# Patient Record
Sex: Male | Born: 1940
Health system: Southern US, Community
[De-identification: ages and names within clinical notes are randomized; demographics above are authoritative.]

## PROBLEM LIST (undated history)

## (undated) DIAGNOSIS — G43909 Migraine, unspecified, not intractable, without status migrainosus: Secondary | ICD-10-CM

## (undated) DIAGNOSIS — E119 Type 2 diabetes mellitus without complications: Secondary | ICD-10-CM

## (undated) DIAGNOSIS — E559 Vitamin D deficiency, unspecified: Secondary | ICD-10-CM

## (undated) DIAGNOSIS — M1990 Unspecified osteoarthritis, unspecified site: Secondary | ICD-10-CM

## (undated) DIAGNOSIS — G4730 Sleep apnea, unspecified: Secondary | ICD-10-CM

## (undated) DIAGNOSIS — I509 Heart failure, unspecified: Secondary | ICD-10-CM

## (undated) DIAGNOSIS — I071 Rheumatic tricuspid insufficiency: Secondary | ICD-10-CM

## (undated) DIAGNOSIS — E7800 Pure hypercholesterolemia, unspecified: Secondary | ICD-10-CM

## (undated) DIAGNOSIS — I2510 Atherosclerotic heart disease of native coronary artery without angina pectoris: Secondary | ICD-10-CM

## (undated) DIAGNOSIS — Z9989 Dependence on other enabling machines and devices: Secondary | ICD-10-CM

## (undated) DIAGNOSIS — K635 Polyp of colon: Secondary | ICD-10-CM

## (undated) DIAGNOSIS — K760 Fatty (change of) liver, not elsewhere classified: Secondary | ICD-10-CM

## (undated) DIAGNOSIS — N400 Enlarged prostate without lower urinary tract symptoms: Secondary | ICD-10-CM

## (undated) DIAGNOSIS — D509 Iron deficiency anemia, unspecified: Secondary | ICD-10-CM

## (undated) DIAGNOSIS — I4891 Unspecified atrial fibrillation: Secondary | ICD-10-CM

## (undated) DIAGNOSIS — I700 Atherosclerosis of aorta: Secondary | ICD-10-CM

## (undated) DIAGNOSIS — I10 Essential (primary) hypertension: Secondary | ICD-10-CM

## (undated) DIAGNOSIS — I714 Abdominal aortic aneurysm, without rupture, unspecified: Secondary | ICD-10-CM

## (undated) DIAGNOSIS — Z923 Personal history of irradiation: Secondary | ICD-10-CM

## (undated) DIAGNOSIS — I739 Peripheral vascular disease, unspecified: Secondary | ICD-10-CM

## (undated) DIAGNOSIS — M109 Gout, unspecified: Secondary | ICD-10-CM

## (undated) DIAGNOSIS — K219 Gastro-esophageal reflux disease without esophagitis: Secondary | ICD-10-CM

## (undated) DIAGNOSIS — I499 Cardiac arrhythmia, unspecified: Secondary | ICD-10-CM

## (undated) DIAGNOSIS — R011 Cardiac murmur, unspecified: Secondary | ICD-10-CM

## (undated) DIAGNOSIS — N200 Calculus of kidney: Secondary | ICD-10-CM

## (undated) DIAGNOSIS — M069 Rheumatoid arthritis, unspecified: Secondary | ICD-10-CM

## (undated) DIAGNOSIS — Z7901 Long term (current) use of anticoagulants: Secondary | ICD-10-CM

## (undated) DIAGNOSIS — Z950 Presence of cardiac pacemaker: Secondary | ICD-10-CM

## (undated) DIAGNOSIS — F419 Anxiety disorder, unspecified: Secondary | ICD-10-CM

## (undated) DIAGNOSIS — K5790 Diverticulosis of intestine, part unspecified, without perforation or abscess without bleeding: Secondary | ICD-10-CM

## (undated) DIAGNOSIS — N189 Chronic kidney disease, unspecified: Secondary | ICD-10-CM

## (undated) DIAGNOSIS — N281 Cyst of kidney, acquired: Secondary | ICD-10-CM

## (undated) DIAGNOSIS — N183 Chronic kidney disease, stage 3 unspecified: Secondary | ICD-10-CM

## (undated) DIAGNOSIS — N529 Male erectile dysfunction, unspecified: Secondary | ICD-10-CM

## (undated) DIAGNOSIS — C801 Malignant (primary) neoplasm, unspecified: Secondary | ICD-10-CM

## (undated) DIAGNOSIS — C9000 Multiple myeloma not having achieved remission: Secondary | ICD-10-CM

## (undated) DIAGNOSIS — I5189 Other ill-defined heart diseases: Secondary | ICD-10-CM

## (undated) DIAGNOSIS — G4733 Obstructive sleep apnea (adult) (pediatric): Secondary | ICD-10-CM

## (undated) DIAGNOSIS — J449 Chronic obstructive pulmonary disease, unspecified: Secondary | ICD-10-CM

## (undated) DIAGNOSIS — I495 Sick sinus syndrome: Secondary | ICD-10-CM

## (undated) DIAGNOSIS — K759 Inflammatory liver disease, unspecified: Secondary | ICD-10-CM

## (undated) DIAGNOSIS — C61 Malignant neoplasm of prostate: Secondary | ICD-10-CM

## (undated) DIAGNOSIS — K7689 Other specified diseases of liver: Secondary | ICD-10-CM

## (undated) HISTORY — PX: OTHER SURGICAL HISTORY: SHX169

## (undated) HISTORY — PX: TONSILLECTOMY: SUR1361

## (undated) HISTORY — PX: NASAL SINUS SURGERY: SHX719

## (undated) HISTORY — DX: Malignant (primary) neoplasm, unspecified: C80.1

## (undated) HISTORY — DX: Malignant neoplasm of prostate: C61

## (undated) HISTORY — PX: PROSTATE BIOPSY: SHX241

## (undated) HISTORY — DX: Polyp of colon: K63.5

## (undated) SURGERY — EGD (ESOPHAGOGASTRODUODENOSCOPY)
Anesthesia: General

---

## 2009-06-30 ENCOUNTER — Emergency Department: Payer: Self-pay | Admitting: Emergency Medicine

## 2011-02-11 DIAGNOSIS — M5030 Other cervical disc degeneration, unspecified cervical region: Secondary | ICD-10-CM | POA: Insufficient documentation

## 2011-02-11 DIAGNOSIS — M130 Polyarthritis, unspecified: Secondary | ICD-10-CM | POA: Insufficient documentation

## 2013-10-03 ENCOUNTER — Ambulatory Visit: Payer: Self-pay | Admitting: Vascular Surgery

## 2013-10-31 ENCOUNTER — Ambulatory Visit: Payer: Self-pay | Admitting: Vascular Surgery

## 2013-10-31 DIAGNOSIS — I10 Essential (primary) hypertension: Secondary | ICD-10-CM

## 2013-10-31 DIAGNOSIS — I2510 Atherosclerotic heart disease of native coronary artery without angina pectoris: Secondary | ICD-10-CM

## 2013-10-31 LAB — URINALYSIS, COMPLETE
BILIRUBIN, UR: NEGATIVE
Bacteria: NONE SEEN
Blood: NEGATIVE
Glucose,UR: NEGATIVE mg/dL (ref 0–75)
Ketone: NEGATIVE
NITRITE: NEGATIVE
Ph: 5 (ref 4.5–8.0)
Protein: NEGATIVE
RBC,UR: 1 /HPF (ref 0–5)
SQUAMOUS EPITHELIAL: NONE SEEN
Specific Gravity: 1.015 (ref 1.003–1.030)
WBC UR: 9 /HPF (ref 0–5)

## 2013-10-31 LAB — CBC
HCT: 51 % (ref 40.0–52.0)
HGB: 16.9 g/dL (ref 13.0–18.0)
MCH: 32.8 pg (ref 26.0–34.0)
MCHC: 33.1 g/dL (ref 32.0–36.0)
MCV: 99 fL (ref 80–100)
Platelet: 193 10*3/uL (ref 150–440)
RBC: 5.15 10*6/uL (ref 4.40–5.90)
RDW: 15.6 % — ABNORMAL HIGH (ref 11.5–14.5)
WBC: 8.8 10*3/uL (ref 3.8–10.6)

## 2013-10-31 LAB — BASIC METABOLIC PANEL
Anion Gap: 3 — ABNORMAL LOW (ref 7–16)
BUN: 19 mg/dL — ABNORMAL HIGH (ref 7–18)
CALCIUM: 8.7 mg/dL (ref 8.5–10.1)
Chloride: 109 mmol/L — ABNORMAL HIGH (ref 98–107)
Co2: 27 mmol/L (ref 21–32)
Creatinine: 1.37 mg/dL — ABNORMAL HIGH (ref 0.60–1.30)
GFR CALC AF AMER: 60 — AB
GFR CALC NON AF AMER: 52 — AB
Glucose: 82 mg/dL (ref 65–99)
Osmolality: 279 (ref 275–301)
POTASSIUM: 4.1 mmol/L (ref 3.5–5.1)
Sodium: 139 mmol/L (ref 136–145)

## 2013-11-05 HISTORY — PX: ABDOMINAL AORTIC ANEURYSM REPAIR: SUR1152

## 2013-11-07 ENCOUNTER — Inpatient Hospital Stay: Payer: Self-pay | Admitting: Vascular Surgery

## 2013-11-08 LAB — CBC WITH DIFFERENTIAL/PLATELET
Basophil #: 0 10*3/uL (ref 0.0–0.1)
Basophil %: 0.2 %
Eosinophil #: 0.2 10*3/uL (ref 0.0–0.7)
Eosinophil %: 1.7 %
HCT: 45 % (ref 40.0–52.0)
HGB: 15 g/dL (ref 13.0–18.0)
LYMPHS ABS: 1.2 10*3/uL (ref 1.0–3.6)
Lymphocyte %: 10.6 %
MCH: 33.2 pg (ref 26.0–34.0)
MCHC: 33.2 g/dL (ref 32.0–36.0)
MCV: 100 fL (ref 80–100)
MONOS PCT: 9.1 %
Monocyte #: 1 x10 3/mm (ref 0.2–1.0)
NEUTROS ABS: 8.8 10*3/uL — AB (ref 1.4–6.5)
NEUTROS PCT: 78.4 %
Platelet: 132 10*3/uL — ABNORMAL LOW (ref 150–440)
RBC: 4.51 10*6/uL (ref 4.40–5.90)
RDW: 14.8 % — ABNORMAL HIGH (ref 11.5–14.5)
WBC: 11.2 10*3/uL — ABNORMAL HIGH (ref 3.8–10.6)

## 2013-11-08 LAB — COMPREHENSIVE METABOLIC PANEL
ALK PHOS: 71 U/L
ALT: 27 U/L (ref 12–78)
AST: 22 U/L (ref 15–37)
Albumin: 3 g/dL — ABNORMAL LOW (ref 3.4–5.0)
Anion Gap: 7 (ref 7–16)
BUN: 13 mg/dL (ref 7–18)
Bilirubin,Total: 0.5 mg/dL (ref 0.2–1.0)
CHLORIDE: 110 mmol/L — AB (ref 98–107)
Calcium, Total: 8.1 mg/dL — ABNORMAL LOW (ref 8.5–10.1)
Co2: 24 mmol/L (ref 21–32)
Creatinine: 1.02 mg/dL (ref 0.60–1.30)
Glucose: 110 mg/dL — ABNORMAL HIGH (ref 65–99)
OSMOLALITY: 282 (ref 275–301)
Potassium: 4 mmol/L (ref 3.5–5.1)
SODIUM: 141 mmol/L (ref 136–145)
Total Protein: 6.4 g/dL (ref 6.4–8.2)

## 2013-11-08 LAB — PHOSPHORUS: Phosphorus: 3.4 mg/dL (ref 2.5–4.9)

## 2013-11-08 LAB — PROTIME-INR
INR: 1.1
PROTHROMBIN TIME: 13.7 s (ref 11.5–14.7)

## 2013-11-08 LAB — APTT: ACTIVATED PTT: 32.4 s (ref 23.6–35.9)

## 2013-11-08 LAB — MAGNESIUM: MAGNESIUM: 1.4 mg/dL — AB

## 2014-01-15 DIAGNOSIS — M069 Rheumatoid arthritis, unspecified: Secondary | ICD-10-CM | POA: Insufficient documentation

## 2014-01-15 DIAGNOSIS — Z8739 Personal history of other diseases of the musculoskeletal system and connective tissue: Secondary | ICD-10-CM | POA: Insufficient documentation

## 2014-01-28 ENCOUNTER — Ambulatory Visit: Payer: Self-pay | Admitting: Rheumatology

## 2014-02-18 ENCOUNTER — Encounter: Payer: Self-pay | Admitting: *Deleted

## 2014-02-19 ENCOUNTER — Encounter: Payer: Self-pay | Admitting: Radiation Oncology

## 2014-02-19 NOTE — Progress Notes (Signed)
GU Location of Tumor / Histology: prostate adenocarcinoma  If Prostate Cancer, Gleason Score is (3 + 3) and PSA is (7.59 on 01/30/14) 09/17/13 PSA 7.65 05/25/13 PSA 8.76  Omar [name] presented  12 months ago with signs/symptoms of: elevated PSA of 7.3  Biopsies of prostate revealed:  02/01/14 volume 41.42 cc    01/01/13  Volume 46.6 cc   Past/Anticipated interventions by urology, if any: surveillance, biopsy x 2  Past/Anticipated interventions by medical oncology, if any: no  Weight changes, if any: no  Bowel/Bladder complaints, if any:  IPSS 10, incomplete;ete emptying, frequency, weak stream, nocturia x 1  Nausea/Vomiting, if any: no  Pain issues, if any:  no  SAFETY ISSUES:  Prior radiation? no  Pacemaker/ICD? no  Possible current pregnancy? na  Is the patient on methotrexate? 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

## 2014-02-20 ENCOUNTER — Encounter: Payer: Self-pay | Admitting: Radiation Oncology

## 2014-02-20 ENCOUNTER — Ambulatory Visit
Admission: RE | Admit: 2014-02-20 | Discharge: 2014-02-20 | Disposition: A | Discharge status: Home / Self Care | Payer: Medicare Other | Source: Ambulatory Visit | Attending: Radiation Oncology | Admitting: Radiation Oncology

## 2014-02-20 VITALS — BP 119/78 | HR 79 | Temp 98.4°F | Resp 20 | Ht 71.0 in | Wt 234.6 lb

## 2014-02-20 DIAGNOSIS — R3911 Hesitancy of micturition: Secondary | ICD-10-CM | POA: Insufficient documentation

## 2014-02-20 DIAGNOSIS — Z79899 Other long term (current) drug therapy: Secondary | ICD-10-CM | POA: Diagnosis not present

## 2014-02-20 DIAGNOSIS — I4891 Unspecified atrial fibrillation: Secondary | ICD-10-CM | POA: Insufficient documentation

## 2014-02-20 DIAGNOSIS — Z791 Long term (current) use of non-steroidal anti-inflammatories (NSAID): Secondary | ICD-10-CM | POA: Insufficient documentation

## 2014-02-20 DIAGNOSIS — R350 Frequency of micturition: Secondary | ICD-10-CM | POA: Insufficient documentation

## 2014-02-20 DIAGNOSIS — Z7982 Long term (current) use of aspirin: Secondary | ICD-10-CM | POA: Diagnosis not present

## 2014-02-20 DIAGNOSIS — C61 Malignant neoplasm of prostate: Secondary | ICD-10-CM | POA: Insufficient documentation

## 2014-02-20 DIAGNOSIS — N529 Male erectile dysfunction, unspecified: Secondary | ICD-10-CM | POA: Insufficient documentation

## 2014-02-20 DIAGNOSIS — I10 Essential (primary) hypertension: Secondary | ICD-10-CM | POA: Insufficient documentation

## 2014-02-20 DIAGNOSIS — M069 Rheumatoid arthritis, unspecified: Secondary | ICD-10-CM | POA: Insufficient documentation

## 2014-02-20 DIAGNOSIS — E7800 Pure hypercholesterolemia, unspecified: Secondary | ICD-10-CM | POA: Insufficient documentation

## 2014-02-20 DIAGNOSIS — F17200 Nicotine dependence, unspecified, uncomplicated: Secondary | ICD-10-CM | POA: Insufficient documentation

## 2014-02-20 DIAGNOSIS — K219 Gastro-esophageal reflux disease without esophagitis: Secondary | ICD-10-CM | POA: Insufficient documentation

## 2014-02-20 DIAGNOSIS — Z510 Encounter for antineoplastic radiation therapy: Secondary | ICD-10-CM | POA: Diagnosis not present

## 2014-02-20 DIAGNOSIS — M47817 Spondylosis without myelopathy or radiculopathy, lumbosacral region: Secondary | ICD-10-CM | POA: Diagnosis not present

## 2014-02-20 DIAGNOSIS — F411 Generalized anxiety disorder: Secondary | ICD-10-CM | POA: Diagnosis not present

## 2014-02-20 HISTORY — DX: Anxiety disorder, unspecified: F41.9

## 2014-02-20 HISTORY — DX: Unspecified atrial fibrillation: I48.91

## 2014-02-20 HISTORY — DX: Sleep apnea, unspecified: G47.30

## 2014-02-20 HISTORY — DX: Essential (primary) hypertension: I10

## 2014-02-20 HISTORY — DX: Unspecified osteoarthritis, unspecified site: M19.90

## 2014-02-20 HISTORY — DX: Cardiac murmur, unspecified: R01.1

## 2014-02-20 HISTORY — DX: Rheumatoid arthritis, unspecified: M06.9

## 2014-02-20 HISTORY — DX: Pure hypercholesterolemia, unspecified: E78.00

## 2014-02-20 HISTORY — DX: Gastro-esophageal reflux disease without esophagitis: K21.9

## 2014-02-20 NOTE — Progress Notes (Signed)
Please see the Nurse Progress Note in the MD Initial Consult Encounter for this patient. 

## 2014-02-20 NOTE — Progress Notes (Signed)
[facility] NEW PATIENT EVALUATION  Name: Omar [name] MRN: [id number]  Date:   02/20/2014           DOB: 10-30-1940  Status: outpatient   CC: No primary provider on file.  Dahlstedt, Lillette Boxer, MD    REFERRING PHYSICIAN: Dahlstedt, Lillette Boxer, MD   DIAGNOSIS: Stage TI C. intermediate risk adenocarcinoma prostate   HISTORY OF PRESENT ILLNESS:  Omar [name] is a 73 y.o. male who is seen today through the courtesy of Dr. Diona Fanti for consideration of radiation therapy in the management of his stage TI C. intermediate risk adenocarcinoma prostate. He presented with a PSA of 7.3 and underwent prostate biopsies on 01/01/2013. His prostate volume was 46.6 cc. Biopsies came back positive for adenocarcinoma, all Gleason 6 (3+3). He had 5% involvement of one core from left lateral base, 50% involvement of one core from left lateral mid gland and 5% of one core from the right lateral apex. He elected for observation. A followup PSA was 7.6 on 01/30/2014.  Repeat biopsies on 01/30/2014 revealed Gleason 7 (3+4) involving 5% of one core from the left base, 90% of one core from the left lateral mid gland and 30% of one core from the left lateral apex. He also had Gleason 6 (3+3) involving 10% of one core from left lateral base. His gland volume is 41.4 cc. He is now interested in potential curative therapy. He is doing recently well from a GU and GI standpoint. His I PSS score today is 10. He does report urinary hesitancy and frequency. He does have erectile dysfunction which in the past did not respond well to PDE 5 inhibitors. He seen today with his daughter. Of note is that he did have a recent MRI scan of his lumbar spine for back pain on 01/28/2014. He was found to have moderately severe central canal stenosis with spondylosis at the L4-5 level. No evidence for metastatic disease.   PREVIOUS RADIATION THERAPY: No   PAST MEDICAL HISTORY:  has a past medical history of  Anxiety; GERD (gastroesophageal reflux disease); Hypertension; Heart murmur; Hypercholesterolemia; Sleep apnea; Prostate cancer (01/01/13, 01/30/14); Arthritis; Atrial fibrillation; and Rheumatoid arthritis.     PAST SURGICAL HISTORY:  Past Surgical History  Procedure Laterality Date  . Tonsillectomy    . Prostate biopsy  01/01/13, 01/30/14    Gleason 3+3=6, vol 46.6 cc  . Abdominal aortic aneurysm repair  11/2013     FAMILY HISTORY: family history includes Cancer in his brother; Cirrhosis in his father; Heart attack in his mother. His mother died of heart attack in her 29s. His father died from alcoholic cirrhosis and his 72s. No family history of prostate cancer.   SOCIAL HISTORY:  reports that he has been smoking.  He does not have any smokeless tobacco history on file. He reports that he drinks alcohol. He reports that he does not use illicit drugs. Divorced, 5 children. 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   ALLERGIES: Review of patient's allergies indicates no known allergies.   MEDICATIONS:  Current Outpatient Prescriptions  Medication Sig Dispense Refill  . ALPRAZolam (XANAX) 0.25 MG tablet Take 0.25 mg by mouth at bedtime as needed for anxiety.      Marland Kitchen amLODipine-benazepril (LOTREL) 5-20 MG per capsule Take 1 capsule by mouth daily.      Marland Kitchen aspirin 81 MG tablet Take 81 mg by mouth daily.      Marland Kitchen atorvastatin (LIPITOR) 80 MG tablet Take  80 mg by mouth daily.      . busPIRone (BUSPAR) 5 MG tablet Take 5 mg by mouth 3 (three) times daily.      . Butalbital-Acetaminophen (BUPAP) 50-300 MG TABS Take by mouth.      . celecoxib (CELEBREX) 200 MG capsule Take 200 mg by mouth 2 (two) times daily.      . cyclobenzaprine (FLEXERIL) 5 MG tablet Take 5 mg by mouth 3 (three) times daily as needed for muscle spasms.      . fluticasone (FLONASE) 50 MCG/ACT nasal spray Place into both nostrils daily.      . Fluticasone Furoate-Vilanterol (BREO ELLIPTA) 100-25 MCG/INH AEPB Inhale into the lungs.       . folic acid (FOLVITE) 1 MG tablet Take 1 mg by mouth daily.      . isosorbide mononitrate (IMDUR) 30 MG 24 hr tablet Take 30 mg by mouth daily.      . methotrexate (RHEUMATREX) 2.5 MG tablet Take by mouth.      . metoprolol succinate (TOPROL-XL) 25 MG 24 hr tablet Take 25 mg by mouth daily.      . pantoprazole (PROTONIX) 20 MG tablet Take 20 mg by mouth daily.       No current facility-administered medications for this encounter.     REVIEW OF SYSTEMS:  Pertinent items are noted in HPI.    PHYSICAL EXAM:  height is 5\' 11"  (1.803 m) and weight is 234 lb 9.6 oz (106.414 kg). His oral temperature is 98.4 F (36.9 C). His blood pressure is 119/78 and his pulse is 79. His respiration is 20.   Alert and oriented 73 year old African American male appearing his stated age. Head and neck examination: Grossly unremarkable. Nodes: Without palpable cervical or supraclavicular lymphadenopathy. Chest: Lungs clear. Back: Without spinal or CVA tenderness. Abdomen: Without hepatomegaly. Genitalia: Unremarkable to inspection. Rectal: The prostate gland is normal in size and is without focal induration or nodularity. Extremities: Without edema. Skin: He appears to have vitiligo.   LABORATORY DATA:  No results found for this basename: WBC, HGB, HCT, MCV, PLT   No results found for this basename: NA, K, CL, CO2   No results found for this basename: ALT, AST, GGT, ALKPHOS, BILITOT   PSA 7.59 from 01/30/2014   IMPRESSION: Stage TI C. intermediate risk adenocarcinoma prostate. I explained to the patient and his daughter that his prognosis is related to his stage, PSA level, and Gleason score. His stage and PSA level are favorable while his Gleason score of 7 is of intermediate favorability. We discussed surgery versus continued active surveillance versus radiation therapy. Radiation therapy options include seed implantation with or without 5 weeks of external beam or 8 weeks of external beam/IMRT. With 3  biopsy sites for Gleason 7, I do not feel that seed implantation alone would be ideal. He may be better suited for 8 weeks of external beam/IMRT considering his age and pathologic findings. We discussed the potential acute and late toxicities of radiation therapy. After lengthy discussion he is most interested in 8 weeks of external beam/IMRT compared to 5 weeks of external beam followed by a seed implant boost. We talked about treatment with a comfortably full bladder to reduce urinary toxicity. Consent is signed today. We need to have Dr. Diona Fanti place 3 gold seed markers for image guidance, and then we can have him return for CT simulation.   PLAN: As discussed above  I spent 60 minutes face to face with the patient  and more than 50% of that time was spent in counseling and/or coordination of care.

## 2014-02-21 ENCOUNTER — Telehealth: Payer: Self-pay | Admitting: *Deleted

## 2014-02-21 NOTE — Telephone Encounter (Signed)
CALLED PATIENT TO INFORM OF GOLD SEED PLACEMENT DATE OF 04-17-14 - ARRIVAL TIME 2:45 PM @ DR. DAHLSTEDT'S OFFICE, I HAVE LVM A MESSAAGE FOR DR. DAHLSTEDT'S NURSE TO GIVE ME A CALL TO SEE IF I CAN GET AN EARLIER DATE, LVM FOR A RETURN CALL

## 2014-02-25 ENCOUNTER — Telehealth: Payer: Self-pay | Admitting: *Deleted

## 2014-02-25 NOTE — Telephone Encounter (Signed)
Called patient to inform of gold seed placement on 03-21-14 - arrival time - 9:15 am @ Dr. Alan Ripper Office and his sim on 03-25-14 @ 9 am @ Dr. Charlton Amor Office, lvm for a return call

## 2014-03-22 ENCOUNTER — Telehealth: Payer: Self-pay | Admitting: *Deleted

## 2014-03-22 NOTE — Telephone Encounter (Signed)
CALLED PATIENT TO REMIND OF SIM APPT. FOR 03-25-14 @ 9 AM, LVM FOR A RETURN CALL

## 2014-03-25 ENCOUNTER — Ambulatory Visit
Admission: RE | Admit: 2014-03-25 | Discharge: 2014-03-25 | Disposition: A | Discharge status: Home / Self Care | Payer: Medicare Other | Source: Ambulatory Visit | Attending: Radiation Oncology | Admitting: Radiation Oncology

## 2014-03-25 DIAGNOSIS — Z510 Encounter for antineoplastic radiation therapy: Secondary | ICD-10-CM | POA: Insufficient documentation

## 2014-03-25 DIAGNOSIS — C61 Malignant neoplasm of prostate: Secondary | ICD-10-CM

## 2014-03-25 NOTE — Progress Notes (Signed)
Complex simulation/treatment planning note: The patient was taken to the CT simulator. He was placed supine. A VAC LOC immobilization device was constructed. A red rubber catheter was placed within the rectal vault. He was then catheterized and contrast instilled into the bladder/urethra. He was then scanned. I chose an isocenter in the center of the prostate. The CT data set was sent to the MIM planning system I contoured his prostate (GTV), seminal vesicles, bladder, rectum, and distal rectosigmoid colon. I am prescribing 7800 cGy to his prostate PTV which represents the prostate was 0.8 cm except for 0.5 cm along the rectum. I prescribing 5600 cGy in 40 sessions to his seminal vesicle PTV which  represents his seminal vesicles plus 0.5 cm. He is now ready for IMRT simulation/treatment planning.

## 2014-03-28 DIAGNOSIS — Z510 Encounter for antineoplastic radiation therapy: Secondary | ICD-10-CM | POA: Diagnosis not present

## 2014-04-02 ENCOUNTER — Encounter: Payer: Self-pay | Admitting: Radiation Oncology

## 2014-04-02 DIAGNOSIS — Z510 Encounter for antineoplastic radiation therapy: Secondary | ICD-10-CM | POA: Diagnosis not present

## 2014-04-02 NOTE — Progress Notes (Signed)
IMRT simulation/treatment planning note: The patient completed his IMRT treatment planning today and the management of his carcinoma of the prostate. IMRT was chosen to decrease the risk for both acute and late bladder and rectal toxicity compared to conventional or 3-D conformal radiation therapy. Dose volume histograms were obtained for the target structures including the prostate PTV and seminal vesicle PTV in addition to avoidance structures including the bladder, rectum, and femoral heads. We met our departmental guidelines. I'm prescribing 7800 cGy in 40 sessions to his prostate PTV and 5600 cGy in 40 sessions to his seminal vesical PTV. He is being treated with dual arc VMAT IMRT.

## 2014-04-03 ENCOUNTER — Ambulatory Visit
Admission: RE | Admit: 2014-04-03 | Discharge: 2014-04-03 | Disposition: A | Discharge status: Home / Self Care | Payer: Medicare Other | Source: Ambulatory Visit | Attending: Radiation Oncology | Admitting: Radiation Oncology

## 2014-04-03 DIAGNOSIS — C61 Malignant neoplasm of prostate: Secondary | ICD-10-CM

## 2014-04-03 DIAGNOSIS — Z510 Encounter for antineoplastic radiation therapy: Secondary | ICD-10-CM | POA: Diagnosis not present

## 2014-04-03 NOTE — Progress Notes (Signed)
Patient education completed with patient , wife and daughter. Gave him "Radiation and You" booklet with all pertinent information marked and discussed, re: rectal irritation/care, fatigue, urinary/bladder irritation/management, nutrition, pain. All questions answered; pt and family verbalized understanding.

## 2014-04-04 ENCOUNTER — Ambulatory Visit
Admission: RE | Admit: 2014-04-04 | Discharge: 2014-04-04 | Disposition: A | Discharge status: Home / Self Care | Payer: Medicare Other | Source: Ambulatory Visit | Attending: Radiation Oncology | Admitting: Radiation Oncology

## 2014-04-04 DIAGNOSIS — Z510 Encounter for antineoplastic radiation therapy: Secondary | ICD-10-CM | POA: Diagnosis not present

## 2014-04-05 ENCOUNTER — Ambulatory Visit
Admission: RE | Admit: 2014-04-05 | Discharge: 2014-04-05 | Disposition: A | Discharge status: Home / Self Care | Payer: Medicare Other | Source: Ambulatory Visit | Attending: Radiation Oncology | Admitting: Radiation Oncology

## 2014-04-05 DIAGNOSIS — Z510 Encounter for antineoplastic radiation therapy: Secondary | ICD-10-CM | POA: Diagnosis not present

## 2014-04-08 ENCOUNTER — Inpatient Hospital Stay
Admission: RE | Admit: 2014-04-08 | Discharge: 2014-04-08 | Disposition: A | Discharge status: Home / Self Care | Payer: Self-pay | Source: Ambulatory Visit | Attending: Radiation Oncology | Admitting: Radiation Oncology

## 2014-04-08 ENCOUNTER — Ambulatory Visit
Admission: RE | Admit: 2014-04-08 | Discharge: 2014-04-08 | Disposition: A | Discharge status: Home / Self Care | Payer: Medicare Other | Source: Ambulatory Visit | Attending: Radiation Oncology | Admitting: Radiation Oncology

## 2014-04-08 ENCOUNTER — Encounter: Payer: Self-pay | Admitting: Radiation Oncology

## 2014-04-08 VITALS — BP 125/68 | HR 49 | Resp 20 | Wt 239.0 lb

## 2014-04-08 DIAGNOSIS — C61 Malignant neoplasm of prostate: Secondary | ICD-10-CM

## 2014-04-08 DIAGNOSIS — Z510 Encounter for antineoplastic radiation therapy: Secondary | ICD-10-CM | POA: Diagnosis not present

## 2014-04-08 NOTE — Progress Notes (Signed)
Weekly Management Note:  Site:prostate Current Dose:  780  cGy Projected Dose: 7800  cGy  Narrative: The patient is seen today for routine under treatment assessment. CBCT/MVCT images/port films were reviewed. The chart was reviewed.   Bladder filling is satisfactory. No new GU or GI difficulties.  Physical Examination:  Filed Vitals:   04/08/14 0930  BP: 125/68  Pulse: 49  Resp: 20  .  Weight: 239 lb (108.41 kg). No change.  Impression: Tolerating radiation therapy well.  Plan: Continue radiation therapy as planned.

## 2014-04-08 NOTE — Progress Notes (Signed)
Patient denies pain, fatigue, loss of appetite, bowel issues. He states he "may have some increased urinary frequency". Pt states he is still smoking but wants to quit. Gave him [facility] support team Nov 2015 calendar with smoking cessation info marked. Also gave him 1-800-quit now as an alternative for assistance.

## 2014-04-09 ENCOUNTER — Ambulatory Visit: Admission: RE | Admit: 2014-04-09 | Payer: Medicare Other | Source: Ambulatory Visit

## 2014-04-10 ENCOUNTER — Ambulatory Visit
Admission: RE | Admit: 2014-04-10 | Discharge: 2014-04-10 | Disposition: A | Discharge status: Home / Self Care | Payer: Medicare Other | Source: Ambulatory Visit | Attending: Radiation Oncology | Admitting: Radiation Oncology

## 2014-04-10 DIAGNOSIS — Z510 Encounter for antineoplastic radiation therapy: Secondary | ICD-10-CM | POA: Diagnosis not present

## 2014-04-11 ENCOUNTER — Ambulatory Visit
Admission: RE | Admit: 2014-04-11 | Discharge: 2014-04-11 | Disposition: A | Discharge status: Home / Self Care | Payer: Medicare Other | Source: Ambulatory Visit | Attending: Radiation Oncology | Admitting: Radiation Oncology

## 2014-04-11 DIAGNOSIS — Z510 Encounter for antineoplastic radiation therapy: Secondary | ICD-10-CM | POA: Diagnosis not present

## 2014-04-12 ENCOUNTER — Ambulatory Visit
Admission: RE | Admit: 2014-04-12 | Discharge: 2014-04-12 | Disposition: A | Discharge status: Home / Self Care | Payer: Medicare Other | Source: Ambulatory Visit | Attending: Radiation Oncology | Admitting: Radiation Oncology

## 2014-04-12 DIAGNOSIS — Z510 Encounter for antineoplastic radiation therapy: Secondary | ICD-10-CM | POA: Diagnosis not present

## 2014-04-15 ENCOUNTER — Encounter: Payer: Self-pay | Admitting: Radiation Oncology

## 2014-04-15 ENCOUNTER — Ambulatory Visit
Admission: RE | Admit: 2014-04-15 | Discharge: 2014-04-15 | Disposition: A | Discharge status: Home / Self Care | Payer: Medicare Other | Source: Ambulatory Visit | Attending: Radiation Oncology | Admitting: Radiation Oncology

## 2014-04-15 VITALS — BP 126/66 | HR 49 | Temp 98.2°F | Resp 16 | Ht 71.0 in | Wt 236.1 lb

## 2014-04-15 DIAGNOSIS — Z510 Encounter for antineoplastic radiation therapy: Secondary | ICD-10-CM | POA: Diagnosis not present

## 2014-04-15 DIAGNOSIS — C61 Malignant neoplasm of prostate: Secondary | ICD-10-CM

## 2014-04-15 NOTE — Progress Notes (Signed)
Omar [name] has completed 8 fractions to his prostate.  He denies pain.  He reports a slight increase in urinary frequency.  He reports getting up 1 time per night to urinate.   He reports difficulty starting his urinary stream first thing in the morning.  This started 1-2 weeks ago.  He denies dysuria, hematuria, diarrhea, skin irritation and fatigue.  His HR today was 49.  He is taking metoprolol and denies any dizziness.

## 2014-04-15 NOTE — Progress Notes (Signed)
   Weekly Management Note:  outpatient    ICD-9-CM ICD-10-CM   1. Malignant neoplasm of prostate 185 C61     Current Dose:  15.6 Gy  Projected Dose: 78 Gy   Narrative:  The patient presents for routine under treatment assessment.  CBCT/MVCT images/Port film x-rays were reviewed.  The chart was checked. No new complaints  Physical Findings:  height is 5\' 11"  (1.803 m) and weight is 236 lb 1.6 oz (107.094 kg). His oral temperature is 98.2 F (36.8 C). His blood pressure is 126/66 and his pulse is 49. His respiration is 16.  NAD  Impression:  The patient is tolerating radiotherapy.  Plan:  Continue radiotherapy as planned.  ________________________________   Eppie Gibson, M.D.

## 2014-04-16 ENCOUNTER — Ambulatory Visit
Admission: RE | Admit: 2014-04-16 | Discharge: 2014-04-16 | Disposition: A | Discharge status: Home / Self Care | Payer: Medicare Other | Source: Ambulatory Visit | Attending: Radiation Oncology | Admitting: Radiation Oncology

## 2014-04-16 DIAGNOSIS — Z510 Encounter for antineoplastic radiation therapy: Secondary | ICD-10-CM | POA: Diagnosis not present

## 2014-04-17 ENCOUNTER — Ambulatory Visit
Admission: RE | Admit: 2014-04-17 | Discharge: 2014-04-17 | Disposition: A | Discharge status: Home / Self Care | Payer: Medicare Other | Source: Ambulatory Visit | Attending: Radiation Oncology | Admitting: Radiation Oncology

## 2014-04-17 DIAGNOSIS — Z510 Encounter for antineoplastic radiation therapy: Secondary | ICD-10-CM | POA: Diagnosis not present

## 2014-04-18 ENCOUNTER — Ambulatory Visit
Admission: RE | Admit: 2014-04-18 | Discharge: 2014-04-18 | Disposition: A | Discharge status: Home / Self Care | Payer: Medicare Other | Source: Ambulatory Visit | Attending: Radiation Oncology | Admitting: Radiation Oncology

## 2014-04-18 DIAGNOSIS — Z510 Encounter for antineoplastic radiation therapy: Secondary | ICD-10-CM | POA: Diagnosis not present

## 2014-04-19 ENCOUNTER — Ambulatory Visit
Admission: RE | Admit: 2014-04-19 | Discharge: 2014-04-19 | Disposition: A | Discharge status: Home / Self Care | Payer: Medicare Other | Source: Ambulatory Visit | Attending: Radiation Oncology | Admitting: Radiation Oncology

## 2014-04-19 DIAGNOSIS — Z510 Encounter for antineoplastic radiation therapy: Secondary | ICD-10-CM | POA: Diagnosis not present

## 2014-04-22 ENCOUNTER — Ambulatory Visit
Admission: RE | Admit: 2014-04-22 | Discharge: 2014-04-22 | Disposition: A | Discharge status: Home / Self Care | Payer: Medicare Other | Source: Ambulatory Visit | Attending: Radiation Oncology | Admitting: Radiation Oncology

## 2014-04-22 ENCOUNTER — Encounter: Payer: Self-pay | Admitting: Radiation Oncology

## 2014-04-22 VITALS — BP 139/71 | HR 62 | Temp 97.5°F | Resp 20 | Wt 236.8 lb

## 2014-04-22 DIAGNOSIS — C61 Malignant neoplasm of prostate: Secondary | ICD-10-CM

## 2014-04-22 DIAGNOSIS — Z510 Encounter for antineoplastic radiation therapy: Secondary | ICD-10-CM | POA: Diagnosis not present

## 2014-04-22 NOTE — Progress Notes (Signed)
Weekly Management Note:  Site:prostate Current Dose:  3835  cGy Projected Dose: 7800  cGy  Narrative: The patient is seen today for routine under treatment assessment. CBCT/MVCT images/port films were reviewed. The chart was reviewed.   Prostate filling is satisfactory. He does report some slowing of his urinary stream and difficulty emptying his bladder. He is not on an alpha blocker.  Physical Examination:  Filed Vitals:   04/22/14 1052  BP: 139/71  Pulse: 62  Temp: 97.5 F (36.4 C)  Resp: 20  .  Weight: 236 lb 12.8 oz (107.412 kg). No change.  Impression: Tolerating radiation therapy well, except for some slowing of his urinary stream. We will hold off on starting an alpha blocker this week.  Plan: Continue radiation therapy as planned.

## 2014-04-22 NOTE — Progress Notes (Signed)
Patient denies pain, fatigue, loss of appetite, bowel issues. He states he has noticed that in mornings he has difficulty initiating his stream. He denies dysuria, straining, weak stream , incomplete emptying.

## 2014-04-23 ENCOUNTER — Ambulatory Visit
Admission: RE | Admit: 2014-04-23 | Discharge: 2014-04-23 | Disposition: A | Discharge status: Home / Self Care | Payer: Medicare Other | Source: Ambulatory Visit | Attending: Radiation Oncology | Admitting: Radiation Oncology

## 2014-04-23 DIAGNOSIS — Z510 Encounter for antineoplastic radiation therapy: Secondary | ICD-10-CM | POA: Diagnosis not present

## 2014-04-24 ENCOUNTER — Ambulatory Visit
Admission: RE | Admit: 2014-04-24 | Discharge: 2014-04-24 | Disposition: A | Discharge status: Home / Self Care | Payer: Medicare Other | Source: Ambulatory Visit | Attending: Radiation Oncology | Admitting: Radiation Oncology

## 2014-04-24 DIAGNOSIS — Z510 Encounter for antineoplastic radiation therapy: Secondary | ICD-10-CM | POA: Diagnosis not present

## 2014-04-25 ENCOUNTER — Ambulatory Visit
Admission: RE | Admit: 2014-04-25 | Discharge: 2014-04-25 | Disposition: A | Discharge status: Home / Self Care | Payer: Medicare Other | Source: Ambulatory Visit | Attending: Radiation Oncology | Admitting: Radiation Oncology

## 2014-04-25 DIAGNOSIS — Z510 Encounter for antineoplastic radiation therapy: Secondary | ICD-10-CM | POA: Diagnosis not present

## 2014-04-26 ENCOUNTER — Ambulatory Visit
Admission: RE | Admit: 2014-04-26 | Discharge: 2014-04-26 | Disposition: A | Discharge status: Home / Self Care | Payer: Medicare Other | Source: Ambulatory Visit | Attending: Radiation Oncology | Admitting: Radiation Oncology

## 2014-04-26 DIAGNOSIS — Z510 Encounter for antineoplastic radiation therapy: Secondary | ICD-10-CM | POA: Diagnosis not present

## 2014-04-28 ENCOUNTER — Ambulatory Visit
Admission: RE | Admit: 2014-04-28 | Discharge: 2014-04-28 | Disposition: A | Discharge status: Home / Self Care | Payer: Medicare Other | Source: Ambulatory Visit | Attending: Radiation Oncology | Admitting: Radiation Oncology

## 2014-04-28 DIAGNOSIS — Z510 Encounter for antineoplastic radiation therapy: Secondary | ICD-10-CM | POA: Diagnosis not present

## 2014-04-29 ENCOUNTER — Ambulatory Visit
Admission: RE | Admit: 2014-04-29 | Discharge: 2014-04-29 | Disposition: A | Discharge status: Home / Self Care | Payer: Medicare Other | Source: Ambulatory Visit | Attending: Radiation Oncology | Admitting: Radiation Oncology

## 2014-04-29 VITALS — BP 141/72 | HR 62 | Temp 97.4°F | Wt 236.3 lb

## 2014-04-29 DIAGNOSIS — Z510 Encounter for antineoplastic radiation therapy: Secondary | ICD-10-CM | POA: Diagnosis not present

## 2014-04-29 DIAGNOSIS — C61 Malignant neoplasm of prostate: Secondary | ICD-10-CM

## 2014-04-29 NOTE — Progress Notes (Signed)
Weekly Management Note:  Site: Prostate Current Dose:  3705  cGy Projected Dose: 7800  cGy  Narrative: The patient is seen today for routine under treatment assessment. CBCT/MVCT images/port films were reviewed. The chart was reviewed.   Bladder filling satisfactory.  No significant GU or GI difficulty.  Physical Examination:  Filed Vitals:   04/29/14 1044  BP: 141/72  Pulse: 62  Temp: 97.4 F (36.3 C)  .  Weight: 236 lb 4.8 oz (107.185 kg).  No change.  Impression: Tolerating radiation therapy well.  Plan: Continue radiation therapy as planned.

## 2014-04-29 NOTE — Progress Notes (Signed)
Patient for weekly assessment of radiation to prostate.denies pain. Completed 19 of 40 treatments.Frequency and urgency of urination.No bowel changes.Mild fatigue.

## 2014-04-29 NOTE — Addendum Note (Signed)
Encounter addended by: Andria Rhein, RN on: 04/29/2014 11:23 AM<BR>     Documentation filed: Demographics Visit

## 2014-04-30 ENCOUNTER — Ambulatory Visit
Admission: RE | Admit: 2014-04-30 | Discharge: 2014-04-30 | Disposition: A | Discharge status: Home / Self Care | Payer: Medicare Other | Source: Ambulatory Visit | Attending: Radiation Oncology | Admitting: Radiation Oncology

## 2014-04-30 DIAGNOSIS — Z510 Encounter for antineoplastic radiation therapy: Secondary | ICD-10-CM | POA: Diagnosis not present

## 2014-05-01 ENCOUNTER — Ambulatory Visit
Admission: RE | Admit: 2014-05-01 | Discharge: 2014-05-01 | Disposition: A | Discharge status: Home / Self Care | Payer: Medicare Other | Source: Ambulatory Visit | Attending: Radiation Oncology | Admitting: Radiation Oncology

## 2014-05-01 DIAGNOSIS — Z510 Encounter for antineoplastic radiation therapy: Secondary | ICD-10-CM | POA: Diagnosis not present

## 2014-05-03 ENCOUNTER — Ambulatory Visit: Payer: Medicare Other

## 2014-05-06 ENCOUNTER — Ambulatory Visit
Admission: RE | Admit: 2014-05-06 | Discharge: 2014-05-06 | Disposition: A | Discharge status: Home / Self Care | Payer: Medicare Other | Source: Ambulatory Visit | Attending: Radiation Oncology | Admitting: Radiation Oncology

## 2014-05-06 ENCOUNTER — Encounter: Payer: Self-pay | Admitting: Radiation Oncology

## 2014-05-06 VITALS — BP 134/71 | HR 61 | Resp 22 | Wt 234.0 lb

## 2014-05-06 DIAGNOSIS — Z510 Encounter for antineoplastic radiation therapy: Secondary | ICD-10-CM | POA: Diagnosis not present

## 2014-05-06 DIAGNOSIS — C61 Malignant neoplasm of prostate: Secondary | ICD-10-CM

## 2014-05-06 NOTE — Progress Notes (Signed)
Weekly Management Note:  Site: Prostate  Current Dose:  4290  cGy Projected Dose: 7800  cGy  Narrative: The patient is seen today for routine under treatment assessment. CBCT/MVCT images/port films were reviewed. The chart was reviewed.   Bladder filling is satisfactory.  No new GU or GI difficulties.  Physical Examination:  Filed Vitals:   05/06/14 1029  BP: 134/71  Pulse: 61  Resp: 22  .  Weight: 234 lb (106.142 kg).  No change.  Impression: Tolerating radiation therapy well.  Plan: Continue radiation therapy as planned.

## 2014-05-06 NOTE — Progress Notes (Signed)
Patient denies pain, urinary/bowel issues, loss of appetite. He states he was fatigued last week.

## 2014-05-07 ENCOUNTER — Ambulatory Visit
Admission: RE | Admit: 2014-05-07 | Discharge: 2014-05-07 | Disposition: A | Discharge status: Home / Self Care | Payer: Medicare Other | Source: Ambulatory Visit | Attending: Radiation Oncology | Admitting: Radiation Oncology

## 2014-05-07 DIAGNOSIS — Z510 Encounter for antineoplastic radiation therapy: Secondary | ICD-10-CM | POA: Diagnosis not present

## 2014-05-08 ENCOUNTER — Ambulatory Visit
Admission: RE | Admit: 2014-05-08 | Discharge: 2014-05-08 | Disposition: A | Discharge status: Home / Self Care | Payer: Medicare Other | Source: Ambulatory Visit | Attending: Radiation Oncology | Admitting: Radiation Oncology

## 2014-05-08 DIAGNOSIS — Z510 Encounter for antineoplastic radiation therapy: Secondary | ICD-10-CM | POA: Diagnosis not present

## 2014-05-09 ENCOUNTER — Ambulatory Visit
Admission: RE | Admit: 2014-05-09 | Discharge: 2014-05-09 | Disposition: A | Discharge status: Home / Self Care | Payer: Medicare Other | Source: Ambulatory Visit | Attending: Radiation Oncology | Admitting: Radiation Oncology

## 2014-05-09 DIAGNOSIS — Z510 Encounter for antineoplastic radiation therapy: Secondary | ICD-10-CM | POA: Diagnosis not present

## 2014-05-10 ENCOUNTER — Ambulatory Visit: Payer: Medicare Other

## 2014-05-13 ENCOUNTER — Ambulatory Visit
Admission: RE | Admit: 2014-05-13 | Discharge: 2014-05-13 | Disposition: A | Discharge status: Home / Self Care | Payer: Medicare Other | Source: Ambulatory Visit | Attending: Radiation Oncology | Admitting: Radiation Oncology

## 2014-05-13 ENCOUNTER — Encounter: Payer: Self-pay | Admitting: Radiation Oncology

## 2014-05-13 VITALS — BP 138/67 | HR 58 | Temp 97.5°F | Resp 20 | Wt 235.6 lb

## 2014-05-13 DIAGNOSIS — C61 Malignant neoplasm of prostate: Secondary | ICD-10-CM

## 2014-05-13 DIAGNOSIS — Z510 Encounter for antineoplastic radiation therapy: Secondary | ICD-10-CM | POA: Diagnosis not present

## 2014-05-13 NOTE — Progress Notes (Signed)
Patient denies pain, fatigue, urinary/bowel issues, loss of appetite.

## 2014-05-13 NOTE — Progress Notes (Signed)
Weekly Management Note:  Site: Prostate Current Dose:  5070  cGy Projected Dose: 7800  cGy  Narrative: The patient is seen today for routine under treatment assessment. CBCT/MVCT images/port films were reviewed. The chart was reviewed.   Bladder filling is satisfactory.  No significant GU or GI difficulties.  Physical Examination:  Filed Vitals:   05/13/14 1039  BP: 138/67  Pulse: 58  Temp: 97.5 F (36.4 C)  Resp: 20  .  Weight: 235 lb 9.6 oz (106.867 kg).  No change.  Impression: Tolerating radiation therapy well.  Plan: Continue radiation therapy as planned.

## 2014-05-14 ENCOUNTER — Ambulatory Visit
Admission: RE | Admit: 2014-05-14 | Discharge: 2014-05-14 | Disposition: A | Discharge status: Home / Self Care | Payer: Medicare Other | Source: Ambulatory Visit | Attending: Radiation Oncology | Admitting: Radiation Oncology

## 2014-05-14 DIAGNOSIS — Z510 Encounter for antineoplastic radiation therapy: Secondary | ICD-10-CM | POA: Diagnosis not present

## 2014-05-15 ENCOUNTER — Ambulatory Visit
Admission: RE | Admit: 2014-05-15 | Discharge: 2014-05-15 | Disposition: A | Discharge status: Home / Self Care | Payer: Medicare Other | Source: Ambulatory Visit | Attending: Radiation Oncology | Admitting: Radiation Oncology

## 2014-05-15 DIAGNOSIS — Z510 Encounter for antineoplastic radiation therapy: Secondary | ICD-10-CM | POA: Diagnosis not present

## 2014-05-16 ENCOUNTER — Ambulatory Visit
Admission: RE | Admit: 2014-05-16 | Discharge: 2014-05-16 | Disposition: A | Discharge status: Home / Self Care | Payer: Medicare Other | Source: Ambulatory Visit | Attending: Radiation Oncology | Admitting: Radiation Oncology

## 2014-05-16 DIAGNOSIS — Z510 Encounter for antineoplastic radiation therapy: Secondary | ICD-10-CM | POA: Diagnosis not present

## 2014-05-17 ENCOUNTER — Ambulatory Visit
Admission: RE | Admit: 2014-05-17 | Discharge: 2014-05-17 | Disposition: A | Discharge status: Home / Self Care | Payer: Medicare Other | Source: Ambulatory Visit | Attending: Radiation Oncology | Admitting: Radiation Oncology

## 2014-05-17 DIAGNOSIS — Z510 Encounter for antineoplastic radiation therapy: Secondary | ICD-10-CM | POA: Diagnosis not present

## 2014-05-20 ENCOUNTER — Ambulatory Visit
Admission: RE | Admit: 2014-05-20 | Discharge: 2014-05-20 | Disposition: A | Discharge status: Home / Self Care | Payer: Medicare Other | Source: Ambulatory Visit | Attending: Radiation Oncology | Admitting: Radiation Oncology

## 2014-05-20 VITALS — BP 135/65 | HR 44 | Temp 97.6°F | Resp 18 | Wt 235.1 lb

## 2014-05-20 DIAGNOSIS — Z510 Encounter for antineoplastic radiation therapy: Secondary | ICD-10-CM | POA: Diagnosis not present

## 2014-05-20 DIAGNOSIS — C61 Malignant neoplasm of prostate: Secondary | ICD-10-CM

## 2014-05-20 NOTE — Progress Notes (Signed)
Patient denies pain, loss of appetite, urinary/bowel issues. He states he has slight fatigue.

## 2014-05-20 NOTE — Progress Notes (Signed)
Weekly Management Note:  Site: Prostate Current Dose:  6045  cGy Projected Dose: 7800  cGy  Narrative: The patient is seen today for routine under treatment assessment. CBCT/MVCT images/port films were reviewed. The chart was reviewed.   Bladder filling is satisfactory.  No new GU or GI difficulties.  Physical Examination:  Filed Vitals:   05/20/14 1053  BP: 135/65  Pulse: 44  Temp: 97.6 F (36.4 C)  Resp: 18  .  Weight: 235 lb 1.6 oz (106.641 kg).  No change.  Impression: Tolerating radiation therapy well.  Plan: Continue radiation therapy as planned.

## 2014-05-21 ENCOUNTER — Ambulatory Visit
Admission: RE | Admit: 2014-05-21 | Discharge: 2014-05-21 | Disposition: A | Discharge status: Home / Self Care | Payer: Medicare Other | Source: Ambulatory Visit | Attending: Radiation Oncology | Admitting: Radiation Oncology

## 2014-05-21 DIAGNOSIS — Z510 Encounter for antineoplastic radiation therapy: Secondary | ICD-10-CM | POA: Diagnosis not present

## 2014-05-22 ENCOUNTER — Ambulatory Visit
Admission: RE | Admit: 2014-05-22 | Discharge: 2014-05-22 | Disposition: A | Discharge status: Home / Self Care | Payer: Medicare Other | Source: Ambulatory Visit | Attending: Radiation Oncology | Admitting: Radiation Oncology

## 2014-05-22 DIAGNOSIS — Z510 Encounter for antineoplastic radiation therapy: Secondary | ICD-10-CM | POA: Diagnosis not present

## 2014-05-23 ENCOUNTER — Ambulatory Visit
Admission: RE | Admit: 2014-05-23 | Discharge: 2014-05-23 | Disposition: A | Discharge status: Home / Self Care | Payer: Medicare Other | Source: Ambulatory Visit | Attending: Radiation Oncology | Admitting: Radiation Oncology

## 2014-05-23 DIAGNOSIS — Z510 Encounter for antineoplastic radiation therapy: Secondary | ICD-10-CM | POA: Diagnosis not present

## 2014-05-24 ENCOUNTER — Ambulatory Visit
Admission: RE | Admit: 2014-05-24 | Discharge: 2014-05-24 | Disposition: A | Discharge status: Home / Self Care | Payer: Medicare Other | Source: Ambulatory Visit | Attending: Radiation Oncology | Admitting: Radiation Oncology

## 2014-05-24 DIAGNOSIS — Z510 Encounter for antineoplastic radiation therapy: Secondary | ICD-10-CM | POA: Diagnosis not present

## 2014-05-27 ENCOUNTER — Ambulatory Visit
Admission: RE | Admit: 2014-05-27 | Discharge: 2014-05-27 | Disposition: A | Discharge status: Home / Self Care | Payer: Medicare Other | Source: Ambulatory Visit | Attending: Radiation Oncology | Admitting: Radiation Oncology

## 2014-05-27 ENCOUNTER — Encounter: Payer: Self-pay | Admitting: Radiation Oncology

## 2014-05-27 VITALS — BP 148/81 | HR 55 | Temp 97.5°F | Resp 20 | Wt 237.4 lb

## 2014-05-27 DIAGNOSIS — C61 Malignant neoplasm of prostate: Secondary | ICD-10-CM

## 2014-05-27 DIAGNOSIS — Z510 Encounter for antineoplastic radiation therapy: Secondary | ICD-10-CM | POA: Diagnosis not present

## 2014-05-27 NOTE — Progress Notes (Signed)
Patient denies pain, fatigue, loss of appetite, urinary or bowel issues.

## 2014-05-27 NOTE — Progress Notes (Signed)
Weekly Management Note:  Site: Prostate Current Dose:  7000 cGy Projected Dose: 7800  cGy  Narrative: The patient is seen today for routine under treatment assessment. CBCT/MVCT images/port films were reviewed. The chart was reviewed.    No new complaints  Physical Examination:  Filed Vitals:   05/27/14 1003  BP: 148/81  Pulse: 55  Temp: 97.5 F (36.4 C)  Resp: 20  .  Weight: 237 lb 6.4 oz (107.684 kg).  NAD  Impression: Tolerating radiation therapy well.  Plan: Continue radiation therapy as planned. -----------------------------------  Eppie Gibson, MD

## 2014-05-28 ENCOUNTER — Ambulatory Visit
Admission: RE | Admit: 2014-05-28 | Discharge: 2014-05-28 | Disposition: A | Discharge status: Home / Self Care | Payer: Medicare Other | Source: Ambulatory Visit | Attending: Radiation Oncology | Admitting: Radiation Oncology

## 2014-05-28 DIAGNOSIS — Z510 Encounter for antineoplastic radiation therapy: Secondary | ICD-10-CM | POA: Diagnosis not present

## 2014-05-29 ENCOUNTER — Ambulatory Visit
Admission: RE | Admit: 2014-05-29 | Discharge: 2014-05-29 | Disposition: A | Discharge status: Home / Self Care | Payer: Medicare Other | Source: Ambulatory Visit | Attending: Radiation Oncology | Admitting: Radiation Oncology

## 2014-05-29 DIAGNOSIS — Z510 Encounter for antineoplastic radiation therapy: Secondary | ICD-10-CM | POA: Diagnosis not present

## 2014-05-30 ENCOUNTER — Ambulatory Visit: Payer: Medicare Other

## 2014-05-31 ENCOUNTER — Ambulatory Visit: Payer: Medicare Other

## 2014-06-03 ENCOUNTER — Ambulatory Visit: Payer: Medicare Other

## 2014-06-03 ENCOUNTER — Encounter: Payer: Self-pay | Admitting: Radiation Oncology

## 2014-06-03 ENCOUNTER — Ambulatory Visit
Admission: RE | Admit: 2014-06-03 | Discharge: 2014-06-03 | Disposition: A | Discharge status: Home / Self Care | Payer: Medicare Other | Source: Ambulatory Visit | Attending: Radiation Oncology | Admitting: Radiation Oncology

## 2014-06-03 VITALS — BP 118/81 | HR 56 | Temp 98.1°F | Ht 71.0 in | Wt 233.9 lb

## 2014-06-03 DIAGNOSIS — C61 Malignant neoplasm of prostate: Secondary | ICD-10-CM

## 2014-06-03 DIAGNOSIS — Z510 Encounter for antineoplastic radiation therapy: Secondary | ICD-10-CM | POA: Diagnosis not present

## 2014-06-03 MED ORDER — TAMSULOSIN HCL 0.4 MG PO CAPS: 0.4000 mg | ORAL_CAPSULE | Freq: Every day | ORAL | Status: DC

## 2014-06-03 NOTE — Progress Notes (Signed)
Weekly Management Note:  Site: Prostate Current Dose:  7605  cGy Projected Dose: 7800  cGy  Narrative: The patient is seen today for routine under treatment assessment. CBCT/MVCT images/port films were reviewed. The chart was reviewed.   Bladder filling is satisfactory.  He is having more difficulty with urinary hesitancy and intermittency stopping and starting of his stream.  He does not feel that he empties his bladder.  No GI difficulties.  Physical Examination:  Filed Vitals:   06/03/14 1055  BP: 118/81  Pulse: 56  Temp: 98.1 F (36.7 C)  .  Weight: 233 lb 14.4 oz (106.096 kg).  No change.  Impression: Tolerating radiation therapy well.  He will finish his radiation therapy tomorrow.  I will start him on tamsulosin.  Plan: Continue radiation therapy as planned.  One-month follow-up visit after completion of radiation therapy.

## 2014-06-03 NOTE — Progress Notes (Signed)
[name] has received 39 fractions to his prostate and completes tomorrow.  He states some intermittent stopping and starting of stream, nocturia x 2.  Denies any dysuria nor rectal irritation.  No diarrhea nor loose stools.

## 2014-06-04 ENCOUNTER — Ambulatory Visit: Payer: Medicare Other

## 2014-06-04 ENCOUNTER — Ambulatory Visit
Admission: RE | Admit: 2014-06-04 | Discharge: 2014-06-04 | Disposition: A | Discharge status: Home / Self Care | Payer: Medicare Other | Source: Ambulatory Visit | Attending: Radiation Oncology | Admitting: Radiation Oncology

## 2014-06-04 DIAGNOSIS — Z510 Encounter for antineoplastic radiation therapy: Secondary | ICD-10-CM | POA: Diagnosis not present

## 2014-06-05 ENCOUNTER — Encounter: Payer: Self-pay | Admitting: Radiation Oncology

## 2014-06-05 ENCOUNTER — Ambulatory Visit: Payer: Medicare Other

## 2014-06-05 NOTE — Progress Notes (Signed)
[facility] End of Treatment Note  Name:Omar [name]  Date: 06/05/2014 PFY:[id number] DOB:[birth year]   Status:outpatient    CC: Volanda Napoleon, MD  Dr. Franchot Gallo  REFERRING PHYSICIAN:  Dr. Franchot Gallo    DIAGNOSIS: Stage TIc intermediate risk adenocarcinoma prostate   INDICATION FOR TREATMENT: Curative   TREATMENT DATES: 04/03/2014 through 06/04/2014                          SITE/DOSE: Prostate 7800 cGy in 40 sessions                           BEAMS/ENERGY:   6 MV photons, dual ARC VMAT IMRT                NARRATIVE:  The patient tolerated his treatment well although he did have some slowing of his urinary stream during his last week of therapy for which I started tamsulosin.                          PLAN: Routine followup in one month. Patient instructed to call if questions or worsening complaints in interim.

## 2014-06-05 NOTE — Progress Notes (Signed)
Chart Note: On 04-03-14 Mr. Guile began his dual arc VMAT IMRT with 2 sets of dynamic MLCs corresponding to one set of IMRT treatment devices [phone]).

## 2014-07-01 ENCOUNTER — Encounter: Payer: Self-pay | Admitting: Radiation Oncology

## 2014-07-02 ENCOUNTER — Ambulatory Visit
Admission: RE | Admit: 2014-07-02 | Discharge: 2014-07-02 | Disposition: A | Discharge status: Home / Self Care | Payer: Medicare Other | Source: Ambulatory Visit | Attending: Radiation Oncology | Admitting: Radiation Oncology

## 2014-07-02 ENCOUNTER — Encounter: Payer: Self-pay | Admitting: Radiation Oncology

## 2014-07-02 VITALS — BP 143/79 | HR 90 | Temp 98.4°F | Resp 20 | Wt 236.0 lb

## 2014-07-02 DIAGNOSIS — C61 Malignant neoplasm of prostate: Secondary | ICD-10-CM

## 2014-07-02 HISTORY — DX: Personal history of irradiation: Z92.3

## 2014-07-02 NOTE — Progress Notes (Signed)
CC: Omar [name]  Follow-up note:  [name] returns today approximately 1 month following completion of external beam/IMRT in the management of his stage TIc intermediate risk adenocarcinoma prostate.  He tells me that he is almost back to his baseline urinary habits.  His urinary stream is improved.  He saw Dr. Diona Fanti on January 18, and he will see him back for a follow-up visit in 3 months and a PSA determination in 6 months.  He was told that he could taper his Flomax.  His examination: Alert and oriented. Filed Vitals:   07/02/14 1030  BP: 143/79  Pulse: 90  Temp: 98.4 F (36.9 C)  Resp: 20   Rectal examination not performed today.  Impression: Satisfactory progress.  Improving urinary status almost back to his baseline habits.  Plan: Follow-up visit with Dr. Diona Fanti as mentioned above.  I've not scheduled the patient for a formal follow-up visit and I ask that Dr. Diona Fanti keep me posted on his progress.

## 2014-07-02 NOTE — Progress Notes (Signed)
Pt was seen at PCP office yesterday for dry cough. He is on Prednisone, Z pack, Allegra and Tussinex. He denies fever, headache, sore throat, rhinorrhea. Patient denies pain, fatigue, loss of appetite, bowel issues. He states his slow urinary stream is improving, no urinary frequency, nocturia x 1. He saw Dr Diona Fanti on 06/24/14 , and he states Dr Diona Fanti instructed him to taper off Flomax. Pt states he will begin to taper this week.

## 2014-09-28 NOTE — Op Note (Signed)
PATIENT NAME:  Omar [name], Omar [name] MR#:  [id number] DATE OF BIRTH:  1940-08-14  DATE OF PROCEDURE:  11/07/2013  PREOPERATIVE DIAGNOSES: 1. Abdominal aortic aneurysm.  2. Cardiac arrhythmias.  3. Hypertension.   POSTOPERATIVE DIAGNOSES: 1. Abdominal aortic aneurysm.  2. Cardiac arrhythmias.  3. Hypertension.   PROCEDURE: These are co-surgeons for Dr. Lucky Cowboy and Dr. Delana Meyer. 1. Ultrasound guidance for vascular access to bilateral femoral arteries, right by Dr. Lucky Cowboy, left by Dr. Delana Meyer.  2. Catheter placement into left femoral artery from right femoral approach by Dr. Lucky Cowboy.  3. Catheter placement into aorta from left femoral approach by Dr. Delana Meyer.  4. Placement of Endologix Powerlink unibody prosthesis using a 25  mm diameter proximal 20 mm diameter limb, co- surgeons.  5. Placement of aortic extension cuff, 28 mm diameter 95 mm length aortic extension cuffs, co- surgeons.  6. ProGlide closure device, right femoral artery by Dr. Lucky Cowboy.  7. StarClose rose left femoral artery by Dr. Delana Meyer.  ANESTHESIA: General.   ESTIMATED BLOOD LOSS: Approximately 25 mL.   FLUOROSCOPY TIME: Approximately 11 minutes and 55 mL of contrast were used.   INDICATION FOR PROCEDURE: This is a 74 year old gentleman with abdominal aortic aneurysm of approximately 5 cm in maximal diameter. He has low back pain, which is chronic. It was felt this was likely not secondary to the aneurysm but that could not be said definitively given the reasonably large size of the aneurysm a repair is planned. He also has significant infrainguinal disease and claudication symptoms and so a Endologix unibody endoprosthesis is planned to facilitate later up and over treatment for his infrainguinal disease if necessary. Risks and benefits were discussed. Informed consent was obtained.   DESCRIPTION OF PROCEDURE: The patient is brought to the vascular suite with the help of our anesthesia colleagues providing a general anesthetic. His groin and  abdomen were sterilely prepped and draped and a sterile surgical field was created. After appropriate surgical timeout and intravenous antibiotics, ultrasound was used to access bilateral femoral arteries. I accessed the right femoral and Dr. Delana Meyer accessed the left femoral artery. Permanent images were recorded for access. The right femoral artery, I placed two ProGlide devices in a Perclose fashion. I then placed the pigtail catheter up for initial aortogram. This gave [place] length and measurements. A Lunderquist wire was placed up the right and the 17 French deployment sheath was placed to the right femoral access.  Dr. Delana Meyer placed an 8 French sheath on the left, and a snare was placed through the 8 French sheath into the aorta. He captured our contralateral wire and catheter and with his traction and my advancing this was fed out through the left femoral artery out the left femoral sheath. We then parked the main body on the aortic bifurcation and deployed the main body, which was a 25 mm diameter proximal 20 mm diameter 30 mm length limbs.  Where Dr. Delana Meyer fed the 0.014 wire up through the contralateral limb catheter, deployed  the left limb, and I completed the deployment of the right limb. We then placed a pigtail catheter up from over the 0.014 wire and performed a magnified aortogram to show the renal arteries. There were two renal arteries on the left with a small polar left renal artery. Given his long infrarenal neck, I elected to save this, and we deployed an aortic extension cuffs, just at the base of this lowest accessory left renal artery.  A 28 mm diameter by 95 mm length aortic extension cuffs,  with suprarenal fixators was placed. I then ballooned junction points and seal zones with the compliant balloon. Dr. Delana Meyer used a 12 mm balloon for the left iliac limb to treat at the bifurcation. At this point, the pigtail catheter was replaced, and completion angiogram was performed which showed  excellent flow through the endoprosthesis with no type I or 3 endoleaks. There was a possible faint type 2 endoleak, although this was an indeterminate. The renal arteries and hypogastric arteries are patent bilaterally. At this point, we elected to terminate the procedure. Dr. Ronalee Belts  performed a StarClose closure device on the left femoral artery with excellent hemostatic result. I secured down the 2 ProGlide in the right femoral artery to close the artery there with an excellent hemostatic result. Both skin incisions were closed with 4-0 Monocryl and Dermabond and pressure dressing was placed. The patient tolerated the procedure well and was taken to the recovery room in stable condition.    ____________________________ Algernon Huxley, MD jsd:sg D: 11/07/2013 09:38:58 ET T: 11/07/2013 10:03:32 ET JOB#: [id number]  cc: Algernon Huxley, MD, <Dictator> Venetia Maxon. Elijio Miles, MD Dionisio David, MD  Algernon Huxley MD ELECTRONICALLY SIGNED 11/16/2013 12:33

## 2014-09-28 NOTE — Op Note (Signed)
PATIENT NAME:  TYRIEK, HOFMAN MR#:  [id number] DATE OF BIRTH:  12-29-1940  DATE OF PROCEDURE:  11/07/2013  PREOPERATIVE DIAGNOSES:  1. Abdominal aortic aneurysm.  2. Hypertension.  3. Atherosclerotic occlusive disease, bilateral lower extremities, with lifestyle limiting claudication.  POSTOPERATIVE DIAGNOSES:  1. Abdominal aortic aneurysm.  2. Hypertension.  3. Atherosclerotic occlusive disease, bilateral lower extremities, with lifestyle limiting claudication.  PROCEDURES PERFORMED:  1. Endovascular repair of abdominal aortic aneurysm using an Endologix endoprosthesis.  2. Introduction catheter into aorta percutaneously, right femoral approach.  3. Introduction catheter into aorta percutaneously, left femoral approach.  4. Closure of right arterial puncture with Perclose in a pre-close fashion.  5. Closure of left femoral puncture with a StarClose device.   PROCEDURE PERFORMED BY: Algernon Huxley, MD, and Katha Cabal, MD, co-surgeons.   ANESTHESIA: General by endotracheal intubation.   FLUIDS: Per anesthesia record.   ESTIMATED BLOOD LOSS: Minimal.   SPECIMEN: None.   FLUOROSCOPY TIME: Approximately 5 minutes.   CONTRAST USED: 55 mL.   INDICATIONS: Mr. Stamour is a 74 year old gentleman who was found to have an abdominal aortic aneurysm greater than 5 cm. It is acceptable for a stent graft repair. Risks, benefits as well as alternatives have been reviewed. All questions have been answered, and the patient has agreed to proceed.   DESCRIPTION OF PROCEDURE: The patient is taken to special procedures and placed in the supine position. After adequate general anesthesia is induced and appropriate invasive monitors are placed, he is positioned supine. He is then prepped and draped in sterile fashion. Appropriate timeout is called.   Ultrasound is placed in a sterile sleeve. With Dr. Lucky Cowboy working on the right side and myself working on the left, right common femoral is  accessed. Ultrasound is utilized. The femoral artery is pulsatile and echolucent, indicating patency. Image is recorded for the permanent record, and a Seldinger needle is inserted. J-wire is advanced, followed by a 6 [place] sheath. Perclose devices are then utilized in a pre-close fashion, passing the first one over the wire and turning it to the 11 o'clock position, and then a second Perclose and turning that to the 1 o'clock. The knots are then tagged with a curved and straight hemostat, respectively, and an 8 [place] sheath is inserted. A J-wire is then advanced into the descending thoracic aorta. Working simultaneously, I accessed the left common femoral artery with a micropuncture needle, microwire, micro sheath, subsequently a J-wire and a 6 [place] sheath. J-wire was then advanced, and an 8 [place] sheath was inserted. Heparin 5000 units was given.   The Amplatz Super Stiff wire was then deployed after angiography was obtained through the pigtail on the right, and a 24 French sheath was then advanced under fluoroscopic guidance from the right. Snare was then advanced from the left. Based on the length measurements from the angiography, the main body 90 x 30 x 20 was then selected. This was a 25 mm diameter main portion, and this was advanced into position in the sheath. The contralateral limb wire was then advanced under fluoroscopy and captured with the snare. Contralateral limb wire was then pulled out the right side. The entire main body was then advanced slowly, keeping tension on the contralateral limb wire until, under fluoroscopy, the entire system was unfurled, and it was verified that there was no wire wrap. The main body was then seated on the bifurcation. Deployment of the main body was then performed by pulling of the control cord. The contralateral  wire was then removed, and a 0.014 wire advanced through the hypotube. The hypotube was then removed, unsheathing the contralateral limb. The  ipsilateral limb was then unsheathed by pulling the inner core back into the sheath.   The nosecone was then docked, and the entire system was advanced through the main body. A pigtail catheter was then advanced up the left side, and magnified imaging of the renals was performed. A 28 diameter by 95 extender aortic extension cuff was then opened onto the field and advanced through the 17 [place] sheath. It was deployed just below the accessory renal on the left without difficulty. Pigtail catheter was then pulled distally until it was within the main body, and a J-wire and pigtail catheter were then advanced up into the aorta.   The Coda balloon was advanced up the right side and a 12 x 4 balloon up the left. Coda balloon was used to seal the proximal and the main body, then working in a kissing fashion, Coda balloon on the right and the 12 balloon on the left, the bifurcation was sealed.   Pigtail catheter was then advanced up the left, and a final image was obtained. There is a faint indeterminate type II endoleak possibly, but there are certainly no type I or type III endoleaks. The graft is in excellent position with rapid flow of contrast, and both hypogastrics have been spared.   Perclose was then used to seal the right, StarClose on the left. There were no complications. Skin was closed with 4-0 Monocryl, and the patient tolerated the procedure well, and there were no immediate complications. He was taken to the recovery room in excellent condition.   ____________________________ Katha Cabal, MD ggs:lb D: 11/07/2013 09:55:44 ET T: 11/07/2013 10:32:46 ET JOB#: [id number]  cc: Katha Cabal, MD, <Dictator> Katha Cabal MD ELECTRONICALLY SIGNED 11/20/2013 9:18

## 2014-10-28 ENCOUNTER — Encounter: Payer: Medicare Other | Attending: Internal Medicine | Admitting: *Deleted

## 2014-10-28 ENCOUNTER — Encounter: Payer: Self-pay | Admitting: *Deleted

## 2014-10-28 VITALS — BP 120/80 | Ht 71.0 in | Wt 237.0 lb

## 2014-10-28 DIAGNOSIS — E119 Type 2 diabetes mellitus without complications: Secondary | ICD-10-CM | POA: Diagnosis present

## 2014-10-28 NOTE — Progress Notes (Signed)
Diabetes Self-Management Education  Visit Type: First/Initial  Appt. Start Time: 1015 Appt. End Time: 1140  10/28/2014  [name], identified by name and date of birth, is a 74 y.o. male with a diagnosis of Diabetes: Type 2.    ASSESSMENT  Blood pressure 120/80, height 5\' 11"  (1.803 m), weight 237 lb (107.502 kg). Body mass index is 33.07 kg/(m^2).  Initial Visit Information:  Are you currently following a meal plan?: No   Are you taking your medications as prescribed?: Yes Are you checking your feet?: Yes How many days per week are you checking your feet?: 7 How often do you need to have someone help you when you read instructions, pamphlets, or other written materials from your doctor or pharmacy?: 1 - Never What is the last grade level you completed in school?: 12  Psychosocial:  Patient Belief/Attitude about Diabetes: Other (comment) (Not ready to accept ) Self-care barriers: None Self-management support: Doctor's office, Internet communities Patient Concerns: Weight Control, Healthy Lifestyle Special Needs: None Preferred Learning Style: Hands on Learning Readiness: Not Ready  Complications:   Last HgB A1C per patient/outside source: 6.5 mg/dL (pt reports) How often do you check your blood sugar?: 1-2 times/day Fasting Blood glucose range (mg/dL): 70-129, 130-179 (He reports FBG's and pp's 120-130's mg/dL) Postprandial Blood glucose range (mg/dL): 70-129, 130-179 (BG today in office per pt's request was 110 mg/dL at 11:35 am - 3 hrs pp) Have you had a dilated eye exam in the past 12 months?: No Have you had a dental exam in the past 12 months?: No  Diet Intake:  Breakfast: usually skips Snack (evening): pt reports snacking all day Beverage(s): drinks 2 cups coffee with 2 tsp sugar  Exercise:  Exercise: ADL's  Individualized Plan for Diabetes Self-Management Training:   Learning Objective:  Patient will have a greater understanding of diabetes  self-management.  Patient education plan per assessed needs and concerns is to attend individual sessions for     Education Topics Reviewed with Patient Today:  Factors that contribute to the development of diabetes Role of diet in the treatment of diabetes and the relationship between the three main macronutrients and blood glucose level Role of exercise on diabetes management, blood pressure control and cardiac health. Reviewed patients medication for diabetes, action, purpose, timing of dose and side effects. Purpose and frequency of SMBG., Identified appropriate SMBG and/or A1C goals., Yearly dilated eye exam Relationship between chronic complications and blood glucose control Review risk of smoking and offered smoking cessation  PATIENTS GOALS/Plan (Developed by the patient): Lose weight Become more fit   Plan:   Patient Instructions  Check blood sugars 2 x day before breakfast and 2 hrs after supper 3-4 x week  Exercise:  walking for    15  minutes     3  days a week  Avoid sugar sweetened drinks (coffee, tea)  Eat 3 meals day,   2  snacks a day Space meals 4-6 hours apart Don't skip meals  Quit smoking  Follow up with MD office   Expected Outcomes:   Pt reports he has received 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  Air traffic controller provided: General Meal Planning Guidelines  If problems or questions, patient to contact team via:   Omar Drilling, RN, [place], CDE [phone]  Future DSME appointment:  None at this time. Referred him to MD office.

## 2014-10-28 NOTE — Patient Instructions (Addendum)
Check blood sugars 2 x day before breakfast and 2 hrs after supper 3-4 x week  Exercise:  walking for    15  minutes     3  days a week  Avoid sugar sweetened drinks (coffee, tea)  Eat 3 meals day,   2  snacks a day Space meals 4-6 hours apart Don't skip meals  Quit smoking  Bring blood sugar records to the next appointment/class  Follow up with MD office

## 2014-12-05 ENCOUNTER — Encounter: Payer: Self-pay | Admitting: Podiatry

## 2014-12-05 ENCOUNTER — Ambulatory Visit (INDEPENDENT_AMBULATORY_CARE_PROVIDER_SITE_OTHER): Payer: Medicare Other | Admitting: Podiatry

## 2014-12-05 VITALS — BP 122/70 | HR 53 | Resp 16 | Ht 71.0 in | Wt 235.0 lb

## 2014-12-05 DIAGNOSIS — B351 Tinea unguium: Secondary | ICD-10-CM | POA: Diagnosis not present

## 2014-12-05 DIAGNOSIS — M79676 Pain in unspecified toe(s): Secondary | ICD-10-CM

## 2014-12-05 DIAGNOSIS — I739 Peripheral vascular disease, unspecified: Secondary | ICD-10-CM

## 2014-12-05 DIAGNOSIS — M2040 Other hammer toe(s) (acquired), unspecified foot: Secondary | ICD-10-CM | POA: Diagnosis not present

## 2014-12-05 NOTE — Patient Instructions (Signed)
Diabetes and Foot Care Diabetes may cause you to have problems because of poor blood supply (circulation) to your feet and legs. This may cause the skin on your feet to become thinner, break easier, and heal more slowly. Your skin may become dry, and the skin may peel and crack. You may also have nerve damage in your legs and feet causing decreased feeling in them. You may not notice minor injuries to your feet that could lead to infections or more serious problems. Taking care of your feet is one of the most important things you can do for yourself.  HOME CARE INSTRUCTIONS  Wear shoes at all times, even in the house. Do not go barefoot. Bare feet are easily injured.  Check your feet daily for blisters, cuts, and redness. If you cannot see the bottom of your feet, use a mirror or ask someone for help.  Wash your feet with warm water (do not use hot water) and mild soap. Then pat your feet and the areas between your toes until they are completely dry. Do not soak your feet as this can dry your skin.  Apply a moisturizing lotion or petroleum jelly (that does not contain alcohol and is unscented) to the skin on your feet and to dry, brittle toenails. Do not apply lotion between your toes.  Trim your toenails straight across. Do not dig under them or around the cuticle. File the edges of your nails with an emery board or nail file.  Do not cut corns or calluses or try to remove them with medicine.  Wear clean socks or stockings every day. Make sure they are not too tight. Do not wear knee-high stockings since they may decrease blood flow to your legs.  Wear shoes that fit properly and have enough cushioning. To break in new shoes, wear them for just a few hours a day. This prevents you from injuring your feet. Always look in your shoes before you put them on to be sure there are no objects inside.  Do not cross your legs. This may decrease the blood flow to your feet.  If you find a minor scrape,  cut, or break in the skin on your feet, keep it and the skin around it clean and dry. These areas may be cleansed with mild soap and water. Do not cleanse the area with peroxide, alcohol, or iodine.  When you remove an adhesive bandage, be sure not to damage the skin around it.  If you have a wound, look at it several times a day to make sure it is healing.  Do not use heating pads or hot water bottles. They may burn your skin. If you have lost feeling in your feet or legs, you may not know it is happening until it is too late.  Make sure your health care provider performs a complete foot exam at least annually or more often if you have foot problems. Report any cuts, sores, or bruises to your health care provider immediately. SEEK MEDICAL CARE IF:   You have an injury that is not healing.  You have cuts or breaks in the skin.  You have an ingrown nail.  You notice redness on your legs or feet.  You feel burning or tingling in your legs or feet.  You have pain or cramps in your legs and feet.  Your legs or feet are numb.  Your feet always feel cold. SEEK IMMEDIATE MEDICAL CARE IF:   There is increasing redness,   swelling, or pain in or around a wound.  There is a red line that goes up your leg.  Pus is coming from a wound.  You develop a fever or as directed by your health care provider.  You notice a bad smell coming from an ulcer or wound. Document Released: 05/21/2000 Document Revised: 01/24/2013 Document Reviewed: 10/31/2012 Exit[employer] Patient Information 2015 [employer]. This information is not intended to replace advice given to you by your health care provider. Make sure you discuss any questions you have with your health care provider.  

## 2014-12-05 NOTE — Progress Notes (Signed)
   Subjective:    Patient ID: Omar [name], male    DOB: September 13, 1940, 74 y.o.   MRN: [id number]  HPI 74 year old male presents the office they with his wife for diabetic risk assessment. The patient's wife is concerned that the patient trims his own toenails and he will frankly caught himself made area bleed. He currently denies any pain to his feet. He does currently see vascular surgery for which she underwent a abdominal aneurysm repair. He doesn't that he has some blockages to his leg as well for which he follows up with vascular surgery for. He denies any claudication symptoms at this time. He does occasionally get some tingling and numbness to his feet however has not changed recently and it is not causing any pain. No other complaints at this time.    Review of Systems  Constitutional:       Weight changes   HENT:       Sinus problems  Hearing problems  Sneezing   Gastrointestinal: Positive for abdominal pain.       Bloating   Endocrine:       Excessive thirst  Increase urination   Genitourinary:       Blood in urine prostate problems   Musculoskeletal:       Joint pain  Rash  Back pain  Difficulty walking Muscle pain   Skin:       Change in nails   Hematological: Bruises/bleeds easily.  Psychiatric/Behavioral:       Nervous   All other systems reviewed and are negative.      Objective:   Physical Exam AAO 3, NAD DP/PT pulses palpable 1/4 bilaterally, CRT less than 3 seconds Protective sensation intact with Simms Weinstein monofilament, vibratory sensation intact, Achilles tendon reflex intact. Nails are hypertrophic, dystrophic, discolored, brittle, and slightly elongated. There is no swelling erythema or drainage from the nail sites. There is slight tenderness palpation overlying the nails. Hyperkeratotic lesion the distal aspect of the right second toe. Upon debridement no underlying ulceration, drainage or other clinical signs of infection. No open lesions or  pre-ulcerative lesions identified bilaterally elsewhere. There is hammertoe contractures identified bilaterally particularly the right second digit. No other areas of tenderness to bilateral lower extremities. There is no amount edema, erythema, increase in warmth. No pain with calf compression, swelling, warmth, erythema.     Assessment & Plan:  74 year old male presents for diabetic risk assessment, hammertoe deformity -Treatment options discussed including all alternatives, risks, and complications -Nail sharply debrided 10 without complication/bleeding. -Hyperkeratotic lesion sharply debrided 1 without, location/bleeding. -Discussed possible surgical invention of the right second toe hammertoe. I discussed with him that if he wishes to proceed with this he wouldn't have clearance by vascular surgery. He states he'll likely: Off on surgery. Continue offloading pads for now. -Discussed shoe gear modifications. I do believe that he would be a candidate for diabetic shoes. Paperwork was completed for this for precertification. -Continue to monitor the tingling/numbness. Discussed likely early neuropathy and potential treatments.  -Discussed the importance of daily foot inspection. Call the office with any changes. -Follow-up 3 months or sooner if any problems arise. In the meantime, encouraged to call the office with any questions, concerns, change in symptoms. Follow-up with PCP for other issues mentioned in the ROS.   Celesta Gentile, DPM

## 2015-03-11 ENCOUNTER — Ambulatory Visit (INDEPENDENT_AMBULATORY_CARE_PROVIDER_SITE_OTHER): Payer: Medicare Other | Admitting: Sports Medicine

## 2015-03-11 ENCOUNTER — Ambulatory Visit: Payer: Medicare Other

## 2015-03-11 ENCOUNTER — Encounter: Payer: Self-pay | Admitting: Sports Medicine

## 2015-03-11 DIAGNOSIS — B351 Tinea unguium: Secondary | ICD-10-CM | POA: Diagnosis not present

## 2015-03-11 DIAGNOSIS — M2040 Other hammer toe(s) (acquired), unspecified foot: Secondary | ICD-10-CM

## 2015-03-11 DIAGNOSIS — M79673 Pain in unspecified foot: Secondary | ICD-10-CM | POA: Diagnosis not present

## 2015-03-11 DIAGNOSIS — I739 Peripheral vascular disease, unspecified: Secondary | ICD-10-CM

## 2015-03-11 DIAGNOSIS — E119 Type 2 diabetes mellitus without complications: Secondary | ICD-10-CM

## 2015-03-11 NOTE — Progress Notes (Addendum)
Patient ID: Omar [name], male   DOB: 1940/10/10, 74 y.o.   MRN: [id number] Subjective: Omar [name] is a 74 y.o. male patient with history of type 2 diabetes who presents to office today complaining of long, painful nails  while ambulating in shoes. Patient states that the glucose reading this morning was not recorded. Patient states that he attempted to trim his nails himself at got some bleeding at right 2nd toe. Patient denies constitutional symptoms or signs of infection to site. Patient denies any new changes in medication or new problems. Patient denies any new cramping, numbness, burning or tingling in the legs. No other pedal concerns noted.   Past Medical History:  Patient Active Problem List   Diagnosis Date Noted  . Malignant neoplasm of prostate ([place]) 02/20/2014    Medications:  Current Outpatient Prescriptions on File Prior to Visit  Medication Sig Dispense Refill  . ALPRAZolam (XANAX) 0.25 MG tablet Take 0.25 mg by mouth daily.     Marland Kitchen amLODipine-benazepril (LOTREL) 5-20 MG per capsule Take 1 capsule by mouth daily.    Marland Kitchen aspirin 81 MG tablet Take 81 mg by mouth daily.    Marland Kitchen atorvastatin (LIPITOR) 80 MG tablet Take 80 mg by mouth daily.    . busPIRone (BUSPAR) 5 MG tablet Take 5 mg by mouth 2 (two) times daily.     . Butalbital-Acetaminophen (BUPAP) 50-300 MG TABS Take 1 capsule by mouth 4 (four) times daily as needed.     . celecoxib (CELEBREX) 200 MG capsule Take 200 mg by mouth daily.     . cyclobenzaprine (FLEXERIL) 5 MG tablet Take 5 mg by mouth 3 (three) times daily as needed for muscle spasms.    . fexofenadine (ALLEGRA) 180 MG tablet Take 180 mg by mouth daily.    . fluticasone (FLONASE) 50 MCG/ACT nasal spray Place 2 sprays into both nostrils daily.     . Fluticasone Furoate-Vilanterol (BREO ELLIPTA) 100-25 MCG/INH AEPB Inhale 1 puff into the lungs daily.     . folic acid (FOLVITE) 1 MG tablet Take 1 mg by mouth daily.    . isosorbide mononitrate (IMDUR) 30 MG 24 hr  tablet Take 30 mg by mouth daily.    . metFORMIN (GLUCOPHAGE) 500 MG tablet Take 500 mg by mouth 2 (two) times daily with a meal.    . methotrexate (RHEUMATREX) 2.5 MG tablet TAKE FIVE TABLETS BY MOUTH EVERY SEVEN DAYS *CALL OFFICE TO SCHEDULE LAB WORK*    . metoprolol succinate (TOPROL-XL) 25 MG 24 hr tablet Take 25 mg by mouth daily.    . pantoprazole (PROTONIX) 20 MG tablet Take 40 mg by mouth daily.     . tamsulosin (FLOMAX) 0.4 MG CAPS capsule Take 1 capsule (0.4 mg total) by mouth daily. 30 capsule 3   No current facility-administered medications on file prior to visit.    Allergies: No Known Allergies  Labs: HEMOGLOBIN A1C- No recent lab on file  Objective: General: Patient is awake, alert, and oriented x 3 and in no acute distress.  Integument: Skin is warm, dry and supple bilateral. Nails are long, thickened and  dystrophic with subungual debris, consistent with onychomycosis, 1-5 bilateral. No open lesions or preulcerative lesions or signs of infection present bilateral. Remaining integument unremarkable.  Vasculature:  Dorsalis Pedis pulse 1/4 bilateral. Posterior Tibial pulse  1/4 bilateral.  Capillary fill time <3 sec 1-5 bilateral. No hair growth to the level of the digits. Temperature gradient within normal limits. Mild varicosities present bilateral. No  edema present bilateral. No tenderness to calves.   Neurology: The patient has intact sensation measured with a 5.07/10g Semmes Weinstein Monofilament at all pedal sites bilateral . Vibratory sensation diminished bilateral with tuning fork. No Babinski sign present bilateral.   Musculoskeletal: No gross pedal deformities noted bilateral. Muscular strength 5/5 in all lower extremity muscular groups bilateral.  Assessment and Plan: Problem List Items Addressed This Visit    None    Visit Diagnoses    Dermatophytosis of nail    -  Primary    Hammertoe, unspecified laterality        Foot pain, unspecified laterality         Diabetes mellitus without complication (HCC)        PVD (peripheral vascular disease) ([place])           -Examined patient. -Discussed and educated patient on diabetic foot care, especially with  regards to the vascular, neurological and muschloskeletal systems.  -Stressed the importance of good glycemic control and the detriment of not  controlling glucose levels in relation to the foot. -Mechanically debrided all nails 1-5 bilateral using sterile nail nipper without incident  -Patient to continue with hammer toe padding -Answered all patient questions -Patient awaiting diabetic shoes; will have office staff to follow up on this; pre-certificaiton was done at last visit -Patient to return as needed or in 3 months for at risk foot care -Patient advised to call the office if any problems or questions arise in the  Meantime.  Landis Martins, DPM

## 2015-03-31 DIAGNOSIS — I2510 Atherosclerotic heart disease of native coronary artery without angina pectoris: Secondary | ICD-10-CM | POA: Insufficient documentation

## 2015-05-28 ENCOUNTER — Emergency Department: Payer: Medicare Other

## 2015-05-28 ENCOUNTER — Emergency Department
Admission: EM | Admit: 2015-05-28 | Discharge: 2015-05-28 | Disposition: A | Discharge status: Home / Self Care | Payer: Medicare Other | Attending: Emergency Medicine | Admitting: Emergency Medicine

## 2015-05-28 ENCOUNTER — Encounter: Payer: Self-pay | Admitting: *Deleted

## 2015-05-28 DIAGNOSIS — K625 Hemorrhage of anus and rectum: Secondary | ICD-10-CM | POA: Diagnosis present

## 2015-05-28 DIAGNOSIS — Z7982 Long term (current) use of aspirin: Secondary | ICD-10-CM | POA: Diagnosis not present

## 2015-05-28 DIAGNOSIS — F17210 Nicotine dependence, cigarettes, uncomplicated: Secondary | ICD-10-CM | POA: Insufficient documentation

## 2015-05-28 DIAGNOSIS — Z7984 Long term (current) use of oral hypoglycemic drugs: Secondary | ICD-10-CM | POA: Diagnosis not present

## 2015-05-28 DIAGNOSIS — K2971 Gastritis, unspecified, with bleeding: Secondary | ICD-10-CM | POA: Insufficient documentation

## 2015-05-28 DIAGNOSIS — Z7951 Long term (current) use of inhaled steroids: Secondary | ICD-10-CM | POA: Diagnosis not present

## 2015-05-28 DIAGNOSIS — Z79899 Other long term (current) drug therapy: Secondary | ICD-10-CM | POA: Insufficient documentation

## 2015-05-28 DIAGNOSIS — K922 Gastrointestinal hemorrhage, unspecified: Secondary | ICD-10-CM

## 2015-05-28 DIAGNOSIS — I10 Essential (primary) hypertension: Secondary | ICD-10-CM | POA: Diagnosis not present

## 2015-05-28 HISTORY — DX: Type 2 diabetes mellitus without complications: E11.9

## 2015-05-28 LAB — CBC
HEMATOCRIT: 45.8 % (ref 40.0–52.0)
HEMOGLOBIN: 15 g/dL (ref 13.0–18.0)
MCH: 31.1 pg (ref 26.0–34.0)
MCHC: 32.9 g/dL (ref 32.0–36.0)
MCV: 94.7 fL (ref 80.0–100.0)
Platelets: 213 10*3/uL (ref 150–440)
RBC: 4.83 MIL/uL (ref 4.40–5.90)
RDW: 16.9 % — ABNORMAL HIGH (ref 11.5–14.5)
WBC: 8.8 10*3/uL (ref 3.8–10.6)

## 2015-05-28 LAB — COMPREHENSIVE METABOLIC PANEL
ALBUMIN: 4.3 g/dL (ref 3.5–5.0)
ALT: 26 U/L (ref 17–63)
ANION GAP: 6 (ref 5–15)
AST: 21 U/L (ref 15–41)
Alkaline Phosphatase: 73 U/L (ref 38–126)
BILIRUBIN TOTAL: 0.7 mg/dL (ref 0.3–1.2)
BUN: 19 mg/dL (ref 6–20)
CO2: 24 mmol/L (ref 22–32)
Calcium: 9.1 mg/dL (ref 8.9–10.3)
Chloride: 110 mmol/L (ref 101–111)
Creatinine, Ser: 1.31 mg/dL — ABNORMAL HIGH (ref 0.61–1.24)
GFR calc Af Amer: >60 mL/min (ref >60)
GFR calc non Af Amer: 52 mL/min — ABNORMAL LOW (ref >60)
GLUCOSE: 107 mg/dL — AB (ref 65–99)
Potassium: 4.1 mmol/L (ref 3.5–5.1)
SODIUM: 140 mmol/L (ref 135–145)
TOTAL PROTEIN: 8.1 g/dL (ref 6.5–8.1)

## 2015-05-28 LAB — TYPE AND SCREEN
ABO/RH(D): O POS
ANTIBODY SCREEN: NEGATIVE

## 2015-05-28 MED ORDER — IOHEXOL 240 MG/ML SOLN
25.0000 mL | Freq: Once | INTRAMUSCULAR | Status: AC | PRN
  Administered 2015-05-28: 25 mL via ORAL

## 2015-05-28 MED ORDER — CEFTRIAXONE SODIUM 1 G IJ SOLR
INTRAMUSCULAR | Status: AC
  Filled 2015-05-28: qty 10

## 2015-05-28 MED ORDER — IOHEXOL 300 MG/ML  SOLN
100.0000 mL | Freq: Once | INTRAMUSCULAR | Status: AC | PRN
  Administered 2015-05-28: 100 mL via INTRAVENOUS

## 2015-05-28 NOTE — Discharge Instructions (Signed)
Please seek medical attention for any high fevers, chest pain, shortness of breath, change in behavior, persistent vomiting, bloody stool or any other new or concerning symptoms. ° ° °Gastrointestinal Bleeding °Gastrointestinal (GI) bleeding means there is bleeding somewhere along the digestive tract, between the mouth and anus. °CAUSES  °There are many different problems that can cause GI bleeding. Possible causes include: °· Esophagitis. This is inflammation, irritation, or swelling of the esophagus. °· Hemorrhoids. These are veins that are full of blood (engorged) in the rectum. They cause pain, inflammation, and may bleed. °· Anal fissures. These are areas of painful tearing which may bleed. They are often caused by passing hard stool. °· Diverticulosis. These are pouches that form on the colon over time, with age, and may bleed significantly. °· Diverticulitis. This is inflammation in areas with diverticulosis. It can cause pain, fever, and bloody stools, although bleeding is rare. °· Polyps and cancer. Colon cancer often starts out as precancerous polyps. °· Gastritis and ulcers. Bleeding from the upper gastrointestinal tract (near the stomach) may travel through the intestines and produce black, sometimes tarry, often bad smelling stools. In certain cases, if the bleeding is fast enough, the stools may not be black, but red. This condition may be life-threatening. °SYMPTOMS  °· Vomiting bright red blood or material that looks like coffee grounds. °· Bloody, black, or tarry stools. °DIAGNOSIS  °Your caregiver may diagnose your condition by taking your history and performing a physical exam. More tests may be needed, including: °· X-rays and other imaging tests. °· Esophagogastroduodenoscopy (EGD). This test uses a flexible, lighted tube to look at your esophagus, stomach, and small intestine. °· Colonoscopy. This test uses a flexible, lighted tube to look at your colon. °TREATMENT  °Treatment depends on the  cause of your bleeding.  °· For bleeding from the esophagus, stomach, small intestine, or colon, the caregiver doing your EGD or colonoscopy may be able to stop the bleeding as part of the procedure. °· Inflammation or infection of the colon can be treated with medicines. °· Many rectal problems can be treated with creams, suppositories, or warm baths. °· Surgery is sometimes needed. °· Blood transfusions are sometimes needed if you have lost a lot of blood. °If bleeding is slow, you may be allowed to go home. If there is a lot of bleeding, you will need to stay in the hospital for observation. °HOME CARE INSTRUCTIONS  °· Take any medicines exactly as prescribed. °· Keep your stools soft by eating foods that are high in fiber. These foods include whole grains, legumes, fruits, and vegetables. Prunes (1 to 3 a day) work well for many people. °· Drink enough fluids to keep your urine clear or pale yellow. °SEEK IMMEDIATE MEDICAL CARE IF:  °· Your bleeding increases. °· You feel lightheaded, weak, or you faint. °· You have severe cramps in your back or abdomen. °· You pass large blood clots in your stool. °· Your problems are getting worse. °MAKE SURE YOU:  °· Understand these instructions. °· Will watch your condition. °· Will get help right away if you are not doing well or get worse. °  °This information is not intended to replace advice given to you by your health care provider. Make sure you discuss any questions you have with your health care provider. °  °Document Released: 05/21/2000 Document Revised: 05/10/2012 Document Reviewed: 11/11/2014 °Elsevier Interactive Patient Education ©2016 [employer] ° °

## 2015-05-28 NOTE — ED Notes (Signed)
Pt reports rectal bleeding starting yesterday with one large amount of blood soaking with clothes and bed linens, pt denies any other symptoms

## 2015-05-28 NOTE — ED Provider Notes (Signed)
[facility] Emergency Department Provider Note    ____________________________________________  Time seen: 1600  I have reviewed the triage vital signs and the nursing notes.   HISTORY  Chief Complaint Rectal Bleeding   History limited by: Not Limited   HPI Omar [name] is a 74 y.o. male who presents to the emergency department today because of concerns for rectal bleeding. The patient states that yesterday he had an episode of rectal bleeding. He states he was at a gas station when he felt wetness in his underwear. When he got home he did see some blood there. He states he thinks he might of had some bleeding for the past couple of days although he is not sure. He had a bowel movement today and did not notice any blood. He denies any abdominal rectal pain associated with this. He states he is on Plavix. He denies any history of colonoscopy. Does have a history of a triple a repair. Patient denies any chest pain shortness breath or fevers.  Past Medical History  Diagnosis Date  . Anxiety   . GERD (gastroesophageal reflux disease)   . Hypertension   . Heart murmur   . Hypercholesterolemia   . Sleep apnea   . Prostate cancer ([place]) 01/01/13, 01/30/14    Gleason 3+4=7, volume 46.6 cc  . Arthritis     rheumatoid  . Atrial fibrillation (HCC)     hx of  . Rheumatoid arthritis ([place])   . S/P radiation therapy  04/03/2014 through 06/04/2014                                                       Prostate 7800 cGy in 40 sessions                            Patient Active Problem List   Diagnosis Date Noted  . Malignant neoplasm of prostate ([place]) 02/20/2014    Past Surgical History  Procedure Laterality Date  . Tonsillectomy    . Prostate biopsy  01/01/13, 01/30/14    Gleason 3+3=6, vol 46.6 cc  . Abdominal aortic aneurysm repair  11/2013    Current Outpatient Rx  Name  Route  Sig  Dispense  Refill  . ALPRAZolam (XANAX) 0.25 MG tablet   Oral   Take 0.25  mg by mouth daily.          Marland Kitchen amLODipine-benazepril (LOTREL) 5-20 MG per capsule   Oral   Take 1 capsule by mouth daily.         Marland Kitchen aspirin 81 MG tablet   Oral   Take 81 mg by mouth daily.         Marland Kitchen atorvastatin (LIPITOR) 80 MG tablet   Oral   Take 80 mg by mouth daily.         . busPIRone (BUSPAR) 5 MG tablet   Oral   Take 5 mg by mouth 2 (two) times daily.          . Butalbital-Acetaminophen (BUPAP) 50-300 MG TABS   Oral   Take 1 capsule by mouth 4 (four) times daily as needed.          . celecoxib (CELEBREX) 200 MG capsule   Oral   Take 200 mg by mouth daily.          Marland Kitchen  cyclobenzaprine (FLEXERIL) 5 MG tablet   Oral   Take 5 mg by mouth 3 (three) times daily as needed for muscle spasms.         . fexofenadine (ALLEGRA) 180 MG tablet   Oral   Take 180 mg by mouth daily.         . fluticasone (FLONASE) 50 MCG/ACT nasal spray   Each Nare   Place 2 sprays into both nostrils daily.          . Fluticasone Furoate-Vilanterol (BREO ELLIPTA) 100-25 MCG/INH AEPB   Inhalation   Inhale 1 puff into the lungs daily.          . folic acid (FOLVITE) 1 MG tablet   Oral   Take 1 mg by mouth daily.         . isosorbide mononitrate (IMDUR) 30 MG 24 hr tablet   Oral   Take 30 mg by mouth daily.         . metFORMIN (GLUCOPHAGE) 500 MG tablet   Oral   Take 500 mg by mouth 2 (two) times daily with a meal.         . methotrexate (RHEUMATREX) 2.5 MG tablet      TAKE FIVE TABLETS BY MOUTH EVERY SEVEN DAYS *CALL OFFICE TO SCHEDULE LAB WORK*         . metoprolol succinate (TOPROL-XL) 25 MG 24 hr tablet   Oral   Take 25 mg by mouth daily.         . pantoprazole (PROTONIX) 20 MG tablet   Oral   Take 40 mg by mouth daily.          . tamsulosin (FLOMAX) 0.4 MG CAPS capsule   Oral   Take 1 capsule (0.4 mg total) by mouth daily.   30 capsule   3     Allergies Review of patient's allergies indicates no known allergies.  Family History   Problem Relation Age of Onset  . Heart attack Mother   . Cirrhosis Father   . Cancer Brother     pancreatic  . Diabetes Brother   . Diabetes Daughter     medication induced for cancer treatments  . Cancer Daughter     breast, brain    Social History Social History  Substance Use Topics  . Smoking status: Current Every Day Smoker -- 2.00 packs/day for 50 years    Types: Cigarettes  . Smokeless tobacco: Never Used  . Alcohol Use: 0.6 oz/week    1 Cans of beer per week     Comment: occasionally    Review of Systems Constitutional: Negative for fever. Cardiovascular: Negative for chest pain. Respiratory: Negative for shortness of breath. Gastrointestinal: Negative for abdominal pain, vomiting and diarrhea. Neurological: Negative for headaches, focal weakness or numbness.  10-point ROS otherwise negative.  ____________________________________________   PHYSICAL EXAM:  VITAL SIGNS: ED Triage Vitals  Enc Vitals Group     BP 05/28/15 1438 131/63 mmHg     Pulse Rate 05/28/15 1438 65     Resp 05/28/15 1438 16     Temp 05/28/15 1438 98 F (36.7 C)     Temp Source 05/28/15 1438 Oral     SpO2 05/28/15 1438 95 %     Weight 05/28/15 1438 227 lb (102.967 kg)     Height 05/28/15 1438 5\' 11"  (1.803 m)   Constitutional: Alert and oriented. Well appearing and in no distress. Eyes: Conjunctivae are normal. PERRL. Normal extraocular movements. ENT  Head: Normocephalic and atraumatic.   Nose: No congestion/rhinnorhea.   Mouth/Throat: Mucous membranes are moist.   Neck: No stridor. Hematological/Lymphatic/Immunilogical: No cervical lymphadenopathy. Cardiovascular: Normal rate, regular rhythm.  No murmurs, rubs, or gallops. Respiratory: Normal respiratory effort without tachypnea nor retractions. Breath sounds are clear and equal bilaterally. No wheezes/rales/rhonchi. Gastrointestinal: Soft and nontender. No distention. There is no CVA tenderness. Rectal: No red  blood on glove. Brown stool. GUIAC positive.  Musculoskeletal: Normal range of motion in all extremities. No joint effusions.  No lower extremity tenderness nor edema. Neurologic:  Normal speech and language. No gross focal neurologic deficits are appreciated.  Skin:  Skin is warm, dry and intact. No rash noted. Psychiatric: Mood and affect are normal. Speech and behavior are normal. Patient exhibits appropriate insight and judgment.  ____________________________________________    LABS (pertinent positives/negatives)  Labs Reviewed  COMPREHENSIVE METABOLIC PANEL - Abnormal; Notable for the following:    Glucose, Bld 107 (*)    Creatinine, Ser 1.31 (*)    GFR calc non Af Amer 52 (*)    All other components within normal limits  CBC - Abnormal; Notable for the following:    RDW 16.9 (*)    All other components within normal limits  TYPE AND SCREEN  ABO/RH     ____________________________________________   EKG  None  ____________________________________________    RADIOLOGY  CT abd/pel IMPRESSION: Fatty liver.  Hepatic and bilateral renal cysts.  Left colonic diverticulosis. No active diverticulitis.  Bilateral inguinal hernias. Right inguinal hernia containing small bowel loops without obstruction.  ____________________________________________   PROCEDURES  Procedure(s) performed: None  Critical Care performed: No  ____________________________________________   INITIAL IMPRESSION / ASSESSMENT AND PLAN / ED COURSE  Pertinent labs & imaging results that were available during my care of the patient were reviewed by me and considered in my medical decision making (see chart for details).  Patient presented to the emergency department today because of concerns for GI bleed. Patient had a bloody bowel movement yesterday. He states today he has had further bowel movements although has not noticed any blood. On exam he did not have any red blood on the glove  however was guaiac positive. Hemoglobin here within normal limits. Vital signs were stable. I did obtain a CT abdomen and pelvis she did not show any obvious signs of bleeding. At this point I doubt aortoenteric fistula. Discussed return precautions with the patient. Will have patient follow-up with GI.  ____________________________________________   FINAL CLINICAL IMPRESSION(S) / ED DIAGNOSES  Final diagnoses:  Gastrointestinal hemorrhage, unspecified gastritis, unspecified gastrointestinal hemorrhage type     Nance Pear, MD 05/28/15 2045

## 2015-05-28 NOTE — ED Notes (Signed)
Pt discharged to home.  Discharge instructions reviewed.  No questions or concerns at this time.  Teach back verified.  No items left in ED.  Pt in NAD.

## 2015-05-29 LAB — ABO/RH: ABO/RH(D): O POS

## 2015-06-17 ENCOUNTER — Encounter: Payer: Self-pay | Admitting: Sports Medicine

## 2015-06-17 ENCOUNTER — Ambulatory Visit: Payer: Medicare Other | Admitting: Sports Medicine

## 2015-06-17 ENCOUNTER — Ambulatory Visit (INDEPENDENT_AMBULATORY_CARE_PROVIDER_SITE_OTHER): Payer: Medicare Other | Admitting: Sports Medicine

## 2015-06-17 DIAGNOSIS — B351 Tinea unguium: Secondary | ICD-10-CM | POA: Diagnosis not present

## 2015-06-17 DIAGNOSIS — M2040 Other hammer toe(s) (acquired), unspecified foot: Secondary | ICD-10-CM

## 2015-06-17 DIAGNOSIS — M79673 Pain in unspecified foot: Secondary | ICD-10-CM

## 2015-06-17 DIAGNOSIS — E119 Type 2 diabetes mellitus without complications: Secondary | ICD-10-CM

## 2015-06-17 DIAGNOSIS — I739 Peripheral vascular disease, unspecified: Secondary | ICD-10-CM

## 2015-06-17 NOTE — Progress Notes (Signed)
Patient ID: Omar [name], male   DOB: [birth year], 75 y.o.   MRN: [id number]  Subjective: Omar [name] is a 75 y.o. male patient with history of type 2 diabetes who presents to office today complaining of long, painful nails  while ambulating in shoes. Patient states that the glucose reading this morning was 118mg /dl. Patient denies any new changes in medication or new problems. Patient denies any new cramping, numbness, burning or tingling in the legs. No other pedal concerns noted.   Past Medical History:  Patient Active Problem List   Diagnosis Date Noted  . Malignant neoplasm of prostate ([place]) 02/20/2014    Medications:  Current Outpatient Prescriptions on File Prior to Visit  Medication Sig Dispense Refill  . amLODipine-benazepril (LOTREL) 5-20 MG per capsule Take 1 capsule by mouth daily.    Marland Kitchen aspirin 81 MG tablet Take 81 mg by mouth daily.    Marland Kitchen atorvastatin (LIPITOR) 80 MG tablet Take 80 mg by mouth daily.    Marland Kitchen azithromycin (ZITHROMAX) 250 MG tablet Take 250 mg by mouth daily.     . busPIRone (BUSPAR) 5 MG tablet Take 5 mg by mouth 2 (two) times daily.     . Butalbital-APAP-Caffeine 50-300-40 MG CAPS Take 1 capsule by mouth as needed.     . celecoxib (CELEBREX) 200 MG capsule Take 200 mg by mouth daily.     . clopidogrel (PLAVIX) 75 MG tablet Take 75 mg by mouth daily.    . cyclobenzaprine (FLEXERIL) 5 MG tablet Take 5 mg by mouth 3 (three) times daily as needed for muscle spasms.    . fexofenadine (ALLEGRA) 180 MG tablet Take 180 mg by mouth daily.    . fluticasone (FLONASE) 50 MCG/ACT nasal spray Place 2 sprays into both nostrils daily.     . Fluticasone Furoate-Vilanterol (BREO ELLIPTA) 100-25 MCG/INH AEPB Inhale 1 puff into the lungs daily.     . folic acid (FOLVITE) 1 MG tablet Take 1 mg by mouth daily.    Marland Kitchen guaiFENesin-codeine 100-10 MG/5ML syrup Take 5 mLs by mouth every 6 (six) hours as needed.     . isosorbide mononitrate (IMDUR) 30 MG 24 hr tablet Take 30 mg by mouth  daily.    . metFORMIN (GLUCOPHAGE) 500 MG tablet Take 500 mg by mouth 2 (two) times daily with a meal.    . methotrexate (RHEUMATREX) 2.5 MG tablet TAKE SIX TABLETS BY MOUTH EVERY SEVEN DAYS *CALL OFFICE TO SCHEDULE LAB WORK*    . pantoprazole (PROTONIX) 20 MG tablet Take 40 mg by mouth daily.     . tamsulosin (FLOMAX) 0.4 MG CAPS capsule Take 1 capsule (0.4 mg total) by mouth daily. 30 capsule 3   No current facility-administered medications on file prior to visit.    Allergies: No Known Allergies  Labs: HEMOGLOBIN A1C- No recent lab on file  Objective: General: Patient is awake, alert, and oriented x 3 and in no acute distress.  Integument: Skin is warm, dry and supple bilateral. Nails are long, thickened and  dystrophic with subungual debris, consistent with onychomycosis, 1-5 bilateral. No open lesions or preulcerative lesions or signs of infection present bilateral. Remaining integument unremarkable.  Vasculature:  Dorsalis Pedis pulse 1/4 bilateral. Posterior Tibial pulse  1/4 bilateral.  Capillary fill time <3 sec 1-5 bilateral. No hair growth to the level of the digits. Temperature gradient within normal limits. Mild varicosities present bilateral. No edema present bilateral. No tenderness to calves.   Neurology: The patient has intact sensation measured  with a 5.07/10g Semmes Weinstein Monofilament at all pedal sites bilateral . Vibratory sensation diminished bilateral with tuning fork. No Babinski sign present bilateral.   Musculoskeletal: No gross pedal deformities noted bilateral. Muscular strength 5/5 in all lower extremity muscular groups bilateral.  Assessment and Plan: Problem List Items Addressed This Visit    None    Visit Diagnoses    Dermatophytosis of nail    -  Primary    Hammertoe, unspecified laterality        Foot pain, unspecified laterality        PVD (peripheral vascular disease) (HCC)        Diabetes mellitus without complication ([place])           -Examined patient. -Discussed and educated patient on diabetic foot care, especially with  regards to the vascular, neurological and muschloskeletal systems.  -Stressed the importance of good glycemic control and the detriment of not  controlling glucose levels in relation to the foot. -Mechanically debrided all nails 1-5 bilateral using sterile nail nipper without incident  -Patient to continue with hammer toe padding -Answered all patient questions -Patient awaiting diabetic shoes; pre-cert done -Patient to return as needed or in 3 months for at risk foot care -Patient advised to call the office if any problems or questions arise in the  Meantime.  Landis Martins, DPM

## 2015-07-04 ENCOUNTER — Encounter: Payer: Self-pay | Admitting: *Deleted

## 2015-07-07 ENCOUNTER — Encounter: Payer: Self-pay | Admitting: *Deleted

## 2015-07-07 ENCOUNTER — Ambulatory Visit
Admission: RE | Admit: 2015-07-07 | Discharge: 2015-07-07 | Disposition: A | Discharge status: Home / Self Care | Payer: Medicare Other | Source: Ambulatory Visit | Attending: Gastroenterology | Admitting: Gastroenterology

## 2015-07-07 ENCOUNTER — Encounter
Admission: RE | Disposition: A | Discharge status: Home / Self Care | Payer: Self-pay | Source: Ambulatory Visit | Attending: Gastroenterology

## 2015-07-07 ENCOUNTER — Ambulatory Visit: Payer: Medicare Other | Admitting: Anesthesiology

## 2015-07-07 DIAGNOSIS — D125 Benign neoplasm of sigmoid colon: Secondary | ICD-10-CM | POA: Insufficient documentation

## 2015-07-07 DIAGNOSIS — E119 Type 2 diabetes mellitus without complications: Secondary | ICD-10-CM | POA: Diagnosis not present

## 2015-07-07 DIAGNOSIS — F17210 Nicotine dependence, cigarettes, uncomplicated: Secondary | ICD-10-CM | POA: Diagnosis not present

## 2015-07-07 DIAGNOSIS — K621 Rectal polyp: Secondary | ICD-10-CM | POA: Insufficient documentation

## 2015-07-07 DIAGNOSIS — K5730 Diverticulosis of large intestine without perforation or abscess without bleeding: Secondary | ICD-10-CM | POA: Insufficient documentation

## 2015-07-07 DIAGNOSIS — E7800 Pure hypercholesterolemia, unspecified: Secondary | ICD-10-CM | POA: Diagnosis not present

## 2015-07-07 DIAGNOSIS — D124 Benign neoplasm of descending colon: Secondary | ICD-10-CM | POA: Diagnosis not present

## 2015-07-07 DIAGNOSIS — I10 Essential (primary) hypertension: Secondary | ICD-10-CM | POA: Insufficient documentation

## 2015-07-07 DIAGNOSIS — G4730 Sleep apnea, unspecified: Secondary | ICD-10-CM | POA: Diagnosis not present

## 2015-07-07 DIAGNOSIS — Z7982 Long term (current) use of aspirin: Secondary | ICD-10-CM | POA: Insufficient documentation

## 2015-07-07 DIAGNOSIS — D122 Benign neoplasm of ascending colon: Secondary | ICD-10-CM | POA: Diagnosis not present

## 2015-07-07 DIAGNOSIS — Z7984 Long term (current) use of oral hypoglycemic drugs: Secondary | ICD-10-CM | POA: Diagnosis not present

## 2015-07-07 DIAGNOSIS — K627 Radiation proctitis: Secondary | ICD-10-CM | POA: Diagnosis not present

## 2015-07-07 DIAGNOSIS — Z7902 Long term (current) use of antithrombotics/antiplatelets: Secondary | ICD-10-CM | POA: Diagnosis not present

## 2015-07-07 DIAGNOSIS — I4891 Unspecified atrial fibrillation: Secondary | ICD-10-CM | POA: Insufficient documentation

## 2015-07-07 DIAGNOSIS — F419 Anxiety disorder, unspecified: Secondary | ICD-10-CM | POA: Insufficient documentation

## 2015-07-07 DIAGNOSIS — K625 Hemorrhage of anus and rectum: Secondary | ICD-10-CM | POA: Diagnosis present

## 2015-07-07 DIAGNOSIS — Z79891 Long term (current) use of opiate analgesic: Secondary | ICD-10-CM | POA: Diagnosis not present

## 2015-07-07 DIAGNOSIS — Z7951 Long term (current) use of inhaled steroids: Secondary | ICD-10-CM | POA: Insufficient documentation

## 2015-07-07 DIAGNOSIS — K635 Polyp of colon: Secondary | ICD-10-CM

## 2015-07-07 DIAGNOSIS — K219 Gastro-esophageal reflux disease without esophagitis: Secondary | ICD-10-CM | POA: Diagnosis not present

## 2015-07-07 DIAGNOSIS — K921 Melena: Secondary | ICD-10-CM | POA: Diagnosis not present

## 2015-07-07 DIAGNOSIS — Z8546 Personal history of malignant neoplasm of prostate: Secondary | ICD-10-CM | POA: Diagnosis not present

## 2015-07-07 DIAGNOSIS — M069 Rheumatoid arthritis, unspecified: Secondary | ICD-10-CM | POA: Diagnosis not present

## 2015-07-07 DIAGNOSIS — K5520 Angiodysplasia of colon without hemorrhage: Secondary | ICD-10-CM | POA: Diagnosis not present

## 2015-07-07 DIAGNOSIS — Z79899 Other long term (current) drug therapy: Secondary | ICD-10-CM | POA: Insufficient documentation

## 2015-07-07 HISTORY — PX: COLONOSCOPY WITH PROPOFOL: SHX5780

## 2015-07-07 HISTORY — DX: Polyp of colon: K63.5

## 2015-07-07 LAB — GLUCOSE, CAPILLARY: Glucose-Capillary: 83 mg/dL (ref 65–99)

## 2015-07-07 SURGERY — COLONOSCOPY WITH PROPOFOL
Anesthesia: General

## 2015-07-07 MED ORDER — FENTANYL CITRATE (PF) 100 MCG/2ML IJ SOLN
INTRAMUSCULAR | Status: DC | PRN
  Administered 2015-07-07: 50 ug via INTRAVENOUS

## 2015-07-07 MED ORDER — EPHEDRINE SULFATE 50 MG/ML IJ SOLN
INTRAMUSCULAR | Status: DC | PRN
  Administered 2015-07-07 (×4): 5 mg via INTRAVENOUS

## 2015-07-07 MED ORDER — PROPOFOL 500 MG/50ML IV EMUL
INTRAVENOUS | Status: DC | PRN
  Administered 2015-07-07: 125 ug/kg/min via INTRAVENOUS

## 2015-07-07 MED ORDER — SODIUM CHLORIDE 0.9 % IV SOLN
INTRAVENOUS | Status: DC
  Administered 2015-07-07: 10:00:00 via INTRAVENOUS

## 2015-07-07 MED ORDER — PROPOFOL 10 MG/ML IV BOLUS
INTRAVENOUS | Status: DC | PRN
  Administered 2015-07-07: 40 mg via INTRAVENOUS

## 2015-07-07 MED ORDER — PHENYLEPHRINE HCL 10 MG/ML IJ SOLN
INTRAMUSCULAR | Status: DC | PRN
  Administered 2015-07-07: 100 ug via INTRAVENOUS

## 2015-07-07 MED ORDER — LIDOCAINE HCL (CARDIAC) 20 MG/ML IV SOLN
INTRAVENOUS | Status: DC | PRN
  Administered 2015-07-07: 30 mg via INTRAVENOUS

## 2015-07-07 NOTE — H&P (Signed)
Primary Care Physician:  Volanda Napoleon, MD  Pre-Procedure History & Physical: HPI:  Omar [name] is a 75 y.o. male is here for an colonoscopy.   Past Medical History  Diagnosis Date  . Anxiety   . GERD (gastroesophageal reflux disease)   . Hypertension   . Heart murmur   . Hypercholesterolemia   . Sleep apnea   . Prostate cancer ([place]) 01/01/13, 01/30/14    Gleason 3+4=7, volume 46.6 cc  . Arthritis     rheumatoid  . Atrial fibrillation (H[facility])     hx of  . Rheumatoid arthritis ([place])   . S/P radiation therapy  04/03/2014 through 06/04/2014                                                       Prostate 7800 cGy in 40 sessions                          . Diabetes mellitus without complication [facility])     Past Surgical History  Procedure Laterality Date  . Tonsillectomy    . Prostate biopsy  01/01/13, 01/30/14    Gleason 3+3=6, vol 46.6 cc  . Abdominal aortic aneurysm repair  11/2013  . Nasal sinus surgery    . Uvula surgery      for sleep apnea    Prior to Admission medications   Medication Sig Start Date End Date Taking? Authorizing Provider  amLODipine-benazepril (LOTREL) 5-20 MG per capsule Take 1 capsule by mouth daily.   Yes Historical Provider, MD  aspirin 81 MG tablet Take 81 mg by mouth daily.   Yes Historical Provider, MD  celecoxib (CELEBREX) 200 MG capsule Take 200 mg by mouth daily.    Yes Historical Provider, MD  clopidogrel (PLAVIX) 75 MG tablet Take 75 mg by mouth daily.   Yes Historical Provider, MD  folic acid (FOLVITE) 1 MG tablet Take 1 mg by mouth daily.   Yes Historical Provider, MD  isosorbide mononitrate (IMDUR) 30 MG 24 hr tablet Take 30 mg by mouth daily.   Yes Historical Provider, MD  methotrexate (RHEUMATREX) 2.5 MG tablet TAKE SIX TABLETS BY MOUTH EVERY SEVEN DAYS *CALL OFFICE TO SCHEDULE LAB WORK* 10/07/14  Yes Historical Provider, MD  pantoprazole (PROTONIX) 20 MG tablet Take 40 mg by mouth daily.    Yes Historical Provider, MD  polyethylene  glycol-electrolytes (NULYTELY/GOLYTELY) 420 g solution Take 4,000 mLs by mouth once.   Yes Historical Provider, MD  atorvastatin (LIPITOR) 80 MG tablet Take 80 mg by mouth daily.    Historical Provider, MD  azithromycin (ZITHROMAX) 250 MG tablet Take 250 mg by mouth daily.  05/26/15   Historical Provider, MD  busPIRone (BUSPAR) 5 MG tablet Take 5 mg by mouth 2 (two) times daily.     Historical Provider, MD  Butalbital-APAP-Caffeine 50-300-40 MG CAPS Take 1 capsule by mouth as needed.  05/13/15   Historical Provider, MD  cyclobenzaprine (FLEXERIL) 5 MG tablet Take 5 mg by mouth 3 (three) times daily as needed for muscle spasms.    Historical Provider, MD  fexofenadine (ALLEGRA) 180 MG tablet Take 180 mg by mouth daily.    Historical Provider, MD  fluticasone (FLONASE) 50 MCG/ACT nasal spray Place 2 sprays into both nostrils daily.     Historical Provider,  MD  Fluticasone Furoate-Vilanterol (BREO ELLIPTA) 100-25 MCG/INH AEPB Inhale 1 puff into the lungs daily.     Historical Provider, MD  guaiFENesin-codeine 100-10 MG/5ML syrup Take 5 mLs by mouth every 6 (six) hours as needed.  05/26/15   Historical Provider, MD  metFORMIN (GLUCOPHAGE) 500 MG tablet Take 500 mg by mouth 2 (two) times daily with a meal.    Historical Provider, MD  tamsulosin (FLOMAX) 0.4 MG CAPS capsule Take 1 capsule (0.4 mg total) by mouth daily. Patient not taking: Reported on 07/07/2015 06/03/14   Arloa Koh, MD    Allergies as of 06/18/2015  . (No Known Allergies)    Family History  Problem Relation Age of Onset  . Heart attack Mother   . Cirrhosis Father   . Cancer Brother     pancreatic  . Diabetes Brother   . Diabetes Daughter     medication induced for cancer treatments  . Cancer Daughter     breast, brain    Social History   Social History  . Marital Status: Divorced    Spouse Name: N/A  . Number of Children: N/A  . Years of Education: N/A   Occupational History  . Not on file.   Social History  Main Topics  . Smoking status: Current Every Day Smoker -- 2.00 packs/day for 50 years    Types: Cigarettes  . Smokeless tobacco: Never Used  . Alcohol Use: 0.6 oz/week    1 Cans of beer per week     Comment: occasionally  . Drug Use: No  . Sexual Activity: Not on file   Other Topics Concern  . Not on file   Social History Narrative     Physical Exam: BP 134/63 mmHg  Pulse 57  Temp(Src) 95.5 F (35.3 C) (Tympanic)  Resp 18  Ht 5\' 11"  (1.803 m)  Wt 102.967 kg (227 lb)  BMI 31.67 kg/m2  SpO2 100% General:   Alert,  pleasant and cooperative in NAD Head:  Normocephalic and atraumatic. Neck:  Supple; no masses or thyromegaly. Lungs:  Clear throughout to auscultation.    Heart:  Regular rate and rhythm. Abdomen:  Soft, nontender and nondistended. Normal bowel sounds, without guarding, and without rebound.   Neurologic:  Alert and  oriented x4;  grossly normal neurologically.  Impression/Plan: Omar [name] is here for an colonoscopy to be performed for rectal bleeding  Risks, benefits, limitations, and alternatives regarding  colonoscopy have been reviewed with the patient.  Questions have been answered.  All parties agreeable.   Josefine Class, MD  07/07/2015, 10:27 AM

## 2015-07-07 NOTE — Anesthesia Postprocedure Evaluation (Signed)
Anesthesia Post Note  Patient: Omar [name]  Procedure(s) Performed: Procedure(s) (LRB): COLONOSCOPY WITH PROPOFOL (N/A)  Patient location during evaluation: Endoscopy Anesthesia Type: General Level of consciousness: awake Pain management: pain level controlled Vital Signs Assessment: post-procedure vital signs reviewed and stable Respiratory status: spontaneous breathing Cardiovascular status: blood pressure returned to baseline Anesthetic complications: no    Last Vitals:  Filed Vitals:   07/07/15 1130 07/07/15 1140  BP: 107/64 122/65  Pulse: 37 37  Temp:    Resp: 20 15    Last Pain: There were no vitals filed for this visit.               Mohammad Granade S

## 2015-07-07 NOTE — Anesthesia Preprocedure Evaluation (Addendum)
Anesthesia Evaluation  Patient identified by MRN, date of birth, ID band Patient awake    Reviewed: Allergy & Precautions, NPO status , Patient's Chart, lab work & pertinent test results, reviewed documented beta blocker date and time   Airway Mallampati: II  TM Distance: >3 FB     Dental  (+) Chipped   Pulmonary sleep apnea , Current Smoker,           Cardiovascular hypertension, Pt. on medications + Valvular Problems/Murmurs      Neuro/Psych Anxiety    GI/Hepatic GERD  ,  Endo/Other  diabetes, Type 2  Renal/GU      Musculoskeletal  (+) Arthritis ,   Abdominal   Peds  Hematology   Anesthesia Other Findings Uses CPAP only occasionally. I told him he must use it tonite. Smokes. A Fib. EKG from 62015 checked.  Reproductive/Obstetrics                            Anesthesia Physical Anesthesia Plan  ASA: III  Anesthesia Plan: General   Post-op Pain Management:    Induction: Intravenous  Airway Management Planned: Nasal Cannula  Additional Equipment:   Intra-op Plan:   Post-operative Plan:   Informed Consent: I have reviewed the patients History and Physical, chart, labs and discussed the procedure including the risks, benefits and alternatives for the proposed anesthesia with the patient or authorized representative who has indicated his/her understanding and acceptance.     Plan Discussed with: CRNA  Anesthesia Plan Comments:         Anesthesia Quick Evaluation

## 2015-07-07 NOTE — Op Note (Signed)
[facility]astroenterology Patient Name: Omar [name] Procedure Date: 07/07/2015 10:28 AM MRN: [id number] Account #: [id number] Date of Birth: [birth year] Admit Type: Outpatient Age: 75 Room: [facility] ENDO ROOM 2 Gender: Male Note Status: Finalized Procedure:         Colonoscopy Indications:       This is the patient's first colonoscopy, Hematochezia Patient Profile:   This is a 75 year old male. Providers:         Gerrit Heck. Rayann Heman, MD Referring MD:      [facility] Medicines:         Propofol per Anesthesia Complications:     No immediate complications. Procedure:         Pre-Anesthesia Assessment:                    - Prior to the procedure, a History and Physical was                     performed, and patient medications, allergies and                     sensitivities were reviewed. The patient's tolerance of                     previous anesthesia was reviewed.                    After obtaining informed consent, the colonoscope was                     passed under direct vision. Throughout the procedure, the                     patient's blood pressure, pulse, and oxygen saturations                     were monitored continuously. The Colonoscope was                     introduced through the anus and advanced to the the cecum,                     identified by appendiceal orifice and ileocecal valve. The                     colonoscopy was performed without difficulty. The patient                     tolerated the procedure well. The quality of the bowel                     preparation was good. Findings:      A few small and large-mouthed diverticula were found in the sigmoid       colon.      The perianal and digital rectal examinations were normal.      A sessile non-obstructing mass was found in the proximal ascending colon       just behind IC valve. The mass was non-circumferential. In addition, its       diameter measured twenty mm. No bleeding  was present. Biopsies were       taken with a cold forceps for histology.      A 15 mm polyp was found in the distal ascending colon about 5 folds  behind IC valve. The polyp was sessile. Biopsies were taken with a cold       forceps for histology. Area was tattooed with an injection of 5 mL of       Spot (carbon black).      A 3 mm polyp was found in the mid ascending colon. The polyp was       sessile. The polyp was removed with a jumbo cold forceps. Resection and       retrieval were complete.      A 3 mm polyp was found in the sigmoid colon. The polyp was sessile. The       polyp was removed with a jumbo cold forceps. Resection and retrieval       were complete.      - Multiple hyperplastic appearing polyps in rectum, not removed. To be       addressed at next colonoscopy.      Multiple localized angioectasias without bleeding were found in the       distal rectum. Fulguration to ablate the lesion by argon plasma at [facility] rectum setting was successful.      The exam was otherwise without abnormality on direct and retroflexion       views. Impression:        - Diverticulosis in the sigmoid colon.                    - Rule out malignancy, tumor in the proximal ascending                     colon. NOT REMOVED. Biopsied.                    - One 15 mm polyp in the distal ascending colon. Biopsied.                     NOT REMOVED. Tattooed just distal to this polyp.                    - One 3 mm polyp in the mid ascending colon. Resected and                     retrieved.                    - One 3 mm polyp in the sigmoid colon. Resected and                     retrieved.                    - Multiple non-bleeding colonic angioectasias. Treated                     with argon plasma coagulation (APC).                    - The examination was otherwise normal on direct and                     retroflexion views. Recommendation:    - Observe patient in GI recovery unit.                     - Resume regular diet.                    -  Continue present medications.                    - Await pathology results.                    - Repeat colonoscopy in 2 years for surveillance.                    - Refer to a surgeon at appointment to be scheduled for                     right hemicolectomy for removal of prox asc colon mass and                     distal asc colon polyp.                    - Return to referring physician.                    - The findings and recommendations were discussed with the                     patient.                    - The findings and recommendations were discussed with the                     patient's family. Procedure Code(s): --- Professional ---                    [phone], Colonoscopy, flexible; with ablation of tumor(s),                     polyp(s), or other lesion(s) (includes pre- and                     post-dilation and guide wire passage, when performed)                    45380, 49, Colonoscopy, flexible; with biopsy, single or                     multiple                    45381, Colonoscopy, flexible; with directed submucosal                     injection(s), any substance Diagnosis Code(s): --- Professional ---                    D49.0, Neoplasm of unspecified behavior of digestive system                    D12.2, Benign neoplasm of ascending colon                    D12.5, Benign neoplasm of sigmoid colon                    K55.20, Angiodysplasia of colon without hemorrhage                    K92.1, [place] CPT copyright 2014 American Medical Association. All rights reserved. The codes documented in this report are preliminary and upon coder review may  be revised to meet current compliance requirements. Rodman Key  Aura Dials, MD 07/07/2015 11:17:15 AM This report has been signed electronically. Number of Addenda: 0 Note Initiated On: 07/07/2015 10:28 AM Scope Withdrawal Time: 0 hours 21 minutes 4 seconds  Total  Procedure Duration: 0 hours 28 minutes 7 seconds       [facility]

## 2015-07-07 NOTE — Transfer of Care (Signed)
Immediate Anesthesia Transfer of Care Note  Patient: Omar [name]  Procedure(s) Performed: Procedure(s): COLONOSCOPY WITH PROPOFOL (N/A)  Patient Location: PACU and Endoscopy Unit  Anesthesia Type:General  Level of Consciousness: awake  Airway & Oxygen Therapy: Patient Spontanous Breathing  Post-op Assessment: Report given to RN  Post vital signs: stable  Last Vitals:  Filed Vitals:   07/07/15 0944  BP: 134/63  Pulse: 57  Temp: 35.3 C  Resp: 18    Complications: No apparent anesthesia complications

## 2015-07-08 ENCOUNTER — Encounter: Payer: Self-pay | Admitting: Gastroenterology

## 2015-07-09 LAB — SURGICAL PATHOLOGY

## 2015-07-10 ENCOUNTER — Ambulatory Visit (INDEPENDENT_AMBULATORY_CARE_PROVIDER_SITE_OTHER): Payer: Medicare Other | Admitting: General Surgery

## 2015-07-10 ENCOUNTER — Encounter: Payer: Self-pay | Admitting: General Surgery

## 2015-07-10 VITALS — BP 130/80 | HR 68 | Resp 14 | Ht 71.0 in | Wt 230.0 lb

## 2015-07-10 DIAGNOSIS — K6389 Other specified diseases of intestine: Secondary | ICD-10-CM | POA: Diagnosis not present

## 2015-07-10 DIAGNOSIS — R1011 Right upper quadrant pain: Secondary | ICD-10-CM

## 2015-07-10 MED ORDER — POLYETHYLENE GLYCOL 3350 17 GM/SCOOP PO POWD: ORAL | Status: DC

## 2015-07-10 MED ORDER — NEOMYCIN SULFATE 500 MG PO TABS: ORAL_TABLET | ORAL | Status: DC

## 2015-07-10 MED ORDER — METRONIDAZOLE 500 MG PO TABS: ORAL_TABLET | ORAL | Status: AC

## 2015-07-10 NOTE — Progress Notes (Addendum)
Patient ID: Omar [name], male   DOB: [birth year], 75 y.o.   MRN: [id number]  Chief Complaint  Patient presents with  . Other    mass    HPI Omar [name] is a 75 y.o. male here today for a evaluation of a colon mass. Patient had an colonoscopy performed on 07/07/15. No bleeding or pain at this time. Daily bowl movements. Patient states in mid December he had a couple episodes of rectal bleeding. He was evaluated to the emergency room, referred to the GI department and then on colonoscopy.    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  Since his retirement 2 years ago he has been fairly sedentary.  The patient reports that he will have occasional right upper quadrant pain after eating greasy foods. This is not limited his diet.   The patient is accompanied by a good friend, Omar [name].  I personally reviewed the patient's history. HPI  Past Medical History  Diagnosis Date  . Anxiety   . GERD (gastroesophageal reflux disease)   . Hypertension   . Heart murmur   . Hypercholesterolemia   . Sleep apnea   . Prostate cancer ([place]) 01/01/13, 01/30/14    Gleason 3+4=7, volume 46.6 cc  . Arthritis     rheumatoid  . Atrial fibrillation (HCC)     hx of  . Rheumatoid arthritis ([place])   . S/P radiation therapy  04/03/2014 through 06/04/2014                                                       Prostate 7800 cGy in 40 sessions                          . Diabetes mellitus without complication [facility])     Past Surgical History  Procedure Laterality Date  . Tonsillectomy    . Prostate biopsy  01/01/13, 01/30/14    Gleason 3+3=6, vol 46.6 cc  . Abdominal aortic aneurysm repair  11/2013  . Nasal sinus surgery    . Uvula surgery      for sleep apnea  . Colonoscopy with propofol N/A 07/07/2015    Procedure: COLONOSCOPY WITH PROPOFOL;  Surgeon: Josefine Class, MD;  Location: [facility] ENDOSCOPY;  Service: Endoscopy;  Laterality: N/A;     Family History  Problem Relation Age of Onset  . Heart attack Mother   . Cirrhosis Father   . Cancer Brother     pancreatic  . Diabetes Brother   . Diabetes Daughter     medication induced for cancer treatments  . Cancer Daughter     breast, brain    Social History Social History  Substance Use Topics  . Smoking status: Current Every Day Smoker -- 2.00 packs/day for 50 years    Types: Cigarettes  . Smokeless tobacco: Never Used  . Alcohol Use: 0.6 oz/week    1 Cans of beer per week     Comment: occasionally    No Known Allergies  Current Outpatient Prescriptions  Medication Sig Dispense Refill  . amLODipine-benazepril (LOTREL) 5-20 MG per capsule Take 1 capsule by mouth daily.    Marland Kitchen aspirin 81 MG tablet Take 81 mg by mouth daily.    Marland Kitchen  atorvastatin (LIPITOR) 80 MG tablet Take 80 mg by mouth daily.    Marland Kitchen azithromycin (ZITHROMAX) 250 MG tablet Take 250 mg by mouth daily.     . busPIRone (BUSPAR) 5 MG tablet Take 5 mg by mouth 2 (two) times daily.     . Butalbital-APAP-Caffeine 50-300-40 MG CAPS Take 1 capsule by mouth as needed.     . celecoxib (CELEBREX) 200 MG capsule Take 200 mg by mouth daily.     . clopidogrel (PLAVIX) 75 MG tablet Take 75 mg by mouth daily.    . cyclobenzaprine (FLEXERIL) 5 MG tablet Take 5 mg by mouth 3 (three) times daily as needed for muscle spasms.    . fexofenadine (ALLEGRA) 180 MG tablet Take 180 mg by mouth daily.    . fluticasone (FLONASE) 50 MCG/ACT nasal spray Place 2 sprays into both nostrils daily.     . Fluticasone Furoate-Vilanterol (BREO ELLIPTA) 100-25 MCG/INH AEPB Inhale 1 puff into the lungs daily.     . folic acid (FOLVITE) 1 MG tablet Take 1 mg by mouth daily.    Marland Kitchen guaiFENesin-codeine 100-10 MG/5ML syrup Take 5 mLs by mouth every 6 (six) hours as needed.     . isosorbide mononitrate (IMDUR) 30 MG 24 hr tablet Take 30 mg by mouth daily.    . metFORMIN (GLUCOPHAGE) 500 MG tablet Take 500 mg by mouth 2 (two) times daily with a meal.     . methotrexate (RHEUMATREX) 2.5 MG tablet TAKE SIX TABLETS BY MOUTH EVERY SEVEN DAYS *CALL OFFICE TO SCHEDULE LAB WORK*    . pantoprazole (PROTONIX) 20 MG tablet Take 40 mg by mouth daily.     . polyethylene glycol-electrolytes (NULYTELY/GOLYTELY) 420 g solution Take 4,000 mLs by mouth once.    . tamsulosin (FLOMAX) 0.4 MG CAPS capsule Take 1 capsule (0.4 mg total) by mouth daily. 30 capsule 3  . [START ON 08/07/2015] metroNIDAZOLE (FLAGYL) 500 MG tablet Take one (1) tablet at 6 PM and one (1) tablet at 11 PM the evening prior to surgery. 2 tablet 0  . [START ON 08/07/2015] neomycin (MYCIFRADIN) 500 MG tablet Take two (2) tablets at 6 pm and two (2) tablets at 11 pm the evening prior to surgery. 4 tablet 0  . polyethylene glycol powder (GLYCOLAX/MIRALAX) powder 255 grams one bottle for colonoscopy prep 255 g 0   No current facility-administered medications for this visit.    Review of Systems Review of Systems  Constitutional: Negative.  Negative for unexpected weight change.  HENT: Negative.   Eyes: Negative.   Respiratory: Negative.   Cardiovascular: Negative.   Gastrointestinal: Negative.   Endocrine: Negative.   Genitourinary: Negative.   Musculoskeletal: Negative.   Skin: Negative.   Allergic/Immunologic: Negative.   Neurological: Negative.   Hematological: Negative.   Psychiatric/Behavioral: Negative.     Blood pressure 130/80, pulse 68, resp. rate 14, height 5\' 11"  (1.803 m), weight 230 lb (104.327 kg).  Physical Exam Physical Exam  Constitutional: He is oriented to person, place, and time. He appears well-developed and well-nourished.  Cardiovascular: Normal rate, regular rhythm and normal heart sounds.   Pulmonary/Chest: Effort normal and breath sounds normal.  Neurological: He is alert and oriented to person, place, and time.  Skin: Skin is warm and dry.    Data Reviewed Colonoscopy competed on July 07, 2015 identified a sessile, nonobstructing mass in the  proximal ascending colon behind the ileocecal valve. Caps he showed evidence of high-grade dysplasia but no frank evidence of malignancy. A  second polyp was noted in the distal ascending colon. Angioid ectasia in the distal rectum were identified and fulgurated with argon beam. These changes were thought secondary to his previous prostate or radiation. Likely source of the rectal bleeding. Images independently reviewed.   Surgical Pathology  CASE: [id number]  PATIENT: Omar [name]  Surgical Pathology Report      SPECIMEN SUBMITTED:  A. Colon, ascending, mass, cbx  B. Colon polyp, ascending, cbx  C. Colon polyp, descending, cbx   CLINICAL HISTORY:  None provided   PRE-OPERATIVE DIAGNOSIS:  Rectal bleed   POST-OPERATIVE DIAGNOSIS:  Radiation proctitis, ascending Mas, ascending, colon polyps (marked),  diverticulosis      DIAGNOSIS:  A. COLON MASS, ASCENDING; COLD BIOPSY:  - SUPERFICIAL FRAGMENTS OF TUBULAR ADENOMA WITH AT LEAST HIGH-GRADE  DYSPLASIA.  - THIS BIOPSY MAY NOT BE REPRESENTATIVE IF IT WAS OBTAINED FROM A LARGER  LESION.  - DEEPER SECTIONS WERE EXAMINED.   B. COLON POLYP, ASCENDING; COLD BIOPSY:  - TUBULAR ADENOMA.  - NEGATIVE FOR HIGH-GRADE DYSPLASIA AND MALIGNANCY.   C. COLON POLYP, DESCENDING; COLD BIOPSY:  - COLONIC MUCOSA WITH PROMINENT LYMPHOID AGGREGATE.  - NEGATIVE FOR DYSPLASIA AND MALIGNANCY.             CT scan of the abdomen and pelvis obtained through the emergency room at the time of his 05/28/2015 assessment showed decreasing size of the aneurysm sac status post stent placement consistent with intact graft. Hepatic and renal cysts. Bilateral inguinal hernias with small bowel involving the right side. The right renal cyst is 8 cm in diameter on the superior anterior pole of the kidney. This pushes the duodenum as well as the hepatic flexure anteriorly.  The right groin hernia defect appears to be direct in nature.  Assessment     Right colon mass, high-grade dysplasia versus early malignancy. Candidate for right hemicolectomy.  Bilateral inguinal hernias, clinically not apparent and asymptomatic.  History use of methotrexate for rheumatoid arthritis. Discontinuation 3 weeks prior to this procedure will be requested.  Possible need for alternative relief of arthritic pain during the time off methotrexate may be needed.  Long smoking history, increased risk for acute cardiopulmonary compromise post procedure.  Episodic right upper quadrant pain suggestive of biliary colic.    Plan    Appointment for cardiac clearance has been scheduled with Dr. Neoma Laming for Tuesday, 07-15-15 at 2:30 pm.  Patient has also been scheduled for a gallbladder ultrasound on Wednesday, 07-16-15 at [facility] (arrive at 9:15 am). Prep: nothing to eat/drink for 6 hours prior.   This patient's surgery has been scheduled for 08-08-15 at [facility]. Patient has been asked to discontinue Plavix 7 days prior to your surgery date. It is okay to continue an 81 mg aspirin once daily. Patient to complete a bowel prep with antibiotics day prior to surgery.   The patient will be requested to discontinue any future doses of methotrexate until after surgery.  Mesh repair would be an appropriate time of colon resection. We'll assess the hernias during laparoscopic portion of the colon resection, but it is unlikely surgical repair will be undertaken at that setting.  The patient should plan on bringing his CPAP unit to the hospital for use after surgery.    PCP:  Tejan-Sie This information has been scribed by Omar [name] CMA.    Omar [name] 07/11/2015, 6:00 AM

## 2015-07-10 NOTE — Patient Instructions (Signed)
Appointment for cardiac clearance has been scheduled with Dr. Neoma Laming for Tuesday, 07-15-15 at 2:30 pm.  You have been scheduled for a gallbladder ultrasound on 07-16-15 at [facility] (arrive at 9:15 am). Prep: nothing to eat/drink for 6 hours prior.   Please discontinue Plavix 7 days prior to your surgery date. It is okay to continue an 81 mg aspirin once daily.

## 2015-07-11 ENCOUNTER — Telehealth: Payer: Self-pay

## 2015-07-11 DIAGNOSIS — K6389 Other specified diseases of intestine: Secondary | ICD-10-CM | POA: Insufficient documentation

## 2015-07-11 DIAGNOSIS — R1011 Right upper quadrant pain: Secondary | ICD-10-CM | POA: Insufficient documentation

## 2015-07-11 NOTE — Telephone Encounter (Signed)
Patient notified of Methotrexate instructions and that he may contact Dr Jefm Bryant with any concerns about arthritic pain control. He is scheduled to follow up here for a pre op visit in office on 07/30/15 at 9:15 am. Patient is aware of date, time, and instructions.

## 2015-07-11 NOTE — Telephone Encounter (Signed)
-----   Message from Robert Bellow, MD sent at 07/11/2015  6:13 AM EST ----- Please contact the patient and instructed him that he should not take any methotrexate after 07/17/2015 in preparation for his surgery in August 08, 2015. Dr. Jefm Bryant has been advised of his upcoming surgery, and can be contacted if the patient needs to make use of other medications to control his arthritic pain. Please arrange a office visit greater than 2 days prior to his 08/08/2015 surgery to review the upcoming procedure.

## 2015-07-16 ENCOUNTER — Ambulatory Visit
Admission: RE | Admit: 2015-07-16 | Discharge: 2015-07-16 | Disposition: A | Discharge status: Home / Self Care | Payer: Medicare Other | Source: Ambulatory Visit | Attending: General Surgery | Admitting: General Surgery

## 2015-07-16 DIAGNOSIS — K7689 Other specified diseases of liver: Secondary | ICD-10-CM | POA: Insufficient documentation

## 2015-07-16 DIAGNOSIS — N281 Cyst of kidney, acquired: Secondary | ICD-10-CM | POA: Diagnosis not present

## 2015-07-16 DIAGNOSIS — K6389 Other specified diseases of intestine: Secondary | ICD-10-CM

## 2015-07-16 DIAGNOSIS — R1011 Right upper quadrant pain: Secondary | ICD-10-CM | POA: Diagnosis present

## 2015-07-17 ENCOUNTER — Telehealth: Payer: Self-pay | Admitting: *Deleted

## 2015-07-17 NOTE — Telephone Encounter (Signed)
-----   Message from Robert Bellow, MD sent at 07/16/2015  5:32 PM EST ----- Please notify the patient at the ultrasound did not show any new pathology. No gallstones.  ----- Message -----    From: Rad Results In Interface    Sent: 07/16/2015  10:13 AM      To: Robert Bellow, MD

## 2015-07-17 NOTE — Telephone Encounter (Signed)
Notified patient as instructed, patient pleased. Discussed follow-up appointments, patient agrees  

## 2015-07-23 ENCOUNTER — Encounter: Payer: Self-pay | Admitting: *Deleted

## 2015-07-23 NOTE — Progress Notes (Signed)
Patient ID: Omar [name], male   DOB: 1940/07/25, 75 y.o.   MRN: [id number]  Cardiac clearance was received from Dr. April Manson office. This was faxed to the Pre-admission Department today and received per Santiago Glad.  We will proceed with surgery plans as scheduled.

## 2015-07-28 ENCOUNTER — Other Ambulatory Visit: Payer: Self-pay | Admitting: General Surgery

## 2015-07-28 ENCOUNTER — Telehealth: Payer: Self-pay | Admitting: *Deleted

## 2015-07-28 DIAGNOSIS — K635 Polyp of colon: Secondary | ICD-10-CM

## 2015-07-28 NOTE — Telephone Encounter (Signed)
Patient was contacted and notified as instructed. He verbalizes understanding.   This patient was instructed to follow up in the office with Dr. Bary Castilla as scheduled for 07-30-15 at 9:15 am.

## 2015-07-28 NOTE — Telephone Encounter (Signed)
-----   Message from Robert Bellow, MD sent at 07/28/2015  9:09 AM EST ----- Please notify the patient he needs to be off Plavix one week prior to surgery.  Notify the patient that we will be looking at his inguinal hernias at the time of surgery, but it is unlikely they will be repaired.

## 2015-07-28 NOTE — H&P (Signed)
HPI Omar [name] is a 75 y.o. male here today for a evaluation of a colon mass. Patient had an colonoscopy performed on 07/07/15. No bleeding or pain at this time. Daily bowl movements. Patient states in mid December he had a couple episodes of rectal bleeding. He was evaluated to the emergency room, referred to the GI department and then on colonoscopy.  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  Since his retirement 2 years ago he has been fairly sedentary.  The patient reports that he will have occasional right upper quadrant pain after eating greasy foods. This is not limited his diet.   The patient is accompanied by a good friend, Omar [name].  I personally reviewed the patient's history. HPI  Past Medical History  Diagnosis Date  . Anxiety   . GERD (gastroesophageal reflux disease)   . Hypertension   . Heart murmur   . Hypercholesterolemia   . Sleep apnea   . Prostate cancer ([place]) 01/01/13, 01/30/14    Gleason 3+4=7, volume 46.6 cc  . Arthritis     rheumatoid  . Atrial fibrillation (HCC)     hx of  . Rheumatoid arthritis ([place])   . S/P radiation therapy 04/03/2014 through 06/04/2014      Prostate 7800 cGy in 40 sessions   . Diabetes mellitus without complication [facility])     Past Surgical History  Procedure Laterality Date  . Tonsillectomy    . Prostate biopsy  01/01/13, 01/30/14    Gleason 3+3=6, vol 46.6 cc  . Abdominal aortic aneurysm repair  11/2013  . Nasal sinus surgery    . Uvula surgery      for sleep apnea  . Colonoscopy with propofol N/A 07/07/2015    Procedure: COLONOSCOPY WITH PROPOFOL; Surgeon: Omar Class, MD; Location: [facility] ENDOSCOPY; Service: Endoscopy; Laterality: N/A;    Family History  Problem Relation Age of Onset  .  Heart attack Mother   . Cirrhosis Father   . Cancer Brother     pancreatic  . Diabetes Brother   . Diabetes Daughter     medication induced for cancer treatments  . Cancer Daughter     breast, brain    Social History Social History  Substance Use Topics  . Smoking status: Current Every Day Smoker -- 2.00 packs/day for 50 years    Types: Cigarettes  . Smokeless tobacco: Never Used  . Alcohol Use: 0.6 oz/week    1 Cans of beer per week     Comment: occasionally    No Known Allergies  Current Outpatient Prescriptions  Medication Sig Dispense Refill  . amLODipine-benazepril (LOTREL) 5-20 MG per capsule Take 1 capsule by mouth daily.    Marland Kitchen aspirin 81 MG tablet Take 81 mg by mouth daily.    Marland Kitchen atorvastatin (LIPITOR) 80 MG tablet Take 80 mg by mouth daily.    Marland Kitchen azithromycin (ZITHROMAX) 250 MG tablet Take 250 mg by mouth daily.     . busPIRone (BUSPAR) 5 MG tablet Take 5 mg by mouth 2 (two) times daily.     . Butalbital-APAP-Caffeine 50-300-40 MG CAPS Take 1 capsule by mouth as needed.     . celecoxib (CELEBREX) 200 MG capsule Take 200 mg by mouth daily.     . clopidogrel (PLAVIX) 75 MG tablet Take 75 mg by mouth daily.    . cyclobenzaprine (FLEXERIL) 5 MG tablet Take 5 mg by mouth 3 (three) times daily as  needed for muscle spasms.    . fexofenadine (ALLEGRA) 180 MG tablet Take 180 mg by mouth daily.    . fluticasone (FLONASE) 50 MCG/ACT nasal spray Place 2 sprays into both nostrils daily.     . Fluticasone Furoate-Vilanterol (BREO ELLIPTA) 100-25 MCG/INH AEPB Inhale 1 puff into the lungs daily.     . folic acid (FOLVITE) 1 MG tablet Take 1 mg by mouth daily.    Marland Kitchen guaiFENesin-codeine 100-10 MG/5ML syrup Take 5 mLs by mouth every 6 (six) hours as needed.     . isosorbide mononitrate (IMDUR) 30 MG 24 hr tablet Take 30 mg by mouth daily.    . metFORMIN  (GLUCOPHAGE) 500 MG tablet Take 500 mg by mouth 2 (two) times daily with a meal.    . methotrexate (RHEUMATREX) 2.5 MG tablet TAKE SIX TABLETS BY MOUTH EVERY SEVEN DAYS *CALL OFFICE TO SCHEDULE LAB WORK*    . pantoprazole (PROTONIX) 20 MG tablet Take 40 mg by mouth daily.     . polyethylene glycol-electrolytes (NULYTELY/GOLYTELY) 420 g solution Take 4,000 mLs by mouth once.    . tamsulosin (FLOMAX) 0.4 MG CAPS capsule Take 1 capsule (0.4 mg total) by mouth daily. 30 capsule 3  . [START ON 08/07/2015] metroNIDAZOLE (FLAGYL) 500 MG tablet Take one (1) tablet at 6 PM and one (1) tablet at 11 PM the evening prior to surgery. 2 tablet 0  . [START ON 08/07/2015] neomycin (MYCIFRADIN) 500 MG tablet Take two (2) tablets at 6 pm and two (2) tablets at 11 pm the evening prior to surgery. 4 tablet 0  . polyethylene glycol powder (GLYCOLAX/MIRALAX) powder 255 grams one bottle for colonoscopy prep 255 g 0   No current facility-administered medications for this visit.    Review of Systems Review of Systems  Constitutional: Negative. Negative for unexpected weight change.  HENT: Negative.  Eyes: Negative.  Respiratory: Negative.  Cardiovascular: Negative.  Gastrointestinal: Negative.  Endocrine: Negative.  Genitourinary: Negative.  Musculoskeletal: Negative.  Skin: Negative.  Allergic/Immunologic: Negative.  Neurological: Negative.  Hematological: Negative.  Psychiatric/Behavioral: Negative.    Blood pressure 130/80, pulse 68, resp. rate 14, height 5\' 11"  (1.803 m), weight 230 lb (104.327 kg).  Physical Exam Physical Exam  Constitutional: He is oriented to person, place, and time. He appears well-developed and well-nourished.  Cardiovascular: Normal rate, regular rhythm and normal heart sounds.  Pulmonary/Chest: Effort normal and breath sounds normal.  Neurological: He is alert and oriented to person, place, and time.  Skin: Skin is warm and  dry.    Data Reviewed Colonoscopy competed on July 07, 2015 identified a sessile, nonobstructing mass in the proximal ascending colon behind the ileocecal valve. Caps he showed evidence of high-grade dysplasia but no frank evidence of malignancy. A second polyp was noted in the distal ascending colon. Angioid ectasia in the distal rectum were identified and fulgurated with argon beam. These changes were thought secondary to his previous prostate or radiation. Likely source of the rectal bleeding. Images independently reviewed.   Surgical Pathology  CASE: [id number]  PATIENT: Kristin Bruins  Surgical Pathology Report      SPECIMEN SUBMITTED:  A. Colon, ascending, mass, cbx  B. Colon polyp, ascending, cbx  C. Colon polyp, descending, cbx   CLINICAL HISTORY:  None provided   PRE-OPERATIVE DIAGNOSIS:  Rectal bleed   POST-OPERATIVE DIAGNOSIS:  Radiation proctitis, ascending Mas, ascending, colon polyps (marked),  diverticulosis      DIAGNOSIS:  A. COLON MASS, ASCENDING; COLD BIOPSY:  - SUPERFICIAL  FRAGMENTS OF TUBULAR ADENOMA WITH AT LEAST HIGH-GRADE  DYSPLASIA.  - THIS BIOPSY MAY NOT BE REPRESENTATIVE IF IT WAS OBTAINED FROM A LARGER  LESION.  - DEEPER SECTIONS WERE EXAMINED.   B. COLON POLYP, ASCENDING; COLD BIOPSY:  - TUBULAR ADENOMA.  - NEGATIVE FOR HIGH-GRADE DYSPLASIA AND MALIGNANCY.   C. COLON POLYP, DESCENDING; COLD BIOPSY:  - COLONIC MUCOSA WITH PROMINENT LYMPHOID AGGREGATE.  - NEGATIVE FOR DYSPLASIA AND MALIGNANCY.             CT scan of the abdomen and pelvis obtained through the emergency room at the time of his 05/28/2015 assessment showed decreasing size of the aneurysm sac status post stent placement consistent with intact graft. Hepatic and renal cysts. Bilateral inguinal hernias with small bowel involving the right side. The right renal cyst is 8 cm in diameter on the superior anterior pole of the kidney. This pushes the duodenum as well  as the hepatic flexure anteriorly.  The right groin hernia defect appears to be direct in nature.  Assessment    Right colon mass, high-grade dysplasia versus early malignancy. Candidate for right hemicolectomy.  Bilateral inguinal hernias, clinically not apparent and asymptomatic.  History use of methotrexate for rheumatoid arthritis. Discontinuation 3 weeks prior to this procedure will be requested.  Possible need for alternative relief of arthritic pain during the time off methotrexate may be needed.  Long smoking history, increased risk for acute cardiopulmonary compromise post procedure.  Episodic right upper quadrant pain suggestive of biliary colic.    Plan    Appointment for cardiac clearance has been scheduled with Dr. Neoma Laming for Tuesday, 07-15-15 at 2:30 pm.  Patient has also been scheduled for a gallbladder ultrasound on Wednesday, 07-16-15 at [facility] (arrive at 9:15 am). Prep: nothing to eat/drink for 6 hours prior.   This patient's surgery has been scheduled for 08-08-15 at [facility] Patient has been asked to discontinue Plavix 7 days prior to your surgery date. It is okay to continue an 81 mg aspirin once daily. Patient to complete a bowel prep with antibiotics day prior to surgery.   The patient will be requested to discontinue any future doses of methotrexate until after surgery.  Mesh repair would be an appropriate time of colon resection. We'll assess the hernias during laparoscopic portion of the colon resection, but it is unlikely surgical repair will be undertaken at that setting.  The patient should plan on bringing his CPAP unit to the hospital for use after surgery.    PCP: Tejan-Sie This information has been scribed by Gaspar Cola CMA.    Robert Bellow 07/11/2015, 6:00 AM

## 2015-07-30 ENCOUNTER — Encounter: Payer: Self-pay | Admitting: General Surgery

## 2015-07-30 ENCOUNTER — Ambulatory Visit (INDEPENDENT_AMBULATORY_CARE_PROVIDER_SITE_OTHER): Payer: Medicare Other | Admitting: General Surgery

## 2015-07-30 VITALS — BP 132/70 | HR 48 | Resp 18 | Ht 71.0 in | Wt 225.0 lb

## 2015-07-30 DIAGNOSIS — K6389 Other specified diseases of intestine: Secondary | ICD-10-CM

## 2015-07-30 DIAGNOSIS — K4020 Bilateral inguinal hernia, without obstruction or gangrene, not specified as recurrent: Secondary | ICD-10-CM | POA: Diagnosis not present

## 2015-07-30 NOTE — Progress Notes (Signed)
Patient ID: Omar [name], male   DOB: November 26, 1940, 75 y.o.   MRN: [id number]  Chief Complaint  Patient presents with  . Pre-op Exam    HPI Omar [name] is a 75 y.o. male.  Here today for preop visit, right colon resection for colon mass on 08-08-15.  Abdominal ultrasound was completed on 07-16-15 cousin of his complaints of right upper quadrant pain . This study was normal. He was asked to return to discuss the identification of his hernias on CT imaging.  The patient is accompanied by a good friend, [name].   HPI  Past Medical History  Diagnosis Date  . Anxiety   . GERD (gastroesophageal reflux disease)   . Hypertension   . Heart murmur   . Hypercholesterolemia   . Sleep apnea   . Prostate cancer ([place]) 01/01/13, 01/30/14    Gleason 3+4=7, volume 46.6 cc  . Arthritis     rheumatoid  . Atrial fibrillation (HCC)     hx of  . Rheumatoid arthritis ([place])   . S/P radiation therapy  04/03/2014 through 06/04/2014                                                       Prostate 7800 cGy in 40 sessions                          . Diabetes mellitus without complication ([place])   . Colon polyp 07-07-15    TUBULAR ADENOMA WITH AT LEAST HIGH-GRADE / Dr Rayann Heman    Past Surgical History  Procedure Laterality Date  . Tonsillectomy    . Prostate biopsy  01/01/13, 01/30/14    Gleason 3+3=6, vol 46.6 cc  . Abdominal aortic aneurysm repair  11/2013  . Nasal sinus surgery    . Uvula surgery      for sleep apnea  . Colonoscopy with propofol N/A 07/07/2015    Procedure: COLONOSCOPY WITH PROPOFOL;  Surgeon: Josefine Class, MD;  Location: [facility] ENDOSCOPY;  Service: Endoscopy;  Laterality: N/A;    Family History  Problem Relation Age of Onset  . Heart attack Mother   . Cirrhosis Father   . Cancer Brother     pancreatic  . Diabetes Brother   . Diabetes Daughter     medication induced for cancer treatments  . Cancer Daughter     breast, brain    Social History Social History   Substance Use Topics  . Smoking status: Current Every Day Smoker -- 2.00 packs/day for 50 years    Types: Cigarettes  . Smokeless tobacco: Never Used  . Alcohol Use: 0.6 oz/week    1 Cans of beer per week     Comment: occasionally    No Known Allergies  Current Outpatient Prescriptions  Medication Sig Dispense Refill  . amLODipine-benazepril (LOTREL) 5-20 MG per capsule Take 1 capsule by mouth daily.    Marland Kitchen aspirin 81 MG tablet Take 81 mg by mouth daily.    Marland Kitchen atorvastatin (LIPITOR) 80 MG tablet Take 80 mg by mouth daily.    . busPIRone (BUSPAR) 5 MG tablet Take 5 mg by mouth 2 (two) times daily.     . Butalbital-APAP-Caffeine 50-300-40 MG CAPS Take 1 capsule by mouth as needed.     . celecoxib (CELEBREX)  200 MG capsule Take 200 mg by mouth daily.     . clopidogrel (PLAVIX) 75 MG tablet Take 75 mg by mouth daily.    . cyclobenzaprine (FLEXERIL) 5 MG tablet Take 5 mg by mouth 3 (three) times daily as needed for muscle spasms.    . fexofenadine (ALLEGRA) 180 MG tablet Take 180 mg by mouth daily.    . fluticasone (FLONASE) 50 MCG/ACT nasal spray Place 2 sprays into both nostrils daily.     . Fluticasone Furoate-Vilanterol (BREO ELLIPTA) 100-25 MCG/INH AEPB Inhale 1 puff into the lungs daily.     . folic acid (FOLVITE) 1 MG tablet Take 1 mg by mouth daily.    . isosorbide mononitrate (IMDUR) 30 MG 24 hr tablet Take 30 mg by mouth daily.    . metFORMIN (GLUCOPHAGE) 500 MG tablet Take 500 mg by mouth 2 (two) times daily with a meal.    . [START ON 08/07/2015] metroNIDAZOLE (FLAGYL) 500 MG tablet Take one (1) tablet at 6 PM and one (1) tablet at 11 PM the evening prior to surgery. 2 tablet 0  . [START ON 08/07/2015] neomycin (MYCIFRADIN) 500 MG tablet Take two (2) tablets at 6 pm and two (2) tablets at 11 pm the evening prior to surgery. 4 tablet 0  . pantoprazole (PROTONIX) 20 MG tablet Take 40 mg by mouth daily.     . polyethylene glycol powder (GLYCOLAX/MIRALAX) powder 255 grams one bottle for  colonoscopy prep 255 g 0  . polyethylene glycol-electrolytes (NULYTELY/GOLYTELY) 420 g solution Take 4,000 mLs by mouth once.    . tamsulosin (FLOMAX) 0.4 MG CAPS capsule Take 1 capsule (0.4 mg total) by mouth daily. (Patient not taking: Reported on 07/31/2015) 30 capsule 3   No current facility-administered medications for this visit.    Review of Systems Review of Systems  Constitutional: Negative.   Respiratory: Negative.   Cardiovascular: Negative.     Blood pressure 132/70, pulse 48, resp. rate 18, height 5\' 11"  (1.803 m), weight 225 lb (102.059 kg).  Physical Exam Physical Exam  Constitutional: He is oriented to person, place, and time. He appears well-developed and well-nourished.  HENT:  Mouth/Throat: Oropharynx is clear and moist.  Eyes: Conjunctivae are normal. No scleral icterus.  Neck: Neck supple.  Cardiovascular: Normal rate and normal heart sounds.  An irregular rhythm present.  Pulmonary/Chest: Effort normal and breath sounds normal.  Abdominal: Soft. Normal appearance and bowel sounds are normal. There is no tenderness. A hernia is present. Hernia confirmed positive in the right inguinal area and confirmed positive in the left inguinal area.  Lymphadenopathy:    He has no cervical adenopathy.  Neurological: He is alert and oriented to person, place, and time.  Skin: Skin is warm and dry.  Psychiatric: His behavior is normal.    Data Reviewed Abdominal ultrasound: No evidence cholelithiasis.  CT reports bilateral inguinal hernias with small intestine on the right side. (Clinical exam showed a reducible hernias bilaterally) sees.  Assessment    Right colon polyp with at least high-grade dysplasia.  Previously unknown and asymptomatic bilateral inguinal hernias.    Plan    Plans are to proceed with lap assisted right hemicolectomy. The hernias will be visualized at that time. As prosthetic mesh would normally be utilized, and while an absorbable mesh might  be appropriate, we'll assess them to determine risk for incarceration before elective repair at another date.     Stop plavix on 08-01-15-one week prior to surgery.  He has  already stopped his methotrexate.  PCP:  Dorisann Frames This information has been scribed by Karie Fetch RNBC.   Robert Bellow 07/31/2015, 5:31 PM

## 2015-07-30 NOTE — Patient Instructions (Addendum)
The patient is aware to call back for any questions or concerns. Stop plavix on 08-01-15- one week before surgery

## 2015-07-31 ENCOUNTER — Encounter
Admission: RE | Admit: 2015-07-31 | Discharge: 2015-07-31 | Disposition: A | Discharge status: Home / Self Care | Payer: Medicare Other | Source: Ambulatory Visit | Attending: General Surgery | Admitting: General Surgery

## 2015-07-31 DIAGNOSIS — Z8546 Personal history of malignant neoplasm of prostate: Secondary | ICD-10-CM | POA: Insufficient documentation

## 2015-07-31 DIAGNOSIS — Z01812 Encounter for preprocedural laboratory examination: Secondary | ICD-10-CM | POA: Insufficient documentation

## 2015-07-31 DIAGNOSIS — I10 Essential (primary) hypertension: Secondary | ICD-10-CM | POA: Diagnosis not present

## 2015-07-31 DIAGNOSIS — K6389 Other specified diseases of intestine: Secondary | ICD-10-CM | POA: Diagnosis not present

## 2015-07-31 DIAGNOSIS — F419 Anxiety disorder, unspecified: Secondary | ICD-10-CM | POA: Diagnosis not present

## 2015-07-31 DIAGNOSIS — R011 Cardiac murmur, unspecified: Secondary | ICD-10-CM | POA: Insufficient documentation

## 2015-07-31 DIAGNOSIS — K219 Gastro-esophageal reflux disease without esophagitis: Secondary | ICD-10-CM | POA: Diagnosis not present

## 2015-07-31 DIAGNOSIS — E7800 Pure hypercholesterolemia, unspecified: Secondary | ICD-10-CM | POA: Insufficient documentation

## 2015-07-31 DIAGNOSIS — K469 Unspecified abdominal hernia without obstruction or gangrene: Secondary | ICD-10-CM | POA: Insufficient documentation

## 2015-07-31 LAB — CBC WITH DIFFERENTIAL/PLATELET
BASOS PCT: 0 %
Basophils Absolute: 0 10*3/uL (ref 0–0.1)
EOS ABS: 0.2 10*3/uL (ref 0–0.7)
Eosinophils Relative: 2 %
HEMATOCRIT: 37.8 % — AB (ref 40.0–52.0)
Hemoglobin: 12.4 g/dL — ABNORMAL LOW (ref 13.0–18.0)
LYMPHS ABS: 0.9 10*3/uL — AB (ref 1.0–3.6)
LYMPHS PCT: 10 %
MCH: 29 pg (ref 26.0–34.0)
MCHC: 32.9 g/dL (ref 32.0–36.0)
MCV: 88.1 fL (ref 80.0–100.0)
MONO ABS: 0.9 10*3/uL (ref 0.2–1.0)
MONOS PCT: 9 %
NEUTROS ABS: 7.3 10*3/uL — AB (ref 1.4–6.5)
NEUTROS PCT: 79 %
Platelets: 257 10*3/uL (ref 150–440)
RBC: 4.29 MIL/uL — ABNORMAL LOW (ref 4.40–5.90)
RDW: 18.8 % — AB (ref 11.5–14.5)
WBC: 9.3 10*3/uL (ref 3.8–10.6)

## 2015-07-31 LAB — BASIC METABOLIC PANEL
ANION GAP: 9 (ref 5–15)
BUN: 19 mg/dL (ref 6–20)
CALCIUM: 9 mg/dL (ref 8.9–10.3)
CHLORIDE: 110 mmol/L (ref 101–111)
CO2: 22 mmol/L (ref 22–32)
Creatinine, Ser: 1.26 mg/dL — ABNORMAL HIGH (ref 0.61–1.24)
GFR calc Af Amer: >60 mL/min (ref >60)
GFR calc non Af Amer: 55 mL/min — ABNORMAL LOW (ref >60)
GLUCOSE: 90 mg/dL (ref 65–99)
Potassium: 4 mmol/L (ref 3.5–5.1)
Sodium: 141 mmol/L (ref 135–145)

## 2015-07-31 LAB — SURGICAL PCR SCREEN
MRSA, PCR: NEGATIVE
STAPHYLOCOCCUS AUREUS: NEGATIVE

## 2015-07-31 NOTE — Patient Instructions (Signed)
  Your procedure is scheduled on: Friday 08/08/15 Report to Day Surgery. 2ND FLOOR MEDICAL MALL ENTRANCE To find out your arrival time please call [phone] between 1PM - 3PM on Thursday 08/07/15.  Remember: Instructions that are not followed completely may result in serious medical risk, up to and including death, or upon the discretion of your surgeon and anesthesiologist your surgery may need to be rescheduled.    __X__ 1. Do not eat food or drink liquids after midnight. No gum chewing or hard candies.     __X__ 2. No Alcohol for 24 hours before or after surgery.   ____ 3. Bring all medications with you on the day of surgery if instructed.    __X__ 4. Notify your doctor if there is any change in your medical condition     (cold, fever, infections).     Do not wear jewelry, make-up, hairpins, clips or nail polish.  Do not wear lotions, powders, or perfumes. .  Do not shave 48 hours prior to surgery. Men may shave face and neck.  Do not bring valuables to the hospital.    [facility] is not responsible for any belongings or valuables.               Contacts, dentures or bridgework may not be worn into surgery.  Leave your suitcase in the car. After surgery it may be brought to your room.  For patients admitted to the hospital, discharge time is determined by your                treatment team.   Patients discharged the day of surgery will not be allowed to drive home.   Please read over the following fact sheets that you were given:   MRSA Information and Surgical Site Infection Prevention   __X__ Take these medicines the morning of surgery with A SIP OF WATER:    1. AMLODIPINE  2. ATORVASTATIN  3. BUSPIRONE  4. FEXOFENADINE  5. ISOSORBIDE MONONITRATE  6. PANTOPRAZOLE  ____ Fleet Enema (as directed)   __X__ Use CHG Soap as directed  __X__ Use inhalers on the day of surgery  __X__ Stop metformin 2 days prior to surgery    ____ Take 1/2 of usual insulin dose the night  before surgery and none on the morning of surgery.   __X__ Stop Coumadin/PLAVIX/aspirin on AS DIRECTED BY DR BYRNETT  ____ Stop Anti-inflammatories on    ____ Stop supplements until after surgery.    __X__ Bring C-Pap to the hospital.

## 2015-08-01 LAB — CEA: CEA: 3.5 ng/mL (ref 0.0–4.7)

## 2015-08-06 HISTORY — PX: COLON SURGERY: SHX602

## 2015-08-08 ENCOUNTER — Encounter: Payer: Self-pay | Admitting: *Deleted

## 2015-08-08 ENCOUNTER — Inpatient Hospital Stay: Payer: Medicare Other | Admitting: Anesthesiology

## 2015-08-08 ENCOUNTER — Inpatient Hospital Stay
Admission: RE | Admit: 2015-08-08 | Discharge: 2015-08-12 | DRG: 330 | Disposition: A | Discharge status: Home / Self Care | Payer: Medicare Other | Source: Ambulatory Visit | Attending: General Surgery | Admitting: General Surgery

## 2015-08-08 ENCOUNTER — Encounter
Admission: RE | Disposition: A | Discharge status: Home / Self Care | Payer: Self-pay | Source: Ambulatory Visit | Attending: General Surgery

## 2015-08-08 DIAGNOSIS — Z7982 Long term (current) use of aspirin: Secondary | ICD-10-CM | POA: Diagnosis not present

## 2015-08-08 DIAGNOSIS — Z7984 Long term (current) use of oral hypoglycemic drugs: Secondary | ICD-10-CM

## 2015-08-08 DIAGNOSIS — E7800 Pure hypercholesterolemia, unspecified: Secondary | ICD-10-CM | POA: Diagnosis present

## 2015-08-08 DIAGNOSIS — I4891 Unspecified atrial fibrillation: Secondary | ICD-10-CM | POA: Diagnosis present

## 2015-08-08 DIAGNOSIS — E119 Type 2 diabetes mellitus without complications: Secondary | ICD-10-CM | POA: Diagnosis present

## 2015-08-08 DIAGNOSIS — Z7902 Long term (current) use of antithrombotics/antiplatelets: Secondary | ICD-10-CM

## 2015-08-08 DIAGNOSIS — F419 Anxiety disorder, unspecified: Secondary | ICD-10-CM | POA: Diagnosis present

## 2015-08-08 DIAGNOSIS — I4892 Unspecified atrial flutter: Secondary | ICD-10-CM | POA: Diagnosis present

## 2015-08-08 DIAGNOSIS — R001 Bradycardia, unspecified: Secondary | ICD-10-CM | POA: Diagnosis not present

## 2015-08-08 DIAGNOSIS — Z8546 Personal history of malignant neoplasm of prostate: Secondary | ICD-10-CM

## 2015-08-08 DIAGNOSIS — K219 Gastro-esophageal reflux disease without esophagitis: Secondary | ICD-10-CM | POA: Diagnosis present

## 2015-08-08 DIAGNOSIS — D122 Benign neoplasm of ascending colon: Principal | ICD-10-CM | POA: Diagnosis present

## 2015-08-08 DIAGNOSIS — N281 Cyst of kidney, acquired: Secondary | ICD-10-CM | POA: Diagnosis present

## 2015-08-08 DIAGNOSIS — I959 Hypotension, unspecified: Secondary | ICD-10-CM | POA: Diagnosis not present

## 2015-08-08 DIAGNOSIS — I10 Essential (primary) hypertension: Secondary | ICD-10-CM | POA: Diagnosis present

## 2015-08-08 DIAGNOSIS — F17210 Nicotine dependence, cigarettes, uncomplicated: Secondary | ICD-10-CM | POA: Diagnosis present

## 2015-08-08 DIAGNOSIS — M069 Rheumatoid arthritis, unspecified: Secondary | ICD-10-CM | POA: Diagnosis present

## 2015-08-08 DIAGNOSIS — G4730 Sleep apnea, unspecified: Secondary | ICD-10-CM | POA: Diagnosis present

## 2015-08-08 DIAGNOSIS — I441 Atrioventricular block, second degree: Secondary | ICD-10-CM | POA: Diagnosis not present

## 2015-08-08 DIAGNOSIS — K635 Polyp of colon: Secondary | ICD-10-CM | POA: Diagnosis present

## 2015-08-08 DIAGNOSIS — D374 Neoplasm of uncertain behavior of colon: Secondary | ICD-10-CM

## 2015-08-08 HISTORY — PX: LAPAROSCOPIC RIGHT COLECTOMY: SHX5925

## 2015-08-08 LAB — GLUCOSE, CAPILLARY
Glucose-Capillary: 125 mg/dL — ABNORMAL HIGH (ref 65–99)
Glucose-Capillary: 161 mg/dL — ABNORMAL HIGH (ref 65–99)

## 2015-08-08 LAB — BASIC METABOLIC PANEL
Anion gap: 8 (ref 5–15)
BUN: 13 mg/dL (ref 6–20)
CHLORIDE: 115 mmol/L — AB (ref 101–111)
CO2: 18 mmol/L — AB (ref 22–32)
CREATININE: 1.59 mg/dL — AB (ref 0.61–1.24)
Calcium: 7.9 mg/dL — ABNORMAL LOW (ref 8.9–10.3)
GFR calc Af Amer: 48 mL/min — ABNORMAL LOW (ref >60)
GFR calc non Af Amer: 41 mL/min — ABNORMAL LOW (ref >60)
GLUCOSE: 141 mg/dL — AB (ref 65–99)
Potassium: 4.4 mmol/L (ref 3.5–5.1)
SODIUM: 141 mmol/L (ref 135–145)

## 2015-08-08 LAB — CBC WITH DIFFERENTIAL/PLATELET
Basophils Absolute: 0 10*3/uL (ref 0–0.1)
Basophils Relative: 0 %
EOS ABS: 0 10*3/uL (ref 0–0.7)
Eosinophils Relative: 0 %
HCT: 35.4 % — ABNORMAL LOW (ref 40.0–52.0)
HEMOGLOBIN: 11.5 g/dL — AB (ref 13.0–18.0)
LYMPHS ABS: 0.4 10*3/uL — AB (ref 1.0–3.6)
Lymphocytes Relative: 3 %
MCH: 27.9 pg (ref 26.0–34.0)
MCHC: 32.4 g/dL (ref 32.0–36.0)
MCV: 86.2 fL (ref 80.0–100.0)
MONOS PCT: 4 %
Monocytes Absolute: 0.6 10*3/uL (ref 0.2–1.0)
NEUTROS PCT: 93 %
Neutro Abs: 15.6 10*3/uL — ABNORMAL HIGH (ref 1.4–6.5)
Platelets: 218 10*3/uL (ref 150–440)
RBC: 4.11 MIL/uL — ABNORMAL LOW (ref 4.40–5.90)
RDW: 19.5 % — ABNORMAL HIGH (ref 11.5–14.5)
WBC: 16.7 10*3/uL — ABNORMAL HIGH (ref 3.8–10.6)

## 2015-08-08 SURGERY — COLECTOMY, RIGHT, LAPAROSCOPIC
Anesthesia: General | Laterality: Right | Wound class: Clean Contaminated

## 2015-08-08 MED ORDER — NEOSTIGMINE METHYLSULFATE 10 MG/10ML IV SOLN
INTRAVENOUS | Status: DC | PRN
  Administered 2015-08-08: 4 mg via INTRAVENOUS

## 2015-08-08 MED ORDER — LACTATED RINGERS IV BOLUS (SEPSIS)
500.0000 mL | Freq: Once | INTRAVENOUS | Status: AC
  Administered 2015-08-08: 500 mL via INTRAVENOUS

## 2015-08-08 MED ORDER — ENOXAPARIN SODIUM 40 MG/0.4ML ~~LOC~~ SOLN
40.0000 mg | SUBCUTANEOUS | Status: DC
  Administered 2015-08-09 – 2015-08-12 (×4): 40 mg via SUBCUTANEOUS
  Filled 2015-08-08 (×4): qty 0.4

## 2015-08-08 MED ORDER — EPHEDRINE SULFATE 50 MG/ML IJ SOLN
5.0000 mg | Freq: Once | INTRAMUSCULAR | Status: AC
  Administered 2015-08-08: 5 mg via INTRAVENOUS

## 2015-08-08 MED ORDER — ACETAMINOPHEN 10 MG/ML IV SOLN
INTRAVENOUS | Status: DC | PRN
  Administered 2015-08-08: 1000 mg via INTRAVENOUS

## 2015-08-08 MED ORDER — FLUTICASONE PROPIONATE 50 MCG/ACT NA SUSP
2.0000 | Freq: Every day | NASAL | Status: DC
  Administered 2015-08-09 – 2015-08-12 (×4): 2 via NASAL
  Filled 2015-08-08: qty 16

## 2015-08-08 MED ORDER — BUSPIRONE HCL 5 MG PO TABS
5.0000 mg | ORAL_TABLET | Freq: Two times a day (BID) | ORAL | Status: DC
  Administered 2015-08-08 – 2015-08-12 (×8): 5 mg via ORAL
  Filled 2015-08-08 (×10): qty 1

## 2015-08-08 MED ORDER — PROPOFOL 10 MG/ML IV BOLUS
INTRAVENOUS | Status: DC | PRN
  Administered 2015-08-08: 200 mg via INTRAVENOUS

## 2015-08-08 MED ORDER — ASPIRIN EC 81 MG PO TBEC
81.0000 mg | DELAYED_RELEASE_TABLET | Freq: Every day | ORAL | Status: DC
  Administered 2015-08-09 – 2015-08-12 (×4): 81 mg via ORAL
  Filled 2015-08-08 (×4): qty 1

## 2015-08-08 MED ORDER — SODIUM CHLORIDE 0.9 % IJ SOLN
INTRAMUSCULAR | Status: AC
Start: 2015-08-08 — End: 2015-08-08
  Filled 2015-08-08: qty 10

## 2015-08-08 MED ORDER — GLYCOPYRROLATE 0.2 MG/ML IJ SOLN
INTRAMUSCULAR | Status: DC | PRN
  Administered 2015-08-08: 0.6 mg via INTRAVENOUS

## 2015-08-08 MED ORDER — EPHEDRINE SULFATE 50 MG/ML IJ SOLN
10.0000 mg | Freq: Once | INTRAMUSCULAR | Status: AC
  Administered 2015-08-08: 10 mg via INTRAVENOUS

## 2015-08-08 MED ORDER — ONDANSETRON HCL 4 MG/2ML IJ SOLN: 4.0000 mg | Freq: Four times a day (QID) | INTRAMUSCULAR | Status: DC | PRN

## 2015-08-08 MED ORDER — NALOXONE HCL 0.4 MG/ML IJ SOLN: 0.4000 mg | INTRAMUSCULAR | Status: DC | PRN

## 2015-08-08 MED ORDER — SODIUM CHLORIDE 0.9 % IV SOLN
INTRAVENOUS | Status: DC
  Administered 2015-08-08 (×3): via INTRAVENOUS

## 2015-08-08 MED ORDER — MORPHINE SULFATE 2 MG/ML IV SOLN: INTRAVENOUS | Status: DC

## 2015-08-08 MED ORDER — ACETAMINOPHEN 10 MG/ML IV SOLN
1000.0000 mg | Freq: Four times a day (QID) | INTRAVENOUS | Status: AC
  Administered 2015-08-08 – 2015-08-09 (×4): 1000 mg via INTRAVENOUS
  Filled 2015-08-08 (×4): qty 100

## 2015-08-08 MED ORDER — PANTOPRAZOLE SODIUM 40 MG PO TBEC
40.0000 mg | DELAYED_RELEASE_TABLET | Freq: Every day | ORAL | Status: DC
  Administered 2015-08-09 – 2015-08-12 (×4): 40 mg via ORAL
  Filled 2015-08-08 (×4): qty 1

## 2015-08-08 MED ORDER — HYDROMORPHONE HCL 1 MG/ML IJ SOLN
0.2500 mg | INTRAMUSCULAR | Status: DC | PRN
  Administered 2015-08-08 (×2): 0.5 mg via INTRAVENOUS

## 2015-08-08 MED ORDER — KETOROLAC TROMETHAMINE 15 MG/ML IJ SOLN
INTRAMUSCULAR | Status: DC | PRN
  Administered 2015-08-08: 15 mg via INTRAVENOUS

## 2015-08-08 MED ORDER — SODIUM CHLORIDE 0.9 % IV BOLUS (SEPSIS): 500.0000 mL | Freq: Once | INTRAVENOUS | Status: DC

## 2015-08-08 MED ORDER — TAMSULOSIN HCL 0.4 MG PO CAPS
0.4000 mg | ORAL_CAPSULE | Freq: Every day | ORAL | Status: DC
  Administered 2015-08-09 – 2015-08-12 (×4): 0.4 mg via ORAL
  Filled 2015-08-08 (×4): qty 1

## 2015-08-08 MED ORDER — DIPHENHYDRAMINE HCL 12.5 MG/5ML PO ELIX
12.5000 mg | ORAL_SOLUTION | Freq: Four times a day (QID) | ORAL | Status: DC | PRN
  Filled 2015-08-08: qty 5

## 2015-08-08 MED ORDER — SUCCINYLCHOLINE CHLORIDE 20 MG/ML IJ SOLN
INTRAMUSCULAR | Status: DC | PRN
  Administered 2015-08-08: 100 mg via INTRAVENOUS

## 2015-08-08 MED ORDER — SODIUM CHLORIDE 0.9 % IV SOLN
1.0000 g | INTRAVENOUS | Status: AC
  Administered 2015-08-08: 1 g via INTRAVENOUS
  Filled 2015-08-08: qty 1

## 2015-08-08 MED ORDER — MORPHINE SULFATE (PF) 2 MG/ML IV SOLN
2.0000 mg | INTRAVENOUS | Status: DC | PRN
  Administered 2015-08-08 – 2015-08-10 (×7): 2 mg via INTRAVENOUS
  Filled 2015-08-08 (×7): qty 1

## 2015-08-08 MED ORDER — EPHEDRINE SULFATE 50 MG/ML IJ SOLN
INTRAMUSCULAR | Status: AC
  Administered 2015-08-08: 5 mg via INTRAVENOUS
  Filled 2015-08-08: qty 1

## 2015-08-08 MED ORDER — ACETAMINOPHEN 325 MG PO TABS: 650.0000 mg | ORAL_TABLET | ORAL | Status: DC

## 2015-08-08 MED ORDER — DEXAMETHASONE SODIUM PHOSPHATE 10 MG/ML IJ SOLN
INTRAMUSCULAR | Status: DC | PRN
  Administered 2015-08-08: 5 mg via INTRAVENOUS

## 2015-08-08 MED ORDER — ISOSORBIDE MONONITRATE ER 30 MG PO TB24: 30.0000 mg | ORAL_TABLET | Freq: Every day | ORAL | Status: DC

## 2015-08-08 MED ORDER — ONDANSETRON HCL 4 MG/2ML IJ SOLN: 4.0000 mg | Freq: Once | INTRAMUSCULAR | Status: DC | PRN

## 2015-08-08 MED ORDER — SODIUM CHLORIDE 0.9 % IV BOLUS (SEPSIS)
500.0000 mL | Freq: Once | INTRAVENOUS | Status: AC
  Administered 2015-08-08: 500 mL via INTRAVENOUS

## 2015-08-08 MED ORDER — FENTANYL CITRATE (PF) 250 MCG/5ML IJ SOLN
INTRAMUSCULAR | Status: DC | PRN
  Administered 2015-08-08: 50 ug via INTRAVENOUS

## 2015-08-08 MED ORDER — ALVIMOPAN 12 MG PO CAPS
ORAL_CAPSULE | ORAL | Status: AC
  Administered 2015-08-08: 12 mg via ORAL
  Filled 2015-08-08: qty 1

## 2015-08-08 MED ORDER — SODIUM CHLORIDE 0.9% FLUSH: 9.0000 mL | INTRAVENOUS | Status: DC | PRN

## 2015-08-08 MED ORDER — HYDROMORPHONE HCL 1 MG/ML IJ SOLN
INTRAMUSCULAR | Status: AC
  Administered 2015-08-08: 0.5 mg via INTRAVENOUS
  Filled 2015-08-08: qty 1

## 2015-08-08 MED ORDER — KETOROLAC TROMETHAMINE 15 MG/ML IJ SOLN
15.0000 mg | Freq: Three times a day (TID) | INTRAMUSCULAR | Status: DC
  Administered 2015-08-08 – 2015-08-11 (×9): 15 mg via INTRAVENOUS
  Filled 2015-08-08 (×9): qty 1

## 2015-08-08 MED ORDER — ACETAMINOPHEN 10 MG/ML IV SOLN
INTRAVENOUS | Status: AC
Start: 2015-08-08 — End: 2015-08-08
  Filled 2015-08-08: qty 100

## 2015-08-08 MED ORDER — ONDANSETRON HCL 4 MG/2ML IJ SOLN
INTRAMUSCULAR | Status: DC | PRN
  Administered 2015-08-08: 4 mg via INTRAVENOUS

## 2015-08-08 MED ORDER — LIDOCAINE HCL (CARDIAC) 20 MG/ML IV SOLN
INTRAVENOUS | Status: DC | PRN
  Administered 2015-08-08: 80 mg via INTRAVENOUS

## 2015-08-08 MED ORDER — ALVIMOPAN 12 MG PO CAPS
12.0000 mg | ORAL_CAPSULE | Freq: Two times a day (BID) | ORAL | Status: DC
  Administered 2015-08-09: 12 mg via ORAL
  Filled 2015-08-08 (×5): qty 1

## 2015-08-08 MED ORDER — ROCURONIUM BROMIDE 100 MG/10ML IV SOLN
INTRAVENOUS | Status: DC | PRN
  Administered 2015-08-08: 20 mg via INTRAVENOUS
  Administered 2015-08-08: 10 mg via INTRAVENOUS
  Administered 2015-08-08 (×2): 20 mg via INTRAVENOUS
  Administered 2015-08-08: 10 mg via INTRAVENOUS

## 2015-08-08 MED ORDER — ONDANSETRON 4 MG PO TBDP
4.0000 mg | ORAL_TABLET | Freq: Four times a day (QID) | ORAL | Status: DC | PRN
  Filled 2015-08-08: qty 1

## 2015-08-08 MED ORDER — ALVIMOPAN 12 MG PO CAPS
12.0000 mg | ORAL_CAPSULE | Freq: Once | ORAL | Status: AC
  Administered 2015-08-08: 12 mg via ORAL

## 2015-08-08 MED ORDER — MIDAZOLAM HCL 5 MG/5ML IJ SOLN
INTRAMUSCULAR | Status: DC | PRN
  Administered 2015-08-08: 2 mg via INTRAVENOUS

## 2015-08-08 MED ORDER — EPHEDRINE SULFATE 50 MG/ML IJ SOLN
INTRAMUSCULAR | Status: DC | PRN
  Administered 2015-08-08: 10 mg via INTRAVENOUS

## 2015-08-08 MED ORDER — DIPHENHYDRAMINE HCL 50 MG/ML IJ SOLN: 12.5000 mg | Freq: Four times a day (QID) | INTRAMUSCULAR | Status: DC | PRN

## 2015-08-08 MED ORDER — LACTATED RINGERS IV SOLN
INTRAVENOUS | Status: DC
  Administered 2015-08-08 – 2015-08-09 (×2): via INTRAVENOUS

## 2015-08-08 MED ORDER — ACETAMINOPHEN 650 MG RE SUPP: 650.0000 mg | RECTAL | Status: DC

## 2015-08-08 MED ORDER — CYCLOBENZAPRINE HCL 10 MG PO TABS
5.0000 mg | ORAL_TABLET | Freq: Three times a day (TID) | ORAL | Status: DC | PRN
  Administered 2015-08-11: 5 mg via ORAL
  Filled 2015-08-08: qty 1

## 2015-08-08 MED ORDER — FLUTICASONE FUROATE-VILANTEROL 100-25 MCG/INH IN AEPB
1.0000 | INHALATION_SPRAY | Freq: Every day | RESPIRATORY_TRACT | Status: DC
Start: 2015-08-08 — End: 2015-08-12
  Administered 2015-08-09 – 2015-08-12 (×4): 1 via RESPIRATORY_TRACT
  Filled 2015-08-08: qty 28

## 2015-08-08 SURGICAL SUPPLY — 72 items
APPLIER CLIP ROT 10 11.4 M/L (STAPLE)
BLADE SURG 10 STRL SS SAFETY (BLADE) ×2 IMPLANT
BLADE SURG 11 STRL SS SAFETY (MISCELLANEOUS) ×2 IMPLANT
CANISTER SUCT 1200ML W/VALVE (MISCELLANEOUS) ×2 IMPLANT
CANNULA DILATOR 10 W/SLV (CANNULA) ×2 IMPLANT
CATH TRAY 16F METER LATEX (MISCELLANEOUS) ×2 IMPLANT
CHLORAPREP W/TINT 26ML (MISCELLANEOUS) ×2 IMPLANT
CLIP APPLIE ROT 10 11.4 M/L (STAPLE) IMPLANT
COVER CLAMP SIL LG PBX B (MISCELLANEOUS) IMPLANT
DEVICE HAND ACCESS DEXTUS (MISCELLANEOUS) IMPLANT
DRAPE LAP W/FLUID (DRAPES) IMPLANT
DRAPE UNDER BUTTOCK W/FLU (DRAPES) ×2 IMPLANT
DRSG OPSITE POSTOP 4X10 (GAUZE/BANDAGES/DRESSINGS) IMPLANT
DRSG OPSITE POSTOP 4X8 (GAUZE/BANDAGES/DRESSINGS) ×2 IMPLANT
DRSG TEGADERM 2-3/8X2-3/4 SM (GAUZE/BANDAGES/DRESSINGS) ×4 IMPLANT
DRSG TEGADERM 4X4.75 (GAUZE/BANDAGES/DRESSINGS) IMPLANT
DRSG TELFA 3X8 NADH (GAUZE/BANDAGES/DRESSINGS) ×2 IMPLANT
ELECT BLADE 6.5 EXT (BLADE) ×2 IMPLANT
ELECT REM PT RETURN 9FT ADLT (ELECTROSURGICAL) ×2
ELECTRODE REM PT RTRN 9FT ADLT (ELECTROSURGICAL) ×1 IMPLANT
FILTER LAP SMOKE EVAC STRL (MISCELLANEOUS) ×2 IMPLANT
GLOVE BIO SURGEON STRL SZ7 (GLOVE) ×6 IMPLANT
GLOVE BIO SURGEON STRL SZ7.5 (GLOVE) ×6 IMPLANT
GLOVE INDICATOR 8.0 STRL GRN (GLOVE) ×4 IMPLANT
GOWN STRL REUS W/ TWL LRG LVL3 (GOWN DISPOSABLE) ×6 IMPLANT
GOWN STRL REUS W/TWL LRG LVL3 (GOWN DISPOSABLE) ×6
HANDLE YANKAUER SUCT BULB TIP (MISCELLANEOUS) ×2 IMPLANT
IRRIGATION STRYKERFLOW (MISCELLANEOUS) ×1 IMPLANT
IRRIGATOR STRYKERFLOW (MISCELLANEOUS) ×2
IV LACTATED RINGERS 1000ML (IV SOLUTION) ×2 IMPLANT
KIT RM TURNOVER STRD PROC AR (KITS) ×2 IMPLANT
LABEL OR SOLS (LABEL) ×2 IMPLANT
NDL INSUFF ACCESS 14 VERSASTEP (NEEDLE) ×2 IMPLANT
NS IRRIG 500ML POUR BTL (IV SOLUTION) ×2 IMPLANT
PACK COLON CLEAN CLOSURE (MISCELLANEOUS) ×2 IMPLANT
PACK LAP CHOLECYSTECTOMY (MISCELLANEOUS) ×2 IMPLANT
PAD PREP 24X41 OB/GYN DISP (PERSONAL CARE ITEMS) ×2 IMPLANT
PENCIL ELECTRO HAND CTR (MISCELLANEOUS) ×2 IMPLANT
PROT DEXTUS HAND ACCESS (MISCELLANEOUS)
RELOAD PROXIMATE 75MM BLUE (ENDOMECHANICALS) ×2 IMPLANT
RETAINER VISCERA MED (MISCELLANEOUS) IMPLANT
RETRACTOR FIXED LENGTH SML (MISCELLANEOUS) IMPLANT
RETRACTOR WND ALEXIS-O 25 LRG (MISCELLANEOUS) IMPLANT
RETRACTOR WOUND ALXS 18CM MED (MISCELLANEOUS) ×1 IMPLANT
RTRCTR WOUND ALEXIS O 18CM MED (MISCELLANEOUS) ×2
RTRCTR WOUND ALEXIS O 25CM LRG (MISCELLANEOUS)
SCISSORS METZENBAUM CVD 33 (INSTRUMENTS) IMPLANT
SEAL FOR SCOPE WARMER C3101 (MISCELLANEOUS) IMPLANT
SET YANKAUER POOLE SUCT (MISCELLANEOUS) IMPLANT
SHEARS HARMONIC ACE PLUS 36CM (ENDOMECHANICALS) ×2 IMPLANT
SPONGE LAP 18X18 5 PK (GAUZE/BANDAGES/DRESSINGS) ×2 IMPLANT
SPONGE XRAY 4X4 16PLY STRL (MISCELLANEOUS) ×2 IMPLANT
STAPLER PROXIMATE 75MM BLUE (STAPLE) ×2 IMPLANT
STRIP CLOSURE SKIN 1/2X4 (GAUZE/BANDAGES/DRESSINGS) ×2 IMPLANT
SUT PROLENE 0 CT 1 30 (SUTURE) ×10 IMPLANT
SUT SILK 2 0 (SUTURE)
SUT SILK 2-0 30XBRD TIE 12 (SUTURE) IMPLANT
SUT SILK 3-0 (SUTURE) ×4 IMPLANT
SUT VIC AB 2-0 BRD 54 (SUTURE) ×4 IMPLANT
SUT VIC AB 2-0 CT1 27 (SUTURE) ×2
SUT VIC AB 2-0 CT1 TAPERPNT 27 (SUTURE) ×2 IMPLANT
SUT VIC AB 3-0 54X BRD REEL (SUTURE) ×2 IMPLANT
SUT VIC AB 3-0 BRD 54 (SUTURE) ×2
SUT VIC AB 3-0 SH 27 (SUTURE) ×2
SUT VIC AB 3-0 SH 27X BRD (SUTURE) ×2 IMPLANT
SUT VIC AB 4-0 FS2 27 (SUTURE) ×8 IMPLANT
TOWEL OR 17X26 4PK STRL BLUE (TOWEL DISPOSABLE) ×2 IMPLANT
TROCAR XCEL NON-BLD 11X100MML (ENDOMECHANICALS) ×2 IMPLANT
TROCAR XCEL NON-BLD 5MMX100MML (ENDOMECHANICALS) ×2 IMPLANT
TROCAR XCEL UNIV SLVE 11M 100M (ENDOMECHANICALS) ×2 IMPLANT
TUBING INSUFFLATOR HEATED (MISCELLANEOUS) ×2 IMPLANT
WATER STERILE IRR 1000ML POUR (IV SOLUTION) ×2 IMPLANT

## 2015-08-08 NOTE — Consult Note (Signed)
[facility] [place] NAME: Omar [name]    MR#:  [id number]  DATE OF BIRTH:  [birth year]  DATE OF ADMISSION:  08/08/2015  PRIMARY CARE PHYSICIAN: Volanda Napoleon, MD   CONSULT REQUESTING/REFERRING PHYSICIAN: Dr. Bary Castilla  REASON FOR CONSULT: Hypertension, atrial flutter  CHIEF COMPLAINT:  No chief complaint on file.  Hyportension  HISTORY OF PRESENT ILLNESS:  Omar [name]  is a 75 y.o. male with a known history of atrial flutter, CAD, hypertension being admitted to the hospital after having right hemicolectomy. Patient initially had abdominal pain for which he had a CT scan of the abdomen and ultrasound of the abdomen done. Followed by a colonoscopy which showed 3 polyps. Pathology from his polyp in the ascending colon showed tubular adenoma with high-grade dysplasia. Today patient presented to the hospital for right hemicolectomy regarding this. In the recovery area patient is found to be hypotensive into systolic of [id number]. Heart rate has fluctuated between sinus bradycardia to atrial flutter. He has also been noted to have second degree type I block. Other than some mild abdominal pain patient feels well. No recent change in medications.  PAST MEDICAL HISTORY:   Past Medical History  Diagnosis Date  . Anxiety   . GERD (gastroesophageal reflux disease)   . Hypertension   . Heart murmur   . Hypercholesterolemia   . Sleep apnea   . Prostate cancer ([place]) 01/01/13, 01/30/14    Gleason 3+4=7, volume 46.6 cc  . Arthritis     rheumatoid  . Atrial fibrillation ([facility])     hx of  . Rheumatoid arthritis ([place])   . S/P radiation therapy  04/03/2014 through 06/04/2014                                                       Prostate 7800 cGy in 40 sessions                          . Diabetes mellitus without complication ([place])   . Colon polyp 07-07-15    TUBULAR ADENOMA WITH AT LEAST HIGH-GRADE / Dr Rayann Heman    PAST SURGICAL HISTOIRY:    Past Surgical History  Procedure Laterality Date  . Tonsillectomy    . Prostate biopsy  01/01/13, 01/30/14    Gleason 3+3=6, vol 46.6 cc  . Abdominal aortic aneurysm repair  11/2013  . Nasal sinus surgery    . Uvula surgery      for sleep apnea  . Colonoscopy with propofol N/A 07/07/2015    Procedure: COLONOSCOPY WITH PROPOFOL;  Surgeon: Josefine Class, MD;  Location: [facility] ENDOSCOPY;  Service: Endoscopy;  Laterality: N/A;    SOCIAL HISTORY:   Social History  Substance Use Topics  . Smoking status: Current Every Day Smoker -- 2.00 packs/day for 50 years    Types: Cigarettes  . Smokeless tobacco: Never Used  . Alcohol Use: 0.6 oz/week    1 Cans of beer per week     Comment: occasionally    FAMILY HISTORY:   Family History  Problem Relation Age of Onset  . Heart attack Mother   . Cirrhosis Father   . Cancer Brother     pancreatic  . Diabetes Brother   . Diabetes Daughter  medication induced for cancer treatments  . Cancer Daughter     breast, brain    DRUG ALLERGIES:  No Known Allergies  REVIEW OF SYSTEMS:   ROS  CONSTITUTIONAL: No fever, fatigue or weakness.  EYES: No blurred or double vision.  EARS, NOSE, AND THROAT: No tinnitus or ear pain.  RESPIRATORY: No cough, shortness of breath, wheezing or hemoptysis.  CARDIOVASCULAR: No chest pain, orthopnea, edema.  GASTROINTESTINAL: No nausea, vomiting, diarrhea . Positive for abdominal pain GENITOURINARY: No dysuria, hematuria.  ENDOCRINE: No polyuria, nocturia,  HEMATOLOGY: No anemia, easy bruising or bleeding SKIN: No rash or lesion. MUSCULOSKELETAL: No joint pain or arthritis.   NEUROLOGIC: No tingling, numbness, weakness.  PSYCHIATRY: No anxiety or depression.   MEDICATIONS AT HOME:   Prior to Admission medications   Medication Sig Start Date End Date Taking? Authorizing Provider  amLODipine-benazepril (LOTREL) 5-20 MG per capsule Take 1 capsule by mouth daily.   Yes Historical Provider, MD   aspirin 81 MG tablet Take 81 mg by mouth daily.   Yes Historical Provider, MD  atorvastatin (LIPITOR) 80 MG tablet Take 80 mg by mouth daily.   Yes Historical Provider, MD  busPIRone (BUSPAR) 5 MG tablet Take 5 mg by mouth 2 (two) times daily.    Yes Historical Provider, MD  Butalbital-APAP-Caffeine 50-300-40 MG CAPS Take 1 capsule by mouth as needed.  05/13/15  Yes Historical Provider, MD  celecoxib (CELEBREX) 200 MG capsule Take 200 mg by mouth daily.    Yes Historical Provider, MD  cyclobenzaprine (FLEXERIL) 5 MG tablet Take 5 mg by mouth 3 (three) times daily as needed for muscle spasms.   Yes Historical Provider, MD  fexofenadine (ALLEGRA) 180 MG tablet Take 180 mg by mouth daily.   Yes Historical Provider, MD  folic acid (FOLVITE) 1 MG tablet Take 1 mg by mouth daily.   Yes Historical Provider, MD  isosorbide mononitrate (IMDUR) 30 MG 24 hr tablet Take 30 mg by mouth daily.   Yes Historical Provider, MD  neomycin (MYCIFRADIN) 500 MG tablet Take two (2) tablets at 6 pm and two (2) tablets at 11 pm the evening prior to surgery. 08/07/15  Yes Robert Bellow, MD  pantoprazole (PROTONIX) 20 MG tablet Take 40 mg by mouth daily.    Yes Historical Provider, MD  polyethylene glycol powder (GLYCOLAX/MIRALAX) powder 255 grams one bottle for colonoscopy prep 07/10/15  Yes Robert Bellow, MD  polyethylene glycol-electrolytes (NULYTELY/GOLYTELY) 420 g solution Take 4,000 mLs by mouth once.   Yes Historical Provider, MD  clopidogrel (PLAVIX) 75 MG tablet Take 75 mg by mouth daily.    Historical Provider, MD  fluticasone (FLONASE) 50 MCG/ACT nasal spray Place 2 sprays into both nostrils daily.     Historical Provider, MD  Fluticasone Furoate-Vilanterol (BREO ELLIPTA) 100-25 MCG/INH AEPB Inhale 1 puff into the lungs daily.     Historical Provider, MD  metFORMIN (GLUCOPHAGE) 500 MG tablet Take 500 mg by mouth 2 (two) times daily with a meal.    Historical Provider, MD  tamsulosin (FLOMAX) 0.4 MG CAPS capsule  Take 1 capsule (0.4 mg total) by mouth daily. Patient not taking: Reported on 07/31/2015 06/03/14   Arloa Koh, MD      VITAL SIGNS:  Blood pressure 93/49, pulse 132, temperature 99.4 F (37.4 C), temperature source Oral, resp. rate 15, height 5\' 11"  (1.803 m), weight 102.059 kg (225 lb), SpO2 98 %.  PHYSICAL EXAMINATION:  GENERAL:  75 y.o.-year-old patient lying in the bed with  no acute distress.  EYES: Pupils equal, round, reactive to light and accommodation. No scleral icterus. Extraocular muscles intact.  HEENT: Head atraumatic, normocephalic. Oropharynx and nasopharynx clear.  NECK:  Supple, no jugular venous distention. No thyroid enlargement, no tenderness.  LUNGS: Normal breath sounds bilaterally, no wheezing, rales,rhonchi or crepitation. No use of accessory muscles of respiration.  CARDIOVASCULAR: S1, S2 normal. No murmurs, rubs, or gallops.  ABDOMEN: Soft. Dressing over surgical scar. Bowel sounds absent. EXTREMITIES: No pedal edema, cyanosis, or clubbing.  NEUROLOGIC: Cranial nerves II through XII are intact. Muscle strength 5/5 in all extremities. Sensation intact. Gait not checked.  PSYCHIATRIC: The patient is alert and oriented x 3.  SKIN: No obvious rash, lesion, or ulcer.   LABORATORY PANEL:   CBC No results for input(s): WBC, HGB, HCT, PLT in the last 168 hours. ------------------------------------------------------------------------------------------------------------------  Chemistries  No results for input(s): NA, K, CL, CO2, GLUCOSE, BUN, CREATININE, CALCIUM, MG, AST, ALT, ALKPHOS, BILITOT in the last 168 hours.  Invalid input(s): GFRCGP ------------------------------------------------------------------------------------------------------------------  Cardiac Enzymes No results for input(s): TROPONINI in the last 168 hours. ------------------------------------------------------------------------------------------------------------------  RADIOLOGY:  No  results found.  EKG:   Orders placed or performed in visit on 10/31/13  . EKG 12-Lead    IMPRESSION AND PLAN:   * Right hemicolectomy for tubular adenoma with high-grade dysplasia. Patient has been hypotensive in the recovery area. Received ephedrine and 500 mL fluid bolus. Blood pressure improving. Discussed with the anesthesiologist Dr. Benjamine Mola and Dr. Bary Castilla. Patient will be transferred to medical floor with telemetry monitoring once stabilized.  * Atrial flutter He is not on any rate control medications at home. Presently heart rate is in 50s to 60s. Cardiac monitoring ordered. On aspirin  * Hypertension Hold blood pressure medications  * Diabetes mellitus type 2, well controlled Hold metformin. Add sliding scale insulin.  * Sleep apnea Continue CPAP machine at night.  * DVT prophylaxis. SCDs   All the records are reviewed and case discussed with Consulting provider. Management plans discussed with the patient, family and they are in agreement.  TOTAL TIME TAKING CARE OF THIS PATIENT: 40 minutes.    Hillary Bow R M.D on 08/08/2015 at 11:15 AM  Between 7am to 6pm - Pager - [phone]  After 6pm go to [email or web address] - password [email or web address] [facility]  Office  [phone]  CC: Primary care Physician: Volanda Napoleon, MD  Note: This dictation was prepared with Dragon dictation along with smaller phrase technology. Any transcriptional errors that result from this process are unintentional.

## 2015-08-08 NOTE — Transfer of Care (Signed)
Immediate Anesthesia Transfer of Care Note  Patient: Omar [name]  Procedure(s) Performed: Procedure(s): LAPAROSCOPIC RIGHT COLECTOMY (Right)  Patient Location: PACU  Anesthesia Type:General  Level of Consciousness: awake, alert  and oriented  Airway & Oxygen Therapy: Patient Spontanous Breathing and Patient connected to face mask oxygen  Post-op Assessment: Report given to RN and Post -op Vital signs reviewed and stable  Post vital signs: Reviewed and stable  Last Vitals: 0955 - 100% sat 55 hr 98/55 bp 20 resp 99.6 temp 161 bs Filed Vitals:   08/08/15 0630  BP: 116/65  Pulse: 34  Temp: 36.8 C  Resp: 16    Complications: No apparent anesthesia complications

## 2015-08-08 NOTE — Progress Notes (Signed)
Patient able to perform IS maneuver without difficulty.  BBS essentially clear. Nonproductive cough noted.

## 2015-08-08 NOTE — Progress Notes (Signed)
Bolus done and ephedrine total 25mg  given   Blood pressure 96/41  Heart rate in 40's at times  Dr rice aware

## 2015-08-08 NOTE — Progress Notes (Signed)
Blood pressure low upper 80's  Heart pattern shows second degree type 1 block   Dr rice in to see pt    See orders for ephedrine and bolus

## 2015-08-08 NOTE — Progress Notes (Signed)
Dr sudini to see pt   Dr rice also in to see pt

## 2015-08-08 NOTE — Progress Notes (Signed)
BP and pulse similar to in PACU. Patient awake and alert. Minimal pain. Will change Tylenol form po to IV for 24 hours as there may be a concern w/ narcotics dropping his blood pressure.

## 2015-08-08 NOTE — Progress Notes (Signed)
Received patient from PACU, and patient BP on admit 96/37 heart rate 51, 96% on 2 liters. Dr. Darvin Neighbours called and notified of patient status and 500 ml bolus of NS given as ordered. Rapid response called 12 lead EKG done. Dr. Darvin Neighbours notified of the EKG and received order to transfer patient to Step Down unit. Patient daughter at the bedside and aware that patient will be tranfered to the unit.

## 2015-08-08 NOTE — Anesthesia Procedure Notes (Signed)
Procedure Name: Intubation Date/Time: 08/08/2015 7:33 AM Performed by: Delaney Meigs Pre-anesthesia Checklist: Patient identified, Emergency Drugs available, Suction available, Patient being monitored and Timeout performed Patient Re-evaluated:Patient Re-evaluated prior to inductionOxygen Delivery Method: Circle system utilized Preoxygenation: Pre-oxygenation with 100% oxygen Intubation Type: IV induction Ventilation: Mask ventilation without difficulty Laryngoscope Size: Mac and 3 Grade View: Grade I Tube type: Oral Tube size: 7.5 mm Number of attempts: 1 Airway Equipment and Method: Stylet Placement Confirmation: ETT inserted through vocal cords under direct vision,  positive ETCO2 and breath sounds checked- equal and bilateral Secured at: 22 cm Tube secured with: Tape Dental Injury: Teeth and Oropharynx as per pre-operative assessment

## 2015-08-08 NOTE — Progress Notes (Signed)
Pressure remains in the 90's, pulse in the 50's. Minimal pain. Tolerating liquids. No SOB/ CP. Looks good. Will d/c PCA and use PRN MS IV for breakthrough pain.

## 2015-08-08 NOTE — Progress Notes (Signed)
Throughout pacu stay pt in atrial fib  Second degree type 1    flutter

## 2015-08-08 NOTE — Progress Notes (Signed)
Called out front  To update family

## 2015-08-08 NOTE — Progress Notes (Signed)
Called dr rice  Blood pressure 93/53  Dr rice good with this to send pt to floor

## 2015-08-08 NOTE — Progress Notes (Signed)
Pt arrived Unannounced from PACU.  Appears lethargic, Post Op.  Pts BP soft, bedside report received.

## 2015-08-08 NOTE — H&P (Signed)
Tolerated prep well. No new complaints. Lungs: Clear. Cardio: Slow, RR. ABD: Soft. Discussed plans for lap colon, possible temporary closure of hernia defects with delayed repair in future.

## 2015-08-08 NOTE — Anesthesia Preprocedure Evaluation (Signed)
Anesthesia Evaluation  Patient identified by MRN, date of birth, ID band Patient awake    Reviewed: Allergy & Precautions, NPO status , Patient's Chart, lab work & pertinent test results  Airway Mallampati: III  TM Distance: >3 FB Neck ROM: Limited    Dental  (+) Partial Upper   Pulmonary sleep apnea and Continuous Positive Airway Pressure Ventilation , Current Smoker,    Pulmonary exam normal        Cardiovascular Exercise Tolerance: Poor hypertension, Pt. on medications  Rhythm:Irregular Rate:Bradycardia     Neuro/Psych Anxiety    GI/Hepatic GERD  Medicated and Controlled,  Endo/Other  diabetes, Type 2BG 125.  Renal/GU      Musculoskeletal  (+) Arthritis , Osteoarthritis,    Abdominal (+) + obese,  Abdomen: soft.    Peds  Hematology   Anesthesia Other Findings   Reproductive/Obstetrics                             Anesthesia Physical Anesthesia Plan  ASA: III  Anesthesia Plan: General   Post-op Pain Management:    Induction: Intravenous  Airway Management Planned: Oral ETT  Additional Equipment:   Intra-op Plan:   Post-operative Plan: Extubation in OR  Informed Consent: I have reviewed the patients History and Physical, chart, labs and discussed the procedure including the risks, benefits and alternatives for the proposed anesthesia with the patient or authorized representative who has indicated his/her understanding and acceptance.     Plan Discussed with: CRNA and Surgeon  Anesthesia Plan Comments:         Anesthesia Quick Evaluation

## 2015-08-08 NOTE — Op Note (Signed)
Preoperative diagnosis: Right colon polyp  Postoperative diagnosis: Same.  Operative procedure: Laparoscopically assisted right hemicolectomy.  Operating surgeon: Hervey Ard, M.D.  Assistant surgeon: Ellwood Sayers, M.D.  Anesthesia: Gen. endotracheal.  Estimated blood loss: Less than 50 mL.  Clinical note: This 75 year old male underwent a colonoscopy and was found to have a 3 cm polyp in the ascending colon opposite the ileocecal valve. Biopsy showed severe dysplasia. He was felt to be a candidate for right colectomy.  The patient underwent formal bowel prep including oral antibiotics prior to procedure. He received Invanz prior to the induction of anesthesia.  Operative note: After induction of general endotracheal anesthesia with a warming blanket and place a Foley catheter was placed by the nurse. The abdomen was prepped with ChloraPrep and draped. In Trendelenburg position a varies needle was placed through a trans-umbilical incision. After assuring intra-abdominal location with the hanging drop test the abdomen was insufflated with CO2 at 10 mmHg pressure. A 10 mm Step port was expanded and inspection showed no evidence of injury from initial port placement. A 0 scope was used to explore the abdomen and no evidence of metastatic disease or other pathologic process was noted. A 30 scope was placed and an 11 mm XL port placed in the left upper quadrant and a similar port placed in the hypogastrium. The right colon was mobilized by dividing the white line of Toldt with the Harmonic scalpel. A long appendix was mobilized with the cecum. The small bowel and right colon were rolled medially. The hepatic flexure was exposed and the adhesions between the flexure and the liver taken down with the Harmonic scalpel. The omentum was moderately thickened and this was separated from the colon with the Harmonic scalpel. The duodenum was identified and protected. A large renal cyst pushing the duodenum  forward was noted as expected based on preoperative CT. This was not dilated. After was possible to bring the entire right colon to the midline the abdomen was desufflated and ports were removed.  A 7 cm incision was made straddling the umbilicus and a wound protector placed. The right colon was brought into the wound. The proximal line of resection was approximately 4 inches from the ileocecal valve. Mesentery was divided with the Harmonic scalpel except for the right colic vessels which were controlled with 2-0 silk ties. The transverse mesentery was treated in similar fashion. The transverse colon and the small bowel were anchored together side to side making use of 3-0 silk sutures. A side-to-side functional end-to-end anastomosis was completed with 2 firings of the GIA-75 known meter stapler. The anastomosis palpated to 3 fingerbreadths and good hemostasis was noted. The ends of the anastomosis were inverted with 3-0 silk sutures and the midportion of the anastomosis reinforced in a similar fashion. The mesentery was then closed with a running 3-0 Vicryl suture. The abdomen was irrigated and good hemostasis noted.  Gowns gloves and instruments were changed per protocol. The peritoneum was closed with a running 0 Vicryl suture. The fascial was closed with interrupted 0 Prolene figure-of-eight sutures. The adipose layer was closed with a running 3-0 Vicryl suture and the skin closed with a running 4-0 Vicryl subcuticular suture. The 3 other port sites were closed with 4-0 Vicryl subcutaneous sutures. A honeycomb dressing was placed over the colon extraction site and Telfa and Tegaderm placed over the remaining port sites.  The patient tolerated the procedure well and was brought to the recovery room in stable condition.

## 2015-08-08 NOTE — Anesthesia Postprocedure Evaluation (Signed)
Anesthesia Post Note  Patient: Omar [name]  Procedure(s) Performed: Procedure(s) (LRB): LAPAROSCOPIC RIGHT COLECTOMY (Right)  Patient location during evaluation: PACU Anesthesia Type: General Level of consciousness: awake and awake and alert Pain management: pain level controlled Vital Signs Assessment: post-procedure vital signs reviewed and stable Respiratory status: spontaneous breathing Cardiovascular status: blood pressure returned to baseline Postop Assessment: no headache Anesthetic complications: no Comments: Initially low SBP once pain controlled--88-96/50. He has been seen by the hospitalist and will go to a telemetry bed to monitor his afib and heart block. He will have the dose of his anti-hypertensives reduced. He received a 1liter bolus of fluid and ephedrine while in PACU.Marland Kitchen He was alert and conversant throughout.    Last Vitals:  Filed Vitals:   08/08/15 1123 08/08/15 1152  BP: 93/53 96/37  Pulse: 65 29  Temp: 36.4 C 36.7 C  Resp: 22 20    Last Pain:  Filed Vitals:   08/08/15 1154  PainSc: 2                  Avelardo Reesman M

## 2015-08-09 LAB — BASIC METABOLIC PANEL
Anion gap: 8 (ref 5–15)
BUN: 17 mg/dL (ref 6–20)
CALCIUM: 7.8 mg/dL — AB (ref 8.9–10.3)
CHLORIDE: 110 mmol/L (ref 101–111)
CO2: 17 mmol/L — ABNORMAL LOW (ref 22–32)
CREATININE: 1.46 mg/dL — AB (ref 0.61–1.24)
GFR calc non Af Amer: 46 mL/min — ABNORMAL LOW (ref >60)
GFR, EST AFRICAN AMERICAN: 53 mL/min — AB (ref >60)
Glucose, Bld: 107 mg/dL — ABNORMAL HIGH (ref 65–99)
Potassium: 4.1 mmol/L (ref 3.5–5.1)
SODIUM: 135 mmol/L (ref 135–145)

## 2015-08-09 LAB — CBC WITH DIFFERENTIAL/PLATELET
BASOS PCT: 0 %
Basophils Absolute: 0.1 10*3/uL (ref 0–0.1)
EOS ABS: 0 10*3/uL (ref 0–0.7)
EOS PCT: 0 %
HCT: 35.3 % — ABNORMAL LOW (ref 40.0–52.0)
HEMOGLOBIN: 11.3 g/dL — AB (ref 13.0–18.0)
Lymphocytes Relative: 6 %
Lymphs Abs: 0.7 10*3/uL — ABNORMAL LOW (ref 1.0–3.6)
MCH: 27.6 pg (ref 26.0–34.0)
MCHC: 32.1 g/dL (ref 32.0–36.0)
MCV: 86 fL (ref 80.0–100.0)
MONO ABS: 1 10*3/uL (ref 0.2–1.0)
MONOS PCT: 8 %
Neutro Abs: 10.7 10*3/uL — ABNORMAL HIGH (ref 1.4–6.5)
Neutrophils Relative %: 86 %
Platelets: 188 10*3/uL (ref 150–440)
RBC: 4.11 MIL/uL — ABNORMAL LOW (ref 4.40–5.90)
RDW: 19.3 % — AB (ref 11.5–14.5)
WBC: 12.5 10*3/uL — ABNORMAL HIGH (ref 3.8–10.6)

## 2015-08-09 NOTE — Progress Notes (Signed)
Afebrile. BP trending up. Still bradycardic, usually in the 50-60 range. AV block with occasional escape beats. Patient w/o symptoms. Lungs: Clear. Inspirex at 3000. ABD: Minimal distension, few BS. No flatus yet. Dressings: Dry. Extrem: Soft. U/O: 550 cc since OR. (Moderately concentrated). Hungry. Plan: Advance diet, ambulate in room, recheck labs.

## 2015-08-09 NOTE — Progress Notes (Signed)
[facility] [place] NAME: Omar [name]    MR#:  [id number]  DATE OF BIRTH:  [birth year]  SUBJECTIVE:  CHIEF COMPLAINT:  No chief complaint on file.  Mild abd pain No sob/cp/dizziness.  Has been bradycardic  REVIEW OF SYSTEMS:    Review of Systems  Constitutional: Negative for fever and chills.  HENT: Negative for sore throat.   Eyes: Negative for blurred vision, double vision and pain.  Respiratory: Negative for cough, hemoptysis, shortness of breath and wheezing.   Cardiovascular: Negative for chest pain, palpitations, orthopnea and leg swelling.  Gastrointestinal: Positive for abdominal pain. Negative for heartburn, nausea, vomiting, diarrhea and constipation.  Genitourinary: Negative for dysuria and hematuria.  Musculoskeletal: Negative for back pain and joint pain.  Skin: Negative for rash.  Neurological: Negative for sensory change, speech change, focal weakness and headaches.  Endo/Heme/Allergies: Does not bruise/bleed easily.  Psychiatric/Behavioral: Negative for depression. The patient is not nervous/anxious.     DRUG ALLERGIES:  No Known Allergies  VITALS:  Blood pressure 115/61, pulse 44, temperature 99.1 F (37.3 C), temperature source Oral, resp. rate 22, height 5\' 11"  (1.803 m), weight 102.059 kg (225 lb), SpO2 94 %.  PHYSICAL EXAMINATION:   Physical Exam  GENERAL:  75 y.o.-year-old patient lying in the bed with no acute distress.  EYES: Pupils equal, round, reactive to light and accommodation. No scleral icterus. Extraocular muscles intact.  HEENT: Head atraumatic, normocephalic. Oropharynx and nasopharynx clear.  NECK:  Supple, no jugular venous distention. No thyroid enlargement, no tenderness.  LUNGS: Normal breath sounds bilaterally, no wheezing, rales, rhonchi. No use of accessory muscles of respiration.  CARDIOVASCULAR: S1, S2 . Bradycardia ABDOMEN: Soft, nondistended. Bowel sounds present. No  organomegaly or mass. Dressing over surgical wound. Tender. EXTREMITIES: No cyanosis, clubbing or edema b/l.    NEUROLOGIC: Cranial nerves II through XII are intact. No focal Motor or sensory deficits b/l.   PSYCHIATRIC: The patient is alert and oriented x 3.  SKIN: No obvious rash, lesion, or ulcer.   LABORATORY PANEL:   CBC  Recent Labs Lab 08/09/15 0821  WBC 12.5*  HGB 11.3*  HCT 35.3*  PLT 188   ------------------------------------------------------------------------------------------------------------------ Chemistries   Recent Labs Lab 08/09/15 0821  NA 135  K 4.1  CL 110  CO2 17*  GLUCOSE 107*  BUN 17  CREATININE 1.46*  CALCIUM 7.8*   ------------------------------------------------------------------------------------------------------------------  Cardiac Enzymes No results for input(s): TROPONINI in the last 168 hours. ------------------------------------------------------------------------------------------------------------------  RADIOLOGY:  No results found.   ASSESSMENT AND PLAN:   * Right hemicolectomy for tubular adenoma with high-grade dysplasia. Recovering well.  * Atrial flutter He is not on any rate control medications at home. Presently heart rate is in 50s On Tele monitoring. Appreciate Dr. Laurelyn Sickle input On aspirin  * Hypertension Hold blood pressure medications  * Diabetes mellitus type 2, well controlled Hold metformin. Add sliding scale insulin.  * Sleep apnea Continue CPAP machine at night.  * DVT prophylaxis. SCDs  All the records are reviewed and case discussed with Care Management/Social Workerr. Management plans discussed with the patient, family and they are in agreement.  CODE STATUS: FULL  TOTAL TIME TAKING CARE OF THIS PATIENT: 30 minutes.   POSSIBLE D/C IN 2-3 DAYS, DEPENDING ON CLINICAL CONDITION.  Hillary Bow R M.D on 08/09/2015 at 10:08 AM  Between 7am to 6pm - Pager - [phone]  After 6pm go to  [email or web address] - password [email or web address] [facility]  [facility]  Office  [phone]  CC: Primary care physician; Volanda Napoleon, MD  Note: This dictation was prepared with Dragon dictation along with smaller phrase technology. Any transcriptional errors that result from this process are unintentional.

## 2015-08-09 NOTE — Progress Notes (Signed)
Omar [name] is a 75 y.o. male  [id number]  Primary Cardiologist: Neoma Laming Reason for Consultation: Bradycardia and hypotension  HPI: 75 year old white male with a past medical history of bradycardia but has been asymptomatic presented for right hemicolectomy. Surgery went well and he denies any chest pain shortness of breath palpitation or dizziness. I was asked to evaluate the patient because he was bradycardic with heart rate 50 and the blood pressure was on the low side.   Review of Systems: No chest pain orthopnea PND or leg swelling   Past Medical History  Diagnosis Date  . Anxiety   . GERD (gastroesophageal reflux disease)   . Hypertension   . Heart murmur   . Hypercholesterolemia   . Sleep apnea   . Prostate cancer ([place]) 01/01/13, 01/30/14    Gleason 3+4=7, volume 46.6 cc  . Arthritis     rheumatoid  . Atrial fibrillation ([facility])     hx of  . Rheumatoid arthritis ([place])   . S/P radiation therapy  04/03/2014 through 06/04/2014                                                       Prostate 7800 cGy in 40 sessions                          . Diabetes mellitus without complication ([place])   . Colon polyp 07-07-15    TUBULAR ADENOMA WITH AT LEAST HIGH-GRADE / Dr Rayann Heman    Medications Prior to Admission  Medication Sig Dispense Refill  . amLODipine-benazepril (LOTREL) 5-20 MG per capsule Take 1 capsule by mouth daily.    Marland Kitchen aspirin EC 81 MG tablet Take 81 mg by mouth daily.    Marland Kitchen atorvastatin (LIPITOR) 80 MG tablet Take 80 mg by mouth at bedtime.     . busPIRone (BUSPAR) 5 MG tablet Take 5 mg by mouth 2 (two) times daily.     . Butalbital-APAP-Caffeine 50-300-40 MG CAPS Take 1 capsule by mouth every 6 (six) hours as needed (for migraines).     . celecoxib (CELEBREX) 200 MG capsule Take 200 mg by mouth daily.     . clopidogrel (PLAVIX) 75 MG tablet Take 75 mg by mouth daily.    . cyclobenzaprine (FLEXERIL) 5 MG tablet Take 5 mg by mouth 3 (three) times daily as needed for  muscle spasms.    . fexofenadine (ALLEGRA) 180 MG tablet Take 180 mg by mouth daily.    . fluticasone (FLONASE) 50 MCG/ACT nasal spray Place 2 sprays into both nostrils daily as needed for rhinitis.     . Fluticasone Furoate-Vilanterol (BREO ELLIPTA) 100-25 MCG/INH AEPB Inhale 1 puff into the lungs daily.     . folic acid (FOLVITE) 1 MG tablet Take 1 mg by mouth daily.    . isosorbide mononitrate (IMDUR) 30 MG 24 hr tablet Take 30 mg by mouth daily.    . metFORMIN (GLUCOPHAGE) 500 MG tablet Take 500 mg by mouth 2 (two) times daily with a meal.    . pantoprazole (PROTONIX) 40 MG tablet Take 40 mg by mouth daily.    . tamsulosin (FLOMAX) 0.4 MG CAPS capsule Take 1 capsule (0.4 mg total) by mouth daily. 30 capsule 3  . triamcinolone cream (KENALOG) 0.1 % Apply  1 application topically 2 (two) times daily.       Marland Kitchen acetaminophen  1,000 mg Intravenous 4 times per day  . alvimopan  12 mg Oral BID  . aspirin EC  81 mg Oral Daily  . busPIRone  5 mg Oral BID  . enoxaparin (LOVENOX) injection  40 mg Subcutaneous Q24H  . fluticasone  2 spray Each Nare Daily  . fluticasone furoate-vilanterol  1 puff Inhalation Daily  . ketorolac  15 mg Intravenous 3 times per day  . pantoprazole  40 mg Oral Daily  . sodium chloride  500 mL Intravenous Once  . tamsulosin  0.4 mg Oral Daily    Infusions: . lactated ringers 75 mL/hr at 08/08/15 1937    No Known Allergies  Social History   Social History  . Marital Status: Divorced    Spouse Name: N/A  . Number of Children: N/A  . Years of Education: N/A   Occupational History  . Not on file.   Social History Main Topics  . Smoking status: Current Every Day Smoker -- 2.00 packs/day for 50 years    Types: Cigarettes  . Smokeless tobacco: Never Used  . Alcohol Use: 0.6 oz/week    1 Cans of beer per week     Comment: occasionally  . Drug Use: No  . Sexual Activity: Not on file   Other Topics Concern  . Not on file   Social History Narrative     Family History  Problem Relation Age of Onset  . Heart attack Mother   . Cirrhosis Father   . Cancer Brother     pancreatic  . Diabetes Brother   . Diabetes Daughter     medication induced for cancer treatments  . Cancer Daughter     breast, brain    PHYSICAL EXAM: Filed Vitals:   08/08/15 1800 08/08/15 1945  BP: 97/47   Pulse: 41   Temp:  99.1 F (37.3 C)  Resp: 17      Intake/Output Summary (Last 24 hours) at 08/09/15 0853 Last data filed at 08/09/15 0700  Gross per 24 hour  Intake   1900 ml  Output    675 ml  Net   1225 ml    General:  Well appearing. No respiratory difficulty HEENT: normal Neck: supple. no JVD. Carotids 2+ bilat; no bruits. No lymphadenopathy or thryomegaly appreciated. Cor: PMI nondisplaced. Regular rate & rhythm. No rubs, gallops or murmurs. Lungs: clear Abdomen: soft, nontender, nondistended. No hepatosplenomegaly. No bruits or masses. Good bowel sounds. Extremities: no cyanosis, clubbing, rash, edema Neuro: alert & oriented x 3, cranial nerves grossly intact. moves all 4 extremities w/o difficulty. Affect pleasant.  ECG: EKG in the chart shows sinus rhythm with heart rate 50 from office no new EKG  Results for orders placed or performed during the hospital encounter of 08/08/15 (from the past 24 hour(s))  Glucose, capillary     Status: Abnormal   Collection Time: 08/08/15  9:53 AM  Result Value Ref Range   Glucose-Capillary 161 (H) 65 - 99 mg/dL  CBC with Differential/Platelet     Status: Abnormal   Collection Time: 08/08/15 12:05 PM  Result Value Ref Range   WBC 16.7 (H) 3.8 - 10.6 K/uL   RBC 4.11 (L) 4.40 - 5.90 MIL/uL   Hemoglobin 11.5 (L) 13.0 - 18.0 g/dL   HCT 35.4 (L) 40.0 - 52.0 %   MCV 86.2 80.0 - 100.0 fL   MCH 27.9 26.0 - 34.0 pg  MCHC 32.4 32.0 - 36.0 g/dL   RDW 19.5 (H) 11.5 - 14.5 %   Platelets 218 150 - 440 K/uL   Neutrophils Relative % 93 %   Neutro Abs 15.6 (H) 1.4 - 6.5 K/uL   Lymphocytes Relative 3 %    Lymphs Abs 0.4 (L) 1.0 - 3.6 K/uL   Monocytes Relative 4 %   Monocytes Absolute 0.6 0.2 - 1.0 K/uL   Eosinophils Relative 0 %   Eosinophils Absolute 0.0 0 - 0.7 K/uL   Basophils Relative 0 %   Basophils Absolute 0.0 0 - 0.1 K/uL  Basic metabolic panel     Status: Abnormal   Collection Time: 08/08/15 12:05 PM  Result Value Ref Range   Sodium 141 135 - 145 mmol/L   Potassium 4.4 3.5 - 5.1 mmol/L   Chloride 115 (H) 101 - 111 mmol/L   CO2 18 (L) 22 - 32 mmol/L   Glucose, Bld 141 (H) 65 - 99 mg/dL   BUN 13 6 - 20 mg/dL   Creatinine, Ser 1.59 (H) 0.61 - 1.24 mg/dL   Calcium 7.9 (L) 8.9 - 10.3 mg/dL   GFR calc non Af Amer 41 (L) >60 mL/min   GFR calc Af Amer 48 (L) >60 mL/min   Anion gap 8 5 - 15  CBC with Differential/Platelet     Status: Abnormal   Collection Time: 08/09/15  8:21 AM  Result Value Ref Range   WBC 12.5 (H) 3.8 - 10.6 K/uL   RBC 4.11 (L) 4.40 - 5.90 MIL/uL   Hemoglobin 11.3 (L) 13.0 - 18.0 g/dL   HCT 35.3 (L) 40.0 - 52.0 %   MCV 86.0 80.0 - 100.0 fL   MCH 27.6 26.0 - 34.0 pg   MCHC 32.1 32.0 - 36.0 g/dL   RDW 19.3 (H) 11.5 - 14.5 %   Platelets 188 150 - 440 K/uL   Neutrophils Relative % 86 %   Neutro Abs 10.7 (H) 1.4 - 6.5 K/uL   Lymphocytes Relative 6 %   Lymphs Abs 0.7 (L) 1.0 - 3.6 K/uL   Monocytes Relative 8 %   Monocytes Absolute 1.0 0.2 - 1.0 K/uL   Eosinophils Relative 0 %   Eosinophils Absolute 0.0 0 - 0.7 K/uL   Basophils Relative 0 %   Basophils Absolute 0.1 0 - 0.1 K/uL   No results found.   ASSESSMENT AND PLAN: Sinus bradycardia with occasional PVCs on monitor with blood pressure systolic between 90 and [id number]. Surgery went well and patient is asymptomatic. Advise giving IV fluid as has normal left reticular systolic function to get the blood pressure to normal. Patient appears to have prerenal azotemia thus IV fluids should be increased to 125 250 cc per hour.  Jarren Para A

## 2015-08-10 MED ORDER — ISOSORBIDE MONONITRATE ER 30 MG PO TB24
30.0000 mg | ORAL_TABLET | Freq: Every day | ORAL | Status: DC
  Administered 2015-08-11 – 2015-08-12 (×2): 30 mg via ORAL
  Filled 2015-08-10 (×2): qty 1

## 2015-08-10 NOTE — Progress Notes (Signed)
SUBJECTIVE: No chest pain or shortness of breath   Filed Vitals:   08/10/15 0605 08/10/15 0700 08/10/15 0800 08/10/15 0900  BP: 119/56 136/65  128/58  Pulse: 46 40  146  Temp:   97.8 F (36.6 C)   TempSrc:   Oral   Resp: 15 22  20   Height:      Weight:      SpO2: 94% 92%  90%    Intake/Output Summary (Last 24 hours) at 08/10/15 1101 Last data filed at 08/10/15 0600  Gross per 24 hour  Intake    885 ml  Output    925 ml  Net    -40 ml    LABS: Basic Metabolic Panel:  Recent Labs  08/08/15 1205 08/09/15 0821  NA 141 135  K 4.4 4.1  CL 115* 110  CO2 18* 17*  GLUCOSE 141* 107*  BUN 13 17  CREATININE 1.59* 1.46*  CALCIUM 7.9* 7.8*   Liver Function Tests: No results for input(s): AST, ALT, ALKPHOS, BILITOT, PROT, ALBUMIN in the last 72 hours. No results for input(s): LIPASE, AMYLASE in the last 72 hours. CBC:  Recent Labs  08/08/15 1205 08/09/15 0821  WBC 16.7* 12.5*  NEUTROABS 15.6* 10.7*  HGB 11.5* 11.3*  HCT 35.4* 35.3*  MCV 86.2 86.0  PLT 218 188   Cardiac Enzymes: No results for input(s): CKTOTAL, CKMB, CKMBINDEX, TROPONINI in the last 72 hours. BNP: Invalid input(s): POCBNP D-Dimer: No results for input(s): DDIMER in the last 72 hours. Hemoglobin A1C: No results for input(s): HGBA1C in the last 72 hours. Fasting Lipid Panel: No results for input(s): CHOL, HDL, LDLCALC, TRIG, CHOLHDL, LDLDIRECT in the last 72 hours. Thyroid Function Tests: No results for input(s): TSH, T4TOTAL, T3FREE, THYROIDAB in the last 72 hours.  Invalid input(s): FREET3 Anemia Panel: No results for input(s): VITAMINB12, FOLATE, FERRITIN, TIBC, IRON, RETICCTPCT in the last 72 hours.   PHYSICAL EXAM General: Well developed, well nourished, in no acute distress HEENT:  Normocephalic and atramatic Neck:  No JVD.  Lungs: Clear bilaterally to auscultation and percussion. Heart: HRRR . Normal S1 and S2 without gallops or murmurs.  Abdomen: Bowel sounds are positive,  abdomen soft and non-tender  Msk:  Back normal, normal gait. Normal strength and tone for age. Extremities: No clubbing, cyanosis or edema.   Neuro: Alert and oriented X 3. Psych:  Good affect, responds appropriately  TELEMETRY: Sinus bradycardia with type I second-degree AV block. Patient is asymptomatic and may go to telemetry. No indication for pacemaker at this time.  ASSESSMENT AND PLAN: Asymptomatic second-degree AV block with sinus bradycardia. No indication for pacemaker at this time with blood pressure improving and have gone through surgery without any problem.  Active Problems:   Colon polyp    Dionisio David, MD, [facility] 08/10/2015 11:01 AM

## 2015-08-10 NOTE — Progress Notes (Signed)
Patient ID: Omar [name], male   DOB: 1941/05/03, 75 y.o.   MRN: [id number] AVSS. Still bradycardic. No complaints. Had 2 bms this am. Tolerating po well. Abdomen is soft, good bowel sounds Lungs clear Has not walked much yet. Did void a little after foley removed this am. Doing very well. Cardiology to decide about transfer to floor care

## 2015-08-10 NOTE — Progress Notes (Signed)
Report to [employer] on 2A.  Denies questions.  Pt awaiting transport to 2A

## 2015-08-10 NOTE — Progress Notes (Signed)
[place] at [place] NAME: Omar [name]    MR#:  [id number]  DATE OF BIRTH:  1941/04/09  SUBJECTIVE:  CHIEF COMPLAINT:  No chief complaint on file.  Mild abd pain. No sob/cp/dizziness. Sitting up in a chair  Has been bradycardic. Which is baseline  REVIEW OF SYSTEMS:    Review of Systems  Constitutional: Negative for fever and chills.  HENT: Negative for sore throat.   Eyes: Negative for blurred vision, double vision and pain.  Respiratory: Negative for cough, hemoptysis, shortness of breath and wheezing.   Cardiovascular: Negative for chest pain, palpitations, orthopnea and leg swelling.  Gastrointestinal: Positive for abdominal pain. Negative for heartburn, nausea, vomiting, diarrhea and constipation.  Genitourinary: Negative for dysuria and hematuria.  Musculoskeletal: Negative for back pain and joint pain.  Skin: Negative for rash.  Neurological: Negative for sensory change, speech change, focal weakness and headaches.  Endo/Heme/Allergies: Does not bruise/bleed easily.  Psychiatric/Behavioral: Negative for depression. The patient is not nervous/anxious.     DRUG ALLERGIES:  No Known Allergies  VITALS:  Blood pressure 136/65, pulse 40, temperature 97.8 F (36.6 C), temperature source Oral, resp. rate 22, height 5\' 11"  (1.803 m), weight 102.059 kg (225 lb), SpO2 92 %.  PHYSICAL EXAMINATION:   Physical Exam  GENERAL:  75 y.o.-year-old patient lying in the bed with no acute distress.  EYES: Pupils equal, round, reactive to light and accommodation. No scleral icterus. Extraocular muscles intact.  HEENT: Head atraumatic, normocephalic. Oropharynx and nasopharynx clear.  NECK:  Supple, no jugular venous distention. No thyroid enlargement, no tenderness.  LUNGS: Normal breath sounds bilaterally, no wheezing, rales, rhonchi. No use of accessory muscles of respiration.  CARDIOVASCULAR: S1, S2 . Bradycardia ABDOMEN: Soft,  nondistended. Bowel sounds present. No organomegaly or mass. Dressing over surgical wound. Tender. EXTREMITIES: No cyanosis, clubbing or edema b/l.    NEUROLOGIC: Cranial nerves II through XII are intact. No focal Motor or sensory deficits b/l.   PSYCHIATRIC: The patient is alert and oriented x 3.  SKIN: No obvious rash, lesion, or ulcer.   LABORATORY PANEL:   CBC  Recent Labs Lab 08/09/15 0821  WBC 12.5*  HGB 11.3*  HCT 35.3*  PLT 188   ------------------------------------------------------------------------------------------------------------------ Chemistries   Recent Labs Lab 08/09/15 0821  NA 135  K 4.1  CL 110  CO2 17*  GLUCOSE 107*  BUN 17  CREATININE 1.46*  CALCIUM 7.8*   ------------------------------------------------------------------------------------------------------------------  Cardiac Enzymes No results for input(s): TROPONINI in the last 168 hours. ------------------------------------------------------------------------------------------------------------------  RADIOLOGY:  No results found.   ASSESSMENT AND PLAN:   * Right hemicolectomy for tubular adenoma with high-grade dysplasia. Recovering well. Tolerating diet  * Atrial flutter He is not on any rate control medications at home. Presently heart rate is in 50s On Tele monitoring. Appreciate Dr. Laurelyn Sickle input On aspirin  * Hypertension Hold blood pressure medications Will resume his imdur today  * Diabetes mellitus type 2, well controlled Held metformin. Added sliding scale insulin.  * Sleep apnea Continue CPAP at night.  * DVT prophylaxis. SCDs  All the records are reviewed and case discussed with Care Management/Social Workerr. Management plans discussed with the patient, family and they are in agreement.  CODE STATUS: FULL  TOTAL TIME TAKING CARE OF THIS PATIENT: 30 minutes.   POSSIBLE D/C IN 2-3 DAYS, DEPENDING ON CLINICAL CONDITION.  Hillary Bow R M.D on 08/10/2015  at 10:10 AM  Between 7am to 6pm - Pager -  [phone]  After 6pm go to [email or web address] - password [email or web address] [facility]  Office  [phone]  CC: Primary care physician; Volanda Napoleon, MD  Note: This dictation was prepared with Dragon dictation along with smaller phrase technology. Any transcriptional errors that result from this process are unintentional.

## 2015-08-11 LAB — SURGICAL PATHOLOGY

## 2015-08-11 MED ORDER — ACETAMINOPHEN 325 MG PO TABS: 650.0000 mg | ORAL_TABLET | Freq: Four times a day (QID) | ORAL | Status: DC | PRN

## 2015-08-11 MED ORDER — HYDROCODONE-ACETAMINOPHEN 5-325 MG PO TABS
1.0000 | ORAL_TABLET | ORAL | Status: DC | PRN
  Administered 2015-08-11: 1 via ORAL
  Filled 2015-08-11: qty 1

## 2015-08-11 NOTE — Progress Notes (Signed)
SUBJECTIVE: Patient is feeling much better denies any chest pain or shortness of breath or dizziness   Filed Vitals:   08/10/15 0900 08/10/15 1121 08/10/15 1927 08/11/15 0454  BP: 128/58 127/65 144/62 128/64  Pulse: 146 41 50 79  Temp:  97.6 F (36.4 C) 98.3 F (36.8 C) 98.2 F (36.8 C)  TempSrc:  Oral Oral Oral  Resp: 20 21 18 18   Height:      Weight:      SpO2: 90% 97% 93% 93%    Intake/Output Summary (Last 24 hours) at 08/11/15 0859 Last data filed at 08/11/15 0455  Gross per 24 hour  Intake 1046.25 ml  Output    250 ml  Net 796.25 ml    LABS: Basic Metabolic Panel:  Recent Labs  08/08/15 1205 08/09/15 0821  NA 141 135  K 4.4 4.1  CL 115* 110  CO2 18* 17*  GLUCOSE 141* 107*  BUN 13 17  CREATININE 1.59* 1.46*  CALCIUM 7.9* 7.8*   Liver Function Tests: No results for input(s): AST, ALT, ALKPHOS, BILITOT, PROT, ALBUMIN in the last 72 hours. No results for input(s): LIPASE, AMYLASE in the last 72 hours. CBC:  Recent Labs  08/08/15 1205 08/09/15 0821  WBC 16.7* 12.5*  NEUTROABS 15.6* 10.7*  HGB 11.5* 11.3*  HCT 35.4* 35.3*  MCV 86.2 86.0  PLT 218 188   Cardiac Enzymes: No results for input(s): CKTOTAL, CKMB, CKMBINDEX, TROPONINI in the last 72 hours. BNP: Invalid input(s): POCBNP D-Dimer: No results for input(s): DDIMER in the last 72 hours. Hemoglobin A1C: No results for input(s): HGBA1C in the last 72 hours. Fasting Lipid Panel: No results for input(s): CHOL, HDL, LDLCALC, TRIG, CHOLHDL, LDLDIRECT in the last 72 hours. Thyroid Function Tests: No results for input(s): TSH, T4TOTAL, T3FREE, THYROIDAB in the last 72 hours.  Invalid input(s): FREET3 Anemia Panel: No results for input(s): VITAMINB12, FOLATE, FERRITIN, TIBC, IRON, RETICCTPCT in the last 72 hours.   PHYSICAL EXAM General: Well developed, well nourished, in no acute distress HEENT:  Normocephalic and atramatic Neck:  No JVD.  Lungs: Clear bilaterally to auscultation and  percussion. Heart: HRRR . Normal S1 and S2 without gallops or murmurs.  Abdomen: Bowel sounds are positive, abdomen soft and non-tender  Msk:  Back normal, normal gait. Normal strength and tone for age. Extremities: No clubbing, cyanosis or edema.   Neuro: Alert and oriented X 3. Psych:  Good affect, responds appropriately  TELEMETRY: Sinus rhythm with sinus bradycardia The top ASSESSMENT AND PLAN: Sinus bradycardia in the range of low 50s but is asymptomatic and can be discharged with follow-up in the office next week.  Active Problems:   Colon polyp    Dionisio David, MD, [facility] 08/11/2015 8:59 AM

## 2015-08-11 NOTE — Progress Notes (Signed)
Afebrile, stable vital signs. Patient reports frequent voiding good volumes. He has not been saving his urine. Minimal ambulation. No nausea or vomiting. Minimal incisional pain.  Lungs: Clear.  Cardiac: Slow rhythm.  Abdomen: Soft, nontender, mild distention.  Wounds: Dressings removed. No erythema or induration.  Extremities: No swelling.  Spoke with Dr. Brigitte Pulse from cardiology. Bradycardia 45-55 patient's pain is line. Never symptomatic in the past. (Not symptomatic now) sees.  We will work on his ambulation today, discontinue IV fluids and plan on discharge in the morning.

## 2015-08-11 NOTE — Progress Notes (Signed)
Dr. Bary Castilla gave verbal orders this morning to stop patient's IVF and ambulate. IVF stopped, will ambulate patient. VSS. Patient tolerated all medications well this morning. No complaints of pain at this time. Also spoke with Dr. Bary Castilla about no code status on file, stated he would put order in at this time. Omar [name]

## 2015-08-11 NOTE — Progress Notes (Addendum)
[place] at [place] NAME: Omar [name]    MR#:  [id number]  DATE OF BIRTH:  1940-07-24  SUBJECTIVE:  CHIEF COMPLAINT:  No chief complaint on file.  No complaint. Bradycardia at 50's.  REVIEW OF SYSTEMS:    Review of Systems  Constitutional: Negative for fever and chills.  HENT: Negative for sore throat.   Eyes: Negative for blurred vision, double vision and pain.  Respiratory: Negative for cough, hemoptysis, shortness of breath and wheezing.   Cardiovascular: Negative for chest pain, palpitations, orthopnea and leg swelling.  Gastrointestinal: no abdominal pain. Negative for heartburn, nausea, vomiting, diarrhea and constipation.  Genitourinary: Negative for dysuria and hematuria.  Musculoskeletal: Negative for back pain and joint pain.  Skin: Negative for rash.  Neurological: Negative for sensory change, speech change, focal weakness and headaches.  Endo/Heme/Allergies: Does not bruise/bleed easily.  Psychiatric/Behavioral: Negative for depression. The patient is not nervous/anxious.     DRUG ALLERGIES:  No Known Allergies  VITALS:  Blood pressure 118/52, pulse 50, temperature 97.6 F (36.4 C), temperature source Oral, resp. rate 18, height 5\' 11"  (1.803 m), weight 102.059 kg (225 lb), SpO2 92 %.  PHYSICAL EXAMINATION:   Physical Exam  GENERAL:  75 y.o.-year-old patient lying in the bed with no acute distress.  EYES: Pupils equal, round, reactive to light and accommodation. No scleral icterus. Extraocular muscles intact.  HEENT: Head atraumatic, normocephalic. Oropharynx and nasopharynx clear.  NECK:  Supple, no jugular venous distention. No thyroid enlargement, no tenderness.  LUNGS: Normal breath sounds bilaterally, no wheezing, rales, rhonchi. No use of accessory muscles of respiration.  CARDIOVASCULAR: S1, S2 . Bradycardia ABDOMEN: Soft, nondistended. Bowel sounds present. No organomegaly or mass. Dressing over  surgical wound. Tenderness in surgical wound. EXTREMITIES: No cyanosis, clubbing or edema b/l.    NEUROLOGIC: Cranial nerves II through XII are intact. No focal Motor or sensory deficits b/l.   PSYCHIATRIC: The patient is alert and oriented x 3.  SKIN: No obvious rash, lesion, or ulcer.   LABORATORY PANEL:   CBC  Recent Labs Lab 08/09/15 0821  WBC 12.5*  HGB 11.3*  HCT 35.3*  PLT 188   ------------------------------------------------------------------------------------------------------------------ Chemistries   Recent Labs Lab 08/09/15 0821  NA 135  K 4.1  CL 110  CO2 17*  GLUCOSE 107*  BUN 17  CREATININE 1.46*  CALCIUM 7.8*   ------------------------------------------------------------------------------------------------------------------  Cardiac Enzymes No results for input(s): TROPONINI in the last 168 hours. ------------------------------------------------------------------------------------------------------------------  RADIOLOGY:  No results found.   ASSESSMENT AND PLAN:   * Right hemicolectomy for tubular adenoma with high-grade dysplasia. Recovering well. Tolerating diet  * Atrial flutter He is not on any rate control medications at home. Heart rate is in 50s, but is asymptomatic and can be discharged with follow-up in the office next week per Dr. Humphrey Rolls. On aspirin  * Hypertension Resumed his imdur.   * Diabetes mellitus type 2, well controlled Held metformin. continue sliding scale insulin.  * Sleep apnea Continue CPAP at night.  * DVT prophylaxis. SCDs  Medically stable, discharge per surgeon. Sign off.  All the records are reviewed and case discussed with Care Management/Social Workerr. Management plans discussed with the patient, family and they are in agreement. Greater than 50% time was spent on coordination of care and face-to-face counseling. CODE STATUS: FULL  TOTAL TIME TAKING CARE OF THIS PATIENT: 28 minutes.   POSSIBLE  D/C IN 1-2 DAYS, DEPENDING ON CLINICAL CONDITION.  Demetrios Loll M.D  on 08/11/2015 at 1:24 PM  Between 7am to 6pm - Pager - [phone]  After 6pm go to [email or web address] - password [email or web address] [place] [facility]  Office  [phone]  CC: Primary care physician; Volanda Napoleon, MD  Note: This dictation was prepared with Dragon dictation along with smaller phrase technology. Any transcriptional errors that result from this process are unintentional.

## 2015-08-11 NOTE — Care Management Important Message (Signed)
Important Message  Patient Details  Name: Omar [name] MRN: [id number] Date of Birth: [birth year]   Medicare Important Message Given:  Yes    Juliann Pulse A Cassidie Veiga 08/11/2015, 10:26 AM

## 2015-08-12 LAB — CREATININE, SERUM
Creatinine, Ser: 1.29 mg/dL — ABNORMAL HIGH (ref 0.61–1.24)
GFR calc Af Amer: >60 mL/min (ref >60)
GFR calc non Af Amer: 53 mL/min — ABNORMAL LOW (ref >60)

## 2015-08-12 MED ORDER — HYDROCODONE-ACETAMINOPHEN 5-325 MG PO TABS: 1.0000 | ORAL_TABLET | ORAL | Status: DC | PRN

## 2015-08-12 NOTE — Final Progress Note (Signed)
AVSS. Good pain relief with oral Norco. Did not eat dinner last night because he found it terrible. Ambulating well. No SOB/ CP. Tolerating liquids well. + BM. Lungs: Clear. Cardio: Slow, irreg rate. (Cardiology aware of reported 3 degree heart block last PM by RN report). ABD: Less distended, still tympanitic. Minimal tenderness.  Wounds: Clean.  Extrem: Soft. Path: TVA with high grade dysplasia. No malignancy, 19 nodes negative. Plan: D/C home.

## 2015-08-12 NOTE — Progress Notes (Addendum)
Patient has had several episodes during admission of complete heart block. Dr. Humphrey Rolls on floor and notified. No new orders, patient is asymptotic. Per Dr Humphrey Rolls patient had prior history of this when seen in office before surgery, wants follow up appointment made on discharge.  Wilnette Kales   Dr. Bary Castilla also aware of third degree and cardiology decision.

## 2015-08-12 NOTE — Progress Notes (Signed)
SUBJECTIVE: Patient feeling much better   Filed Vitals:   08/11/15 1231 08/11/15 2005 08/12/15 0410 08/12/15 0909  BP: 118/52 152/69 130/55 142/74  Pulse: 50 43 64 51  Temp: 97.6 F (36.4 C) 98.2 F (36.8 C) 98.4 F (36.9 C)   TempSrc:  Oral Oral   Resp:  22 21   Height:      Weight:      SpO2: 92% 100% 94%     Intake/Output Summary (Last 24 hours) at 08/12/15 0936 Last data filed at 08/12/15 0704  Gross per 24 hour  Intake    360 ml  Output    925 ml  Net   -565 ml    LABS: Basic Metabolic Panel:  Recent Labs  08/12/15 0401  CREATININE 1.29*   Liver Function Tests: No results for input(s): AST, ALT, ALKPHOS, BILITOT, PROT, ALBUMIN in the last 72 hours. No results for input(s): LIPASE, AMYLASE in the last 72 hours. CBC: No results for input(s): WBC, NEUTROABS, HGB, HCT, MCV, PLT in the last 72 hours. Cardiac Enzymes: No results for input(s): CKTOTAL, CKMB, CKMBINDEX, TROPONINI in the last 72 hours. BNP: Invalid input(s): POCBNP D-Dimer: No results for input(s): DDIMER in the last 72 hours. Hemoglobin A1C: No results for input(s): HGBA1C in the last 72 hours. Fasting Lipid Panel: No results for input(s): CHOL, HDL, LDLCALC, TRIG, CHOLHDL, LDLDIRECT in the last 72 hours. Thyroid Function Tests: No results for input(s): TSH, T4TOTAL, T3FREE, THYROIDAB in the last 72 hours.  Invalid input(s): FREET3 to the top Anemia Panel: No results for input(s): VITAMINB12, FOLATE, FERRITIN, TIBC, IRON, RETICCTPCT in the last 72 hours.   PHYSICAL EXAM General: Well developed, well nourished, in no acute distress HEENT:  Normocephalic and atramatic Neck:  No JVD.  Lungs: Clear bilaterally to auscultation and percussion. Heart: HRRR . Normal S1 and S2 without gallops or murmurs.  Abdomen: Bowel sounds are positive, abdomen soft and non-tender  Msk:  Back normal, normal gait. Normal strength and tone for age. Extremities: No clubbing, cyanosis or edema.   Neuro: Alert and  oriented X 3. Psych:  Good affect, responds appropriately  TELEMETRY: Monitor shows intermittent A-V dissociation with bradycardia heart rate 50  ASSESSMENT AND PLAN: Reviewed strips with the Dr. Para Skeans for evaluation of permanent pacemaker. Patient is asymptomatic and it was decided that patient will be watched for symptoms and consider pacemaker if becomes symptomatic. She can go home with follow-up in the office and appointment been given to the patient next week.  Active Problems:   Colon polyp    Dionisio David, MD, [facility] 08/12/2015 9:36 AM

## 2015-08-12 NOTE — Progress Notes (Signed)
Patient d/c'd home. Education provided, no questions at this time. Patient picked up by family. Telemetry removed. Jodee Wagenaar R [place]   

## 2015-08-12 NOTE — Clinical Documentation Improvement (Signed)
  Cardiology  Would you please further clarify the medical condition related to the clinical findings?  Acute Renal Failure/Acute Kidney Injury  Acute Tubular Necrosis  Acute Renal Cortical Necrosis  Acute Renal Medullary Necrosis  Other  Clinically Undetermined  Document any associated diagnoses/conditions.   Supporting Information: Creatinine on admission was 1.59, then 1.46, then 1.29.  In progress note of 3/4, it is stated that pt appears prerenal and thus fluids should be increased.  Pt remained hypotensive and with bradycardia.  Please exercise your independent, professional judgment when responding. A specific answer is not anticipated or expected.   Thank You,  Clark [phone]

## 2015-08-13 LAB — GLUCOSE, CAPILLARY: Glucose-Capillary: 130 mg/dL — ABNORMAL HIGH (ref 65–99)

## 2015-08-14 ENCOUNTER — Encounter: Payer: Self-pay | Admitting: General Surgery

## 2015-08-19 ENCOUNTER — Encounter: Payer: Self-pay | Admitting: General Surgery

## 2015-08-19 ENCOUNTER — Ambulatory Visit: Payer: Medicare Other | Admitting: General Surgery

## 2015-08-19 ENCOUNTER — Ambulatory Visit (INDEPENDENT_AMBULATORY_CARE_PROVIDER_SITE_OTHER): Payer: Medicare Other | Admitting: General Surgery

## 2015-08-19 VITALS — BP 144/78 | HR 52 | Resp 14 | Ht 71.0 in | Wt 224.0 lb

## 2015-08-19 DIAGNOSIS — D126 Benign neoplasm of colon, unspecified: Secondary | ICD-10-CM | POA: Insufficient documentation

## 2015-08-19 DIAGNOSIS — K6389 Other specified diseases of intestine: Secondary | ICD-10-CM

## 2015-08-19 DIAGNOSIS — R0602 Shortness of breath: Secondary | ICD-10-CM | POA: Insufficient documentation

## 2015-08-19 DIAGNOSIS — K635 Polyp of colon: Secondary | ICD-10-CM

## 2015-08-19 NOTE — Progress Notes (Signed)
Patient ID: Omar [name], male   DOB: [birth year], 75 y.o.   MRN: [id number]  Chief Complaint  Patient presents with  . Routine Post Op    HPI Omar [name] is a 75 y.o. male.  Here today for postoperative visit, right colon resection for colon mass on 08-08-15. He states he is doing well. No pain. Bowels area regular and daily, no bleeding. Denies any nausea or vomiting, appetite is good. He has not restarted his Celebrex, methotrexate or metformin. His blood sugars have been below 140.  HPI  Past Medical History  Diagnosis Date  . Anxiety   . GERD (gastroesophageal reflux disease)   . Hypertension   . Heart murmur   . Hypercholesterolemia   . Sleep apnea   . Prostate cancer ([place]) 01/01/13, 01/30/14    Gleason 3+4=7, volume 46.6 cc  . Arthritis     rheumatoid  . Atrial fibrillation (HCC)     hx of  . Rheumatoid arthritis ([place])   . S/P radiation therapy  04/03/2014 through 06/04/2014                                                       Prostate 7800 cGy in 40 sessions                          . Diabetes mellitus without complication ([place])   . Colon polyp 07-07-15    TUBULAR ADENOMA WITH AT LEAST HIGH-GRADE / Dr Rayann Heman    Past Surgical History  Procedure Laterality Date  . Tonsillectomy    . Prostate biopsy  01/01/13, 01/30/14    Gleason 3+3=6, vol 46.6 cc  . Abdominal aortic aneurysm repair  11/2013  . Nasal sinus surgery    . Uvula surgery      for sleep apnea  . Colonoscopy with propofol N/A 07/07/2015    Procedure: COLONOSCOPY WITH PROPOFOL;  Surgeon: Josefine Class, MD;  Location: [facility] ENDOSCOPY;  Service: Endoscopy;  Laterality: N/A;  . Laparoscopic right colectomy Right 08/08/2015    Procedure: LAPAROSCOPIC RIGHT COLECTOMY;  Surgeon: Robert Bellow, MD;  Location: [facility] ORS;  Service: General;  Laterality: Right;    Family History  Problem Relation Age of Onset  . Heart attack Mother   . Cirrhosis Father   . Cancer Brother     pancreatic  . Diabetes  Brother   . Diabetes Daughter     medication induced for cancer treatments  . Cancer Daughter     breast, brain    Social History Social History  Substance Use Topics  . Smoking status: Current Every Day Smoker -- 2.00 packs/day for 50 years    Types: Cigarettes  . Smokeless tobacco: Never Used  . Alcohol Use: 0.6 oz/week    1 Cans of beer per week     Comment: occasionally    No Known Allergies  Current Outpatient Prescriptions  Medication Sig Dispense Refill  . amLODipine-benazepril (LOTREL) 5-20 MG per capsule Take 1 capsule by mouth daily.    Marland Kitchen aspirin EC 81 MG tablet Take 81 mg by mouth daily.    Marland Kitchen atorvastatin (LIPITOR) 80 MG tablet Take 80 mg by mouth at bedtime.     . busPIRone (BUSPAR) 5 MG tablet Take 5 mg by mouth 2 (two)  times daily.     . Butalbital-APAP-Caffeine 50-300-40 MG CAPS Take 1 capsule by mouth every 6 (six) hours as needed (for migraines).     . celecoxib (CELEBREX) 200 MG capsule Take 200 mg by mouth daily.     . cyclobenzaprine (FLEXERIL) 5 MG tablet Take 5 mg by mouth 3 (three) times daily as needed for muscle spasms.    . fexofenadine (ALLEGRA) 180 MG tablet Take 180 mg by mouth daily.    . fluticasone (FLONASE) 50 MCG/ACT nasal spray Place 2 sprays into both nostrils daily as needed for rhinitis.     . Fluticasone Furoate-Vilanterol (BREO ELLIPTA) 100-25 MCG/INH AEPB Inhale 1 puff into the lungs daily.     . folic acid (FOLVITE) 1 MG tablet Take 1 mg by mouth daily.    . isosorbide mononitrate (IMDUR) 30 MG 24 hr tablet Take 30 mg by mouth daily.    . pantoprazole (PROTONIX) 40 MG tablet Take 40 mg by mouth daily.    . tamsulosin (FLOMAX) 0.4 MG CAPS capsule Take 1 capsule (0.4 mg total) by mouth daily. 30 capsule 3  . triamcinolone cream (KENALOG) 0.1 % Apply 1 application topically 2 (two) times daily.     No current facility-administered medications for this visit.    Review of Systems Review of Systems  Constitutional: Negative.    Respiratory: Negative.   Cardiovascular: Negative.   Gastrointestinal: Negative for nausea, vomiting, diarrhea, constipation and blood in stool.    Blood pressure 144/78, pulse 52, resp. rate 14, height 5\' 11"  (1.803 m), weight 224 lb (101.606 kg).  Physical Exam Physical Exam  Constitutional: He is oriented to person, place, and time. He appears well-developed and well-nourished.  Cardiovascular: Normal rate, regular rhythm and normal heart sounds.   Pulmonary/Chest: Effort normal and breath sounds normal.  Abdominal: Soft. Normal appearance. There is no tenderness. A hernia is present. Hernia confirmed positive in the right inguinal area and confirmed positive in the left inguinal area.    Abdominal incision well healed.  Neurological: He is alert and oriented to person, place, and time.  Skin: Skin is warm and dry.  Psychiatric: His behavior is normal.    Data Reviewed  Surgical Pathology  CASE: [id number]  PATIENT: Omar [name]  Surgical Pathology Report      SPECIMEN SUBMITTED:  A. Colon, right   CLINICAL HISTORY:  None provided   PRE-OPERATIVE DIAGNOSIS:  Right colon polyp   POST-OPERATIVE DIAGNOSIS:  Same as pre-op      DIAGNOSIS:  A. RIGHT COLON; RIGHT COLECTOMY:  - TUBULOVILLOUS ADENOMA WITH FOCAL HIGH-GRADE DYSPLASIA (2.7 CM).  - TUBULAR ADENOMA (1.4 CM).  - LIPOMATOUS ILEOCECAL VALVE.  - UNREMARKABLE APPENDIX.  - THE MARGINS OF RESECTION ARE NEGATIVE.  - NO TUMOR SEEN IN NINETEEN LYMPH NODES (0/19).               Assessment    Doing well s/p right colectomy. Hernias remain asymptomatic.      Plan         Follow up 1 month. Repeat colonoscopy needed in future, likely 1-2 years with high grade dysplasia noted in polyp.  . Patient will resume Celebrex, followed by methotrexate for control of arthritis symptoms (which are now in remission).  Follow up with Dr Elijio Miles regarding metformin  ( Had been instructed on hospital  discharge to not resume metformin until AM BS was > 140.  Has not recorded a value > 130 since getting home.    Resume  activities as tolerated, limit weight to 20  Pounds for the present. . Proper lifting techniques reviewed.    PCP:  Tejan-Sie This information has been scribed by Karie Fetch RNBC.    Robert Bellow 08/19/2015, 9:48 PM

## 2015-08-19 NOTE — Patient Instructions (Addendum)
Follow up in 1 month  Resume activities as tolerated. Proper lifting techniques reviewed. May resume methotrexate first if needed, and resume Celebrex as needed Follow up with Dr Elijio Miles regarding metformin

## 2015-08-25 NOTE — Discharge Summary (Signed)
Physician Discharge Summary  Patient ID: Omar [name] MRN: [id number] DOB/AGE: 1940-08-14 75 y.o.  Admit date: 08/08/2015 Discharge date: 08/25/2015  Admission Diagnoses: Right colon polyp  Discharge Diagnoses:  Tubulovillous polyp of the descending colon  Discharged Condition: good  Hospital Course: Patient underwent elective right hemicolectomy for tubulovillous adenoma of the right colon. Unremarkable postoperative course.  Consults: None  Significant Diagnostic Studies: labs: Pathology showed a tubulovillous adenoma with high-grade dysplasia. No evidence of malignancy.   Discharge Exam: Blood pressure 142/74, pulse 51, temperature 98.4 F (36.9 C), temperature source Oral, resp. rate 21, height 5\' 11"  (1.803 m), weight 225 lb (102.059 kg), SpO2 94 %. Cardiopulmonary exam was unremarkable.  Abdomen soft and nontender.  Incision healing well.   Disposition: 01-Home or Self Care  Discharge Instructions    Diet Carb Modified    Complete by:  As directed      Discharge instructions    Complete by:  As directed   Check blood sugars daily. When morning value is consistently over 140, resume your Metformin, 500 mg tablet twice a day. Resume amLODipine-benazepril (Lotrel) on Wednesday, March 8th.  Tylenol: If needed for soreness. Norco (hydrocodone): If needed for pain. This medication may constipate. Use milk of magnesia ( 1 tablespoon, 15 cc) if needed for constipation. No lifting over 10 pounds.  No driving until pain free. OK to shower. Use your incentive spirometer frequently at home. Walk....walk....walk..     Increase activity slowly    Complete by:  As directed             Medication List    STOP taking these medications        clopidogrel 75 MG tablet  Commonly known as:  PLAVIX     metFORMIN 500 MG tablet  Commonly known as:  GLUCOPHAGE      TAKE these medications        amLODipine-benazepril 5-20 MG capsule  Commonly known as:  LOTREL  Take 1  capsule by mouth daily.     aspirin EC 81 MG tablet  Take 81 mg by mouth daily.     atorvastatin 80 MG tablet  Commonly known as:  LIPITOR  Take 80 mg by mouth at bedtime.     BREO ELLIPTA 100-25 MCG/INH Aepb  Generic drug:  fluticasone furoate-vilanterol  Inhale 1 puff into the lungs daily.     busPIRone 5 MG tablet  Commonly known as:  BUSPAR  Take 5 mg by mouth 2 (two) times daily.     Butalbital-APAP-Caffeine 50-300-40 MG Caps  Take 1 capsule by mouth every 6 (six) hours as needed (for migraines).     celecoxib 200 MG capsule  Commonly known as:  CELEBREX  Take 200 mg by mouth daily.     cyclobenzaprine 5 MG tablet  Commonly known as:  FLEXERIL  Take 5 mg by mouth 3 (three) times daily as needed for muscle spasms.     fexofenadine 180 MG tablet  Commonly known as:  ALLEGRA  Take 180 mg by mouth daily.     fluticasone 50 MCG/ACT nasal spray  Commonly known as:  FLONASE  Place 2 sprays into both nostrils daily as needed for rhinitis.     folic acid 1 MG tablet  Commonly known as:  FOLVITE  Take 1 mg by mouth daily.     isosorbide mononitrate 30 MG 24 hr tablet  Commonly known as:  IMDUR  Take 30 mg by mouth daily.  pantoprazole 40 MG tablet  Commonly known as:  PROTONIX  Take 40 mg by mouth daily.     tamsulosin 0.4 MG Caps capsule  Commonly known as:  FLOMAX  Take 1 capsule (0.4 mg total) by mouth daily.     triamcinolone cream 0.1 %  Commonly known as:  KENALOG  Apply 1 application topically 2 (two) times daily.           Follow-up Information    Follow up with Bary Castilla Forest Gleason, MD In 1 week.   Specialties:  General Surgery, Radiology   Why:  Tuesday, March 14th at 1 PM.    Contact information:   [address] [place] [address] [phone]       Follow up with Dionisio David, MD On 08/20/2015.   Specialty:  Cardiology   Why:  10:15am   Contact information:   [place] [address] [phone]        Signed: Robert Bellow 08/25/2015, 9:12 PM

## 2015-09-16 ENCOUNTER — Ambulatory Visit (INDEPENDENT_AMBULATORY_CARE_PROVIDER_SITE_OTHER): Payer: Medicare Other | Admitting: Sports Medicine

## 2015-09-16 ENCOUNTER — Encounter: Payer: Self-pay | Admitting: Sports Medicine

## 2015-09-16 DIAGNOSIS — M79673 Pain in unspecified foot: Secondary | ICD-10-CM | POA: Diagnosis not present

## 2015-09-16 DIAGNOSIS — M2040 Other hammer toe(s) (acquired), unspecified foot: Secondary | ICD-10-CM

## 2015-09-16 DIAGNOSIS — B351 Tinea unguium: Secondary | ICD-10-CM

## 2015-09-16 DIAGNOSIS — E119 Type 2 diabetes mellitus without complications: Secondary | ICD-10-CM | POA: Diagnosis not present

## 2015-09-16 DIAGNOSIS — I739 Peripheral vascular disease, unspecified: Secondary | ICD-10-CM | POA: Diagnosis not present

## 2015-09-16 DIAGNOSIS — E1142 Type 2 diabetes mellitus with diabetic polyneuropathy: Secondary | ICD-10-CM

## 2015-09-16 NOTE — Progress Notes (Signed)
Patient ID: Omar [name], male   DOB: 03-22-1941, 75 y.o.   MRN: [id number]  Subjective: Omar [name] is a 75 y.o. male patient with history of type 2 diabetes who presents to office today complaining of long, painful nails  while ambulating in shoes. Patient states that the glucose reading this morning was 109mg /dl. Patient denies any new changes in medication or new problems. Patient denies any new cramping, numbness, burning or tingling in the legs. No other pedal concerns noted.   Past Medical History:  Patient Active Problem List   Diagnosis Date Noted  . Tubulovillous adenoma polyp of colon 08/19/2015  . Hernia of abdominal cavity 07/31/2015  . Abdominal pain, right upper quadrant 07/11/2015  . Malignant neoplasm of prostate ([place]) 02/20/2014    Medications:  Current Outpatient Prescriptions on File Prior to Visit  Medication Sig Dispense Refill  . amLODipine-benazepril (LOTREL) 5-20 MG per capsule Take 1 capsule by mouth daily.    Marland Kitchen aspirin EC 81 MG tablet Take 81 mg by mouth daily.    Marland Kitchen atorvastatin (LIPITOR) 80 MG tablet Take 80 mg by mouth at bedtime.     . busPIRone (BUSPAR) 5 MG tablet Take 5 mg by mouth 2 (two) times daily.     . Butalbital-APAP-Caffeine 50-300-40 MG CAPS Take 1 capsule by mouth every 6 (six) hours as needed (for migraines).     . celecoxib (CELEBREX) 200 MG capsule Take 200 mg by mouth daily.     . cyclobenzaprine (FLEXERIL) 5 MG tablet Take 5 mg by mouth 3 (three) times daily as needed for muscle spasms.    . fexofenadine (ALLEGRA) 180 MG tablet Take 180 mg by mouth daily.    . fluticasone (FLONASE) 50 MCG/ACT nasal spray Place 2 sprays into both nostrils daily as needed for rhinitis.     . Fluticasone Furoate-Vilanterol (BREO ELLIPTA) 100-25 MCG/INH AEPB Inhale 1 puff into the lungs daily.     . folic acid (FOLVITE) 1 MG tablet Take 1 mg by mouth daily.    . isosorbide mononitrate (IMDUR) 30 MG 24 hr tablet Take 30 mg by mouth daily.    .  pantoprazole (PROTONIX) 40 MG tablet Take 40 mg by mouth daily.    . tamsulosin (FLOMAX) 0.4 MG CAPS capsule Take 1 capsule (0.4 mg total) by mouth daily. 30 capsule 3  . triamcinolone cream (KENALOG) 0.1 % Apply 1 application topically 2 (two) times daily.     No current facility-administered medications on file prior to visit.    Allergies: No Known Allergies  Labs: HEMOGLOBIN A1C- No recent lab on file  Objective: General: Patient is awake, alert, and oriented x 3 and in no acute distress.  Integument: Skin is warm, dry and supple bilateral. Nails are long, thickened and  dystrophic with subungual debris, consistent with onychomycosis, 1-5 bilateral. To right 2nd hammertoe there is reactive keratosis at distal tuft with no signs of infection. Remaining integument unremarkable.  Vasculature:  Dorsalis Pedis pulse 1/4 bilateral. Posterior Tibial pulse  1/4 bilateral.  Capillary fill time <3 sec 1-5 bilateral. No hair growth to the level of the digits. Temperature gradient within normal limits. Mild varicosities present bilateral. No edema present bilateral. No tenderness to calves.   Neurology: The patient has intact sensation measured with a 5.07/10g Semmes Weinstein Monofilament at all pedal sites bilateral . Vibratory sensation diminished bilateral with tuning fork. No Babinski sign present bilateral.   Musculoskeletal: Hammertoe deformities noted bilateral with right 2nd most contracted with  minimal reactive keratosis. Muscular strength 5/5 in all lower extremity muscular groups bilateral.  Assessment and Plan: Problem List Items Addressed This Visit    None    Visit Diagnoses    Dermatophytosis of nail    -  Primary    Hammertoe, unspecified laterality        callus at right 2nd toe    Foot pain, unspecified laterality        PVD (peripheral vascular disease) ([place])        Diabetic polyneuropathy associated with type 2 diabetes mellitus ([place])        early      -Examined  patient. -Discussed and educated patient on diabetic foot care, especially with  regards to the vascular, neurological and muschloskeletal systems.  -Stressed the importance of good glycemic control and the detriment of not  controlling glucose levels in relation to the foot. -Mechanically debrided all nails 1-5 bilateral using sterile nail nipper without incident  -Gave crest pad to right 2nd toe and instructed on use -Answered all patient questions [employer] diabetic shoe order form was completed again for patient; office to contact primary care for approval / certification;  Office to arrange shoe fitting and dispensing. -Patient to return in 3 months for at risk foot care -Patient advised to call the office if any problems or questions arise in the meantime.  Landis Martins, DPM

## 2015-09-18 ENCOUNTER — Ambulatory Visit (INDEPENDENT_AMBULATORY_CARE_PROVIDER_SITE_OTHER): Payer: Medicare Other | Admitting: General Surgery

## 2015-09-18 ENCOUNTER — Encounter: Payer: Self-pay | Admitting: General Surgery

## 2015-09-18 VITALS — BP 122/68 | HR 74 | Resp 14 | Ht 71.0 in | Wt 220.0 lb

## 2015-09-18 DIAGNOSIS — K635 Polyp of colon: Secondary | ICD-10-CM

## 2015-09-18 DIAGNOSIS — D126 Benign neoplasm of colon, unspecified: Secondary | ICD-10-CM

## 2015-09-18 NOTE — Progress Notes (Signed)
Patient ID: Omar [name], male   DOB: August 13, 1940, 75 y.o.   MRN: [id number]  Chief Complaint  Patient presents with  . Follow-up    colectomy     HPI Omar [name] is a 75 y.o. male postoperative visit, right colon resection for colon mass on 08-08-15. He states he is doing well. No pain, no bleeding.  He reports intermittently hearing his bowels grumbling, but no pain. No association between these auditory episodes and loose stools. No clear-cut dietary pattern to when he has a loose bowel movement has yet to be noted.  HPI  Past Medical History  Diagnosis Date  . Anxiety   . GERD (gastroesophageal reflux disease)   . Hypertension   . Heart murmur   . Hypercholesterolemia   . Sleep apnea   . Prostate cancer ([place]) 01/01/13, 01/30/14    Gleason 3+4=7, volume 46.6 cc  . Arthritis     rheumatoid  . Atrial fibrillation (HCC)     hx of  . Rheumatoid arthritis ([place])   . S/P radiation therapy  04/03/2014 through 06/04/2014                                                       Prostate 7800 cGy in 40 sessions                          . Diabetes mellitus without complication ([place])   . Colon polyp 07-07-15    TUBULAR ADENOMA WITH AT LEAST HIGH-GRADE / Dr Rayann Heman    Past Surgical History  Procedure Laterality Date  . Tonsillectomy    . Prostate biopsy  01/01/13, 01/30/14    Gleason 3+3=6, vol 46.6 cc  . Abdominal aortic aneurysm repair  11/2013  . Nasal sinus surgery    . Uvula surgery      for sleep apnea  . Colonoscopy with propofol N/A 07/07/2015    Procedure: COLONOSCOPY WITH PROPOFOL;  Surgeon: Josefine Class, MD;  Location: [facility] ENDOSCOPY;  Service: Endoscopy;  Laterality: N/A;  . Laparoscopic right colectomy Right 08/08/2015    Procedure: LAPAROSCOPIC RIGHT COLECTOMY;  Surgeon: Robert Bellow, MD;  Location: [facility] ORS;  Service: General;  Laterality: Right;    Family History  Problem Relation Age of Onset  . Heart attack Mother   . Cirrhosis Father   . Cancer Brother      pancreatic  . Diabetes Brother   . Diabetes Daughter     medication induced for cancer treatments  . Cancer Daughter     breast, brain    Social History Social History  Substance Use Topics  . Smoking status: Current Every Day Smoker -- 2.00 packs/day for 50 years    Types: Cigarettes  . Smokeless tobacco: Never Used  . Alcohol Use: 0.6 oz/week    1 Cans of beer per week     Comment: occasionally    No Known Allergies  Current Outpatient Prescriptions  Medication Sig Dispense Refill  . amLODipine-benazepril (LOTREL) 5-20 MG per capsule Take 1 capsule by mouth daily.    Marland Kitchen aspirin EC 81 MG tablet Take 81 mg by mouth daily.    Marland Kitchen atorvastatin (LIPITOR) 80 MG tablet Take 80 mg by mouth at bedtime.     . busPIRone (BUSPAR) 5 MG tablet  Take 5 mg by mouth 2 (two) times daily.     . Butalbital-APAP-Caffeine 50-300-40 MG CAPS Take 1 capsule by mouth every 6 (six) hours as needed (for migraines).     . celecoxib (CELEBREX) 200 MG capsule Take 200 mg by mouth daily.     . cyclobenzaprine (FLEXERIL) 5 MG tablet Take 5 mg by mouth 3 (three) times daily as needed for muscle spasms.    . fexofenadine (ALLEGRA) 180 MG tablet Take 180 mg by mouth daily.    . fluticasone (FLONASE) 50 MCG/ACT nasal spray Place 2 sprays into both nostrils daily as needed for rhinitis.     . Fluticasone Furoate-Vilanterol (BREO ELLIPTA) 100-25 MCG/INH AEPB Inhale 1 puff into the lungs daily.     . folic acid (FOLVITE) 1 MG tablet Take 1 mg by mouth daily.    . isosorbide mononitrate (IMDUR) 30 MG 24 hr tablet Take 30 mg by mouth daily.    . pantoprazole (PROTONIX) 40 MG tablet Take 40 mg by mouth daily.    . tamsulosin (FLOMAX) 0.4 MG CAPS capsule Take 1 capsule (0.4 mg total) by mouth daily. 30 capsule 3  . triamcinolone cream (KENALOG) 0.1 % Apply 1 application topically 2 (two) times daily.     No current facility-administered medications for this visit.    Review of Systems Review of Systems   Constitutional: Negative.   Respiratory: Negative.   Cardiovascular: Negative.   Gastrointestinal: Positive for diarrhea.    Blood pressure 122/68, pulse 74, resp. rate 14, height 5\' 11"  (1.803 m), weight 220 lb (99.791 kg).  Physical Exam Physical Exam  Constitutional: He is oriented to person, place, and time. He appears well-developed and well-nourished.  Cardiovascular: Normal rate, regular rhythm and normal heart sounds.   Pulmonary/Chest: Effort normal and breath sounds normal.  Abdominal: Soft. Normal appearance and bowel sounds are normal. There is no tenderness.    Incision is clean and well healed.   Neurological: He is alert and oriented to person, place, and time.  Skin: Skin is warm and dry.       Assessment    Good recovery status post laparoscopically assisted right hemicolectomy for tubulovillous adenoma with high-grade dysplasia.    Plan    The patient will begin to increase his activity as tolerated. Proper lifting technique reviewed. His hernias remain asymptomatic and at this time will postpone any consideration for surgical intervention.  He is having intermittent loose stools, possibly related bile acid malabsorption. We will hold on initiation of Questran therapy as his stools may continue to improve without medical intervention.  He is aware that Arther Dames, M.D. is moving to [facility] later this year. He can have a follow-up colonoscopy in 2018 there or have it completed locally.   Patient to return in two months and colonoscopy in one year. PCP:  Tejan-Sie This information has been scribed by Gaspar Cola CMA.    Robert Bellow 09/18/2015, 9:59 AM

## 2015-09-18 NOTE — Patient Instructions (Signed)
Patient to return in two months.  

## 2015-11-12 ENCOUNTER — Encounter: Payer: Self-pay | Admitting: *Deleted

## 2015-11-19 ENCOUNTER — Ambulatory Visit (INDEPENDENT_AMBULATORY_CARE_PROVIDER_SITE_OTHER): Payer: Medicare Other | Admitting: General Surgery

## 2015-11-19 ENCOUNTER — Encounter: Payer: Self-pay | Admitting: General Surgery

## 2015-11-19 VITALS — BP 114/62 | Ht 71.0 in | Wt 223.0 lb

## 2015-11-19 DIAGNOSIS — D126 Benign neoplasm of colon, unspecified: Secondary | ICD-10-CM

## 2015-11-19 DIAGNOSIS — K635 Polyp of colon: Secondary | ICD-10-CM

## 2015-11-19 NOTE — Progress Notes (Signed)
Patient ID: Omar [name], male   DOB: [birth year], 75 y.o.   MRN: [id number]  Chief Complaint  Patient presents with  . Routine Post Op    HPI Omar [name] is a 75 y.o. male Here today for his follow up ,right colon resection for colon mass on 08-08-15. He states he is doing well. Bowels moving daily. He states he is still on cough medication but has finished antibiotics for an upper respiratory infection.  I personally reviewed the patient's history.  HPI  Past Medical History  Diagnosis Date  . Anxiety   . GERD (gastroesophageal reflux disease)   . Hypertension   . Heart murmur   . Hypercholesterolemia   . Sleep apnea   . Prostate cancer ([place]) 01/01/13, 01/30/14    Gleason 3+4=7, volume 46.6 cc  . Arthritis     rheumatoid  . Atrial fibrillation (HCC)     hx of  . Rheumatoid arthritis ([place])   . S/P radiation therapy  04/03/2014 through 06/04/2014                                                       Prostate 7800 cGy in 40 sessions                          . Diabetes mellitus without complication ([place])   . Colon polyp 07-07-15    TUBULAR ADENOMA WITH AT LEAST HIGH-GRADE / Dr Rayann Heman    Past Surgical History  Procedure Laterality Date  . Tonsillectomy    . Prostate biopsy  01/01/13, 01/30/14    Gleason 3+3=6, vol 46.6 cc  . Abdominal aortic aneurysm repair  11/2013  . Nasal sinus surgery    . Uvula surgery      for sleep apnea  . Colonoscopy with propofol N/A 07/07/2015    Procedure: COLONOSCOPY WITH PROPOFOL;  Surgeon: Josefine Class, MD;  Location: [facility] ENDOSCOPY;  Service: Endoscopy;  Laterality: N/A;  . Laparoscopic right colectomy Right 08/08/2015    Procedure: LAPAROSCOPIC RIGHT COLECTOMY;  Surgeon: Robert Bellow, MD;  Location: [facility] ORS;  Service: General;  Laterality: Right;  . Colon surgery  March 2017    Right hemicolectomy for tubulovillous adenoma with high-grade dysplasia.    Family History  Problem Relation Age of Onset  . Heart attack Mother   .  Cirrhosis Father   . Cancer Brother     pancreatic  . Diabetes Brother   . Diabetes Daughter     medication induced for cancer treatments  . Cancer Daughter     breast, brain    Social History Social History  Substance Use Topics  . Smoking status: Current Every Day Smoker -- 2.00 packs/day for 50 years    Types: Cigarettes  . Smokeless tobacco: Never Used  . Alcohol Use: 0.6 oz/week    1 Cans of beer per week     Comment: occasionally    No Known Allergies  Current Outpatient Prescriptions  Medication Sig Dispense Refill  . amLODipine-benazepril (LOTREL) 5-20 MG per capsule Take 1 capsule by mouth daily.    Marland Kitchen aspirin EC 81 MG tablet Take 81 mg by mouth daily.    Marland Kitchen atorvastatin (LIPITOR) 80 MG tablet Take 80 mg by mouth at bedtime.     Marland Kitchen  busPIRone (BUSPAR) 5 MG tablet Take 5 mg by mouth 2 (two) times daily.     . Butalbital-APAP-Caffeine 50-300-40 MG CAPS Take 1 capsule by mouth every 6 (six) hours as needed (for migraines).     . celecoxib (CELEBREX) 200 MG capsule Take 200 mg by mouth daily.     Marland Kitchen CHERATUSSIN AC 100-10 MG/5ML syrup 4 (four) times daily as needed.     . cyclobenzaprine (FLEXERIL) 5 MG tablet Take 5 mg by mouth 3 (three) times daily as needed for muscle spasms.    . fexofenadine (ALLEGRA) 180 MG tablet Take 180 mg by mouth daily.    . fluticasone (FLONASE) 50 MCG/ACT nasal spray Place 2 sprays into both nostrils daily as needed for rhinitis.     . Fluticasone Furoate-Vilanterol (BREO ELLIPTA) 100-25 MCG/INH AEPB Inhale 1 puff into the lungs daily.     . folic acid (FOLVITE) 1 MG tablet Take 1 mg by mouth daily.    . isosorbide mononitrate (IMDUR) 30 MG 24 hr tablet Take 30 mg by mouth daily.    . pantoprazole (PROTONIX) 40 MG tablet Take 40 mg by mouth daily.    . tamsulosin (FLOMAX) 0.4 MG CAPS capsule Take 1 capsule (0.4 mg total) by mouth daily. 30 capsule 3  . triamcinolone cream (KENALOG) 0.1 % Apply 1 application topically 2 (two) times daily.     No  current facility-administered medications for this visit.    Review of Systems Review of Systems  Constitutional: Negative.   Respiratory: Positive for cough.   Cardiovascular: Negative.     Blood pressure 114/62, height '5\' 11"'  (1.803 m), weight 223 lb (101.152 kg).  Physical Exam Physical Exam  Constitutional: He is oriented to person, place, and time. He appears well-developed and well-nourished.  HENT:  Mouth/Throat: Oropharynx is clear and moist.  Eyes: Conjunctivae are normal. No scleral icterus.  Cardiovascular: Normal rate, regular rhythm and normal heart sounds.   Pulmonary/Chest: Effort normal and breath sounds normal.  Abdominal: Soft. Normal appearance and bowel sounds are normal. There is no tenderness.    Neurological: He is alert and oriented to person, place, and time.  Skin: Skin is warm and dry.  Psychiatric: His behavior is normal.    Data Reviewed March 2017 right colon polyp showed a tubulovillous adenoma with high-grade dysplasia.  Assessment    Doing well status post right hemicolectomy.  Clear cardiopulmonary exam.    Plan    The patient had his colonoscopy with Arther Dames,, M.D. The patient did not want to go to [facility] to have a follow-up study completed. I offered to have him meet one of the remaining partners in Dr.Rein's group at the [facility].  He declined, reporting that he has met enough new doctors.   We'll plan to have him return to this office in spring 201 and complete a colonoscopy at that time.8      Follow up in 9 months with a colonoscopy.  PCP:  Tejan-Sie This information has been scribed by Karie Fetch RN, BSN,BC.   Robert Bellow 11/19/2015, 9:31 AM

## 2015-11-19 NOTE — Patient Instructions (Signed)
The patient is aware to call back for any questions or concerns.  

## 2015-12-10 ENCOUNTER — Ambulatory Visit (INDEPENDENT_AMBULATORY_CARE_PROVIDER_SITE_OTHER): Payer: Medicare Other | Admitting: *Deleted

## 2015-12-10 DIAGNOSIS — I739 Peripheral vascular disease, unspecified: Secondary | ICD-10-CM

## 2015-12-10 DIAGNOSIS — E1142 Type 2 diabetes mellitus with diabetic polyneuropathy: Secondary | ICD-10-CM

## 2015-12-10 DIAGNOSIS — M2040 Other hammer toe(s) (acquired), unspecified foot: Secondary | ICD-10-CM

## 2015-12-16 ENCOUNTER — Encounter: Payer: Self-pay | Admitting: Sports Medicine

## 2015-12-16 ENCOUNTER — Ambulatory Visit (INDEPENDENT_AMBULATORY_CARE_PROVIDER_SITE_OTHER): Payer: Medicare Other | Admitting: Sports Medicine

## 2015-12-16 DIAGNOSIS — E119 Type 2 diabetes mellitus without complications: Secondary | ICD-10-CM | POA: Insufficient documentation

## 2015-12-16 DIAGNOSIS — M79673 Pain in unspecified foot: Secondary | ICD-10-CM

## 2015-12-16 DIAGNOSIS — E1142 Type 2 diabetes mellitus with diabetic polyneuropathy: Secondary | ICD-10-CM

## 2015-12-16 DIAGNOSIS — B351 Tinea unguium: Secondary | ICD-10-CM | POA: Diagnosis not present

## 2015-12-16 DIAGNOSIS — I739 Peripheral vascular disease, unspecified: Secondary | ICD-10-CM | POA: Diagnosis not present

## 2015-12-16 DIAGNOSIS — M2040 Other hammer toe(s) (acquired), unspecified foot: Secondary | ICD-10-CM

## 2015-12-16 DIAGNOSIS — K7689 Other specified diseases of liver: Secondary | ICD-10-CM | POA: Insufficient documentation

## 2015-12-16 DIAGNOSIS — K760 Fatty (change of) liver, not elsewhere classified: Secondary | ICD-10-CM | POA: Insufficient documentation

## 2015-12-16 DIAGNOSIS — G4730 Sleep apnea, unspecified: Secondary | ICD-10-CM | POA: Insufficient documentation

## 2015-12-16 DIAGNOSIS — I10 Essential (primary) hypertension: Secondary | ICD-10-CM | POA: Insufficient documentation

## 2015-12-16 DIAGNOSIS — E785 Hyperlipidemia, unspecified: Secondary | ICD-10-CM | POA: Insufficient documentation

## 2015-12-16 DIAGNOSIS — I4891 Unspecified atrial fibrillation: Secondary | ICD-10-CM | POA: Insufficient documentation

## 2015-12-16 DIAGNOSIS — F419 Anxiety disorder, unspecified: Secondary | ICD-10-CM | POA: Insufficient documentation

## 2015-12-16 DIAGNOSIS — T7840XA Allergy, unspecified, initial encounter: Secondary | ICD-10-CM | POA: Insufficient documentation

## 2015-12-16 NOTE — Progress Notes (Signed)
Patient ID: Omar [name], male   DOB: May 25, 1941, 75 y.o.   MRN: [id number]  Subjective: Omar [name] is a 75 y.o. male patient with history of type 2 diabetes who presents to office today complaining of long, painful nails  while ambulating in shoes. Patient states that the glucose reading last week was 110 mg/dl. Patient denies any new changes in medication or new problems. Patient denies any new cramping, numbness, burning or tingling in the legs. No other pedal concerns noted.   Past Medical History:  Patient Active Problem List   Diagnosis Date Noted  . Allergic state 12/16/2015  . Anxiety 12/16/2015  . Atrial fibrillation ([place]) 12/16/2015  . Diabetes mellitus ([place]) 12/16/2015  . HLD (hyperlipidemia) 12/16/2015  . BP (high blood pressure) 12/16/2015  . Fatty liver disease, nonalcoholic [id number]  . Apnea, sleep 12/16/2015  . Tubulovillous adenoma polyp of colon 08/19/2015  . Hernia of abdominal cavity 07/31/2015  . Malignant neoplasm of prostate ([place]) 02/20/2014  . Rheumatoid arthritis ([place]) 01/15/2014    Medications:  Current Outpatient Prescriptions on File Prior to Visit  Medication Sig Dispense Refill  . amLODipine-benazepril (LOTREL) 5-20 MG per capsule Take 1 capsule by mouth daily.    Marland Kitchen aspirin EC 81 MG tablet Take 81 mg by mouth daily.    Marland Kitchen atorvastatin (LIPITOR) 80 MG tablet Take 80 mg by mouth at bedtime.     . busPIRone (BUSPAR) 5 MG tablet Take 5 mg by mouth 2 (two) times daily.     . Butalbital-APAP-Caffeine 50-300-40 MG CAPS Take 1 capsule by mouth every 6 (six) hours as needed (for migraines).     . celecoxib (CELEBREX) 200 MG capsule Take 200 mg by mouth daily.     Marland Kitchen CHERATUSSIN AC 100-10 MG/5ML syrup 4 (four) times daily as needed.     . cyclobenzaprine (FLEXERIL) 5 MG tablet Take 5 mg by mouth 3 (three) times daily as needed for muscle spasms.    . fexofenadine (ALLEGRA) 180 MG tablet Take 180 mg by mouth daily.    . fluticasone (FLONASE) 50 MCG/ACT  nasal spray Place 2 sprays into both nostrils daily as needed for rhinitis.     . Fluticasone Furoate-Vilanterol (BREO ELLIPTA) 100-25 MCG/INH AEPB Inhale 1 puff into the lungs daily.     . folic acid (FOLVITE) 1 MG tablet Take 1 mg by mouth daily.    . isosorbide mononitrate (IMDUR) 30 MG 24 hr tablet Take 30 mg by mouth daily.    . pantoprazole (PROTONIX) 40 MG tablet Take 40 mg by mouth daily.    . tamsulosin (FLOMAX) 0.4 MG CAPS capsule Take 1 capsule (0.4 mg total) by mouth daily. 30 capsule 3  . triamcinolone cream (KENALOG) 0.1 % Apply 1 application topically 2 (two) times daily.     No current facility-administered medications on file prior to visit.    Allergies: No Known Allergies  Labs: HEMOGLOBIN A1C- No recent lab on file  Objective: General: Patient is awake, alert, and oriented x 3 and in no acute distress.  Integument: Skin is warm, dry and supple bilateral. Nails are long, thickened and  dystrophic with subungual debris, consistent with onychomycosis, 1-5 bilateral. To right 2nd hammertoe there is reactive keratosis at distal tuft with no signs of infection. Remaining integument unremarkable.  Vasculature:  Dorsalis Pedis pulse 1/4 bilateral. Posterior Tibial pulse  1/4 bilateral.  Capillary fill time <3 sec 1-5 bilateral. No hair growth to the level of the digits. Temperature gradient within  normal limits. Mild varicosities present bilateral. No edema present bilateral. No tenderness to calves.   Neurology: The patient has intact sensation measured with a 5.07/10g Semmes Weinstein Monofilament at all pedal sites bilateral . Vibratory sensation diminished bilateral with tuning fork. No Babinski sign present bilateral.   Musculoskeletal: Hammertoe deformities noted bilateral with right 2nd most contracted with minimal reactive keratosis. Muscular strength 5/5 in all lower extremity muscular groups bilateral.  Assessment and Plan: Problem List Items Addressed This Visit     None    Visit Diagnoses    Dermatophytosis of nail    -  Primary    Hammertoe, unspecified laterality        Foot pain, unspecified laterality        PVD (peripheral vascular disease) ([place])        Diabetic polyneuropathy associated with type 2 diabetes mellitus ([place])          -Examined patient. -Discussed and educated patient on diabetic foot care, especially with  regards to the vascular, neurological and muschloskeletal systems.  -Stressed the importance of good glycemic control and the detriment of not  controlling glucose levels in relation to the foot. -Mechanically debrided all nails 1-5 bilateral using sterile nail nipper without incident  -Continue with crest pad to right 2nd toe and instructed on use -Answered all patient questions -Awaiting Diabetic shoes -Patient to return in 3 months for at risk foot care -Patient advised to call the office if any problems or questions arise in the meantime.  Landis Martins, DPM

## 2015-12-26 NOTE — Progress Notes (Signed)
Measured for diabetic shoes and insoles. 

## 2016-01-16 ENCOUNTER — Encounter: Payer: Self-pay | Admitting: Sports Medicine

## 2016-01-16 ENCOUNTER — Ambulatory Visit (INDEPENDENT_AMBULATORY_CARE_PROVIDER_SITE_OTHER): Payer: Medicare Other | Admitting: Sports Medicine

## 2016-01-16 DIAGNOSIS — E1142 Type 2 diabetes mellitus with diabetic polyneuropathy: Secondary | ICD-10-CM | POA: Diagnosis not present

## 2016-01-16 DIAGNOSIS — I739 Peripheral vascular disease, unspecified: Secondary | ICD-10-CM

## 2016-01-16 DIAGNOSIS — M2041 Other hammer toe(s) (acquired), right foot: Secondary | ICD-10-CM | POA: Diagnosis not present

## 2016-01-16 DIAGNOSIS — M2042 Other hammer toe(s) (acquired), left foot: Secondary | ICD-10-CM

## 2016-01-16 DIAGNOSIS — M2040 Other hammer toe(s) (acquired), unspecified foot: Secondary | ICD-10-CM

## 2016-01-16 DIAGNOSIS — M79673 Pain in unspecified foot: Secondary | ICD-10-CM

## 2016-01-16 NOTE — Patient Instructions (Signed)

## 2016-01-16 NOTE — Progress Notes (Signed)
Patient discussed with medical assistant. Diabetic shoes and inserts dispensed. Patient to follow up as scheduled for continued care or sooner if problems or issues arise. -Dr. Cannon Kettle

## 2016-02-06 DIAGNOSIS — I498 Other specified cardiac arrhythmias: Secondary | ICD-10-CM | POA: Insufficient documentation

## 2016-03-19 ENCOUNTER — Ambulatory Visit: Payer: Medicare Other | Admitting: Podiatry

## 2016-04-08 DIAGNOSIS — I214 Non-ST elevation (NSTEMI) myocardial infarction: Secondary | ICD-10-CM | POA: Insufficient documentation

## 2016-06-03 ENCOUNTER — Ambulatory Visit (INDEPENDENT_AMBULATORY_CARE_PROVIDER_SITE_OTHER): Payer: Medicare Other | Admitting: Podiatry

## 2016-06-03 ENCOUNTER — Encounter: Payer: Self-pay | Admitting: Podiatry

## 2016-06-03 DIAGNOSIS — Q828 Other specified congenital malformations of skin: Secondary | ICD-10-CM

## 2016-06-03 DIAGNOSIS — L851 Acquired keratosis [keratoderma] palmaris et plantaris: Secondary | ICD-10-CM

## 2016-06-03 DIAGNOSIS — E0843 Diabetes mellitus due to underlying condition with diabetic autonomic (poly)neuropathy: Secondary | ICD-10-CM | POA: Diagnosis not present

## 2016-06-03 DIAGNOSIS — B351 Tinea unguium: Secondary | ICD-10-CM

## 2016-06-03 DIAGNOSIS — M79609 Pain in unspecified limb: Secondary | ICD-10-CM

## 2016-06-03 DIAGNOSIS — M79672 Pain in left foot: Secondary | ICD-10-CM

## 2016-06-03 DIAGNOSIS — M79671 Pain in right foot: Secondary | ICD-10-CM

## 2016-06-03 DIAGNOSIS — L84 Corns and callosities: Secondary | ICD-10-CM

## 2016-06-03 DIAGNOSIS — L608 Other nail disorders: Secondary | ICD-10-CM

## 2016-06-03 DIAGNOSIS — L603 Nail dystrophy: Secondary | ICD-10-CM

## 2016-06-06 NOTE — Progress Notes (Signed)
SUBJECTIVE Patient with a history of diabetes mellitus presents to office today complaining of elongated, thickened nails. Pain while ambulating in shoes. Patient is unable to trim their own nails.  Patient also complains of painful callus lesion that is affecting her ability to walk without pain.  No Known Allergies  OBJECTIVE General Patient is awake, alert, and oriented x 3 and in no acute distress. Derm painful hyperkeratotic callus lesion was also noted to the lower extremity. Skin is dry and supple bilateral. Negative open lesions or macerations. Remaining integument unremarkable. Nails are tender, long, thickened and dystrophic with subungual debris, consistent with onychomycosis, 1-5 bilateral. No signs of infection noted. Vasc  DP and PT pedal pulses palpable bilaterally. Temperature gradient within normal limits.  Neuro Epicritic and protective threshold sensation diminished bilaterally.  Musculoskeletal Exam No symptomatic pedal deformities noted bilateral. Muscular strength within normal limits.  ASSESSMENT 1. Diabetes Mellitus w/ peripheral neuropathy 2. Onychomycosis of nail due to dermatophyte bilateral 3. Pain in foot bilateral 4. Porokeratosis left foot  PLAN OF CARE 1. Patient evaluated today. 2. Instructed to maintain good pedal hygiene and foot care. Stressed importance of controlling blood sugar.  3. Mechanical debridement of nails 1-5 bilaterally performed using a nail nipper. Filed with dremel without incident.  4. Excisional debridement of painful callus lesion was noted using a chisel blade without incident or bleeding. 5. Return to clinic in 3 mos.     Edrick Kins, DPM [facility]  Dr. Edrick Kins, [address]                                        [place], [place] [address]                Office [phone]  Fax [phone]

## 2016-07-22 ENCOUNTER — Other Ambulatory Visit: Payer: Self-pay | Admitting: Nephrology

## 2016-07-22 DIAGNOSIS — Q613 Polycystic kidney, unspecified: Secondary | ICD-10-CM

## 2016-07-27 ENCOUNTER — Ambulatory Visit
Admission: RE | Admit: 2016-07-27 | Discharge: 2016-07-27 | Disposition: A | Discharge status: Home / Self Care | Payer: Medicare Other | Source: Ambulatory Visit | Attending: Nephrology | Admitting: Nephrology

## 2016-07-27 DIAGNOSIS — N281 Cyst of kidney, acquired: Secondary | ICD-10-CM | POA: Insufficient documentation

## 2016-07-27 DIAGNOSIS — Q613 Polycystic kidney, unspecified: Secondary | ICD-10-CM | POA: Insufficient documentation

## 2016-07-27 DIAGNOSIS — N200 Calculus of kidney: Secondary | ICD-10-CM | POA: Insufficient documentation

## 2016-07-27 DIAGNOSIS — K4090 Unilateral inguinal hernia, without obstruction or gangrene, not specified as recurrent: Secondary | ICD-10-CM | POA: Insufficient documentation

## 2016-07-27 DIAGNOSIS — Z95828 Presence of other vascular implants and grafts: Secondary | ICD-10-CM | POA: Insufficient documentation

## 2016-07-27 DIAGNOSIS — I714 Abdominal aortic aneurysm, without rupture: Secondary | ICD-10-CM | POA: Diagnosis not present

## 2016-07-27 DIAGNOSIS — Z9049 Acquired absence of other specified parts of digestive tract: Secondary | ICD-10-CM | POA: Diagnosis not present

## 2016-07-27 DIAGNOSIS — Z9889 Other specified postprocedural states: Secondary | ICD-10-CM | POA: Insufficient documentation

## 2016-08-12 ENCOUNTER — Encounter: Payer: Self-pay | Admitting: *Deleted

## 2016-08-18 ENCOUNTER — Ambulatory Visit (INDEPENDENT_AMBULATORY_CARE_PROVIDER_SITE_OTHER): Payer: Medicare Other | Admitting: General Surgery

## 2016-08-18 ENCOUNTER — Encounter: Payer: Self-pay | Admitting: General Surgery

## 2016-08-18 VITALS — BP 130/72 | HR 50 | Resp 18 | Ht 71.0 in | Wt 234.0 lb

## 2016-08-18 DIAGNOSIS — D126 Benign neoplasm of colon, unspecified: Secondary | ICD-10-CM | POA: Diagnosis not present

## 2016-08-18 NOTE — Progress Notes (Signed)
Patient ID: Omar [name], male   DOB: 12-May-1941, 76 y.o.   MRN: [id number]  Chief Complaint  Patient presents with  . Colon Polyps    HPI Omar [name] is a 76 y.o. male here today to discuss colonoscopy. Patient last colonoscopy  was done on 07/07/2015. He does admit to diarrhea about once a week lasting about 1-2 days and he thinks it is associated with milk. He thought he saw some blood in stool last week but wasn't sure. He does admit to occasional rectal itching. He did notice some shortness of breath while cleaning the bathroom last week. He is followed by Dr Neoma Laming from cardiology.  HPI  Past Medical History:  Diagnosis Date  . Anxiety   . Arthritis    rheumatoid  . Atrial fibrillation (HCC)    hx of  . Colon polyp 07-07-15   TUBULAR ADENOMA WITH AT LEAST HIGH-GRADE / Dr Rayann Heman  . Diabetes mellitus without complication ([place])   . GERD (gastroesophageal reflux disease)   . Heart murmur   . Hypercholesterolemia   . Hypertension   . Prostate cancer ([place]) 01/01/13, 01/30/14   Gleason 3+4=7, volume 46.6 cc  . Rheumatoid arthritis ([place])   . S/P radiation therapy  04/03/2014 through 06/04/2014                                                      Prostate 7800 cGy in 40 sessions                          . Sleep apnea     Past Surgical History:  Procedure Laterality Date  . ABDOMINAL AORTIC ANEURYSM REPAIR  11/2013  . COLON SURGERY  March 2017   Right hemicolectomy for tubulovillous adenoma with high-grade dysplasia.  . COLONOSCOPY WITH PROPOFOL N/A 07/07/2015   Procedure: COLONOSCOPY WITH PROPOFOL;  Surgeon: Josefine Class, MD;  Location[facility] ENDOSCOPY;  Service: Endoscopy;  Laterality: N/A;  . LAPAROSCOPIC RIGHT COLECTOMY Right 08/08/2015   Procedure: LAPAROSCOPIC RIGHT COLECTOMY;  Surgeon: Robert Bellow, MD;  Location: [facility] ORS;  Service: General;  Laterality: Right;  . NASAL SINUS SURGERY    . PROSTATE BIOPSY  01/01/13, 01/30/14   Gleason 3+3=6, vol 46.6 cc   . TONSILLECTOMY    . uvula surgery     for sleep apnea    Family History  Problem Relation Age of Onset  . Heart attack Mother   . Cirrhosis Father   . Cancer Brother     pancreatic  . Diabetes Brother   . Diabetes Daughter     medication induced for cancer treatments  . Cancer Daughter     breast, brain    Social History Social History  Substance Use Topics  . Smoking status: Current Every Day Smoker    Packs/day: 2.00    Years: 50.00    Types: Cigarettes  . Smokeless tobacco: Never Used  . Alcohol use 0.6 oz/week    1 Cans of beer per week     Comment: occasionally    No Known Allergies  Current Outpatient Prescriptions  Medication Sig Dispense Refill  . amLODipine-benazepril (LOTREL) 5-20 MG per capsule Take 1 capsule by mouth daily.    Marland Kitchen aspirin EC 81 MG tablet Take 81 mg by mouth  daily.    . atorvastatin (LIPITOR) 80 MG tablet Take 80 mg by mouth at bedtime.     . busPIRone (BUSPAR) 5 MG tablet Take 5 mg by mouth 2 (two) times daily.     . Butalbital-APAP-Caffeine 50-300-40 MG CAPS Take 1 capsule by mouth every 6 (six) hours as needed (for migraines).     . celecoxib (CELEBREX) 200 MG capsule Take 200 mg by mouth daily.     Marland Kitchen CHERATUSSIN AC 100-10 MG/5ML syrup 4 (four) times daily as needed.     . cyclobenzaprine (FLEXERIL) 5 MG tablet Take 5 mg by mouth 3 (three) times daily as needed for muscle spasms.    . fexofenadine (ALLEGRA) 180 MG tablet Take 180 mg by mouth daily.    . fluticasone (FLONASE) 50 MCG/ACT nasal spray Place 2 sprays into both nostrils daily as needed for rhinitis.     . Fluticasone Furoate-Vilanterol (BREO ELLIPTA) 100-25 MCG/INH AEPB Inhale 1 puff into the lungs daily.     . folic acid (FOLVITE) 1 MG tablet Take 1 mg by mouth daily.    . isosorbide mononitrate (IMDUR) 30 MG 24 hr tablet Take 30 mg by mouth daily.    . pantoprazole (PROTONIX) 40 MG tablet Take 40 mg by mouth daily.    . tamsulosin (FLOMAX) 0.4 MG CAPS capsule Take 1 capsule  (0.4 mg total) by mouth daily. 30 capsule 3  . triamcinolone cream (KENALOG) 0.1 % Apply 1 application topically 2 (two) times daily.     No current facility-administered medications for this visit.     Review of Systems Review of Systems  Constitutional: Negative.   Respiratory: Positive for shortness of breath.   Cardiovascular: Negative.   Gastrointestinal: Positive for diarrhea. Negative for abdominal pain.    Blood pressure 130/72, pulse (!) 50, resp. rate 18, height 5\' 11"  (1.803 m), weight 234 lb (106.1 kg), SpO2 96 %.  Physical Exam Physical Exam  Constitutional: He is oriented to person, place, and time. He appears well-developed and well-nourished.  HENT:  Mouth/Throat: Oropharynx is clear and moist.  Eyes: Conjunctivae are normal. No scleral icterus.  Neck: Neck supple.  Cardiovascular: Normal rate, regular rhythm and normal heart sounds.   Pulmonary/Chest: Effort normal and breath sounds normal.  Abdominal: Soft. There is no tenderness.    Lymphadenopathy:    He has no cervical adenopathy.  Neurological: He is alert and oriented to person, place, and time.  Skin: Skin is warm and dry.    Data Reviewed March 2017: DIAGNOSIS:  A. RIGHT COLON; RIGHT COLECTOMY:  - TUBULOVILLOUS ADENOMA WITH FOCAL HIGH-GRADE DYSPLASIA (2.7 CM).  - TUBULAR ADENOMA (1.4 CM).  - LIPOMATOUS ILEOCECAL VALVE.  - UNREMARKABLE APPENDIX.  - THE MARGINS OF RESECTION ARE NEGATIVE.  - NO TUMOR SEEN IN NINETEEN LYMPH NODES (0/19).   Assessment    Candidate for follow-up colonoscopy based on high-grade dysplasia.    Plan    Increased shortness of breath with exertion of late. Patient to follow-up tomorrow with cardiology.    Recommend using wet wipes after BM to help with itching.  Cardiac clearance by Dr. Neoma Laming needed. Patient was not scheduled for a routine follow up until May 2018. Due to his increased shortness of breath, our office made arrangements for the patient to be  seen tomorrow, 08-19-16 at 2:30 pm.  Colonoscopy with possible biopsy/polypectomy prn: Information regarding the procedure, including its potential risks and complications (including but not limited to perforation of the bowel, which  may require emergency surgery to repair, and bleeding) was verbally given to the patient. Educational information regarding lower intestinal endoscopy was given to the patient. Written instructions for how to complete the bowel prep using Miralax were provided. The importance of drinking ample fluids to avoid dehydration as a result of the prep emphasized.  Patient wishes to call the office back to arrange a date for colonoscopy. This will need to be scheduled a few weeks out to make sure we will have cardiac clearance. Colonoscopy instructions were provided to the patient today. Miralax prescription will be sent in once date is arranged. Patient also reports he is taking Metformin. This is not listed in his medication history. When patient calls back to arrange colonoscopy, we need to clarify dosage and add to medication list. Patient has been asked to hold metformin day of colonoscopy prep and procedure.    This information has been scribed by Karie Fetch RN, BSN,BC.    Robert Bellow 08/18/2016, 8:52 PM

## 2016-08-18 NOTE — Patient Instructions (Addendum)
The patient is aware to call back for any questions or concerns. Cardiac clearance by Dr Humphrey Rolls needed.  Recommend using wet wipes after BM to help with itching.   Colonoscopy, Adult A colonoscopy is an exam to look at the entire large intestine. During the exam, a lubricated, bendable tube is inserted into the anus and then passed into the rectum, colon, and other parts of the large intestine. A colonoscopy is often done as a part of normal colorectal screening or in response to certain symptoms, such as anemia, persistent diarrhea, abdominal pain, and blood in the stool. The exam can help screen for and diagnose medical problems, including:  Tumors.  Polyps.  Inflammation.  Areas of bleeding. Tell a health care provider about:  Any allergies you have.  All medicines you are taking, including vitamins, herbs, eye drops, creams, and over-the-counter medicines.  Any problems you or family members have had with anesthetic medicines.  Any blood disorders you have.  Any surgeries you have had.  Any medical conditions you have.  Any problems you have had passing stool. What are the risks? Generally, this is a safe procedure. However, problems may occur, including:  Bleeding.  A tear in the intestine.  A reaction to medicines given during the exam.  Infection (rare). What happens before the procedure? Eating and drinking restrictions  Follow instructions from your health care provider about eating and drinking, which may include:  A few days before the procedure - follow a low-fiber diet. Avoid nuts, seeds, dried fruit, raw fruits, and vegetables.  1-3 days before the procedure - follow a clear liquid diet. Drink only clear liquids, such as clear broth or bouillon, black coffee or tea, clear juice, clear soft drinks or sports drinks, gelatin dessert, and popsicles. Avoid any liquids that contain red or purple dye.  On the day of the procedure - do not eat or drink anything  during the 2 hours before the procedure, or within the time period that your health care provider recommends. Bowel prep  If you were prescribed an oral bowel prep to clean out your colon:  Take it as told by your health care provider. Starting the day before your procedure, you will need to drink a large amount of medicated liquid. The liquid will cause you to have multiple loose stools until your stool is almost clear or light green.  If your skin or anus gets irritated from diarrhea, you may use these to relieve the irritation:  Medicated wipes, such as adult wet wipes with aloe and vitamin E.  A skin soothing-product like petroleum jelly.  If you vomit while drinking the bowel prep, take a break for up to 60 minutes and then begin the bowel prep again. If vomiting continues and you cannot take the bowel prep without vomiting, call your health care provider. General instructions   Ask your health care provider about changing or stopping your regular medicines. This is especially important if you are taking diabetes medicines or blood thinners.  Plan to have someone take you home from the hospital or clinic. What happens during the procedure?  An IV tube may be inserted into one of your veins.  You will be given medicine to help you relax (sedative).  To reduce your risk of infection:  Your health care team will wash or sanitize their hands.  Your anal area will be washed with soap.  You will be asked to lie on your side with your knees bent.  Your  health care provider will lubricate a long, thin, flexible tube. The tube will have a camera and a light on the end.  The tube will be inserted into your anus.  The tube will be gently eased through your rectum and colon.  Air will be delivered into your colon to keep it open. You may feel some pressure or cramping.  The camera will be used to take images during the procedure.  A small tissue sample may be removed from your body  to be examined under a microscope (biopsy). If any potential problems are found, the tissue will be sent to a lab for testing.  If small polyps are found, your health care provider may remove them and have them checked for cancer cells.  The tube that was inserted into your anus will be slowly removed. The procedure may vary among health care providers and hospitals. What happens after the procedure?  Your blood pressure, heart rate, breathing rate, and blood oxygen level will be monitored until the medicines you were given have worn off.  Do not drive for 24 hours after the exam.  You may have a small amount of blood in your stool.  You may pass gas and have mild abdominal cramping or bloating due to the air that was used to inflate your colon during the exam.  It is up to you to get the results of your procedure. Ask your health care provider, or the department performing the procedure, when your results will be ready. This information is not intended to replace advice given to you by your health care provider. Make sure you discuss any questions you have with your health care provider. Document Released: 05/21/2000 Document Revised: 03/24/2016 Document Reviewed: 08/05/2015 Elsevier Interactive Patient Education  2017 [employer].

## 2016-08-21 ENCOUNTER — Inpatient Hospital Stay
Admission: EM | Admit: 2016-08-21 | Discharge: 2016-08-27 | DRG: 242 | Disposition: A | Discharge status: Home Health | Payer: Medicare Other | Attending: Internal Medicine | Admitting: Internal Medicine

## 2016-08-21 ENCOUNTER — Emergency Department: Payer: Medicare Other

## 2016-08-21 DIAGNOSIS — K219 Gastro-esophageal reflux disease without esophagitis: Secondary | ICD-10-CM | POA: Diagnosis present

## 2016-08-21 DIAGNOSIS — I130 Hypertensive heart and chronic kidney disease with heart failure and stage 1 through stage 4 chronic kidney disease, or unspecified chronic kidney disease: Principal | ICD-10-CM | POA: Diagnosis present

## 2016-08-21 DIAGNOSIS — R0602 Shortness of breath: Secondary | ICD-10-CM | POA: Diagnosis present

## 2016-08-21 DIAGNOSIS — F17210 Nicotine dependence, cigarettes, uncomplicated: Secondary | ICD-10-CM | POA: Diagnosis present

## 2016-08-21 DIAGNOSIS — Z8679 Personal history of other diseases of the circulatory system: Secondary | ICD-10-CM

## 2016-08-21 DIAGNOSIS — K627 Radiation proctitis: Secondary | ICD-10-CM | POA: Diagnosis present

## 2016-08-21 DIAGNOSIS — Z950 Presence of cardiac pacemaker: Secondary | ICD-10-CM

## 2016-08-21 DIAGNOSIS — Z79899 Other long term (current) drug therapy: Secondary | ICD-10-CM

## 2016-08-21 DIAGNOSIS — Z7982 Long term (current) use of aspirin: Secondary | ICD-10-CM

## 2016-08-21 DIAGNOSIS — Z923 Personal history of irradiation: Secondary | ICD-10-CM

## 2016-08-21 DIAGNOSIS — Z833 Family history of diabetes mellitus: Secondary | ICD-10-CM | POA: Diagnosis not present

## 2016-08-21 DIAGNOSIS — I4891 Unspecified atrial fibrillation: Secondary | ICD-10-CM | POA: Diagnosis present

## 2016-08-21 DIAGNOSIS — M2550 Pain in unspecified joint: Secondary | ICD-10-CM

## 2016-08-21 DIAGNOSIS — J9601 Acute respiratory failure with hypoxia: Secondary | ICD-10-CM

## 2016-08-21 DIAGNOSIS — E7800 Pure hypercholesterolemia, unspecified: Secondary | ICD-10-CM | POA: Diagnosis present

## 2016-08-21 DIAGNOSIS — Z95828 Presence of other vascular implants and grafts: Secondary | ICD-10-CM | POA: Diagnosis not present

## 2016-08-21 DIAGNOSIS — E1122 Type 2 diabetes mellitus with diabetic chronic kidney disease: Secondary | ICD-10-CM | POA: Diagnosis present

## 2016-08-21 DIAGNOSIS — J449 Chronic obstructive pulmonary disease, unspecified: Secondary | ICD-10-CM | POA: Diagnosis present

## 2016-08-21 DIAGNOSIS — D72829 Elevated white blood cell count, unspecified: Secondary | ICD-10-CM | POA: Diagnosis present

## 2016-08-21 DIAGNOSIS — I071 Rheumatic tricuspid insufficiency: Secondary | ICD-10-CM | POA: Diagnosis present

## 2016-08-21 DIAGNOSIS — E785 Hyperlipidemia, unspecified: Secondary | ICD-10-CM | POA: Diagnosis present

## 2016-08-21 DIAGNOSIS — Z9111 Patient's noncompliance with dietary regimen: Secondary | ICD-10-CM

## 2016-08-21 DIAGNOSIS — C61 Malignant neoplasm of prostate: Secondary | ICD-10-CM | POA: Diagnosis present

## 2016-08-21 DIAGNOSIS — I442 Atrioventricular block, complete: Secondary | ICD-10-CM | POA: Diagnosis present

## 2016-08-21 DIAGNOSIS — G4733 Obstructive sleep apnea (adult) (pediatric): Secondary | ICD-10-CM | POA: Diagnosis present

## 2016-08-21 DIAGNOSIS — K922 Gastrointestinal hemorrhage, unspecified: Secondary | ICD-10-CM | POA: Diagnosis present

## 2016-08-21 DIAGNOSIS — I5033 Acute on chronic diastolic (congestive) heart failure: Secondary | ICD-10-CM | POA: Diagnosis present

## 2016-08-21 DIAGNOSIS — R109 Unspecified abdominal pain: Secondary | ICD-10-CM

## 2016-08-21 DIAGNOSIS — Z7902 Long term (current) use of antithrombotics/antiplatelets: Secondary | ICD-10-CM

## 2016-08-21 DIAGNOSIS — Z8249 Family history of ischemic heart disease and other diseases of the circulatory system: Secondary | ICD-10-CM

## 2016-08-21 DIAGNOSIS — K921 Melena: Secondary | ICD-10-CM | POA: Diagnosis present

## 2016-08-21 DIAGNOSIS — J810 Acute pulmonary edema: Secondary | ICD-10-CM

## 2016-08-21 DIAGNOSIS — I4430 Unspecified atrioventricular block: Secondary | ICD-10-CM

## 2016-08-21 DIAGNOSIS — D649 Anemia, unspecified: Secondary | ICD-10-CM

## 2016-08-21 DIAGNOSIS — Z7984 Long term (current) use of oral hypoglycemic drugs: Secondary | ICD-10-CM

## 2016-08-21 DIAGNOSIS — M109 Gout, unspecified: Secondary | ICD-10-CM | POA: Diagnosis present

## 2016-08-21 DIAGNOSIS — N289 Disorder of kidney and ureter, unspecified: Secondary | ICD-10-CM

## 2016-08-21 DIAGNOSIS — N189 Chronic kidney disease, unspecified: Secondary | ICD-10-CM

## 2016-08-21 DIAGNOSIS — D62 Acute posthemorrhagic anemia: Secondary | ICD-10-CM | POA: Diagnosis present

## 2016-08-21 DIAGNOSIS — N183 Chronic kidney disease, stage 3 (moderate): Secondary | ICD-10-CM | POA: Diagnosis present

## 2016-08-21 DIAGNOSIS — R0600 Dyspnea, unspecified: Secondary | ICD-10-CM

## 2016-08-21 DIAGNOSIS — R195 Other fecal abnormalities: Secondary | ICD-10-CM

## 2016-08-21 DIAGNOSIS — M069 Rheumatoid arthritis, unspecified: Secondary | ICD-10-CM | POA: Diagnosis present

## 2016-08-21 DIAGNOSIS — R0902 Hypoxemia: Secondary | ICD-10-CM

## 2016-08-21 DIAGNOSIS — I5031 Acute diastolic (congestive) heart failure: Secondary | ICD-10-CM

## 2016-08-21 DIAGNOSIS — D500 Iron deficiency anemia secondary to blood loss (chronic): Secondary | ICD-10-CM

## 2016-08-21 HISTORY — DX: Cardiac arrhythmia, unspecified: I49.9

## 2016-08-21 LAB — BASIC METABOLIC PANEL
ANION GAP: 6 (ref 5–15)
BUN: 16 mg/dL (ref 6–20)
CALCIUM: 8.5 mg/dL — AB (ref 8.9–10.3)
CHLORIDE: 115 mmol/L — AB (ref 101–111)
CO2: 20 mmol/L — AB (ref 22–32)
Creatinine, Ser: 1.21 mg/dL (ref 0.61–1.24)
GFR, EST NON AFRICAN AMERICAN: 57 mL/min — AB (ref >60)
Glucose, Bld: 129 mg/dL — ABNORMAL HIGH (ref 65–99)
Potassium: 3.9 mmol/L (ref 3.5–5.1)
Sodium: 141 mmol/L (ref 135–145)

## 2016-08-21 LAB — CBC WITH DIFFERENTIAL/PLATELET
BASOS ABS: 0.1 10*3/uL (ref 0–0.1)
BASOS PCT: 1 %
Eosinophils Absolute: 0.4 10*3/uL (ref 0–0.7)
Eosinophils Relative: 4 %
HEMATOCRIT: 29.1 % — AB (ref 40.0–52.0)
Hemoglobin: 9 g/dL — ABNORMAL LOW (ref 13.0–18.0)
Lymphocytes Relative: 9 %
Lymphs Abs: 0.9 10*3/uL — ABNORMAL LOW (ref 1.0–3.6)
MCH: 24 pg — ABNORMAL LOW (ref 26.0–34.0)
MCHC: 31.1 g/dL — ABNORMAL LOW (ref 32.0–36.0)
MCV: 77.1 fL — ABNORMAL LOW (ref 80.0–100.0)
Monocytes Absolute: 0.8 10*3/uL (ref 0.2–1.0)
Monocytes Relative: 8 %
NEUTROS ABS: 8.2 10*3/uL — AB (ref 1.4–6.5)
NEUTROS PCT: 78 %
Platelets: 221 10*3/uL (ref 150–440)
RBC: 3.77 MIL/uL — ABNORMAL LOW (ref 4.40–5.90)
RDW: 24.6 % — ABNORMAL HIGH (ref 11.5–14.5)
WBC: 10.3 10*3/uL (ref 3.8–10.6)

## 2016-08-21 LAB — GLUCOSE, CAPILLARY: Glucose-Capillary: 115 mg/dL — ABNORMAL HIGH (ref 65–99)

## 2016-08-21 LAB — BRAIN NATRIURETIC PEPTIDE: B NATRIURETIC PEPTIDE 5: 483 pg/mL — AB (ref 0.0–100.0)

## 2016-08-21 LAB — PREPARE RBC (CROSSMATCH)

## 2016-08-21 LAB — TROPONIN I: Troponin I: <0.03 ng/mL (ref <0.03)

## 2016-08-21 LAB — FERRITIN: Ferritin: 7 ng/mL — ABNORMAL LOW (ref 24–336)

## 2016-08-21 MED ORDER — SODIUM CHLORIDE 0.9 % IV SOLN
80.0000 mg | Freq: Once | INTRAVENOUS | Status: AC
  Administered 2016-08-22: 80 mg via INTRAVENOUS
  Filled 2016-08-21: qty 80

## 2016-08-21 MED ORDER — ATORVASTATIN CALCIUM 20 MG PO TABS
80.0000 mg | ORAL_TABLET | Freq: Every day | ORAL | Status: DC
  Administered 2016-08-22 – 2016-08-26 (×6): 80 mg via ORAL
  Filled 2016-08-21 (×6): qty 4

## 2016-08-21 MED ORDER — SODIUM CHLORIDE 0.9% FLUSH
3.0000 mL | INTRAVENOUS | Status: DC | PRN
  Administered 2016-08-24 (×3): 3 mL via INTRAVENOUS
  Filled 2016-08-21 (×3): qty 3

## 2016-08-21 MED ORDER — ISOSORBIDE MONONITRATE ER 30 MG PO TB24
30.0000 mg | ORAL_TABLET | Freq: Every day | ORAL | Status: DC
  Administered 2016-08-22 – 2016-08-27 (×5): 30 mg via ORAL
  Filled 2016-08-21 (×5): qty 1

## 2016-08-21 MED ORDER — FLUTICASONE FUROATE-VILANTEROL 100-25 MCG/INH IN AEPB
1.0000 | INHALATION_SPRAY | Freq: Every day | RESPIRATORY_TRACT | Status: DC
  Administered 2016-08-23 – 2016-08-27 (×4): 1 via RESPIRATORY_TRACT
  Filled 2016-08-21: qty 28

## 2016-08-21 MED ORDER — BUSPIRONE HCL 5 MG PO TABS
5.0000 mg | ORAL_TABLET | Freq: Two times a day (BID) | ORAL | Status: DC
  Administered 2016-08-22 – 2016-08-27 (×11): 5 mg via ORAL
  Filled 2016-08-21 (×11): qty 1

## 2016-08-21 MED ORDER — FUROSEMIDE 40 MG PO TABS
20.0000 mg | ORAL_TABLET | Freq: Once | ORAL | Status: AC
  Administered 2016-08-21: 20 mg via ORAL
  Filled 2016-08-21: qty 1

## 2016-08-21 MED ORDER — IPRATROPIUM BROMIDE 0.02 % IN SOLN: 0.5000 mg | Freq: Four times a day (QID) | RESPIRATORY_TRACT | Status: DC | PRN

## 2016-08-21 MED ORDER — ONDANSETRON HCL 4 MG PO TABS: 4.0000 mg | ORAL_TABLET | Freq: Four times a day (QID) | ORAL | Status: DC | PRN

## 2016-08-21 MED ORDER — FOLIC ACID 1 MG PO TABS
1.0000 mg | ORAL_TABLET | Freq: Every day | ORAL | Status: DC
  Administered 2016-08-22 – 2016-08-27 (×5): 1 mg via ORAL
  Filled 2016-08-21 (×5): qty 1

## 2016-08-21 MED ORDER — ALBUTEROL SULFATE (2.5 MG/3ML) 0.083% IN NEBU
2.5000 mg | INHALATION_SOLUTION | Freq: Four times a day (QID) | RESPIRATORY_TRACT | Status: DC | PRN
  Administered 2016-08-22 – 2016-08-26 (×3): 2.5 mg via RESPIRATORY_TRACT
  Filled 2016-08-21 (×3): qty 3

## 2016-08-21 MED ORDER — INSULIN ASPART 100 UNIT/ML ~~LOC~~ SOLN
0.0000 [IU] | SUBCUTANEOUS | Status: DC
  Administered 2016-08-25 – 2016-08-27 (×2): 3 [IU] via SUBCUTANEOUS
  Filled 2016-08-21 (×3): qty 3

## 2016-08-21 MED ORDER — ACETAMINOPHEN 325 MG PO TABS: 650.0000 mg | ORAL_TABLET | Freq: Four times a day (QID) | ORAL | Status: DC | PRN

## 2016-08-21 MED ORDER — SODIUM CHLORIDE 0.9% FLUSH: 3.0000 mL | Freq: Two times a day (BID) | INTRAVENOUS | Status: DC

## 2016-08-21 MED ORDER — OXYCODONE HCL 5 MG PO TABS
5.0000 mg | ORAL_TABLET | ORAL | Status: DC | PRN
  Administered 2016-08-22: 5 mg via ORAL
  Filled 2016-08-21: qty 1

## 2016-08-21 MED ORDER — AMLODIPINE BESYLATE 5 MG PO TABS: 5.0000 mg | ORAL_TABLET | Freq: Every day | ORAL | Status: DC

## 2016-08-21 MED ORDER — BENAZEPRIL HCL 20 MG PO TABS
20.0000 mg | ORAL_TABLET | Freq: Every day | ORAL | Status: DC
  Administered 2016-08-22 – 2016-08-24 (×3): 20 mg via ORAL
  Filled 2016-08-21 (×3): qty 1

## 2016-08-21 MED ORDER — SODIUM CHLORIDE 0.9 % IV SOLN: 10.0000 mL/h | Freq: Once | INTRAVENOUS | Status: DC

## 2016-08-21 MED ORDER — AMLODIPINE BESY-BENAZEPRIL HCL 5-20 MG PO CAPS: 1.0000 | ORAL_CAPSULE | Freq: Every day | ORAL | Status: DC

## 2016-08-21 MED ORDER — GABAPENTIN 100 MG PO CAPS
100.0000 mg | ORAL_CAPSULE | Freq: Every day | ORAL | Status: DC
  Administered 2016-08-22 – 2016-08-26 (×6): 100 mg via ORAL
  Filled 2016-08-21 (×7): qty 1

## 2016-08-21 MED ORDER — ONDANSETRON HCL 4 MG/2ML IJ SOLN: 4.0000 mg | Freq: Four times a day (QID) | INTRAMUSCULAR | Status: DC | PRN

## 2016-08-21 MED ORDER — ALPRAZOLAM 0.25 MG PO TABS
0.2500 mg | ORAL_TABLET | Freq: Two times a day (BID) | ORAL | Status: DC
  Administered 2016-08-22 – 2016-08-27 (×11): 0.25 mg via ORAL
  Filled 2016-08-21 (×12): qty 1

## 2016-08-21 MED ORDER — LORATADINE 10 MG PO TABS
10.0000 mg | ORAL_TABLET | Freq: Every day | ORAL | Status: DC
  Administered 2016-08-22 – 2016-08-27 (×5): 10 mg via ORAL
  Filled 2016-08-21 (×5): qty 1

## 2016-08-21 MED ORDER — SODIUM CHLORIDE 0.9% FLUSH
3.0000 mL | Freq: Two times a day (BID) | INTRAVENOUS | Status: DC
  Administered 2016-08-22 – 2016-08-27 (×8): 3 mL via INTRAVENOUS

## 2016-08-21 MED ORDER — PANTOPRAZOLE SODIUM 40 MG IV SOLR: 40.0000 mg | Freq: Two times a day (BID) | INTRAVENOUS | Status: DC

## 2016-08-21 MED ORDER — FLUTICASONE PROPIONATE 50 MCG/ACT NA SUSP
2.0000 | Freq: Every day | NASAL | Status: DC | PRN
  Administered 2016-08-24: 2 via NASAL
  Filled 2016-08-21: qty 16

## 2016-08-21 MED ORDER — ACETAMINOPHEN 650 MG RE SUPP: 650.0000 mg | Freq: Four times a day (QID) | RECTAL | Status: DC | PRN

## 2016-08-21 MED ORDER — SODIUM CHLORIDE 0.9 % IV SOLN: 250.0000 mL | INTRAVENOUS | Status: DC | PRN

## 2016-08-21 NOTE — ED Notes (Signed)
Report from angela, rn.  

## 2016-08-21 NOTE — H&P (Signed)
History and Physical   [facility] [place] @ [facility] [address] Admission History and Physical [employer], D.O.    Patient Name: Omar [name] MR#: [id number] Date of Birth: Dec 06, 1940 Date of Admission: 08/21/2016  Referring MD/NP/PA: Dr. Jimmye Norman Primary Care Physician: Volanda Napoleon, MD Patient coming from: Home Outpatient Specialists: Podiatry, Rheum, Surgery   Chief Complaint:  Chief Complaint  Patient presents with  . Shortness of Breath    HPI: Omar [name] is a 76 y.o. male with a known history of anxiety, rheumatoid arthritis, atrial fibrillation, diabetes, GERD, hyperlipidemia, hypertension, prostate cancer status post radiation, obstructive sleep apnea presents to the emergency department for evaluation of SOB.  Patient was in a usual state of health until yesterday when he developed sudden worsening of his chronic mild shortness of breath.  Notes black stools and bright red blood recently, but cannot remember when it started. Also complains of lower extremity edema starting today.    Of note patient January of 2017 patient had bright red blood, had colonoscopy and was found to have adenomas. Now follows with general surgery for tubulovillous adenomas.  Otherwise there has been no change in status. Patient has been taking medication as prescribed and there has been no recent change in medication or diet.  No recent antibiotics.  There has been no recent illness, hospitalizations, travel or sick contacts.    Patient denies fevers/chills, weakness, dizziness, chest pain, N/V/C/D, abdominal pain, dysuria/frequency, changes in mental status.   ED Course: Patient received Lasix 20mg  IV once.  Review of Systems:  CONSTITUTIONAL: No fever/chills, fatigue, weakness, weight gain/loss, headache. EYES: No blurry or double vision. ENT: No tinnitus, postnasal drip, redness or soreness of the oropharynx. RESPIRATORY: No cough, wheeze.  No hemoptysis. Positive  dyspnea CARDIOVASCULAR: No chest pain, palpitations, syncope, orthopnea. Positive lower extremity edema.  GASTROINTESTINAL: No nausea, vomiting, abdominal pain, diarrhea, constipation.  No hematemesis, Positive melena and hematochezia. GENITOURINARY: No dysuria, frequency, hematuria. ENDOCRINE: No polyuria or nocturia. No heat or cold intolerance. HEMATOLOGY: No anemia, bruising, bleeding. INTEGUMENTARY: No rashes, ulcers, lesions. MUSCULOSKELETAL: No arthritis, gout, dyspnea. NEUROLOGIC: No numbness, tingling, ataxia, seizure-type activity, weakness. PSYCHIATRIC: No anxiety, depression, insomnia.   Past Medical History:  Diagnosis Date  . Anxiety   . Arthritis    rheumatoid  . Atrial fibrillation (HCC)    hx of  . Colon polyp 07-07-15   TUBULAR ADENOMA WITH AT LEAST HIGH-GRADE / Dr Rayann Heman  . Diabetes mellitus without complication ([place])   . GERD (gastroesophageal reflux disease)   . Heart murmur   . Hypercholesterolemia   . Hypertension   . Prostate cancer ([place]) 01/01/13, 01/30/14   Gleason 3+4=7, volume 46.6 cc  . Rheumatoid arthritis ([place])   . S/P radiation therapy  04/03/2014 through 06/04/2014                                                      Prostate 7800 cGy in 40 sessions                          . Sleep apnea     Past Surgical History:  Procedure Laterality Date  . ABDOMINAL AORTIC ANEURYSM REPAIR  11/2013  . COLON SURGERY  March 2017   Right hemicolectomy for tubulovillous adenoma with high-grade dysplasia.  . COLONOSCOPY  WITH PROPOFOL N/A 07/07/2015   Procedure: COLONOSCOPY WITH PROPOFOL;  Surgeon: Josefine Class, MD;  Location[facility] ENDOSCOPY;  Service: Endoscopy;  Laterality: N/A;  . LAPAROSCOPIC RIGHT COLECTOMY Right 08/08/2015   Procedure: LAPAROSCOPIC RIGHT COLECTOMY;  Surgeon: Robert Bellow, MD;  Location: [facility] ORS;  Service: General;  Laterality: Right;  . NASAL SINUS SURGERY    . PROSTATE BIOPSY  01/01/13, 01/30/14   Gleason 3+3=6, vol 46.6 cc  .  TONSILLECTOMY    . uvula surgery     for sleep apnea     reports that he has been smoking Cigarettes.  He has a 100.00 pack-year smoking history. He has never used smokeless tobacco. He reports that he drinks about 0.6 oz of alcohol per week . He reports that he does not use drugs.  No Known Allergies  Family History  Problem Relation Age of Onset  . Heart attack Mother   . Cirrhosis Father   . Cancer Brother     pancreatic  . Diabetes Brother   . Diabetes Daughter     medication induced for cancer treatments  . Cancer Daughter     breast, brain   Family history has been reviewed and confirmed with patient.   Prior to Admission medications   Medication Sig Start Date End Date Taking? Authorizing Provider  ALPRAZolam (XANAX) 0.25 MG tablet Take 0.25 mg by mouth 2 (two) times daily.   Yes Historical Provider, MD  amLODipine-benazepril (LOTREL) 5-20 MG per capsule Take 1 capsule by mouth daily.   Yes Historical Provider, MD  aspirin EC 81 MG tablet Take 81 mg by mouth daily.   Yes Historical Provider, MD  atorvastatin (LIPITOR) 80 MG tablet Take 80 mg by mouth at bedtime.    Yes Historical Provider, MD  busPIRone (BUSPAR) 5 MG tablet Take 5 mg by mouth 2 (two) times daily.    Yes Historical Provider, MD  Butalbital-APAP-Caffeine 50-300-40 MG CAPS Take 1 capsule by mouth every 6 (six) hours as needed (for migraines).    Yes Historical Provider, MD  celecoxib (CELEBREX) 200 MG capsule Take 200 mg by mouth daily.    Yes Historical Provider, MD  cetirizine (ZYRTEC) 10 MG tablet Take 10 mg by mouth daily.   Yes Historical Provider, MD  clopidogrel (PLAVIX) 75 MG tablet Take 75 mg by mouth daily.   Yes Historical Provider, MD  diphenoxylate-atropine (LOMOTIL) 2.5-0.025 MG tablet Take 1 tablet by mouth 4 (four) times daily as needed for diarrhea or loose stools.   Yes Historical Provider, MD  fluticasone (FLONASE) 50 MCG/ACT nasal spray Place 2 sprays into both nostrils daily as needed for  rhinitis.    Yes Historical Provider, MD  Fluticasone Furoate-Vilanterol (BREO ELLIPTA) 100-25 MCG/INH AEPB Inhale 1 puff into the lungs daily.    Yes Historical Provider, MD  folic acid (FOLVITE) 1 MG tablet Take 1 mg by mouth daily.   Yes Historical Provider, MD  gabapentin (NEURONTIN) 100 MG capsule Take 100 mg by mouth at bedtime.   Yes Historical Provider, MD  isosorbide mononitrate (IMDUR) 30 MG 24 hr tablet Take 30 mg by mouth daily.   Yes Historical Provider, MD  metFORMIN (GLUCOPHAGE) 500 MG tablet Take 500 mg by mouth 2 (two) times daily with a meal.   Yes Historical Provider, MD  pantoprazole (PROTONIX) 40 MG tablet Take 40 mg by mouth every other day.    Yes Historical Provider, MD  triamcinolone cream (KENALOG) 0.1 % Apply 1 application topically 2 (two)  times daily.   Yes Historical Provider, MD  CHERATUSSIN AC 100-10 MG/5ML syrup 4 (four) times daily as needed.  11/11/15   Historical Provider, MD  cyclobenzaprine (FLEXERIL) 5 MG tablet Take 5 mg by mouth 3 (three) times daily as needed for muscle spasms.    Historical Provider, MD  fexofenadine (ALLEGRA) 180 MG tablet Take 180 mg by mouth daily.    Historical Provider, MD  tamsulosin (FLOMAX) 0.4 MG CAPS capsule Take 1 capsule (0.4 mg total) by mouth daily. Patient not taking: Reported on 08/21/2016 06/03/14   Arloa Koh, MD    Physical Exam: Vitals:   08/21/16 1912 08/21/16 1915 08/21/16 1930 08/21/16 1945  BP:   (!) 147/65 136/67  Pulse: (!) 58 60 (!) 59 60  Resp:      Temp:      TempSrc:      SpO2: 94% (!) 88% 98% 98%  Weight:      Height:        GENERAL: 76 y.o.-year-old male patient, well-developed, well-nourished lying in the bed in no acute distress.  Pleasant and cooperative.   HEENT: Head atraumatic, normocephalic. Pupils equal, round, reactive to light and accommodation. No scleral icterus. Extraocular muscles intact. Nares are patent. Oropharynx is clear. Mucus membranes moist. NECK: Supple, full range of  motion. No JVD, no bruit heard. No thyroid enlargement, no tenderness, no cervical lymphadenopathy. CHEST: Bibasilar crackles.  Short of breath with speaking full sentences. No use of accessory muscles of respiration.  No reproducible chest wall tenderness.  CARDIOVASCULAR: S1, S2 normal. No murmurs, rubs, or gallops. Cap refill <2 seconds. Pulses intact distally.  ABDOMEN: Soft, nondistended, nontender. No rebound, guarding, rigidity. Normoactive bowel sounds present in all four quadrants. No organomegaly or mass.  EXTREMITIES: Pitting edema bilateral lower extremities to mid-calf. . No calf tenderness or Homan's sign.  NEUROLOGIC: The patient is alert and oriented x 3. Cranial nerves II through XII are grossly intact with no focal sensorimotor deficit. Muscle strength 5/5 in all extremities. Sensation intact. Gait not checked. PSYCHIATRIC:  Normal affect, mood, thought content. SKIN: Warm, dry, and intact without obvious rash, lesion, or ulcer.    Labs on Admission:  CBC:  Recent Labs Lab 08/21/16 1912  WBC 10.3  NEUTROABS 8.2*  HGB 9.0*  HCT 29.1*  MCV 77.1*  PLT 706   Basic Metabolic Panel:  Recent Labs Lab 08/21/16 1912  NA 141  K 3.9  CL 115*  CO2 20*  GLUCOSE 129*  BUN 16  CREATININE 1.21  CALCIUM 8.5*   GFR: Estimated Creatinine Clearance: 66.4 mL/min (by C-G formula based on SCr of 1.21 mg/dL). Liver Function Tests: No results for input(s): AST, ALT, ALKPHOS, BILITOT, PROT, ALBUMIN in the last 168 hours. No results for input(s): LIPASE, AMYLASE in the last 168 hours. No results for input(s): AMMONIA in the last 168 hours. Coagulation Profile: No results for input(s): INR, PROTIME in the last 168 hours. Cardiac Enzymes:  Recent Labs Lab 08/21/16 1912  TROPONINI <0.03   BNP (last 3 results) No results for input(s): PROBNP in the last 8760 hours. HbA1C: No results for input(s): HGBA1C in the last 72 hours. CBG: No results for input(s): GLUCAP in the  last 168 hours. Lipid Profile: No results for input(s): CHOL, HDL, LDLCALC, TRIG, CHOLHDL, LDLDIRECT in the last 72 hours. Thyroid Function Tests: No results for input(s): TSH, T4TOTAL, FREET4, T3FREE, THYROIDAB in the last 72 hours. Anemia Panel: No results for input(s): VITAMINB12, FOLATE, FERRITIN, TIBC, IRON, RETICCTPCT  in the last 72 hours. Urine analysis:    Component Value Date/Time   COLORURINE Yellow 10/31/2013 1620   APPEARANCEUR Clear 10/31/2013 1620   LABSPEC 1.015 10/31/2013 1620   PHURINE 5.0 10/31/2013 1620   GLUCOSEU Negative 10/31/2013 1620   HGBUR Negative 10/31/2013 1620   BILIRUBINUR Negative 10/31/2013 1620   KETONESUR Negative 10/31/2013 1620   PROTEINUR Negative 10/31/2013 1620   NITRITE Negative 10/31/2013 1620   LEUKOCYTESUR Trace 10/31/2013 1620   Sepsis Labs: @LABRCNTIP (procalcitonin:4,lacticidven:4) )No results found for this or any previous visit (from the past 240 hour(s)).   Radiological Exams on Admission: Dg Chest 2 View  Result Date: 08/21/2016 CLINICAL DATA:  Shortness of breath, worsened today. Cough for 3 weeks. EXAM: CHEST  2 VIEW COMPARISON:  May 27 1,015 FINDINGS: Increased interstitial markings in the lungs with small bilateral pleural effusions. No pneumothorax. Cardiomegaly. The hila and mediastinum are unchanged. No other acute interval changes. IMPRESSION: The findings are most consistent with mild edema and small effusions. Atypical infection could have a similar appearance in the appropriate clinical setting. Electronically Signed   By: Dorise Bullion III M.D   On: 08/21/2016 17:38    EKG: Sinus rhythm at 57 bpm with normal axis, 2nd degree AV block and nonspecific ST-T wave changes.   Assessment/Plan  This is a 76 y.o. male with a history of anxiety, rheumatoid arthritis, atrial fibrillation, diabetes, GERD, hyperlipidemia, hypertension, prostate cancer status post radiation, obstructive sleep apnea, NAFLD now being admitted  with:  #. Symptomatic anemia secondary to GI bleed. History of tubulovillous adenomas -IV Protonix 80mg  bolus followed by 8mg /hr -Transfuse 1 unit per ED order and recheck CBC post-transfusion -Nothing by mouth -Hold anticoagulants  - aspirin, Plavix  -GI consultation has been requested  #. Mild pulmonary edema and pleural effusions, possibly secondary to #1.  - Telemetry monitoring and continuous pulse ox - ACE, diuretic, nitrate.  - Intake/output, daily weight. - Trend troponins, check lipids and TSH. - Check echo - Cardiology consultation requested.  #. Known second degree heart block - Monitor on tele - Check echo - Cardio consult - patient has been in discussions with cardio about having PPM placed.    #. History of COPD/allergies - Continue Zyrtec, Flonase, Breo, Allegra  #. H/o Diabetes - Accuchecks q4h with RISS coverage - Hold metformin  #. History of GERD - Continue Protonix  #. History of HTN - Continue Lotrel, Imdur  #. History of HLD - Continue Lipitor  Admission status: Inpatient IV Fluids: HL Diet/Nutrition: HH, CC Consults called: GI, Cardio  DVT Px:  SCDs and early ambulation.  Chemoprophylaxis contraindicated at this time secondary to active bleed.  Code Status: Full Code  Disposition Plan: To home in 2-3 days  All the records are reviewed and case discussed with ED provider. Management plans discussed with the patient and/or family who express understanding and agree with plan of care.  Robina Hamor D.O. on 08/21/2016 at 9:25 PM Between 7am to 6pm - Pager - [phone] After 6pm go to [email or web address] - [employer] [facility] Office [phone] CC: Primary care physician; Pernell Dupre AHMED, MD   08/21/2016, 9:25 PM

## 2016-08-21 NOTE — ED Provider Notes (Addendum)
[facility] Emergency Department Provider Note        Time seen: ----------------------------------------- 6:40 PM on 08/21/2016 -----------------------------------------    I have reviewed the triage vital signs and the nursing notes.   HISTORY  Chief Complaint Shortness of Breath   HPI Omar [name] is a 76 y.o. male who presents ER for worsening shortness of breath. Patient states over the last 24 hours it has significantly worsened. He is to go to walk 3 blocks without shortness of breath but now he can't walk 10 feet without being short of breath he states. Patient says he has had a productive cough for 3 weeks and shortness of breath with exertion. He denies fevers, chills or other complaints.   Past Medical History:  Diagnosis Date  . Anxiety   . Arthritis    rheumatoid  . Atrial fibrillation (HCC)    hx of  . Colon polyp 07-07-15   TUBULAR ADENOMA WITH AT LEAST HIGH-GRADE / Dr Rayann Heman  . Diabetes mellitus without complication ([place])   . GERD (gastroesophageal reflux disease)   . Heart murmur   . Hypercholesterolemia   . Hypertension   . Prostate cancer ([place]) 01/01/13, 01/30/14   Gleason 3+4=7, volume 46.6 cc  . Rheumatoid arthritis ([place])   . S/P radiation therapy  04/03/2014 through 06/04/2014                                                      Prostate 7800 cGy in 40 sessions                          . Sleep apnea     Patient Active Problem List   Diagnosis Date Noted  . Allergic state 12/16/2015  . Anxiety 12/16/2015  . Atrial fibrillation ([place]) 12/16/2015  . Diabetes mellitus ([place]) 12/16/2015  . HLD (hyperlipidemia) 12/16/2015  . BP (high blood pressure) 12/16/2015  . Fatty liver disease, nonalcoholic 66/44/0347  . Apnea, sleep 12/16/2015  . Tubulovillous adenoma polyp of colon 08/19/2015  . Hernia of abdominal cavity 07/31/2015  . Malignant neoplasm of prostate ([place]) 02/20/2014  . Rheumatoid arthritis ([place]) 01/15/2014     Past Surgical History:  Procedure Laterality Date  . ABDOMINAL AORTIC ANEURYSM REPAIR  11/2013  . COLON SURGERY  March 2017   Right hemicolectomy for tubulovillous adenoma with high-grade dysplasia.  . COLONOSCOPY WITH PROPOFOL N/A 07/07/2015   Procedure: COLONOSCOPY WITH PROPOFOL;  Surgeon: Josefine Class, MD;  Location: [facility] ENDOSCOPY;  Service: Endoscopy;  Laterality: N/A;  . LAPAROSCOPIC RIGHT COLECTOMY Right 08/08/2015   Procedure: LAPAROSCOPIC RIGHT COLECTOMY;  Surgeon: Robert Bellow, MD;  Location: [facility] ORS;  Service: General;  Laterality: Right;  . NASAL SINUS SURGERY    . PROSTATE BIOPSY  01/01/13, 01/30/14   Gleason 3+3=6, vol 46.6 cc  . TONSILLECTOMY    . uvula surgery     for sleep apnea    Allergies Patient has no known allergies.  Social History Social History  Substance Use Topics  . Smoking status: Current Every Day Smoker    Packs/day: 2.00    Years: 50.00    Types: Cigarettes  . Smokeless tobacco: Never Used  . Alcohol use 0.6 oz/week    1 Cans of beer per week     Comment:  occasionally    Review of Systems Constitutional: Negative for fever. Cardiovascular: Negative for chest pain. Respiratory: Positive shortness of breath Gastrointestinal: Negative for abdominal pain, vomiting and diarrhea. Genitourinary: Negative for dysuria. Musculoskeletal: Negative for back pain. Positive for edema Skin: Negative for rash. Neurological: Negative for headaches, focal weakness or numbness.  10-point ROS otherwise negative.  ____________________________________________   PHYSICAL EXAM:  VITAL SIGNS: ED Triage Vitals [08/21/16 1702]  Enc Vitals Group     BP (!) 146/66     Pulse Rate (!) 57     Resp 20     Temp 98 F (36.7 C)     Temp Source Oral     SpO2 97 %     Weight 234 lb (106.1 kg)     Height 5\' 11"  (1.803 m)     Head Circumference      Peak Flow      Pain Score 0     Pain Loc      Pain Edu?      Excl. in [place]?     Constitutional:  Alert and oriented. Mild distress Eyes: Conjunctivae are normal. PERRL. Normal extraocular movements. ENT   Head: Normocephalic and atraumatic.   Nose: No congestion/rhinnorhea.   Mouth/Throat: Mucous membranes are moist.   Neck: No stridor. Cardiovascular: Normal rate, regular rhythm. No murmurs, rubs, or gallops. Respiratory: Tachypnea with basilar rales Gastrointestinal: Soft and nontender. Normal bowel sounds Rectal: Nontender, heme positive, no gross blood present Musculoskeletal: Nontender with normal range of motion in all extremities. Mild pitting edema is noted Neurologic:  Normal speech and language. No gross focal neurologic deficits are appreciated.  Skin:  Skin is warm, dry and intact. No rash noted. Psychiatric: Mood and affect are normal. Speech and behavior are normal.  ____________________________________________  EKG: Interpreted by me. Sinus rhythm with second-degree AV block rate is 57 bpm, normal axis, normal QRS size, normal QT.  ____________________________________________  ED COURSE:  Pertinent labs & imaging results that were available during my care of the patient were reviewed by me and considered in my medical decision making (see chart for details). Patient presents to the ER with dyspnea. He has a known heart block and has recently been seen by cardiology. Patient describes progressive dyspnea which appears to be CHF related. We will assess with labs and imaging.   Procedures ____________________________________________   LABS (pertinent positives/negatives)  Labs Reviewed  CBC WITH DIFFERENTIAL/PLATELET - Abnormal; Notable for the following:       Result Value   RBC 3.77 (*)    Hemoglobin 9.0 (*)    HCT 29.1 (*)    MCV 77.1 (*)    MCH 24.0 (*)    MCHC 31.1 (*)    RDW 24.6 (*)    Neutro Abs 8.2 (*)    Lymphs Abs 0.9 (*)    All other components within normal limits  BASIC METABOLIC PANEL - Abnormal; Notable for the following:     Chloride 115 (*)    CO2 20 (*)    Glucose, Bld 129 (*)    Calcium 8.5 (*)    GFR calc non Af Amer 57 (*)    All other components within normal limits  BRAIN NATRIURETIC PEPTIDE - Abnormal; Notable for the following:    B Natriuretic Peptide 483.0 (*)    All other components within normal limits  TROPONIN I    RADIOLOGY Images were viewed by me  Chest x-ray IMPRESSION: The findings are most consistent with mild edema  and small effusions. Atypical infection could have a similar appearance in the appropriate clinical setting. ____________________________________________  FINAL ASSESSMENT AND PLAN  Dyspnea, anemia, occult GI bleed, possible congestive heart failure  Plan: Patient with labs and imaging as dictated above. Patient presented to the ER with worsening shortness of breath. He does appear very dyspneic. Labs reveal anemia, he was found to be heme positive. He is taking protonix but has noted some black stools. I ordered a unit of blood for him, he will need echocardiogram and cardiology consultation. He has a known second degree heart block and will possibly need a pacemaker at some point. Anemia may have also led to some heart failure.   Earleen Newport, MD   Note: This note was generated in part or whole with voice recognition software. Voice recognition is usually quite accurate but there are transcription errors that can and very often do occur. I apologize for any typographical errors that were not detected and corrected.     Earleen Newport, MD 08/21/16 2006    Earleen Newport, MD 08/21/16 2007

## 2016-08-21 NOTE — ED Notes (Signed)
Attempt to call report, placed on hold for 12 minutes, calling back.

## 2016-08-21 NOTE — ED Notes (Signed)
Pt placed on oxygen at 1lpm via [place] for pox of 94% on ra. Pt with rebound pox of 98% after oxygen application.

## 2016-08-21 NOTE — ED Notes (Signed)
Attempt iv initiation x1 without success. Matt, rn in to attempt iv start. Pt placed on cont pox. Pt states he has been shob with exertion and at rest. Pt with history of copd. Pt is able to speak in full sentences, but does appear to have dyspnea with exertion.

## 2016-08-21 NOTE — ED Notes (Signed)
Pt updated on wait for bed assignment. Spoke with dr. Ara Kussmaul, hospitalist who is seeing pt for admission regarding ready blood for transfusion in lab. Per md it is ok to wait for blood transfusion until pt gets to floor.

## 2016-08-21 NOTE — ED Notes (Signed)
Pt transport to 206 

## 2016-08-21 NOTE — ED Notes (Signed)
Pt provided with meal tray. Dr. Jimmye Norman stated at 2020 that is it ok to wait until pt goes to admission room for type specific blood transfusion.

## 2016-08-21 NOTE — ED Triage Notes (Signed)
Pt reports to ED w/ c/o SOB that has gotten worse today. Pt sts SOB a "long time', but worse today. Pt also c/o non productive cough x 3 weeks. Pt alert, SOB w/ exertion, able to ambulate w/o assistance.

## 2016-08-22 ENCOUNTER — Inpatient Hospital Stay
Admit: 2016-08-22 | Discharge: 2016-08-22 | Disposition: A | Discharge status: Home / Self Care | Payer: Medicare Other | Attending: Family Medicine | Admitting: Family Medicine

## 2016-08-22 LAB — ECHOCARDIOGRAM COMPLETE
Height: 71 in
Weight: 3748.8 oz

## 2016-08-22 LAB — URINALYSIS, COMPLETE (UACMP) WITH MICROSCOPIC
BILIRUBIN URINE: NEGATIVE
Bacteria, UA: NONE SEEN
GLUCOSE, UA: NEGATIVE mg/dL
HGB URINE DIPSTICK: NEGATIVE
Ketones, ur: NEGATIVE mg/dL
LEUKOCYTES UA: NEGATIVE
NITRITE: NEGATIVE
PH: 5 (ref 5.0–8.0)
PROTEIN: NEGATIVE mg/dL
Specific Gravity, Urine: 1.006 (ref 1.005–1.030)
Squamous Epithelial / LPF: NONE SEEN

## 2016-08-22 LAB — CBC
HCT: 29.1 % — ABNORMAL LOW (ref 40.0–52.0)
Hemoglobin: 9.2 g/dL — ABNORMAL LOW (ref 13.0–18.0)
MCH: 24.1 pg — AB (ref 26.0–34.0)
MCHC: 31.6 g/dL — AB (ref 32.0–36.0)
MCV: 76.4 fL — ABNORMAL LOW (ref 80.0–100.0)
Platelets: 210 10*3/uL (ref 150–440)
RBC: 3.81 MIL/uL — ABNORMAL LOW (ref 4.40–5.90)
RDW: 24.9 % — ABNORMAL HIGH (ref 11.5–14.5)
WBC: 10.8 10*3/uL — ABNORMAL HIGH (ref 3.8–10.6)

## 2016-08-22 LAB — BASIC METABOLIC PANEL
Anion gap: 5 (ref 5–15)
BUN: 15 mg/dL (ref 6–20)
CALCIUM: 8.4 mg/dL — AB (ref 8.9–10.3)
CO2: 20 mmol/L — AB (ref 22–32)
Chloride: 115 mmol/L — ABNORMAL HIGH (ref 101–111)
Creatinine, Ser: 1.18 mg/dL (ref 0.61–1.24)
GFR calc non Af Amer: 59 mL/min — ABNORMAL LOW (ref >60)
Glucose, Bld: 107 mg/dL — ABNORMAL HIGH (ref 65–99)
Potassium: 4 mmol/L (ref 3.5–5.1)
Sodium: 140 mmol/L (ref 135–145)

## 2016-08-22 LAB — LACTIC ACID, PLASMA
LACTIC ACID, VENOUS: 1.5 mmol/L (ref 0.5–1.9)
Lactic Acid, Venous: 1.4 mmol/L (ref 0.5–1.9)

## 2016-08-22 LAB — TROPONIN I
Troponin I: <0.03 ng/mL (ref <0.03)
Troponin I: <0.03 ng/mL (ref <0.03)

## 2016-08-22 LAB — TYPE AND SCREEN
ABO/RH(D): O POS
Antibody Screen: NEGATIVE
UNIT DIVISION: 0

## 2016-08-22 LAB — LIPID PANEL
CHOLESTEROL: 91 mg/dL (ref 0–200)
HDL: 27 mg/dL — ABNORMAL LOW (ref >40)
LDL Cholesterol: 51 mg/dL (ref 0–99)
TRIGLYCERIDES: 67 mg/dL (ref <150)
Total CHOL/HDL Ratio: 3.4 RATIO
VLDL: 13 mg/dL (ref 0–40)

## 2016-08-22 LAB — BPAM RBC
Blood Product Expiration Date: 201803312359
ISSUE DATE / TIME: 201803172337
Unit Type and Rh: 5100

## 2016-08-22 LAB — GLUCOSE, CAPILLARY
Glucose-Capillary: 109 mg/dL — ABNORMAL HIGH (ref 65–99)
Glucose-Capillary: 112 mg/dL — ABNORMAL HIGH (ref 65–99)
Glucose-Capillary: 93 mg/dL (ref 65–99)
Glucose-Capillary: 109 mg/dL — ABNORMAL HIGH (ref 65–99)
Glucose-Capillary: 104 mg/dL — ABNORMAL HIGH (ref 65–99)

## 2016-08-22 LAB — TSH: TSH: 1.29 u[IU]/mL (ref 0.350–4.500)

## 2016-08-22 MED ORDER — SODIUM CHLORIDE 0.9 % IV SOLN
8.0000 mg/h | INTRAVENOUS | Status: DC
  Administered 2016-08-22: 8 mg/h via INTRAVENOUS
  Filled 2016-08-22: qty 80

## 2016-08-22 MED ORDER — DIPHENHYDRAMINE HCL 50 MG/ML IJ SOLN
25.0000 mg | Freq: Every day | INTRAMUSCULAR | Status: DC
  Administered 2016-08-23 – 2016-08-24 (×2): 25 mg via INTRAVENOUS
  Filled 2016-08-22 (×5): qty 1

## 2016-08-22 MED ORDER — TRIAMCINOLONE ACETONIDE 0.1 % EX CREA
TOPICAL_CREAM | Freq: Two times a day (BID) | CUTANEOUS | Status: DC
  Administered 2016-08-22 – 2016-08-23 (×2): via TOPICAL
  Administered 2016-08-24: 1 via TOPICAL
  Administered 2016-08-24 – 2016-08-27 (×4): via TOPICAL
  Filled 2016-08-22: qty 15

## 2016-08-22 MED ORDER — BENZONATATE 100 MG PO CAPS
100.0000 mg | ORAL_CAPSULE | Freq: Three times a day (TID) | ORAL | Status: DC | PRN
  Filled 2016-08-22 (×2): qty 1

## 2016-08-22 MED ORDER — FUROSEMIDE 10 MG/ML IJ SOLN
20.0000 mg | Freq: Two times a day (BID) | INTRAMUSCULAR | Status: DC
  Administered 2016-08-22 – 2016-08-24 (×5): 20 mg via INTRAVENOUS
  Filled 2016-08-22 (×5): qty 2

## 2016-08-22 MED ORDER — PANTOPRAZOLE SODIUM 40 MG IV SOLR
40.0000 mg | Freq: Two times a day (BID) | INTRAVENOUS | Status: DC
  Administered 2016-08-22 – 2016-08-23 (×3): 40 mg via INTRAVENOUS
  Filled 2016-08-22 (×3): qty 40

## 2016-08-22 MED ORDER — FUROSEMIDE 20 MG PO TABS: 20.0000 mg | ORAL_TABLET | Freq: Every day | ORAL | Status: DC

## 2016-08-22 MED ORDER — HYDROCOD POLST-CPM POLST ER 10-8 MG/5ML PO SUER
5.0000 mL | Freq: Two times a day (BID) | ORAL | Status: DC
  Administered 2016-08-22 – 2016-08-27 (×10): 5 mL via ORAL
  Filled 2016-08-22 (×10): qty 5

## 2016-08-22 MED ORDER — POTASSIUM CHLORIDE CRYS ER 20 MEQ PO TBCR
20.0000 meq | EXTENDED_RELEASE_TABLET | Freq: Two times a day (BID) | ORAL | Status: DC
  Administered 2016-08-22 – 2016-08-27 (×10): 20 meq via ORAL
  Filled 2016-08-22 (×10): qty 1

## 2016-08-22 MED ORDER — SODIUM CHLORIDE 0.45 % IV SOLN
INTRAVENOUS | Status: DC
  Administered 2016-08-22 – 2016-08-25 (×2): via INTRAVENOUS

## 2016-08-22 NOTE — Consult Note (Signed)
Consultation  Referring Provider:      Primary Care Physician:  Volanda Napoleon, MD Primary Gastroenterologist: San Jetty MD        Reason for Consultation:   GI bleed       Impression / Plan:   GI Bleed: Given h/x of melena will proceed first with EGD and schedule in am. Will have to be reassessed by anesthesia in am to determine if stable from pulmonary standpoint.          HPI:   Omar [name] is a 76 y.o. male with a known history of anxiety, rheumatoid arthritis, atrial fibrillation, diabetes, GERD, hyperlipidemia, hypertension, prostate cancer status post radiation, obstructive sleep apnea presents to the emergency department for evaluation of SOB and anemia HGB 9.0. Reports blacks stools and occasional minor hematochezia. Complains of mid abdominal pain with cough which radiates to back.  Received 1 UPRBCS last night with increase HGB to 9.2. Currently on ASA,Celebrex and Plavix.   S/P right hemicolectomy 08-2015 for advanced ascending adenomas detected on colonoscopy.  Colonoscopy also showed rectal angioectasias which were treated with thermal APC treatment on 06-2015.     Past Medical History:  Diagnosis Date  . Anxiety   . Arthritis    rheumatoid  . Atrial fibrillation (HCC)    hx of  . Colon polyp 07-07-15   TUBULAR ADENOMA WITH AT LEAST HIGH-GRADE / Dr Rayann Heman  . Diabetes mellitus without complication ([place])   . GERD (gastroesophageal reflux disease)   . Heart murmur   . Hypercholesterolemia   . Hypertension   . Prostate cancer ([place]) 01/01/13, 01/30/14   Gleason 3+4=7, volume 46.6 cc  . Rheumatoid arthritis ([place])   . S/P radiation therapy  04/03/2014 through 06/04/2014                                                      Prostate 7800 cGy in 40 sessions                          . Sleep apnea     Past Surgical History:  Procedure Laterality Date  . ABDOMINAL AORTIC ANEURYSM REPAIR  11/2013  . COLON SURGERY  March 2017   Right hemicolectomy for  tubulovillous adenoma with high-grade dysplasia.  . COLONOSCOPY WITH PROPOFOL N/A 07/07/2015   Procedure: COLONOSCOPY WITH PROPOFOL;  Surgeon: Josefine Class, MD;  Location: [facility] ENDOSCOPY;  Service: Endoscopy;  Laterality: N/A;  . LAPAROSCOPIC RIGHT COLECTOMY Right 08/08/2015   Procedure: LAPAROSCOPIC RIGHT COLECTOMY;  Surgeon: Robert Bellow, MD;  Location: [facility] ORS;  Service: General;  Laterality: Right;  . NASAL SINUS SURGERY    . PROSTATE BIOPSY  01/01/13, 01/30/14   Gleason 3+3=6, vol 46.6 cc  . TONSILLECTOMY    . uvula surgery     for sleep apnea    Family History  Problem Relation Age of Onset  . Heart attack Mother   . Cirrhosis Father   . Cancer Brother     pancreatic  . Diabetes Brother   . Diabetes Daughter     medication induced for cancer treatments  . Cancer Daughter     breast, brain      Social History  Substance Use Topics  . Smoking status: Current Every Day Smoker    Packs/day: 2.00  Years: 50.00    Types: Cigarettes  . Smokeless tobacco: Never Used  . Alcohol use 0.6 oz/week    1 Cans of beer per week     Comment: occasionally    Prior to Admission medications   Medication Sig Start Date End Date Taking? Authorizing Provider  ALPRAZolam (XANAX) 0.25 MG tablet Take 0.25 mg by mouth 2 (two) times daily.   Yes Historical Provider, MD  amLODipine-benazepril (LOTREL) 5-20 MG per capsule Take 1 capsule by mouth daily.   Yes Historical Provider, MD  aspirin EC 81 MG tablet Take 81 mg by mouth daily.   Yes Historical Provider, MD  atorvastatin (LIPITOR) 80 MG tablet Take 80 mg by mouth at bedtime.    Yes Historical Provider, MD  busPIRone (BUSPAR) 5 MG tablet Take 5 mg by mouth 2 (two) times daily.    Yes Historical Provider, MD  Butalbital-APAP-Caffeine 50-300-40 MG CAPS Take 1 capsule by mouth every 6 (six) hours as needed (for migraines).    Yes Historical Provider, MD  celecoxib (CELEBREX) 200 MG capsule Take 200 mg by mouth daily.    Yes  Historical Provider, MD  cetirizine (ZYRTEC) 10 MG tablet Take 10 mg by mouth daily.   Yes Historical Provider, MD  clopidogrel (PLAVIX) 75 MG tablet Take 75 mg by mouth daily.   Yes Historical Provider, MD  diphenoxylate-atropine (LOMOTIL) 2.5-0.025 MG tablet Take 1 tablet by mouth 4 (four) times daily as needed for diarrhea or loose stools.   Yes Historical Provider, MD  fluticasone (FLONASE) 50 MCG/ACT nasal spray Place 2 sprays into both nostrils daily as needed for rhinitis.    Yes Historical Provider, MD  Fluticasone Furoate-Vilanterol (BREO ELLIPTA) 100-25 MCG/INH AEPB Inhale 1 puff into the lungs daily.    Yes Historical Provider, MD  folic acid (FOLVITE) 1 MG tablet Take 1 mg by mouth daily.   Yes Historical Provider, MD  gabapentin (NEURONTIN) 100 MG capsule Take 100 mg by mouth at bedtime.   Yes Historical Provider, MD  isosorbide mononitrate (IMDUR) 30 MG 24 hr tablet Take 30 mg by mouth daily.   Yes Historical Provider, MD  metFORMIN (GLUCOPHAGE) 500 MG tablet Take 500 mg by mouth 2 (two) times daily with a meal.   Yes Historical Provider, MD  pantoprazole (PROTONIX) 40 MG tablet Take 40 mg by mouth every other day.    Yes Historical Provider, MD  triamcinolone cream (KENALOG) 0.1 % Apply 1 application topically 2 (two) times daily.   Yes Historical Provider, MD  CHERATUSSIN AC 100-10 MG/5ML syrup 4 (four) times daily as needed.  11/11/15   Historical Provider, MD  cyclobenzaprine (FLEXERIL) 5 MG tablet Take 5 mg by mouth 3 (three) times daily as needed for muscle spasms.    Historical Provider, MD  fexofenadine (ALLEGRA) 180 MG tablet Take 180 mg by mouth daily.    Historical Provider, MD  tamsulosin (FLOMAX) 0.4 MG CAPS capsule Take 1 capsule (0.4 mg total) by mouth daily. Patient not taking: Reported on 08/21/2016 06/03/14   Arloa Koh, MD    Current Facility-Administered Medications  Medication Dose Route Frequency Provider Last Rate Last Dose  . 0.9 %  sodium chloride infusion   10 mL/hr Intravenous Once Earleen Newport, MD   Stopped at 08/22/16 1144  . 0.9 %  sodium chloride infusion  250 mL Intravenous PRN Alexis Hugelmeyer, DO      . acetaminophen (TYLENOL) tablet 650 mg  650 mg Oral Q6H PRN [employer], DO  Or  . acetaminophen (TYLENOL) suppository 650 mg  650 mg Rectal Q6H PRN Alexis Hugelmeyer, DO      . albuterol (PROVENTIL) (2.5 MG/3ML) 0.083% nebulizer solution 2.5 mg  2.5 mg Nebulization Q6H PRN Alexis Hugelmeyer, DO   2.5 mg at 08/22/16 0123  . ALPRAZolam Duanne Moron) tablet 0.25 mg  0.25 mg Oral BID Alexis Hugelmeyer, DO   Stopped at 08/22/16 1119  . atorvastatin (LIPITOR) tablet 80 mg  80 mg Oral QHS Alexis Hugelmeyer, DO   80 mg at 08/22/16 0056  . benazepril (LOTENSIN) tablet 20 mg  20 mg Oral Daily Theodoro Grist, MD   Stopped at 08/22/16 1119  . benzonatate (TESSALON) capsule 100 mg  100 mg Oral TID PRN Theodoro Grist, MD      . busPIRone (BUSPAR) tablet 5 mg  5 mg Oral BID Alexis Hugelmeyer, DO   Stopped at 08/22/16 1119  . chlorpheniramine-HYDROcodone (TUSSIONEX) 10-8 MG/5ML suspension 5 mL  5 mL Oral Q12H Theodoro Grist, MD   Stopped at 08/22/16 1119  . fluticasone (FLONASE) 50 MCG/ACT nasal spray 2 spray  2 spray Each Nare Daily PRN Alexis Hugelmeyer, DO      . fluticasone furoate-vilanterol (BREO ELLIPTA) 100-25 MCG/INH 1 puff  1 puff Inhalation Daily Alexis Hugelmeyer, DO   Stopped at 08/22/16 1119  . folic acid (FOLVITE) tablet 1 mg  1 mg Oral Daily Alexis Hugelmeyer, DO   Stopped at 08/22/16 1119  . furosemide (LASIX) injection 20 mg  20 mg Intravenous BID Theodoro Grist, MD   Stopped at 08/22/16 1119  . gabapentin (NEURONTIN) capsule 100 mg  100 mg Oral QHS Alexis Hugelmeyer, DO   100 mg at 08/22/16 0056  . insulin aspart (novoLOG) injection 0-20 Units  0-20 Units Subcutaneous Q4H Alexis Hugelmeyer, DO      . ipratropium (ATROVENT) nebulizer solution 0.5 mg  0.5 mg Nebulization Q6H PRN Alexis Hugelmeyer, DO      . isosorbide mononitrate  (IMDUR) 24 hr tablet 30 mg  30 mg Oral Daily Alexis Hugelmeyer, DO   Stopped at 08/22/16 1119  . loratadine (CLARITIN) tablet 10 mg  10 mg Oral Daily [employer], DO   Stopped at 08/22/16 1119  . ondansetron (ZOFRAN) tablet 4 mg  4 mg Oral Q6H PRN Alexis Hugelmeyer, DO       Or  . ondansetron (ZOFRAN) injection 4 mg  4 mg Intravenous Q6H PRN Alexis Hugelmeyer, DO      . oxyCODONE (Oxy IR/ROXICODONE) immediate release tablet 5 mg  5 mg Oral Q4H PRN Alexis Hugelmeyer, DO      . pantoprazole (PROTONIX) injection 40 mg  40 mg Intravenous Q12H Theodoro Grist, MD   40 mg at 08/22/16 0840  . potassium chloride SA (K-DUR,KLOR-CON) CR tablet 20 mEq  20 mEq Oral BID Theodoro Grist, MD   Stopped at 08/22/16 1119  . sodium chloride flush (NS) 0.9 % injection 3 mL  3 mL Intravenous Q12H Alexis Hugelmeyer, DO   Stopped at 08/22/16 1119  . sodium chloride flush (NS) 0.9 % injection 3 mL  3 mL Intravenous PRN Alexis Hugelmeyer, DO        Allergies as of 08/21/2016  . (No Known Allergies)     Review of Systems:    This is positive for those things mentioned in the HPI,       Physical Exam:  Vital signs in last 24 hours: Temp:  [97.7 F (36.5 C)-98.2 F (36.8 C)] 97.7 F (36.5 C) (03/18 1156) Pulse Rate:  [  49-68] 50 (03/18 1156) Resp:  [16-28] 16 (03/18 0450) BP: (113-153)/(55-81) 145/63 (03/18 1156) SpO2:  [88 %-100 %] 100 % (03/18 1156) Weight:  [106.1 kg (234 lb)-106.3 kg (234 lb 4.8 oz)] 106.3 kg (234 lb 4.8 oz) (03/17 2309) Last BM Date: 08/21/16  General:  Well-developed, well-nourished and in no acute distress Eyes:  anicteric. ENT:   Mouth and posterior pharynx free of lesions.  Neck:   supple w/o thyromegaly or mass.  Lungs: Crackles at bases bilaterally. Heart:  S1S2, no rubs, murmurs, gallops. Abdomen:  soft, non-tender but distended, no hepatosplenomegaly, hernia, or mass and BS+.  Rectal: Lymph:  no cervical or supraclavicular adenopathy. Extremities:  2/4 bilateral  edema Skin   no rash. Neuro:  A&O x 3.  Psych:  appropriate mood and  Affect.   Data Reviewed:   LAB RESULTS:  Recent Labs  08/21/16 1912 08/22/16 0447  WBC 10.3 10.8*  HGB 9.0* 9.2*  HCT 29.1* 29.1*  PLT 221 210   BMET  Recent Labs  08/21/16 1912 08/22/16 0447  NA 141 140  K 3.9 4.0  CL 115* 115*  CO2 20* 20*  GLUCOSE 129* 107*  BUN 16 15  CREATININE 1.21 1.18  CALCIUM 8.5* 8.4*   LFT No results for input(s): PROT, ALBUMIN, AST, ALT, ALKPHOS, BILITOT, BILIDIR, IBILI in the last 72 hours. PT/INR No results for input(s): LABPROT, INR in the last 72 hours.  STUDIES: Dg Chest 2 View  Result Date: 08/21/2016 CLINICAL DATA:  Shortness of breath, worsened today. Cough for 3 weeks. EXAM: CHEST  2 VIEW COMPARISON:  May 27 1,015 FINDINGS: Increased interstitial markings in the lungs with small bilateral pleural effusions. No pneumothorax. Cardiomegaly. The hila and mediastinum are unchanged. No other acute interval changes. IMPRESSION: The findings are most consistent with mild edema and small effusions. Atypical infection could have a similar appearance in the appropriate clinical setting. Electronically Signed   By: Dorise Bullion III M.D   On: 08/21/2016 17:38     PREVIOUS ENDOSCOPIES:                Thanks   LOS: 1 day   San Jetty MD  @  08/22/2016, 12:13 PM

## 2016-08-22 NOTE — Evaluation (Signed)
Physical Therapy Evaluation Patient Details Name: Omar [name] MRN: [id number] DOB: 11-11-1940 Today's Date: 08/22/2016   History of Present Illness  76 yo male with onset of upper GI bleed and passing blood, with flat troponins, lung edema, SOB.  PMHx:  2nd degree heart block, RA, a-fib, DM, adenomas, OSA, prostate CA, GERD,  Clinical Impression  Pt is demonstrating some significant limitations with mobility not related to pulse ranging 54 to 64, sats 94 to 97%.  His SOB and side pain from lung edema are restrictive and will work with pt acutely to increase activity gently and with observation of his tolerance with SOB.  Will need to try stairs to get home, and has no railing to support the effort.  Continue PT as tolerated.    Follow Up Recommendations Home health PT;Supervision/Assistance - 24 hour    Equipment Recommendations  Rolling walker with 5" wheels (if he does not have adequate equipment)    Recommendations for Other Services       Precautions / Restrictions Precautions Precautions: Fall (telemetry including O2 monitor) Restrictions Weight Bearing Restrictions: No      Mobility  Bed Mobility               General bed mobility comments: EOB when PT arrived  Transfers Overall transfer level: Needs assistance Equipment used: Rolling walker (2 wheeled);1 person hand held assist Transfers: Sit to/from [facility] Sit to Stand: Min assist Stand pivot transfers: Min assist       General transfer comment: cued hand placement to stand  Ambulation/Gait Ambulation/Gait assistance: Min assist Ambulation Distance (Feet): 35 Feet Assistive device: 1 person hand held assist Gait Pattern/deviations: Step-through pattern;Step-to pattern;Wide base of support;Shuffle;Decreased stride length Gait velocity: reduced Gait velocity interpretation: Below normal speed for age/gender General Gait Details: pt had one staggering moment at the door to his room  and PT caught him   Stairs            Wheelchair Mobility    Modified Rankin (Stroke Patients Only)       Balance                                             Pertinent Vitals/Pain Pain Assessment: 0-10 Pain Score: 6  Pain Location: abdomen Pain Descriptors / Indicators: Sore;Tightness Pain Intervention(s): Limited activity within patient's tolerance;Monitored during session;Premedicated before session;Repositioned;Patient requesting pain meds-RN notified    Home Living Family/patient expects to be discharged to:: Private residence Living Arrangements: Non-relatives/Friends Available Help at Discharge: Family;Friend(s);Available 24 hours/day Type of Home: House Home Access: Stairs to enter Entrance Stairs-Rails: None Entrance Stairs-Number of Steps: 2 Home Layout: One level Home Equipment: Cane - single point;Walker - 2 wheels;Walker - 4 wheels      Prior Function Level of Independence: Independent               Hand Dominance   Dominant Hand: Right    Extremity/Trunk Assessment   Upper Extremity Assessment Upper Extremity Assessment: Overall WFL for tasks assessed    Lower Extremity Assessment Lower Extremity Assessment: Overall WFL for tasks assessed    Cervical / Trunk Assessment Cervical / Trunk Assessment: Normal  Communication   Communication: No difficulties  Cognition Arousal/Alertness: Awake/alert Behavior During Therapy: WFL for tasks assessed/performed Overall Cognitive Status: Within Functional Limits for tasks assessed  General Comments      Exercises General Exercises - Lower Extremity Toe Raises: Strengthening;Both;15 reps Mini-Sqauts: Strengthening;Both;10 reps   Assessment/Plan    PT Assessment Patient needs continued PT services  PT Problem List Decreased strength;Decreased range of motion;Decreased activity tolerance;Decreased balance;Decreased mobility;Decreased  coordination;Decreased knowledge of use of DME;Decreased safety awareness;Cardiopulmonary status limiting activity;Obesity;Pain       PT Treatment Interventions DME instruction;Gait training;Stair training;Functional mobility training;Therapeutic exercise;Therapeutic activities;Balance training;Neuromuscular re-education;Patient/family education    PT Goals (Current goals can be found in the Care Plan section)  Acute Rehab PT Goals Patient Stated Goal: to feel stronger and better PT Goal Formulation: With patient/family Time For Goal Achievement: 09/05/16 Potential to Achieve Goals: Good    Frequency Min 2X/week   Barriers to discharge Inaccessible home environment will need to try stairs    Co-evaluation               End of Session Equipment Utilized During Treatment: Gait belt;Oxygen Activity Tolerance: Patient tolerated treatment well;Treatment limited secondary to medical complications (Comment) (SOB significantly limits his tolerance despite good O2 sats) Patient left: in bed;with call bell/phone within reach;with bed alarm set;with family/visitor present (sitting bedside) Nurse Communication: Mobility status;Other (comment) (symptoms with gait) PT Visit Diagnosis: Unsteadiness on feet (R26.81);Difficulty in walking, not elsewhere classified (R26.2)    Functional Assessment Tool Used: AM-PAC 6 Clicks Basic Mobility    Time: 1338-1410 PT Time Calculation (min) (ACUTE ONLY): 32 min   Charges:   PT Evaluation $PT Eval Moderate Complexity: 1 Procedure PT Treatments $Gait Training: 8-22 mins   PT G Codes:   PT G-Codes **NOT FOR INPATIENT CLASS** Functional Assessment Tool Used: AM-PAC 6 Clicks Basic Mobility     Ramond Dial 08/22/2016, 3:41 PM   Mee Hives, PT MS Acute Rehab Dept. Number: [place] [facility] [place]

## 2016-08-22 NOTE — Plan of Care (Signed)
Problem: Acute Rehab PT Goals(only PT should resolve) Goal: Pt Will Go Up/Down Stairs Due to lack of railing

## 2016-08-22 NOTE — Consult Note (Signed)
Omar [name] is a 75 y.o. male  [id number]  Primary Cardiologist: Dr. Neoma Laming   Reason for Consultation: Fluid retention     HPI: Omar [name] is a 76 y.o. male with a known history of anxiety, rheumatoid arthritis, atrial fibrillation, diabetes, GERD, hyperlipidemia, hypertension, prostate cancer status post radiation, obstructive sleep apnea presents to the emergency department for evaluation of SOB. Stools were bright red recently.    Review of Systems: Omar [name] reports feeling short of breath with no chest pain. Omar [name] was seen in our office on Friday and reported feeling well and stated he could walk 3 blocks without tiring. He reported he soon started feeling ill and noticing fluid retention in his feet over the weekend and his shortness of breath suddenly worsened so she came to the ER yesterday.   Past Medical History:  Diagnosis Date  . Anxiety   . Arthritis    rheumatoid  . Atrial fibrillation (HCC)    hx of  . Colon polyp 07-07-15   TUBULAR ADENOMA WITH AT LEAST HIGH-GRADE / Dr Rayann Heman  . Diabetes mellitus without complication ([place])   . GERD (gastroesophageal reflux disease)   . Heart murmur   . Hypercholesterolemia   . Hypertension   . Prostate cancer ([place]) 01/01/13, 01/30/14   Gleason 3+4=7, volume 46.6 cc  . Rheumatoid arthritis ([place])   . S/P radiation therapy  04/03/2014 through 06/04/2014                                                      Prostate 7800 cGy in 40 sessions                          . Sleep apnea     Medications Prior to Admission  Medication Sig Dispense Refill  . ALPRAZolam (XANAX) 0.25 MG tablet Take 0.25 mg by mouth 2 (two) times daily.    Marland Kitchen amLODipine-benazepril (LOTREL) 5-20 MG per capsule Take 1 capsule by mouth daily.    Marland Kitchen aspirin EC 81 MG tablet Take 81 mg by mouth daily.    Marland Kitchen atorvastatin (LIPITOR) 80 MG tablet Take 80 mg by mouth at bedtime.     . busPIRone (BUSPAR) 5 MG tablet Take 5 mg by mouth 2 (two) times daily.     .  Butalbital-APAP-Caffeine 50-300-40 MG CAPS Take 1 capsule by mouth every 6 (six) hours as needed (for migraines).     . celecoxib (CELEBREX) 200 MG capsule Take 200 mg by mouth daily.     . cetirizine (ZYRTEC) 10 MG tablet Take 10 mg by mouth daily.    . clopidogrel (PLAVIX) 75 MG tablet Take 75 mg by mouth daily.    . diphenoxylate-atropine (LOMOTIL) 2.5-0.025 MG tablet Take 1 tablet by mouth 4 (four) times daily as needed for diarrhea or loose stools.    . fluticasone (FLONASE) 50 MCG/ACT nasal spray Place 2 sprays into both nostrils daily as needed for rhinitis.     . Fluticasone Furoate-Vilanterol (BREO ELLIPTA) 100-25 MCG/INH AEPB Inhale 1 puff into the lungs daily.     . folic acid (FOLVITE) 1 MG tablet Take 1 mg by mouth daily.    Marland Kitchen gabapentin (NEURONTIN) 100 MG capsule Take 100 mg by mouth at bedtime.    . isosorbide mononitrate (IMDUR) 30  MG 24 hr tablet Take 30 mg by mouth daily.    . metFORMIN (GLUCOPHAGE) 500 MG tablet Take 500 mg by mouth 2 (two) times daily with a meal.    . pantoprazole (PROTONIX) 40 MG tablet Take 40 mg by mouth every other day.     . triamcinolone cream (KENALOG) 0.1 % Apply 1 application topically 2 (two) times daily.    Marland Kitchen CHERATUSSIN AC 100-10 MG/5ML syrup 4 (four) times daily as needed.     . cyclobenzaprine (FLEXERIL) 5 MG tablet Take 5 mg by mouth 3 (three) times daily as needed for muscle spasms.    . fexofenadine (ALLEGRA) 180 MG tablet Take 180 mg by mouth daily.    . tamsulosin (FLOMAX) 0.4 MG CAPS capsule Take 1 capsule (0.4 mg total) by mouth daily. (Patient not taking: Reported on 08/21/2016) 30 capsule 3     . sodium chloride  10 mL/hr Intravenous Once  . ALPRAZolam  0.25 mg Oral BID  . amLODipine  5 mg Oral Daily   And  . benazepril  20 mg Oral Daily  . atorvastatin  80 mg Oral QHS  . busPIRone  5 mg Oral BID  . fluticasone furoate-vilanterol  1 puff Inhalation Daily  . folic acid  1 mg Oral Daily  . gabapentin  100 mg Oral QHS  . insulin  aspart  0-20 Units Subcutaneous Q4H  . isosorbide mononitrate  30 mg Oral Daily  . loratadine  10 mg Oral Daily  . pantoprazole  40 mg Intravenous Q12H  . sodium chloride flush  3 mL Intravenous Q12H    Infusions:   No Known Allergies  Social History   Social History  . Marital status: Divorced    Spouse name: N/A  . Number of children: N/A  . Years of education: N/A   Occupational History  . Not on file.   Social History Main Topics  . Smoking status: Current Every Day Smoker    Packs/day: 2.00    Years: 50.00    Types: Cigarettes  . Smokeless tobacco: Never Used  . Alcohol use 0.6 oz/week    1 Cans of beer per week     Comment: occasionally  . Drug use: No  . Sexual activity: Not on file   Other Topics Concern  . Not on file   Social History Narrative  . No narrative on file    Family History  Problem Relation Age of Onset  . Heart attack Mother   . Cirrhosis Father   . Cancer Brother     pancreatic  . Diabetes Brother   . Diabetes Daughter     medication induced for cancer treatments  . Cancer Daughter     breast, brain    PHYSICAL EXAM: Vitals:   08/22/16 0255 08/22/16 0450  BP: (!) 113/57 (!) 121/55  Pulse: (!) 49 (!) 51  Resp: (!) 24 16  Temp: 98.2 F (36.8 C)      Intake/Output Summary (Last 24 hours) at 08/22/16 0828 Last data filed at 08/22/16 0500  Gross per 24 hour  Intake           495.83 ml  Output              700 ml  Net          -204.17 ml    General:  Well appearing. No respiratory difficulty HEENT: normal Neck: supple. no JVD. Carotids 2+ bilat; no bruits. No lymphadenopathy or thryomegaly appreciated. Cor:  PMI nondisplaced. Regular rate & rhythm. No rubs, gallops or murmurs. Lungs: congested Abdomen: soft, nontender, nondistended. No hepatosplenomegaly. No bruits or masses. Good bowel sounds. Extremities: no cyanosis, clubbing, rash, edema Neuro: alert & oriented x 3, cranial nerves grossly intact. moves all 4  extremities w/o difficulty. Affect pleasant.  ECG: Sinus rhythm with 2nd degree AV block ([place] I)  Results for orders placed or performed during the hospital encounter of 08/21/16 (from the past 24 hour(s))  CBC with Differential     Status: Abnormal   Collection Time: 08/21/16  7:12 PM  Result Value Ref Range   WBC 10.3 3.8 - 10.6 K/uL   RBC 3.77 (L) 4.40 - 5.90 MIL/uL   Hemoglobin 9.0 (L) 13.0 - 18.0 g/dL   HCT 29.1 (L) 40.0 - 52.0 %   MCV 77.1 (L) 80.0 - 100.0 fL   MCH 24.0 (L) 26.0 - 34.0 pg   MCHC 31.1 (L) 32.0 - 36.0 g/dL   RDW 24.6 (H) 11.5 - 14.5 %   Platelets 221 150 - 440 K/uL   Neutrophils Relative % 78 %   Neutro Abs 8.2 (H) 1.4 - 6.5 K/uL   Lymphocytes Relative 9 %   Lymphs Abs 0.9 (L) 1.0 - 3.6 K/uL   Monocytes Relative 8 %   Monocytes Absolute 0.8 0.2 - 1.0 K/uL   Eosinophils Relative 4 %   Eosinophils Absolute 0.4 0 - 0.7 K/uL   Basophils Relative 1 %   Basophils Absolute 0.1 0 - 0.1 K/uL  Basic metabolic panel     Status: Abnormal   Collection Time: 08/21/16  7:12 PM  Result Value Ref Range   Sodium 141 135 - 145 mmol/L   Potassium 3.9 3.5 - 5.1 mmol/L   Chloride 115 (H) 101 - 111 mmol/L   CO2 20 (L) 22 - 32 mmol/L   Glucose, Bld 129 (H) 65 - 99 mg/dL   BUN 16 6 - 20 mg/dL   Creatinine, Ser 1.21 0.61 - 1.24 mg/dL   Calcium 8.5 (L) 8.9 - 10.3 mg/dL   GFR calc non Af Amer 57 (L) >60 mL/min   GFR calc Af Amer >60 >60 mL/min   Anion gap 6 5 - 15  Brain natriuretic peptide     Status: Abnormal   Collection Time: 08/21/16  7:12 PM  Result Value Ref Range   B Natriuretic Peptide 483.0 (H) 0.0 - 100.0 pg/mL  Troponin I     Status: None   Collection Time: 08/21/16  7:12 PM  Result Value Ref Range   Troponin I <0.03 <0.03 ng/mL  Ferritin     Status: Abnormal   Collection Time: 08/21/16  7:12 PM  Result Value Ref Range   Ferritin 7 (L) 24 - 336 ng/mL  Prepare RBC     Status: None   Collection Time: 08/21/16  8:12 PM  Result Value Ref Range   Order  Confirmation ORDER PROCESSED BY BLOOD BANK   Type and screen [facility]     Status: None   Collection Time: 08/21/16  8:12 PM  Result Value Ref Range   ABO/RH(D) O POS    Antibody Screen NEG    Sample Expiration 08/24/2016    Unit Number [id number]    Blood Component Type RED CELLS,LR    Unit division 00    Status of Unit ISSUED,FINAL    Transfusion Status OK TO TRANSFUSE    Crossmatch Result Compatible   Glucose, capillary  Status: Abnormal   Collection Time: 08/21/16 11:10 PM  Result Value Ref Range   Glucose-Capillary 115 (H) 65 - 99 mg/dL  Troponin I     Status: None   Collection Time: 08/21/16 11:31 PM  Result Value Ref Range   Troponin I <0.03 <0.03 ng/mL  TSH     Status: None   Collection Time: 08/21/16 11:31 PM  Result Value Ref Range   TSH 1.290 0.350 - 4.500 uIU/mL  Basic metabolic panel     Status: Abnormal   Collection Time: 08/22/16  4:47 AM  Result Value Ref Range   Sodium 140 135 - 145 mmol/L   Potassium 4.0 3.5 - 5.1 mmol/L   Chloride 115 (H) 101 - 111 mmol/L   CO2 20 (L) 22 - 32 mmol/L   Glucose, Bld 107 (H) 65 - 99 mg/dL   BUN 15 6 - 20 mg/dL   Creatinine, Ser 1.18 0.61 - 1.24 mg/dL   Calcium 8.4 (L) 8.9 - 10.3 mg/dL   GFR calc non Af Amer 59 (L) >60 mL/min   GFR calc Af Amer >60 >60 mL/min   Anion gap 5 5 - 15  CBC     Status: Abnormal   Collection Time: 08/22/16  4:47 AM  Result Value Ref Range   WBC 10.8 (H) 3.8 - 10.6 K/uL   RBC 3.81 (L) 4.40 - 5.90 MIL/uL   Hemoglobin 9.2 (L) 13.0 - 18.0 g/dL   HCT 29.1 (L) 40.0 - 52.0 %   MCV 76.4 (L) 80.0 - 100.0 fL   MCH 24.1 (L) 26.0 - 34.0 pg   MCHC 31.6 (L) 32.0 - 36.0 g/dL   RDW 24.9 (H) 11.5 - 14.5 %   Platelets 210 150 - 440 K/uL  Troponin I     Status: None   Collection Time: 08/22/16  4:47 AM  Result Value Ref Range   Troponin I <0.03 <0.03 ng/mL  Lipid panel     Status: Abnormal   Collection Time: 08/22/16  4:47 AM  Result Value Ref Range   Cholesterol 91 0 - 200  mg/dL   Triglycerides 67 <150 mg/dL   HDL 27 (L) >40 mg/dL   Total CHOL/HDL Ratio 3.4 RATIO   VLDL 13 0 - 40 mg/dL   LDL Cholesterol 51 0 - 99 mg/dL  Glucose, capillary     Status: Abnormal   Collection Time: 08/22/16  4:49 AM  Result Value Ref Range   Glucose-Capillary 109 (H) 65 - 99 mg/dL  Glucose, capillary     Status: Abnormal   Collection Time: 08/22/16  7:42 AM  Result Value Ref Range   Glucose-Capillary 112 (H) 65 - 99 mg/dL   Comment 1 Notify RN    Dg Chest 2 View  Result Date: 08/21/2016 CLINICAL DATA:  Shortness of breath, worsened today. Cough for 3 weeks. EXAM: CHEST  2 VIEW COMPARISON:  May 27 1,015 FINDINGS: Increased interstitial markings in the lungs with small bilateral pleural effusions. No pneumothorax. Cardiomegaly. The hila and mediastinum are unchanged. No other acute interval changes. IMPRESSION: The findings are most consistent with mild edema and small effusions. Atypical infection could have a similar appearance in the appropriate clinical setting. Electronically Signed   By: Dorise Bullion III M.D   On: 08/21/2016 17:38     ASSESSMENT AND PLAN: Shortness of breath secondary to anemia and fluid retention. Troponin is negative. Pt does not have a history of CHF but does have a long known history of sinus  bradycardia and 2nd degree AV block. He experienced a quick decline from his usual state of health when he was seen 2 days ago in our office for cardiac clearance for an endoscopy.   Start daily PO lasix 20mg . Check magnesium and BNP with next lab draw. Continue oxygen supplementation as needed while anemia and GI bleeding is addressed. Will review echo results once available.  Jake Bathe

## 2016-08-22 NOTE — Progress Notes (Signed)
[place] [facility] [place] NAME: Omar [name]    MR#:  [id number]  DATE OF BIRTH:  Jun 10, 1940  SUBJECTIVE:  CHIEF COMPLAINT:   Chief Complaint  Patient presents with  . Shortness of Breath   The patient is 76 year old male with past medical history significant for history of anxiety, rheumatoid arthritis, A. fib, diabetes, hypertension, hyperlipidemia, gastroesophageal reflux disease, prostate cancer, obstructive sleep apnea, who presents to the hospital with complaints of shortness of breath. He also complained of some black stools as well as bright red blood per rectum. He was noted to have lower extremity edema, his chest x-ray was concerning for congestive heart failure, he was admitted for IV Lasix. The patient was diuresed about 210 cc over his stay in the hospital time, he feels somewhat improved, still requires 2 L of oxygen through nasal cannula, not on oxygen at home. ROS  VITAL SIGNS: Blood pressure (!) 145/63, pulse (!) 50, temperature 97.7 F (36.5 C), temperature source Oral, resp. rate 16, height 5\' 11"  (1.803 m), weight 106.3 kg (234 lb 4.8 oz), SpO2 100 %.  PHYSICAL EXAMINATION:   GENERAL:  76 y.o.-year-old patient lying in the bed in mild-to-moderate respiratory distress, tachypneic, uncomfortable, using accessory muscles to breathe, anxious  EYES: Pupils equal, round, reactive to light and accommodation. No scleral icterus. Extraocular muscles intact.  HEENT: Head atraumatic, normocephalic. Oropharynx and nasopharynx clear.  NECK:  Supple, no jugular venous distention. No thyroid enlargement, no tenderness.  LUNGS: Some diminished breath sounds bilaterally, scattered wheezing, rales,rhonchi , bilateral posterior half lower lung field crepitations. Intermittent use of accessory muscles of respiration.  CARDIOVASCULAR: S1, S2 , rhythm is regular. No murmurs, rubs, or gallops.  ABDOMEN: Soft, nontender, nondistended. Bowel  sounds present. No organomegaly or mass.  EXTREMITIES: 2+ lower extremity and pedal edema, no cyanosis, or clubbing.  NEUROLOGIC: Cranial nerves II through XII are intact. Muscle strength 5/5 in all extremities. Sensation intact. Gait not checked.  PSYCHIATRIC: The patient is alert and oriented x 3.  SKIN: No obvious rash, lesion, or ulcer.   ORDERS/RESULTS REVIEWED:   CBC  Recent Labs Lab 08/21/16 1912 08/22/16 0447  WBC 10.3 10.8*  HGB 9.0* 9.2*  HCT 29.1* 29.1*  PLT 221 210  MCV 77.1* 76.4*  MCH 24.0* 24.1*  MCHC 31.1* 31.6*  RDW 24.6* 24.9*  LYMPHSABS 0.9*  --   MONOABS 0.8  --   EOSABS 0.4  --   BASOSABS 0.1  --    ------------------------------------------------------------------------------------------------------------------  Chemistries   Recent Labs Lab 08/21/16 1912 08/22/16 0447  NA 141 140  K 3.9 4.0  CL 115* 115*  CO2 20* 20*  GLUCOSE 129* 107*  BUN 16 15  CREATININE 1.21 1.18  CALCIUM 8.5* 8.4*   ------------------------------------------------------------------------------------------------------------------ estimated creatinine clearance is 68.1 mL/min (by C-G formula based on SCr of 1.18 mg/dL). ------------------------------------------------------------------------------------------------------------------  Recent Labs  08/21/16 2331  TSH 1.290    Cardiac Enzymes  Recent Labs Lab 08/21/16 2331 08/22/16 0447 08/22/16 1039  TROPONINI <0.03 <0.03 <0.03   ------------------------------------------------------------------------------------------------------------------ Invalid input(s): POCBNP ---------------------------------------------------------------------------------------------------------------  RADIOLOGY: Dg Chest 2 View  Result Date: 08/21/2016 CLINICAL DATA:  Shortness of breath, worsened today. Cough for 3 weeks. EXAM: CHEST  2 VIEW COMPARISON:  May 27 1,015 FINDINGS: Increased interstitial markings in the lungs with  small bilateral pleural effusions. No pneumothorax. Cardiomegaly. The hila and mediastinum are unchanged. No other acute interval changes. IMPRESSION: The findings are most consistent with mild edema  and small effusions. Atypical infection could have a similar appearance in the appropriate clinical setting. Electronically Signed   By: Dorise Bullion III M.D   On: 08/21/2016 17:38    EKG:  Orders placed or performed during the hospital encounter of 08/21/16  . ED EKG  . ED EKG  . EKG 12-Lead  . EKG 12-Lead  . ED EKG  . ED EKG  . EKG 12-Lead    ASSESSMENT AND PLAN:  Active Problems:   Upper GI bleed  #1. Acute diastolic CHF, due to dietary indiscretions, patient admitted of eating lots of soups recently, containing salt. Initiate patient on furosemide twice daily intravenously, following in's and outs closely, weight, wean off oxygen as tolerated. Echocardiogram is pending #2. Bradycardia, second degree heart block, per admitting physician TSH is normal, follow closely, get cardiologist involved recommendations, will need permanent pacemaker as the patient is symptomatic - has CHF #3. Leukocytosis of unclear etiology, get urine culture   #4. 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 6 intravenously twice a day, for EGD tomorrow, discussed with gastroenterologist   Management plans discussed with the patient, family and they are in agreement.   DRUG ALLERGIES: No Known Allergies  CODE STATUS:     Code Status Orders        Start     Ordered   08/21/16 2310  Full code  Continuous     08/21/16 2309    Code Status History    Date Active Date Inactive Code Status Order ID Comments User Context   08/11/2015 10:24 AM 08/12/2015 12:56 PM Full Code [id number]  Robert Bellow, MD Inpatient      TOTAL  TIME TAKING CARE OF THIS PATIENT:  35 inutes.    Theodoro Grist M.D on 08/22/2016 at 1:42 PM  Between 7am to 6pm - Pager - [phone]  After 6pm go to [email or web address] - password [email or web address] [facility]  Office  [phone]  CC: Primary care physician; Volanda Napoleon, MD

## 2016-08-23 ENCOUNTER — Inpatient Hospital Stay: Payer: Medicare Other

## 2016-08-23 ENCOUNTER — Encounter
Admission: EM | Disposition: A | Discharge status: Home Health | Payer: Self-pay | Source: Home / Self Care | Attending: Internal Medicine

## 2016-08-23 ENCOUNTER — Encounter: Payer: Self-pay | Admitting: Radiology

## 2016-08-23 LAB — GLUCOSE, CAPILLARY
GLUCOSE-CAPILLARY: 110 mg/dL — AB (ref 65–99)
GLUCOSE-CAPILLARY: 113 mg/dL — AB (ref 65–99)
GLUCOSE-CAPILLARY: 92 mg/dL (ref 65–99)
Glucose-Capillary: 116 mg/dL — ABNORMAL HIGH (ref 65–99)
Glucose-Capillary: 104 mg/dL — ABNORMAL HIGH (ref 65–99)
Glucose-Capillary: 114 mg/dL — ABNORMAL HIGH (ref 65–99)

## 2016-08-23 LAB — CBC
HCT: 28.1 % — ABNORMAL LOW (ref 40.0–52.0)
Hemoglobin: 9.1 g/dL — ABNORMAL LOW (ref 13.0–18.0)
MCH: 24.6 pg — AB (ref 26.0–34.0)
MCHC: 32.4 g/dL (ref 32.0–36.0)
MCV: 75.9 fL — ABNORMAL LOW (ref 80.0–100.0)
PLATELETS: 206 10*3/uL (ref 150–440)
RBC: 3.71 MIL/uL — AB (ref 4.40–5.90)
RDW: 25 % — ABNORMAL HIGH (ref 11.5–14.5)
WBC: 10.1 10*3/uL (ref 3.8–10.6)

## 2016-08-23 LAB — BASIC METABOLIC PANEL
ANION GAP: 8 (ref 5–15)
BUN: 19 mg/dL (ref 6–20)
CHLORIDE: 109 mmol/L (ref 101–111)
CO2: 21 mmol/L — ABNORMAL LOW (ref 22–32)
Calcium: 8.5 mg/dL — ABNORMAL LOW (ref 8.9–10.3)
Creatinine, Ser: 1.3 mg/dL — ABNORMAL HIGH (ref 0.61–1.24)
GFR calc Af Amer: >60 mL/min (ref >60)
GFR, EST NON AFRICAN AMERICAN: 52 mL/min — AB (ref >60)
GLUCOSE: 106 mg/dL — AB (ref 65–99)
POTASSIUM: 3.9 mmol/L (ref 3.5–5.1)
Sodium: 138 mmol/L (ref 135–145)

## 2016-08-23 LAB — MAGNESIUM: Magnesium: 1.6 mg/dL — ABNORMAL LOW (ref 1.7–2.4)

## 2016-08-23 LAB — BRAIN NATRIURETIC PEPTIDE: B NATRIURETIC PEPTIDE 5: 466 pg/mL — AB (ref 0.0–100.0)

## 2016-08-23 SURGERY — ESOPHAGOGASTRODUODENOSCOPY (EGD) WITH PROPOFOL
Anesthesia: General

## 2016-08-23 MED ORDER — PANTOPRAZOLE SODIUM 40 MG PO TBEC
40.0000 mg | DELAYED_RELEASE_TABLET | Freq: Two times a day (BID) | ORAL | Status: DC
  Administered 2016-08-23 – 2016-08-27 (×7): 40 mg via ORAL
  Filled 2016-08-23 (×7): qty 1

## 2016-08-23 MED ORDER — TECHNETIUM TC 99M DIETHYLENETRIAME-PENTAACETIC ACID
32.1160 | Freq: Once | INTRAVENOUS | Status: AC | PRN
  Administered 2016-08-23: 32.116 via RESPIRATORY_TRACT

## 2016-08-23 MED ORDER — MAGNESIUM SULFATE 4 GM/100ML IV SOLN
4.0000 g | Freq: Once | INTRAVENOUS | Status: AC
  Administered 2016-08-23: 4 g via INTRAVENOUS
  Filled 2016-08-23: qty 100

## 2016-08-23 MED ORDER — TECHNETIUM TO 99M ALBUMIN AGGREGATED
4.1280 | Freq: Once | INTRAVENOUS | Status: AC | PRN
  Administered 2016-08-23: 4.128 via INTRAVENOUS

## 2016-08-23 NOTE — Progress Notes (Signed)
The patient's procedures were canceled today due to the need for a pacemaker. The patient will have his pacemaker placed tomorrow. The patient will then be reassessed for further GI luminal evaluation.

## 2016-08-23 NOTE — Plan of Care (Signed)
Problem: Activity: Goal: Risk for activity intolerance will decrease Outcome: Progressing Pt ambulated in the hall 320 ft today

## 2016-08-23 NOTE — Consult Note (Signed)
[facility] Consultation Note  Patient ID: Omar [name], MRN: [id number], DOB/AGE: [birth year] 76 y.o. Admit date: 08/21/2016   Date of Consult: 08/23/2016 Primary Physician: Volanda Napoleon, MD Primary Cardiologist: Humphrey Rolls  Chief Complaint:  Chief Complaint  Patient presents with  . Shortness of Breath   Reason for Consult: progressive shortness of breath with heart block  HPI: 76 y.o. male with the known diabetes with complication essential hypertension makes hyperlipidemia history of atrial fibrillation currently in normal sinus rhythm with progressive shortness of breath with physical activity worsening in over the last several weeks and significantly worse at this time with an EKG showing 2-1 AV block. This 2-1 AV block is persistent despite heavy physical activity with a maximum heart rate of 70 bpm consistent with chronotropic incompetence and symptomatic bradycardia without current evidence of myocardial infarction and/or congestive heart failure. The patient was admitted for some GI bleed which is been remedied at this time with no further evidence of bleeding after discontinuation of aspirin and Plavix. The patient has not had received any beta blocker or calcium channel blocker causing any heart rate control. We have discussed that his 2-1 heart block with persistent symptomatic bradycardia and chronotropic incompetence would likely improve with dual-chamber pacemaker placement  Past Medical History:  Diagnosis Date  . Anxiety   . Arthritis    rheumatoid  . Atrial fibrillation (HCC)    hx of  . Colon polyp 07-07-15   TUBULAR ADENOMA WITH AT LEAST HIGH-GRADE / Dr Rayann Heman  . Diabetes mellitus without complication ([place])   . GERD (gastroesophageal reflux disease)   . Heart murmur   . Hypercholesterolemia   . Hypertension   . Prostate cancer ([place]) 01/01/13, 01/30/14   Gleason 3+4=7, volume 46.6 cc  . Rheumatoid arthritis ([place])   . S/P radiation therapy   04/03/2014 through 06/04/2014                                                      Prostate 7800 cGy in 40 sessions                          . Sleep apnea       Surgical History:  Past Surgical History:  Procedure Laterality Date  . ABDOMINAL AORTIC ANEURYSM REPAIR  11/2013  . COLON SURGERY  March 2017   Right hemicolectomy for tubulovillous adenoma with high-grade dysplasia.  . COLONOSCOPY WITH PROPOFOL N/A 07/07/2015   Procedure: COLONOSCOPY WITH PROPOFOL;  Surgeon: Josefine Class, MD;  Location: [facility] ENDOSCOPY;  Service: Endoscopy;  Laterality: N/A;  . LAPAROSCOPIC RIGHT COLECTOMY Right 08/08/2015   Procedure: LAPAROSCOPIC RIGHT COLECTOMY;  Surgeon: Robert Bellow, MD;  Location: [facility] ORS;  Service: General;  Laterality: Right;  . NASAL SINUS SURGERY    . PROSTATE BIOPSY  01/01/13, 01/30/14   Gleason 3+3=6, vol 46.6 cc  . TONSILLECTOMY    . uvula surgery     for sleep apnea     Home Meds: Prior to Admission medications   Medication Sig Start Date End Date Taking? Authorizing Provider  ALPRAZolam (XANAX) 0.25 MG tablet Take 0.25 mg by mouth 2 (two) times daily.   Yes Historical Provider, MD  amLODipine-benazepril (LOTREL) 5-20 MG per capsule Take 1 capsule by mouth daily.   Yes Historical  Provider, MD  aspirin EC 81 MG tablet Take 81 mg by mouth daily.   Yes Historical Provider, MD  atorvastatin (LIPITOR) 80 MG tablet Take 80 mg by mouth at bedtime.    Yes Historical Provider, MD  busPIRone (BUSPAR) 5 MG tablet Take 5 mg by mouth 2 (two) times daily.    Yes Historical Provider, MD  Butalbital-APAP-Caffeine 50-300-40 MG CAPS Take 1 capsule by mouth every 6 (six) hours as needed (for migraines).    Yes Historical Provider, MD  celecoxib (CELEBREX) 200 MG capsule Take 200 mg by mouth daily.    Yes Historical Provider, MD  cetirizine (ZYRTEC) 10 MG tablet Take 10 mg by mouth daily.   Yes Historical Provider, MD  clopidogrel (PLAVIX) 75 MG tablet Take 75 mg by mouth daily.   Yes  Historical Provider, MD  diphenoxylate-atropine (LOMOTIL) 2.5-0.025 MG tablet Take 1 tablet by mouth 4 (four) times daily as needed for diarrhea or loose stools.   Yes Historical Provider, MD  fluticasone (FLONASE) 50 MCG/ACT nasal spray Place 2 sprays into both nostrils daily as needed for rhinitis.    Yes Historical Provider, MD  Fluticasone Furoate-Vilanterol (BREO ELLIPTA) 100-25 MCG/INH AEPB Inhale 1 puff into the lungs daily.    Yes Historical Provider, MD  folic acid (FOLVITE) 1 MG tablet Take 1 mg by mouth daily.   Yes Historical Provider, MD  gabapentin (NEURONTIN) 100 MG capsule Take 100 mg by mouth at bedtime.   Yes Historical Provider, MD  isosorbide mononitrate (IMDUR) 30 MG 24 hr tablet Take 30 mg by mouth daily.   Yes Historical Provider, MD  metFORMIN (GLUCOPHAGE) 500 MG tablet Take 500 mg by mouth 2 (two) times daily with a meal.   Yes Historical Provider, MD  pantoprazole (PROTONIX) 40 MG tablet Take 40 mg by mouth every other day.    Yes Historical Provider, MD  triamcinolone cream (KENALOG) 0.1 % Apply 1 application topically 2 (two) times daily.   Yes Historical Provider, MD  CHERATUSSIN AC 100-10 MG/5ML syrup 4 (four) times daily as needed.  11/11/15   Historical Provider, MD  cyclobenzaprine (FLEXERIL) 5 MG tablet Take 5 mg by mouth 3 (three) times daily as needed for muscle spasms.    Historical Provider, MD  fexofenadine (ALLEGRA) 180 MG tablet Take 180 mg by mouth daily.    Historical Provider, MD  tamsulosin (FLOMAX) 0.4 MG CAPS capsule Take 1 capsule (0.4 mg total) by mouth daily. Patient not taking: Reported on 08/21/2016 06/03/14   Arloa Koh, MD    Inpatient Medications:  . ALPRAZolam  0.25 mg Oral BID  . atorvastatin  80 mg Oral QHS  . benazepril  20 mg Oral Daily  . busPIRone  5 mg Oral BID  . chlorpheniramine-HYDROcodone  5 mL Oral Q12H  . diphenhydrAMINE  25 mg Intravenous QHS  . fluticasone furoate-vilanterol  1 puff Inhalation Daily  . folic acid  1 mg  Oral Daily  . furosemide  20 mg Intravenous BID  . gabapentin  100 mg Oral QHS  . insulin aspart  0-20 Units Subcutaneous Q4H  . isosorbide mononitrate  30 mg Oral Daily  . loratadine  10 mg Oral Daily  . pantoprazole  40 mg Oral BID  . potassium chloride  20 mEq Oral BID  . sodium chloride flush  3 mL Intravenous Q12H  . triamcinolone cream   Topical BID   . sodium chloride 20 mL/hr at 08/22/16 1818    Allergies: No Known Allergies  Social History   Social History  . Marital status: Divorced    Spouse name: N/A  . Number of children: N/A  . Years of education: N/A   Occupational History  . Not on file.   Social History Main Topics  . Smoking status: Current Every Day Smoker    Packs/day: 2.00    Years: 50.00    Types: Cigarettes  . Smokeless tobacco: Never Used  . Alcohol use 0.6 oz/week    1 Cans of beer per week     Comment: occasionally  . Drug use: No  . Sexual activity: Not on file   Other Topics Concern  . Not on file   Social History Narrative  . No narrative on file     Family History  Problem Relation Age of Onset  . Heart attack Mother   . Cirrhosis Father   . Cancer Brother     pancreatic  . Diabetes Brother   . Diabetes Daughter     medication induced for cancer treatments  . Cancer Daughter     breast, brain     Review of Systems Positive for Shortness of breath cough congestion Negative for: General:  chills, fever, night sweats or weight changes.  Cardiovascular: PND orthopnea syncope dizziness  Dermatological skin lesions rashes Respiratory: Positive for Cough congestion Urologic: Frequent urination urination at night and hematuria Abdominal: negative for nausea, vomiting, diarrhea, bright red blood per rectum, positive for melena, or negative for hematemesis Neurologic: negative for visual changes, and/or hearing changes  All other systems reviewed and are otherwise negative except as noted above.  Labs:  Recent Labs   08/21/16 1912 08/21/16 2331 08/22/16 0447 08/22/16 1039  TROPONINI <0.03 <0.03 <0.03 <0.03   Lab Results  Component Value Date   WBC 10.1 08/23/2016   HGB 9.1 (L) 08/23/2016   HCT 28.1 (L) 08/23/2016   MCV 75.9 (L) 08/23/2016   PLT 206 08/23/2016    Recent Labs Lab 08/23/16 0333  NA 138  K 3.9  CL 109  CO2 21*  BUN 19  CREATININE 1.30*  CALCIUM 8.5*  GLUCOSE 106*   Lab Results  Component Value Date   CHOL 91 08/22/2016   HDL 27 (L) 08/22/2016   LDLCALC 51 08/22/2016   TRIG 67 08/22/2016   No results found for: DDIMER  Radiology/Studies:  Ct Abdomen Pelvis Wo Contrast  Result Date: 07/28/2016 CLINICAL DATA:  Follow up renal cysts, history of prostate cancer status post brachytherapy seed, prior colon resection for abnormal polyps, prior AAA repair EXAM: CT ABDOMEN AND PELVIS WITHOUT CONTRAST TECHNIQUE: Multidetector CT imaging of the abdomen and pelvis was performed following the standard protocol without IV contrast. COMPARISON:  CT abdomen/pelvis dated 05/2015 FINDINGS: Lower chest: Lung bases are clear. Hepatobiliary: Liver is within normal limits. Gallbladder is underdistended. No intrahepatic or extrahepatic ductal dilatation. Pancreas: Within normal limits. Spleen: Within normal limits. Adrenals/Urinary Tract: Adrenal glands are within normal limits. Multiple bilateral renal cysts, measuring up to 9.5 cm in the anterior right lower kidney (series 3/image 41), previously 8.5 cm. While technically incompletely evaluated in the absence of intravenous contrast, these measure simple fluid density. Multiple bilateral nonobstructing renal calculi measuring 2-3 mm. No ureteral or bladder calculi. No hydronephrosis. Bladder is mildly thick-walled although underdistended. Stomach/Bowel: Stomach is within normal limits. Status post right hemicolectomy with appendectomy. No evidence bowel obstruction. Sigmoid diverticulosis, without evidence diverticulitis. Vascular/Lymphatic: 4.7 x  4.4 cm infrarenal abdominal aortic aneurysm (series 2/image 50), previously 4.6 x 4.4  cm. Indwelling aorto bi-iliac stents. Atherosclerotic calcifications of the abdominal aorta and branch vessels. No suspicious abdominopelvic lymphadenopathy. Reproductive: Prostate is notable for brachytherapy seeds in this patient with known prostate cancer. Other: Small fat containing right inguinal hernia. Moderate left inguinal hernia containing fat and a loop of sigmoid colon (series 2/ image 84). No abdominopelvic ascites. Musculoskeletal: Mild degenerative changes of the lower lumbar spine. IMPRESSION: Multiple simple bilateral renal cysts measuring up to 9.5 cm in the right lower pole, mildly increased. Multiple bilateral nonobstructing renal calculi measuring 2-3 mm. No ureteral or bladder calculi. No hydronephrosis. Brachytherapy seeds in the prostate. No findings suspicious for metastatic disease. 1.7 cm infrarenal abdominal aortic aneurysm, grossly unchanged. Status post aorto bi-iliac stent. Status post right hemicolectomy with appendectomy. No evidence bowel obstruction. Moderate left inguinal hernia containing fat and a loop of sigmoid colon. Additional ancillary findings as above. Electronically Signed   By: Julian Hy M.D.   On: 07/28/2016 09:18   Dg Chest 2 View  Result Date: 08/21/2016 CLINICAL DATA:  Shortness of breath, worsened today. Cough for 3 weeks. EXAM: CHEST  2 VIEW COMPARISON:  May 27 1,015 FINDINGS: Increased interstitial markings in the lungs with small bilateral pleural effusions. No pneumothorax. Cardiomegaly. The hila and mediastinum are unchanged. No other acute interval changes. IMPRESSION: The findings are most consistent with mild edema and small effusions. Atypical infection could have a similar appearance in the appropriate clinical setting. Electronically Signed   By: Dorise Bullion III M.D   On: 08/21/2016 17:38   Dg Abd 1 View  Result Date: 08/23/2016 CLINICAL DATA:   Abdominal pain. EXAM: ABDOMEN - 1 VIEW COMPARISON:  CT 07/27/2016 . FINDINGS: Soft tissue structures are unremarkable. No bowel distention. Aortoiliac stent graft noted. Surgical clips in the pelvis. Degenerative changes lumbar spine and both hips. IMPRESSION: 1. No acute abnormality. 2. Aortoiliac stent graft. Electronically Signed   By: Marcello Moores  Register   On: 08/23/2016 12:01    EKG: Normal sinus rhythm with 2 to one ventricular block  Weights: Filed Weights   08/21/16 1702 08/21/16 2309 08/23/16 0500  Weight: 106.1 kg (234 lb) 106.3 kg (234 lb 4.8 oz) 103.2 kg (227 lb 9.6 oz)     Physical Exam: Blood pressure (!) 104/53, pulse (!) 47, temperature 97.6 F (36.4 C), temperature source Oral, resp. rate 20, height 5\' 11"  (1.803 m), weight 103.2 kg (227 lb 9.6 oz), SpO2 95 %. Body mass index is 31.74 kg/m. General: Well developed, well nourished, in no acute distress. Head eyes ears nose throat: Normocephalic, atraumatic, sclera non-icteric, no xanthomas, nares are without discharge. No apparent thyromegaly and/or mass  Lungs: Normal respiratory effort.  no wheezes, no rales, no rhonchi.  Heart: RRR with normal S1 S2. no murmur gallop, no rub, PMI is normal size and placement, carotid upstroke normal without bruit, jugular venous pressure is normal Abdomen: Soft, non-tender, non-distended with normoactive bowel sounds. No hepatomegaly. No rebound/guarding. No obvious abdominal masses. Abdominal aorta is normal size without bruit Extremities: Trace to 1+ edema. no cyanosis, no clubbing, no ulcers  Peripheral : 2+ bilateral upper extremity pulses, 2+ bilateral femoral pulses, 2+ bilateral dorsal pedal pulse Neuro: Alert and oriented. No facial asymmetry. No focal deficit. Moves all extremities spontaneously. Musculoskeletal: Normal muscle tone without kyphosis Psych:  Responds to questions appropriately with a normal affect.    Assessment: 76 year old male with essential hypertension makes  hyperlipidemia diabetes with complication with 2-1 heart block with symptomatic bradycardia and chronotropic incompetence  Plan:  1. Proceed to dual-chamber pacemaker placement for her significant symptomatic bradycardia and chronotropic incompetence. The patient understands the risk and benefits of cardiac pacemaker placement. This includes a possibility of death stroke heart attack infection bleeding blood clot hemopericardium and/or pneumothorax. The patient is at low risk for general anesthesia  Signed, Corey Skains M.D. [facility] 08/23/2016, 5:45 PM

## 2016-08-23 NOTE — Progress Notes (Signed)
[facility] NAME: Omar [name]    MR#:  [id number]  DATE OF BIRTH:  19-Jan-1941  SUBJECTIVE:  CHIEF COMPLAINT:   Chief Complaint  Patient presents with  . Shortness of Breath   The patient is 76 year old male with past medical history significant for history of anxiety, rheumatoid arthritis, A. fib, diabetes, hypertension, hyperlipidemia, gastroesophageal reflux disease, prostate cancer, obstructive sleep apnea, who presents to the hospital with complaints of shortness of breath. He also complained of some black stools as well as bright red blood per rectum. He was noted to have lower extremity edema, his chest x-ray was concerning for congestive heart failure, he was admitted for IV Lasix. The patient was diuresed about 210 cc over his stay in the hospital time, he feels somewhat improved, still requires 2 L of oxygen through nasal cannula, not on oxygen at home. Patient feels poorly today, feels weak, somewhat dizzy, very short of breath especially when moves around, unable to sleep in the bed, change to recliner where he stayed all night long. Telemetry revealed third-degree AV block. Discussed with cardiologist, permanent pacemaker placement was recommended. EGD is canceled for today. No gastrointestinal bleeding reported   Review of Systems  Constitutional: Positive for malaise/fatigue. Negative for chills, fever and weight loss.  HENT: Negative for congestion.   Eyes: Negative for blurred vision and double vision.  Respiratory: Positive for cough, sputum production, shortness of breath and wheezing.   Cardiovascular: Positive for leg swelling and PND. Negative for chest pain, palpitations and orthopnea.  Gastrointestinal: Negative for abdominal pain, blood in stool, constipation, diarrhea, nausea and vomiting.  Genitourinary: Negative for dysuria, frequency, hematuria and urgency.  Musculoskeletal: Negative for falls.    Neurological: Positive for dizziness. Negative for tremors, focal weakness and headaches.  Endo/Heme/Allergies: Does not bruise/bleed easily.  Psychiatric/Behavioral: Negative for depression. The patient does not have insomnia.     VITAL SIGNS: Blood pressure (!) 104/53, pulse (!) 47, temperature 97.6 F (36.4 C), temperature source Oral, resp. rate 20, height 5\' 11"  (1.803 m), weight 103.2 kg (227 lb 9.6 oz), SpO2 95 %.  PHYSICAL EXAMINATION:   GENERAL:  76 y.o.-year-old patient lying in the chair in mild-to-moderate respiratory distress, tachypneic, uncomfortable, intermittently using accessory muscles to breathe, sluggish, uncomfortable overall EYES: Pupils equal, round, reactive to light and accommodation. No scleral icterus. Extraocular muscles intact.  HEENT: Head atraumatic, normocephalic. Oropharynx and nasopharynx clear.  NECK:  Supple, no jugular venous distention. No thyroid enlargement, no tenderness.  LUNGS: Some diminished breath sounds bilaterally, scattered wheezing, rales,rhonchi , bilateral posterior half lower lung field crepitations. Intermittent use of accessory muscles of respiration.  CARDIOVASCULAR: S1, S2 , rhythm is regular. No murmurs, rubs, or gallops.  ABDOMEN: Soft, nontender, nondistended. Bowel sounds present. No organomegaly or mass.  EXTREMITIES: 2+ lower extremity and pedal edema, no cyanosis, or clubbing.  NEUROLOGIC: Cranial nerves II through XII are intact. Muscle strength 5/5 in all extremities. Sensation intact. Gait not checked.  PSYCHIATRIC: The patient is alert and oriented x 3.  SKIN: No obvious rash, lesion, or ulcer.   ORDERS/RESULTS REVIEWED:   CBC  Recent Labs Lab 08/21/16 1912 08/22/16 0447 08/23/16 0333  WBC 10.3 10.8* 10.1  HGB 9.0* 9.2* 9.1*  HCT 29.1* 29.1* 28.1*  PLT 221 210 206  MCV 77.1* 76.4* 75.9*  MCH 24.0* 24.1* 24.6*  MCHC 31.1* 31.6* 32.4  RDW 24.6* 24.9* 25.0*  LYMPHSABS 0.9*  --   --  MONOABS 0.8  --   --    EOSABS 0.4  --   --   BASOSABS 0.1  --   --    ------------------------------------------------------------------------------------------------------------------  Chemistries   Recent Labs Lab 08/21/16 1912 08/22/16 0447 08/23/16 0333  NA 141 140 138  K 3.9 4.0 3.9  CL 115* 115* 109  CO2 20* 20* 21*  GLUCOSE 129* 107* 106*  BUN 16 15 19   CREATININE 1.21 1.18 1.30*  CALCIUM 8.5* 8.4* 8.5*  MG  --   --  1.6*   ------------------------------------------------------------------------------------------------------------------ estimated creatinine clearance is 61 mL/min (A) (by C-G formula based on SCr of 1.3 mg/dL (H)). ------------------------------------------------------------------------------------------------------------------  Recent Labs  08/21/16 2331  TSH 1.290    Cardiac Enzymes  Recent Labs Lab 08/21/16 2331 08/22/16 0447 08/22/16 1039  TROPONINI <0.03 <0.03 <0.03   ------------------------------------------------------------------------------------------------------------------ Invalid input(s): POCBNP ---------------------------------------------------------------------------------------------------------------  RADIOLOGY: Dg Chest 2 View  Result Date: 08/21/2016 CLINICAL DATA:  Shortness of breath, worsened today. Cough for 3 weeks. EXAM: CHEST  2 VIEW COMPARISON:  May 27 1,015 FINDINGS: Increased interstitial markings in the lungs with small bilateral pleural effusions. No pneumothorax. Cardiomegaly. The hila and mediastinum are unchanged. No other acute interval changes. IMPRESSION: The findings are most consistent with mild edema and small effusions. Atypical infection could have a similar appearance in the appropriate clinical setting. Electronically Signed   By: Dorise Bullion III M.D   On: 08/21/2016 17:38   Dg Abd 1 View  Result Date: 08/23/2016 CLINICAL DATA:  Abdominal pain. EXAM: ABDOMEN - 1 VIEW COMPARISON:  CT 07/27/2016 . FINDINGS: Soft  tissue structures are unremarkable. No bowel distention. Aortoiliac stent graft noted. Surgical clips in the pelvis. Degenerative changes lumbar spine and both hips. IMPRESSION: 1. No acute abnormality. 2. Aortoiliac stent graft. Electronically Signed   By: Marcello Moores  Register   On: 08/23/2016 12:01    EKG:  Orders placed or performed during the hospital encounter of 08/21/16  . ED EKG  . ED EKG  . EKG 12-Lead  . EKG 12-Lead  . ED EKG  . ED EKG  . EKG 12-Lead    ASSESSMENT AND PLAN:  Active Problems:   Upper GI bleed  #1. Acute diastolic CHF, due to dietary indiscretions, fluctuating second type I to third-degree AV block, continue furosemide twice daily intravenously, negative about 1.8 L since admission,  weaned off oxygen as tolerated. Echocardiogram revealed a right sided failure, moderate tricuspid regurgitation, normal ejection fraction. Discussed with cardiologist about permanent pacemaker placement. I recommend it to be placed due to intermittent third-degree AV block, according to telemetry strips.Get V/Q to rule out pulmonary embolism.   #2. Bradycardia, second type 1 and intermittent third degree heart block,  TSH is normal, needs permanent pacemaker as the patient is symptomatic - has CHF, generalized weakness, sluggish, unable to sleep in bed due to orthopnea.  #3. Leukocytosis of unclear etiology, awaiting for urine culture , urinalysis was unremarkable  #4. Diabetes, hemoglobin A1c is not done yet, continue outpatient medications, sliding scale insulin   #5, rectal bleed, likely due to radiation proctitis, per gastroenterologist, however, patient admited of having some black stools, continue PPI twice a day, for EGD after  a permanent pacemaker is placed #6. Iron deficiency anemia, no need to transfuse at this time, although patient did have about 2 g hemoglobin drop since fourth of March 2017/in one year #7. Right-sided heart failure, getting VQ scan to rule out pulmonary  embolism Management plans discussed with the patient, family  and they are in agreement.   DRUG ALLERGIES: No Known Allergies  CODE STATUS:     Code Status Orders        Start     Ordered   08/21/16 2310  Full code  Continuous     08/21/16 2309    Code Status History    Date Active Date Inactive Code Status Order ID Comments User Context   08/11/2015 10:24 AM 08/12/2015 12:56 PM Full Code [id number]  Robert Bellow, MD Inpatient      TOTAL TIME TAKING CARE OF THIS PATIENT:  35 minutes.   Discussed with cardiologist Raeleen Winstanley M.D on 08/23/2016 at 2:49 PM  Between 7am to 6pm - Pager - [phone]  After 6pm go to [email or web address] - password [email or web address]  Office  [phone]  CC: Primary care physician; Volanda Napoleon, MD

## 2016-08-23 NOTE — Progress Notes (Signed)
SUBJECTIVE: Patient is feeling much better but got short of breath and was admitted to the hospital and was found to be in GI bleed   Vitals:   08/22/16 1156 08/22/16 2042 08/23/16 0424 08/23/16 0500  BP: (!) 145/63 101/72 (!) 105/50   Pulse: (!) 50 (!) 54 (!) 53   Resp:  20 17   Temp: 97.7 F (36.5 C) 97.9 F (36.6 C) 97.8 F (36.6 C)   TempSrc: Oral Oral Oral   SpO2: 100% 100% 93%   Weight:    227 lb 9.6 oz (103.2 kg)  Height:        Intake/Output Summary (Last 24 hours) at 08/23/16 0853 Last data filed at 08/23/16 0332  Gross per 24 hour  Intake           784.67 ml  Output             2100 ml  Net         -1315.33 ml    LABS: Basic Metabolic Panel:  Recent Labs  08/22/16 0447 08/23/16 0333  NA 140 138  K 4.0 3.9  CL 115* 109  CO2 20* 21*  GLUCOSE 107* 106*  BUN 15 19  CREATININE 1.18 1.30*  CALCIUM 8.4* 8.5*  MG  --  1.6*   Liver Function Tests: No results for input(s): AST, ALT, ALKPHOS, BILITOT, PROT, ALBUMIN in the last 72 hours. No results for input(s): LIPASE, AMYLASE in the last 72 hours. CBC:  Recent Labs  08/21/16 1912 08/22/16 0447 08/23/16 0333  WBC 10.3 10.8* 10.1  NEUTROABS 8.2*  --   --   HGB 9.0* 9.2* 9.1*  HCT 29.1* 29.1* 28.1*  MCV 77.1* 76.4* 75.9*  PLT 221 210 206   Cardiac Enzymes:  Recent Labs  08/21/16 2331 08/22/16 0447 08/22/16 1039  TROPONINI <0.03 <0.03 <0.03   BNP: Invalid input(s): POCBNP D-Dimer: No results for input(s): DDIMER in the last 72 hours. Hemoglobin A1C: No results for input(s): HGBA1C in the last 72 hours. Fasting Lipid Panel:  Recent Labs  08/22/16 0447  CHOL 91  HDL 27*  LDLCALC 51  TRIG 67  CHOLHDL 3.4   Thyroid Function Tests:  Recent Labs  08/21/16 2331  TSH 1.290   Anemia Panel:  Recent Labs  08/21/16 1912  FERRITIN 7*     PHYSICAL EXAM General: Well developed, well nourished, in no acute distress HEENT:  Normocephalic and atramatic Neck:  No JVD.  Lungs: Clear  bilaterally to auscultation and percussion. Heart: HRRR . Normal S1 and S2 without gallops or murmurs.  Abdomen: Bowel sounds are positive, abdomen soft and non-tender  Msk:  Back normal, normal gait. Normal strength and tone for age. Extremities: No clubbing, cyanosis or edema.   Neuro: Alert and oriented X 3. Psych:  Good affect, responds appropriately  TELEMETRY:Type I Mobitz 1 AV block which is not new and has been tolerating.  ASSESSMENT AND PLAN: GI bleed with shortness of breath with history of Mobitz 1 second-degree AV block which is chronic and has been asymptomatic be because of it. Advise proceeding with EGD and colonoscopy for GI bleed.  Active Problems:   Upper GI bleed    Dionisio David, MD, [facility] 08/23/2016 8:53 AM

## 2016-08-24 LAB — BASIC METABOLIC PANEL
ANION GAP: 7 (ref 5–15)
BUN: 22 mg/dL — ABNORMAL HIGH (ref 6–20)
CALCIUM: 8.8 mg/dL — AB (ref 8.9–10.3)
CO2: 22 mmol/L (ref 22–32)
Chloride: 108 mmol/L (ref 101–111)
Creatinine, Ser: 1.58 mg/dL — ABNORMAL HIGH (ref 0.61–1.24)
GFR, EST AFRICAN AMERICAN: 48 mL/min — AB (ref >60)
GFR, EST NON AFRICAN AMERICAN: 41 mL/min — AB (ref >60)
Glucose, Bld: 105 mg/dL — ABNORMAL HIGH (ref 65–99)
Potassium: 3.8 mmol/L (ref 3.5–5.1)
SODIUM: 137 mmol/L (ref 135–145)

## 2016-08-24 LAB — GLUCOSE, CAPILLARY
GLUCOSE-CAPILLARY: 108 mg/dL — AB (ref 65–99)
GLUCOSE-CAPILLARY: 93 mg/dL (ref 65–99)
GLUCOSE-CAPILLARY: 106 mg/dL — AB (ref 65–99)
Glucose-Capillary: 104 mg/dL — ABNORMAL HIGH (ref 65–99)
Glucose-Capillary: 111 mg/dL — ABNORMAL HIGH (ref 65–99)
Glucose-Capillary: 117 mg/dL — ABNORMAL HIGH (ref 65–99)
Glucose-Capillary: 128 mg/dL — ABNORMAL HIGH (ref 65–99)

## 2016-08-24 LAB — HEMOGLOBIN A1C
Hgb A1c MFr Bld: 6.1 % — ABNORMAL HIGH (ref 4.8–5.6)
Mean Plasma Glucose: 128 mg/dL

## 2016-08-24 LAB — HEMOGLOBIN: Hemoglobin: 9.9 g/dL — ABNORMAL LOW (ref 13.0–18.0)

## 2016-08-24 LAB — SURGICAL PCR SCREEN
MRSA, PCR: NEGATIVE
STAPHYLOCOCCUS AUREUS: NEGATIVE

## 2016-08-24 MED ORDER — SODIUM CHLORIDE 0.9 % IR SOLN
80.0000 mg | Status: AC
  Filled 2016-08-24: qty 2

## 2016-08-24 MED ORDER — CEFAZOLIN SODIUM-DEXTROSE 2-4 GM/100ML-% IV SOLN
2.0000 g | INTRAVENOUS | Status: AC
  Filled 2016-08-24: qty 100

## 2016-08-24 MED ORDER — FUROSEMIDE 40 MG PO TABS: 40.0000 mg | ORAL_TABLET | Freq: Every day | ORAL | Status: DC

## 2016-08-24 NOTE — Progress Notes (Signed)
SUBJECTIVE: Pt is feeling well. No longer on oxygen, but reports shortness of breath with exertion.    Vitals:   08/23/16 0500 08/23/16 1236 08/23/16 2019 08/24/16 0434  BP:  (!) 104/53 111/66 (!) 121/56  Pulse:  (!) 47 (!) 57 (!) 52  Resp:  20 17 16   Temp:  97.6 F (36.4 C) 98.3 F (36.8 C) 98.1 F (36.7 C)  TempSrc:  Oral Oral Oral  SpO2:  95% 98% 95%  Weight: 227 lb 9.6 oz (103.2 kg)   235 lb 14.4 oz (107 kg)  Height:        Intake/Output Summary (Last 24 hours) at 08/24/16 0920 Last data filed at 08/24/16 0606  Gross per 24 hour  Intake           716.71 ml  Output             1850 ml  Net         -1133.29 ml    LABS: Basic Metabolic Panel:  Recent Labs  08/22/16 0447 08/23/16 0333  NA 140 138  K 4.0 3.9  CL 115* 109  CO2 20* 21*  GLUCOSE 107* 106*  BUN 15 19  CREATININE 1.18 1.30*  CALCIUM 8.4* 8.5*  MG  --  1.6*   Liver Function Tests: No results for input(s): AST, ALT, ALKPHOS, BILITOT, PROT, ALBUMIN in the last 72 hours. No results for input(s): LIPASE, AMYLASE in the last 72 hours. CBC:  Recent Labs  08/21/16 1912 08/22/16 0447 08/23/16 0333  WBC 10.3 10.8* 10.1  NEUTROABS 8.2*  --   --   HGB 9.0* 9.2* 9.1*  HCT 29.1* 29.1* 28.1*  MCV 77.1* 76.4* 75.9*  PLT 221 210 206   Cardiac Enzymes:  Recent Labs  08/21/16 2331 08/22/16 0447 08/22/16 1039  TROPONINI <0.03 <0.03 <0.03   BNP: Invalid input(s): POCBNP D-Dimer: No results for input(s): DDIMER in the last 72 hours. Hemoglobin A1C:  Recent Labs  08/23/16 0333  HGBA1C 6.1*   Fasting Lipid Panel:  Recent Labs  08/22/16 0447  CHOL 91  HDL 27*  LDLCALC 51  TRIG 67  CHOLHDL 3.4   Thyroid Function Tests:  Recent Labs  08/21/16 2331  TSH 1.290   Anemia Panel:  Recent Labs  08/21/16 1912  FERRITIN 7*     PHYSICAL EXAM General: Well developed, well nourished, in no acute distress HEENT:  Normocephalic and atramatic Neck:  No JVD.  Lungs: Clear bilaterally to  auscultation and percussion. Heart: HRRR . Normal S1 and S2 without gallops or murmurs.  Abdomen: Bowel sounds are positive, abdomen soft and non-tender . Extremities: No clubbing, cyanosis or edema.   Neuro: Alert and oriented X 3. Psych:  Good affect, responds appropriately   Telemetry: 3rd degree AV block, ventricular rate 45- 57bpm  ASSESSMENT AND PLAN: Dr. Nehemiah Massed consulted for Dr. Saralyn Pilar to proceed with dual chamber pacemaker placement. Pt's last Plavix was 3-4 days ago per patient's recollection.  Active Problems:   Upper GI bleed    Jake Bathe, NP-C 08/24/2016 9:20 AM

## 2016-08-24 NOTE — Progress Notes (Signed)
[place] [facility] [place] NAME: Omar [name]    MR#:  [id number]  DATE OF BIRTH:  May 30, 1941  SUBJECTIVE:  CHIEF COMPLAINT:   Chief Complaint  Patient presents with  . Shortness of Breath   The patient is 76 year old male with past medical history significant for history of anxiety, rheumatoid arthritis, A. fib, diabetes, hypertension, hyperlipidemia, gastroesophageal reflux disease, prostate cancer, obstructive sleep apnea, who presents to the hospital with complaints of shortness of breath. He also complained of some black stools as well as bright red blood per rectum. He was noted to have lower extremity edema, his chest x-ray was concerning for congestive heart failure, he was admitted for IV Lasix. The patient was diuresed about 210 cc over his stay in the hospital time, he feels somewhat improved, still requires 2 L of oxygen through nasal cannula, not on oxygen at home. The patient feels somewhat better today, for permanent pacemaker placement on Wednesday or Thursday by Dr. Saralyn Pilar. EGD was canceled. The patient is considered to have significant chronotropic incompetence by cardiologist, recommended to keep him in the hospital for permanent pacemaker placement   Review of Systems  Constitutional: Positive for malaise/fatigue. Negative for chills, fever and weight loss.  HENT: Negative for congestion.   Eyes: Negative for blurred vision and double vision.  Respiratory: Positive for cough, sputum production, shortness of breath and wheezing.   Cardiovascular: Positive for leg swelling and PND. Negative for chest pain, palpitations and orthopnea.  Gastrointestinal: Negative for abdominal pain, blood in stool, constipation, diarrhea, nausea and vomiting.  Genitourinary: Negative for dysuria, frequency, hematuria and urgency.  Musculoskeletal: Negative for falls.  Neurological: Positive for dizziness. Negative for tremors, focal  weakness and headaches.  Endo/Heme/Allergies: Does not bruise/bleed easily.  Psychiatric/Behavioral: Negative for depression. The patient does not have insomnia.     VITAL SIGNS: Blood pressure 118/60, pulse (!) 58, temperature 98.2 F (36.8 C), temperature source Oral, resp. rate 16, height 5\' 11"  (1.803 m), weight 107 kg (235 lb 14.4 oz), SpO2 95 %.  PHYSICAL EXAMINATION:   GENERAL:  76 y.o.-year-old patient sitting in the chair relatively more comfortable, not in significant respiratory distress  EYES: Pupils equal, round, reactive to light and accommodation. No scleral icterus. Extraocular muscles intact.  HEENT: Head atraumatic, normocephalic. Oropharynx and nasopharynx clear.  NECK:  Supple, no jugular venous distention. No thyroid enlargement, no tenderness.  LUNGS: Some diminished breath sounds bilaterally, no wheezing, rales,rhonchi , few crepitations. Intermittent use of accessory muscles of respiration.  CARDIOVASCULAR: S1, S2 , rhythm is regular. No murmurs, rubs, or gallops.  ABDOMEN: Soft, nontender, nondistended. Bowel sounds present. No organomegaly or mass.  EXTREMITIES: 2+ lower extremity and pedal edema, no cyanosis, or clubbing.  NEUROLOGIC: Cranial nerves II through XII are intact. Muscle strength 5/5 in all extremities. Sensation intact. Gait not checked.  PSYCHIATRIC: The patient is alert and oriented x 3.  SKIN: No obvious rash, lesion, or ulcer.   ORDERS/RESULTS REVIEWED:   CBC  Recent Labs Lab 08/21/16 1912 08/22/16 0447 08/23/16 0333 08/24/16 1333  WBC 10.3 10.8* 10.1  --   HGB 9.0* 9.2* 9.1* 9.9*  HCT 29.1* 29.1* 28.1*  --   PLT 221 210 206  --   MCV 77.1* 76.4* 75.9*  --   MCH 24.0* 24.1* 24.6*  --   MCHC 31.1* 31.6* 32.4  --   RDW 24.6* 24.9* 25.0*  --   LYMPHSABS 0.9*  --   --   --  MONOABS 0.8  --   --   --   EOSABS 0.4  --   --   --   BASOSABS 0.1  --   --   --     ------------------------------------------------------------------------------------------------------------------  Chemistries   Recent Labs Lab 08/21/16 1912 08/22/16 0447 08/23/16 0333 08/24/16 1333  NA 141 140 138 137  K 3.9 4.0 3.9 3.8  CL 115* 115* 109 108  CO2 20* 20* 21* 22  GLUCOSE 129* 107* 106* 105*  BUN 16 15 19  22*  CREATININE 1.21 1.18 1.30* 1.58*  CALCIUM 8.5* 8.4* 8.5* 8.8*  MG  --   --  1.6*  --    ------------------------------------------------------------------------------------------------------------------ estimated creatinine clearance is 51.1 mL/min (A) (by C-G formula based on SCr of 1.58 mg/dL (H)). ------------------------------------------------------------------------------------------------------------------  Recent Labs  08/21/16 2331  TSH 1.290    Cardiac Enzymes  Recent Labs Lab 08/21/16 2331 08/22/16 0447 08/22/16 1039  TROPONINI <0.03 <0.03 <0.03   ------------------------------------------------------------------------------------------------------------------ Invalid input(s): POCBNP ---------------------------------------------------------------------------------------------------------------  RADIOLOGY: Dg Chest 2 View  Result Date: 08/23/2016 CLINICAL DATA:  Shortness of breath and weakness EXAM: CHEST  2 VIEW COMPARISON:  08/21/2016 FINDINGS: Cardiac shadow is at the upper limits of normal in size. The lungs are well aerated bilaterally. Small effusions are again identified. Mild central vascular congestion remains. No focal infiltrate is seen. IMPRESSION: Mild CHF with small effusions. Electronically Signed   By: Inez Catalina M.D.   On: 08/23/2016 19:47   Dg Abd 1 View  Result Date: 08/23/2016 CLINICAL DATA:  Abdominal pain. EXAM: ABDOMEN - 1 VIEW COMPARISON:  CT 07/27/2016 . FINDINGS: Soft tissue structures are unremarkable. No bowel distention. Aortoiliac stent graft noted. Surgical clips in the pelvis. Degenerative  changes lumbar spine and both hips. IMPRESSION: 1. No acute abnormality. 2. Aortoiliac stent graft. Electronically Signed   By: Marcello Moores  Register   On: 08/23/2016 12:01   Nm Pulmonary Perf And Vent  Result Date: 08/23/2016 CLINICAL DATA:  Shortness of breath x1 week, asthma EXAM: NUCLEAR MEDICINE VENTILATION - PERFUSION LUNG SCAN TECHNIQUE: Ventilation images were obtained in multiple projections using inhaled aerosol Tc-29m DTPA. Perfusion images were obtained in multiple projections after intravenous injection of Tc-50m MAA. RADIOPHARMACEUTICALS:  32.1 mCi Technetium-66m DTPA aerosol inhalation and 4.1 mCi Technetium-43m MAA IV COMPARISON:  None. FINDINGS: Ventilation: No focal ventilation defect. Mild clumping in the central airways. Perfusion: No wedge shaped peripheral perfusion defects to suggest acute pulmonary embolism. IMPRESSION: Negative for pulmonary embolism. Electronically Signed   By: Julian Hy M.D.   On: 08/23/2016 19:47    EKG:  Orders placed or performed during the hospital encounter of 08/21/16  . ED EKG  . ED EKG  . EKG 12-Lead  . EKG 12-Lead  . ED EKG  . ED EKG  . EKG 12-Lead    ASSESSMENT AND PLAN:  Active Problems:   Upper GI bleed  #1. Acute diastolic CHF, due to dietary indiscretions, fluctuating second type I to high-grade AV block, continue furosemide twice daily intravenously, negative about 3.8 L since admission,  weaned off oxygen. Echocardiogram revealed a right sided failure, moderate tricuspid regurgitation, normal ejection fraction. Discussed with cardiologist about permanent pacemaker placement. The patient is considered to have significant chronotropic incompetence requiring him to stay in the hospital for permanent pacemaker placement, which will be performed Wednesday or Thursday due to Plavix used on Saturday. VQ scan was low probability for pulmonary embolism.   #2. Bradycardia, second type 1 and intermittent high grade heart block,  TSH was  normal, vision is to undergo permanent pacemaker placement due to chronotropic incompetence , patient is symptomatic - has CHF, generalized weakness, sluggishness, unable to sleep in bed due to orthopnea. Heart rate has improved somewhat with diuresis #3. Leukocytosis of unclear etiology,  urinalysis was unremarkable. Leukocytosis has resolved  #4. Diabetes, hemoglobin A1c was 6.1,  continue sliding scale insulin   #5, rectal bleed, likely due to radiation proctitis, per gastroenterologist, however, patient admited of having some black stools, continue PPI twice a day, for EGD after  a permanent pacemaker is placed #6. Iron deficiency anemia, no need to transfuse at this time, although patient did have about 2 g hemoglobin drop since fourth of March 2017/in one year, hemoglobin level remains stable #7. Right-sided heart failure, VQ scan was low probably for pulmonary embolism   Management plans discussed with the patient, family and they are in agreement.   DRUG ALLERGIES: No Known Allergies  CODE STATUS:     Code Status Orders        Start     Ordered   08/21/16 2310  Full code  Continuous     08/21/16 2309    Code Status History    Date Active Date Inactive Code Status Order ID Comments User Context   08/11/2015 10:24 AM 08/12/2015 12:56 PM Full Code [id number]  Robert Bellow, MD Inpatient      TOTAL TIME TAKING CARE OF THIS PATIENT:  35 minutes.   Discussed with cardiologist Dominque Marlin M.D on 08/24/2016 at 3:41 PM  Between 7am to 6pm - Pager - [phone]  After 6pm go to [email or web address] - password [email or web address] [facility]  Office  [phone]  CC: Primary care physician; Volanda Napoleon, MD

## 2016-08-24 NOTE — Care Management (Signed)
Patient admitted with CHF, rectal bleed, and bradycardia.  Patient to have pacemaker placed prior to EGD.  Patients states that he lives at home with a "friend".  PCP Tejanse.  Patient states that at baseline he is independent, and still drives.  Patient denies having any medical equipment in the home.  PT has assessed patient and recommended home health PT and RW.  Patient provided home health agency preference.  Patient hesitant to agree to services, after detailed explanation of services patient agreeable to PT and RN if indicated at time of discharge.  Patient states that he does not have a choice of home health agency.  Heads up referral made to Sarah with [facility].  Patient will need RW prior to discharge.  Corene Cornea with [facility] notified for DME.

## 2016-08-24 NOTE — Plan of Care (Signed)
Problem: Bowel/Gastric: Goal: Will not experience complications related to bowel motility Outcome: Progressing Patient is on a soft diet

## 2016-08-24 NOTE — Care Management Important Message (Signed)
Important Message  Patient Details  Name: Omar [name] MRN: [id number] Date of Birth: 02/05/1941   Medicare Important Message Given:  Yes    Beverly Sessions, RN 08/24/2016, 3:03 PM

## 2016-08-25 LAB — GLUCOSE, CAPILLARY
GLUCOSE-CAPILLARY: 142 mg/dL — AB (ref 65–99)
Glucose-Capillary: 117 mg/dL — ABNORMAL HIGH (ref 65–99)
Glucose-Capillary: 114 mg/dL — ABNORMAL HIGH (ref 65–99)
Glucose-Capillary: 100 mg/dL — ABNORMAL HIGH (ref 65–99)
Glucose-Capillary: 93 mg/dL (ref 65–99)

## 2016-08-25 LAB — CREATININE, SERUM
Creatinine, Ser: 1.65 mg/dL — ABNORMAL HIGH (ref 0.61–1.24)
GFR calc Af Amer: 46 mL/min — ABNORMAL LOW (ref >60)
GFR calc non Af Amer: 39 mL/min — ABNORMAL LOW (ref >60)

## 2016-08-25 MED ORDER — MAGNESIUM OXIDE 400 (241.3 MG) MG PO TABS
400.0000 mg | ORAL_TABLET | Freq: Every day | ORAL | Status: AC
  Administered 2016-08-25: 400 mg via ORAL
  Filled 2016-08-25: qty 1

## 2016-08-25 MED ORDER — SENNA 8.6 MG PO TABS
1.0000 | ORAL_TABLET | Freq: Every day | ORAL | Status: DC
  Administered 2016-08-25 – 2016-08-27 (×2): 8.6 mg via ORAL
  Filled 2016-08-25 (×2): qty 1

## 2016-08-25 MED ORDER — FUROSEMIDE 40 MG PO TABS
40.0000 mg | ORAL_TABLET | Freq: Two times a day (BID) | ORAL | Status: DC
  Administered 2016-08-25 – 2016-08-27 (×3): 40 mg via ORAL
  Filled 2016-08-25 (×3): qty 1

## 2016-08-25 MED ORDER — POLYETHYLENE GLYCOL 3350 17 G PO PACK
17.0000 g | PACK | Freq: Every day | ORAL | Status: DC | PRN
  Administered 2016-08-25: 17 g via ORAL
  Filled 2016-08-25: qty 1

## 2016-08-25 MED ORDER — DOCUSATE SODIUM 100 MG PO CAPS
100.0000 mg | ORAL_CAPSULE | Freq: Two times a day (BID) | ORAL | Status: DC
  Administered 2016-08-25 – 2016-08-27 (×3): 100 mg via ORAL
  Filled 2016-08-25 (×4): qty 1

## 2016-08-25 NOTE — Progress Notes (Signed)
Patient short of breath intermittently, tired.  Patient will be having his pacemaker placed tomorrow, 08/26/16

## 2016-08-25 NOTE — Progress Notes (Signed)
Dr Saralyn Pilar notified that last plavix dose taken by the patient was at his home before being admitted 08/21/16 between 9 and 10am

## 2016-08-25 NOTE — Consult Note (Signed)
Patient with black stools and maroon colored stools.  He has hx of   Hgb stable in 9 range.  After he gets his pacemaker we can make plans for colonoscopy and possible EGD.  Pt with hx of right colectomy a year ago for polyp with high grade dysplasia.  Waiting on Plavix effect to attenuate.  No new recommendations.

## 2016-08-25 NOTE — Progress Notes (Signed)
SUBJECTIVE: Pt is feeling well. Resting comfortably.    Vitals:   08/24/16 0434 08/24/16 1241 08/24/16 2014 08/25/16 0439  BP: (!) 121/56 118/60 (!) 92/55 (!) 120/51  Pulse: (!) 52 (!) 58 (!) 54 (!) 50  Resp: 16 16 17 19   Temp: 98.1 F (36.7 C) 98.2 F (36.8 C) 98 F (36.7 C) 98.3 F (36.8 C)  TempSrc: Oral Oral Oral Oral  SpO2: 95% 95% 92% 92%  Weight: 235 lb 14.4 oz (107 kg)   226 lb 1.6 oz (102.6 kg)  Height:        Intake/Output Summary (Last 24 hours) at 08/25/16 0900 Last data filed at 08/25/16 0700  Gross per 24 hour  Intake              444 ml  Output             2775 ml  Net            -2331 ml    LABS: Basic Metabolic Panel:  Recent Labs  08/23/16 0333 08/24/16 1333 08/25/16 0440  NA 138 137  --   K 3.9 3.8  --   CL 109 108  --   CO2 21* 22  --   GLUCOSE 106* 105*  --   BUN 19 22*  --   CREATININE 1.30* 1.58* 1.65*  CALCIUM 8.5* 8.8*  --   MG 1.6*  --   --    Liver Function Tests: No results for input(s): AST, ALT, ALKPHOS, BILITOT, PROT, ALBUMIN in the last 72 hours. No results for input(s): LIPASE, AMYLASE in the last 72 hours. CBC:  Recent Labs  08/23/16 0333 08/24/16 1333  WBC 10.1  --   HGB 9.1* 9.9*  HCT 28.1*  --   MCV 75.9*  --   PLT 206  --    Cardiac Enzymes:  Recent Labs  08/22/16 1039  TROPONINI <0.03   BNP: Invalid input(s): POCBNP D-Dimer: No results for input(s): DDIMER in the last 72 hours. Hemoglobin A1C:  Recent Labs  08/23/16 0333  HGBA1C 6.1*   Fasting Lipid Panel: No results for input(s): CHOL, HDL, LDLCALC, TRIG, CHOLHDL, LDLDIRECT in the last 72 hours. Thyroid Function Tests: No results for input(s): TSH, T4TOTAL, T3FREE, THYROIDAB in the last 72 hours.  Invalid input(s): FREET3 Anemia Panel: No results for input(s): VITAMINB12, FOLATE, FERRITIN, TIBC, IRON, RETICCTPCT in the last 72 hours.   PHYSICAL EXAM General: Well developed, well nourished, in no acute distress HEENT:  Normocephalic and  atramatic Neck:  No JVD.  Lungs: Clear bilaterally to auscultation and percussion. Heart: HRRR . Normal S1 and S2 without gallops or murmurs.  Abdomen: Bowel sounds are positive, abdomen soft and non-tender  Msk:  Back normal, normal gait. Normal strength and tone for age. Extremities: No clubbing, cyanosis or edema.   Neuro: Alert and oriented X 3. Psych:  Good affect, responds appropriately  TELEMETRY: Second degree heart block  ASSESSMENT AND PLAN: Symptomatic bradycardia with CHF exacerbation. Pt last took Plavix  4-5 days ago, waiting for washout before pacemaker can be placed. Fluid retention is improving. Continue lasix.  Active Problems:   Upper GI bleed    Jake Bathe, NP-C 08/25/2016 9:00 AM

## 2016-08-25 NOTE — Progress Notes (Signed)
Physical Therapy Treatment Patient Details Name: Omar [name] MRN: [id number] DOB: [birth year] Today's Date: 08/25/2016    History of Present Illness 76 yo male with onset of upper GI bleed and passing blood, with flat troponins, lung edema, SOB.  PMHx:  2nd degree heart block, RA, a-fib, DM, adenomas, OSA, prostate CA, GERD,    PT Comments    Pt complains of fatigue, but agreeable to PT. Heart rate throughout treatment 54-56 beats per minute; O2 saturation 90-95% on room air. Pt performs supine and seated exercises with rest times between sets/exercises. Denies pain initially until weight bearing on right ankle, which causes pain/soreness with limp when ambulating bed to chair requiring Min guard;assist. Pt up in chair comfortably. Pt reports he is to receive permanent pacemaker tomorrow Thursday, 08/26/16. Pt will need new or continuation PT orders post surgery.   Follow Up Recommendations  Home health PT;Supervision/Assistance - 24 hour     Equipment Recommendations  Rolling walker with 5" wheels    Recommendations for Other Services       Precautions / Restrictions Precautions Precautions: Fall Restrictions Weight Bearing Restrictions: No    Mobility  Bed Mobility Overal bed mobility: Modified Independent             General bed mobility comments: Use of rails, mild increased time  Transfers Overall transfer level: Needs assistance Equipment used: Rolling walker (2 wheeled);1 person hand held assist Transfers: Sit to/from [facility] Sit to Stand: Min guard         General transfer comment:  (Sore R ankle with walking )  Ambulation/Gait Ambulation/Gait assistance: Min guard;Min assist Ambulation Distance (Feet): 3 Feet (bed to chair) Assistive device: Rolling walker (2 wheeled) Gait Pattern/deviations: Step-to pattern;Antalgic (antalgic on R ankle)         Stairs            Wheelchair Mobility    Modified Rankin (Stroke  Patients Only)       Balance Overall balance assessment: Needs assistance Sitting-balance support: Feet supported Sitting balance-Leahy Scale: Normal     Standing balance support: Bilateral upper extremity supported Standing balance-Leahy Scale: Fair                      Cognition Arousal/Alertness: Awake/alert Behavior During Therapy: WFL for tasks assessed/performed Overall Cognitive Status: Within Functional Limits for tasks assessed                      Exercises General Exercises - Lower Extremity Ankle Circles/Pumps: AROM;Both;20 reps;Supine Quad Sets: Strengthening;Both;20 reps;Supine Gluteal Sets: Strengthening;Both;20 reps;Supine Long Arc Quad: AROM;Both;10 reps;Seated (2 sets) Heel Slides: AROM;Both;10 reps;Supine (2 sets) Hip ABduction/ADduction: AROM;Both;10 reps;Supine (2 sets)    General Comments        Pertinent Vitals/Pain Pain Assessment: No/denies pain (until walking; wb'g on R ankle sore/significant limp)    Home Living                      Prior Function            PT Goals (current goals can now be found in the care plan section)      Frequency    Min 2X/week      PT Plan Current plan remains appropriate    Co-evaluation             End of Session Equipment Utilized During Treatment: Gait belt Activity Tolerance: Patient tolerated treatment well Patient left: in chair;with  chair alarm set;with call bell/phone within reach Nurse Communication: Other (comment) (O2 saturation on room air) PT Visit Diagnosis: Unsteadiness on feet (R26.81);Difficulty in walking, not elsewhere classified (R26.2)     Time: 1753-0104 PT Time Calculation (min) (ACUTE ONLY): 38 min  Charges:  $Gait Training: 8-22 mins $Therapeutic Exercise: 23-37 mins                    G CodesLarae Grooms, PTA 08/25/2016, 12:24 PM

## 2016-08-25 NOTE — Progress Notes (Signed)
[facility] [facility] [place] NAME: Omar [name]    MR#:  [id number]  DATE OF BIRTH:  Jul 16, 1940  SUBJECTIVE:  CHIEF COMPLAINT:   Chief Complaint  Patient presents with  . Shortness of Breath   The patient is 76 year old male with past medical history significant for history of anxiety, rheumatoid arthritis, A. fib, diabetes, hypertension, hyperlipidemia, gastroesophageal reflux disease, prostate cancer, obstructive sleep apnea, who presents to the hospital with complaints of shortness of breath. He also complained of some black stools as well as bright red blood per rectum. He was noted to have lower extremity edema, his chest x-ray was concerning for congestive heart failure, he was admitted for IV Lasix. The patient was diuresed about 210 cc over his stay in the hospital time, he feels somewhat improved, still requires 2 L of oxygen through nasal cannula, not on oxygen at home. The patient feels somewhat better today, for permanent pacemaker placement on Wednesday or Thursday by Dr. Saralyn Pilar. EGD was canceled. The patient is considered to have significant chronotropic incompetence by cardiologist, recommended to keep him in the hospital for permanent pacemaker placement, Likely tomorrow. Patient feels satisfactory today, but denies any shortness of breath, he is on room air at present.Heart rate remained in the 50s    Review of Systems  Constitutional: Positive for malaise/fatigue. Negative for chills, fever and weight loss.  HENT: Negative for congestion.   Eyes: Negative for blurred vision and double vision.  Respiratory: Positive for cough, sputum production, shortness of breath and wheezing.   Cardiovascular: Positive for leg swelling and PND. Negative for chest pain, palpitations and orthopnea.  Gastrointestinal: Negative for abdominal pain, blood in stool, constipation, diarrhea, nausea and vomiting.  Genitourinary: Negative for  dysuria, frequency, hematuria and urgency.  Musculoskeletal: Negative for falls.  Neurological: Positive for dizziness. Negative for tremors, focal weakness and headaches.  Endo/Heme/Allergies: Does not bruise/bleed easily.  Psychiatric/Behavioral: Negative for depression. The patient does not have insomnia.     VITAL SIGNS: Blood pressure (!) 127/53, pulse (!) 52, temperature 98.1 F (36.7 C), temperature source Oral, resp. rate 20, height 5\' 11"  (1.803 m), weight 102.6 kg (226 lb 1.6 oz), SpO2 100 %.  PHYSICAL EXAMINATION:   GENERAL:  76 y.o.-year-old patient sitting in the chair relatively more comfortable, not in significant respiratory distress  EYES: Pupils equal, round, reactive to light and accommodation. No scleral icterus. Extraocular muscles intact.  HEENT: Head atraumatic, normocephalic. Oropharynx and nasopharynx clear.  NECK:  Supple, no jugular venous distention. No thyroid enlargement, no tenderness.  LUNGS: Some diminished breath sounds bilaterally, no wheezing, rales,rhonchi , basilar bilateral crepitations. Intermittent use of accessory muscles of respiration.  CARDIOVASCULAR: S1, S2 , rhythm is regular. No murmurs, rubs, or gallops.  ABDOMEN: Soft, nontender, nondistended. Bowel sounds present. No organomegaly or mass.  EXTREMITIES: 2+ lower extremity and pedal edema, no cyanosis, or clubbing.  NEUROLOGIC: Cranial nerves II through XII are intact. Muscle strength 5/5 in all extremities. Sensation intact. Gait not checked.  PSYCHIATRIC: The patient is alert and oriented x 3.  SKIN: No obvious rash, lesion, or ulcer.   ORDERS/RESULTS REVIEWED:   CBC  Recent Labs Lab 08/21/16 1912 08/22/16 0447 08/23/16 0333 08/24/16 1333  WBC 10.3 10.8* 10.1  --   HGB 9.0* 9.2* 9.1* 9.9*  HCT 29.1* 29.1* 28.1*  --   PLT 221 210 206  --   MCV 77.1* 76.4* 75.9*  --   MCH 24.0* 24.1* 24.6*  --  MCHC 31.1* 31.6* 32.4  --   RDW 24.6* 24.9* 25.0*  --   LYMPHSABS 0.9*  --   --    --   MONOABS 0.8  --   --   --   EOSABS 0.4  --   --   --   BASOSABS 0.1  --   --   --    ------------------------------------------------------------------------------------------------------------------  Chemistries   Recent Labs Lab 08/21/16 1912 08/22/16 0447 08/23/16 0333 08/24/16 1333 08/25/16 0440  NA 141 140 138 137  --   K 3.9 4.0 3.9 3.8  --   CL 115* 115* 109 108  --   CO2 20* 20* 21* 22  --   GLUCOSE 129* 107* 106* 105*  --   BUN 16 15 19  22*  --   CREATININE 1.21 1.18 1.30* 1.58* 1.65*  CALCIUM 8.5* 8.4* 8.5* 8.8*  --   MG  --   --  1.6*  --   --    ------------------------------------------------------------------------------------------------------------------ estimated creatinine clearance is 47.9 mL/min (A) (by C-G formula based on SCr of 1.65 mg/dL (H)). ------------------------------------------------------------------------------------------------------------------ No results for input(s): TSH, T4TOTAL, T3FREE, THYROIDAB in the last 72 hours.  Invalid input(s): FREET3  Cardiac Enzymes  Recent Labs Lab 08/21/16 2331 08/22/16 0447 08/22/16 1039  TROPONINI <0.03 <0.03 <0.03   ------------------------------------------------------------------------------------------------------------------ Invalid input(s): POCBNP ---------------------------------------------------------------------------------------------------------------  RADIOLOGY: Dg Chest 2 View  Result Date: 08/23/2016 CLINICAL DATA:  Shortness of breath and weakness EXAM: CHEST  2 VIEW COMPARISON:  08/21/2016 FINDINGS: Cardiac shadow is at the upper limits of normal in size. The lungs are well aerated bilaterally. Small effusions are again identified. Mild central vascular congestion remains. No focal infiltrate is seen. IMPRESSION: Mild CHF with small effusions. Electronically Signed   By: Inez Catalina M.D.   On: 08/23/2016 19:47   Nm Pulmonary Perf And Vent  Result Date:  08/23/2016 CLINICAL DATA:  Shortness of breath x1 week, asthma EXAM: NUCLEAR MEDICINE VENTILATION - PERFUSION LUNG SCAN TECHNIQUE: Ventilation images were obtained in multiple projections using inhaled aerosol Tc-60m DTPA. Perfusion images were obtained in multiple projections after intravenous injection of Tc-71m MAA. RADIOPHARMACEUTICALS:  32.1 mCi Technetium-37m DTPA aerosol inhalation and 4.1 mCi Technetium-25m MAA IV COMPARISON:  None. FINDINGS: Ventilation: No focal ventilation defect. Mild clumping in the central airways. Perfusion: No wedge shaped peripheral perfusion defects to suggest acute pulmonary embolism. IMPRESSION: Negative for pulmonary embolism. Electronically Signed   By: Julian Hy M.D.   On: 08/23/2016 19:47    EKG:  Orders placed or performed during the hospital encounter of 08/21/16  . ED EKG  . ED EKG  . EKG 12-Lead  . EKG 12-Lead  . ED EKG  . ED EKG  . EKG 12-Lead    ASSESSMENT AND PLAN:  Active Problems:   Upper GI bleed  #1. Acute diastolic CHF, due to dietary indiscretions, Chronotropic incompetence, continue furosemide twice daily orally, negative about 4.3 L since admission,  weaned off oxygen. Echocardiogram revealed a right sided failure, moderate tricuspid regurgitation, normal ejection fraction. Discussed with cardiologist about permanent pacemaker placement. The patient is considered to have significant chronotropic incompetence requiring him to stay in the hospital for permanent pacemaker placement, which will be performed tomorrow due to Plavix used on Saturday. VQ scan was low probability for pulmonary embolism.   #2. High grade heart block, chronotropic incompetence,  TSH was normal, the patient is to undergo permanent pacemaker placement due to chronotropic incompetence , patient is symptomatic - has CHF, generalized  weakness, sluggishness, unable to sleep in bed due to orthopnea. Heart rate remains low in 50s #3. Leukocytosis of unclear etiology,   urinalysis was unremarkable. Leukocytosis has resolved  #4. Diabetes, hemoglobin A1c was 6.1,  continue sliding scale insulin   #5, rectal bleed, likely due to radiation proctitis, per gastroenterologist, however, patient admited of having some black stools, continue PPI twice a day, for EGD after  a permanent pacemaker is placed, likely as outpatient, patient tolerates regular diet with no bleeding #6. Iron deficiency anemia, no need to transfuse at this time, although patient did have about 2 g hemoglobin drop since fourth of March 2017/in one year, hemoglobin level remains stable,  discussed with patient's wife #7. Right-sided heart failure, VQ scan was low probably for pulmonary embolism #8. Acute on chronic renal insufficiency, seems to be worsening, patient's Lasix was changed from IV to oral route, follow creatinine in the morning  Management plans discussed with the patient, family and they are in agreement.   DRUG ALLERGIES: No Known Allergies  CODE STATUS:     Code Status Orders        Start     Ordered   08/21/16 2310  Full code  Continuous     08/21/16 2309    Code Status History    Date Active Date Inactive Code Status Order ID Comments User Context   08/11/2015 10:24 AM 08/12/2015 12:56 PM Full Code [id number]  Robert Bellow, MD Inpatient      TOTAL TIME TAKING CARE OF THIS PATIENT:  35 minutes.   Discussed with patient's wife, Stanton Kidney, all questions were answered Jaleigha Deane M.D on 08/25/2016 at 4:17 PM  Between 7am to 6pm - Pager - [phone]  After 6pm go to [email or web address] - password [email or web address] [facility]  Office  [phone]  CC: Primary care physician; Volanda Napoleon, MD

## 2016-08-26 ENCOUNTER — Encounter
Admission: EM | Disposition: A | Discharge status: Home Health | Payer: Self-pay | Source: Home / Self Care | Attending: Internal Medicine

## 2016-08-26 ENCOUNTER — Inpatient Hospital Stay: Payer: Medicare Other

## 2016-08-26 ENCOUNTER — Inpatient Hospital Stay: Payer: Medicare Other | Admitting: Anesthesiology

## 2016-08-26 ENCOUNTER — Encounter: Payer: Self-pay | Admitting: *Deleted

## 2016-08-26 DIAGNOSIS — I442 Atrioventricular block, complete: Secondary | ICD-10-CM

## 2016-08-26 HISTORY — DX: Atrioventricular block, complete: I44.2

## 2016-08-26 HISTORY — PX: PACEMAKER INSERTION: SHX728

## 2016-08-26 LAB — GLUCOSE, CAPILLARY
GLUCOSE-CAPILLARY: 92 mg/dL (ref 65–99)
Glucose-Capillary: 101 mg/dL — ABNORMAL HIGH (ref 65–99)
Glucose-Capillary: 108 mg/dL — ABNORMAL HIGH (ref 65–99)
Glucose-Capillary: 114 mg/dL — ABNORMAL HIGH (ref 65–99)
Glucose-Capillary: 125 mg/dL — ABNORMAL HIGH (ref 65–99)
Glucose-Capillary: 76 mg/dL (ref 65–99)
Glucose-Capillary: 96 mg/dL (ref 65–99)

## 2016-08-26 LAB — BASIC METABOLIC PANEL
Anion gap: 6 (ref 5–15)
BUN: 23 mg/dL — AB (ref 6–20)
CALCIUM: 8.6 mg/dL — AB (ref 8.9–10.3)
CO2: 22 mmol/L (ref 22–32)
CREATININE: 1.48 mg/dL — AB (ref 0.61–1.24)
Chloride: 108 mmol/L (ref 101–111)
GFR calc Af Amer: 52 mL/min — ABNORMAL LOW (ref >60)
GFR, EST NON AFRICAN AMERICAN: 45 mL/min — AB (ref >60)
GLUCOSE: 104 mg/dL — AB (ref 65–99)
Potassium: 4.4 mmol/L (ref 3.5–5.1)
Sodium: 136 mmol/L (ref 135–145)

## 2016-08-26 SURGERY — INSERTION, CARDIAC PACEMAKER
Anesthesia: General | Laterality: Left | Wound class: Clean

## 2016-08-26 MED ORDER — LIDOCAINE HCL (PF) 2 % IJ SOLN
INTRAMUSCULAR | Status: AC
  Filled 2016-08-26: qty 2

## 2016-08-26 MED ORDER — FENTANYL CITRATE (PF) 100 MCG/2ML IJ SOLN
INTRAMUSCULAR | Status: DC | PRN
  Administered 2016-08-26: 25 ug via INTRAVENOUS

## 2016-08-26 MED ORDER — PROPOFOL 10 MG/ML IV BOLUS
INTRAVENOUS | Status: AC
  Filled 2016-08-26: qty 20

## 2016-08-26 MED ORDER — PROPOFOL 500 MG/50ML IV EMUL
INTRAVENOUS | Status: AC
  Filled 2016-08-26: qty 50

## 2016-08-26 MED ORDER — CEFAZOLIN SODIUM-DEXTROSE 2-4 GM/100ML-% IV SOLN
2.0000 g | Freq: Once | INTRAVENOUS | Status: AC
  Administered 2016-08-26: 2 g via INTRAVENOUS

## 2016-08-26 MED ORDER — FENTANYL CITRATE (PF) 100 MCG/2ML IJ SOLN
INTRAMUSCULAR | Status: AC
  Filled 2016-08-26: qty 2

## 2016-08-26 MED ORDER — PROPOFOL 500 MG/50ML IV EMUL
INTRAVENOUS | Status: DC | PRN
  Administered 2016-08-26: 75 ug/kg/min via INTRAVENOUS

## 2016-08-26 MED ORDER — FENTANYL CITRATE (PF) 100 MCG/2ML IJ SOLN: 25.0000 ug | INTRAMUSCULAR | Status: DC | PRN

## 2016-08-26 MED ORDER — MIDAZOLAM HCL 2 MG/2ML IJ SOLN
INTRAMUSCULAR | Status: DC | PRN
  Administered 2016-08-26: 1 mg via INTRAVENOUS

## 2016-08-26 MED ORDER — ACETAMINOPHEN 325 MG PO TABS: 325.0000 mg | ORAL_TABLET | ORAL | Status: DC | PRN

## 2016-08-26 MED ORDER — PROPOFOL 10 MG/ML IV BOLUS
INTRAVENOUS | Status: DC | PRN
  Administered 2016-08-26: 20 mg via INTRAVENOUS

## 2016-08-26 MED ORDER — LIDOCAINE 1 % OPTIME INJ - NO CHARGE
INTRAMUSCULAR | Status: DC | PRN
  Administered 2016-08-26: 30 mL

## 2016-08-26 MED ORDER — ONDANSETRON HCL 4 MG/2ML IJ SOLN: 4.0000 mg | Freq: Once | INTRAMUSCULAR | Status: DC | PRN

## 2016-08-26 MED ORDER — DEXTROSE-NACL 5-0.45 % IV SOLN
INTRAVENOUS | Status: DC
  Administered 2016-08-26: 50 mL/h via INTRAVENOUS

## 2016-08-26 MED ORDER — HYDROCODONE-ACETAMINOPHEN 5-325 MG PO TABS: 1.0000 | ORAL_TABLET | ORAL | Status: DC | PRN

## 2016-08-26 MED ORDER — DEXTROSE 50 % IV SOLN
INTRAVENOUS | Status: AC
  Administered 2016-08-26: 12.5 mL
  Filled 2016-08-26: qty 50

## 2016-08-26 MED ORDER — GENTAMICIN SULFATE 40 MG/ML IJ SOLN
INTRAMUSCULAR | Status: AC
  Filled 2016-08-26: qty 2

## 2016-08-26 MED ORDER — PHENYLEPHRINE HCL 10 MG/ML IJ SOLN
INTRAMUSCULAR | Status: DC | PRN
  Administered 2016-08-26: 100 ug via INTRAVENOUS

## 2016-08-26 MED ORDER — CEFAZOLIN IN D5W 1 GM/50ML IV SOLN
1.0000 g | Freq: Four times a day (QID) | INTRAVENOUS | Status: AC
  Administered 2016-08-26 – 2016-08-27 (×3): 1 g via INTRAVENOUS
  Filled 2016-08-26 (×5): qty 50

## 2016-08-26 MED ORDER — MIDAZOLAM HCL 2 MG/2ML IJ SOLN
INTRAMUSCULAR | Status: AC
  Filled 2016-08-26: qty 2

## 2016-08-26 MED ORDER — SODIUM CHLORIDE 0.9 % IV SOLN
INTRAVENOUS | Status: DC
  Administered 2016-08-26: 100 mL/h via INTRAVENOUS

## 2016-08-26 MED ORDER — SODIUM CHLORIDE 0.9 % IR SOLN
Status: DC | PRN
  Administered 2016-08-26: 200 mL

## 2016-08-26 MED ORDER — ONDANSETRON HCL 4 MG/2ML IJ SOLN: 4.0000 mg | Freq: Four times a day (QID) | INTRAMUSCULAR | Status: DC | PRN

## 2016-08-26 SURGICAL SUPPLY — 42 items
BAG DECANTER FOR FLEXI CONT (MISCELLANEOUS) ×2 IMPLANT
BLADE PHOTON ILLUMINATED (MISCELLANEOUS) ×2 IMPLANT
BRUSH SCRUB 4% CHG (MISCELLANEOUS) ×2 IMPLANT
CABLE SURG 12 DISP A/V CHANNEL (MISCELLANEOUS) ×2 IMPLANT
CANISTER SUCT 1200ML W/VALVE (MISCELLANEOUS) ×2 IMPLANT
CHLORAPREP W/TINT 26ML (MISCELLANEOUS) ×2 IMPLANT
COVER LIGHT HANDLE STERIS (MISCELLANEOUS) ×4 IMPLANT
COVER MAYO STAND STRL (DRAPES) ×2 IMPLANT
DRAPE C-ARM XRAY 36X54 (DRAPES) ×4 IMPLANT
DRAPE INCISE 23X17 IOBAN STRL (DRAPES) ×1
DRAPE INCISE IOBAN 23X17 STRL (DRAPES) ×1 IMPLANT
DRSG TEGADERM 4X4.75 (GAUZE/BANDAGES/DRESSINGS) ×2 IMPLANT
DRSG TELFA 4X3 1S NADH ST (GAUZE/BANDAGES/DRESSINGS) ×2 IMPLANT
ELECT REM PT RETURN 9FT ADLT (ELECTROSURGICAL) ×2
ELECTRODE REM PT RTRN 9FT ADLT (ELECTROSURGICAL) ×1 IMPLANT
GLOVE BIO SURGEON STRL SZ7.5 (GLOVE) ×2 IMPLANT
GLOVE BIO SURGEON STRL SZ8 (GLOVE) ×2 IMPLANT
GOWN STRL REUS W/ TWL LRG LVL3 (GOWN DISPOSABLE) ×1 IMPLANT
GOWN STRL REUS W/ TWL XL LVL3 (GOWN DISPOSABLE) ×1 IMPLANT
GOWN STRL REUS W/TWL LRG LVL3 (GOWN DISPOSABLE) ×1
GOWN STRL REUS W/TWL XL LVL3 (GOWN DISPOSABLE) ×1
IMMOBILIZER SHDR MD LX WHT (SOFTGOODS) IMPLANT
IMMOBILIZER SHDR XL LX WHT (SOFTGOODS) ×2 IMPLANT
INTRO PACEMAKR LEAD 9FR 13CM (INTRODUCER) ×2
INTRO PACEMKR SHEATH II 7FR (MISCELLANEOUS) ×2
INTRODUCER PACEMKR LD 9FR 13CM (INTRODUCER) ×1 IMPLANT
INTRODUCER PACEMKR SHTH II 7FR (MISCELLANEOUS) ×1 IMPLANT
IPG PACE AZUR XT DR MRI W1DR01 (Pacemaker) ×1 IMPLANT
IV NS 500ML (IV SOLUTION) ×1
IV NS 500ML BAXH (IV SOLUTION) ×1 IMPLANT
KIT RM TURNOVER STRD PROC AR (KITS) ×2 IMPLANT
LABEL OR SOLS (LABEL) ×2 IMPLANT
LEAD CAPSURE NOVUS 5076-52CM (Lead) ×2 IMPLANT
LEAD CAPSURE NOVUS 5076-58CM (Lead) ×2 IMPLANT
MARKER SKIN DUAL TIP RULER LAB (MISCELLANEOUS) ×2 IMPLANT
NEEDLE FILTER BLUNT 18X 1/2SAF (NEEDLE) ×1
NEEDLE FILTER BLUNT 18X1 1/2 (NEEDLE) ×1 IMPLANT
PACE AZURE XT DR MRI W1DR01 (Pacemaker) ×2 IMPLANT
PACK PACE INSERTION (MISCELLANEOUS) ×2 IMPLANT
PAD ONESTEP ZOLL R SERIES ADT (MISCELLANEOUS) ×2 IMPLANT
SUT SILK 0 SH 30 (SUTURE) ×6 IMPLANT
SYR 3ML LL SCALE MARK (SYRINGE) ×2 IMPLANT

## 2016-08-26 NOTE — Progress Notes (Signed)
PT Cancellation Note  Patient Details Name: Omar [name] MRN: [id number] DOB: Oct 13, 1940   Cancelled Treatment:    Reason Eval/Treat Not Completed: Patient at procedure or test/unavailable   Larae Grooms, PTA 08/26/2016, 11:52 AM

## 2016-08-26 NOTE — Progress Notes (Signed)
SUBJECTIVE: Patient is still short of breath   Vitals:   08/25/16 1330 08/25/16 2034 08/26/16 0354 08/26/16 0414  BP: (!) 127/53 (!) 145/71 (!) 119/59   Pulse: (!) 52 63 (!) 55   Resp: 20 16 (!) 22   Temp: 98.1 F (36.7 C) 97.9 F (36.6 C) 98.5 F (36.9 C)   TempSrc: Oral Oral Oral   SpO2: 100% 95%    Weight:    224 lb (101.6 kg)  Height:        Intake/Output Summary (Last 24 hours) at 08/26/16 0854 Last data filed at 08/26/16 0355  Gross per 24 hour  Intake          1017.33 ml  Output              400 ml  Net           617.33 ml    LABS: Basic Metabolic Panel:  Recent Labs  08/24/16 1333 08/25/16 0440 08/26/16 0358  NA 137  --  136  K 3.8  --  4.4  CL 108  --  108  CO2 22  --  22  GLUCOSE 105*  --  104*  BUN 22*  --  23*  CREATININE 1.58* 1.65* 1.48*  CALCIUM 8.8*  --  8.6*   Liver Function Tests: No results for input(s): AST, ALT, ALKPHOS, BILITOT, PROT, ALBUMIN in the last 72 hours. No results for input(s): LIPASE, AMYLASE in the last 72 hours. CBC:  Recent Labs  08/24/16 1333  HGB 9.9*   Cardiac Enzymes: No results for input(s): CKTOTAL, CKMB, CKMBINDEX, TROPONINI in the last 72 hours. BNP: Invalid input(s): POCBNP D-Dimer: No results for input(s): DDIMER in the last 72 hours. Hemoglobin A1C: No results for input(s): HGBA1C in the last 72 hours. Fasting Lipid Panel: No results for input(s): CHOL, HDL, LDLCALC, TRIG, CHOLHDL, LDLDIRECT in the last 72 hours. Thyroid Function Tests: No results for input(s): TSH, T4TOTAL, T3FREE, THYROIDAB in the last 72 hours.  Invalid input(s): FREET3 Anemia Panel: No results for input(s): VITAMINB12, FOLATE, FERRITIN, TIBC, IRON, RETICCTPCT in the last 72 hours.   PHYSICAL EXAM General: Well developed, well nourished, in no acute distress HEENT:  Normocephalic and atramatic Neck:  No JVD.  Lungs: Clear bilaterally to auscultation and percussion. Heart: HRRR . Normal S1 and S2 without gallops or murmurs.   Abdomen: Bowel sounds are positive, abdomen soft and non-tender  Msk:  Back normal, normal gait. Normal strength and tone for age. Extremities: No clubbing, cyanosis or edema.   Neuro: Alert and oriented X 3. Psych:  Good affect, responds appropriately  TELEMETRY: Type II AV block with intermittent third-degree AV block with a heart rate of 50  ASSESSMENT AND PLAN: Sick sinus syndrome with third-degree AV block getting pacemaker today.  Active Problems:   Upper GI bleed    Dionisio David, MD, [facility] 08/26/2016 8:54 AM

## 2016-08-26 NOTE — Anesthesia Procedure Notes (Signed)
Procedure Name: MAC Date/Time: 08/26/2016 12:30 PM Performed by: Hedda Slade Pre-anesthesia Checklist: Patient identified, Emergency Drugs available, Patient being monitored and Suction available Patient Re-evaluated:Patient Re-evaluated prior to inductionOxygen Delivery Method: Simple face mask

## 2016-08-26 NOTE — Care Management (Addendum)
Faxed initial home health referral information and order with face to face to Judson Roch with [facility].  Street address is [address]

## 2016-08-26 NOTE — Transfer of Care (Signed)
Immediate Anesthesia Transfer of Care Note  Patient: Robbin Escher  Procedure(s) Performed: Procedure(s): INSERTION PACEMAKER (Left)  Patient Location: PACU  Anesthesia Type:General  Level of Consciousness: sedated  Airway & Oxygen Therapy: Patient Spontanous Breathing and Patient connected to face mask oxygen  Post-op Assessment: Report given to RN and Post -op Vital signs reviewed and stable  Post vital signs: Reviewed and stable  Last Vitals:  Vitals:   08/26/16 1205 08/26/16 1342  BP: (!) 132/58 (!) 94/55  Pulse: (!) 50 70  Resp: 18 16  Temp: 36.3 C 36.3 C    Last Pain:  Vitals:   08/26/16 1205  TempSrc: Tympanic  PainSc:       Patients Stated Pain Goal: 0 (58/25/18 9842)  Complications: No apparent anesthesia complications

## 2016-08-26 NOTE — Anesthesia Preprocedure Evaluation (Signed)
Anesthesia Evaluation  Patient identified by MRN, date of birth, ID band Patient awake    Reviewed: Allergy & Precautions, H&P , NPO status , Patient's Chart, lab work & pertinent test results, reviewed documented beta blocker date and time   Airway Mallampati: II   Neck ROM: full    Dental  (+) Poor Dentition   Pulmonary neg pulmonary ROS, sleep apnea , Current Smoker,    Pulmonary exam normal        Cardiovascular hypertension, negative cardio ROS Normal cardiovascular examAtrial Fibrillation + Valvular Problems/Murmurs  Rhythm:regular Rate:Normal     Neuro/Psych negative neurological ROS  negative psych ROS   GI/Hepatic negative GI ROS, Neg liver ROS, GERD  Medicated,  Endo/Other  negative endocrine ROSdiabetesMorbid obesity  Renal/GU negative Renal ROS  negative genitourinary   Musculoskeletal   Abdominal   Peds  Hematology negative hematology ROS (+)   Anesthesia Other Findings Past Medical History: No date: Anxiety No date: Arthritis     Comment: rheumatoid No date: Atrial fibrillation (HCC)     Comment: hx of 07-07-15: Colon polyp     Comment: TUBULAR ADENOMA WITH AT LEAST HIGH-GRADE / Dr               Rayann Heman No date: Diabetes mellitus without complication ([place]) No date: Dysrhythmia     Comment: bradycardia...Marland KitchenMarland Kitchen2 to 1 heart block No date: GERD (gastroesophageal reflux disease) No date: Heart murmur     Comment: patient unaware of history of murmur No date: Hypercholesterolemia No date: Hypertension 01/01/13, 01/30/14: Prostate cancer ([place])     Comment: Gleason 3+4=7, volume 46.6 cc No date: Rheumatoid arthritis ([place])  04/03/2014 through 06/04/2014                                                   : S/P radiation therapy     Comment: Prostate 7800 cGy in 40 sessions               No date: Sleep apnea     Comment: uses cpap but it is not in the hospital with               him Past Surgical  History: 11/2013: ABDOMINAL AORTIC ANEURYSM REPAIR March 2017: COLON SURGERY     Comment: Right hemicolectomy for tubulovillous adenoma               with high-grade dysplasia. 07/07/2015: COLONOSCOPY WITH PROPOFOL N/A     Comment: Procedure: COLONOSCOPY WITH PROPOFOL;                Surgeon: Josefine Class, MD;  Location:               [facility] ENDOSCOPY;  Service: Endoscopy;                Laterality: N/A; 08/08/2015: LAPAROSCOPIC RIGHT COLECTOMY Right     Comment: Procedure: LAPAROSCOPIC RIGHT COLECTOMY;                Surgeon: Robert Bellow, MD;  Location: [facility]              ORS;  Service: General;  Laterality: Right; No date: NASAL SINUS SURGERY 01/01/13, 01/30/14: PROSTATE BIOPSY     Comment: Gleason 3+3=6, vol 46.6 cc No date: TONSILLECTOMY No date: uvula surgery     Comment: for  sleep apnea BMI    Body Mass Index:  31.24 kg/m     Reproductive/Obstetrics negative OB ROS                             Anesthesia Physical Anesthesia Plan  ASA: III  Anesthesia Plan: General   Post-op Pain Management:    Induction:   Airway Management Planned:   Additional Equipment:   Intra-op Plan:   Post-operative Plan:   Informed Consent: I have reviewed the patients History and Physical, chart, labs and discussed the procedure including the risks, benefits and alternatives for the proposed anesthesia with the patient or authorized representative who has indicated his/her understanding and acceptance.   Dental Advisory Given  Plan Discussed with: CRNA  Anesthesia Plan Comments:         Anesthesia Quick Evaluation

## 2016-08-26 NOTE — Op Note (Signed)
[facility] Cardiology   08/21/2016 - 08/26/2016                     1:46 PM  PATIENT:  Omar [name]    PRE-OPERATIVE DIAGNOSIS:  Symptomatic bradycardia with 2 to one block  POST-OPERATIVE DIAGNOSIS:  Same  PROCEDURE:  INSERTION PACEMAKER  SURGEON:  Isaias Cowman, MD    ANESTHESIA:     PREOPERATIVE INDICATIONS:  [name] is a  76 y.o. male with a diagnosis of Symptomatic bradycardia with 2 to one block who failed conservative measures and elected for surgical management.    The risks benefits and alternatives were discussed with the patient preoperatively including but not limited to the risks of infection, bleeding, cardiopulmonary complications, the need for revision surgery, among others, and the patient was willing to proceed.   OPERATIVE PROCEDURE: The patient was brought to the operating room the fasting state. The left pectoral region was prepped and draped in the usual sterile manner. Anesthesia was obtained 1% lidocaine locally. A 6 cm incision was performed a left pectoral region. The pacemaker pocket was generated by electrocautery and blunt dissection. Access was obtained to left subclavian vein by fine needle aspiration. MRI compatible leads were positioned into the right ventricular apical septum and right atrial appendage under fluoroscopic guidance. After proper thresholds were obtained the leads were sutured in place. The leads were connected to a MRI compatible dual-chamber rate responsive pacemaker generator Medtronic Azure XT DR MRI [id number]). The pacemaker pocket was irrigated gentamicin solution. The pacemaker generator was positioned into the pocket and the pocket was closed with 2-0 and 4-0 Vicryl, respectively. Steri-Strips and a pressure dressing were applied.

## 2016-08-26 NOTE — Anesthesia Post-op Follow-up Note (Cosign Needed)
Anesthesia QCDR form completed.        

## 2016-08-26 NOTE — Progress Notes (Signed)
[facility] [place] NAME: Omar [name]    MR#:  [id number]  DATE OF BIRTH:  07/26/1940  SUBJECTIVE:  CHIEF COMPLAINT:   Chief Complaint  Patient presents with  . Shortness of Breath   The patient is 76 year old male with past medical history significant for history of anxiety, rheumatoid arthritis, A. fib, diabetes, hypertension, hyperlipidemia, gastroesophageal reflux disease, prostate cancer, obstructive sleep apnea, who presents to the hospital with complaints of shortness of breath. He also complained of some black stools as well as bright red blood per rectum. He was noted to have lower extremity edema, his chest x-ray was concerning for congestive heart failure, he was admitted for IV Lasix. The patient was diuresed about 210 cc over his stay in the hospital time, he feels somewhat improved, still requires 2 L of oxygen through nasal cannula, not on oxygen at home. The patient feels somewhat better today, for permanent pacemaker placement on Wednesday or Thursday by Dr. Saralyn Pilar. EGD was canceled. The patient is considered to have significant chronotropic incompetence by cardiologist, recommended to keep him in the hospital for permanent pacemaker placement,Permanent pacemaker was placed today, 22nd of March 2018 by Dr. pressures, patient is sleepy after procedure, no review of systems  Review of Systems  Unable to perform ROS: Mental acuity  Just after procedure  VITAL SIGNS: Blood pressure (!) 127/58, pulse 70, temperature 97.5 F (36.4 C), temperature source Oral, resp. rate 18, height 5\' 11"  (1.803 m), weight 101.6 kg (224 lb), SpO2 95 %.  PHYSICAL EXAMINATION:   GENERAL:  76 y.o.-year-old patient laying in the. bed, comfortable, opens his eyes, unable to converse, sleepy. Permanent pacemaker is placed into left chest area EYES: Pupils equal, round, reactive to light and accommodation. No scleral icterus. Extraocular  muscles intact.  HEENT: Head atraumatic, normocephalic. Oropharynx and nasopharynx clear.  NECK:  Supple, no jugular venous distention. No thyroid enlargement, no tenderness.  LUNGS: Some diminished breath sounds bilaterally, no wheezing, rales,rhonchi .  No use of accessory muscles of respiration.  CARDIOVASCULAR: S1, S2 , rhythm is regular. No murmurs, rubs, or gallops.  ABDOMEN: Soft, nontender, nondistended. Bowel sounds present. No organomegaly or mass.  EXTREMITIES: 1+ lower extremity and pedal edema, no cyanosis, or clubbing.  NEUROLOGIC: Cranial nerves II through XII are intact. Muscle strength 5/5 in all extremities. Sensation intact. Gait not checked.  PSYCHIATRIC: The patient is alert and oriented x 3.  SKIN: No obvious rash, lesion, or ulcer.   ORDERS/RESULTS REVIEWED:   CBC  Recent Labs Lab 08/21/16 1912 08/22/16 0447 08/23/16 0333 08/24/16 1333  WBC 10.3 10.8* 10.1  --   HGB 9.0* 9.2* 9.1* 9.9*  HCT 29.1* 29.1* 28.1*  --   PLT 221 210 206  --   MCV 77.1* 76.4* 75.9*  --   MCH 24.0* 24.1* 24.6*  --   MCHC 31.1* 31.6* 32.4  --   RDW 24.6* 24.9* 25.0*  --   LYMPHSABS 0.9*  --   --   --   MONOABS 0.8  --   --   --   EOSABS 0.4  --   --   --   BASOSABS 0.1  --   --   --    ------------------------------------------------------------------------------------------------------------------  Chemistries   Recent Labs Lab 08/21/16 1912 08/22/16 0447 08/23/16 0333 08/24/16 1333 08/25/16 0440 08/26/16 0358  NA 141 140 138 137  --  136  K 3.9 4.0 3.9 3.8  --  4.4  CL 115* 115* 109 108  --  108  CO2 20* 20* 21* 22  --  22  GLUCOSE 129* 107* 106* 105*  --  104*  BUN 16 15 19  22*  --  23*  CREATININE 1.21 1.18 1.30* 1.58* 1.65* 1.48*  CALCIUM 8.5* 8.4* 8.5* 8.8*  --  8.6*  MG  --   --  1.6*  --   --   --    ------------------------------------------------------------------------------------------------------------------ estimated creatinine clearance is 53.1  mL/min (A) (by C-G formula based on SCr of 1.48 mg/dL (H)). ------------------------------------------------------------------------------------------------------------------ No results for input(s): TSH, T4TOTAL, T3FREE, THYROIDAB in the last 72 hours.  Invalid input(s): FREET3  Cardiac Enzymes  Recent Labs Lab 08/21/16 2331 08/22/16 0447 08/22/16 1039  TROPONINI <0.03 <0.03 <0.03   ------------------------------------------------------------------------------------------------------------------ Invalid input(s): POCBNP ---------------------------------------------------------------------------------------------------------------  RADIOLOGY: Dg C-arm 1-60 Min-no Report  Result Date: 08/26/2016 Fluoroscopy was utilized by the requesting physician.  No radiographic interpretation.    EKG:  Orders placed or performed during the hospital encounter of 08/21/16  . ED EKG  . ED EKG  . EKG 12-Lead  . EKG 12-Lead  . ED EKG  . ED EKG  . EKG 12-Lead  . EKG 12-Lead in am (before 8am)    ASSESSMENT AND PLAN:  Active Problems:   Upper GI bleed  #1. Acute diastolic CHF, due to dietary indiscretions, Chronotropic incompetence, continue furosemide twice daily orally, negative about 3.6 L since admission,  On 3 L of oxygen is present weaning as tolerated.  Echocardiogram revealed a right sided failure, moderate tricuspid regurgitation, normal ejection fraction. Discussed with cardiologist about permanent pacemaker placement. The patient is considered to have significant chronotropic incompetence requiring him to stay in the hospital for permanent pacemaker placement, which was  performed March 22nd 2018 due to Plavix used on Saturday. VQ scan was low probability for pulmonary embolism.   #2. High grade heart block, chronotropic incompetence,  TSH was normal, the patient underwent permanent pacemaker placement due to chronotropic incompetence today. Heart rate improved to 70s, full  capturing #3. Leukocytosis of unclear etiology,  urinalysis was unremarkable. Leukocytosis has resolved  #4. Diabetes, hemoglobin A1c was 6.1,  continue sliding scale insulin   #5, rectal bleed, likely due to radiation proctitis, per gastroenterologist, however, patient admited of having some black stools, continue PPI twice a day, for EGD after  a permanent pacemaker is placed as outpatient, patient tolerates regular diet with no bleeding at present #6. Iron deficiency anemia, no need to transfuse at this time, although patient did have about 2 g hemoglobin drop since fourth of March 2017/in one year, hemoglobin level remains stable #7. Right-sided heart failure, VQ scan was low probably for pulmonary embolism #8. Acute on chronic renal insufficiency, some better today after Lasix was changed to oral route, follow creatinine in the morning after procedure  Management plans discussed with the patient, family and they are in agreement.   DRUG ALLERGIES: No Known Allergies  CODE STATUS:     Code Status Orders        Start     Ordered   08/21/16 2310  Full code  Continuous     08/21/16 2309    Code Status History    Date Active Date Inactive Code Status Order ID Comments User Context   08/11/2015 10:24 AM 08/12/2015 12:56 PM Full Code [id number]  Robert Bellow, MD Inpatient      TOTAL TIME TAKING CARE OF THIS PATIENT:  25 minutes.  Theodoro Grist M.D on 08/26/2016 at 4:01 PM  Between 7am to 6pm - Pager - [phone]  After 6pm go to [email or web address] - password [email or web address] [facility]  Office  [phone]  CC: Primary care physician; Volanda Napoleon, MD

## 2016-08-27 DIAGNOSIS — D62 Acute posthemorrhagic anemia: Secondary | ICD-10-CM

## 2016-08-27 DIAGNOSIS — N189 Chronic kidney disease, unspecified: Secondary | ICD-10-CM

## 2016-08-27 DIAGNOSIS — I5031 Acute diastolic (congestive) heart failure: Secondary | ICD-10-CM

## 2016-08-27 DIAGNOSIS — D500 Iron deficiency anemia secondary to blood loss (chronic): Secondary | ICD-10-CM

## 2016-08-27 DIAGNOSIS — D72829 Elevated white blood cell count, unspecified: Secondary | ICD-10-CM

## 2016-08-27 DIAGNOSIS — N289 Disorder of kidney and ureter, unspecified: Secondary | ICD-10-CM

## 2016-08-27 DIAGNOSIS — M2550 Pain in unspecified joint: Secondary | ICD-10-CM

## 2016-08-27 DIAGNOSIS — I4430 Unspecified atrioventricular block: Secondary | ICD-10-CM

## 2016-08-27 DIAGNOSIS — M109 Gout, unspecified: Secondary | ICD-10-CM

## 2016-08-27 DIAGNOSIS — Z950 Presence of cardiac pacemaker: Secondary | ICD-10-CM

## 2016-08-27 LAB — GLUCOSE, CAPILLARY
Glucose-Capillary: 117 mg/dL — ABNORMAL HIGH (ref 65–99)
Glucose-Capillary: 104 mg/dL — ABNORMAL HIGH (ref 65–99)
Glucose-Capillary: 109 mg/dL — ABNORMAL HIGH (ref 65–99)
Glucose-Capillary: 130 mg/dL — ABNORMAL HIGH (ref 65–99)

## 2016-08-27 LAB — BASIC METABOLIC PANEL
Anion gap: 8 (ref 5–15)
BUN: 24 mg/dL — ABNORMAL HIGH (ref 6–20)
CALCIUM: 8.6 mg/dL — AB (ref 8.9–10.3)
CO2: 23 mmol/L (ref 22–32)
Chloride: 104 mmol/L (ref 101–111)
Creatinine, Ser: 1.55 mg/dL — ABNORMAL HIGH (ref 0.61–1.24)
GFR calc Af Amer: 49 mL/min — ABNORMAL LOW (ref >60)
GFR calc non Af Amer: 42 mL/min — ABNORMAL LOW (ref >60)
GLUCOSE: 105 mg/dL — AB (ref 65–99)
Potassium: 4.6 mmol/L (ref 3.5–5.1)
Sodium: 135 mmol/L (ref 135–145)

## 2016-08-27 LAB — CREATININE, SERUM
CREATININE: 1.62 mg/dL — AB (ref 0.61–1.24)
GFR calc non Af Amer: 40 mL/min — ABNORMAL LOW (ref >60)
GFR, EST AFRICAN AMERICAN: 47 mL/min — AB (ref >60)

## 2016-08-27 LAB — URIC ACID: Uric Acid, Serum: 9.7 mg/dL — ABNORMAL HIGH (ref 4.4–7.6)

## 2016-08-27 MED ORDER — METHOTREXATE 2.5 MG PO TABS
15.0000 mg | ORAL_TABLET | Freq: Once | ORAL | Status: AC
  Administered 2016-08-27: 15 mg via ORAL
  Filled 2016-08-27: qty 6

## 2016-08-27 MED ORDER — PANTOPRAZOLE SODIUM 40 MG PO TBEC: 40.0000 mg | DELAYED_RELEASE_TABLET | Freq: Two times a day (BID) | ORAL | 3 refills | Status: DC

## 2016-08-27 MED ORDER — PREDNISONE 10 MG PO TABS: 10.0000 mg | ORAL_TABLET | Freq: Every day | ORAL | 0 refills | Status: DC

## 2016-08-27 MED ORDER — FUROSEMIDE 40 MG PO TABS: 40.0000 mg | ORAL_TABLET | Freq: Every day | ORAL | 3 refills | Status: DC | PRN

## 2016-08-27 MED ORDER — CEPHALEXIN 250 MG PO CAPS: 250.0000 mg | ORAL_CAPSULE | Freq: Four times a day (QID) | ORAL | 0 refills | Status: DC

## 2016-08-27 MED ORDER — CEPHALEXIN 250 MG PO CAPS
250.0000 mg | ORAL_CAPSULE | Freq: Four times a day (QID) | ORAL | Status: DC
  Administered 2016-08-27 (×2): 250 mg via ORAL
  Filled 2016-08-27 (×3): qty 1

## 2016-08-27 MED ORDER — OXYCODONE HCL 5 MG PO TABS: 5.0000 mg | ORAL_TABLET | ORAL | 0 refills | Status: DC | PRN

## 2016-08-27 MED ORDER — POLYETHYLENE GLYCOL 3350 17 G PO PACK: 17.0000 g | PACK | Freq: Every day | ORAL | 0 refills | Status: DC | PRN

## 2016-08-27 NOTE — Progress Notes (Signed)
This set completed by Eliezer Lofts NT

## 2016-08-27 NOTE — Discharge Summary (Signed)
[facility] NAME: Omar [name]    MR#:  [id number]  DATE OF BIRTH:  Aug 30, 1940  DATE OF ADMISSION:  08/21/2016 ADMITTING PHYSICIAN: Ubaldo Glassing Hugelmeyer, DO  DATE OF DISCHARGE: No discharge date for patient encounter.  PRIMARY CARE PHYSICIAN: Volanda Napoleon, MD     ADMISSION DIAGNOSIS:  Shortness of breath [R06.02] Acute pulmonary edema (HCC) [J81.0] Heme positive stool [R19.5] Anemia, unspecified type [D64.9]  DISCHARGE DIAGNOSIS:  Active Problems:   Upper GI bleed   Acute diastolic CHF (congestive heart failure) (HCC)   AVB (atrioventricular block)   Cardiac pacemaker   Acute posthemorrhagic anemia   Iron deficiency anemia due to chronic blood loss   Acute on chronic renal insufficiency   Gout attack   Leukocytosis   Joint pain   SECONDARY DIAGNOSIS:   Past Medical History:  Diagnosis Date  . Anxiety   . Arthritis    rheumatoid  . Atrial fibrillation (HCC)    hx of  . Colon polyp 07-07-15   TUBULAR ADENOMA WITH AT LEAST HIGH-GRADE / Dr Rayann Heman  . Diabetes mellitus without complication ([place])   . Dysrhythmia    bradycardia...Marland KitchenMarland Kitchen2 to 1 heart block  . GERD (gastroesophageal reflux disease)   . Heart murmur    patient unaware of history of murmur  . Hypercholesterolemia   . Hypertension   . Prostate cancer ([place]) 01/01/13, 01/30/14   Gleason 3+4=7, volume 46.6 cc  . Rheumatoid arthritis ([place])   . S/P radiation therapy  04/03/2014 through 06/04/2014                                                      Prostate 7800 cGy in 40 sessions                          . Sleep apnea    uses cpap but it is not in the hospital with him    .pro HOSPITAL COURSE:   The patient is 76 year old male with past medical history significant for history of anxiety, rheumatoid arthritis, A. fib, diabetes, hypertension, hyperlipidemia, gastroesophageal reflux disease, prostate cancer, obstructive sleep apnea, who  presents to the hospital with complaints of shortness of breath. He also complained of some black stools as well as bright red blood per rectum. He was noted to have lower extremity edema, his chest x-ray was concerning for congestive heart failure, he was admitted for IV Lasix administration. The patient was diuresed about 5.4 L  over his stay in the hospital time, he clinically improved, was weaned off oxygen to room air. Due to high-grade AV block, he was seen by cardiologist, who felt the patient had chronotropic incompetence and recommended permanent pacemaker placement, which was performed 08/31/2016 by Dr. Saralyn Pilar. Post procedure, patient felt satisfactory, denied any chest pains or significant shortness of breath. With conservative therapy, he is gastrointestinal bleeding subsided and resolved. He was seen by gastroenterologist, who recommended outpatient follow-up. Patient was advised to continue PPIs twice a day. While in the hospital patient was complaining of some joint pains, uric acid level was found to be elevated at above 90, concerning for gout. Steroid taper was prescribed for patient upon discharge. Patient was seen by physical therapist and recommended home health services Discussion by  problem: #1. Acute diastolic CHF, due to dietary indiscretions, Chronotropic incompetence, continue furosemide daily as needed orally, negative about 5.4  L since admission.  Echocardiogram revealed a right sided failure, moderate tricuspid regurgitation, normal ejection fraction. The patient is felt to have significant chronotropic incompetence ,  permanent pacemaker placement was  performed March 22nd 2018, delayed due to Plavix. VQ scan was low probability for pulmonary embolism.   Postprocedure patient did well #2. Third-degree A-V block, chronotropic incompetence,  TSH was normal, the patient underwent permanent pacemaker placement 08/26/2016 . Heart rate improved to 70s, full capturing #3. Leukocytosis  of unclear etiology,  urinalysis was unremarkable. Leukocytosis has resolved  #4. Diabetes, hemoglobin A1c was 6.1,   the patient was managed on sliding scale insulin while in the hospital, continue diabetic diet, diabetes was well controlled  #5, rectal bleed, likely due to radiation proctitis, per gastroenterologist, however, patient admited of having some black stools, continue PPI twice a day, for EGD after  a permanent pacemaker is placed as outpatient, patient tolerates regular diet with no bleeding for prolonged period of time now #6. Iron deficiency anemia, no need to transfuse at this time, although patient did have about 2 g hemoglobin drop since fourth of March 2017/in one year, hemoglobin level remains stable #7. Right-sided heart failure, VQ scan was low probably for pulmonary embolism #8. Acute on chronic renal insufficiency, fluctuating intermittently, it is recommended to follow patient's creatinine closely as outpatient, since he received IV dye during the procedure #9. Gout exacerbation, patient was initiated on steroid taper, continue physical therapy at home DISCHARGE CONDITIONS:   Stable  CONSULTS OBTAINED:  Treatment Team:  San Jetty, MD Dionisio David, MD Lucilla Lame, MD Isaias Cowman, MD  DRUG ALLERGIES:  No Known Allergies  DISCHARGE MEDICATIONS:   Current Discharge Medication List    START taking these medications   Details  cephALEXin (KEFLEX) 250 MG capsule Take 1 capsule (250 mg total) by mouth every 6 (six) hours. Qty: 28 capsule, Refills: 0    furosemide (LASIX) 40 MG tablet Take 1 tablet (40 mg total) by mouth daily as needed for fluid or edema. Qty: 30 tablet, Refills: 3    oxyCODONE (OXY IR/ROXICODONE) 5 MG immediate release tablet Take 1 tablet (5 mg total) by mouth every 4 (four) hours as needed for moderate pain. Qty: 12 tablet, Refills: 0    polyethylene glycol (MIRALAX / GLYCOLAX) packet Take 17 g by mouth daily as needed for mild  constipation, moderate constipation or severe constipation. Qty: 14 each, Refills: 0    predniSONE (DELTASONE) 10 MG tablet Take 1 tablet (10 mg total) by mouth daily with breakfast. Please take 6 pills in the morning on the days 1, 2 and 3, then taper by one pill daily until finished, thank you Qty: 33 tablet, Refills: 0      CONTINUE these medications which have CHANGED   Details  pantoprazole (PROTONIX) 40 MG tablet Take 1 tablet (40 mg total) by mouth 2 (two) times daily. Qty: 60 tablet, Refills: 3      CONTINUE these medications which have NOT CHANGED   Details  ALPRAZolam (XANAX) 0.25 MG tablet Take 0.25 mg by mouth 2 (two) times daily.    atorvastatin (LIPITOR) 80 MG tablet Take 80 mg by mouth at bedtime.     busPIRone (BUSPAR) 5 MG tablet Take 5 mg by mouth 2 (two) times daily.     Butalbital-APAP-Caffeine 50-300-40 MG CAPS Take 1 capsule by mouth every  6 (six) hours as needed (for migraines).     cetirizine (ZYRTEC) 10 MG tablet Take 10 mg by mouth daily.    diphenoxylate-atropine (LOMOTIL) 2.5-0.025 MG tablet Take 1 tablet by mouth 4 (four) times daily as needed for diarrhea or loose stools.    fluticasone (FLONASE) 50 MCG/ACT nasal spray Place 2 sprays into both nostrils daily as needed for rhinitis.     Fluticasone Furoate-Vilanterol (BREO ELLIPTA) 100-25 MCG/INH AEPB Inhale 1 puff into the lungs daily.     folic acid (FOLVITE) 1 MG tablet Take 1 mg by mouth daily.    gabapentin (NEURONTIN) 100 MG capsule Take 100 mg by mouth at bedtime.    isosorbide mononitrate (IMDUR) 30 MG 24 hr tablet Take 30 mg by mouth daily.    methotrexate (RHEUMATREX) 15 MG tablet Take 15 mg by mouth once a week. Caution: Chemotherapy. Protect from light.    triamcinolone cream (KENALOG) 0.1 % Apply 1 application topically 2 (two) times daily.    CHERATUSSIN AC 100-10 MG/5ML syrup 4 (four) times daily as needed.     cyclobenzaprine (FLEXERIL) 5 MG tablet Take 5 mg by mouth 3 (three)  times daily as needed for muscle spasms.    fexofenadine (ALLEGRA) 180 MG tablet Take 180 mg by mouth daily.    tamsulosin (FLOMAX) 0.4 MG CAPS capsule Take 1 capsule (0.4 mg total) by mouth daily. Qty: 30 capsule, Refills: 3      STOP taking these medications     amLODipine-benazepril (LOTREL) 5-20 MG per capsule      aspirin EC 81 MG tablet      celecoxib (CELEBREX) 200 MG capsule      clopidogrel (PLAVIX) 75 MG tablet      metFORMIN (GLUCOPHAGE) 500 MG tablet          DISCHARGE INSTRUCTIONS:    The patient is to follow-up with primary care physician within one week after discharge, cardiologist, within 1 week after discharge  If you experience worsening of your admission symptoms, develop shortness of breath, life threatening emergency, suicidal or homicidal thoughts you must seek medical attention immediately by calling 911 or calling your MD immediately  if symptoms less severe.  You Must read complete instructions/literature along with all the possible adverse reactions/side effects for all the Medicines you take and that have been prescribed to you. Take any new Medicines after you have completely understood and accept all the possible adverse reactions/side effects.   Please note  You were cared for by a hospitalist during your hospital stay. If you have any questions about your discharge medications or the care you received while you were in the hospital after you are discharged, you can call the unit and asked to speak with the hospitalist on call if the hospitalist that took care of you is not available. Once you are discharged, your primary care physician will handle any further medical issues. Please note that NO REFILLS for any discharge medications will be authorized once you are discharged, as it is imperative that you return to your primary care physician (or establish a relationship with a primary care physician if you do not have one) for your aftercare needs so  that they can reassess your need for medications and monitor your lab values.    Today   CHIEF COMPLAINT:   Chief Complaint  Patient presents with  . Shortness of Breath    HISTORY OF PRESENT ILLNESS:  Omar [name]  is a 76 y.o. male with a  known history of anxiety, rheumatoid arthritis, A. fib, diabetes, hypertension, hyperlipidemia, gastroesophageal reflux disease, prostate cancer, obstructive sleep apnea, who presents to the hospital with complaints of shortness of breath. He also complained of some black stools as well as bright red blood per rectum. He was noted to have lower extremity edema, his chest x-ray was concerning for congestive heart failure, he was admitted for IV Lasix administration. The patient was diuresed about 5.4 L  over his stay in the hospital time, he clinically improved, was weaned off oxygen to room air. Due to high-grade AV block, he was seen by cardiologist, who felt the patient had chronotropic incompetence and recommended permanent pacemaker placement, which was performed 08/31/2016 by Dr. Saralyn Pilar. Post procedure, patient felt satisfactory, denied any chest pains or significant shortness of breath. With conservative therapy, he is gastrointestinal bleeding subsided and resolved. He was seen by gastroenterologist, who recommended outpatient follow-up. Patient was advised to continue PPIs twice a day. While in the hospital patient was complaining of some joint pains, uric acid level was found to be elevated at above 90, concerning for gout. Steroid taper was prescribed for patient upon discharge. Patient was seen by physical therapist and recommended home health services Discussion by problem: #1. Acute diastolic CHF, due to dietary indiscretions, Chronotropic incompetence, continue furosemide daily as needed orally, negative about 5.4  L since admission.  Echocardiogram revealed a right sided failure, moderate tricuspid regurgitation, normal ejection fraction. The  patient is felt to have significant chronotropic incompetence ,  permanent pacemaker placement was  performed March 22nd 2018, delayed due to Plavix. VQ scan was low probability for pulmonary embolism.   Postprocedure patient did well #2. Third-degree A-V block, chronotropic incompetence,  TSH was normal, the patient underwent permanent pacemaker placement 08/26/2016 . Heart rate improved to 70s, full capturing #3. Leukocytosis of unclear etiology,  urinalysis was unremarkable. Leukocytosis has resolved  #4. Diabetes, hemoglobin A1c was 6.1,   the patient was managed on sliding scale insulin while in the hospital, continue diabetic diet, diabetes was well controlled  #5, rectal bleed, likely due to radiation proctitis, per gastroenterologist, however, patient admited of having some black stools, continue PPI twice a day, for EGD after  a permanent pacemaker is placed as outpatient, patient tolerates regular diet with no bleeding for prolonged period of time now #6. Iron deficiency anemia, no need to transfuse at this time, although patient did have about 2 g hemoglobin drop since fourth of March 2017/in one year, hemoglobin level remains stable #7. Right-sided heart failure, VQ scan was low probably for pulmonary embolism #8. Acute on chronic renal insufficiency, fluctuating intermittently, it is recommended to follow patient's creatinine closely as outpatient, since he received IV dye during the procedure #9. Gout exacerbation, patient was initiated on steroid taper, continue physical therapy at home    VITAL SIGNS:  Blood pressure (!) 108/54, pulse 63, temperature 98.9 F (37.2 C), temperature source Oral, resp. rate 18, height 5\' 11"  (1.803 m), weight 101.6 kg (224 lb), SpO2 92 %.  I/O:   Intake/Output Summary (Last 24 hours) at 08/27/16 1700 Last data filed at 08/27/16 1055  Gross per 24 hour  Intake              460 ml  Output             2300 ml  Net            -1840 ml    PHYSICAL  EXAMINATION:  GENERAL:  75  y.o.-year-old patient lying in the bed with no acute distress.  EYES: Pupils equal, round, reactive to light and accommodation. No scleral icterus. Extraocular muscles intact.  HEENT: Head atraumatic, normocephalic. Oropharynx and nasopharynx clear.  NECK:  Supple, no jugular venous distention. No thyroid enlargement, no tenderness.  LUNGS: Normal breath sounds bilaterally, no wheezing, rales,rhonchi or crepitation. No use of accessory muscles of respiration.  CARDIOVASCULAR: S1, S2 normal. No murmurs, rubs, or gallops.  ABDOMEN: Soft, non-tender, non-distended. Bowel sounds present. No organomegaly or mass.  EXTREMITIES: No pedal edema, cyanosis, or clubbing.  NEUROLOGIC: Cranial nerves II through XII are intact. Muscle strength 5/5 in all extremities. Sensation intact. Gait not checked.  PSYCHIATRIC: The patient is alert and oriented x 3.  SKIN: No obvious rash, lesion, or ulcer.   DATA REVIEW:   CBC  Recent Labs Lab 08/23/16 0333 08/24/16 1333  WBC 10.1  --   HGB 9.1* 9.9*  HCT 28.1*  --   PLT 206  --     Chemistries   Recent Labs Lab 08/23/16 0333  08/27/16 0450 08/27/16 1353  NA 138  < > 135  --   K 3.9  < > 4.6  --   CL 109  < > 104  --   CO2 21*  < > 23  --   GLUCOSE 106*  < > 105*  --   BUN 19  < > 24*  --   CREATININE 1.30*  < > 1.55* 1.62*  CALCIUM 8.5*  < > 8.6*  --   MG 1.6*  --   --   --   < > = values in this interval not displayed.  Cardiac Enzymes  Recent Labs Lab 08/22/16 1039  TROPONINI <0.03    Microbiology Results  Results for orders placed or performed during the hospital encounter of 08/21/16  Surgical PCR screen     Status: None   Collection Time: 08/24/16  6:00 AM  Result Value Ref Range Status   MRSA, PCR NEGATIVE NEGATIVE Final   Staphylococcus aureus NEGATIVE NEGATIVE Final    Comment:        The Xpert SA Assay (FDA approved for NASAL specimens in patients over 52 years of age), is one component  of a comprehensive surveillance program.  Test performance has been validated by [facility] for patients greater than or equal to 74 year old. It is not intended to diagnose infection nor to guide or monitor treatment.     RADIOLOGY:  Dg C-arm 1-60 Min-no Report  Result Date: 08/26/2016 Fluoroscopy was utilized by the requesting physician.  No radiographic interpretation.    EKG:   Orders placed or performed during the hospital encounter of 08/21/16  . ED EKG  . ED EKG  . EKG 12-Lead  . EKG 12-Lead  . ED EKG  . ED EKG  . EKG 12-Lead  . EKG 12-Lead in am (before 8am)  . EKG 12-Lead in am (before 8am)  . EKG 12-Lead  . EKG 12-Lead      Management plans discussed with the patient, family and they are in agreement.  CODE STATUS:     Code Status Orders        Start     Ordered   08/21/16 2310  Full code  Continuous     08/21/16 2309    Code Status History    Date Active Date Inactive Code Status Order ID Comments User Context   08/11/2015 10:24 AM 08/12/2015 12:56 PM  Full Code [id number]  Robert Bellow, MD Inpatient      TOTAL TIME TAKING CARE OF THIS PATIENT: 40 minutes.    Theodoro Grist M.D on 08/27/2016 at 5:00 PM  Between 7am to 6pm - Pager - [phone]  After 6pm go to [email or web address] - password [email or web address] [facility]  Office  [phone]  CC: Primary care physician; Volanda Napoleon, MD

## 2016-08-27 NOTE — Progress Notes (Signed)
SUBJECTIVE: Patient is feeling much better after pacemaker   Vitals:   08/26/16 1828 08/26/16 2040 08/27/16 0518 08/27/16 0700  BP:  (!) 115/48 (!) 113/56   Pulse: 62 65 73   Resp:  20 18   Temp:  99.3 F (37.4 C) 99.2 F (37.3 C)   TempSrc:  Oral Oral   SpO2: 95% 95% 93%   Weight:    224 lb (101.6 kg)  Height:        Intake/Output Summary (Last 24 hours) at 08/27/16 0833 Last data filed at 08/27/16 0500  Gross per 24 hour  Intake          1322.33 ml  Output             2003 ml  Net          -680.67 ml    LABS: Basic Metabolic Panel:  Recent Labs  08/26/16 0358 08/27/16 0450  NA 136 135  K 4.4 4.6  CL 108 104  CO2 22 23  GLUCOSE 104* 105*  BUN 23* 24*  CREATININE 1.48* 1.55*  CALCIUM 8.6* 8.6*   Liver Function Tests: No results for input(s): AST, ALT, ALKPHOS, BILITOT, PROT, ALBUMIN in the last 72 hours. No results for input(s): LIPASE, AMYLASE in the last 72 hours. CBC:  Recent Labs  08/24/16 1333  HGB 9.9*   Cardiac Enzymes: No results for input(s): CKTOTAL, CKMB, CKMBINDEX, TROPONINI in the last 72 hours. BNP: Invalid input(s): POCBNP D-Dimer: No results for input(s): DDIMER in the last 72 hours. Hemoglobin A1C: No results for input(s): HGBA1C in the last 72 hours. Fasting Lipid Panel: No results for input(s): CHOL, HDL, LDLCALC, TRIG, CHOLHDL, LDLDIRECT in the last 72 hours. Thyroid Function Tests: No results for input(s): TSH, T4TOTAL, T3FREE, THYROIDAB in the last 72 hours.  Invalid input(s): FREET3 Anemia Panel: No results for input(s): VITAMINB12, FOLATE, FERRITIN, TIBC, IRON, RETICCTPCT in the last 72 hours.   PHYSICAL EXAM General: Well developed, well nourished, in no acute distress HEENT:  Normocephalic and atramatic Neck:  No JVD.  Lungs: Clear bilaterally to auscultation and percussion. Heart: HRRR . Normal S1 and S2 without gallops or murmurs.  Abdomen: Bowel sounds are positive, abdomen soft and non-tender  Msk:  Back normal,  normal gait. Normal strength and tone for age. Extremities: No clubbing, cyanosis or edema.   Neuro: Alert and oriented X 3. Psych:  Good affect, responds appropriately  TELEMETRY:AV sequential paced rhythm  ASSESSMENT AND PLAN: AV sequential paced rhythm after pacemaker implantation yesterday. Patient can be discharged with follow-up Tuesday at 1 PM in my office.  Active Problems:   Upper GI bleed    Dionisio David, MD, [facility] 08/27/2016 8:33 AM

## 2016-08-27 NOTE — Care Management (Signed)
Confirmed that walker was delivered to the room.  [facility] informed of discharge and anticipate home visit first of the week.  Patient informed

## 2016-08-27 NOTE — Progress Notes (Signed)
[facility] Cardiology  SUBJECTIVE: No complaints   Vitals:   08/26/16 1828 08/26/16 2040 08/27/16 0518 08/27/16 0700  BP:  (!) 115/48 (!) 113/56   Pulse: 62 65 73   Resp:  20 18   Temp:  99.3 F (37.4 C) 99.2 F (37.3 C)   TempSrc:  Oral Oral   SpO2: 95% 95% 93%   Weight:    101.6 kg (224 lb)  Height:         Intake/Output Summary (Last 24 hours) at 08/27/16 0813 Last data filed at 08/27/16 0500  Gross per 24 hour  Intake          1322.33 ml  Output             2003 ml  Net          -680.67 ml      PHYSICAL EXAM  General: Well developed, well nourished, in no acute distress HEENT:  Normocephalic and atramatic Neck:  No JVD.  Lungs: Clear bilaterally to auscultation and percussion. Heart: HRRR . Normal S1 and S2 without gallops or murmurs.  Abdomen: Bowel sounds are positive, abdomen soft and non-tender  Msk:  Back normal, normal gait. Normal strength and tone for age. Extremities: No clubbing, cyanosis or edema.   Neuro: Alert and oriented X 3. Psych:  Good affect, responds appropriately   LABS: Basic Metabolic Panel:  Recent Labs  08/26/16 0358 08/27/16 0450  NA 136 135  K 4.4 4.6  CL 108 104  CO2 22 23  GLUCOSE 104* 105*  BUN 23* 24*  CREATININE 1.48* 1.55*  CALCIUM 8.6* 8.6*   Liver Function Tests: No results for input(s): AST, ALT, ALKPHOS, BILITOT, PROT, ALBUMIN in the last 72 hours. No results for input(s): LIPASE, AMYLASE in the last 72 hours. CBC:  Recent Labs  08/24/16 1333  HGB 9.9*   Cardiac Enzymes: No results for input(s): CKTOTAL, CKMB, CKMBINDEX, TROPONINI in the last 72 hours. BNP: Invalid input(s): POCBNP D-Dimer: No results for input(s): DDIMER in the last 72 hours. Hemoglobin A1C: No results for input(s): HGBA1C in the last 72 hours. Fasting Lipid Panel: No results for input(s): CHOL, HDL, LDLCALC, TRIG, CHOLHDL, LDLDIRECT in the last 72 hours. Thyroid Function Tests: No results for input(s): TSH, T4TOTAL, T3FREE, THYROIDAB  in the last 72 hours.  Invalid input(s): FREET3 Anemia Panel: No results for input(s): VITAMINB12, FOLATE, FERRITIN, TIBC, IRON, RETICCTPCT in the last 72 hours.  Dg C-arm 1-60 Min-no Report  Result Date: 08/26/2016 Fluoroscopy was utilized by the requesting physician.  No radiographic interpretation.     Echo LV EF 60-65%  TELEMETRY: Atrial sensing with ventricular pacing:  ASSESSMENT AND PLAN:  Active Problems:   Upper GI bleed    1. Third-degree heart block, status post dual-chamber pacemaker  Recommendations  1. Keflex 250 mg QID x 7 days 2. Follow-up with Dr. Humphrey Rolls as outpatient   Isaias Cowman, MD, PhD, [facility] 08/27/2016 8:13 AM

## 2016-08-27 NOTE — Progress Notes (Signed)
Discharge instructions reviewed with the patient.  RX given to the patient for prednisone and oxycodone.  Waiting for his ride

## 2016-08-27 NOTE — Care Management Important Message (Signed)
Important Message  Patient Details  Name: Omar [name] MRN: [id number] Date of Birth: 01/14/1941   Medicare Important Message Given:  Yes    Katrina Stack, RN 08/27/2016, 8:41 AM

## 2016-08-27 NOTE — Progress Notes (Addendum)
Patient is having pain in his right foot.  Foot/ankle swollen.  Patient says foot feels weak.  Patient said he is not getting home methotrexate med.  Med verified with patients pharmacy and ordered

## 2016-08-30 NOTE — Anesthesia Postprocedure Evaluation (Signed)
Anesthesia Post Note  Patient: Omar [name]  Procedure(s) Performed: Procedure(s) (LRB): INSERTION PACEMAKER (Left)  Patient location during evaluation: PACU Anesthesia Type: General Level of consciousness: awake and alert Pain management: pain level controlled Vital Signs Assessment: post-procedure vital signs reviewed and stable Respiratory status: spontaneous breathing, nonlabored ventilation, respiratory function stable and patient connected to nasal cannula oxygen Cardiovascular status: blood pressure returned to baseline and stable Postop Assessment: no signs of nausea or vomiting Anesthetic complications: no     Last Vitals:  Vitals:   08/27/16 0518 08/27/16 1459  BP: (!) 113/56 (!) 108/54  Pulse: 73 63  Resp: 18   Temp: 37.3 C 37.2 C    Last Pain:  Vitals:   08/27/16 1459  TempSrc: Oral  PainSc:                  Molli Barrows

## 2016-09-09 ENCOUNTER — Ambulatory Visit: Payer: Medicare Other | Admitting: Podiatry

## 2016-09-13 ENCOUNTER — Ambulatory Visit (INDEPENDENT_AMBULATORY_CARE_PROVIDER_SITE_OTHER): Payer: Medicare Other | Admitting: Podiatry

## 2016-09-13 DIAGNOSIS — M79609 Pain in unspecified limb: Secondary | ICD-10-CM

## 2016-09-13 DIAGNOSIS — E1142 Type 2 diabetes mellitus with diabetic polyneuropathy: Secondary | ICD-10-CM

## 2016-09-13 DIAGNOSIS — I739 Peripheral vascular disease, unspecified: Secondary | ICD-10-CM

## 2016-09-13 DIAGNOSIS — E0843 Diabetes mellitus due to underlying condition with diabetic autonomic (poly)neuropathy: Secondary | ICD-10-CM

## 2016-09-13 DIAGNOSIS — B351 Tinea unguium: Secondary | ICD-10-CM | POA: Diagnosis not present

## 2016-09-13 NOTE — Progress Notes (Signed)
Complaint:  Visit Type: Patient returns to my office for continued preventative foot care services. Complaint: Patient states" my nails have grown long and thick and become painful to walk and wear shoes" Patient has been diagnosed with DM with pvd and neuropathy.. The patient presents for preventative foot care services. No changes to ROS  Podiatric Exam: Vascular: dorsalis pedis are palpable  B/L. and posterior tibial pulses are not  palpable bilateral. Capillary return is immediate. Temperature gradient is WNL. Skin turgor WNL  Sensorium: Diminished  Semmes Weinstein monofilament test.  Nail Exam: Pt has thick disfigured discolored nails with subungual debris noted bilateral entire nail hallux through fifth toenails Ulcer Exam: There is no evidence of ulcer or pre-ulcerative changes or infection. Orthopedic Exam: Muscle tone and strength are WNL. No limitations in general ROM. No crepitus or effusions noted. Foot type and digits show no abnormalities. Bony prominences are unremarkable. HAV  B/L and hammer toe second right. Skin: No Porokeratosis. No infection or ulcers  Diagnosis:  Onychomycosis, , Pain in right toe, pain in left toes  Treatment & Plan Procedures and Treatment: Consent by patient was obtained for treatment procedures. The patient understood the discussion of treatment and procedures well. All questions were answered thoroughly reviewed. Debridement of mycotic and hypertrophic toenails, 1 through 5 bilateral and clearing of subungual debris. No ulceration, no infection noted. Patient says he is not interested in diabetic shoes. Return Visit-Office Procedure: Patient instructed to return to the office for a follow up visit 3 months for continued evaluation and treatment.    Gardiner Barefoot DPM

## 2016-09-29 ENCOUNTER — Encounter: Payer: Self-pay | Admitting: Gastroenterology

## 2016-09-29 ENCOUNTER — Telehealth: Payer: Self-pay | Admitting: Gastroenterology

## 2016-09-29 ENCOUNTER — Other Ambulatory Visit: Payer: Self-pay

## 2016-09-29 ENCOUNTER — Ambulatory Visit (INDEPENDENT_AMBULATORY_CARE_PROVIDER_SITE_OTHER): Payer: Medicare Other | Admitting: Gastroenterology

## 2016-09-29 ENCOUNTER — Telehealth: Payer: Self-pay

## 2016-09-29 VITALS — BP 126/82 | HR 89 | Temp 98.2°F | Resp 16 | Ht 71.0 in | Wt 225.8 lb

## 2016-09-29 DIAGNOSIS — K921 Melena: Secondary | ICD-10-CM

## 2016-09-29 NOTE — Progress Notes (Signed)
Primary Care Physician: Volanda Napoleon, MD  Primary Gastroenterologist:  Dr. [name]   Chief Complaint  Patient presents with  . GI Bleeding    HOSPITAL FOLLOW UP    HPI: Omar [name] is a 76 y.o. male  He is here today for a hospital follow up . He was admitted in 07/2016 for shortness of breath , CHF, found to have a high grade AV block .  And Dr Tiffany Kocher from [facility] was consulted for black tarry stools with a HB of 9 grams . He was at that time in the process of obtaining a pacemaker and the plan was for outpatient EGD+colonoscopy. He has had a right colectomy a year back for a polyp with high grade dysplasia in the ascending colon. He also had rectal AVM's treated with APC in 06/2015 . Marland Kitchen    Since discharge no further black tarry stool or blood in his stool. He is on plavix and asprin. Hre has had prostate cancer with radiation in the past . No shortness of breath presently.     Current Outpatient Prescriptions  Medication Sig Dispense Refill  . ALPRAZolam (XANAX) 0.25 MG tablet Take 0.25 mg by mouth 2 (two) times daily.    Marland Kitchen atorvastatin (LIPITOR) 80 MG tablet Take 80 mg by mouth at bedtime.     . busPIRone (BUSPAR) 5 MG tablet Take 5 mg by mouth 2 (two) times daily.     . Butalbital-APAP-Caffeine 50-300-40 MG CAPS Take 1 capsule by mouth every 6 (six) hours as needed (for migraines).     . cetirizine (ZYRTEC) 10 MG tablet Take 10 mg by mouth daily.    . cyclobenzaprine (FLEXERIL) 5 MG tablet Take 5 mg by mouth 3 (three) times daily as needed for muscle spasms.    . diphenoxylate-atropine (LOMOTIL) 2.5-0.025 MG tablet Take 1 tablet by mouth 4 (four) times daily as needed for diarrhea or loose stools.    . fluticasone (FLONASE) 50 MCG/ACT nasal spray Place 2 sprays into both nostrils daily as needed for rhinitis.     . Fluticasone Furoate-Vilanterol (BREO ELLIPTA) 100-25 MCG/INH AEPB Inhale 1 puff into the lungs daily.     . folic acid (FOLVITE) 1 MG tablet Take 1 mg  by mouth daily.    . furosemide (LASIX) 40 MG tablet Take 1 tablet (40 mg total) by mouth daily as needed for fluid or edema. 30 tablet 3  . gabapentin (NEURONTIN) 100 MG capsule Take 100 mg by mouth at bedtime.    . methotrexate (RHEUMATREX) 15 MG tablet Take 15 mg by mouth once a week. Caution: Chemotherapy. Protect from light.    Marland Kitchen oxyCODONE (OXY IR/ROXICODONE) 5 MG immediate release tablet Take 1 tablet (5 mg total) by mouth every 4 (four) hours as needed for moderate pain. 12 tablet 0  . pantoprazole (PROTONIX) 40 MG tablet Take 1 tablet (40 mg total) by mouth 2 (two) times daily. 60 tablet 3  . polyethylene glycol (MIRALAX / GLYCOLAX) packet Take 17 g by mouth daily as needed for mild constipation, moderate constipation or severe constipation. 14 each 0  . polyethylene glycol (MIRALAX / GLYCOLAX) packet Take by mouth.    . cephALEXin (KEFLEX) 250 MG capsule Take 1 capsule (250 mg total) by mouth every 6 (six) hours. (Patient not taking: Reported on 09/29/2016) 28 capsule 0  . CHERATUSSIN AC 100-10 MG/5ML syrup 4 (four) times daily as needed.     . fexofenadine (ALLEGRA) 180 MG tablet Take 180  mg by mouth daily.    . furosemide (LASIX) 40 MG tablet Take by mouth.    . isosorbide mononitrate (IMDUR) 30 MG 24 hr tablet Take 30 mg by mouth daily.    Marland Kitchen oxyCODONE (OXY IR/ROXICODONE) 5 MG immediate release tablet Take by mouth.    . predniSONE (DELTASONE) 10 MG tablet Take 1 tablet (10 mg total) by mouth daily with breakfast. Please take 6 pills in the morning on the days 1, 2 and 3, then taper by one pill daily until finished, thank you (Patient not taking: Reported on 09/29/2016) 33 tablet 0  . tamsulosin (FLOMAX) 0.4 MG CAPS capsule Take 1 capsule (0.4 mg total) by mouth daily. (Patient not taking: Reported on 08/21/2016) 30 capsule 3  . triamcinolone cream (KENALOG) 0.1 % Apply 1 application topically 2 (two) times daily.     No current facility-administered medications for this visit.      Allergies as of 09/29/2016  . (No Known Allergies)    ROS:  General: Negative for anorexia, weight loss, fever, chills, fatigue, weakness. ENT: Negative for hoarseness, difficulty swallowing , nasal congestion. CV: Negative for chest pain, angina, palpitations, dyspnea on exertion, peripheral edema.  Respiratory: Negative for dyspnea at rest, dyspnea on exertion, cough, sputum, wheezing.  GI: See history of present illness. GU:  Negative for dysuria, hematuria, urinary incontinence, urinary frequency, nocturnal urination.  Endo: Negative for unusual weight change.    Physical Examination:   BP 126/82 (BP Location: Right Arm, Patient Position: Sitting, Cuff Size: Large)   Pulse 89   Temp 98.2 F (36.8 C) (Oral)   Resp 16   Ht 5\' 11"  (1.803 m)   Wt 225 lb 12.8 oz (102.4 kg)   BMI 31.49 kg/m   General: Well-nourished, well-developed in no acute distress.  Eyes: No icterus. Conjunctivae pink. Mouth: Oropharyngeal mucosa moist and pink , no lesions erythema or exudate. Lungs: pacemaker below left clavicle Clear to auscultation bilaterally. Non-labored. Heart: Regular rate and rhythm, no murmurs rubs or gallops.  Abdomen: scars in center of abdomen from prior surgery Bowel sounds are normal, nontender, nondistended, no hepatosplenomegaly or masses, no abdominal bruits or hernia , no rebound or guarding.   Extremities: No lower extremity edema. No clubbing or deformities. Neuro: Alert and oriented x 3.  Grossly intact. Skin: Warm and dry, no jaundice.  Patches of skin discoloration seen all over body  Psych: Alert and cooperative, normal mood and affect.    Imaging Studies: No results found.  Assessment and Plan:   Omar [name] is a 76 y.o. y/o male here to follow up for tarry black stools since during recent hospitalization for shortness of breath. He was anemic. The bleeding has stopped and we will proceed with EGD+colonoscopy after cardiac clearance to hold Plavix. If  they cannot hold plavix we can still proceed but will be unable to resect any large polyps or perform any APC for any AVM's.   I have discussed alternative options, risks & benefits,  which include, but are not limited to, bleeding, infection, perforation,respiratory complication & drug reaction.  The patient agrees with this plan & written consent will be obtained.     Dr [name]  MD Follow up as needed

## 2016-09-29 NOTE — Telephone Encounter (Signed)
Gastroenterology Pre-Procedure Review  Request Date: 5/24 Requesting Physician: Dr. Vicente Males  PATIENT REVIEW QUESTIONS: The patient responded to the following health history questions as indicated:    1. Are you having any GI issues? yes (Melena) 2. Do you have a personal history of Polyps? yes (removed) 3. Do you have a family history of Colon Cancer or Polyps? no 4. Diabetes Mellitus? yes (Type II) 5. Joint replacements in the past 12 months?no 6. Major health problems in the past 3 months?yes (pacemaker implanted) 7. Any artificial heart valves, MVP, or defibrillator?yes (pacemaker)    MEDICATIONS & ALLERGIES:    Patient reports the following regarding taking any anticoagulation/antiplatelet therapy:   Plavix, Coumadin, Eliquis, Xarelto, Lovenox, Pradaxa, Brilinta, or Effient? yes (PLAVIX) Aspirin? yes (81mg )  Patient confirms/reports the following medications:  Current Outpatient Prescriptions  Medication Sig Dispense Refill  . ALPRAZolam (XANAX) 0.25 MG tablet Take 0.25 mg by mouth 2 (two) times daily.    Marland Kitchen atorvastatin (LIPITOR) 80 MG tablet Take 80 mg by mouth at bedtime.     . busPIRone (BUSPAR) 5 MG tablet Take 5 mg by mouth 2 (two) times daily.     . Butalbital-APAP-Caffeine 50-300-40 MG CAPS Take 1 capsule by mouth every 6 (six) hours as needed (for migraines).     . cephALEXin (KEFLEX) 250 MG capsule Take 1 capsule (250 mg total) by mouth every 6 (six) hours. (Patient not taking: Reported on 09/29/2016) 28 capsule 0  . cetirizine (ZYRTEC) 10 MG tablet Take 10 mg by mouth daily.    Marland Kitchen CHERATUSSIN AC 100-10 MG/5ML syrup 4 (four) times daily as needed.     . cyclobenzaprine (FLEXERIL) 5 MG tablet Take 5 mg by mouth 3 (three) times daily as needed for muscle spasms.    . diphenoxylate-atropine (LOMOTIL) 2.5-0.025 MG tablet Take 1 tablet by mouth 4 (four) times daily as needed for diarrhea or loose stools.    . fexofenadine (ALLEGRA) 180 MG tablet Take 180 mg by mouth daily.    .  fluticasone (FLONASE) 50 MCG/ACT nasal spray Place 2 sprays into both nostrils daily as needed for rhinitis.     . Fluticasone Furoate-Vilanterol (BREO ELLIPTA) 100-25 MCG/INH AEPB Inhale 1 puff into the lungs daily.     . folic acid (FOLVITE) 1 MG tablet Take 1 mg by mouth daily.    . furosemide (LASIX) 40 MG tablet Take 1 tablet (40 mg total) by mouth daily as needed for fluid or edema. 30 tablet 3  . furosemide (LASIX) 40 MG tablet Take by mouth.    . gabapentin (NEURONTIN) 100 MG capsule Take 100 mg by mouth at bedtime.    . isosorbide mononitrate (IMDUR) 30 MG 24 hr tablet Take 30 mg by mouth daily.    . methotrexate (RHEUMATREX) 15 MG tablet Take 15 mg by mouth once a week. Caution: Chemotherapy. Protect from light.    Marland Kitchen oxyCODONE (OXY IR/ROXICODONE) 5 MG immediate release tablet Take 1 tablet (5 mg total) by mouth every 4 (four) hours as needed for moderate pain. 12 tablet 0  . oxyCODONE (OXY IR/ROXICODONE) 5 MG immediate release tablet Take by mouth.    . pantoprazole (PROTONIX) 40 MG tablet Take 1 tablet (40 mg total) by mouth 2 (two) times daily. 60 tablet 3  . polyethylene glycol (MIRALAX / GLYCOLAX) packet Take 17 g by mouth daily as needed for mild constipation, moderate constipation or severe constipation. 14 each 0  . polyethylene glycol (MIRALAX / GLYCOLAX) packet Take by mouth.    Marland Kitchen  predniSONE (DELTASONE) 10 MG tablet Take 1 tablet (10 mg total) by mouth daily with breakfast. Please take 6 pills in the morning on the days 1, 2 and 3, then taper by one pill daily until finished, thank you (Patient not taking: Reported on 09/29/2016) 33 tablet 0  . tamsulosin (FLOMAX) 0.4 MG CAPS capsule Take 1 capsule (0.4 mg total) by mouth daily. (Patient not taking: Reported on 08/21/2016) 30 capsule 3  . triamcinolone cream (KENALOG) 0.1 % Apply 1 application topically 2 (two) times daily.     No current facility-administered medications for this visit.     Patient confirms/reports the following  allergies:  No Known Allergies  No orders of the defined types were placed in this encounter.   AUTHORIZATION INFORMATION Primary Insurance: 1D#: Group #:  Secondary Insurance: 1D#: Group #:  SCHEDULE INFORMATION: Date: 5/24 Time: Location: [place]

## 2016-09-29 NOTE — Telephone Encounter (Signed)
09/29/16 Spoke with Star at [facility] and NO auth is required for EGD [place] [facility] Colonoscopy [address] K92.1

## 2016-10-27 ENCOUNTER — Encounter: Payer: Self-pay | Admitting: *Deleted

## 2016-10-28 ENCOUNTER — Encounter: Payer: Self-pay | Admitting: *Deleted

## 2016-10-28 ENCOUNTER — Ambulatory Visit: Payer: Medicare Other | Admitting: Anesthesiology

## 2016-10-28 ENCOUNTER — Encounter
Admission: RE | Disposition: A | Discharge status: Home / Self Care | Payer: Self-pay | Source: Ambulatory Visit | Attending: Gastroenterology

## 2016-10-28 ENCOUNTER — Ambulatory Visit
Admission: RE | Admit: 2016-10-28 | Discharge: 2016-10-28 | Disposition: A | Discharge status: Home / Self Care | Payer: Medicare Other | Source: Ambulatory Visit | Attending: Gastroenterology | Admitting: Gastroenterology

## 2016-10-28 DIAGNOSIS — K627 Radiation proctitis: Secondary | ICD-10-CM

## 2016-10-28 DIAGNOSIS — I4891 Unspecified atrial fibrillation: Secondary | ICD-10-CM | POA: Diagnosis not present

## 2016-10-28 DIAGNOSIS — Z950 Presence of cardiac pacemaker: Secondary | ICD-10-CM | POA: Diagnosis not present

## 2016-10-28 DIAGNOSIS — K222 Esophageal obstruction: Secondary | ICD-10-CM

## 2016-10-28 DIAGNOSIS — K210 Gastro-esophageal reflux disease with esophagitis: Secondary | ICD-10-CM | POA: Insufficient documentation

## 2016-10-28 DIAGNOSIS — K2970 Gastritis, unspecified, without bleeding: Secondary | ICD-10-CM | POA: Insufficient documentation

## 2016-10-28 DIAGNOSIS — K640 First degree hemorrhoids: Secondary | ICD-10-CM | POA: Insufficient documentation

## 2016-10-28 DIAGNOSIS — I10 Essential (primary) hypertension: Secondary | ICD-10-CM | POA: Diagnosis not present

## 2016-10-28 DIAGNOSIS — D125 Benign neoplasm of sigmoid colon: Secondary | ICD-10-CM | POA: Diagnosis not present

## 2016-10-28 DIAGNOSIS — G4730 Sleep apnea, unspecified: Secondary | ICD-10-CM | POA: Diagnosis not present

## 2016-10-28 DIAGNOSIS — Z923 Personal history of irradiation: Secondary | ICD-10-CM | POA: Diagnosis not present

## 2016-10-28 DIAGNOSIS — K921 Melena: Secondary | ICD-10-CM | POA: Diagnosis not present

## 2016-10-28 DIAGNOSIS — Z800 Family history of malignant neoplasm of digestive organs: Secondary | ICD-10-CM | POA: Insufficient documentation

## 2016-10-28 DIAGNOSIS — K209 Esophagitis, unspecified: Secondary | ICD-10-CM | POA: Diagnosis not present

## 2016-10-28 DIAGNOSIS — D124 Benign neoplasm of descending colon: Secondary | ICD-10-CM | POA: Diagnosis not present

## 2016-10-28 DIAGNOSIS — Z8546 Personal history of malignant neoplasm of prostate: Secondary | ICD-10-CM | POA: Insufficient documentation

## 2016-10-28 DIAGNOSIS — M069 Rheumatoid arthritis, unspecified: Secondary | ICD-10-CM | POA: Diagnosis not present

## 2016-10-28 DIAGNOSIS — D123 Benign neoplasm of transverse colon: Secondary | ICD-10-CM

## 2016-10-28 DIAGNOSIS — Z79899 Other long term (current) drug therapy: Secondary | ICD-10-CM | POA: Diagnosis not present

## 2016-10-28 DIAGNOSIS — Z808 Family history of malignant neoplasm of other organs or systems: Secondary | ICD-10-CM | POA: Insufficient documentation

## 2016-10-28 DIAGNOSIS — K5730 Diverticulosis of large intestine without perforation or abscess without bleeding: Secondary | ICD-10-CM

## 2016-10-28 DIAGNOSIS — K31819 Angiodysplasia of stomach and duodenum without bleeding: Secondary | ICD-10-CM | POA: Insufficient documentation

## 2016-10-28 DIAGNOSIS — K2990 Gastroduodenitis, unspecified, without bleeding: Secondary | ICD-10-CM | POA: Diagnosis not present

## 2016-10-28 DIAGNOSIS — Z9049 Acquired absence of other specified parts of digestive tract: Secondary | ICD-10-CM | POA: Diagnosis not present

## 2016-10-28 DIAGNOSIS — K2980 Duodenitis without bleeding: Secondary | ICD-10-CM | POA: Diagnosis not present

## 2016-10-28 DIAGNOSIS — E7800 Pure hypercholesterolemia, unspecified: Secondary | ICD-10-CM | POA: Insufficient documentation

## 2016-10-28 DIAGNOSIS — Z7982 Long term (current) use of aspirin: Secondary | ICD-10-CM | POA: Diagnosis not present

## 2016-10-28 DIAGNOSIS — E119 Type 2 diabetes mellitus without complications: Secondary | ICD-10-CM | POA: Diagnosis not present

## 2016-10-28 DIAGNOSIS — F17210 Nicotine dependence, cigarettes, uncomplicated: Secondary | ICD-10-CM | POA: Insufficient documentation

## 2016-10-28 DIAGNOSIS — F419 Anxiety disorder, unspecified: Secondary | ICD-10-CM | POA: Insufficient documentation

## 2016-10-28 DIAGNOSIS — Z7984 Long term (current) use of oral hypoglycemic drugs: Secondary | ICD-10-CM | POA: Insufficient documentation

## 2016-10-28 DIAGNOSIS — Z833 Family history of diabetes mellitus: Secondary | ICD-10-CM | POA: Insufficient documentation

## 2016-10-28 DIAGNOSIS — Z803 Family history of malignant neoplasm of breast: Secondary | ICD-10-CM | POA: Insufficient documentation

## 2016-10-28 HISTORY — PX: ESOPHAGOGASTRODUODENOSCOPY (EGD) WITH PROPOFOL: SHX5813

## 2016-10-28 HISTORY — PX: COLONOSCOPY WITH PROPOFOL: SHX5780

## 2016-10-28 LAB — GLUCOSE, CAPILLARY: Glucose-Capillary: 105 mg/dL — ABNORMAL HIGH (ref 65–99)

## 2016-10-28 SURGERY — ESOPHAGOGASTRODUODENOSCOPY (EGD) WITH PROPOFOL
Anesthesia: General

## 2016-10-28 MED ORDER — MIDAZOLAM HCL 2 MG/2ML IJ SOLN
INTRAMUSCULAR | Status: AC
  Filled 2016-10-28: qty 2

## 2016-10-28 MED ORDER — PHENYLEPHRINE HCL 10 MG/ML IJ SOLN
INTRAMUSCULAR | Status: DC | PRN
  Administered 2016-10-28 (×2): 100 ug via INTRAVENOUS

## 2016-10-28 MED ORDER — SODIUM CHLORIDE 0.9 % IV SOLN
INTRAVENOUS | Status: DC
  Administered 2016-10-28: 1000 mL via INTRAVENOUS

## 2016-10-28 MED ORDER — FENTANYL CITRATE (PF) 100 MCG/2ML IJ SOLN
INTRAMUSCULAR | Status: DC | PRN
  Administered 2016-10-28: 50 ug via INTRAVENOUS

## 2016-10-28 MED ORDER — PROPOFOL 500 MG/50ML IV EMUL
INTRAVENOUS | Status: AC
  Filled 2016-10-28: qty 50

## 2016-10-28 MED ORDER — SODIUM CHLORIDE 0.9 % IJ SOLN
INTRAMUSCULAR | Status: AC
  Filled 2016-10-28: qty 10

## 2016-10-28 MED ORDER — LIDOCAINE 2% (20 MG/ML) 5 ML SYRINGE
INTRAMUSCULAR | Status: DC | PRN
  Administered 2016-10-28: 40 mg via INTRAVENOUS

## 2016-10-28 MED ORDER — EPHEDRINE SULFATE 50 MG/ML IJ SOLN
INTRAMUSCULAR | Status: AC
  Filled 2016-10-28: qty 1

## 2016-10-28 MED ORDER — FENTANYL CITRATE (PF) 100 MCG/2ML IJ SOLN
INTRAMUSCULAR | Status: AC
  Filled 2016-10-28: qty 2

## 2016-10-28 MED ORDER — PROPOFOL 10 MG/ML IV BOLUS
INTRAVENOUS | Status: DC | PRN
  Administered 2016-10-28: 100 mg via INTRAVENOUS

## 2016-10-28 MED ORDER — PROPOFOL 10 MG/ML IV BOLUS
INTRAVENOUS | Status: AC
  Filled 2016-10-28: qty 20

## 2016-10-28 MED ORDER — PROPOFOL 500 MG/50ML IV EMUL
INTRAVENOUS | Status: DC | PRN
  Administered 2016-10-28: 160 ug/kg/min via INTRAVENOUS

## 2016-10-28 MED ORDER — MIDAZOLAM HCL 5 MG/5ML IJ SOLN
INTRAMUSCULAR | Status: DC | PRN
  Administered 2016-10-28: 1 mg via INTRAVENOUS

## 2016-10-28 MED ORDER — LIDOCAINE HCL 2 % IJ SOLN
INTRAMUSCULAR | Status: AC
  Filled 2016-10-28: qty 10

## 2016-10-28 NOTE — H&P (Signed)
Omar Bellows MD [address], [place], [place] [address] Phone: [phone] Fax : [phone]  Primary Care Physician:  Omar Marble, MD Primary Gastroenterologist:  Dr. Jonathon [name]   Pre-Procedure History & Physical: HPI:  Omar [name] is a 76 y.o. male is here for an endoscopy and colonoscopy.   Past Medical History:  Diagnosis Date  . Anxiety   . Arthritis    rheumatoid  . Atrial fibrillation (HCC)    hx of  . Colon polyp 07-07-15   TUBULAR ADENOMA WITH AT LEAST HIGH-GRADE / Dr Omar [name]  . Diabetes mellitus without complication ([place])   . Dysrhythmia    bradycardia...Marland KitchenMarland Kitchen2 to 1 heart block  . GERD (gastroesophageal reflux disease)   . Heart murmur    patient unaware of history of murmur  . Hypercholesterolemia   . Hypertension   . Prostate cancer ([place]) 01/01/13, 01/30/14   Gleason 3+4=7, volume 46.6 cc  . Rheumatoid arthritis ([place])   . S/P radiation therapy  04/03/2014 through 06/04/2014                                                      Prostate 7800 cGy in 40 sessions                          . Sleep apnea    uses cpap but it is not in the hospital with him    Past Surgical History:  Procedure Laterality Date  . ABDOMINAL AORTIC ANEURYSM REPAIR  11/2013  . COLON SURGERY  March 2017   Right hemicolectomy for tubulovillous adenoma with high-grade dysplasia.  . COLONOSCOPY WITH PROPOFOL N/A 07/07/2015   Procedure: COLONOSCOPY WITH PROPOFOL;  Surgeon: Omar Class, MD;  Location: [facility] ENDOSCOPY;  Service: Endoscopy;  Laterality: N/A;  . LAPAROSCOPIC RIGHT COLECTOMY Right 08/08/2015   Procedure: LAPAROSCOPIC RIGHT COLECTOMY;  Surgeon: Omar Bellow, MD;  Location: [facility] ORS;  Service: General;  Laterality: Right;  . NASAL SINUS SURGERY    . PACEMAKER INSERTION Left 08/26/2016   Procedure: INSERTION PACEMAKER;  Surgeon: Omar Cowman, MD;  Location: [facility] ORS;  Service: Cardiovascular;  Laterality: Left;  . PROSTATE BIOPSY  01/01/13, 01/30/14   Gleason 3+3=6, vol 46.6 cc  . TONSILLECTOMY    . uvula surgery     for sleep apnea    Prior to Admission medications   Medication Sig Start Date End Date Taking? Authorizing Provider  ALPRAZolam (XANAX) 0.25 MG tablet Take 0.25 mg by mouth 2 (two) times daily.   Yes [provider]  aspirin 81 MG chewable tablet Chew 81 mg by mouth daily.   Yes [provider]  atorvastatin (LIPITOR) 80 MG tablet Take 80 mg by mouth at bedtime.    Yes [provider]  busPIRone (BUSPAR) 5 MG tablet Take 5 mg by mouth 2 (two) times daily.    Yes [provider]  Butalbital-APAP-Caffeine 50-300-40 MG CAPS Take 1 capsule by mouth every 6 (six) hours as needed (for migraines).    Yes [provider]  cephALEXin (KEFLEX) 250 MG capsule Take 1 capsule (250 mg total) by mouth every 6 (six) hours. 08/27/16  Yes Omar Grist, MD  cetirizine (ZYRTEC) 10 MG tablet Take 10 mg by mouth daily.   Yes [provider]  [facility]  100-10 MG/5ML syrup 4 (four) times daily as needed.  11/11/15  Yes [provider]  cyclobenzaprine (FLEXERIL) 5 MG tablet Take 5 mg by mouth 3 (three) times daily as needed for muscle spasms.   Yes [provider]  diphenoxylate-atropine (LOMOTIL) 2.5-0.025 MG tablet Take 1 tablet by mouth 4 (four) times daily as needed for diarrhea or loose stools.   Yes [provider]  fexofenadine (ALLEGRA) 180 MG tablet Take 180 mg by mouth daily.   Yes [provider]  fluticasone (FLONASE) 50 MCG/ACT nasal spray Place 2 sprays into both nostrils daily as needed for rhinitis.    Yes [provider]  Fluticasone Furoate-Vilanterol (BREO ELLIPTA) 100-25 MCG/INH AEPB Inhale 1 puff into the lungs daily.    Yes [provider]  folic acid (FOLVITE) 1 MG tablet Take 1 mg by mouth daily.   Yes [provider]  furosemide (LASIX) 40 MG tablet Take 1 tablet (40 mg total) by mouth daily as needed for  fluid or edema. 08/27/16  Yes Omar Grist, MD  furosemide (LASIX) 40 MG tablet Take by mouth. 08/27/16  Yes [provider]  gabapentin (NEURONTIN) 100 MG capsule Take 100 mg by mouth at bedtime.   Yes [provider]  isosorbide mononitrate (IMDUR) 30 MG 24 hr tablet Take 30 mg by mouth daily.   Yes [provider]  metFORMIN (GLUCOPHAGE) 500 MG tablet Take 500 mg by mouth 2 (two) times daily with a meal.   Yes [provider]  methotrexate (RHEUMATREX) 15 MG tablet Take 15 mg by mouth once a week. Caution: Chemotherapy. Protect from light. 08/31/16  Yes [provider]  oxyCODONE (OXY IR/ROXICODONE) 5 MG immediate release tablet Take 1 tablet (5 mg total) by mouth every 4 (four) hours as needed for moderate pain. 08/27/16  Yes Omar Grist, MD  oxyCODONE (OXY IR/ROXICODONE) 5 MG immediate release tablet Take by mouth. 08/27/16  Yes [provider]  pantoprazole (PROTONIX) 40 MG tablet Take 1 tablet (40 mg total) by mouth 2 (two) times daily. 08/27/16  Yes Omar Grist, MD  polyethylene glycol (MIRALAX / GLYCOLAX) packet Take 17 g by mouth daily as needed for mild constipation, moderate constipation or severe constipation. 08/27/16  Yes Omar Grist, MD  polyethylene glycol (MIRALAX / GLYCOLAX) packet Take by mouth. 08/27/16  Yes [provider]  predniSONE (DELTASONE) 10 MG tablet Take 1 tablet (10 mg total) by mouth daily with breakfast. Please take 6 pills in the morning on the days 1, 2 and 3, then taper by one pill daily until finished, thank you 08/27/16  Yes Omar Grist, MD  tamsulosin (FLOMAX) 0.4 MG CAPS capsule Take 1 capsule (0.4 mg total) by mouth daily. 06/03/14  Yes Omar Koh, MD  triamcinolone cream (KENALOG) 0.1 % Apply 1 application topically 2 (two) times daily.   Yes [provider]    Allergies as of 09/29/2016  . (No Known Allergies)    Family History  Problem Relation Age of Onset  . Heart  attack Mother   . Cirrhosis Father   . Cancer Brother        pancreatic  . Diabetes Brother   . Diabetes Daughter        medication induced for cancer treatments  . Cancer Daughter        breast, brain    Social History   Social History  . Marital status: Divorced    Spouse name: N/A  . Number of children:  N/A  . Years of education: N/A   Occupational History  . Not on file.   Social History Main Topics  . Smoking status: Current Every Day Smoker    Packs/day: 0.50    Years: 50.00    Types: Cigarettes  . Smokeless tobacco: Never Used     Comment: DOWN TO 1/2 PPD  . Alcohol use 0.6 oz/week    1 Cans of beer per week     Comment: occasionally  . Drug use: No  . Sexual activity: Not on file   Other Topics Concern  . Not on file   Social History Narrative  . No narrative on file    Review of Systems: See HPI, otherwise negative ROS  Physical Exam: BP 121/65   Pulse 90   Temp 97.2 F (36.2 C) (Tympanic)   Resp 20   Ht 5\' 11"  (1.803 m)   Wt 221 lb (100.2 kg)   SpO2 100%   BMI 30.82 kg/m  General:   Alert,  pleasant and cooperative in NAD Head:  Normocephalic and atraumatic. Neck:  Supple; no masses or thyromegaly. Lungs:  Clear throughout to auscultation.    Heart:  Regular rate and rhythm. Abdomen:  Soft, nontender and nondistended. Normal bowel sounds, without guarding, and without rebound.   Neurologic:  Alert and  oriented x4;  grossly normal neurologically.  Impression/Plan: Omar [name] is here for an endoscopy and colonoscopy to be performed for melena   Risks, benefits, limitations, and alternatives regarding  endoscopy and colonoscopy have been reviewed with the patient.  Questions have been answered.  All parties agreeable.   Omar Bellows, MD  10/28/2016, 7:41 AM

## 2016-10-28 NOTE — Anesthesia Postprocedure Evaluation (Signed)
Anesthesia Post Note  Patient: Omar [name]  Procedure(s) Performed: Procedure(s) (LRB): ESOPHAGOGASTRODUODENOSCOPY (EGD) WITH PROPOFOL (N/A) COLONOSCOPY WITH PROPOFOL (N/A)  Patient location during evaluation: Endoscopy Anesthesia Type: General Level of consciousness: awake and alert and oriented Pain management: pain level controlled Vital Signs Assessment: post-procedure vital signs reviewed and stable Respiratory status: spontaneous breathing, nonlabored ventilation and respiratory function stable Cardiovascular status: blood pressure returned to baseline and stable Postop Assessment: no signs of nausea or vomiting Anesthetic complications: no     Last Vitals:  Vitals:   10/28/16 0903 10/28/16 0913  BP: (!) 146/101 (!) 151/83  Pulse: (!) 51 90  Resp: 16 18  Temp:      Last Pain:  Vitals:   10/28/16 0823  TempSrc: Tympanic  PainSc: Asleep                 Azaya Goedde

## 2016-10-28 NOTE — Transfer of Care (Signed)
Immediate Anesthesia Transfer of Care Note  Patient: Omar [name]  Procedure(s) Performed: Procedure(s): ESOPHAGOGASTRODUODENOSCOPY (EGD) WITH PROPOFOL (N/A) COLONOSCOPY WITH PROPOFOL (N/A)  Patient Location: PACU and Endoscopy Unit  Anesthesia Type:General  Level of Consciousness: sedated  Airway & Oxygen Therapy: Patient Spontanous Breathing and Patient connected to nasal cannula oxygen  Post-op Assessment: Report given to RN and Post -op Vital signs reviewed and stable  Post vital signs: Reviewed and stable  Last Vitals:  Vitals:   10/28/16 0716  BP: 121/65  Pulse: 90  Resp: 20  Temp: 36.2 C    Last Pain:  Vitals:   10/28/16 0716  TempSrc: Tympanic         Complications: No apparent anesthesia complications

## 2016-10-28 NOTE — Anesthesia Preprocedure Evaluation (Signed)
Anesthesia Evaluation  Patient identified by MRN, date of birth, ID band Patient awake    Reviewed: Allergy & Precautions, NPO status , Patient's Chart, lab work & pertinent test results  History of Anesthesia Complications Negative for: history of anesthetic complications  Airway Mallampati: II  TM Distance: >3 FB Neck ROM: Full    Dental no notable dental hx.    Pulmonary sleep apnea , neg COPD, Current Smoker,    breath sounds clear to auscultation- rhonchi (-) wheezing      Cardiovascular Exercise Tolerance: Good hypertension, Pt. on medications (-) CAD and (-) Past MI + dysrhythmias + pacemaker (symptomatic bradycardia)  Rhythm:Regular Rate:Normal - Systolic murmurs and - Diastolic murmurs    Neuro/Psych    GI/Hepatic Neg liver ROS, GERD  ,  Endo/Other  diabetes, Oral Hypoglycemic Agents  Renal/GU Renal InsufficiencyRenal disease     Musculoskeletal  (+) Arthritis , Rheumatoid disorders,    Abdominal (+) + obese,   Peds  Hematology  (+) anemia ,   Anesthesia Other Findings Past Medical History: No date: Anxiety No date: Arthritis     Comment: rheumatoid No date: Atrial fibrillation (HCC)     Comment: hx of 07-07-15: Colon polyp     Comment: TUBULAR ADENOMA WITH AT LEAST HIGH-GRADE / Dr               Rayann Heman No date: Diabetes mellitus without complication ([place]) No date: Dysrhythmia     Comment: bradycardia...Marland KitchenMarland Kitchen2 to 1 heart block No date: GERD (gastroesophageal reflux disease) No date: Heart murmur     Comment: patient unaware of history of murmur No date: Hypercholesterolemia No date: Hypertension 01/01/13, 01/30/14: Prostate cancer ([place])     Comment: Gleason 3+4=7, volume 46.6 cc No date: Rheumatoid arthritis ([place])  04/03/2014 through 06/04/2014                                                   : S/P radiation therapy     Comment: Prostate 7800 cGy in 40 sessions               No date: Sleep apnea  Comment: uses cpap but it is not in the hospital with               him   Reproductive/Obstetrics                             Anesthesia Physical Anesthesia Plan  ASA: III  Anesthesia Plan: General   Post-op Pain Management:    Induction: Intravenous  Airway Management Planned: Natural Airway  Additional Equipment:   Intra-op Plan:   Post-operative Plan:   Informed Consent: I have reviewed the patients History and Physical, chart, labs and discussed the procedure including the risks, benefits and alternatives for the proposed anesthesia with the patient or authorized representative who has indicated his/her understanding and acceptance.   Dental advisory given  Plan Discussed with: CRNA and Anesthesiologist  Anesthesia Plan Comments:         Anesthesia Quick Evaluation

## 2016-10-28 NOTE — Anesthesia Post-op Follow-up Note (Cosign Needed)
Anesthesia QCDR form completed.        

## 2016-10-28 NOTE — Op Note (Signed)
[facility] Gastroenterology Patient Name: Omar [name] Procedure Date: 10/28/2016 7:36 AM MRN: [id number] Account #: [id number] Date of Birth: 03/06/1941 Admit Type: Outpatient Age: 76 Room: [facility] ENDO ROOM 4 Gender: Male Note Status: Finalized Procedure:            Colonoscopy Providers:            [name] MD, MD Referring MD:         Venetia Maxon. Elijio Miles, MD (Referring MD) Medicines:            Monitored Anesthesia Care Complications:        No immediate complications. Procedure:            Pre-Anesthesia Assessment:                       - Prior to the procedure, a History and Physical was                        performed, and patient medications, allergies and                        sensitivities were reviewed. The patient's tolerance of                        previous anesthesia was reviewed.                       - The risks and benefits of the procedure and the                        sedation options and risks were discussed with the                        patient. All questions were answered and informed                        consent was obtained.                       - ASA Grade Assessment: III - A patient with severe                        systemic disease.                       After obtaining informed consent, the colonoscope was                        passed under direct vision. Throughout the procedure,                        the patient's blood pressure, pulse, and oxygen                        saturations were monitored continuously. The                        Colonoscope was introduced through the anus and                        advanced to the the ileocolonic anastomosis. The  colonoscopy was performed with ease. The patient                        tolerated the procedure well. The quality of the bowel                        preparation was good. Findings:      Non-bleeding internal hemorrhoids were found during  retroflexion. The       hemorrhoids were medium-sized and Grade I (internal hemorrhoids that do       not prolapse).      Multiple medium-sized diffuse angioectasias without bleeding were found       in the rectum. They were non bleeding and appearance suggestive of       radiation proctitis - very close to the anal verge      Multiple small-mouthed diverticula were found in the entire colon.      Three sessile polyps were found in the sigmoid colon, descending colon       and transverse colon. The polyps were 3 to 6 mm in size. These polyps       were removed with a cold snare. Resection and retrieval were complete.      The exam was otherwise without abnormality on direct and retroflexion       views. Impression:           - Non-bleeding internal hemorrhoids.                       - Multiple non-bleeding colonic angioectasias.                       - Diverticulosis in the entire examined colon.                       - Three 3 to 6 mm polyps in the sigmoid colon, in the                        descending colon and in the transverse colon, removed                        with a cold snare. Resected and retrieved.                       - The examination was otherwise normal on direct and                        retroflexion views. Recommendation:       - Discharge patient to home (with escort).                       - Resume previous diet.                       - Continue present medications.                       - Await pathology results.                       - Return to my office PRN. Procedure Code(s):    --- Professional ---  45385, Colonoscopy, flexible; with removal of tumor(s),                        polyp(s), or other lesion(s) by snare technique Diagnosis Code(s):    --- Professional ---                       K55.20, Angiodysplasia of colon without hemorrhage                       K64.0, First degree hemorrhoids                       D12.5, Benign neoplasm  of sigmoid colon                       D12.4, Benign neoplasm of descending colon                       D12.3, Benign neoplasm of transverse colon (hepatic                        flexure or splenic flexure)                       K57.30, Diverticulosis of large intestine without                        perforation or abscess without bleeding CPT copyright 2016 [employer]. All rights reserved. The codes documented in this report are preliminary and upon coder review may  be revised to meet current compliance requirements. [name], MD [name] MD, MD 10/28/2016 8:21:17 AM This report has been signed electronically. Number of Addenda: 0 Note Initiated On: 10/28/2016 7:36 AM Scope Withdrawal Time: 0 hours 15 minutes 29 seconds  Total Procedure Duration: 0 hours 18 minutes 35 seconds       [facility]

## 2016-10-28 NOTE — Op Note (Signed)
[facility] Gastroenterology Patient Name: Omar [name] Procedure Date: 10/28/2016 7:37 AM MRN: [id number] Account #: [id number] Date of Birth: [birth year] Admit Type: Outpatient Age: 76 Room: [facility] ENDO ROOM 4 Gender: Male Note Status: Finalized Procedure:            Upper GI endoscopy Indications:          Melena Providers:            [name] MD, MD Referring MD:         Venetia Maxon. Elijio Miles, MD (Referring MD) Medicines:            Monitored Anesthesia Care Complications:        No immediate complications. Procedure:            Pre-Anesthesia Assessment:                       - Prior to the procedure, a History and Physical was                        performed, and patient medications, allergies and                        sensitivities were reviewed. The patient's tolerance of                        previous anesthesia was reviewed.                       - The risks and benefits of the procedure and the                        sedation options and risks were discussed with the                        patient. All questions were answered and informed                        consent was obtained.                       - ASA Grade Assessment: III - A patient with severe                        systemic disease.                       After obtaining informed consent, the endoscope was                        passed under direct vision. Throughout the procedure,                        the patient's blood pressure, pulse, and oxygen                        saturations were monitored continuously. The Endoscope                        was introduced through the mouth, and advanced to the  third part of duodenum. The upper GI endoscopy was                        accomplished with ease. The patient tolerated the                        procedure well. Findings:      Localized moderate inflammation characterized by congestion (edema) and       erythema  was found in the duodenal bulb. Biopsies were taken with a cold       forceps for histology.      Patchy mild inflammation characterized by adherent blood, congestion       (edema) and erythema was found in the entire examined stomach. Biopsies       were taken with a cold forceps for histology.      LA Grade A (one or more mucosal breaks less than 5 mm, not extending       between tops of 2 mucosal folds) esophagitis with no bleeding was found       in the lower third of the esophagus. Biopsies were taken with a cold       forceps for histology.      One mild benign-appearing, intrinsic stenosis was found at the       gastroesophageal junction. This measured 1.5 cm (inner diameter) x less       than one cm (in length) and was traversed. No dilation performed as he       has had no dysphagia Impression:           - Duodenitis. Biopsied.                       - Gastritis. Biopsied.                       - LA Grade A reflux esophagitis. Biopsied.                       - Benign-appearing esophageal stenosis. Recommendation:       - Await pathology results.                       - Perform a colonoscopy today.                       - Use Prilosec (omeprazole) 40 mg PO daily for 8 weeks.                       - No ibuprofen, naproxen, or other non-steroidal                        anti-inflammatory drugs. Procedure Code(s):    --- Professional ---                       [phone], Esophagogastroduodenoscopy, flexible, transoral;                        with biopsy, single or multiple Diagnosis Code(s):    --- Professional ---                       K29.80, Duodenitis without bleeding  K29.70, Gastritis, unspecified, without bleeding                       K21.0, Gastro-esophageal reflux disease with esophagitis                       K22.2, Esophageal obstruction                       K92.1, Melena (includes Hematochezia) CPT copyright 2016 American Medical Association. All rights  reserved. The codes documented in this report are preliminary and upon coder review may  be revised to meet current compliance requirements. [name], MD [name] MD, MD 10/28/2016 7:57:39 AM This report has been signed electronically. Number of Addenda: 0 Note Initiated On: 10/28/2016 7:37 AM      [facility]

## 2016-10-29 ENCOUNTER — Encounter: Payer: Self-pay | Admitting: Gastroenterology

## 2016-10-29 LAB — SURGICAL PATHOLOGY

## 2016-11-03 ENCOUNTER — Telehealth: Payer: Self-pay

## 2016-11-03 NOTE — Telephone Encounter (Signed)
-----   Message from [name], MD sent at 11/01/2016  6:48 PM EDT ----- Inform esophaguitis and a few tubular adeniomas seen . Suggest repeat colonoscopy in 3 years and continue PPI for GERD

## 2016-11-03 NOTE — Telephone Encounter (Signed)
Advised patient of results per Dr. Vicente Males.   Inform esophaguitis and a few tubular adeniomas seen . Suggest repeat colonoscopy in 3 years and continue PPI for GERD

## 2016-11-19 ENCOUNTER — Other Ambulatory Visit (INDEPENDENT_AMBULATORY_CARE_PROVIDER_SITE_OTHER): Payer: Self-pay | Admitting: Vascular Surgery

## 2016-11-19 DIAGNOSIS — I714 Abdominal aortic aneurysm, without rupture, unspecified: Secondary | ICD-10-CM

## 2016-11-19 DIAGNOSIS — I70219 Atherosclerosis of native arteries of extremities with intermittent claudication, unspecified extremity: Secondary | ICD-10-CM

## 2016-11-23 ENCOUNTER — Ambulatory Visit (INDEPENDENT_AMBULATORY_CARE_PROVIDER_SITE_OTHER): Payer: Medicare Other

## 2016-11-23 ENCOUNTER — Ambulatory Visit (INDEPENDENT_AMBULATORY_CARE_PROVIDER_SITE_OTHER): Payer: Medicare Other | Admitting: Vascular Surgery

## 2016-11-23 ENCOUNTER — Encounter (INDEPENDENT_AMBULATORY_CARE_PROVIDER_SITE_OTHER): Payer: Self-pay | Admitting: Vascular Surgery

## 2016-11-23 VITALS — BP 111/73 | HR 78 | Resp 15 | Ht 71.0 in | Wt 213.0 lb

## 2016-11-23 DIAGNOSIS — I714 Abdominal aortic aneurysm, without rupture, unspecified: Secondary | ICD-10-CM

## 2016-11-23 DIAGNOSIS — I739 Peripheral vascular disease, unspecified: Secondary | ICD-10-CM | POA: Insufficient documentation

## 2016-11-23 DIAGNOSIS — I10 Essential (primary) hypertension: Secondary | ICD-10-CM

## 2016-11-23 DIAGNOSIS — I70219 Atherosclerosis of native arteries of extremities with intermittent claudication, unspecified extremity: Secondary | ICD-10-CM

## 2016-11-23 DIAGNOSIS — F17200 Nicotine dependence, unspecified, uncomplicated: Secondary | ICD-10-CM | POA: Insufficient documentation

## 2016-11-23 DIAGNOSIS — F17210 Nicotine dependence, cigarettes, uncomplicated: Secondary | ICD-10-CM

## 2016-11-23 DIAGNOSIS — E785 Hyperlipidemia, unspecified: Secondary | ICD-10-CM | POA: Diagnosis not present

## 2016-11-23 NOTE — Assessment & Plan Note (Signed)

## 2016-11-23 NOTE — Progress Notes (Signed)
MRN : [id number]  Omar [name] is a 76 y.o. (03-24-1941) male who presents with chief complaint of  Chief Complaint  Patient presents with  . Re-evaluation    1 year follow up u/s  .  History of Present Illness: Patient returns today in follow up of AAA and PAD. He is about 3 years status post endovascular aneurysm repair. He is doing reasonably well. He continues to smoke. He denies any aneurysm related symptoms. Specifically, the patient denies new back or abdominal pain, or signs of peripheral embolization His noninvasive studies today show stable to slight decrease in size of his abdominal aortic aneurysm measuring about 4.4 cm with a patent stent graft without an endoleak present. His ABIs are preserved at 0.9 bilaterally.  Current Outpatient Prescriptions  Medication Sig Dispense Refill  . ALPRAZolam (XANAX) 0.25 MG tablet Take 0.25 mg by mouth 2 (two) times daily.    Marland Kitchen aspirin 81 MG chewable tablet Chew 81 mg by mouth daily.    Marland Kitchen atorvastatin (LIPITOR) 80 MG tablet Take 80 mg by mouth at bedtime.     . busPIRone (BUSPAR) 5 MG tablet Take 5 mg by mouth 2 (two) times daily.     . Butalbital-APAP-Caffeine 50-300-40 MG CAPS Take 1 capsule by mouth every 6 (six) hours as needed (for migraines).     . cetirizine (ZYRTEC) 10 MG tablet Take 10 mg by mouth daily.    Marland Kitchen CHERATUSSIN AC 100-10 MG/5ML syrup 4 (four) times daily as needed.     . cyclobenzaprine (FLEXERIL) 5 MG tablet Take 5 mg by mouth 3 (three) times daily as needed for muscle spasms.    . diphenoxylate-atropine (LOMOTIL) 2.5-0.025 MG tablet Take 1 tablet by mouth 4 (four) times daily as needed for diarrhea or loose stools.    . fexofenadine (ALLEGRA) 180 MG tablet Take 180 mg by mouth daily.    . fluticasone (FLONASE) 50 MCG/ACT nasal spray Place 2 sprays into both nostrils daily as needed for rhinitis.     . Fluticasone Furoate-Vilanterol (BREO ELLIPTA) 100-25 MCG/INH AEPB Inhale 1 puff into the lungs daily.     . folic  acid (FOLVITE) 1 MG tablet Take 1 mg by mouth daily.    . furosemide (LASIX) 40 MG tablet Take 1 tablet (40 mg total) by mouth daily as needed for fluid or edema. 30 tablet 3  . gabapentin (NEURONTIN) 100 MG capsule Take 100 mg by mouth at bedtime.    . isosorbide mononitrate (IMDUR) 30 MG 24 hr tablet Take 30 mg by mouth daily.    . metFORMIN (GLUCOPHAGE) 500 MG tablet Take 500 mg by mouth 2 (two) times daily with a meal.    . methotrexate (RHEUMATREX) 15 MG tablet Take 15 mg by mouth once a week. Caution: Chemotherapy. Protect from light.    Marland Kitchen oxyCODONE (OXY IR/ROXICODONE) 5 MG immediate release tablet Take 1 tablet (5 mg total) by mouth every 4 (four) hours as needed for moderate pain. 12 tablet 0  . pantoprazole (PROTONIX) 40 MG tablet Take 1 tablet (40 mg total) by mouth 2 (two) times daily. 60 tablet 3  . polyethylene glycol (MIRALAX / GLYCOLAX) packet Take 17 g by mouth daily as needed for mild constipation, moderate constipation or severe constipation. 14 each 0  . predniSONE (DELTASONE) 10 MG tablet Take 1 tablet (10 mg total) by mouth daily with breakfast. Please take 6 pills in the morning on the days 1, 2 and 3, then taper by one pill  daily until finished, thank you 33 tablet 0  . tamsulosin (FLOMAX) 0.4 MG CAPS capsule Take 1 capsule (0.4 mg total) by mouth daily. 30 capsule 3  . triamcinolone cream (KENALOG) 0.1 % Apply 1 application topically 2 (two) times daily.     No current facility-administered medications for this visit.     Past Medical History:  Diagnosis Date  . Anxiety   . Arthritis    rheumatoid  . Atrial fibrillation (HCC)    hx of  . Colon polyp 07-07-15   TUBULAR ADENOMA WITH AT LEAST HIGH-GRADE / Dr Rayann Heman  . Diabetes mellitus without complication ([place])   . Dysrhythmia    bradycardia...Marland KitchenMarland Kitchen2 to 1 heart block  . GERD (gastroesophageal reflux disease)   . Heart murmur    patient unaware of history of murmur  . Hypercholesterolemia   . Hypertension   . Prostate  cancer ([place]) 01/01/13, 01/30/14   Gleason 3+4=7, volume 46.6 cc  . Rheumatoid arthritis ([place])   . S/P radiation therapy  04/03/2014 through 06/04/2014                                                      Prostate 7800 cGy in 40 sessions                          . Sleep apnea    uses cpap but it is not in the hospital with him    Past Surgical History:  Procedure Laterality Date  . ABDOMINAL AORTIC ANEURYSM REPAIR  11/2013  . COLON SURGERY  March 2017   Right hemicolectomy for tubulovillous adenoma with high-grade dysplasia.  . COLONOSCOPY WITH PROPOFOL N/A 07/07/2015   Procedure: COLONOSCOPY WITH PROPOFOL;  Surgeon: Josefine Class, MD;  Location: [facility] ENDOSCOPY;  Service: Endoscopy;  Laterality: N/A;  . COLONOSCOPY WITH PROPOFOL N/A 10/28/2016   Procedure: COLONOSCOPY WITH PROPOFOL;  Surgeon: [name], MD;  Location: [facility] ENDOSCOPY;  Service: Endoscopy;  Laterality: N/A;  . ESOPHAGOGASTRODUODENOSCOPY (EGD) WITH PROPOFOL N/A 10/28/2016   Procedure: ESOPHAGOGASTRODUODENOSCOPY (EGD) WITH PROPOFOL;  Surgeon: [name], MD;  Location: [facility] ENDOSCOPY;  Service: Endoscopy;  Laterality: N/A;  . LAPAROSCOPIC RIGHT COLECTOMY Right 08/08/2015   Procedure: LAPAROSCOPIC RIGHT COLECTOMY;  Surgeon: Robert Bellow, MD;  Location: [facility] ORS;  Service: General;  Laterality: Right;  . NASAL SINUS SURGERY    . PACEMAKER INSERTION Left 08/26/2016   Procedure: INSERTION PACEMAKER;  Surgeon: Isaias Cowman, MD;  Location: [facility] ORS;  Service: Cardiovascular;  Laterality: Left;  . PROSTATE BIOPSY  01/01/13, 01/30/14   Gleason 3+3=6, vol 46.6 cc  . TONSILLECTOMY    . uvula surgery     for sleep apnea    Social History Social History  Substance Use Topics  . Smoking status: Current Every Day Smoker    Packs/day: 0.50    Years: 50.00    Types: Cigarettes  . Smokeless tobacco: Never Used     Comment: DOWN TO 1/2 PPD  . Alcohol use 0.6 oz/week    1 Cans of beer per week     Comment: occasionally     Family History Family History  Problem Relation Age of Onset  . Heart attack Mother   . Cirrhosis Father   . Cancer Brother        pancreatic  .  Diabetes Brother   . Diabetes Daughter        medication induced for cancer treatments  . Cancer Daughter        breast, brain     No Known Allergies   REVIEW OF SYSTEMS (Negative unless checked)  Constitutional: '[]' Weight loss  '[]' Fever  '[]' Chills Cardiac: '[]' Chest pain   '[]' Chest pressure   '[]' Palpitations   '[]' Shortness of breath when laying flat   '[]' Shortness of breath at rest   '[x]' Shortness of breath with exertion. Vascular:  '[x]' Pain in legs with walking   '[]' Pain in legs at rest   '[]' Pain in legs when laying flat   '[]' Claudication   '[]' Pain in feet when walking  '[]' Pain in feet at rest  '[]' Pain in feet when laying flat   '[]' History of DVT   '[]' Phlebitis   '[x]' Swelling in legs   '[]' Varicose veins   '[]' Non-healing ulcers Pulmonary:   '[]' Uses home oxygen   '[]' Productive cough   '[]' Hemoptysis   '[]' Wheeze  '[]' COPD   '[]' Asthma Neurologic:  '[]' Dizziness  '[]' Blackouts   '[]' Seizures   '[]' History of stroke   '[]' History of TIA  '[]' Aphasia   '[]' Temporary blindness   '[]' Dysphagia   '[]' Weakness or numbness in arms   '[]' Weakness or numbness in legs Musculoskeletal:  '[]' Arthritis   '[]' Joint swelling   '[]' Joint pain   '[]' Low back pain Hematologic:  '[]' Easy bruising  '[]' Easy bleeding   '[]' Hypercoagulable state   '[]' Anemic   Gastrointestinal:  '[]' Blood in stool   '[]' Vomiting blood  '[]' Gastroesophageal reflux/heartburn   '[]' Abdominal pain Genitourinary:  '[]' Chronic kidney disease   '[]' Difficult urination  '[]' Frequent urination  '[]' Burning with urination   '[]' Hematuria Skin:  '[]' Rashes   '[]' Ulcers   '[]' Wounds Psychological:  '[]' History of anxiety   '[]'  History of major depression.  Physical Examination  BP 111/73 (BP Location: Right Arm)   Pulse 78   Resp 15   Ht '5\' 11"'  (1.803 m)   Wt 213 lb (96.6 kg)   BMI 29.71 kg/m  Gen:  WD/WN, NAD Head: [place]/AT, No temporalis wasting. Ear/Nose/Throat: Hearing  grossly intact, nares w/o erythema or drainage, trachea midline Eyes: Conjunctiva clear. Sclera non-icteric Neck: Supple.  No JVD.  Pulmonary:  Good air movement, no use of accessory muscles.  Cardiac: RRR, normal S1, S2 Vascular:  Vessel Right Left  Radial Palpable Palpable  Ulnar Palpable Palpable  Brachial Palpable Palpable  Carotid Palpable, without bruit Palpable, without bruit  Aorta Not palpable N/A  Femoral Palpable Palpable  Popliteal Palpable Palpable  PT 1+ Palpable Palpable  DP 1+ Palpable 1+ Palpable   Gastrointestinal: soft, non-tender/non-distended. No guarding/reflex.  Musculoskeletal: M/S 5/5 throughout.  No deformity or atrophy. Mild bilateral lower extremity edema. Neurologic: Sensation grossly intact in extremities.  Symmetrical.  Speech is fluent.  Psychiatric: Judgment intact, Mood & affect appropriate for pt's clinical situation. Dermatologic: No rashes or ulcers noted.  No cellulitis or open wounds. Lymph : No Cervical, Axillary, or Inguinal lymphadenopathy.      Labs Recent Results (from the past 2160 hour(s))  Glucose, capillary     Status: Abnormal   Collection Time: 08/25/16  4:49 PM  Result Value Ref Range   Glucose-Capillary 100 (H) 65 - 99 mg/dL   Comment 1 Notify RN   Glucose, capillary     Status: None   Collection Time: 08/25/16  8:30 PM  Result Value Ref Range   Glucose-Capillary 93 65 - 99 mg/dL  Glucose, capillary     Status: Abnormal   Collection Time: 08/26/16 12:09  AM  Result Value Ref Range   Glucose-Capillary 101 (H) 65 - 99 mg/dL  Basic metabolic panel     Status: Abnormal   Collection Time: 08/26/16  3:58 AM  Result Value Ref Range   Sodium 136 135 - 145 mmol/L   Potassium 4.4 3.5 - 5.1 mmol/L   Chloride 108 101 - 111 mmol/L   CO2 22 22 - 32 mmol/L   Glucose, Bld 104 (H) 65 - 99 mg/dL   BUN 23 (H) 6 - 20 mg/dL   Creatinine, Ser 1.48 (H) 0.61 - 1.24 mg/dL   Calcium 8.6 (L) 8.9 - 10.3 mg/dL   GFR calc non Af Amer 45 (L)  >60 mL/min   GFR calc Af Amer 52 (L) >60 mL/min    Comment: (NOTE) The eGFR has been calculated using the CKD EPI equation. This calculation has not been validated in all clinical situations. eGFR's persistently <60 mL/min signify possible Chronic Kidney Disease.    Anion gap 6 5 - 15  Glucose, capillary     Status: Abnormal   Collection Time: 08/26/16  4:13 AM  Result Value Ref Range   Glucose-Capillary 114 (H) 65 - 99 mg/dL  Glucose, capillary     Status: None   Collection Time: 08/26/16  7:55 AM  Result Value Ref Range   Glucose-Capillary 92 65 - 99 mg/dL   Comment 1 Notify RN   Glucose, capillary     Status: None   Collection Time: 08/26/16 11:30 AM  Result Value Ref Range   Glucose-Capillary 76 65 - 99 mg/dL  Glucose, capillary     Status: Abnormal   Collection Time: 08/26/16 12:22 PM  Result Value Ref Range   Glucose-Capillary 125 (H) 65 - 99 mg/dL  Glucose, capillary     Status: None   Collection Time: 08/26/16  3:43 PM  Result Value Ref Range   Glucose-Capillary 96 65 - 99 mg/dL  Glucose, capillary     Status: Abnormal   Collection Time: 08/26/16  9:14 PM  Result Value Ref Range   Glucose-Capillary 108 (H) 65 - 99 mg/dL  Glucose, capillary     Status: Abnormal   Collection Time: 08/27/16  1:12 AM  Result Value Ref Range   Glucose-Capillary 117 (H) 65 - 99 mg/dL  Basic metabolic panel     Status: Abnormal   Collection Time: 08/27/16  4:50 AM  Result Value Ref Range   Sodium 135 135 - 145 mmol/L   Potassium 4.6 3.5 - 5.1 mmol/L   Chloride 104 101 - 111 mmol/L   CO2 23 22 - 32 mmol/L   Glucose, Bld 105 (H) 65 - 99 mg/dL   BUN 24 (H) 6 - 20 mg/dL   Creatinine, Ser 1.55 (H) 0.61 - 1.24 mg/dL   Calcium 8.6 (L) 8.9 - 10.3 mg/dL   GFR calc non Af Amer 42 (L) >60 mL/min   GFR calc Af Amer 49 (L) >60 mL/min    Comment: (NOTE) The eGFR has been calculated using the CKD EPI equation. This calculation has not been validated in all clinical situations. eGFR's  persistently <60 mL/min signify possible Chronic Kidney Disease.    Anion gap 8 5 - 15  Glucose, capillary     Status: Abnormal   Collection Time: 08/27/16  5:17 AM  Result Value Ref Range   Glucose-Capillary 104 (H) 65 - 99 mg/dL  Glucose, capillary     Status: Abnormal   Collection Time: 08/27/16  7:39 AM  Result  Value Ref Range   Glucose-Capillary 109 (H) 65 - 99 mg/dL  Glucose, capillary     Status: Abnormal   Collection Time: 08/27/16 11:38 AM  Result Value Ref Range   Glucose-Capillary 130 (H) 65 - 99 mg/dL   Comment 1 Document in Chart   Creatinine, serum     Status: Abnormal   Collection Time: 08/27/16  1:53 PM  Result Value Ref Range   Creatinine, Ser 1.62 (H) 0.61 - 1.24 mg/dL   GFR calc non Af Amer 40 (L) >60 mL/min   GFR calc Af Amer 47 (L) >60 mL/min    Comment: (NOTE) The eGFR has been calculated using the CKD EPI equation. This calculation has not been validated in all clinical situations. eGFR's persistently <60 mL/min signify possible Chronic Kidney Disease.   Uric acid     Status: Abnormal   Collection Time: 08/27/16  1:53 PM  Result Value Ref Range   Uric Acid, Serum 9.7 (H) 4.4 - 7.6 mg/dL  Glucose, capillary     Status: Abnormal   Collection Time: 10/28/16  7:19 AM  Result Value Ref Range   Glucose-Capillary 105 (H) 65 - 99 mg/dL  Surgical pathology     Status: None   Collection Time: 10/28/16  7:53 AM  Result Value Ref Range   SURGICAL PATHOLOGY      Surgical Pathology CASE: [id number] PATIENT: Kristin Bruins Surgical Pathology Report     SPECIMEN SUBMITTED: A. Duodenum bulb; cbx B. Stomach, random; cbx C. Esophagus, lower; cbx D. Colon polyps, transverse and descending; cold snare E. Colon polyps x2, sigmoid; cold snare  CLINICAL HISTORY: None provided  PRE-OPERATIVE DIAGNOSIS: Melena  POST-OPERATIVE DIAGNOSIS: Duodenitis; gastritis; esophagitis; esophageal, stricture; radiation proctitis; colon polyps;  diverticulosis     DIAGNOSIS: A. DUODENUM BULB; COLD BIOPSY: - DUODENAL MUCOSA WITH PROMINENT BRUNNER'S GLANDS AND REACTIVE OVERLYING EPITHELIAL CHANGES. - NEGATIVE FOR DYSPLASIA AND MALIGNANCY.  B. STOMACH; COLD BIOPSY: - UNREMARKABLE ANTRAL AND OXYNTIC MUCOSA.  C. ESOPHAGUS, LOWER; COLD BIOPSY: - SQUAMOUS MUCOSA WITH RARE INTRAEPITHELIAL EOSINOPHILS AND NEUTROPHILS, COMPATIBLE WITH REFLUX IN THE APPROPRIATE CLINICAL SETTING. - NEGATIVE FOR DYSPLASIA AND MALIGNANCY.  D. COLON POLYPS, T RANSVERSE AND DESCENDING; COLD SNARE: - TUBULAR ADENOMA. - NEGATIVE FOR HIGH GRADE DYSPLASIA AND MALIGNANCY.  E. COLON POLYPS 2, SIGMOID; COLD SNARE: - TUBULAR ADENOMA (2). - NEGATIVE FOR HIGH GRADE DYSPLASIA AND MALIGNANCY.    GROSS DESCRIPTION:  A. Labeled: C BX duodenum bulb  Tissue fragment(s): 2  Size: 0.3 cm  Description: tan fragments  Entirely submitted in 1 cassette(s).   B. Labeled: C BX random gastric  Tissue fragment(s): multiple  Size: aggregate, 0.9 x 0.5 x 0.1 cm  Description: tan fragments  Entirely submitted in 1 cassette(s).  C. Labeled: C BX lower esophagus  Tissue fragment(s): 2  Size: 0.2-0.3 cm  Description: pale tan fragments  Entirely submitted in 1 cassette(s).  D. Labeled: transverse colon and descending colon cold snare polyp  Tissue fragment(s): multiple  Size: aggregate, 0.3 x 0.2 x 0.1 cm  Description: tan fragments and fecal material (no additional tissue in container)  Entirely submitted in 1 cassette( s).  E. Labeled: sigmoid colon cold snare polyp 2  Tissue fragment(s): multiple  Size: aggregate, 2.0 x 1.0 x 0.1 cm  Description: green fecal material and tan tissue fragments  Entirely submitted in 1 cassette(s).    Final Diagnosis performed by Quay Burow, MD.  Electronically signed 10/29/2016 2:47:50PM    The electronic signature indicates that the named  Attending Pathologist has evaluated the  specimen  Technical component performed at [facility], [address] [place], [place] [address] Lab: [phone] Dir: Darrick Penna. Evette Doffing, MD  Professional component performed at [facility], [facility], [place], [place], [place] [address] Lab: [phone] Dir: Dellia Nims. Reuel Derby, MD      Radiology No results found.    Assessment/Plan  BP (high blood pressure) blood pressure control important in reducing the progression of atherosclerotic disease. On appropriate oral medications.   HLD (hyperlipidemia) lipid control important in reducing the progression of atherosclerotic disease. Continue statin therapy   PAD (peripheral artery disease) (HCC) ABIs stable and preserved. No intervention. Recheck in 1 year   Tobacco use disorder We had a discussion for approximately 3 minutes regarding the absolute need for smoking cessation due to the deleterious nature of tobacco on the vascular system. We discussed the tobacco use would diminish patency of any intervention, and likely significantly worsen progressio of disease. We discussed multiple agents for quitting including replacement therapy or medications to reduce cravings such as Chantix. The patient voices their understanding of the importance of smoking cessation.   AAA (abdominal aortic aneurysm) without rupture ([place]) 3 years status post endovascular repair and doing well. His noninvasive studies today show stable to slight decrease in size of his abdominal aortic aneurysm measuring about 4.4 cm with a patent stent graft without an endoleak present. His ABIs are preserved at 0.9 bilaterally. Recheck annually.    Leotis Pain, MD  11/23/2016 2:39 PM    This note was created with Dragon medical transcription system.  Any errors from dictation are purely unintentional

## 2016-11-23 NOTE — Assessment & Plan Note (Signed)
3 years status post endovascular repair and doing well. His noninvasive studies today show stable to slight decrease in size of his abdominal aortic aneurysm measuring about 4.4 cm with a patent stent graft without an endoleak present. His ABIs are preserved at 0.9 bilaterally. Recheck annually.

## 2016-11-23 NOTE — Assessment & Plan Note (Signed)
ABIs stable and preserved. No intervention. Recheck in 1 year

## 2016-11-23 NOTE — Assessment & Plan Note (Signed)
blood pressure control important in reducing the progression of atherosclerotic disease. On appropriate oral medications.  

## 2016-11-23 NOTE — Assessment & Plan Note (Signed)
lipid control important in reducing the progression of atherosclerotic disease. Continue statin therapy  

## 2016-12-13 ENCOUNTER — Encounter: Payer: Self-pay | Admitting: Podiatry

## 2016-12-13 ENCOUNTER — Ambulatory Visit (INDEPENDENT_AMBULATORY_CARE_PROVIDER_SITE_OTHER): Payer: Medicare Other | Admitting: Podiatry

## 2016-12-13 DIAGNOSIS — I739 Peripheral vascular disease, unspecified: Secondary | ICD-10-CM

## 2016-12-13 DIAGNOSIS — E1142 Type 2 diabetes mellitus with diabetic polyneuropathy: Secondary | ICD-10-CM

## 2016-12-13 DIAGNOSIS — B351 Tinea unguium: Secondary | ICD-10-CM | POA: Diagnosis not present

## 2016-12-13 DIAGNOSIS — M79609 Pain in unspecified limb: Secondary | ICD-10-CM

## 2016-12-13 NOTE — Progress Notes (Signed)
Complaint:  Visit Type: Patient returns to my office for continued preventative foot care services. Complaint: Patient states" my nails have grown long and thick and become painful to walk and wear shoes" Patient has been diagnosed with DM with pvd and neuropathy.. The patient presents for preventative foot care services. No changes to ROS  Podiatric Exam: Vascular: dorsalis pedis are palpable  B/L. and posterior tibial pulses are not  palpable bilateral. Capillary return is immediate. Temperature gradient is WNL. Skin turgor WNL  Sensorium: Diminished  Semmes Weinstein monofilament test.  Nail Exam: Pt has thick disfigured discolored nails with subungual debris noted bilateral entire nail hallux through fifth toenails Ulcer Exam: There is no evidence of ulcer or pre-ulcerative changes or infection. Orthopedic Exam: Muscle tone and strength are WNL. No limitations in general ROM. No crepitus or effusions noted. Foot type and digits show no abnormalities. Bony prominences are unremarkable. HAV  B/L and hammer toe second right. Skin: No Porokeratosis. No infection or ulcers  Diagnosis:  Onychomycosis, , Pain in right toe, pain in left toes  Treatment & Plan Procedures and Treatment: Consent by patient was obtained for treatment procedures. The patient understood the discussion of treatment and procedures well. All questions were answered thoroughly reviewed. Debridement of mycotic and hypertrophic toenails, 1 through 5 bilateral and clearing of subungual debris. No ulceration, no infection noted. Patient says he is not interested in diabetic shoes. Return Visit-Office Procedure: Patient instructed to return to the office for a follow up visit 3 months for continued evaluation and treatment.    Gardiner Barefoot DPM

## 2017-02-21 ENCOUNTER — Telehealth: Payer: Self-pay | Admitting: Oncology

## 2017-02-21 NOTE — Telephone Encounter (Signed)
New pt appt schd.  Unable to find Dx code for Abnormal UPEP. Left msg for Anderson Malta at Dr Elwyn Lade office to call with Dx code.

## 2017-03-08 ENCOUNTER — Inpatient Hospital Stay: Payer: Medicare Other | Attending: Oncology | Admitting: Oncology

## 2017-03-08 ENCOUNTER — Inpatient Hospital Stay: Payer: Medicare Other

## 2017-03-08 ENCOUNTER — Encounter: Payer: Self-pay | Admitting: Oncology

## 2017-03-08 VITALS — BP 128/71 | HR 65 | Temp 95.4°F | Ht 71.25 in | Wt 209.2 lb

## 2017-03-08 DIAGNOSIS — D509 Iron deficiency anemia, unspecified: Secondary | ICD-10-CM | POA: Diagnosis not present

## 2017-03-08 DIAGNOSIS — Z8546 Personal history of malignant neoplasm of prostate: Secondary | ICD-10-CM | POA: Diagnosis not present

## 2017-03-08 DIAGNOSIS — D631 Anemia in chronic kidney disease: Secondary | ICD-10-CM | POA: Diagnosis not present

## 2017-03-08 DIAGNOSIS — M069 Rheumatoid arthritis, unspecified: Secondary | ICD-10-CM | POA: Insufficient documentation

## 2017-03-08 DIAGNOSIS — K2980 Duodenitis without bleeding: Secondary | ICD-10-CM | POA: Diagnosis not present

## 2017-03-08 DIAGNOSIS — Z7982 Long term (current) use of aspirin: Secondary | ICD-10-CM | POA: Diagnosis not present

## 2017-03-08 DIAGNOSIS — F419 Anxiety disorder, unspecified: Secondary | ICD-10-CM | POA: Insufficient documentation

## 2017-03-08 DIAGNOSIS — I4891 Unspecified atrial fibrillation: Secondary | ICD-10-CM | POA: Insufficient documentation

## 2017-03-08 DIAGNOSIS — Z79899 Other long term (current) drug therapy: Secondary | ICD-10-CM | POA: Diagnosis not present

## 2017-03-08 DIAGNOSIS — D123 Benign neoplasm of transverse colon: Secondary | ICD-10-CM | POA: Diagnosis not present

## 2017-03-08 DIAGNOSIS — F17210 Nicotine dependence, cigarettes, uncomplicated: Secondary | ICD-10-CM | POA: Diagnosis not present

## 2017-03-08 DIAGNOSIS — Z7984 Long term (current) use of oral hypoglycemic drugs: Secondary | ICD-10-CM | POA: Insufficient documentation

## 2017-03-08 DIAGNOSIS — R809 Proteinuria, unspecified: Secondary | ICD-10-CM

## 2017-03-08 DIAGNOSIS — E1122 Type 2 diabetes mellitus with diabetic chronic kidney disease: Secondary | ICD-10-CM | POA: Diagnosis not present

## 2017-03-08 DIAGNOSIS — E7800 Pure hypercholesterolemia, unspecified: Secondary | ICD-10-CM | POA: Insufficient documentation

## 2017-03-08 DIAGNOSIS — C9000 Multiple myeloma not having achieved remission: Secondary | ICD-10-CM | POA: Diagnosis present

## 2017-03-08 DIAGNOSIS — D125 Benign neoplasm of sigmoid colon: Secondary | ICD-10-CM | POA: Diagnosis not present

## 2017-03-08 DIAGNOSIS — D649 Anemia, unspecified: Secondary | ICD-10-CM

## 2017-03-08 DIAGNOSIS — I129 Hypertensive chronic kidney disease with stage 1 through stage 4 chronic kidney disease, or unspecified chronic kidney disease: Secondary | ICD-10-CM | POA: Diagnosis not present

## 2017-03-08 DIAGNOSIS — N189 Chronic kidney disease, unspecified: Secondary | ICD-10-CM

## 2017-03-08 DIAGNOSIS — N182 Chronic kidney disease, stage 2 (mild): Secondary | ICD-10-CM | POA: Insufficient documentation

## 2017-03-08 DIAGNOSIS — Z950 Presence of cardiac pacemaker: Secondary | ICD-10-CM | POA: Insufficient documentation

## 2017-03-08 LAB — CBC WITH DIFFERENTIAL/PLATELET
BASOS ABS: 0.1 10*3/uL (ref 0–0.1)
BASOS PCT: 1 %
EOS ABS: 0.3 10*3/uL (ref 0–0.7)
Eosinophils Relative: 3 %
HCT: 27.7 % — ABNORMAL LOW (ref 40.0–52.0)
Hemoglobin: 8.7 g/dL — ABNORMAL LOW (ref 13.0–18.0)
Lymphocytes Relative: 15 %
Lymphs Abs: 1.4 10*3/uL (ref 1.0–3.6)
MCH: 22.4 pg — ABNORMAL LOW (ref 26.0–34.0)
MCHC: 31.5 g/dL — AB (ref 32.0–36.0)
MCV: 71 fL — ABNORMAL LOW (ref 80.0–100.0)
MONO ABS: 0.5 10*3/uL (ref 0.2–1.0)
MONOS PCT: 6 %
NEUTROS ABS: 7 10*3/uL — AB (ref 1.4–6.5)
Neutrophils Relative %: 75 %
PLATELETS: 348 10*3/uL (ref 150–440)
RBC: 3.9 MIL/uL — ABNORMAL LOW (ref 4.40–5.90)
RDW: 23 % — AB (ref 11.5–14.5)
WBC: 9.2 10*3/uL (ref 3.8–10.6)

## 2017-03-08 LAB — COMPREHENSIVE METABOLIC PANEL
ALBUMIN: 3.8 g/dL (ref 3.5–5.0)
ALT: 16 U/L — ABNORMAL LOW (ref 17–63)
ANION GAP: 10 (ref 5–15)
AST: 20 U/L (ref 15–41)
Alkaline Phosphatase: 66 U/L (ref 38–126)
BILIRUBIN TOTAL: 0.5 mg/dL (ref 0.3–1.2)
BUN: 14 mg/dL (ref 6–20)
CHLORIDE: 106 mmol/L (ref 101–111)
CO2: 20 mmol/L — AB (ref 22–32)
Calcium: 9 mg/dL (ref 8.9–10.3)
Creatinine, Ser: 1.19 mg/dL (ref 0.61–1.24)
GFR calc Af Amer: >60 mL/min (ref >60)
GFR calc non Af Amer: 58 mL/min — ABNORMAL LOW (ref >60)
GLUCOSE: 108 mg/dL — AB (ref 65–99)
POTASSIUM: 3.7 mmol/L (ref 3.5–5.1)
SODIUM: 136 mmol/L (ref 135–145)
TOTAL PROTEIN: 8 g/dL (ref 6.5–8.1)

## 2017-03-08 LAB — IRON AND TIBC
Iron: 8 ug/dL — ABNORMAL LOW (ref 45–182)
SATURATION RATIOS: 2 % — AB (ref 17.9–39.5)
TIBC: 441 ug/dL (ref 250–450)
UIBC: 433 ug/dL

## 2017-03-08 LAB — FERRITIN: FERRITIN: 5 ng/mL — AB (ref 24–336)

## 2017-03-08 LAB — VITAMIN B12: VITAMIN B 12: 210 pg/mL (ref 180–914)

## 2017-03-08 LAB — FOLATE: Folate: 46 ng/mL (ref >5.9)

## 2017-03-08 NOTE — Progress Notes (Signed)
Hematology/Oncology Consult note [facility] [phone] [phone]  CONSULT NOTE Patient Care Team: Jodi Marble, MD as PCP - General (Internal Medicine) Bary Castilla, Forest Gleason, MD (General Surgery) Edrick Kins, MD as Rounding Team (Internal Medicine) Hillary Bow, MD as Consulting Physician (Internal Medicine) [name], MD as Surgeon (Gastroenterology) Isaias Cowman, MD as Consulting Physician (Cardiology)  REFERRING PROVIDER: Dr.Lateef, [place] CHIEF COMPLAINTS/PURPOSE OF CONSULTATION:  Abnormal UPEP results.  HISTORY OF PRESENTING ILLNESS:  '@NAMEis'  a @ 76 y.o.  male with PMH listed below who was referred by xxx to me for evaluation of abnormal urine protein electrophoresis results. Extensive medical records review of [place], [place] and [place] was performed and HemOnc related medical problems are listed below.    1 Chronic Kidney disease, Stage II: patient follows up with Dr.Lateef.  2 Proteinuria:  02/18/2017: Albumin/Creatinine ratio was 7, Urine protein electrophoresis,Random Urine revealed M Spike 43.2%, total protein of 43.28m/dl,  01/05/2017 Albumin/Creatinine ratio was 455.2, Albumin 996.5, , 2.15.2018 Albumin/Creatinine ratio was 80.3, Albumin 73.3, Urine protein electrophoresis,Random Urine revealed M Spike 29.7%, total protein of 29.127mdl, 06/30/2016 Serum protein electrophoresis did not detect M spike.  Autoimmune disorder work up showed Negative anti dsDNA, RNP antibodies, smith antibody, Sjogren antibodies, anti Jo-1, positive antichromotin antibodies, positive ANA,  3 Anemia of chronic kidney disease:  4 Iron deficiency anemia: history of iron deficiency, ferritin was 7 on 08/11/2016. He had EGD and colonoscopy that were done this year which showed duodenitis/esphagitis/diverticulosis/benigh polyps. 5 Rheumatoid arthritis: he follows up with Dr.Kernodle and  is on chronic MTX. He reports joint pains are controlled, not quite symptomatic.   Patient denies any persistent bone pain or any pain. His main concern is that he feels weak, lack of energy, persistent, not improved with resting or taking naps. He also has lost 25 pounds in the past year which was unintentional.  He lives with a friend. He has 3 adult children.   ROS:  Review of Systems  Constitutional: Positive for fatigue and unexpected weight change.  HENT:  Negative.   Eyes: Negative.   Respiratory: Negative.   Cardiovascular: Negative.   Gastrointestinal: Negative.   Endocrine: Negative.   Genitourinary: Negative.    Musculoskeletal: Negative.   Skin: Negative.   Neurological: Negative.   Hematological: Negative.   Psychiatric/Behavioral: Negative.     MEDICAL HISTORY:  Past Medical History:  Diagnosis Date  . Anxiety   . Arthritis    rheumatoid  . Atrial fibrillation (HCC)    hx of  . Colon polyp 07-07-15   TUBULAR ADENOMA WITH AT LEAST HIGH-GRADE / Dr ReRayann Heman. Diabetes mellitus without complication ([facility]  . Dysrhythmia    bradycardia.....Marland KitchenMarland Kitchento 1 heart block  . GERD (gastroesophageal reflux disease)   . Heart murmur    patient unaware of history of murmur  . Hypercholesterolemia   . Hypertension   . [facility]   Gleason 3+4=7, volume 46.6 cc  . [place]  . S/P radiation therapy  04/03/2014 through 06/04/2014                                                      Prostate 7800 cGy in 40 sessions                          .  Sleep apnea    uses cpap but it is not in the hospital with him    SURGICAL HISTORY: Past Surgical History:  Procedure Laterality Date  . ABDOMINAL AORTIC ANEURYSM REPAIR  11/2013  . COLON SURGERY  March 2017   Right hemicolectomy for tubulovillous adenoma with high-grade dysplasia.  . COLONOSCOPY WITH PROPOFOL N/A 07/07/2015   Procedure: COLONOSCOPY WITH PROPOFOL;  Surgeon: Josefine Class,  MD;  Location: [facility] ENDOSCOPY;  Service: Endoscopy;  Laterality: N/A;  . COLONOSCOPY WITH PROPOFOL N/A 10/28/2016   Procedure: COLONOSCOPY WITH PROPOFOL;  Surgeon: [name], MD;  Location: [facility] ENDOSCOPY;  Service: Endoscopy;  Laterality: N/A;  . ESOPHAGOGASTRODUODENOSCOPY (EGD) WITH PROPOFOL N/A 10/28/2016   Procedure: ESOPHAGOGASTRODUODENOSCOPY (EGD) WITH PROPOFOL;  Surgeon: [name], MD;  Location: [facility] ENDOSCOPY;  Service: Endoscopy;  Laterality: N/A;  . LAPAROSCOPIC RIGHT COLECTOMY Right 08/08/2015   Procedure: LAPAROSCOPIC RIGHT COLECTOMY;  Surgeon: Robert Bellow, MD;  Location: [facility] ORS;  Service: General;  Laterality: Right;  . NASAL SINUS SURGERY    . PACEMAKER INSERTION Left 08/26/2016   Procedure: INSERTION PACEMAKER;  Surgeon: Isaias Cowman, MD;  Location: [facility] ORS;  Service: Cardiovascular;  Laterality: Left;  . PROSTATE BIOPSY  01/01/13, 01/30/14   Gleason 3+3=6, vol 46.6 cc  . TONSILLECTOMY    . uvula surgery     for sleep apnea    SOCIAL HISTORY: Social History   Social History  . Marital status: Divorced    Spouse name: N/A  . Number of children: N/A  . Years of education: N/A   Occupational History  . Not on file.   Social History Main Topics  . Smoking status: Current Every Day Smoker    Packs/day: 0.50    Years: 50.00    Types: Cigarettes  . Smokeless tobacco: Never Used     Comment: DOWN TO 1/2 PPD  . Alcohol use 0.6 oz/week    1 Cans of beer per week     Comment: occasionally  . Drug use: No  . Sexual activity: Not on file   Other Topics Concern  . Not on file   Social History Narrative  . No narrative on file    FAMILY HISTORY: Family History  Problem Relation Age of Onset  . Heart attack Mother   . Cirrhosis Father   . Cancer Brother        pancreatic  . Diabetes Brother   . Diabetes Daughter        medication induced for cancer treatments  . Cancer Daughter        breast, brain    ALLERGIES:  has No Known  Allergies.  MEDICATIONS:  Current Outpatient Prescriptions  Medication Sig Dispense Refill  . ALPRAZolam (XANAX) 0.25 MG tablet Take 0.25 mg by mouth 2 (two) times daily.    Marland Kitchen aspirin 81 MG chewable tablet Chew 81 mg by mouth daily.    Marland Kitchen atorvastatin (LIPITOR) 80 MG tablet Take 80 mg by mouth at bedtime.     . busPIRone (BUSPAR) 5 MG tablet Take 5 mg by mouth 2 (two) times daily.     . Butalbital-APAP-Caffeine 50-300-40 MG CAPS Take 1 capsule by mouth every 6 (six) hours as needed (for migraines).     . cetirizine (ZYRTEC) 10 MG tablet Take 10 mg by mouth daily.    Marland Kitchen CHERATUSSIN AC 100-10 MG/5ML syrup 4 (four) times daily as needed.     . cyclobenzaprine (FLEXERIL) 5 MG tablet Take 5 mg by mouth 3 (  three) times daily as needed for muscle spasms.    . diphenoxylate-atropine (LOMOTIL) 2.5-0.025 MG tablet Take 1 tablet by mouth 4 (four) times daily as needed for diarrhea or loose stools.    . fexofenadine (ALLEGRA) 180 MG tablet Take 180 mg by mouth daily.    . fluticasone (FLONASE) 50 MCG/ACT nasal spray Place 2 sprays into both nostrils daily as needed for rhinitis.     . Fluticasone Furoate-Vilanterol (BREO ELLIPTA) 100-25 MCG/INH AEPB Inhale 1 puff into the lungs daily.     . folic acid (FOLVITE) 1 MG tablet Take 1 mg by mouth daily.    . furosemide (LASIX) 40 MG tablet Take 1 tablet (40 mg total) by mouth daily as needed for fluid or edema. 30 tablet 3  . gabapentin (NEURONTIN) 100 MG capsule Take 100 mg by mouth at bedtime.    . isosorbide mononitrate (IMDUR) 30 MG 24 hr tablet Take 30 mg by mouth daily.    . metFORMIN (GLUCOPHAGE) 500 MG tablet Take 500 mg by mouth 2 (two) times daily with a meal.    . methotrexate (RHEUMATREX) 15 MG tablet Take 15 mg by mouth once a week. Caution: Chemotherapy. Protect from light.    Marland Kitchen oxyCODONE (OXY IR/ROXICODONE) 5 MG immediate release tablet Take 1 tablet (5 mg total) by mouth every 4 (four) hours as needed for moderate pain. 12 tablet 0  .  pantoprazole (PROTONIX) 40 MG tablet Take 1 tablet (40 mg total) by mouth 2 (two) times daily. 60 tablet 3  . polyethylene glycol (MIRALAX / GLYCOLAX) packet Take 17 g by mouth daily as needed for mild constipation, moderate constipation or severe constipation. 14 each 0  . predniSONE (DELTASONE) 10 MG tablet Take 1 tablet (10 mg total) by mouth daily with breakfast. Please take 6 pills in the morning on the days 1, 2 and 3, then taper by one pill daily until finished, thank you 33 tablet 0  . tamsulosin (FLOMAX) 0.4 MG CAPS capsule Take 1 capsule (0.4 mg total) by mouth daily. 30 capsule 3  . triamcinolone cream (KENALOG) 0.1 % Apply 1 application topically 2 (two) times daily.     No current facility-administered medications for this visit.       Marland Kitchen  PHYSICAL EXAMINATION: ECOG PERFORMANCE STATUS: 1 - Symptomatic but completely ambulatory Vitals:   03/08/17 0922  BP: 128/71  Pulse: 65  Temp: (!) 95.4 F (35.2 C)   Filed Weights   03/08/17 0922  Weight: 209 lb 4 oz (94.9 kg)    GENERAL:Alert, no distress and comfortable.  EYES: +  Pallor, no icterus OROPHARYNX: no thrush or ulceration;  NECK: supple, no masses felt LYMPH:  no palpable lymphadenopathy in the cervical, axillary or inguinal regions LUNGS: clear to auscultation and  No wheeze or crackles HEART/CVS: regular rate & rhythm and no murmurs; No lower extremity edema ABDOMEN: abdomen soft, non-tender and normal bowel sounds Musculoskeletal:no cyanosis of digits and no clubbing  PSYCH: alert & oriented x 3  NEURO: no focal motor/sensory deficits SKIN:  no rashes or significant lesions  LABORATORY DATA:  I have reviewed the data as listed Lab Results  Component Value Date   WBC 10.1 08/23/2016   HGB 9.9 (L) 08/24/2016   HCT 28.1 (L) 08/23/2016   MCV 75.9 (L) 08/23/2016   PLT 206 08/23/2016    Recent Labs  08/24/16 1333  08/26/16 0358 08/27/16 0450 08/27/16 1353  NA 137  --  136 135  --  K 3.8  --  4.4 4.6   --   CL 108  --  108 104  --   CO2 22  --  22 23  --   GLUCOSE 105*  --  104* 105*  --   BUN 22*  --  23* 24*  --   CREATININE 1.58*  < > 1.48* 1.55* 1.62*  CALCIUM 8.8*  --  8.6* 8.6*  --   GFRNONAA 41*  < > 45* 42* 40*  GFRAA 48*  < > 52* 49* 47*  < > = values in this interval not displayed.  RADIOGRAPHIC STUDIES: I have personally reviewed the radiological images as listed and agreed with the findings in the report. EGD  10/28/2016  - Duodenitis. Biopsied. - Gastritis. Biopsied. - LA Grade A reflux esophagitis. Biopsied. - Benign-appearing esophageal stenosis Colonoscopy 10/28/2016. - Multiple non-bleeding colonic angioectasias. - Diverticulosis in the entire examined colon. - Three 3 to 6 mm polyps in the sigmoid colon, in the descending colon and in the transverse colon, removed with a cold snare. Resected and retrieved. - The examination was otherwise normal on direct and retroflexion views   Pathology 10/28/2016 SPECIMEN SUBMITTED:  A. Duodenum bulb; cbx  B. Stomach, random; cbx  C. Esophagus, lower; cbx  D. Colon polyps, transverse and descending; cold snare  E. Colon polyps x2, sigmoid; cold snare  DIAGNOSIS:  A. DUODENUM BULB; COLD BIOPSY:  - DUODENAL MUCOSA WITH PROMINENT BRUNNER'S GLANDS AND REACTIVE OVERLYING  EPITHELIAL CHANGES.  - NEGATIVE FOR DYSPLASIA AND MALIGNANCY.   B. STOMACH; COLD BIOPSY:  - UNREMARKABLE ANTRAL AND OXYNTIC MUCOSA.   C. ESOPHAGUS, LOWER; COLD BIOPSY:  - SQUAMOUS MUCOSA WITH RARE INTRAEPITHELIAL EOSINOPHILS AND NEUTROPHILS,  COMPATIBLE WITH REFLUX IN THE APPROPRIATE CLINICAL SETTING.  - NEGATIVE FOR DYSPLASIA AND MALIGNANCY.   D. COLON POLYPS, TRANSVERSE AND DESCENDING; COLD SNARE:  - TUBULAR ADENOMA.  - NEGATIVE FOR HIGH GRADE DYSPLASIA AND MALIGNANCY.   E. COLON POLYPS 2, SIGMOID; COLD SNARE:  - TUBULAR ADENOMA (2).  - NEGATIVE FOR HIGH GRADE DYSPLASIA AND MALIGNANCY.     ASSESSMENT & PLAN:  1. Proteinuria, unspecified type    2. Chronic kidney disease, unspecified CKD stage   3. Anemia, unspecified type   4. Microcytic anemia   5. Fatigue associated with anemia    1,2 concerning for monoclonal gammopathy.  Will repeat SPEP with immunofixation and UPEP with immunofixation. Also check serum free light chain. Need of bone marrow biopsy was discussed with patient and will decide after these basic work up is done.   3/4  Anemia: could be from chronic kidney disease, or monoclonal gammopathy. other possible causes including chronic blood loss,  nutritional deficiency, hemolysis,  should be checked. Will check CBC w differential, CMP, vitamin B12, Folate, iron/TIBC, ferritin, reticulocytes, fecal occult, blood smear,  monoclonal gammopathy evaluation.  5 Fatigue is likely due to anemia. Pending work up  All questions were answered. The patient knows to call the clinic with any problems questions or concerns.  Return of visit: 1 week Thank you for this kind referral and the opportunity to participate in the care of this patient. A copy of today's note is routed to referring provider    Earlie Server, MD, PhD Hematology Oncology [facility] Pager- [phone] 03/08/2017

## 2017-03-08 NOTE — Progress Notes (Signed)
Patient here today as a new patient  

## 2017-03-09 LAB — PROTEIN ELECTROPHORESIS, SERUM
A/G Ratio: 0.9 (ref 0.7–1.7)
Albumin ELP: 3.3 g/dL (ref 2.9–4.4)
Alpha-1-Globulin: 0.3 g/dL (ref 0.0–0.4)
Alpha-2-Globulin: 0.9 g/dL (ref 0.4–1.0)
Beta Globulin: 1.1 g/dL (ref 0.7–1.3)
GAMMA GLOBULIN: 1.4 g/dL (ref 0.4–1.8)
GLOBULIN, TOTAL: 3.7 g/dL (ref 2.2–3.9)
TOTAL PROTEIN ELP: 7 g/dL (ref 6.0–8.5)

## 2017-03-09 LAB — IMMUNOGLOBULINS A/E/G/M, SERUM
IGA: 169 mg/dL (ref 61–437)
IGG (IMMUNOGLOBIN G), SERUM: 1397 mg/dL (ref 700–1600)
IgE (Immunoglobulin E), Serum: 14 IU/mL (ref 0–100)
IgM (Immunoglobulin M), Srm: 84 mg/dL (ref 15–143)

## 2017-03-09 LAB — IMMUNOFIXATION, URINE

## 2017-03-09 LAB — KAPPA/LAMBDA LIGHT CHAINS
KAPPA FREE LGHT CHN: 290.8 mg/L — AB (ref 3.3–19.4)
KAPPA, LAMDA LIGHT CHAIN RATIO: 13.04 — AB (ref 0.26–1.65)
Lambda free light chains: 22.3 mg/L (ref 5.7–26.3)

## 2017-03-09 LAB — BETA 2 MICROGLOBULIN, SERUM: BETA 2 MICROGLOBULIN: 2.5 mg/L — AB (ref 0.6–2.4)

## 2017-03-10 DIAGNOSIS — C9000 Multiple myeloma not having achieved remission: Secondary | ICD-10-CM | POA: Diagnosis not present

## 2017-03-10 LAB — IMMUNOFIXATION ELECTROPHORESIS
IGA: 164 mg/dL (ref 61–437)
IGG (IMMUNOGLOBIN G), SERUM: 1440 mg/dL (ref 700–1600)
IgM (Immunoglobulin M), Srm: 79 mg/dL (ref 15–143)
Total Protein ELP: 7.1 g/dL (ref 6.0–8.5)

## 2017-03-11 ENCOUNTER — Other Ambulatory Visit: Payer: Self-pay

## 2017-03-11 DIAGNOSIS — R809 Proteinuria, unspecified: Secondary | ICD-10-CM

## 2017-03-11 DIAGNOSIS — N189 Chronic kidney disease, unspecified: Secondary | ICD-10-CM

## 2017-03-14 ENCOUNTER — Ambulatory Visit (INDEPENDENT_AMBULATORY_CARE_PROVIDER_SITE_OTHER): Payer: Medicare Other | Admitting: Podiatry

## 2017-03-14 ENCOUNTER — Other Ambulatory Visit: Payer: Self-pay | Admitting: Oncology

## 2017-03-14 ENCOUNTER — Encounter: Payer: Self-pay | Admitting: Podiatry

## 2017-03-14 DIAGNOSIS — E1142 Type 2 diabetes mellitus with diabetic polyneuropathy: Secondary | ICD-10-CM

## 2017-03-14 DIAGNOSIS — D509 Iron deficiency anemia, unspecified: Secondary | ICD-10-CM | POA: Insufficient documentation

## 2017-03-14 DIAGNOSIS — M79609 Pain in unspecified limb: Secondary | ICD-10-CM

## 2017-03-14 DIAGNOSIS — B351 Tinea unguium: Secondary | ICD-10-CM

## 2017-03-14 DIAGNOSIS — I739 Peripheral vascular disease, unspecified: Secondary | ICD-10-CM

## 2017-03-14 LAB — UIFE/LIGHT CHAINS/TP QN, 24-HR UR
% BETA, Urine: 79.1 %
ALBUMIN, U: 10.4 %
ALPHA 1 URINE: 0.9 %
ALPHA 2 UR: 4 %
FREE KAPPA/LAMBDA RATIO: 162.42 — AB (ref 2.04–10.37)
FREE LT CHN EXCR RATE: 2550 mg/L — AB (ref 1.35–24.19)
Free Lambda Lt Chains,Ur: 15.7 mg/L — ABNORMAL HIGH (ref 0.24–6.66)
GAMMA GLOBULIN URINE: 5.6 %
M-SPIKE %, Urine: 53.9 % — ABNORMAL HIGH
M-SPIKE, MG/24 HR: 198 mg/(24.h) — AB
Total Protein, Urine-Ur/day: 368 mg/24 hr — ABNORMAL HIGH (ref 30–150)
Total Protein, Urine: 38.7 mg/dL
Total Volume: 950

## 2017-03-14 NOTE — Progress Notes (Signed)
Complaint:  Visit Type: Patient returns to my office for continued preventative foot care services. Complaint: Patient states" my nails have grown long and thick and become painful to walk and wear shoes" Patient has been diagnosed with DM with pvd and neuropathy.. The patient presents for preventative foot care services. No changes to ROS  Podiatric Exam: Vascular: dorsalis pedis are palpable  B/L. and posterior tibial pulses are not  palpable bilateral. Capillary return is immediate. Temperature gradient is WNL. Skin turgor WNL  Sensorium: Diminished  Semmes Weinstein monofilament test.  Nail Exam: Pt has thick disfigured discolored nails with subungual debris noted bilateral entire nail hallux through fifth toenails Ulcer Exam: There is no evidence of ulcer or pre-ulcerative changes or infection. Orthopedic Exam: Muscle tone and strength are WNL. No limitations in general ROM. No crepitus or effusions noted. Foot type and digits show no abnormalities. Bony prominences are unremarkable. HAV  B/L and hammer toe  B/L.  Pes planus. Skin: No Porokeratosis. No infection or ulcers  Diagnosis:  Onychomycosis, , Pain in right toe, pain in left toes  Treatment & Plan Procedures and Treatment: Consent by patient was obtained for treatment procedures. The patient understood the discussion of treatment and procedures well. All questions were answered thoroughly reviewed. Debridement of mycotic and hypertrophic toenails, 1 through 5 bilateral and clearing of subungual debris. No ulceration, no infection noted.  Return Visit-Office Procedure: Patient instructed to return to the office for a follow up visit 3 months for continued evaluation and treatment.    Gardiner Barefoot DPM

## 2017-03-15 ENCOUNTER — Ambulatory Visit
Admission: RE | Admit: 2017-03-15 | Discharge: 2017-03-15 | Disposition: A | Discharge status: Home / Self Care | Payer: Medicare Other | Source: Ambulatory Visit | Attending: Oncology | Admitting: Oncology

## 2017-03-15 ENCOUNTER — Inpatient Hospital Stay (HOSPITAL_BASED_OUTPATIENT_CLINIC_OR_DEPARTMENT_OTHER): Payer: Medicare Other | Admitting: Oncology

## 2017-03-15 VITALS — BP 119/70 | HR 68 | Temp 96.0°F | Wt 207.1 lb

## 2017-03-15 DIAGNOSIS — D631 Anemia in chronic kidney disease: Secondary | ICD-10-CM

## 2017-03-15 DIAGNOSIS — I129 Hypertensive chronic kidney disease with stage 1 through stage 4 chronic kidney disease, or unspecified chronic kidney disease: Secondary | ICD-10-CM

## 2017-03-15 DIAGNOSIS — R809 Proteinuria, unspecified: Secondary | ICD-10-CM | POA: Insufficient documentation

## 2017-03-15 DIAGNOSIS — N189 Chronic kidney disease, unspecified: Secondary | ICD-10-CM | POA: Diagnosis present

## 2017-03-15 DIAGNOSIS — D509 Iron deficiency anemia, unspecified: Secondary | ICD-10-CM

## 2017-03-15 DIAGNOSIS — N182 Chronic kidney disease, stage 2 (mild): Secondary | ICD-10-CM | POA: Diagnosis not present

## 2017-03-15 DIAGNOSIS — Z8546 Personal history of malignant neoplasm of prostate: Secondary | ICD-10-CM | POA: Diagnosis not present

## 2017-03-15 DIAGNOSIS — D649 Anemia, unspecified: Secondary | ICD-10-CM | POA: Insufficient documentation

## 2017-03-15 DIAGNOSIS — M4312 Spondylolisthesis, cervical region: Secondary | ICD-10-CM | POA: Insufficient documentation

## 2017-03-15 DIAGNOSIS — Z950 Presence of cardiac pacemaker: Secondary | ICD-10-CM | POA: Diagnosis not present

## 2017-03-15 DIAGNOSIS — Z79899 Other long term (current) drug therapy: Secondary | ICD-10-CM | POA: Diagnosis not present

## 2017-03-15 DIAGNOSIS — F17210 Nicotine dependence, cigarettes, uncomplicated: Secondary | ICD-10-CM

## 2017-03-15 DIAGNOSIS — M47812 Spondylosis without myelopathy or radiculopathy, cervical region: Secondary | ICD-10-CM | POA: Diagnosis not present

## 2017-03-15 DIAGNOSIS — C9000 Multiple myeloma not having achieved remission: Secondary | ICD-10-CM | POA: Diagnosis not present

## 2017-03-15 NOTE — Progress Notes (Addendum)
Hematology/Oncology Follow Up Note [facility] [phone] [phone]  CONSULT NOTE Patient Care Team: Jodi Marble, MD as PCP - General (Internal Medicine) Bary Castilla, Forest Gleason, MD (General Surgery) Edrick Kins, MD as Rounding Team (Internal Medicine) Hillary Bow, MD as Consulting Physician (Internal Medicine) [name], MD as Surgeon (Gastroenterology) Isaias Cowman, MD as Consulting Physician (Cardiology)  REFERRING PROVIDER: Dr.Lateef, [place] CHIEF COMPLAINTS/REASON FOR VISIT  Abnormal UPEP results, follow up for evaluation of monoclonal gammopathy  HISTORY OF PRESENTING ILLNESS:  76 y.o.  male with PMH listed below who presents to follow up on the evaluation and management of his abnormal urine protein electrophoresis results. I reviewed the records from [facility], [place] and [place] was performed and HemOnc related medical problems are listed below.  He lives with a friend. He has 3 adult children.   1 Chronic Kidney disease, Stage II: patient follows up with Dr.Lateef.  2 Proteinuria:  02/18/2017: Albumin/Creatinine ratio was 7, Urine protein electrophoresis,Random Urine revealed M Spike 43.2%, total protein of 43.48m/dl,  01/05/2017 Albumin/Creatinine ratio was 455.2, Albumin 996.5, , 2.15.2018 Albumin/Creatinine ratio was 80.3, Albumin 73.3, Urine protein electrophoresis,Random Urine revealed M Spike 29.7%, total protein of 29.139mdl, 06/30/2016 Serum protein electrophoresis did not detect M spike.  Autoimmune disorder work up showed Negative anti dsDNA, RNP antibodies, smith antibody, Sjogren antibodies, anti Jo-1, positive antichromotin antibodies, positive ANA,  3 Anemia of chronic kidney disease:  4 Iron deficiency anemia: history of iron deficiency, ferritin was 7 on 08/11/2016. He had EGD and colonoscopy that were done this year which showed  duodenitis/esphagitis/diverticulosis/benigh polyps. 5 Rheumatoid arthritis: he follows up with Dr.Kernodle and is on chronic MTX. He reports joint pains are controlled, not quite symptomatic.   Patient denies any persistent bone pain or any pain. He continue to feel lack of energy, persistent, not improved with resting or taking naps. He also has lost 25 pounds in the past year which was unintentional.   Review of Systems - Oncology Constitutional: Negative for fever, night sweats, (+) unintentional weight loss, (+) Fatigue HENT: Negative for ear pain, hearing loss, nasal bleeding Eyes: Negative for eye pain, double vision   Respiratory: Negative for wheezing, shortness of breath, cough Cardiovascular: Negative for chest pain, palpitation.   Gastrointestinal: Negative abdominal pain, diarrhea, nausea vomiting Endocrine: Negative  Genitourinary: Negative for dysuria, hematuria, frequency Skin: Negative for rash, iching, bruising Neurological: Negative for headache, dizziness, seizure Hematological: Negative for easy bruising/bleeding, lymph node enlargement Psychiatric/Behavioral: Negative for depression, anxiety, suicidality MEDICAL HISTORY:  Past Medical History:  Diagnosis Date  . Anxiety   . Arthritis    rheumatoid  . Atrial fibrillation (HCC)    hx of  . Colon polyp 07-07-15   TUBULAR ADENOMA WITH AT LEAST HIGH-GRADE / Dr ReRayann Heman. [place] mellitus without complication ([place]  . Dysrhythmia    bradycardia.....Marland KitchenMarland Kitchento 1 heart block  . GERD (gastroesophageal reflux disease)   . Heart murmur    patient unaware of history of murmur  . Hypercholesterolemia   . Hypertension   . [place] [place]   Gleason 3+4=7, volume 46.6 cc  . [place] ([place]  . S/P radiation therapy  04/03/2014 through 06/04/2014  Prostate 7800 cGy in 40 sessions                          . Sleep apnea    uses cpap but it is not  in the hospital with him    SURGICAL HISTORY: Past Surgical History:  Procedure Laterality Date  . ABDOMINAL AORTIC ANEURYSM REPAIR  11/2013  . COLON SURGERY  March 2017   Right hemicolectomy for tubulovillous adenoma with high-grade dysplasia.  . COLONOSCOPY WITH PROPOFOL N/A 07/07/2015   Procedure: COLONOSCOPY WITH PROPOFOL;  Surgeon: Josefine Class, MD;  Location: [facility] ENDOSCOPY;  Service: Endoscopy;  Laterality: N/A;  . COLONOSCOPY WITH PROPOFOL N/A 10/28/2016   Procedure: COLONOSCOPY WITH PROPOFOL;  Surgeon: [name], MD;  Location: [facility] ENDOSCOPY;  Service: Endoscopy;  Laterality: N/A;  . ESOPHAGOGASTRODUODENOSCOPY (EGD) WITH PROPOFOL N/A 10/28/2016   Procedure: ESOPHAGOGASTRODUODENOSCOPY (EGD) WITH PROPOFOL;  Surgeon: [name], MD;  Location: [facility] ENDOSCOPY;  Service: Endoscopy;  Laterality: N/A;  . LAPAROSCOPIC RIGHT COLECTOMY Right 08/08/2015   Procedure: LAPAROSCOPIC RIGHT COLECTOMY;  Surgeon: Robert Bellow, MD;  Location: [facility] ORS;  Service: General;  Laterality: Right;  . NASAL SINUS SURGERY    . PACEMAKER INSERTION Left 08/26/2016   Procedure: INSERTION PACEMAKER;  Surgeon: Isaias Cowman, MD;  Location: [facility] ORS;  Service: Cardiovascular;  Laterality: Left;  . PROSTATE BIOPSY  01/01/13, 01/30/14   Gleason 3+3=6, vol 46.6 cc  . TONSILLECTOMY    . uvula surgery     for sleep apnea    SOCIAL HISTORY: Social History   Social History  . Marital status: Divorced    Spouse name: N/A  . Number of children: N/A  . Years of education: N/A   Occupational History  . Not on file.   Social History Main Topics  . Smoking status: Current Every Day Smoker    Packs/day: 0.50    Years: 50.00    Types: Cigarettes  . Smokeless tobacco: Never Used     Comment: DOWN TO 1/2 PPD  . Alcohol use 0.6 oz/week    1 Cans of beer per week     Comment: occasionally  . Drug use: No  . Sexual activity: Not on file   Other Topics Concern  . Not on file   Social History  Narrative  . No narrative on file    FAMILY HISTORY: Family History  Problem Relation Age of Onset  . Heart attack Mother   . Cirrhosis Father   . Pancreatic cancer Brother   . Diabetes Brother   . Diabetes Daughter        medication induced for cancer treatments  . Cancer Daughter        breast, brain    ALLERGIES:  has No Known Allergies.  MEDICATIONS:  Current Outpatient Prescriptions  Medication Sig Dispense Refill  . ALPRAZolam (XANAX) 0.25 MG tablet Take 0.25 mg by mouth 2 (two) times daily.    Marland Kitchen aspirin 81 MG chewable tablet Chew 81 mg by mouth daily.    Marland Kitchen atorvastatin (LIPITOR) 80 MG tablet Take 80 mg by mouth at bedtime.     . busPIRone (BUSPAR) 5 MG tablet Take 5 mg by mouth 2 (two) times daily.     . Butalbital-APAP-Caffeine 50-300-40 MG CAPS Take 1 capsule by mouth every 6 (six) hours as needed (for migraines).     . cetirizine (ZYRTEC) 10 MG tablet Take 10 mg by mouth daily.    . fexofenadine (  ALLEGRA) 180 MG tablet Take 180 mg by mouth daily.    . fluticasone (FLONASE) 50 MCG/ACT nasal spray Place 2 sprays into both nostrils daily as needed for rhinitis.     . Fluticasone Furoate-Vilanterol (BREO ELLIPTA) 100-25 MCG/INH AEPB Inhale 1 puff into the lungs daily.     . folic acid (FOLVITE) 1 MG tablet Take 1 mg by mouth daily.    . isosorbide mononitrate (IMDUR) 30 MG 24 hr tablet Take 30 mg by mouth daily.    . metFORMIN (GLUCOPHAGE) 500 MG tablet Take 500 mg by mouth 2 (two) times daily with a meal.    . methotrexate (RHEUMATREX) 15 MG tablet Take 15 mg by mouth once a week. Caution: Chemotherapy. Protect from light.    . pantoprazole (PROTONIX) 40 MG tablet Take 1 tablet (40 mg total) by mouth 2 (two) times daily. 60 tablet 3  . triamcinolone cream (KENALOG) 0.1 % Apply 1 application topically 2 (two) times daily.     No current facility-administered medications for this visit.       Marland Kitchen  PHYSICAL EXAMINATION: ECOG PERFORMANCE STATUS: 1 - Symptomatic but  completely ambulatory Vitals:   03/15/17 1156  BP: 119/70  Pulse: 68  Temp: (!) 96 F (35.6 C)   Filed Weights   03/15/17 1156  Weight: 207 lb 2 oz (94 kg)   GENERAL: No distress, well nourished.  SKIN:  No rashes or significant lesions  HEAD: Normocephalic, No masses, lesions, tenderness or abnormalities  EYES: Conjunctiva are pink, non icteric ENT: External ears normal ,lips , buccal mucosa, and tongue normal and mucous membranes are moist  LYMPH: No palpable cervical and axillary lymphadenopathy  LUNGS: Clear to auscultation, no crackles or wheezes HEART: Regular rate & rhythm, no murmurs, no gallops, S1 normal and S2 normal  ABDOMEN: Abdomen soft, non-tender, normal bowel sounds, I did not appreciate any  masses or organomegaly  MUSCULOSKELETAL: No CVA tenderness and no tenderness on percussion of the back or rib cage.  EXTREMITIES: No edema, no skin discoloration or tenderness NEURO: Alert & oriented, no focal motor/sensory deficits.  LABORATORY DATA:  I have reviewed the data as listed Lab Results  Component Value Date   WBC 9.2 03/08/2017   HGB 8.7 (L) 03/08/2017   HCT 27.7 (L) 03/08/2017   MCV 71.0 (L) 03/08/2017   PLT 348 03/08/2017    Recent Labs  08/26/16 0358 08/27/16 0450 08/27/16 1353 03/08/17 1055  NA 136 135  --  136  K 4.4 4.6  --  3.7  CL 108 104  --  106  CO2 22 23  --  20*  GLUCOSE 104* 105*  --  108*  BUN 23* 24*  --  14  CREATININE 1.48* 1.55* 1.62* 1.19  CALCIUM 8.6* 8.6*  --  9.0  GFRNONAA 45* 42* 40* 58*  GFRAA 52* 49* 47* >60  PROT  --   --   --  8.0  ALBUMIN  --   --   --  3.8  AST  --   --   --  20  ALT  --   --   --  16*  ALKPHOS  --   --   --  66  BILITOT  --   --   --  0.5    SPEP: no M spike Serum free light chain Kappa/Lamda ration 13.04 UPEP  Free light chain Kappa/lamda ratio 162.43, M spike 159m/24 hour  ASSESSMENT & PLAN:  1. Multiple myeloma not  having achieved remission ([place])   2. Iron deficiency anemia,  unspecified iron deficiency anemia type   3. CKD (chronic kidney disease) stage 2, GFR 60-89 ml/min    Kappa light chain myeloma Check skeletal survey and bone marrow biopsy Plan IV iron venofer.  Plan IV iron with Venofer 249m twice a week for 4 doses. Allergy reactions/infusion reaction including anaphylactic reaction discussed with patient. Patient voices understanding and willing to proceed.  All questions were answered. The patient knows to call the clinic with any problems questions or concerns.  Return of visit: 1 week after bone marrow biopsy. Thank you for this kind referral and the opportunity to participate in the care of this patient. A copy of today's note is routed to referring provider    ZEarlie Server MD, PhD Hematology Oncology [facility]Co[facility] Centerat AMain [facility] Center EastPager- [phone]/02/2017

## 2017-03-15 NOTE — Progress Notes (Signed)
Patient here today for follow up.  Patient states no new concerns today  

## 2017-03-16 ENCOUNTER — Encounter: Payer: Self-pay | Admitting: Oncology

## 2017-03-18 ENCOUNTER — Other Ambulatory Visit: Payer: Self-pay | Admitting: Physician Assistant

## 2017-03-21 ENCOUNTER — Other Ambulatory Visit (HOSPITAL_COMMUNITY)
Admission: RE | Admit: 2017-03-21 | Discharge: 2017-03-21 | Disposition: A | Discharge status: Home / Self Care | Payer: Medicare Other | Source: Ambulatory Visit | Attending: Oncology | Admitting: Oncology

## 2017-03-21 ENCOUNTER — Ambulatory Visit
Admission: RE | Admit: 2017-03-21 | Discharge: 2017-03-21 | Disposition: A | Discharge status: Home / Self Care | Payer: Medicare Other | Source: Ambulatory Visit | Attending: Oncology | Admitting: Oncology

## 2017-03-21 DIAGNOSIS — G4730 Sleep apnea, unspecified: Secondary | ICD-10-CM | POA: Diagnosis not present

## 2017-03-21 DIAGNOSIS — C9000 Multiple myeloma not having achieved remission: Secondary | ICD-10-CM | POA: Diagnosis present

## 2017-03-21 DIAGNOSIS — Z8249 Family history of ischemic heart disease and other diseases of the circulatory system: Secondary | ICD-10-CM | POA: Insufficient documentation

## 2017-03-21 DIAGNOSIS — F17210 Nicotine dependence, cigarettes, uncomplicated: Secondary | ICD-10-CM | POA: Diagnosis not present

## 2017-03-21 DIAGNOSIS — D509 Iron deficiency anemia, unspecified: Secondary | ICD-10-CM | POA: Insufficient documentation

## 2017-03-21 DIAGNOSIS — D72822 Plasmacytosis: Secondary | ICD-10-CM | POA: Diagnosis not present

## 2017-03-21 DIAGNOSIS — Z8546 Personal history of malignant neoplasm of prostate: Secondary | ICD-10-CM | POA: Insufficient documentation

## 2017-03-21 DIAGNOSIS — Z7984 Long term (current) use of oral hypoglycemic drugs: Secondary | ICD-10-CM | POA: Diagnosis not present

## 2017-03-21 DIAGNOSIS — Z833 Family history of diabetes mellitus: Secondary | ICD-10-CM | POA: Diagnosis not present

## 2017-03-21 DIAGNOSIS — I129 Hypertensive chronic kidney disease with stage 1 through stage 4 chronic kidney disease, or unspecified chronic kidney disease: Secondary | ICD-10-CM | POA: Insufficient documentation

## 2017-03-21 DIAGNOSIS — Z8509 Personal history of malignant neoplasm of other digestive organs: Secondary | ICD-10-CM | POA: Diagnosis not present

## 2017-03-21 DIAGNOSIS — Z7951 Long term (current) use of inhaled steroids: Secondary | ICD-10-CM | POA: Insufficient documentation

## 2017-03-21 DIAGNOSIS — K219 Gastro-esophageal reflux disease without esophagitis: Secondary | ICD-10-CM | POA: Diagnosis not present

## 2017-03-21 DIAGNOSIS — Z79899 Other long term (current) drug therapy: Secondary | ICD-10-CM | POA: Diagnosis not present

## 2017-03-21 DIAGNOSIS — N182 Chronic kidney disease, stage 2 (mild): Secondary | ICD-10-CM | POA: Insufficient documentation

## 2017-03-21 DIAGNOSIS — Z923 Personal history of irradiation: Secondary | ICD-10-CM | POA: Insufficient documentation

## 2017-03-21 DIAGNOSIS — E7800 Pure hypercholesterolemia, unspecified: Secondary | ICD-10-CM | POA: Insufficient documentation

## 2017-03-21 DIAGNOSIS — Z7982 Long term (current) use of aspirin: Secondary | ICD-10-CM | POA: Insufficient documentation

## 2017-03-21 DIAGNOSIS — M069 Rheumatoid arthritis, unspecified: Secondary | ICD-10-CM | POA: Diagnosis not present

## 2017-03-21 DIAGNOSIS — E1122 Type 2 diabetes mellitus with diabetic chronic kidney disease: Secondary | ICD-10-CM | POA: Diagnosis not present

## 2017-03-21 HISTORY — DX: Presence of cardiac pacemaker: Z95.0

## 2017-03-21 HISTORY — DX: Chronic kidney disease, unspecified: N18.9

## 2017-03-21 LAB — APTT: aPTT: 35 seconds (ref 24–36)

## 2017-03-21 LAB — CBC WITH DIFFERENTIAL/PLATELET
Basophils Absolute: 0 10*3/uL (ref 0–0.1)
Basophils Relative: 1 %
Eosinophils Absolute: 0.2 10*3/uL (ref 0–0.7)
Eosinophils Relative: 3 %
HCT: 26 % — ABNORMAL LOW (ref 40.0–52.0)
Hemoglobin: 8 g/dL — ABNORMAL LOW (ref 13.0–18.0)
Lymphocytes Relative: 12 %
Lymphs Abs: 0.9 10*3/uL — ABNORMAL LOW (ref 1.0–3.6)
MCH: 21.4 pg — ABNORMAL LOW (ref 26.0–34.0)
MCHC: 30.8 g/dL — ABNORMAL LOW (ref 32.0–36.0)
MCV: 69.5 fL — ABNORMAL LOW (ref 80.0–100.0)
Monocytes Absolute: 0.6 10*3/uL (ref 0.2–1.0)
Monocytes Relative: 7 %
Neutro Abs: 5.8 10*3/uL (ref 1.4–6.5)
Neutrophils Relative %: 77 %
Platelets: 272 10*3/uL (ref 150–440)
RBC: 3.74 MIL/uL — ABNORMAL LOW (ref 4.40–5.90)
RDW: 22.6 % — ABNORMAL HIGH (ref 11.5–14.5)
WBC: 7.5 10*3/uL (ref 3.8–10.6)

## 2017-03-21 LAB — PROTIME-INR
INR: 1.1
PROTHROMBIN TIME: 14.1 s (ref 11.4–15.2)

## 2017-03-21 MED ORDER — BUPIVACAINE HCL (PF) 0.25 % IJ SOLN
INTRAMUSCULAR | Status: AC | PRN
  Administered 2017-03-21: 6 mL

## 2017-03-21 MED ORDER — HEPARIN SOD (PORK) LOCK FLUSH 100 UNIT/ML IV SOLN
INTRAVENOUS | Status: AC
  Filled 2017-03-21: qty 5

## 2017-03-21 MED ORDER — BUPIVACAINE HCL (PF) 0.25 % IJ SOLN
INTRAMUSCULAR | Status: AC
  Filled 2017-03-21: qty 30

## 2017-03-21 MED ORDER — FENTANYL CITRATE (PF) 100 MCG/2ML IJ SOLN
INTRAMUSCULAR | Status: AC
  Filled 2017-03-21: qty 2

## 2017-03-21 MED ORDER — FENTANYL CITRATE (PF) 100 MCG/2ML IJ SOLN
INTRAMUSCULAR | Status: AC | PRN
  Administered 2017-03-21 (×3): 25 ug via INTRAVENOUS

## 2017-03-21 MED ORDER — MIDAZOLAM HCL 5 MG/5ML IJ SOLN
INTRAMUSCULAR | Status: AC | PRN
  Administered 2017-03-21: 1 mg via INTRAVENOUS

## 2017-03-21 MED ORDER — MIDAZOLAM HCL 5 MG/5ML IJ SOLN
INTRAMUSCULAR | Status: AC
  Filled 2017-03-21: qty 5

## 2017-03-21 MED ORDER — SODIUM CHLORIDE 0.9 % IV SOLN
INTRAVENOUS | Status: DC
  Administered 2017-03-21: 08:00:00 via INTRAVENOUS

## 2017-03-21 MED ORDER — HYDROCODONE-ACETAMINOPHEN 5-325 MG PO TABS
1.0000 | ORAL_TABLET | ORAL | Status: DC | PRN
  Filled 2017-03-21: qty 2

## 2017-03-21 NOTE — Procedures (Signed)
CT bone marrow biopsy without difficulty  Complications:  None  Blood Loss: none  See dictation in canopy pacs  

## 2017-03-22 ENCOUNTER — Inpatient Hospital Stay: Payer: Medicare Other

## 2017-03-22 VITALS — BP 115/73 | HR 64 | Temp 96.4°F | Resp 20

## 2017-03-22 DIAGNOSIS — C9000 Multiple myeloma not having achieved remission: Secondary | ICD-10-CM | POA: Diagnosis not present

## 2017-03-22 DIAGNOSIS — D509 Iron deficiency anemia, unspecified: Secondary | ICD-10-CM

## 2017-03-22 MED ORDER — SODIUM CHLORIDE 0.9 % IV SOLN
200.0000 mg | Freq: Once | INTRAVENOUS | Status: DC
  Filled 2017-03-22: qty 10

## 2017-03-22 MED ORDER — IRON SUCROSE 20 MG/ML IV SOLN
200.0000 mg | Freq: Once | INTRAVENOUS | Status: AC
  Administered 2017-03-22: 200 mg via INTRAVENOUS
  Filled 2017-03-22: qty 10

## 2017-03-22 MED ORDER — SODIUM CHLORIDE 0.9 % IV SOLN
Freq: Once | INTRAVENOUS | Status: AC
  Administered 2017-03-22: 14:00:00 via INTRAVENOUS
  Filled 2017-03-22: qty 1000

## 2017-03-25 ENCOUNTER — Other Ambulatory Visit: Payer: Self-pay | Admitting: Oncology

## 2017-03-25 ENCOUNTER — Inpatient Hospital Stay: Payer: Medicare Other

## 2017-03-25 VITALS — BP 94/60 | HR 60 | Temp 98.0°F | Resp 20

## 2017-03-25 DIAGNOSIS — C9000 Multiple myeloma not having achieved remission: Secondary | ICD-10-CM | POA: Diagnosis not present

## 2017-03-25 DIAGNOSIS — D472 Monoclonal gammopathy: Secondary | ICD-10-CM

## 2017-03-25 DIAGNOSIS — D509 Iron deficiency anemia, unspecified: Secondary | ICD-10-CM

## 2017-03-25 MED ORDER — IRON SUCROSE 20 MG/ML IV SOLN
200.0000 mg | Freq: Once | INTRAVENOUS | Status: AC
  Administered 2017-03-25: 200 mg via INTRAVENOUS
  Filled 2017-03-25: qty 10

## 2017-03-25 MED ORDER — SODIUM CHLORIDE 0.9 % IV SOLN
Freq: Once | INTRAVENOUS | Status: AC
  Administered 2017-03-25: 14:00:00 via INTRAVENOUS
  Filled 2017-03-25: qty 1000

## 2017-03-25 NOTE — Progress Notes (Signed)
Called patient and bone marrow results communicated with him over the phone.  Plasma cell 8%, with questionable right distal ulna lesion.  Will proceed with PET scan and repeat bone marrow biopsy. Patient voice understanding.  

## 2017-03-26 ENCOUNTER — Encounter: Payer: Self-pay | Admitting: Oncology

## 2017-03-28 ENCOUNTER — Inpatient Hospital Stay: Payer: Medicare Other

## 2017-03-28 DIAGNOSIS — C9000 Multiple myeloma not having achieved remission: Secondary | ICD-10-CM | POA: Diagnosis not present

## 2017-03-28 DIAGNOSIS — D509 Iron deficiency anemia, unspecified: Secondary | ICD-10-CM

## 2017-03-28 MED ORDER — SODIUM CHLORIDE 0.9 % IV SOLN
Freq: Once | INTRAVENOUS | Status: AC
  Administered 2017-03-28: 14:00:00 via INTRAVENOUS
  Filled 2017-03-28: qty 1000

## 2017-03-28 MED ORDER — IRON SUCROSE 20 MG/ML IV SOLN
200.0000 mg | Freq: Once | INTRAVENOUS | Status: AC
  Administered 2017-03-28: 200 mg via INTRAVENOUS
  Filled 2017-03-28: qty 10

## 2017-03-30 ENCOUNTER — Inpatient Hospital Stay: Payer: Medicare Other | Admitting: Oncology

## 2017-03-31 ENCOUNTER — Inpatient Hospital Stay: Payer: Medicare Other

## 2017-03-31 VITALS — BP 111/66 | HR 60 | Temp 96.1°F | Resp 18

## 2017-03-31 DIAGNOSIS — D509 Iron deficiency anemia, unspecified: Secondary | ICD-10-CM

## 2017-03-31 DIAGNOSIS — C9000 Multiple myeloma not having achieved remission: Secondary | ICD-10-CM | POA: Diagnosis not present

## 2017-03-31 MED ORDER — SODIUM CHLORIDE 0.9 % IV SOLN
Freq: Once | INTRAVENOUS | Status: AC
  Administered 2017-03-31: 13:00:00 via INTRAVENOUS
  Filled 2017-03-31: qty 1000

## 2017-03-31 MED ORDER — IRON SUCROSE 20 MG/ML IV SOLN
200.0000 mg | Freq: Once | INTRAVENOUS | Status: AC
  Administered 2017-03-31: 200 mg via INTRAVENOUS
  Filled 2017-03-31: qty 10

## 2017-04-05 ENCOUNTER — Other Ambulatory Visit: Payer: Self-pay | Admitting: Radiology

## 2017-04-06 ENCOUNTER — Ambulatory Visit
Admission: RE | Admit: 2017-04-06 | Discharge: 2017-04-06 | Disposition: A | Discharge status: Home / Self Care | Payer: Medicare Other | Source: Ambulatory Visit | Attending: Oncology | Admitting: Oncology

## 2017-04-06 ENCOUNTER — Other Ambulatory Visit (HOSPITAL_COMMUNITY)
Admission: RE | Admit: 2017-04-06 | Disposition: A | Discharge status: Home / Self Care | Payer: Medicare Other | Source: Ambulatory Visit | Attending: Oncology | Admitting: Oncology

## 2017-04-06 DIAGNOSIS — Z7984 Long term (current) use of oral hypoglycemic drugs: Secondary | ICD-10-CM | POA: Insufficient documentation

## 2017-04-06 DIAGNOSIS — Z79899 Other long term (current) drug therapy: Secondary | ICD-10-CM | POA: Diagnosis not present

## 2017-04-06 DIAGNOSIS — D472 Monoclonal gammopathy: Secondary | ICD-10-CM

## 2017-04-06 DIAGNOSIS — E1122 Type 2 diabetes mellitus with diabetic chronic kidney disease: Secondary | ICD-10-CM | POA: Diagnosis not present

## 2017-04-06 DIAGNOSIS — N189 Chronic kidney disease, unspecified: Secondary | ICD-10-CM | POA: Diagnosis not present

## 2017-04-06 DIAGNOSIS — Z8546 Personal history of malignant neoplasm of prostate: Secondary | ICD-10-CM | POA: Insufficient documentation

## 2017-04-06 DIAGNOSIS — C9000 Multiple myeloma not having achieved remission: Secondary | ICD-10-CM | POA: Insufficient documentation

## 2017-04-06 DIAGNOSIS — K219 Gastro-esophageal reflux disease without esophagitis: Secondary | ICD-10-CM | POA: Insufficient documentation

## 2017-04-06 DIAGNOSIS — I4891 Unspecified atrial fibrillation: Secondary | ICD-10-CM | POA: Diagnosis not present

## 2017-04-06 DIAGNOSIS — F17210 Nicotine dependence, cigarettes, uncomplicated: Secondary | ICD-10-CM | POA: Insufficient documentation

## 2017-04-06 DIAGNOSIS — F419 Anxiety disorder, unspecified: Secondary | ICD-10-CM | POA: Insufficient documentation

## 2017-04-06 DIAGNOSIS — Z7982 Long term (current) use of aspirin: Secondary | ICD-10-CM | POA: Diagnosis not present

## 2017-04-06 DIAGNOSIS — M069 Rheumatoid arthritis, unspecified: Secondary | ICD-10-CM | POA: Insufficient documentation

## 2017-04-06 DIAGNOSIS — I129 Hypertensive chronic kidney disease with stage 1 through stage 4 chronic kidney disease, or unspecified chronic kidney disease: Secondary | ICD-10-CM | POA: Diagnosis not present

## 2017-04-06 DIAGNOSIS — Z950 Presence of cardiac pacemaker: Secondary | ICD-10-CM | POA: Diagnosis not present

## 2017-04-06 DIAGNOSIS — E7800 Pure hypercholesterolemia, unspecified: Secondary | ICD-10-CM | POA: Insufficient documentation

## 2017-04-06 LAB — PROTIME-INR
INR: 1.1
Prothrombin Time: 14.1 seconds (ref 11.4–15.2)

## 2017-04-06 LAB — CBC WITH DIFFERENTIAL/PLATELET
BASOS ABS: 0 10*3/uL (ref 0–0.1)
BASOS PCT: 1 %
EOS PCT: 3 %
Eosinophils Absolute: 0.2 10*3/uL (ref 0–0.7)
HCT: 33.8 % — ABNORMAL LOW (ref 40.0–52.0)
Hemoglobin: 10.3 g/dL — ABNORMAL LOW (ref 13.0–18.0)
LYMPHS PCT: 8 %
Lymphs Abs: 0.7 10*3/uL — ABNORMAL LOW (ref 1.0–3.6)
MCH: 23 pg — ABNORMAL LOW (ref 26.0–34.0)
MCHC: 30.5 g/dL — ABNORMAL LOW (ref 32.0–36.0)
MCV: 75.6 fL — AB (ref 80.0–100.0)
Monocytes Absolute: 0.5 10*3/uL (ref 0.2–1.0)
Monocytes Relative: 6 %
Neutro Abs: 6.8 10*3/uL — ABNORMAL HIGH (ref 1.4–6.5)
Neutrophils Relative %: 82 %
PLATELETS: 342 10*3/uL (ref 150–440)
RBC: 4.48 MIL/uL (ref 4.40–5.90)
RDW: 32.7 % — ABNORMAL HIGH (ref 11.5–14.5)
WBC: 8.3 10*3/uL (ref 3.8–10.6)

## 2017-04-06 LAB — APTT: aPTT: 36 seconds (ref 24–36)

## 2017-04-06 LAB — GLUCOSE, CAPILLARY: GLUCOSE-CAPILLARY: 117 mg/dL — AB (ref 65–99)

## 2017-04-06 MED ORDER — SODIUM CHLORIDE 0.9 % IV SOLN
INTRAVENOUS | Status: DC
  Administered 2017-04-06: 09:00:00 via INTRAVENOUS

## 2017-04-06 MED ORDER — HEPARIN SOD (PORK) LOCK FLUSH 100 UNIT/ML IV SOLN
INTRAVENOUS | Status: AC
  Filled 2017-04-06: qty 5

## 2017-04-06 MED ORDER — MIDAZOLAM HCL 5 MG/5ML IJ SOLN
INTRAMUSCULAR | Status: AC | PRN
  Administered 2017-04-06 (×3): 0.5 mg via INTRAVENOUS
  Administered 2017-04-06 (×2): 1 mg via INTRAVENOUS
  Administered 2017-04-06: 0.5 mg via INTRAVENOUS

## 2017-04-06 MED ORDER — FENTANYL CITRATE (PF) 100 MCG/2ML IJ SOLN
INTRAMUSCULAR | Status: AC
  Filled 2017-04-06: qty 4

## 2017-04-06 MED ORDER — MIDAZOLAM HCL 5 MG/5ML IJ SOLN
INTRAMUSCULAR | Status: AC
  Filled 2017-04-06: qty 5

## 2017-04-06 MED ORDER — FENTANYL CITRATE (PF) 100 MCG/2ML IJ SOLN
INTRAMUSCULAR | Status: AC | PRN
  Administered 2017-04-06: 12.5 ug via INTRAVENOUS
  Administered 2017-04-06 (×3): 25 ug via INTRAVENOUS
  Administered 2017-04-06: 50 ug via INTRAVENOUS
  Administered 2017-04-06: 12.5 ug via INTRAVENOUS

## 2017-04-06 NOTE — Procedures (Signed)
Myeloma  S/p CT RT ILIAC BM ASP AND CORE BX  No comp Stable Path pending Full report in pacs

## 2017-04-06 NOTE — Progress Notes (Signed)
Pt sitting up in bed, eating and drinking w/o difficulty, in NAD.  Discharge instructions reviewed with pt and family who verbalize understanding.

## 2017-04-06 NOTE — Discharge Instructions (Signed)
Bone Marrow Aspiration and Bone Marrow Biopsy, Adult, Care After °This sheet gives you information about how to care for yourself after your procedure. Your health care provider may also give you more specific instructions. If you have problems or questions, contact your health care provider. °What can I expect after the procedure? °After the procedure, it is common to have: °· Mild pain and tenderness. °· Swelling. °· Bruising. ° °Follow these instructions at home: °· Take over-the-counter or prescription medicines only as told by your health care provider. °· Do not take baths, swim, or use a hot tub until your health care provider approves. Ask if you can take a shower or have a sponge bath. °· Follow instructions from your health care provider about how to take care of the puncture site. Make sure you: °? Wash your hands with soap and water before you change your bandage (dressing). If soap and water are not available, use hand sanitizer. °? Change your dressing as told by your health care provider. °· Check your puncture site every day for signs of infection. Check for: °? More redness, swelling, or pain. °? More fluid or blood. °? Warmth. °? Pus or a bad smell. °· Return to your normal activities as told by your health care provider. Ask your health care provider what activities are safe for you. °· Do not drive for 24 hours if you were given a medicine to help you relax (sedative). °· Keep all follow-up visits as told by your health care provider. This is important. °Contact a health care provider if: °· You have more redness, swelling, or pain around the puncture site. °· You have more fluid or blood coming from the puncture site. °· Your puncture site feels warm to the touch. °· You have pus or a bad smell coming from the puncture site. °· You have a fever. °· Your pain is not controlled with medicine. °This information is not intended to replace advice given to you by your health care provider. Make sure  you discuss any questions you have with your health care provider. °Document Released: 12/11/2004 Document Revised: 12/12/2015 Document Reviewed: 11/05/2015 °Elsevier Interactive Patient Education © 2018 [employer] ° °

## 2017-04-06 NOTE — Consult Note (Signed)
Chief Complaint:  here for CT BM asp and core bx  Referring Physician(s): Yu,Zhou    History of Present Illness: Omar [name] is a 76 y.o. male with plasmacytosis, concern for myeloma.  He is here for CT BM asp and core bx. No complaints today.  Past Medical History:  Diagnosis Date  . Anxiety   . Arthritis    rheumatoid  . Atrial fibrillation (HCC)    hx of  . Chronic kidney disease   . Colon polyp 07-07-15   TUBULAR ADENOMA WITH AT LEAST HIGH-GRADE / Dr Rayann Heman  . Diabetes mellitus without complication ([place])   . Dysrhythmia    bradycardia...Marland KitchenMarland Kitchen2 to 1 heart block  . GERD (gastroesophageal reflux disease)   . Heart murmur    patient unaware of history of murmur  . Hypercholesterolemia   . Hypertension   . Presence of permanent cardiac pacemaker   . Prostate cancer ([place]) 01/01/13, 01/30/14   Gleason 3+4=7, volume 46.6 cc  . Rheumatoid arthritis ([place])   . S/P radiation therapy  04/03/2014 through 06/04/2014                                                      Prostate 7800 cGy in 40 sessions                          . Sleep apnea    uses cpap but it is not in the hospital with him    Past Surgical History:  Procedure Laterality Date  . ABDOMINAL AORTIC ANEURYSM REPAIR  11/2013  . COLON SURGERY  March 2017   Right hemicolectomy for tubulovillous adenoma with high-grade dysplasia.  . COLONOSCOPY WITH PROPOFOL N/A 07/07/2015   Procedure: COLONOSCOPY WITH PROPOFOL;  Surgeon: Josefine Class, MD;  Location: [facility] ENDOSCOPY;  Service: Endoscopy;  Laterality: N/A;  . COLONOSCOPY WITH PROPOFOL N/A 10/28/2016   Procedure: COLONOSCOPY WITH PROPOFOL;  Surgeon: [name], MD;  Location: [facility] ENDOSCOPY;  Service: Endoscopy;  Laterality: N/A;  . ESOPHAGOGASTRODUODENOSCOPY (EGD) WITH PROPOFOL N/A 10/28/2016   Procedure: ESOPHAGOGASTRODUODENOSCOPY (EGD) WITH PROPOFOL;  Surgeon: [name], MD;  Location: [facility] ENDOSCOPY;  Service: Endoscopy;  Laterality: N/A;  . LAPAROSCOPIC  RIGHT COLECTOMY Right 08/08/2015   Procedure: LAPAROSCOPIC RIGHT COLECTOMY;  Surgeon: Robert Bellow, MD;  Location: [facility] ORS;  Service: General;  Laterality: Right;  . NASAL SINUS SURGERY    . PACEMAKER INSERTION Left 08/26/2016   Procedure: INSERTION PACEMAKER;  Surgeon: Isaias Cowman, MD;  Location: [facility] ORS;  Service: Cardiovascular;  Laterality: Left;  . PROSTATE BIOPSY  01/01/13, 01/30/14   Gleason 3+3=6, vol 46.6 cc  . TONSILLECTOMY    . uvula surgery     for sleep apnea    Allergies: Patient has no known allergies.  Medications: Prior to Admission medications   Medication Sig Start Date End Date Taking? Authorizing Provider  ALPRAZolam (XANAX) 0.25 MG tablet Take 0.25 mg by mouth 2 (two) times daily.   Yes [provider]  aspirin 81 MG chewable tablet Chew 81 mg by mouth daily.   Yes [provider]  atorvastatin (LIPITOR) 80 MG tablet Take 80 mg by mouth at bedtime.    Yes [provider]  busPIRone (BUSPAR) 5 MG tablet Take 5 mg by mouth 2 (  two) times daily.    Yes [provider]  Butalbital-APAP-Caffeine 50-300-40 MG CAPS Take 1 capsule by mouth every 6 (six) hours as needed (for migraines).    Yes [provider]  fluticasone (FLONASE) 50 MCG/ACT nasal spray Place 2 sprays into both nostrils daily as needed for rhinitis.    Yes [provider]  Fluticasone Furoate-Vilanterol (BREO ELLIPTA) 100-25 MCG/INH AEPB Inhale 1 puff into the lungs daily.    Yes [provider]  folic acid (FOLVITE) 1 MG tablet Take 1 mg by mouth daily.   Yes [provider]  isosorbide mononitrate (IMDUR) 30 MG 24 hr tablet Take 30 mg by mouth daily.   Yes [provider]  metFORMIN (GLUCOPHAGE) 500 MG tablet Take 500 mg by mouth 2 (two) times daily with a meal.   Yes [provider]  methotrexate (RHEUMATREX) 15 MG tablet Take 15 mg by mouth once a week. Caution: Chemotherapy. Protect from light. 08/31/16   Yes [provider]  pantoprazole (PROTONIX) 40 MG tablet Take 1 tablet (40 mg total) by mouth 2 (two) times daily. 08/27/16  Yes Theodoro Grist, MD  triamcinolone cream (KENALOG) 0.1 % Apply 1 application topically 2 (two) times daily.   Yes [provider]  cetirizine (ZYRTEC) 10 MG tablet Take 10 mg by mouth daily.    [provider]  fexofenadine (ALLEGRA) 180 MG tablet Take 180 mg by mouth daily.    [provider]     Family History  Problem Relation Age of Onset  . Heart attack Mother   . Cirrhosis Father   . Pancreatic cancer Brother   . Diabetes Brother   . Diabetes Daughter        medication induced for cancer treatments  . Cancer Daughter        breast, brain    Social History   Social History  . Marital status: Divorced    Spouse name: N/A  . Number of children: N/A  . Years of education: N/A   Social History Main Topics  . Smoking status: Current Every Day Smoker    Packs/day: 0.50    Years: 50.00    Types: Cigarettes  . Smokeless tobacco: Never Used     Comment: DOWN TO 1/2 PPD  . Alcohol use 0.6 oz/week    1 Cans of beer per week     Comment: occasionally  . Drug use: No  . Sexual activity: Not Asked   Other Topics Concern  . None   Social History Narrative  . None      Review of Systems: A 12 point ROS discussed and pertinent positives are indicated in the HPI above.  All other systems are negative.  Review of Systems  Vital Signs: BP 136/78   Pulse 83   Temp 98 F (36.7 C) (Oral)   Resp 20   Ht '5\' 11"'  (1.803 m)   Wt 207 lb (93.9 kg)   SpO2 99%   BMI 28.87 kg/m   Physical Exam  Constitutional: He is oriented to person, place, and time. He appears well-developed and well-nourished. No distress.  Eyes: Conjunctivae are normal.  Cardiovascular: Normal rate, regular rhythm and normal heart sounds.   No murmur heard. Pulmonary/Chest: Effort normal and breath sounds normal.  Abdominal: Soft. Bowel  sounds are normal.  Musculoskeletal: Normal range of motion. He exhibits no edema.  Neurological: He is alert and oriented to person, place, and time.  Skin: Skin is warm and dry.  He is not diaphoretic.  Psychiatric: He has a normal mood and affect. His behavior is normal.    Imaging: Ct Biopsy  Result Date: 03/21/2017 INDICATION: MULTIPLE MYELOMA EXAM: CT-GUIDED BONE MARROW BIOPSY MEDICATIONS: None. ANESTHESIA/SEDATION: Moderate (conscious) sedation was employed during this procedure. A total of Versed 1 mg and Fentanyl 75 mcg was administered intravenously. Moderate Sedation Time: 17 minutes. The patient's level of consciousness and vital signs were monitored continuously by radiology nursing throughout the procedure under my direct supervision. FLUOROSCOPY TIME:  Not applicable COMPLICATIONS: None immediate. PROCEDURE: Informed written consent was obtained from the patient after a thorough discussion of the procedural risks, benefits and alternatives. All questions were addressed. Maximal Sterile Barrier Technique was utilized including caps, mask, sterile gowns, sterile gloves, sterile drape, hand hygiene and skin antiseptic. A timeout was performed prior to the initiation of the procedure. Utilizing 0.25% bupivacaine as a local and deep periosteal anesthetic, an Arrow Oncontrol bone biopsy needle was placed into the left iliac bone. Initial aspirates were obtained and sent for pathologic evaluation. Subsequently, a 2.5 cm long bone biopsy core was obtained without difficulty. This was split into 2 pieces. The patient tolerated the procedure well and was returned his room in satisfactory condition. IMPRESSION: Successful CT-guided bone marrow biopsy as described. Electronically Signed   By: Inez Catalina M.D.   On: 03/21/2017 10:01   Dg Bone Survey Met  Result Date: 03/16/2017 CLINICAL DATA:  77 year old male with protein urea. Possible multiple myeloma. EXAM: METASTATIC BONE SURVEY COMPARISON:   CT Abdomen and Pelvis 07/27/2016. Chest radiographs 08/23/2016 and earlier. FINDINGS: Calvarium bone mineralization is within normal limits. Chronic disc and endplate degeneration in the cervical spine with straightening of lordosis and mild spondylolisthesis. Cervical spine bone mineralization within normal limits. Cervicothoracic junction alignment is within normal limits. Normal thoracic spine segmentation. Normal thoracic vertebral height, alignment, and bone mineralization. Normal lumbar spine segmentation and mineralization. Stable lumbar vertebral height and alignment with grade 1 anterolisthesis at L4-L5. Left chest cardiac pacemaker is new since March. Mediastinal contours are stable. Rib bone mineralization appears within normal limits. Pelvis bone mineralization is within normal limits. Negative abdominal and pelvic visceral contours. Small surgical clips at the symphysis pubis. Degenerative changes at both glenohumeral joints. There is a circumscribed lucent lesion measuring 15-16 mm in the distal right ulna. This has a benign appearance, and bilateral upper extremity bone mineralization is otherwise normal. Bilateral lower extremity bone mineralization is normal. IMPRESSION: Nonspecific lucent lesion in the distal right ulna measuring 15-16 mm, but otherwise normal bone mineralization for age throughout the visible skeleton. A solitary lytic lesion in a distal extremity would be an unusual presentation of multiple myeloma, and I favor the distal right ulna lesion is benign. Recommend correlation with serum and urine protein electrophoresis. Electronically Signed   By: Genevie Ann M.D.   On: 03/16/2017 13:59    Labs:  CBC:  Recent Labs  08/23/16 0333 08/24/16 1333 03/08/17 1055 03/21/17 0732 04/06/17 0846  WBC 10.1  --  9.2 7.5 8.3  HGB 9.1* 9.9* 8.7* 8.0* 10.3*  HCT 28.1*  --  27.7* 26.0* 33.8*  PLT 206  --  348 272 342    COAGS:  Recent Labs  03/21/17 0732 04/06/17 0846  INR 1.10  1.10  APTT 35 36    BMP:  Recent Labs  08/24/16 1333  08/26/16 0358 08/27/16 0450 08/27/16 1353 03/08/17 1055  NA 137  --  136 135  --  136  K 3.8  --  4.4 4.6  --  3.7  CL 108  --  108 104  --  106  CO2 22  --  22 23  --  20*  GLUCOSE 105*  --  104* 105*  --  108*  BUN 22*  --  23* 24*  --  14  CALCIUM 8.8*  --  8.6* 8.6*  --  9.0  CREATININE 1.58*  < > 1.48* 1.55* 1.62* 1.19  GFRNONAA 41*  < > 45* 42* 40* 58*  GFRAA 48*  < > 52* 49* 47* >60  < > = values in this interval not displayed.  LIVER FUNCTION TESTS:  Recent Labs  03/08/17 1055  BILITOT 0.5  AST 20  ALT 16*  ALKPHOS 66  PROT 8.0  ALBUMIN 3.8    TUMOR MARKERS: No results for input(s): AFPTM, CEA, CA199, CHROMGRNA in the last 8760 hours.  Assessment and Plan:  Plan for Right iliac BM asp and core bx today.  Risks and benefits discussed with the patient including, but not limited to bleeding, infection, damage to adjacent structures or low yield requiring additional tests. All of the patient's questions were answered, patient is agreeable to proceed. Consent signed and in chart.    Thank you for this interesting consult.  I greatly enjoyed meeting Arnez Stoneking and look forward to participating in their care.  A copy of this report was sent to the requesting provider on this date.  Electronically Signed: Greggory Keen, MD 04/06/2017, 10:04 AM   I spent a total of  15 Minutes   in face to face in clinical consultation, greater than 50% of which was counseling/coordinating care for this patient with myeloma

## 2017-04-07 ENCOUNTER — Encounter (HOSPITAL_COMMUNITY): Payer: Self-pay

## 2017-04-07 LAB — TISSUE HYBRIDIZATION (BONE MARROW)-NCBH

## 2017-04-07 LAB — CHROMOSOME ANALYSIS, BONE MARROW

## 2017-04-12 ENCOUNTER — Other Ambulatory Visit: Payer: Self-pay | Admitting: *Deleted

## 2017-04-13 ENCOUNTER — Other Ambulatory Visit: Payer: Self-pay | Admitting: Oncology

## 2017-04-13 ENCOUNTER — Telehealth: Payer: Self-pay | Admitting: *Deleted

## 2017-04-13 DIAGNOSIS — C9000 Multiple myeloma not having achieved remission: Secondary | ICD-10-CM

## 2017-04-13 NOTE — Telephone Encounter (Signed)
Asking if PET order should be total body and not just base of skull to thighs. Please advise and change order if appropriate

## 2017-04-14 ENCOUNTER — Encounter
Admission: RE | Admit: 2017-04-14 | Discharge: 2017-04-14 | Disposition: A | Discharge status: Home / Self Care | Payer: Medicare Other | Source: Ambulatory Visit | Attending: Oncology | Admitting: Oncology

## 2017-04-14 DIAGNOSIS — C9000 Multiple myeloma not having achieved remission: Secondary | ICD-10-CM | POA: Diagnosis present

## 2017-04-14 LAB — GLUCOSE, CAPILLARY: Glucose-Capillary: 97 mg/dL (ref 65–99)

## 2017-04-14 MED ORDER — FLUDEOXYGLUCOSE F - 18 (FDG) INJECTION
12.6500 | Freq: Once | INTRAVENOUS | Status: AC | PRN
  Administered 2017-04-14: 12.65 via INTRAVENOUS

## 2017-04-14 NOTE — Progress Notes (Signed)
Hematology/Oncology Follow Up Note [facility] Telephone:([phone] Fax:([phone]) [phone]  Patient Care Team: Jodi Marble, MD as PCP - General (Internal Medicine) Bary Castilla, Forest Gleason, MD (General Surgery) Edrick Kins, MD as Rounding Team (Internal Medicine) Hillary Bow, MD as Consulting Physician (Internal Medicine) [name], MD as Surgeon (Gastroenterology) Isaias Cowman, MD as Consulting Physician (Cardiology)  REFERRING PROVIDER: Dr.Lateef, [place] CHIEF COMPLAINTS/REASON FOR VISIT  Abnormal UPEP results, follow up for evaluation of monoclonal gammopathy  HISTORY OF PRESENTING ILLNESS:  76 y.o.  male with PMH listed below who presents to follow up on the evaluation and management of his abnormal urine protein electrophoresis results. I reviewed the records from [facility], [place] and [place] was performed and HemOnc related medical problems are listed below.  He lives with a friend. He has 3 adult children.   1 Chronic Kidney disease, Stage II: patient follows up with Dr.Lateef.  2 Proteinuria:  02/18/2017: Albumin/Creatinine ratio was 7, Urine protein electrophoresis,Random Urine revealed M Spike 43.2%, total protein of 43.16m/dl,  01/05/2017 Albumin/Creatinine ratio was 455.2, Albumin 996.5, , 2.15.2018 Albumin/Creatinine ratio was 80.3, Albumin 73.3, Urine protein electrophoresis,Random Urine revealed M Spike 29.7%, total protein of 29.125mdl, 06/30/2016 Serum protein electrophoresis did not detect M spike.  Autoimmune disorder work up showed Negative anti dsDNA, RNP antibodies, smith antibody, Sjogren antibodies, anti Jo-1, positive antichromotin antibodies, positive ANA,  3 Anemia of chronic kidney disease:  4 Iron deficiency anemia: history of iron deficiency, ferritin was 7 on 08/11/2016. He had EGD and colonoscopy that were done this year which showed  duodenitis/esphagitis/diverticulosis/benigh polyps. 5 Rheumatoid arthritis: he follows up with Dr.Kernodle and is on chronic MTX. He reports joint pains are controlled, not quite symptomatic.   Patient denies any persistent bone pain or any pain. He continue to feel lack of energy, persistent, not improved with resting or taking naps. He also has lost 25 pounds in the past year which was unintentional.   Review of Systems - Oncology Constitutional: Negative for fever, night sweats, (+) unintentional weight loss, (+) Fatigue HENT: Negative for ear pain, hearing loss, nasal bleeding Eyes: Negative for eye pain, double vision   Respiratory: Negative for wheezing, shortness of breath, cough Cardiovascular: Negative for chest pain, palpitation.   Gastrointestinal: Negative abdominal pain, diarrhea, nausea vomiting Endocrine: Negative  Genitourinary: Negative for dysuria, hematuria, frequency Skin: Negative for rash, iching, bruising Neurological: Negative for headache, dizziness, seizure Hematological: Negative for easy bruising/bleeding, lymph node enlargement Psychiatric/Behavioral: Negative for depression, anxiety, suicidality MEDICAL HISTORY:  Past Medical History:  Diagnosis Date  . Anxiety   . Arthritis    rheumatoid  . Atrial fibrillation (HCC)    hx of  . Chronic kidney disease   . Colon polyp 07-07-15   TUBULAR ADENOMA WITH AT LEAST HIGH-GRADE / Dr ReRayann Heman. Diabetes mellitus without complication ([place]  . Dysrhythmia    bradycardia.....Marland KitchenMarland Kitchento 1 heart block  . GERD (gastroesophageal reflux disease)   . Heart murmur    patient unaware of history of murmur  . Hypercholesterolemia   . Hypertension   . Presence of permanent cardiac pacemaker   . [place]   Gleason 3+4=7, volume 46.6 cc  . [place]  . S/P radiation therapy  04/03/2014 through 06/04/2014  Prostate 7800 cGy in 40  sessions                          . Sleep apnea    uses cpap but it is not in the hospital with him    SURGICAL HISTORY: Past Surgical History:  Procedure Laterality Date  . ABDOMINAL AORTIC ANEURYSM REPAIR  11/2013  . COLON SURGERY  March 2017   Right hemicolectomy for tubulovillous adenoma with high-grade dysplasia.  Marland Kitchen NASAL SINUS SURGERY    . PROSTATE BIOPSY  01/01/13, 01/30/14   Gleason 3+3=6, vol 46.6 cc  . TONSILLECTOMY    . uvula surgery     for sleep apnea    SOCIAL HISTORY: Social History   Socioeconomic History  . Marital status: Divorced    Spouse name: Not on file  . Number of children: Not on file  . Years of education: Not on file  . Highest education level: Not on file  Social Needs  . Financial resource strain: Not on file  . Food insecurity - worry: Not on file  . Food insecurity - inability: Not on file  . Transportation needs - medical: Not on file  . Transportation needs - non-medical: Not on file  Occupational History  . Not on file  Tobacco Use  . Smoking status: Current Every Day Smoker    Packs/day: 0.50    Years: 50.00    Pack years: 25.00    Types: Cigarettes  . Smokeless tobacco: Never Used  . Tobacco comment: DOWN TO 1/2 PPD  Substance and Sexual Activity  . Alcohol use: Yes    Alcohol/week: 0.6 oz    Types: 1 Cans of beer per week    Comment: occasionally  . Drug use: No  . Sexual activity: Not on file  Other Topics Concern  . Not on file  Social History Narrative  . Not on file    FAMILY HISTORY: Family History  Problem Relation Age of Onset  . Heart attack Mother   . Cirrhosis Father   . Pancreatic cancer Brother   . Diabetes Brother   . Diabetes Daughter        medication induced for cancer treatments  . Cancer Daughter        breast, brain    ALLERGIES:  has No Known Allergies.  MEDICATIONS:  Current Outpatient Medications  Medication Sig Dispense Refill  . ALPRAZolam (XANAX) 0.25 MG tablet Take 0.25 mg by mouth  2 (two) times daily.    Marland Kitchen aspirin 81 MG chewable tablet Chew 81 mg by mouth daily.    Marland Kitchen atorvastatin (LIPITOR) 80 MG tablet Take 80 mg by mouth at bedtime.     . busPIRone (BUSPAR) 5 MG tablet Take 5 mg by mouth 2 (two) times daily.     . Butalbital-APAP-Caffeine 50-300-40 MG CAPS Take 1 capsule by mouth every 6 (six) hours as needed (for migraines).     . cetirizine (ZYRTEC) 10 MG tablet Take 10 mg by mouth daily.    . fexofenadine (ALLEGRA) 180 MG tablet Take 180 mg by mouth daily.    . fluticasone (FLONASE) 50 MCG/ACT nasal spray Place 2 sprays into both nostrils daily as needed for rhinitis.     . Fluticasone Furoate-Vilanterol (BREO ELLIPTA) 100-25 MCG/INH AEPB Inhale 1 puff into the lungs daily.     . folic acid (FOLVITE) 1 MG tablet Take 1 mg by mouth daily.    . isosorbide mononitrate (  IMDUR) 30 MG 24 hr tablet Take 30 mg by mouth daily.    . metFORMIN (GLUCOPHAGE) 500 MG tablet Take 500 mg by mouth 2 (two) times daily with a meal.    . methotrexate (RHEUMATREX) 15 MG tablet Take 15 mg by mouth once a week. Caution: Chemotherapy. Protect from light.    . pantoprazole (PROTONIX) 40 MG tablet Take 1 tablet (40 mg total) by mouth 2 (two) times daily. 60 tablet 3  . triamcinolone cream (KENALOG) 0.1 % Apply 1 application topically 2 (two) times daily.     No current facility-administered medications for this visit.       Marland Kitchen  PHYSICAL EXAMINATION: ECOG PERFORMANCE STATUS: 1 - Symptomatic but completely ambulatory Vitals:   04/15/17 0937  BP: 129/79  Pulse: 82  Temp: 97.7 F (36.5 C)   Filed Weights   04/15/17 0937  Weight: 201 lb (91.2 kg)   GENERAL: No distress, well nourished.  SKIN:  No rashes or significant lesions  HEAD: Normocephalic, No masses, lesions, tenderness or abnormalities  EYES: Conjunctiva are pink, non icteric ENT: External ears normal ,lips , buccal mucosa, and tongue normal and mucous membranes are moist  LYMPH: No palpable cervical and axillary  lymphadenopathy  LUNGS: Clear to auscultation, no crackles or wheezes HEART: Regular rate & rhythm, no murmurs, no gallops, S1 normal and S2 normal  ABDOMEN: Abdomen soft, non-tender, normal bowel sounds, I did not appreciate any  masses or organomegaly  MUSCULOSKELETAL: No CVA tenderness and no tenderness on percussion of the back or rib cage.  EXTREMITIES: No edema, no skin discoloration or tenderness NEURO: Alert & oriented, no focal motor/sensory deficits.  LABORATORY DATA:  I have reviewed the data as listed Lab Results  Component Value Date   WBC 8.3 04/06/2017   HGB 10.3 (L) 04/06/2017   HCT 33.8 (L) 04/06/2017   MCV 75.6 (L) 04/06/2017   PLT 342 04/06/2017   Recent Labs    08/26/16 0358 08/27/16 0450 08/27/16 1353 03/08/17 1055  NA 136 135  --  136  K 4.4 4.6  --  3.7  CL 108 104  --  106  CO2 22 23  --  20*  GLUCOSE 104* 105*  --  108*  BUN 23* 24*  --  14  CREATININE 1.48* 1.55* 1.62* 1.19  CALCIUM 8.6* 8.6*  --  9.0  GFRNONAA 45* 42* 40* 58*  GFRAA 52* 49* 47* >60  PROT  --   --   --  8.0  ALBUMIN  --   --   --  3.8  AST  --   --   --  20  ALT  --   --   --  16*  ALKPHOS  --   --   --  66  BILITOT  --   --   --  0.5    SPEP: no M spike Serum free light chain Kappa/Lamda ration 13.04 UPEP  Free light chain Kappa/lamda ratio 162.43, M spike 140m/24 hour   Bone marrow biopsy 03/21/2017  Bone Marrow, Aspirate,Biopsy, and Clot, left iliac - HYPERCELLULAR BONE MARROW FOR AGE WITH TRILINEAGE HEMATOPOIESIS. - PLASMACYTOSIS (PLASMA CELLS 8%)  Karyotype: loss of Y chromosome Cytogenetic MDS FISH Panel negative.   Bone marrow 04/06/2017  Bone Marrow, Aspirate,Biopsy, and Clot, right iliac and core - HYPERCELLULAR BONE MARROW FOR AGE WITH PLASMA CELL NEOPLASM - TRILINEAGE HEMATOPOIESIS. - SEE COMMENT. PERIPHERAL BLOOD: - MICROCYTIC-HYPOCHROMIC ANEMIA. Diagnosis Note The bone marrow is hypercellular with trilineage  hematopoiesis but with relative  abundance of erythroid precursors and increased number of megakaryocytes with nonspecific changes. Significant dyspoiesis is not seen. Iron stores are present with no ring sideroblasts. The plasma cells are increased in number representing 8% of all cells in the aspirate although focal areas in the core biopsy show 10 to 20% as primarily seen by CD138 stain. In situ hybridization for kappa and lambda light chains show kappa light chain restriction consistent with plasma cell neoplasm. Correlation with cytogenetic and FISH studies is recommended. (BNS:ecj/gt 04/07/2017)  IMAGE STUDIES I have personally reviewed below image results.  03/15/2017 DG bone survey Met: Nonspecific lucent lesion in the distal right ulna measuring 15-16 mm, but otherwise normal bone mineralization for age throughout the visible skeleton. A solitary lytic lesion in a distal extremity would be an unusual presentation of multiple myeloma, and I favor the distal right ulna lesion is benign. Recommend correlation with serum and urine protein electrophoresis.  04/14/2017 PET scan: 1. 2.1 cm hypermetabolic soft tissue lesion in the left heel, adjacent to the calcaneal tuberosity. 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 2. Mottled FDG accumulation diffusely in the marrow space without other frankly overt hypermetabolic bony lesion. 3. Coronary artery and thoracoabdominal aortic atherosclerosis with abdominal aortic stent graft visualized in situ. 4. Bilateral renal cysts and bilateral nonobstructing renal stones.    ASSESSMENT & PLAN:  1. Multiple myeloma, remission status unspecified ([place])   2. Iron deficiency anemia, unspecified iron deficiency anemia type   3. CKD (chronic kidney disease) stage 2, GFR 60-89 ml/min   # s/p IV Venofer x 4 doses.  #Discussed with patient about bone marrow biopsy results as well as image results. He has active multiple myeloma, standard risk.  Plan RVD regimen x 4 cycles. Chemotherapy class.  Will start process for getting medication approved.   I explained to the patient the risks and benefits of RVD chemotherapy including all but not limited to nausea, vomiting low blood counts, bleeding, blood clots and risk of life threatening infection and even death, secondary malignancy etc.  Risk of neuropathy is associated with Velcade . # Chemotherapy education; port placement. Hopefully the planned start chemotherapy next week.  All questions were answered. The patient knows to call the clinic with any problems questions or concerns.  Return of visit: Day 1 of RVD Thank you for this kind referral and the opportunity to participate in the care of this patient. A copy of today's note is routed to referring provider    Earlie Server, MD, PhD Hematology Oncology [facility] Pager- [phone] 04/14/2017

## 2017-04-14 NOTE — Telephone Encounter (Signed)
Oral Oncology Pharmacist Encounter  Received new prescription for Revlimid for the treatment of Multiple Myeloma in conjunction with dexamethasone, planned duration until disease progression or unacceptable drug toxicity.  CBC from 04/06/17 and CMP from 03/08/17 assessed, no relevant lab abnormalities. Prescription dose and frequency assessed.   Current medication list in Epic reviewed, no DDIs with Revlimid identified.   Patient education Counseled patient and his daughter following his OV on administration, dosing, side effects, monitoring, drug-food interactions, safe handling, storage, and disposal. Patient will take one capsule (82m) daily on days 1-14 every 21 days..  Side effects include but not limited to: N/V/D, fatigue, constipation, rash, decrease in WBC/RBC/Hgb .    Provided patient with medication handout.   Reviewed with patient importance of keeping a medication schedule and plan for any missed doses.  Mr. GWestbergand his daughter voiced understanding and appreciation. All questions answered.  Oral Oncology Clinic will continue to follow for insurance authorization, copayment issues, initial counseling and start date.  Provided patient with [facility] Clinic[facility] number. Patient knows to call the office with questions or concerns. Oral Chemotherapy Navigation Clinic will continue to follow.  ADarl Pikes PharmD, BCPS Hematology/Oncology Clinical Pharmacist [phone] 04/14/2017 3:12 PM

## 2017-04-15 ENCOUNTER — Telehealth: Payer: Self-pay | Admitting: Pharmacist

## 2017-04-15 ENCOUNTER — Encounter: Payer: Self-pay | Admitting: Oncology

## 2017-04-15 ENCOUNTER — Inpatient Hospital Stay: Payer: Medicare Other | Attending: Oncology | Admitting: Oncology

## 2017-04-15 ENCOUNTER — Other Ambulatory Visit: Payer: Self-pay

## 2017-04-15 VITALS — BP 129/79 | HR 82 | Temp 97.7°F | Wt 201.0 lb

## 2017-04-15 DIAGNOSIS — D631 Anemia in chronic kidney disease: Secondary | ICD-10-CM | POA: Insufficient documentation

## 2017-04-15 DIAGNOSIS — K219 Gastro-esophageal reflux disease without esophagitis: Secondary | ICD-10-CM | POA: Insufficient documentation

## 2017-04-15 DIAGNOSIS — M799 Soft tissue disorder, unspecified: Secondary | ICD-10-CM

## 2017-04-15 DIAGNOSIS — Z8546 Personal history of malignant neoplasm of prostate: Secondary | ICD-10-CM | POA: Diagnosis not present

## 2017-04-15 DIAGNOSIS — Z79899 Other long term (current) drug therapy: Secondary | ICD-10-CM

## 2017-04-15 DIAGNOSIS — Z950 Presence of cardiac pacemaker: Secondary | ICD-10-CM | POA: Diagnosis not present

## 2017-04-15 DIAGNOSIS — C9000 Multiple myeloma not having achieved remission: Secondary | ICD-10-CM | POA: Insufficient documentation

## 2017-04-15 DIAGNOSIS — Z7984 Long term (current) use of oral hypoglycemic drugs: Secondary | ICD-10-CM | POA: Diagnosis not present

## 2017-04-15 DIAGNOSIS — D509 Iron deficiency anemia, unspecified: Secondary | ICD-10-CM | POA: Insufficient documentation

## 2017-04-15 DIAGNOSIS — I129 Hypertensive chronic kidney disease with stage 1 through stage 4 chronic kidney disease, or unspecified chronic kidney disease: Secondary | ICD-10-CM | POA: Insufficient documentation

## 2017-04-15 DIAGNOSIS — E1122 Type 2 diabetes mellitus with diabetic chronic kidney disease: Secondary | ICD-10-CM | POA: Diagnosis not present

## 2017-04-15 DIAGNOSIS — N182 Chronic kidney disease, stage 2 (mild): Secondary | ICD-10-CM

## 2017-04-15 DIAGNOSIS — F419 Anxiety disorder, unspecified: Secondary | ICD-10-CM | POA: Diagnosis not present

## 2017-04-15 DIAGNOSIS — Z86010 Personal history of colonic polyps: Secondary | ICD-10-CM | POA: Diagnosis not present

## 2017-04-15 DIAGNOSIS — F17210 Nicotine dependence, cigarettes, uncomplicated: Secondary | ICD-10-CM | POA: Diagnosis not present

## 2017-04-15 DIAGNOSIS — M069 Rheumatoid arthritis, unspecified: Secondary | ICD-10-CM | POA: Insufficient documentation

## 2017-04-15 DIAGNOSIS — E7800 Pure hypercholesterolemia, unspecified: Secondary | ICD-10-CM | POA: Insufficient documentation

## 2017-04-15 DIAGNOSIS — Z7982 Long term (current) use of aspirin: Secondary | ICD-10-CM

## 2017-04-15 DIAGNOSIS — I4891 Unspecified atrial fibrillation: Secondary | ICD-10-CM | POA: Insufficient documentation

## 2017-04-15 DIAGNOSIS — K2980 Duodenitis without bleeding: Secondary | ICD-10-CM | POA: Insufficient documentation

## 2017-04-18 ENCOUNTER — Other Ambulatory Visit: Payer: Self-pay | Admitting: Oncology

## 2017-04-18 ENCOUNTER — Telehealth: Payer: Self-pay | Admitting: *Deleted

## 2017-04-18 ENCOUNTER — Other Ambulatory Visit: Payer: Self-pay | Admitting: *Deleted

## 2017-04-18 DIAGNOSIS — C61 Malignant neoplasm of prostate: Secondary | ICD-10-CM

## 2017-04-18 NOTE — Telephone Encounter (Signed)
Notified pt of US guided biopsy of heel scheduled for 04/27/17 arrive at 10:30, advised pt to have a driver with him for this procedure.

## 2017-04-19 DIAGNOSIS — C9000 Multiple myeloma not having achieved remission: Secondary | ICD-10-CM | POA: Insufficient documentation

## 2017-04-19 DIAGNOSIS — D472 Monoclonal gammopathy: Secondary | ICD-10-CM | POA: Insufficient documentation

## 2017-04-19 MED ORDER — LENALIDOMIDE 25 MG PO CAPS: ORAL_CAPSULE | ORAL | 3 refills | Status: DC

## 2017-04-19 MED ORDER — ONDANSETRON HCL 8 MG PO TABS: 8.0000 mg | ORAL_TABLET | Freq: Two times a day (BID) | ORAL | 1 refills | Status: DC | PRN

## 2017-04-19 MED ORDER — ACYCLOVIR 400 MG PO TABS: 400.0000 mg | ORAL_TABLET | Freq: Two times a day (BID) | ORAL | 3 refills | Status: DC

## 2017-04-19 MED ORDER — DEXAMETHASONE 4 MG PO TABS: ORAL_TABLET | ORAL | 3 refills | Status: DC

## 2017-04-19 NOTE — Progress Notes (Signed)
START ON PATHWAY REGIMEN - Multiple Myeloma and Other Plasma Cell Dyscrasias     A cycle is every 21 days:     Bortezomib      Lenalidomide      Dexamethasone   **Always confirm dose/schedule in your pharmacy ordering system**  Patient Characteristics: Newly Diagnosed, Transplant Eligible, Standard Risk R-ISS Staging: I Disease Classification: Newly Diagnosed Is Patient Eligible for Transplant<= Transplant Eligible Risk Status: Standard Risk Intent of Therapy: Curative Intent, Discussed with Patient 

## 2017-04-20 NOTE — Patient Instructions (Signed)

## 2017-04-21 ENCOUNTER — Inpatient Hospital Stay: Payer: Medicare Other

## 2017-04-22 ENCOUNTER — Encounter (HOSPITAL_COMMUNITY): Payer: Self-pay

## 2017-04-22 LAB — CHROMOSOME ANALYSIS, BONE MARROW

## 2017-04-22 LAB — TISSUE HYBRIDIZATION (BONE MARROW)-NCBH

## 2017-04-26 ENCOUNTER — Telehealth: Payer: Self-pay | Admitting: Pharmacist

## 2017-04-26 DIAGNOSIS — C9000 Multiple myeloma not having achieved remission: Secondary | ICD-10-CM

## 2017-04-26 MED ORDER — LENALIDOMIDE 25 MG PO CAPS: ORAL_CAPSULE | ORAL | 3 refills | Status: DC

## 2017-04-26 MED ORDER — DEXAMETHASONE 4 MG PO TABS: ORAL_TABLET | ORAL | 3 refills | Status: DC

## 2017-04-26 NOTE — Telephone Encounter (Signed)
Oral Chemotherapy Pharmacist Encounter  Received Revlimid REMS # for Mr. Tissue today. E-scribing to [employer]. They can Carepak medications and his would be helpful for this patient's Rev/dex.  REMS Auth #: [id number]  Supportive information was faxed along with the prescription.  Darl Pikes, PharmD, BCPS Hematology/Oncology Clinical Pharmacist [facility] [place] [facility] [phone]  04/26/2017 4:24 PM

## 2017-04-27 ENCOUNTER — Ambulatory Visit: Payer: Medicare Other

## 2017-05-02 ENCOUNTER — Inpatient Hospital Stay: Payer: Medicare Other | Admitting: Oncology

## 2017-05-02 ENCOUNTER — Other Ambulatory Visit: Payer: Self-pay

## 2017-05-02 ENCOUNTER — Other Ambulatory Visit: Payer: Self-pay | Admitting: Oncology

## 2017-05-02 ENCOUNTER — Encounter: Payer: Self-pay | Admitting: Oncology

## 2017-05-02 ENCOUNTER — Inpatient Hospital Stay: Payer: Medicare Other

## 2017-05-02 ENCOUNTER — Ambulatory Visit: Payer: Self-pay | Admitting: Oncology

## 2017-05-02 VITALS — BP 110/65 | HR 69 | Temp 95.4°F | Wt 202.4 lb

## 2017-05-02 DIAGNOSIS — Z7982 Long term (current) use of aspirin: Secondary | ICD-10-CM | POA: Diagnosis not present

## 2017-05-02 DIAGNOSIS — D509 Iron deficiency anemia, unspecified: Secondary | ICD-10-CM

## 2017-05-02 DIAGNOSIS — Z8546 Personal history of malignant neoplasm of prostate: Secondary | ICD-10-CM

## 2017-05-02 DIAGNOSIS — C9000 Multiple myeloma not having achieved remission: Secondary | ICD-10-CM

## 2017-05-02 DIAGNOSIS — Z79899 Other long term (current) drug therapy: Secondary | ICD-10-CM

## 2017-05-02 DIAGNOSIS — Z7189 Other specified counseling: Secondary | ICD-10-CM | POA: Insufficient documentation

## 2017-05-02 DIAGNOSIS — F17210 Nicotine dependence, cigarettes, uncomplicated: Secondary | ICD-10-CM

## 2017-05-02 LAB — CBC WITH DIFFERENTIAL/PLATELET
BASOS PCT: 1 %
Basophils Absolute: 0.1 10*3/uL (ref 0–0.1)
EOS ABS: 0.3 10*3/uL (ref 0–0.7)
EOS PCT: 3 %
HEMATOCRIT: 36.6 % — AB (ref 40.0–52.0)
Hemoglobin: 11.3 g/dL — ABNORMAL LOW (ref 13.0–18.0)
Lymphocytes Relative: 14 %
Lymphs Abs: 1.3 10*3/uL (ref 1.0–3.6)
MCH: 23.3 pg — ABNORMAL LOW (ref 26.0–34.0)
MCHC: 30.9 g/dL — AB (ref 32.0–36.0)
MCV: 75.3 fL — ABNORMAL LOW (ref 80.0–100.0)
MONO ABS: 0.4 10*3/uL (ref 0.2–1.0)
MONOS PCT: 4 %
Neutro Abs: 7.6 10*3/uL — ABNORMAL HIGH (ref 1.4–6.5)
Neutrophils Relative %: 78 %
Platelets: 290 10*3/uL (ref 150–440)
RBC: 4.86 MIL/uL (ref 4.40–5.90)
RDW: 29.8 % — AB (ref 11.5–14.5)
WBC: 9.7 10*3/uL (ref 3.8–10.6)

## 2017-05-02 LAB — COMPREHENSIVE METABOLIC PANEL
ALK PHOS: 66 U/L (ref 38–126)
ALT: 15 U/L — AB (ref 17–63)
AST: 16 U/L (ref 15–41)
Albumin: 4 g/dL (ref 3.5–5.0)
Anion gap: 9 (ref 5–15)
BILIRUBIN TOTAL: 0.6 mg/dL (ref 0.3–1.2)
BUN: 14 mg/dL (ref 6–20)
CALCIUM: 9.1 mg/dL (ref 8.9–10.3)
CO2: 20 mmol/L — AB (ref 22–32)
CREATININE: 1.25 mg/dL — AB (ref 0.61–1.24)
Chloride: 109 mmol/L (ref 101–111)
GFR calc non Af Amer: 55 mL/min — ABNORMAL LOW (ref >60)
GLUCOSE: 98 mg/dL (ref 65–99)
Potassium: 4.5 mmol/L (ref 3.5–5.1)
SODIUM: 138 mmol/L (ref 135–145)
TOTAL PROTEIN: 7.9 g/dL (ref 6.5–8.1)

## 2017-05-02 NOTE — Progress Notes (Signed)
Patient here today for follow up.   

## 2017-05-02 NOTE — Progress Notes (Addendum)
Hematology/Oncology Follow Up Note [facility] [phone] [phone]  Patient Care Team: Jodi Marble, MD as PCP - General (Internal Medicine) Bary Castilla, Forest Gleason, MD (General Surgery) Edrick Kins, MD as Rounding Team (Internal Medicine) Hillary Bow, MD as Consulting Physician (Internal Medicine) [name], MD as Surgeon (Gastroenterology) Isaias Cowman, MD as Consulting Physician (Cardiology)  REFERRING PROVIDER: Dr.Lateef, [place] CHIEF COMPLAINTS/REASON FOR VISIT  Abnormal UPEP results, follow up for evaluation of monoclonal gammopathy  HISTORY OF PRESENTING ILLNESS:  76 y.o.  male with PMH listed below who presents to follow up on the evaluation and management of his abnormal urine protein electrophoresis results. I reviewed the records from [facility], [place] and [place] was performed and HemOnc related medical problems are listed below.  He lives with a friend. He has 3 adult children.   1 Chronic Kidney disease, Stage II: patient follows up with Dr.Lateef.  2 Proteinuria:  02/18/2017: Albumin/Creatinine ratio was 7, Urine protein electrophoresis,Random Urine revealed M Spike 43.2%, total protein of 43.83m/dl,  01/05/2017 Albumin/Creatinine ratio was 455.2, Albumin 996.5, , 2.15.2018 Albumin/Creatinine ratio was 80.3, Albumin 73.3, Urine protein electrophoresis,Random Urine revealed M Spike 29.7%, total protein of 29.175mdl, 06/30/2016 Serum protein electrophoresis did not detect M spike.  Autoimmune disorder work up showed Negative anti dsDNA, RNP antibodies, smith antibody, Sjogren antibodies, anti Jo-1, positive antichromotin antibodies, positive ANA,  3 Anemia of chronic kidney disease:  4 Iron deficiency anemia: history of iron deficiency, ferritin was 7 on 08/11/2016. He had EGD and colonoscopy that were done this year which showed  duodenitis/esphagitis/diverticulosis/benigh polyps. 5 Rheumatoid arthritis: he follows up with Dr.Kernodle and is on chronic MTX. He reports joint pains are controlled, not quite symptomatic.   Patient denies any persistent bone pain or any pain. He continue to feel lack of energy, persistent, not improved with resting or taking naps. He also has lost 25 pounds in the past year which was unintentional.   INTERVAL HISTORY Patient presents for follow up. He has not received medication from specialty pharmacy yet. He has no new complaints today. Chemotherapy class is scheduled tomorrow.   Review of Systems  Constitutional: Positive for fatigue. Negative for appetite change.  HENT:   Negative for hearing loss.   Eyes: Negative for eye problems.  Respiratory: Negative for chest tightness.   Cardiovascular: Negative for chest pain.  Endocrine: Negative for hot flashes.  Genitourinary: Negative for difficulty urinating.   Musculoskeletal: Negative for arthralgias.  Skin: Negative for itching.  Neurological: Negative for dizziness.  Hematological: Negative for adenopathy.  Psychiatric/Behavioral: The patient is not nervous/anxious.      MEDICAL HISTORY:  Past Medical History:  Diagnosis Date  . Anxiety   . Arthritis    rheumatoid  . Atrial fibrillation (HCC)    hx of  . Chronic kidney disease   . Colon polyp 07-07-15   TUBULAR ADENOMA WITH AT LEAST HIGH-GRADE / Dr ReRayann Heman. Diabetes mellitus without complication ([place]  . Dysrhythmia    bradycardia.....Marland KitchenMarland Kitchento 1 heart block  . GERD (gastroesophageal reflux disease)   . Heart murmur    patient unaware of history of murmur  . Hypercholesterolemia   . Hypertension   . Presence of permanent cardiac pacemaker   . Prostate cancer ([place], 01/30/14   Gleason 3+4=7, volume 46.6 cc  . Rheumatoid arthritis ([place]  . S/P radiation therapy  04/03/2014 through 06/04/2014  Prostate 7800 cGy  in 40 sessions                          . Sleep apnea    uses cpap but it is not in the hospital with him    SURGICAL HISTORY: Past Surgical History:  Procedure Laterality Date  . ABDOMINAL AORTIC ANEURYSM REPAIR  11/2013  . COLON SURGERY  March 2017   Right hemicolectomy for tubulovillous adenoma with high-grade dysplasia.  . COLONOSCOPY WITH PROPOFOL N/A 07/07/2015   Procedure: COLONOSCOPY WITH PROPOFOL;  Surgeon: Matthew Gordon Rein, MD;  Location: [facility] END[facility]COPY;  Service: Endoscopy;  Laterality: N/A;  . COLONOSCOPY WITH PROPOFOL N/A 10/28/2016   Procedure: COLONOSCOPY WITH PROPOFOL;  Surgeon: Anna, Kiran, MD;  Location: [facility] END[facility];  Service: Endoscopy;  Laterality: N/A;  . ESOPHAGOGASTRODUODENOSCOPY (EGD) WITH PROPOFOL N/A 10/28/2016   Procedure: ESOPHAGOGASTRODUODENOSCOPY (EGD) WITH PROPOFOL;  Surgeon: Anna, Kiran, MD;  Location: [facility];  Service: Endoscopy;  Laterality: N/A;  . LAPAROSCOPIC RIGHT COLECTOMY Right 08/08/2015   Procedure: LAPAROSCOPIC RIGHT COLECTOMY;  Surgeon: Jeffrey W Byrnett, MD;  Location: [facility] ORS;  Service: General;  Laterality: Right;  . NASAL SINUS SURGERY    . PACEMAKER INSERTION Left 08/26/2016   Procedure: INSERTION PACEMAKER;  Surgeon: Alexander Paraschos, MD;  Location: [facility] ORS;  Service: Cardiovascular;  Laterality: Left;  . PROSTATE BIOPSY  01/01/13, 01/30/14   Gleason 3+3=6, vol 46.6 cc  . TONSILLECTOMY    . uvula surgery     for sleep apnea    SOCIAL HISTORY: Social History   Socioeconomic History  . Marital status: Divorced    Spouse name: Not on file  . Number of children: Not on file  . Years of education: Not on file  . Highest education level: Not on file  Social Needs  . Financial resource strain: Not on file  . Food insecurity - worry: Not on file  . Food insecurity - inability: Not on file  . Transportation needs - medical: Not on file  . Transportation needs - non-medical: Not on file  Occupational History  . Not on file   Tobacco Use  . Smoking status: Current Every Day Smoker    Packs/day: 0.50    Years: 50.00    Pack years: 25.00    Types: Cigarettes  . Smokeless tobacco: Never Used  . Tobacco comment: DOWN TO 1/2 PPD  Substance and Sexual Activity  . Alcohol use: Yes    Alcohol/week: 0.6 oz    Types: 1 Cans of beer per week    Comment: occasionally  . Drug use: No  . Sexual activity: Not on file  Other Topics Concern  . Not on file  Social History Narrative  . Not on file    FAMILY HISTORY: Family History  Problem Relation Age of Onset  . Heart attack Mother   . Cirrhosis Father   . Pancreatic cancer Brother   . Diabetes Brother   . Diabetes Daughter        medication induced for cancer treatments  . Cancer Daughter        breast, brain    ALLERGIES:  has No Known Allergies.  MEDICATIONS:  Current Outpatient Medications  Medication Sig Dispense Refill  . acyclovir (ZOVIRAX) 400 MG tablet Take 1 tablet (400 mg total) 2 (two) times daily by mouth. 60 tablet 3  . ALPRAZolam (XANAX) 0.25 MG tablet Take 0.25 mg by mouth 2 (two) times daily.    .   aspirin 81 MG chewable tablet Chew 81 mg by mouth daily.    . atorvastatin (LIPITOR) 80 MG tablet Take 80 mg by mouth at bedtime.     . busPIRone (BUSPAR) 5 MG tablet Take 5 mg by mouth 2 (two) times daily.     . Butalbital-APAP-Caffeine 50-300-40 MG CAPS Take 1 capsule by mouth every 6 (six) hours as needed (for migraines).     . cetirizine (ZYRTEC) 10 MG tablet Take 10 mg by mouth daily.    . dexamethasone (DECADRON) 4 MG tablet Take 10 tablets (40 mg) on days 1, 8, and 15 of chemo. Repeat every 21 days. Carepack 30 tablet 3  . fexofenadine (ALLEGRA) 180 MG tablet Take 180 mg by mouth daily.    . fluticasone (FLONASE) 50 MCG/ACT nasal spray Place 2 sprays into both nostrils daily as needed for rhinitis.     . Fluticasone Furoate-Vilanterol (BREO ELLIPTA) 100-25 MCG/INH AEPB Inhale 1 puff into the lungs daily.     . folic acid (FOLVITE) 1 MG  tablet Take 1 mg by mouth daily.    . isosorbide mononitrate (IMDUR) 30 MG 24 hr tablet Take 30 mg by mouth daily.    . L-METHIONINE PO Take by mouth.    . lenalidomide (REVLIMID) 25 MG capsule Take one capsule daily on days 1-14 every 21 days. Carepack 14 capsule 3  . metFORMIN (GLUCOPHAGE) 500 MG tablet Take 500 mg by mouth 2 (two) times daily with a meal.    . methotrexate (RHEUMATREX) 15 MG tablet Take 15 mg by mouth once a week. Caution: Chemotherapy. Protect from light.    . ondansetron (ZOFRAN) 8 MG tablet Take 1 tablet (8 mg total) 2 (two) times daily as needed by mouth (Nausea or vomiting). 30 tablet 1  . pantoprazole (PROTONIX) 40 MG tablet Take 1 tablet (40 mg total) by mouth 2 (two) times daily. 60 tablet 3  . triamcinolone cream (KENALOG) 0.1 % Apply 1 application topically 2 (two) times daily.     No current facility-administered medications for this visit.       .  PHYSICAL EXAMINATION: ECOG PERFORMANCE STATUS: 1 - Symptomatic but completely ambulatory Vitals:   05/02/17 1131  BP: 110/65  Pulse: 69  Temp: (!) 95.4 F (35.2 C)   Filed Weights   05/02/17 1131  Weight: 202 lb 7 oz (91.8 kg)    Physical Exam  Constitutional: He is well-developed, well-nourished, and in no distress. No distress.  HENT:  Head: Normocephalic and atraumatic.  Eyes: EOM are normal. Pupils are equal, round, and reactive to light.  Neck: Normal range of motion. Neck supple.  Cardiovascular: Normal rate, regular rhythm and normal heart sounds.  No murmur heard. Pulmonary/Chest: Effort normal. No respiratory distress.  Abdominal: Soft. Bowel sounds are normal. He exhibits no distension.  Musculoskeletal: Normal range of motion. He exhibits no edema.  Lymphadenopathy:    He has no cervical adenopathy.  Neurological: He is alert.  Skin: Skin is warm and dry.  Psychiatric: Affect and judgment normal.   LABORATORY DATA:  I have reviewed the data as listed Lab Results  Component Value  Date   WBC 8.3 04/06/2017   HGB 10.3 (L) 04/06/2017   HCT 33.8 (L) 04/06/2017   MCV 75.6 (L) 04/06/2017   PLT 342 04/06/2017   Recent Labs    08/26/16 0358 08/27/16 0450 08/27/16 1353 03/08/17 1055  NA 136 135  --  136  K 4.4 4.6  --  3.7  CL   108 104  --  106  CO2 22 23  --  20*  GLUCOSE 104* 105*  --  108*  BUN 23* 24*  --  14  CREATININE 1.48* 1.55* 1.62* 1.19  CALCIUM 8.6* 8.6*  --  9.0  GFRNONAA 45* 42* 40* 58*  GFRAA 52* 49* 47* >60  PROT  --   --   --  8.0  ALBUMIN  --   --   --  3.8  AST  --   --   --  20  ALT  --   --   --  16*  ALKPHOS  --   --   --  66  BILITOT  --   --   --  0.5    SPEP: no M spike Serum free light chain Kappa/Lamda ration 13.04 UPEP  Free light chain Kappa/lamda ratio 162.43, M spike 198m/24 hour   Bone marrow biopsy 03/21/2017  Bone Marrow, Aspirate,Biopsy, and Clot, left iliac - HYPERCELLULAR BONE MARROW FOR AGE WITH TRILINEAGE HEMATOPOIESIS. - PLASMACYTOSIS (PLASMA CELLS 8%)  Karyotype: loss of Y chromosome Cytogenetic MDS FISH Panel negative.   Bone marrow 04/06/2017  Bone Marrow, Aspirate,Biopsy, and Clot, right iliac and core - HYPERCELLULAR BONE MARROW FOR AGE WITH PLASMA CELL NEOPLASM - TRILINEAGE HEMATOPOIESIS. - SEE COMMENT. PERIPHERAL BLOOD: - MICROCYTIC-HYPOCHROMIC ANEMIA. Diagnosis Note The bone marrow is hypercellular with trilineage hematopoiesis but with relative abundance of erythroid precursors and increased number of megakaryocytes with nonspecific changes. Significant dyspoiesis is not seen. Iron stores are present with no ring sideroblasts. The plasma cells are increased in number representing 8% of all cells in the aspirate although focal areas in the core biopsy show 10 to 20% as primarily seen by CD138 stain. In situ hybridization for kappa and lambda light chains show kappa light chain restriction consistent with plasma cell neoplasm. Correlation with cytogenetic and FISH studies is recommended.  (BNS:ecj/gt 04/07/2017)  IMAGE STUDIES I have personally reviewed below image results.  03/15/2017 DG bone survey Met: Nonspecific lucent lesion in the distal right ulna measuring 15-16 mm, but otherwise normal bone mineralization for age throughout the visible skeleton. A solitary lytic lesion in a distal extremity would be an unusual presentation of multiple myeloma, and I favor the distal right ulna lesion is benign. Recommend correlation with serum and urine protein electrophoresis.  04/14/2017 PET scan: 1. 2.1 cm hypermetabolic soft tissue lesion in the left heel, adjacent to the calcaneal tuberosity. 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 2. Mottled FDG accumulation diffusely in the marrow space without other frankly overt hypermetabolic bony lesion. 3. Coronary artery and thoracoabdominal aortic atherosclerosis with abdominal aortic stent graft visualized in situ. 4. Bilateral renal cysts and bilateral nonobstructing renal stones.    ASSESSMENT & PLAN:  1. Multiple myeloma, remission status unspecified ([place]   2. Goals of care, counseling/discussion   3. Iron deficiency anemia, unspecified iron deficiency anemia type   # s/p IV Venofer x 4 doses. Hemoglobin improved.  #Discussed with patient about bone marrow biopsy results as well as image results. He has active multiple myeloma, standard risk.  Plan RVD regimen x 4 cycles. He will meet pharmacist today to discuss side effects profile. I have discussed with patient regarding side effects of RVD including but not limited to infection, blood clots, neuropathy, secondary malignancy.  Patient voices understanding and is willing to proceed. We will schedule him to see me on the first day of his treatment cycle and also will get Velcade shot.  He is taking Aspirin 26m daily. Will also start him on acyclovir.  Labs today showed that his eGFR is around 55, plan revlimid dose reduction to 155mdaily.  All questions were answered. The  patient knows to call the clinic with any problems questions or concerns.  Return of visit: after he received chemotherapy drugs.    ZhEarlie ServerMD, PhD Hematology Oncology [facility] Tampaager- [phone]/26/2018

## 2017-05-02 NOTE — Progress Notes (Signed)
START ON PATHWAY REGIMEN - Multiple Myeloma and Other Plasma Cell Dyscrasias     A cycle is every 21 days:     Bortezomib      Lenalidomide      Dexamethasone   **Always confirm dose/schedule in your pharmacy ordering system**  Patient Characteristics: Newly Diagnosed, Transplant Eligible, Standard Risk R-ISS Staging: I Disease Classification: Newly Diagnosed Is Patient Eligible for Transplant<= Transplant Eligible Risk Status: Standard Risk Intent of Therapy: Curative Intent, Discussed with Patient 

## 2017-05-02 NOTE — Addendum Note (Signed)
Addended by: Earlie Server on: 05/02/2017 10:44 PM   Modules accepted: Orders

## 2017-05-02 NOTE — Progress Notes (Signed)
cbc

## 2017-05-03 ENCOUNTER — Telehealth: Payer: Self-pay | Admitting: Pharmacist

## 2017-05-03 ENCOUNTER — Telehealth: Payer: Self-pay | Admitting: *Deleted

## 2017-05-03 ENCOUNTER — Other Ambulatory Visit: Payer: Self-pay

## 2017-05-03 ENCOUNTER — Inpatient Hospital Stay: Payer: Medicare Other

## 2017-05-03 ENCOUNTER — Other Ambulatory Visit: Payer: Self-pay | Admitting: Oncology

## 2017-05-03 ENCOUNTER — Other Ambulatory Visit: Payer: Self-pay | Admitting: Physician Assistant

## 2017-05-03 ENCOUNTER — Telehealth: Payer: Self-pay | Admitting: Oncology

## 2017-05-03 DIAGNOSIS — C9000 Multiple myeloma not having achieved remission: Secondary | ICD-10-CM

## 2017-05-03 DIAGNOSIS — D509 Iron deficiency anemia, unspecified: Secondary | ICD-10-CM

## 2017-05-03 LAB — IRON AND TIBC
IRON: 21 ug/dL — AB (ref 45–182)
Saturation Ratios: 6 % — ABNORMAL LOW (ref 17.9–39.5)
TIBC: 364 ug/dL (ref 250–450)
UIBC: 343 ug/dL

## 2017-05-03 LAB — FERRITIN: FERRITIN: 22 ng/mL — AB (ref 24–336)

## 2017-05-03 MED ORDER — LENALIDOMIDE 10 MG PO CAPS: 10.0000 mg | ORAL_CAPSULE | Freq: Every day | ORAL | 0 refills | Status: DC

## 2017-05-03 NOTE — Telephone Encounter (Addendum)
Oral Oncology Patient Advocate Encounter  Received notification from [facility] that prior authorization for Revlimid is required.  Did paper PA. Status is pending  Oral Oncology Clinic will continue to follow.   [place] Patient Advocate [phone] 05/03/2017 8:18 AM

## 2017-05-03 NOTE — Telephone Encounter (Signed)
Oral Chemotherapy Pharmacist Encounter  Due to an increase in SCr, Mr. Bramel dose of Revlimid was decreased from 25mg  to 10mg .   New prescription was escribed to [facility]  Revlimid 10mg  REMS Auth #: [id number]  Darl Pikes, PharmD, BCPS Hematology/Oncology Clinical Pharmacist [facility] [place] [facility] [phone]  05/03/2017 3:59 PM

## 2017-05-03 NOTE — Telephone Encounter (Signed)
Scheduling dept called stating that pt reported that he takes Plavix.  Called patient to confirm as this is not listed on his medication list, Patient confirmed that he is taking Plavix 75mg  once daily.  Added this medication to his current medication list and notified scheduling that he is taking Plavix, therefore they will need to cancel tomorrows scheduled biopsy, reschedule so the patient can hold medication as needed for the biopsy.

## 2017-05-03 NOTE — Addendum Note (Signed)
Addended by: Earlie Server on: 05/03/2017 08:29 AM   Modules accepted: Orders

## 2017-05-03 NOTE — Telephone Encounter (Signed)
Oral Oncology Patient Advocate Encounter  Prior Authorization for Revlimid has been approved.    PA# [id number] Effective dates: 05/02/2017 through 06/06/2018  Oral Oncology Clinic will continue to follow.    [place] Patient Advocate [phone] 05/03/2017 1:05 PM

## 2017-05-04 ENCOUNTER — Ambulatory Visit: Payer: Medicare Other

## 2017-05-04 ENCOUNTER — Ambulatory Visit: Admission: RE | Admit: 2017-05-04 | Payer: Medicare Other | Source: Ambulatory Visit

## 2017-05-05 NOTE — Telephone Encounter (Addendum)
Oral Chemotherapy Pharmacist Encounter  Spoke with [facility]. Omar [name] will have his medications, Revlimid and dexamethasone, delivered tomorrow 11/30. He knows not to start until he comes in for his first Velcade injection.   He was enrolled in [place] assistance to cover his Revlimid $100 copay.  [employer]: $10,000 Cardholder ID [id number] BIN [id number] PCN [id number] Group Number [id number]  His copay is $11.25 for his dexamethasone  Darl Pikes, PharmD, BCPS Hematology/Oncology Clinical Pharmacist [facility] [place] [facility] [phone]  05/05/2017 2:28 PM

## 2017-05-05 NOTE — Telephone Encounter (Signed)
Oral Chemotherapy Pharmacist Encounter  Spoke with Dr. Tasia Catchings and she would like for Mr. Genova to stop taking with methotrexate. Instructed Mr. Winterton to stop his methotrexate. He last took his methotrexate last Friday 11/23. He stated his understanding and knows not to continue his methotrexate.  Also reinforced with him not taking his Revlimid until he is notified his Velcade injects are scheduled.    Darl Pikes, PharmD, BCPS Hematology/Oncology Clinical Pharmacist [facility] [place] [facility] [phone]  05/05/2017 5:08 PM

## 2017-05-09 ENCOUNTER — Ambulatory Visit: Payer: Self-pay | Admitting: Oncology

## 2017-05-10 ENCOUNTER — Other Ambulatory Visit: Payer: Self-pay | Admitting: *Deleted

## 2017-05-10 ENCOUNTER — Other Ambulatory Visit: Payer: Self-pay | Admitting: Oncology

## 2017-05-10 DIAGNOSIS — C61 Malignant neoplasm of prostate: Secondary | ICD-10-CM

## 2017-05-10 DIAGNOSIS — C9000 Multiple myeloma not having achieved remission: Secondary | ICD-10-CM

## 2017-05-10 NOTE — Progress Notes (Addendum)
Hematology/Oncology Follow Up Note [facility] [phone] [phone]  Patient Care Team: Jodi Marble, MD as PCP - General (Internal Medicine) Bary Castilla, Forest Gleason, MD (General Surgery) Edrick Kins, MD as Rounding Team (Internal Medicine) Hillary Bow, MD as Consulting Physician (Internal Medicine) [name], MD as Surgeon (Gastroenterology) Isaias Cowman, MD as Consulting Physician (Cardiology)  REFERRING PROVIDER: Dr.Lateef, [place] CHIEF COMPLAINTS/REASON FOR VISIT  Abnormal UPEP results, follow up for evaluation of monoclonal gammopathy  HISTORY OF PRESENTING ILLNESS:  76 y.o.  Omar [name] with PMH listed below who presents to follow up on the evaluation and management of his abnormal urine protein electrophoresis results. I reviewed the records from [facility], [place] [facility] [place] was performed and HemOnc related medical problems are listed below.  He lives with a friend. He has 3 adult children.   1 Chronic Kidney disease, Stage II: patient follows up with Dr.Lateef.  2 Proteinuria:  02/18/2017: Albumin/Creatinine ratio was 7, Urine protein electrophoresis,Random Urine revealed M Spike Omar.2%, total protein of Omar.29m/dl,  01/05/2017 Albumin/Creatinine ratio was 455.2, Albumin 996.5, , 2.15.2018 Albumin/Creatinine ratio was 80.3, Albumin 73.3, Urine protein electrophoresis,Random Urine revealed M Spike 29.7%, total protein of 29.176mdl, 06/30/2016 Serum protein electrophoresis did not detect M spike.  Autoimmune disorder work up showed Negative anti dsDNA, RNP antibodies, smith antibody, Sjogren antibodies, anti Jo-1, positive antichromotin antibodies, positive ANA,  3 Anemia of chronic kidney disease:  4 Iron deficiency anemia: history of iron deficiency, ferritin was 7 on 08/11/2016. He had EGD and colonoscopy that were done this year which showed  duodenitis/esphagitis/diverticulosis/benigh polyps. 5 Rheumatoid arthritis: he follows up with Dr.Kernodle and is on chronic MTX. He reports joint pains are controlled, not quite symptomatic.   Patient denies any persistent bone pain or any pain. He continue to feel lack of energy, persistent, not improved with resting or taking naps. He also has lost 25 pounds in the past year which was unintentional.   INTERVAL HISTORY Patient presents for follow up for management of multiple myeloma. His biopsy was delayed as he initially did not disclose that he is on plavix. .Deneis any pain or discomfort today. He has received revlimid and dexamethasone. He brought the medication to clinic today.    Review of Systems  Constitutional: Positive for fatigue. Negative for appetite change.  HENT:   Negative for hearing loss.   Eyes: Negative for eye problems.  Respiratory: Negative for chest tightness.   Cardiovascular: Negative for chest pain.  Endocrine: Negative for hot flashes.  Genitourinary: Negative for difficulty urinating.   Musculoskeletal: Negative for arthralgias and gait problem.  Skin: Negative for itching.  Neurological: Negative for dizziness and gait problem.  Hematological: Negative for adenopathy.  Psychiatric/Behavioral: The patient is not nervous/anxious.      MEDICAL HISTORY:  Past Medical History:  Diagnosis Date  . Anxiety   . Arthritis    rheumatoid  . Atrial fibrillation (HCC)    hx of  . Chronic kidney disease   . Colon polyp 07-07-15   TUBULAR ADENOMA WITH AT LEAST HIGH-GRADE / Dr ReRayann Heman. Diabetes mellitus without complication ([facility]  . Dysrhythmia    bradycardia.....Marland KitchenMarland Kitchento 1 heart block  . GERD (gastroesophageal reflux disease)   . Heart murmur    patient unaware of history of murmur  . Hypercholesterolemia   . Hypertension   . Presence of permanent cardiac pacemaker   . [place]   Gleason 3+4=7, volume  46.6 cc  . Rheumatoid  arthritis ([place])   . S/P radiation therapy  04/03/2014 through 06/04/2014                                                      Prostate 7800 cGy in 40 sessions                          . Sleep apnea    uses cpap but it is not in the hospital with him    SURGICAL HISTORY: Past Surgical History:  Procedure Laterality Date  . ABDOMINAL AORTIC ANEURYSM REPAIR  11/2013  . COLON SURGERY  March 2017   Right hemicolectomy for tubulovillous adenoma with high-grade dysplasia.  . COLONOSCOPY WITH PROPOFOL N/A 07/07/2015   Procedure: COLONOSCOPY WITH PROPOFOL;  Surgeon: Josefine Class, MD;  Location: [facility] ENDOSCOPY;  Service: Endoscopy;  Laterality: N/A;  . COLONOSCOPY WITH PROPOFOL N/A 10/28/2016   Procedure: COLONOSCOPY WITH PROPOFOL;  Surgeon: [name], MD;  Location: [facility] ENDOSCOPY;  Service: Endoscopy;  Laterality: N/A;  . ESOPHAGOGASTRODUODENOSCOPY (EGD) WITH PROPOFOL N/A 10/28/2016   Procedure: ESOPHAGOGASTRODUODENOSCOPY (EGD) WITH PROPOFOL;  Surgeon: [name], MD;  Location: [facility] ENDOSCOPY;  Service: Endoscopy;  Laterality: N/A;  . LAPAROSCOPIC RIGHT COLECTOMY Right 08/08/2015   Procedure: LAPAROSCOPIC RIGHT COLECTOMY;  Surgeon: Robert Bellow, MD;  Location: [facility] ORS;  Service: General;  Laterality: Right;  . NASAL SINUS SURGERY    . PACEMAKER INSERTION Left 08/26/2016   Procedure: INSERTION PACEMAKER;  Surgeon: Isaias Cowman, MD;  Location: [facility] ORS;  Service: Cardiovascular;  Laterality: Left;  . PROSTATE BIOPSY  01/01/13, 01/30/14   Gleason 3+3=6, vol 46.6 cc  . TONSILLECTOMY    . uvula surgery     for sleep apnea    SOCIAL HISTORY: Social History   Socioeconomic History  . Marital status: Divorced    Spouse name: Not on file  . Number of children: Not on file  . Years of education: Not on file  . Highest education level: Not on file  Social Needs  . Financial resource strain: Not on file  . Food insecurity - worry: Not on file  . Food insecurity - inability: Not  on file  . Transportation needs - medical: Not on file  . Transportation needs - non-medical: Not on file  Occupational History  . Not on file  Tobacco Use  . Smoking status: Current Every Day Smoker    Packs/day: 0.50    Years: 50.00    Pack years: 25.00    Types: Cigarettes  . Smokeless tobacco: Never Used  . Tobacco comment: DOWN TO 1/2 PPD  Substance and Sexual Activity  . Alcohol use: Yes    Alcohol/week: 0.6 oz    Types: 1 Cans of beer per week    Comment: occasionally  . Drug use: No  . Sexual activity: Not on file  Other Topics Concern  . Not on file  Social History Narrative  . Not on file    FAMILY HISTORY: Family History  Problem Relation Age of Onset  . Heart attack Mother   . Cirrhosis Father   . Pancreatic cancer Brother   . Diabetes Brother   . Diabetes Daughter        medication induced for cancer treatments  . Cancer  Daughter        breast, brain    ALLERGIES:  has No Known Allergies.  MEDICATIONS:  Current Outpatient Medications  Medication Sig Dispense Refill  . acyclovir (ZOVIRAX) 400 MG tablet Take 1 tablet (400 mg total) 2 (two) times daily by mouth. (Patient not taking: Reported on 05/02/2017) 60 tablet 3  . ALPRAZolam (XANAX) 0.25 MG tablet Take 0.25 mg by mouth 2 (two) times daily.    Marland Kitchen aspirin 81 MG chewable tablet Chew 81 mg by mouth daily.    Marland Kitchen atorvastatin (LIPITOR) 80 MG tablet Take 80 mg by mouth at bedtime.     . busPIRone (BUSPAR) 5 MG tablet Take 5 mg by mouth 2 (two) times daily.     . Butalbital-APAP-Caffeine 50-300-40 MG CAPS Take 1 capsule by mouth every 6 (six) hours as needed (for migraines).     . clopidogrel (PLAVIX) 75 MG tablet Take 75 mg by mouth daily.    Marland Kitchen dexamethasone (DECADRON) 4 MG tablet Take 10 tablets (40 mg) on days 1, 8, and 15 of chemo. Repeat every 21 days. Carepack (Patient not taking: Reported on 05/02/2017) 30 tablet 3  . fexofenadine (ALLEGRA) 180 MG tablet Take 180 mg by mouth daily.    . fluticasone  (FLONASE) 50 MCG/ACT nasal spray Place 2 sprays into both nostrils daily as needed for rhinitis.     . Fluticasone Furoate-Vilanterol (BREO ELLIPTA) 100-25 MCG/INH AEPB Inhale 1 puff into the lungs daily.     . folic acid (FOLVITE) 1 MG tablet Take 1 mg by mouth daily.    . isosorbide mononitrate (IMDUR) 30 MG 24 hr tablet Take 30 mg by mouth daily.    . L-METHIONINE PO Take by mouth.    . lenalidomide (REVLIMID) 10 MG capsule Take 1 capsule (10 mg total) by mouth daily. Take one capsule daily on days 1-14 every 21 days. 14 capsule 0  . metFORMIN (GLUCOPHAGE) 500 MG tablet Take 500 mg by mouth 2 (two) times daily with a meal.    . methotrexate (RHEUMATREX) 15 MG tablet Take 15 mg by mouth once a week. Caution: Chemotherapy. Protect from light.    . ondansetron (ZOFRAN) 8 MG tablet Take 1 tablet (8 mg total) 2 (two) times daily as needed by mouth (Nausea or vomiting). (Patient not taking: Reported on 05/02/2017) 30 tablet 1  . pantoprazole (PROTONIX) 40 MG tablet Take 1 tablet (40 mg total) by mouth 2 (two) times daily. 60 tablet 3  . triamcinolone cream (KENALOG) 0.1 % Apply 1 application topically 2 (two) times daily.     No current facility-administered medications for this visit.       Marland Kitchen  PHYSICAL EXAMINATION: ECOG PERFORMANCE STATUS: 1 - Symptomatic but completely ambulatory Vitals:   05/11/17 0846  BP: (!) 142/89  Pulse: 84  Resp: 16  Temp: (!) 97.2 F (36.2 C)   Filed Weights   05/11/17 0846  Weight: 204 lb (92.5 kg)   Physical Exam  Constitutional: He is oriented to person, place, and time and well-developed, well-nourished, and in no distress. No distress.  HENT:  Head: Normocephalic and atraumatic.  Eyes: EOM are normal. Pupils are equal, round, and reactive to light.  Neck: Normal range of motion. Neck supple.  Cardiovascular: Normal rate, regular rhythm and normal heart sounds.  Pulmonary/Chest: Effort normal and breath sounds normal.  Abdominal: Soft. Bowel sounds  are normal. He exhibits no distension.  Musculoskeletal: Normal range of motion. He exhibits no edema.  Lymphadenopathy:  He has no cervical adenopathy.  Neurological: He is alert and oriented to person, place, and time.  Skin: Skin is dry.  Psychiatric: Affect normal.    LABORATORY DATA:  I have reviewed the data as listed Lab Results  Component Value Date   WBC 9.7 05/02/2017   HGB 11.3 (L) 05/02/2017   HCT 36.6 (L) 05/02/2017   MCV 75.3 (L) 05/02/2017   PLT 290 05/02/2017   Recent Labs    08/27/16 0450 08/27/16 1353 03/08/17 1055 05/02/17 1234  NA 135  --  136 138  K 4.6  --  3.7 4.5  CL 104  --  106 109  CO2 23  --  20* 20*  GLUCOSE 105*  --  108* 98  BUN 24*  --  14 14  CREATININE 1.55* 1.62* 1.19 1.25*  CALCIUM 8.6*  --  9.0 9.1  GFRNONAA 42* 40* 58* 55*  GFRAA 49* 47* >60 >60  PROT  --   --  8.0 7.9  ALBUMIN  --   --  3.8 4.0  AST  --   --  20 16  ALT  --   --  16* 15*  ALKPHOS  --   --  66 66  BILITOT  --   --  0.5 0.6    SPEP: no M spike Serum free light chain Kappa/Lamda ration 13.04 UPEP  Free light chain Kappa/lamda ratio 162.Omar, M spike 118m/24 hour   Bone marrow biopsy 03/21/2017  Bone Marrow, Aspirate,Biopsy, and Clot, left iliac - HYPERCELLULAR BONE MARROW FOR AGE WITH TRILINEAGE HEMATOPOIESIS. - PLASMACYTOSIS (PLASMA CELLS 8%)  Karyotype: loss of Y chromosome Cytogenetic MDS FISH Panel negative.   Bone marrow 04/06/2017  Bone Marrow, Aspirate,Biopsy, and Clot, right iliac and core - HYPERCELLULAR BONE MARROW FOR AGE WITH PLASMA CELL NEOPLASM - TRILINEAGE HEMATOPOIESIS. - SEE COMMENT. PERIPHERAL BLOOD: - MICROCYTIC-HYPOCHROMIC ANEMIA. Diagnosis Note The bone marrow is hypercellular with trilineage hematopoiesis but with relative abundance of erythroid precursors and increased number of megakaryocytes with nonspecific changes. Significant dyspoiesis is not seen. Iron stores are present with no ring sideroblasts. The plasma cells are  increased in number representing 8% of all cells in the aspirate although focal areas in the core biopsy show 10 to 20% as primarily seen by CD138 stain. In situ hybridization for kappa and lambda light chains show kappa light chain restriction consistent with plasma cell neoplasm. Correlation with cytogenetic and FISH studies is recommended. (BNS:ecj/gt 04/07/2017)  IMAGE STUDIES I have personally reviewed below image results.  03/15/2017 DG bone survey Met: Nonspecific lucent lesion in the distal right ulna measuring 15-16 mm, but otherwise normal bone mineralization for age throughout the visible skeleton. A solitary lytic lesion in a distal extremity would be an unusual presentation of multiple myeloma, and I favor the distal right ulna lesion is benign. Recommend correlation with serum and urine protein electrophoresis.  04/14/2017 PET scan: 1. 2.1 cm hypermetabolic soft tissue lesion in the left heel, adjacent to the calcaneal tuberosity. 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 2. Mottled FDG accumulation diffusely in the marrow space without other frankly overt hypermetabolic bony lesion. 3. Coronary artery and thoracoabdominal aortic atherosclerosis with abdominal aortic stent graft visualized in situ. 4. Bilateral renal cysts and bilateral nonobstructing renal stones.    ASSESSMENT & PLAN:  1. Multiple myeloma, remission status unspecified ([place]   2. Iron deficiency anemia, unspecified iron deficiency anemia type   3. CKD (chronic kidney disease) stage 2, GFR 60-89 ml/min   #  s/p IV Venofer x 4 doses. Hemoglobin improved.  # Since biopsy of left heel of hypermetabolic soft tissue lesion was delayed as patient was on Plavix, will start treatment rather than waiting for thi procedure to be done.  cancel the biopsy and start treatment.  Standard risk multiple myeloma.  Revlimid renal dosing of 9m, titrate if no hematological toxicities.  Will start with Q21d cycles. In the future,  consider to use Q28 d cycle.   Revlimid 10 mg PO once per day on days 1 to 14 (dose reduced due to GFR) Velcade 1.3 mg/m2 IV once per day on days 1,  8,  Dexamethasone 40 mg PO once per day on days 1, 8, 15,   # advise patient to continue Aspirin 840mand Plavix  Also start acyclovir for prophylaxis.   He is taking Aspirin 8153maily. Will also start him on acyclovir.  Labs today showed that his eGFR is around 55, plan revlimid dose reduction to 44m51mily.  All questions were answered. The patient knows to call the clinic with any problems questions or concerns.  Return of visit: 1 week to evaluate toxicity.    ZhouEarlie Server, PhD Hematology Oncology [facility]- [id number]

## 2017-05-11 ENCOUNTER — Inpatient Hospital Stay: Payer: Medicare Other

## 2017-05-11 ENCOUNTER — Encounter: Payer: Self-pay | Admitting: Oncology

## 2017-05-11 ENCOUNTER — Inpatient Hospital Stay: Payer: Medicare Other | Attending: Oncology | Admitting: Oncology

## 2017-05-11 VITALS — BP 142/89 | HR 84 | Temp 97.2°F | Resp 16 | Wt 204.0 lb

## 2017-05-11 DIAGNOSIS — Z79899 Other long term (current) drug therapy: Secondary | ICD-10-CM | POA: Diagnosis not present

## 2017-05-11 DIAGNOSIS — M25572 Pain in left ankle and joints of left foot: Secondary | ICD-10-CM | POA: Insufficient documentation

## 2017-05-11 DIAGNOSIS — D631 Anemia in chronic kidney disease: Secondary | ICD-10-CM | POA: Insufficient documentation

## 2017-05-11 DIAGNOSIS — R197 Diarrhea, unspecified: Secondary | ICD-10-CM | POA: Diagnosis not present

## 2017-05-11 DIAGNOSIS — D509 Iron deficiency anemia, unspecified: Secondary | ICD-10-CM | POA: Diagnosis not present

## 2017-05-11 DIAGNOSIS — I129 Hypertensive chronic kidney disease with stage 1 through stage 4 chronic kidney disease, or unspecified chronic kidney disease: Secondary | ICD-10-CM | POA: Diagnosis not present

## 2017-05-11 DIAGNOSIS — M069 Rheumatoid arthritis, unspecified: Secondary | ICD-10-CM | POA: Insufficient documentation

## 2017-05-11 DIAGNOSIS — K2980 Duodenitis without bleeding: Secondary | ICD-10-CM | POA: Diagnosis not present

## 2017-05-11 DIAGNOSIS — C9000 Multiple myeloma not having achieved remission: Secondary | ICD-10-CM

## 2017-05-11 DIAGNOSIS — F17210 Nicotine dependence, cigarettes, uncomplicated: Secondary | ICD-10-CM | POA: Diagnosis not present

## 2017-05-11 DIAGNOSIS — M25571 Pain in right ankle and joints of right foot: Secondary | ICD-10-CM | POA: Insufficient documentation

## 2017-05-11 DIAGNOSIS — E860 Dehydration: Secondary | ICD-10-CM | POA: Insufficient documentation

## 2017-05-11 DIAGNOSIS — Z7984 Long term (current) use of oral hypoglycemic drugs: Secondary | ICD-10-CM | POA: Diagnosis not present

## 2017-05-11 DIAGNOSIS — Z950 Presence of cardiac pacemaker: Secondary | ICD-10-CM | POA: Insufficient documentation

## 2017-05-11 DIAGNOSIS — K219 Gastro-esophageal reflux disease without esophagitis: Secondary | ICD-10-CM | POA: Insufficient documentation

## 2017-05-11 DIAGNOSIS — Z86010 Personal history of colonic polyps: Secondary | ICD-10-CM | POA: Insufficient documentation

## 2017-05-11 DIAGNOSIS — F419 Anxiety disorder, unspecified: Secondary | ICD-10-CM | POA: Insufficient documentation

## 2017-05-11 DIAGNOSIS — I4891 Unspecified atrial fibrillation: Secondary | ICD-10-CM | POA: Diagnosis not present

## 2017-05-11 DIAGNOSIS — Z8546 Personal history of malignant neoplasm of prostate: Secondary | ICD-10-CM | POA: Diagnosis not present

## 2017-05-11 DIAGNOSIS — E7800 Pure hypercholesterolemia, unspecified: Secondary | ICD-10-CM | POA: Diagnosis not present

## 2017-05-11 DIAGNOSIS — N182 Chronic kidney disease, stage 2 (mild): Secondary | ICD-10-CM | POA: Diagnosis not present

## 2017-05-11 DIAGNOSIS — Z5111 Encounter for antineoplastic chemotherapy: Secondary | ICD-10-CM | POA: Insufficient documentation

## 2017-05-11 DIAGNOSIS — C61 Malignant neoplasm of prostate: Secondary | ICD-10-CM

## 2017-05-11 DIAGNOSIS — E1122 Type 2 diabetes mellitus with diabetic chronic kidney disease: Secondary | ICD-10-CM | POA: Diagnosis not present

## 2017-05-11 DIAGNOSIS — Z7982 Long term (current) use of aspirin: Secondary | ICD-10-CM | POA: Diagnosis not present

## 2017-05-11 DIAGNOSIS — Z7902 Long term (current) use of antithrombotics/antiplatelets: Secondary | ICD-10-CM | POA: Insufficient documentation

## 2017-05-11 LAB — CBC WITH DIFFERENTIAL/PLATELET
BASOS ABS: 0.1 10*3/uL (ref 0–0.1)
Basophils Relative: 1 %
EOS PCT: 3 %
Eosinophils Absolute: 0.3 10*3/uL (ref 0–0.7)
HCT: 37.9 % — ABNORMAL LOW (ref 40.0–52.0)
Hemoglobin: 11.9 g/dL — ABNORMAL LOW (ref 13.0–18.0)
LYMPHS PCT: 13 %
Lymphs Abs: 1.1 10*3/uL (ref 1.0–3.6)
MCH: 23.8 pg — ABNORMAL LOW (ref 26.0–34.0)
MCHC: 31.6 g/dL — ABNORMAL LOW (ref 32.0–36.0)
MCV: 75.3 fL — AB (ref 80.0–100.0)
Monocytes Absolute: 0.5 10*3/uL (ref 0.2–1.0)
Monocytes Relative: 6 %
NEUTROS PCT: 77 %
Neutro Abs: 7 10*3/uL — ABNORMAL HIGH (ref 1.4–6.5)
PLATELETS: 381 10*3/uL (ref 150–440)
RBC: 5.03 MIL/uL (ref 4.40–5.90)
RDW: 29.3 % — ABNORMAL HIGH (ref 11.5–14.5)
WBC: 9 10*3/uL (ref 3.8–10.6)

## 2017-05-11 LAB — COMPREHENSIVE METABOLIC PANEL
ALT: 14 U/L — ABNORMAL LOW (ref 17–63)
ANION GAP: 8 (ref 5–15)
AST: 19 U/L (ref 15–41)
Albumin: 3.8 g/dL (ref 3.5–5.0)
Alkaline Phosphatase: 80 U/L (ref 38–126)
BILIRUBIN TOTAL: 0.4 mg/dL (ref 0.3–1.2)
BUN: 17 mg/dL (ref 6–20)
CO2: 23 mmol/L (ref 22–32)
Calcium: 9.1 mg/dL (ref 8.9–10.3)
Chloride: 108 mmol/L (ref 101–111)
Creatinine, Ser: 1.33 mg/dL — ABNORMAL HIGH (ref 0.61–1.24)
GFR, EST AFRICAN AMERICAN: 59 mL/min — AB (ref >60)
GFR, EST NON AFRICAN AMERICAN: 51 mL/min — AB (ref >60)
Glucose, Bld: 130 mg/dL — ABNORMAL HIGH (ref 65–99)
POTASSIUM: 4.3 mmol/L (ref 3.5–5.1)
Sodium: 139 mmol/L (ref 135–145)
TOTAL PROTEIN: 7.6 g/dL (ref 6.5–8.1)

## 2017-05-11 LAB — LACTATE DEHYDROGENASE: LDH: 148 U/L (ref 98–192)

## 2017-05-11 MED ORDER — BORTEZOMIB CHEMO SQ INJECTION 3.5 MG (2.5MG/ML)
1.3000 mg/m2 | Freq: Once | INTRAMUSCULAR | Status: AC
  Administered 2017-05-11: 2.75 mg via SUBCUTANEOUS
  Filled 2017-05-11: qty 2.75

## 2017-05-11 MED ORDER — PROCHLORPERAZINE MALEATE 10 MG PO TABS
10.0000 mg | ORAL_TABLET | Freq: Once | ORAL | Status: AC
  Administered 2017-05-11: 10 mg via ORAL
  Filled 2017-05-11: qty 1

## 2017-05-11 NOTE — Progress Notes (Signed)
Patient here for follow up with labs. He states that he is feeling well and denies having any pain.

## 2017-05-12 LAB — BETA 2 MICROGLOBULIN, SERUM: BETA 2 MICROGLOBULIN: 2.4 mg/L (ref 0.6–2.4)

## 2017-05-18 ENCOUNTER — Encounter: Payer: Self-pay | Admitting: Oncology

## 2017-05-18 ENCOUNTER — Inpatient Hospital Stay: Payer: Medicare Other

## 2017-05-18 ENCOUNTER — Other Ambulatory Visit: Payer: Self-pay

## 2017-05-18 ENCOUNTER — Inpatient Hospital Stay: Payer: Medicare Other | Admitting: Oncology

## 2017-05-18 VITALS — BP 99/63 | HR 66 | Temp 98.4°F | Wt 208.2 lb

## 2017-05-18 DIAGNOSIS — Z79899 Other long term (current) drug therapy: Secondary | ICD-10-CM

## 2017-05-18 DIAGNOSIS — C9000 Multiple myeloma not having achieved remission: Secondary | ICD-10-CM

## 2017-05-18 DIAGNOSIS — N182 Chronic kidney disease, stage 2 (mild): Secondary | ICD-10-CM | POA: Diagnosis not present

## 2017-05-18 DIAGNOSIS — M25572 Pain in left ankle and joints of left foot: Secondary | ICD-10-CM | POA: Diagnosis not present

## 2017-05-18 DIAGNOSIS — D509 Iron deficiency anemia, unspecified: Secondary | ICD-10-CM

## 2017-05-18 DIAGNOSIS — Z8546 Personal history of malignant neoplasm of prostate: Secondary | ICD-10-CM | POA: Diagnosis not present

## 2017-05-18 DIAGNOSIS — N289 Disorder of kidney and ureter, unspecified: Secondary | ICD-10-CM

## 2017-05-18 DIAGNOSIS — N189 Chronic kidney disease, unspecified: Secondary | ICD-10-CM

## 2017-05-18 DIAGNOSIS — E860 Dehydration: Secondary | ICD-10-CM

## 2017-05-18 DIAGNOSIS — D631 Anemia in chronic kidney disease: Secondary | ICD-10-CM

## 2017-05-18 DIAGNOSIS — M25571 Pain in right ankle and joints of right foot: Secondary | ICD-10-CM

## 2017-05-18 DIAGNOSIS — F17210 Nicotine dependence, cigarettes, uncomplicated: Secondary | ICD-10-CM | POA: Diagnosis not present

## 2017-05-18 DIAGNOSIS — R197 Diarrhea, unspecified: Secondary | ICD-10-CM | POA: Diagnosis not present

## 2017-05-18 DIAGNOSIS — I129 Hypertensive chronic kidney disease with stage 1 through stage 4 chronic kidney disease, or unspecified chronic kidney disease: Secondary | ICD-10-CM

## 2017-05-18 DIAGNOSIS — Z5111 Encounter for antineoplastic chemotherapy: Secondary | ICD-10-CM | POA: Diagnosis not present

## 2017-05-18 LAB — COMPREHENSIVE METABOLIC PANEL
ALBUMIN: 3.4 g/dL — AB (ref 3.5–5.0)
ALT: 12 U/L — ABNORMAL LOW (ref 17–63)
ANION GAP: 10 (ref 5–15)
AST: 18 U/L (ref 15–41)
Alkaline Phosphatase: 58 U/L (ref 38–126)
BUN: 24 mg/dL — ABNORMAL HIGH (ref 6–20)
CHLORIDE: 106 mmol/L (ref 101–111)
CO2: 20 mmol/L — AB (ref 22–32)
Calcium: 8.6 mg/dL — ABNORMAL LOW (ref 8.9–10.3)
Creatinine, Ser: 1.63 mg/dL — ABNORMAL HIGH (ref 0.61–1.24)
GFR calc Af Amer: 46 mL/min — ABNORMAL LOW (ref >60)
GFR calc non Af Amer: 40 mL/min — ABNORMAL LOW (ref >60)
GLUCOSE: 155 mg/dL — AB (ref 65–99)
POTASSIUM: 3.7 mmol/L (ref 3.5–5.1)
SODIUM: 136 mmol/L (ref 135–145)
Total Bilirubin: 0.5 mg/dL (ref 0.3–1.2)
Total Protein: 7.3 g/dL (ref 6.5–8.1)

## 2017-05-18 LAB — CBC WITH DIFFERENTIAL/PLATELET
BASOS PCT: 1 %
Basophils Absolute: 0.1 10*3/uL (ref 0–0.1)
Eosinophils Absolute: 0.2 10*3/uL (ref 0–0.7)
Eosinophils Relative: 2 %
HEMATOCRIT: 33.2 % — AB (ref 40.0–52.0)
HEMOGLOBIN: 10.3 g/dL — AB (ref 13.0–18.0)
LYMPHS ABS: 0.9 10*3/uL — AB (ref 1.0–3.6)
LYMPHS PCT: 8 %
MCH: 23.1 pg — ABNORMAL LOW (ref 26.0–34.0)
MCHC: 31 g/dL — AB (ref 32.0–36.0)
MCV: 74.7 fL — AB (ref 80.0–100.0)
MONOS PCT: 7 %
Monocytes Absolute: 0.8 10*3/uL (ref 0.2–1.0)
NEUTROS ABS: 9.2 10*3/uL — AB (ref 1.4–6.5)
NEUTROS PCT: 82 %
Platelets: 263 10*3/uL (ref 150–440)
RBC: 4.44 MIL/uL (ref 4.40–5.90)
RDW: 28.1 % — ABNORMAL HIGH (ref 11.5–14.5)
WBC: 11.1 10*3/uL — ABNORMAL HIGH (ref 3.8–10.6)

## 2017-05-18 MED ORDER — PROCHLORPERAZINE MALEATE 10 MG PO TABS
10.0000 mg | ORAL_TABLET | Freq: Once | ORAL | Status: AC
  Administered 2017-05-18: 10 mg via ORAL
  Filled 2017-05-18: qty 1

## 2017-05-18 MED ORDER — BORTEZOMIB CHEMO SQ INJECTION 3.5 MG (2.5MG/ML)
1.3000 mg/m2 | Freq: Once | INTRAMUSCULAR | Status: AC
  Administered 2017-05-18: 2.75 mg via SUBCUTANEOUS
  Filled 2017-05-18: qty 2.75

## 2017-05-18 MED ORDER — SODIUM CHLORIDE 0.9 % IV SOLN
INTRAVENOUS | Status: DC
  Administered 2017-05-18: 15:00:00 via INTRAVENOUS

## 2017-05-18 NOTE — Progress Notes (Signed)
Patient here today for follow up.   

## 2017-05-18 NOTE — Progress Notes (Signed)
Patient to receive Velcade today per Dr. Tasia Catchings. Patient receiving one liter of fluid as well.

## 2017-05-19 ENCOUNTER — Ambulatory Visit: Payer: Medicare Other

## 2017-05-19 NOTE — Progress Notes (Signed)
Hematology/Oncology Follow Up Note [facility] [phone] [phone]  Patient Care Team: Jodi Marble, MD as PCP - General (Internal Medicine) Bary Castilla, Forest Gleason, MD (General Surgery) Edrick Kins, MD as Rounding Team (Internal Medicine) Hillary Bow, MD as Consulting Physician (Internal Medicine) [name], MD as Surgeon (Gastroenterology) Isaias Cowman, MD as Consulting Physician (Cardiology)  REFERRING PROVIDER: Dr.Lateef, [place] CHIEF COMPLAINTS/REASON FOR VISIT  Abnormal UPEP results, follow up for evaluation of monoclonal gammopathy  HISTORY OF PRESENTING ILLNESS:  76 y.o.  male with PMH listed below who presents to follow up on the evaluation and management of his abnormal urine protein electrophoresis results. I reviewed the records from [facility], [place] and [place] was performed and HemOnc related medical problems are listed below.  He lives with a friend. He has 3 adult children.   1 Chronic Kidney disease, Stage II: patient follows up with Dr.Lateef.  2 Proteinuria:  02/18/2017: Albumin/Creatinine ratio was 7, Urine protein electrophoresis,Random Urine revealed M Spike 43.2%, total protein of 43.62m/dl,  01/05/2017 Albumin/Creatinine ratio was 455.2, Albumin 996.5, , 2.15.2018 Albumin/Creatinine ratio was 80.3, Albumin 73.3, Urine protein electrophoresis,Random Urine revealed M Spike 29.7%, total protein of 29.118mdl, 06/30/2016 Serum protein electrophoresis did not detect M spike.  Autoimmune disorder work up showed Negative anti dsDNA, RNP antibodies, smith antibody, Sjogren antibodies, anti Jo-1, positive antichromotin antibodies, positive ANA,  3 Anemia of chronic kidney disease:  4 Iron deficiency anemia: history of iron deficiency, ferritin was 7 on 08/11/2016. He had EGD and colonoscopy that were done this year which showed  duodenitis/esphagitis/diverticulosis/benigh polyps. 5 Rheumatoid arthritis: he follows up with Dr.Kernodle and is on chronic MTX. He reports joint pains are controlled, not quite symptomatic.   Patient denies any persistent bone pain or any pain. He continue to feel lack of energy, persistent, not improved with resting or taking naps. He also has lost 25 pounds in the past year which was unintentional.   INTERVAL HISTORY Patient presents for follow up for management of multiple myeloma. The patient has started first cycle of RVD every 21 days schedule. Today is day 8. Patient reports feeling well except he has experienced joint pain. He feels left hand joints are tight, as well as bilateral ankle joints aches. Denies any nausea vomiting. He reports having 2 episodes of loose stool which is resolved spontaneously. Appetite is fair. Rheumatoid arthritis and currently is off MTX.   Review of Systems  Constitutional: Positive for fatigue. Negative for appetite change.  HENT:   Negative for hearing loss and lump/mass.   Eyes: Negative for eye problems.  Respiratory: Negative for chest tightness and cough.   Cardiovascular: Negative for chest pain.  Gastrointestinal: Negative for abdominal distention.  Endocrine: Negative for hot flashes.  Genitourinary: Negative for difficulty urinating and dyspareunia.   Musculoskeletal: Negative for arthralgias, back pain and gait problem.  Skin: Negative for itching and rash.  Neurological: Negative for dizziness and gait problem.  Hematological: Negative for adenopathy. Does not bruise/bleed easily.  Psychiatric/Behavioral: Negative for confusion. The patient is not nervous/anxious.      MEDICAL HISTORY:  Past Medical History:  Diagnosis Date  . Anxiety   . Arthritis    rheumatoid  . Atrial fibrillation (HCC)    hx of  . Cancer ([place]  . Chronic kidney disease   . Colon polyp 07-07-15   TUBULAR ADENOMA WITH AT LEAST HIGH-GRADE / Dr ReRayann Heman.  Diabetes mellitus without complication ([place]  .  Dysrhythmia    bradycardia...Marland KitchenMarland Kitchen2 to 1 heart block  . GERD (gastroesophageal reflux disease)   . Heart murmur    patient unaware of history of murmur  . Hypercholesterolemia   . Hypertension   . Presence of permanent cardiac pacemaker   . Prostate cancer ([place]) 01/01/13, 01/30/14   Gleason 3+4=7, volume 46.6 cc  . Rheumatoid arthritis ([place])   . S/P radiation therapy  04/03/2014 through 06/04/2014                                                      Prostate 7800 cGy in 40 sessions                          . Sleep apnea    uses cpap but it is not in the hospital with him    SURGICAL HISTORY: Past Surgical History:  Procedure Laterality Date  . ABDOMINAL AORTIC ANEURYSM REPAIR  11/2013  . COLON SURGERY  March 2017   Right hemicolectomy for tubulovillous adenoma with high-grade dysplasia.  . COLONOSCOPY WITH PROPOFOL N/A 07/07/2015   Procedure: COLONOSCOPY WITH PROPOFOL;  Surgeon: Josefine Class, MD;  Location: [facility] ENDOSCOPY;  Service: Endoscopy;  Laterality: N/A;  . COLONOSCOPY WITH PROPOFOL N/A 10/28/2016   Procedure: COLONOSCOPY WITH PROPOFOL;  Surgeon: [name], MD;  Location: [facility] ENDOSCOPY;  Service: Endoscopy;  Laterality: N/A;  . ESOPHAGOGASTRODUODENOSCOPY (EGD) WITH PROPOFOL N/A 10/28/2016   Procedure: ESOPHAGOGASTRODUODENOSCOPY (EGD) WITH PROPOFOL;  Surgeon: [name], MD;  Location[facility] ENDOSCOPY;  Service: Endoscopy;  Laterality: N/A;  . LAPAROSCOPIC RIGHT COLECTOMY Right 08/08/2015   Procedure: LAPAROSCOPIC RIGHT COLECTOMY;  Surgeon: Robert Bellow, MD;  Location: [facility] ORS;  Service: General;  Laterality: Right;  . NASAL SINUS SURGERY    . PACEMAKER INSERTION Left 08/26/2016   Procedure: INSERTION PACEMAKER;  Surgeon: Isaias Cowman, MD;  Location: [facility] ORS;  Service: Cardiovascular;  Laterality: Left;  . PROSTATE BIOPSY  01/01/13, 01/30/14   Gleason 3+3=6, vol 46.6 cc  . TONSILLECTOMY    . uvula surgery     for  sleep apnea    SOCIAL HISTORY: Social History   Socioeconomic History  . Marital status: Divorced    Spouse name: Not on file  . Number of children: Not on file  . Years of education: Not on file  . Highest education level: Not on file  Social Needs  . Financial resource strain: Not on file  . Food insecurity - worry: Not on file  . Food insecurity - inability: Not on file  . Transportation needs - medical: Not on file  . Transportation needs - non-medical: Not on file  Occupational History  . Not on file  Tobacco Use  . Smoking status: Current Every Day Smoker    Packs/day: 0.50    Years: 50.00    Pack years: 25.00    Types: Cigarettes  . Smokeless tobacco: Never Used  . Tobacco comment: DOWN TO 1/2 PPD  Substance and Sexual Activity  . Alcohol use: Yes    Alcohol/week: 0.6 oz    Types: 1 Cans of beer per week    Comment: occasionally  . Drug use: No  . Sexual activity: Not on file  Other Topics Concern  . Not on file  Social History Narrative  . Not  on file    FAMILY HISTORY: Family History  Problem Relation Age of Onset  . Heart attack Mother   . Cirrhosis Father   . Pancreatic cancer Brother   . Diabetes Brother   . Diabetes Daughter        medication induced for cancer treatments  . Cancer Daughter        breast, brain    ALLERGIES:  has No Known Allergies.  MEDICATIONS:  Current Outpatient Medications  Medication Sig Dispense Refill  . acyclovir (ZOVIRAX) 400 MG tablet Take 1 tablet (400 mg total) 2 (two) times daily by mouth. 60 tablet 3  . ALPRAZolam (XANAX) 0.25 MG tablet Take 0.25 mg by mouth 2 (two) times daily.    Marland Kitchen aspirin 81 MG chewable tablet Chew 81 mg by mouth daily.    Marland Kitchen atorvastatin (LIPITOR) 80 MG tablet Take 80 mg by mouth at bedtime.     . busPIRone (BUSPAR) 5 MG tablet Take 5 mg by mouth 2 (two) times daily.     . Butalbital-APAP-Caffeine 50-300-40 MG CAPS Take 1 capsule by mouth every 6 (six) hours as needed (for migraines).       . clopidogrel (PLAVIX) 75 MG tablet Take 75 mg by mouth daily.    Marland Kitchen dexamethasone (DECADRON) 4 MG tablet Take 10 tablets (40 mg) on days 1, 8, and 15 of chemo. Repeat every 21 days. Carepack 30 tablet 3  . fexofenadine (ALLEGRA) 180 MG tablet Take 180 mg by mouth daily.    . fluticasone (FLONASE) 50 MCG/ACT nasal spray Place 2 sprays into both nostrils daily as needed for rhinitis.     . Fluticasone Furoate-Vilanterol (BREO ELLIPTA) 100-25 MCG/INH AEPB Inhale 1 puff into the lungs daily.     . folic acid (FOLVITE) 1 MG tablet Take 1 mg by mouth daily.    . isosorbide mononitrate (IMDUR) 30 MG 24 hr tablet Take 30 mg by mouth daily.    . L-METHIONINE PO Take by mouth.    . lenalidomide (REVLIMID) 10 MG capsule Take 1 capsule (10 mg total) by mouth daily. Take one capsule daily on days 1-14 every 21 days. 14 capsule 0  . metFORMIN (GLUCOPHAGE) 500 MG tablet Take 500 mg by mouth 2 (two) times daily with a meal.    . methotrexate (RHEUMATREX) 15 MG tablet Take 15 mg by mouth once a week. Caution: Chemotherapy. Protect from light.    . ondansetron (ZOFRAN) 8 MG tablet Take 1 tablet (8 mg total) 2 (two) times daily as needed by mouth (Nausea or vomiting). 30 tablet 1  . pantoprazole (PROTONIX) 40 MG tablet Take 1 tablet (40 mg total) by mouth 2 (two) times daily. 60 tablet 3  . triamcinolone cream (KENALOG) 0.1 % Apply 1 application topically 2 (two) times daily.     No current facility-administered medications for this visit.       Marland Kitchen  PHYSICAL EXAMINATION: ECOG PERFORMANCE STATUS: 1 - Symptomatic but completely ambulatory Vitals:   05/18/17 1354  BP: 99/63  Pulse: 66  Temp: 98.4 F (36.9 C)   Filed Weights   05/18/17 1354  Weight: 208 lb 3 oz (94.4 kg)   Physical Exam  Constitutional: He is well-developed, well-nourished, and in no distress. No distress.  HENT:  Head: Normocephalic.  Mouth/Throat: No oropharyngeal exudate.  Eyes: Conjunctivae and EOM are normal. Pupils are equal,  round, and reactive to light. No scleral icterus.  Neck: Normal range of motion. Neck supple. No JVD present.  Cardiovascular:  Normal rate, regular rhythm and normal heart sounds.  Pulmonary/Chest: Effort normal and breath sounds normal. No respiratory distress.  Abdominal: Soft. Bowel sounds are normal. He exhibits no distension.  Musculoskeletal:  Bilateral ankle joint tenderness.     LABORATORY DATA:  I have reviewed the data as listed Lab Results  Component Value Date   WBC 11.1 (H) 05/18/2017   HGB 10.3 (L) 05/18/2017   HCT 33.2 (L) 05/18/2017   MCV 74.7 (L) 05/18/2017   PLT 263 05/18/2017   Recent Labs    05/02/17 1234 05/11/17 0802 05/18/17 1320  NA 138 139 136  K 4.5 4.3 3.7  CL 109 108 106  CO2 20* 23 20*  GLUCOSE 98 130* 155*  BUN 14 17 24*  CREATININE 1.25* 1.33* 1.63*  CALCIUM 9.1 9.1 8.6*  GFRNONAA 55* 51* 40*  GFRAA >60 59* 46*  PROT 7.9 7.6 7.3  ALBUMIN 4.0 3.8 3.4*  AST _0 ALT 15* 14* 12*  ALKPHOS 66 80 58  BILITOT 0.6 0.4 0.5    SPEP: no M spike Serum free light chain Kappa/Lamda ration 13.04 UPEP  Free light chain Kappa/lamda ratio 162.43, M spike 180m/24 hour   Bone marrow biopsy 03/21/2017  Bone Marrow, Aspirate,Biopsy, and Clot, left iliac - HYPERCELLULAR BONE MARROW FOR AGE WITH TRILINEAGE HEMATOPOIESIS. - PLASMACYTOSIS (PLASMA CELLS 8%)  Karyotype: loss of Y chromosome Cytogenetic MDS FISH Panel negative.   Bone marrow 04/06/2017  Bone Marrow, Aspirate,Biopsy, and Clot, right iliac and core - HYPERCELLULAR BONE MARROW FOR AGE WITH PLASMA CELL NEOPLASM - TRILINEAGE HEMATOPOIESIS. - SEE COMMENT. PERIPHERAL BLOOD: - MICROCYTIC-HYPOCHROMIC ANEMIA. Diagnosis Note The bone marrow is hypercellular with trilineage hematopoiesis but with relative abundance of erythroid precursors and increased number of megakaryocytes with nonspecific changes. Significant dyspoiesis is not seen. Iron stores are present with no ring sideroblasts.  The plasma cells are increased in number representing 8% of all cells in the aspirate although focal areas in the core biopsy show 10 to 20% as primarily seen by CD138 stain. In situ hybridization for kappa and lambda light chains show kappa light chain restriction consistent with plasma cell neoplasm. Correlation with cytogenetic and FISH studies is recommended. (BNS:ecj/gt 04/07/2017)  IMAGE STUDIES I have personally reviewed below image results.  03/15/2017 DG bone survey Met: Nonspecific lucent lesion in the distal right ulna measuring 15-16 mm, but otherwise normal bone mineralization for age throughout the visible skeleton. A solitary lytic lesion in a distal extremity would be an unusual presentation of multiple myeloma, and I favor the distal right ulna lesion is benign. Recommend correlation with serum and urine protein electrophoresis.  04/14/2017 PET scan: 1. 2.1 cm hypermetabolic soft tissue lesion in the left heel, adjacent to the calcaneal tuberosity. 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 2. Mottled FDG accumulation diffusely in the marrow space without other frankly overt hypermetabolic bony lesion. 3. Coronary artery and thoracoabdominal aortic atherosclerosis with abdominal aortic stent graft visualized in situ. 4. Bilateral renal cysts and bilateral nonobstructing renal stones.    ASSESSMENT & PLAN:  1. Multiple myeloma, remission status unspecified ([place]   2. Acute on chronic renal insufficiency   3. Acute bilateral ankle pain   Stage 1 Multiple Myeloma, Day 8 of cycle 1 RVD.  Revlimid 10 mg PO once per day on days 1 to 14 (dose reduced due to GFR) Velcade 1.3 mg/m2 IV once per day on days 1,  8,  Dexamethasone 40 mg PO once per day on days 1,  8, 15,  # Proceed Day 8 Velcade.  Revlimid renal dosing of 82m, titrate if any hematological toxicities.   # Acute on chronic renal insufficiency: ?dehydration of diarrhea/ decreased oral intake. Will give IV fluid 1 L NS  today. Repeat BMP on 05/20/2017. # Ankle pain: ? RA flare vs arthralgia as side effects of Revlimid/Velcade. Tylenol PRN for now.  # advise patient to continue Aspirin 871mand Plavix  # Continue acyclovir for prophylaxis.  All questions were answered. The patient knows to call the clinic with any problems questions or concerns.  Return of visit: 1 week to re-evaluate  ZhEarlie ServerMD, PhD Hematology Oncology CHThe [facility] Communityt AlDekalb Regional Medical Centerager- [phone]/13/2018

## 2017-05-20 ENCOUNTER — Inpatient Hospital Stay: Payer: Medicare Other

## 2017-05-20 ENCOUNTER — Other Ambulatory Visit: Payer: Self-pay | Admitting: Oncology

## 2017-05-20 ENCOUNTER — Inpatient Hospital Stay: Payer: Medicare Other | Admitting: Oncology

## 2017-05-20 DIAGNOSIS — E860 Dehydration: Secondary | ICD-10-CM

## 2017-05-20 DIAGNOSIS — C9000 Multiple myeloma not having achieved remission: Secondary | ICD-10-CM

## 2017-05-20 DIAGNOSIS — Z5111 Encounter for antineoplastic chemotherapy: Secondary | ICD-10-CM | POA: Diagnosis not present

## 2017-05-20 LAB — BASIC METABOLIC PANEL
Anion gap: 9 (ref 5–15)
BUN: 29 mg/dL — AB (ref 6–20)
CHLORIDE: 108 mmol/L (ref 101–111)
CO2: 21 mmol/L — ABNORMAL LOW (ref 22–32)
CREATININE: 1.3 mg/dL — AB (ref 0.61–1.24)
Calcium: 8.7 mg/dL — ABNORMAL LOW (ref 8.9–10.3)
GFR calc Af Amer: >60 mL/min (ref >60)
GFR, EST NON AFRICAN AMERICAN: 52 mL/min — AB (ref >60)
GLUCOSE: 109 mg/dL — AB (ref 65–99)
POTASSIUM: 4.2 mmol/L (ref 3.5–5.1)
SODIUM: 138 mmol/L (ref 135–145)

## 2017-05-20 MED ORDER — BACLOFEN 5 MG PO TABS: 5.0000 mg | ORAL_TABLET | Freq: Three times a day (TID) | ORAL | 0 refills | Status: DC | PRN

## 2017-05-20 MED ORDER — SODIUM CHLORIDE 0.9 % IV SOLN
Freq: Once | INTRAVENOUS | Status: AC
  Administered 2017-05-20: 15:00:00 via INTRAVENOUS
  Filled 2017-05-20: qty 1000

## 2017-05-20 NOTE — Addendum Note (Signed)
Addended by: Earlie Server on: 05/20/2017 05:16 PM   Modules accepted: Orders

## 2017-05-24 NOTE — Progress Notes (Signed)
Hematology/Oncology Follow Up Note [facility] [phone] [phone]  Patient Care Team: Jodi Marble, MD as PCP - General (Internal Medicine) Bary Castilla, Forest Gleason, MD (General Surgery) Edrick Kins, MD as Rounding Team (Internal Medicine) Hillary Bow, MD as Consulting Physician (Internal Medicine) [name], MD as Surgeon (Gastroenterology) Isaias Cowman, MD as Consulting Physician (Cardiology)  REFERRING PROVIDER: Dr.Lateef, [place] CHIEF COMPLAINTS/REASON FOR VISIT  Abnormal UPEP results, follow up for evaluation of monoclonal gammopathy  HISTORY OF PRESENTING ILLNESS:  76 y.o.  male with PMH listed below who presents to follow up on the evaluation and management of his abnormal urine protein electrophoresis results. I reviewed the records from [facility], [place] and [place] was performed and HemOnc related medical problems are listed below.  He lives with a friend. He has 3 adult children.   1 Chronic Kidney disease, Stage II: patient follows up with Dr.Lateef.  2 Proteinuria:  02/18/2017: Albumin/Creatinine ratio was 7, Urine protein electrophoresis,Random Urine revealed M Spike 43.2%, total protein of 43.77m/dl,  01/05/2017 Albumin/Creatinine ratio was 455.2, Albumin 996.5, , 2.15.2018 Albumin/Creatinine ratio was 80.3, Albumin 73.3, Urine protein electrophoresis,Random Urine revealed M Spike 29.7%, total protein of 29.139mdl, 06/30/2016 Serum protein electrophoresis did not detect M spike.  Autoimmune disorder work up showed Negative anti dsDNA, RNP antibodies, smith antibody, Sjogren antibodies, anti Jo-1, positive antichromotin antibodies, positive ANA,  3 Anemia of chronic kidney disease:  4 Iron deficiency anemia: history of iron deficiency, ferritin was 7 on 08/11/2016. He had EGD and colonoscopy that were done this year which showed  duodenitis/esphagitis/diverticulosis/benigh polyps. 5 Rheumatoid arthritis: he follows up with Dr.Kernodle and is on chronic MTX. He reports joint pains are controlled, not quite symptomatic.   Patient denies any persistent bone pain or any pain. He continue to feel lack of energy, persistent, not improved with resting or taking naps. He also has lost 25 pounds in the past year which was unintentional.   INTERVAL HISTORY Patient presents for follow up for management of multiple myeloma. The patient has the third week of first cycle of RVD every 21 days schedule. He has also got to IV fluid hydration done during interval after being found to have acute on chronic kidney insufficiency.  Patient reports feeling well. He had mild diarrhea for 2 days which resolved spontaneously.. Is joint pain has improved. He reports that he has gained weight. Denies any shortness of breath or chest pain, leg swelling. Rheumatoid arthritis and currently is off MTX.   Review of Systems  Constitutional: Positive for fatigue. Negative for appetite change.  HENT:   Negative for hearing loss and lump/mass.   Eyes: Negative for eye problems.  Respiratory: Negative for chest tightness and cough.   Cardiovascular: Negative for chest pain.  Gastrointestinal: Negative for abdominal distention.  Endocrine: Negative for hot flashes.  Genitourinary: Negative for difficulty urinating and dyspareunia.   Musculoskeletal: Negative for arthralgias, back pain and gait problem.  Skin: Negative for itching and rash.  Neurological: Negative for dizziness and gait problem.  Hematological: Negative for adenopathy. Does not bruise/bleed easily.  Psychiatric/Behavioral: Negative for confusion. The patient is not nervous/anxious.      MEDICAL HISTORY:  Past Medical History:  Diagnosis Date  . Anxiety   . Arthritis    rheumatoid  . Atrial fibrillation (HCC)    [place]  [place]  . Chronic kidney disease   . Colon polyp  07-07-15   TUBULAR ADENOMA  WITH AT LEAST HIGH-GRADE / Dr Rayann Heman  . Diabetes mellitus without complication ([place])   . Dysrhythmia    bradycardia...Marland KitchenMarland Kitchen2 to 1 heart block  . GERD (gastroesophageal reflux disease)   . Heart murmur    patient unaware of history of murmur  . Hypercholesterolemia   . Hypertension   . Presence of permanent cardiac pacemaker   . Prostate cancer ([place]) 01/01/13, 01/30/14   Gleason 3+4=7, volume 46.6 cc  . Rheumatoid arthritis ([place])   . S/P radiation therapy  04/03/2014 through 06/04/2014                                                      Prostate 7800 cGy in 40 sessions                          . Sleep apnea    uses cpap but it is not in the hospital with him    SURGICAL HISTORY: Past Surgical History:  Procedure Laterality Date  . ABDOMINAL AORTIC ANEURYSM REPAIR  11/2013  . COLON SURGERY  March 2017   Right hemicolectomy for tubulovillous adenoma with high-grade dysplasia.  . COLONOSCOPY WITH PROPOFOL N/A 07/07/2015   Procedure: COLONOSCOPY WITH PROPOFOL;  Surgeon: Josefine Class, MD;  Location: [facility] ENDOSCOPY;  Service: Endoscopy;  Laterality: N/A;  . COLONOSCOPY WITH PROPOFOL N/A 10/28/2016   Procedure: COLONOSCOPY WITH PROPOFOL;  Surgeon: [name], MD;  Location: [facility] ENDOSCOPY;  Service: Endoscopy;  Laterality: N/A;  . ESOPHAGOGASTRODUODENOSCOPY (EGD) WITH PROPOFOL N/A 10/28/2016   Procedure: ESOPHAGOGASTRODUODENOSCOPY (EGD) WITH PROPOFOL;  Surgeon: [name], MD;  Location[facility] END[facility]COPY;  Service: Endoscopy;  Laterality: N/A;  . LAPAROSCOPIC RIGHT COLECTOMY Right 08/08/2015   Procedure: LAPAROSCOPIC RIGHT COLECTOMY;  Surgeon: Robert Bellow, MD;  Location: [facility] ORS;  Service: General;  Laterality: Right;  . NASAL SINUS SURGERY    . PACEMAKER INSERTION Left 08/26/2016   Procedure: INSERTION PACEMAKER;  Surgeon: Isaias Cowman, MD;  Location: [facility] ORS;  Service: Cardiovascular;  Laterality: Left;  . PROSTATE BIOPSY  01/01/13, 01/30/14   Gleason  3+3=6, vol 46.6 cc  . TONSILLECTOMY    . uvula surgery     for sleep apnea    SOCIAL HISTORY: Social History   Socioeconomic History  . Marital status: Divorced    Spouse name: Not on file  . Number of children: Not on file  . Years of education: Not on file  . Highest education level: Not on file  Social Needs  . Financial resource strain: Not on file  . Food insecurity - worry: Not on file  . Food insecurity - inability: Not on file  . Transportation needs - medical: Not on file  . Transportation needs - non-medical: Not on file  Occupational History  . Not on file  Tobacco Use  . Smoking status: Current Every Day Smoker    Packs/day: 0.50    Years: 50.00    Pack years: 25.00    Types: Cigarettes  . Smokeless tobacco: Never Used  . Tobacco comment: DOWN TO 1/2 PPD  Substance and Sexual Activity  . Alcohol use: Yes    Alcohol/week: 0.6 oz    Types: 1 Cans of beer per week    Comment: occasionally  . Drug use: No  . Sexual activity: Not on  file  Other Topics Concern  . Not on file  Social History Narrative  . Not on file    FAMILY HISTORY: Family History  Problem Relation Age of Onset  . Heart attack Mother   . Cirrhosis Father   . Pancreatic cancer Brother   . Diabetes Brother   . Diabetes Daughter        medication induced for cancer treatments  . Cancer Daughter        breast, brain    ALLERGIES:  has No Known Allergies.  MEDICATIONS:  Current Outpatient Medications  Medication Sig Dispense Refill  . acyclovir (ZOVIRAX) 400 MG tablet Take 1 tablet (400 mg total) 2 (two) times daily by mouth. 60 tablet 3  . ALPRAZolam (XANAX) 0.25 MG tablet Take 0.25 mg by mouth 2 (two) times daily.    Marland Kitchen aspirin 81 MG chewable tablet Chew 81 mg by mouth daily.    Marland Kitchen atorvastatin (LIPITOR) 80 MG tablet Take 80 mg by mouth at bedtime.     . Baclofen 5 MG TABS Take 5 mg by mouth 3 (three) times daily as needed (hiccups). 21 tablet 0  . busPIRone (BUSPAR) 5 MG tablet  Take 5 mg by mouth 2 (two) times daily.     . Butalbital-APAP-Caffeine 50-300-40 MG CAPS Take 1 capsule by mouth every 6 (six) hours as needed (for migraines).     . clopidogrel (PLAVIX) 75 MG tablet Take 75 mg by mouth daily.    Marland Kitchen dexamethasone (DECADRON) 4 MG tablet Take 10 tablets (40 mg) on days 1, 8, and 15 of chemo. Repeat every 21 days. Carepack 30 tablet 3  . fexofenadine (ALLEGRA) 180 MG tablet Take 180 mg by mouth daily.    . fluticasone (FLONASE) 50 MCG/ACT nasal spray Place 2 sprays into both nostrils daily as needed for rhinitis.     . Fluticasone Furoate-Vilanterol (BREO ELLIPTA) 100-25 MCG/INH AEPB Inhale 1 puff into the lungs daily.     . folic acid (FOLVITE) 1 MG tablet Take 1 mg by mouth daily.    . isosorbide mononitrate (IMDUR) 30 MG 24 hr tablet Take 30 mg by mouth daily.    . L-METHIONINE PO Take by mouth.    . lenalidomide (REVLIMID) 10 MG capsule Take 1 capsule (10 mg total) by mouth daily. Take one capsule daily on days 1-14 every 21 days. 14 capsule 0  . metFORMIN (GLUCOPHAGE) 500 MG tablet Take 500 mg by mouth 2 (two) times daily with a meal.    . methotrexate (RHEUMATREX) 15 MG tablet Take 15 mg by mouth once a week. Caution: Chemotherapy. Protect from light.    . ondansetron (ZOFRAN) 8 MG tablet Take 1 tablet (8 mg total) 2 (two) times daily as needed by mouth (Nausea or vomiting). 30 tablet 1  . pantoprazole (PROTONIX) 40 MG tablet Take 1 tablet (40 mg total) by mouth 2 (two) times daily. 60 tablet 3  . triamcinolone cream (KENALOG) 0.1 % Apply 1 application topically 2 (two) times daily.     No current facility-administered medications for this visit.       Marland Kitchen  PHYSICAL EXAMINATION: ECOG PERFORMANCE STATUS: 1 - Symptomatic but completely ambulatory Vitals:   05/25/17 1356  BP: 123/77  Pulse: 68  Resp: 16  Temp: 98.7 F (37.1 C)   Filed Weights   05/25/17 1356  Weight: 208 lb (94.3 kg)   Physical Exam  Constitutional: He is oriented to person, place,  and time and well-developed, well-nourished, and in  no distress. No distress.  HENT:  Head: Normocephalic.  Mouth/Throat: No oropharyngeal exudate.  Eyes: Conjunctivae and EOM are normal. Pupils are equal, round, and reactive to light. Right eye exhibits no discharge. No scleral icterus.  Neck: Normal range of motion. Neck supple. No JVD present.  Cardiovascular: Normal rate, regular rhythm and normal heart sounds.  Pulmonary/Chest: Effort normal and breath sounds normal. No respiratory distress.  Abdominal: Soft. Bowel sounds are normal. He exhibits no distension. There is no tenderness.  Musculoskeletal: Normal range of motion. He exhibits no edema.  Bilateral ankle joint tenderness.   Lymphadenopathy:    He has no cervical adenopathy.  Neurological: He is alert and oriented to person, place, and time. No cranial nerve deficit.  Skin: Skin is dry. No erythema.  Psychiatric: Affect and judgment normal.    LABORATORY DATA:  I have reviewed the data as listed Lab Results  Component Value Date   WBC 11.1 (H) 05/18/2017   HGB 10.3 (L) 05/18/2017   HCT 33.2 (L) 05/18/2017   MCV 74.7 (L) 05/18/2017   PLT 263 05/18/2017   Recent Labs    05/02/17 1234 05/11/17 0802 05/18/17 1320 05/20/17 1325  NA 138 139 136 138  K 4.5 4.3 3.7 4.2  CL 109 108 106 108  CO2 20* 23 20* 21*  GLUCOSE 98 130* 155* 109*  BUN 14 17 24* 29*  CREATININE 1.25* 1.33* 1.63* 1.30*  CALCIUM 9.1 9.1 8.6* 8.7*  GFRNONAA 55* 51* 40* 52*  GFRAA >60 59* 46* >60  PROT 7.9 7.6 7.3  --   ALBUMIN 4.0 3.8 3.4*  --   AST '16 19 18  ' --   ALT 15* 14* 12*  --   ALKPHOS 66 80 58  --   BILITOT 0.6 0.4 0.5  --     SPEP: no M spike Serum free light chain Kappa/Lamda ration 13.04 UPEP  Free light chain Kappa/lamda ratio 162.43, M spike 149m/24 hour   Bone marrow biopsy 03/21/2017  Bone Marrow, Aspirate,Biopsy, and Clot, left iliac - HYPERCELLULAR BONE MARROW FOR AGE WITH TRILINEAGE HEMATOPOIESIS. - PLASMACYTOSIS  (PLASMA CELLS 8%)  Karyotype: loss of Y chromosome Cytogenetic MDS FISH Panel negative.   Bone marrow 04/06/2017  Bone Marrow, Aspirate,Biopsy, and Clot, right iliac and core - HYPERCELLULAR BONE MARROW FOR AGE WITH PLASMA CELL NEOPLASM - TRILINEAGE HEMATOPOIESIS. - SEE COMMENT. PERIPHERAL BLOOD: - MICROCYTIC-HYPOCHROMIC ANEMIA. Diagnosis Note The bone marrow is hypercellular with trilineage hematopoiesis but with relative abundance of erythroid precursors and increased number of megakaryocytes with nonspecific changes. Significant dyspoiesis is not seen. Iron stores are present with no ring sideroblasts. The plasma cells are increased in number representing 8% of all cells in the aspirate although focal areas in the core biopsy show 10 to 20% as primarily seen by CD138 stain. In situ hybridization for kappa and lambda light chains show kappa light chain restriction consistent with plasma cell neoplasm. Correlation with cytogenetic and FISH studies is recommended. (BNS:ecj/gt 04/07/2017)  IMAGE STUDIES I have personally reviewed below image results.  03/15/2017 DG bone survey Met: Nonspecific lucent lesion in the distal right ulna measuring 15-16 mm, but otherwise normal bone mineralization for age throughout the visible skeleton. A solitary lytic lesion in a distal extremity would be an unusual presentation of multiple myeloma, and I favor the distal right ulna lesion is benign. Recommend correlation with serum and urine protein electrophoresis.  04/14/2017 PET scan: 1. 2.1 cm hypermetabolic soft tissue lesion in the left heel, adjacent to  the calcaneal tuberosity. 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 2. Mottled FDG accumulation diffusely in the marrow space without other frankly overt hypermetabolic bony lesion. 3. Coronary artery and thoracoabdominal aortic atherosclerosis with abdominal aortic stent graft visualized in situ. 4. Bilateral renal cysts and bilateral nonobstructing  renal stones.    ASSESSMENT & PLAN:  1. Multiple myeloma, remission status unspecified ([place])   2. Acute on chronic renal insufficiency   3. Iron deficiency anemia, unspecified iron deficiency anemia type   4. Encounter for antineoplastic chemotherapy   Stage 1 Multiple Myeloma, Day 15 of cycle 1 RVD.  Revlimid 10 mg PO once per day on days 1 to 14 (dose reduced due to GFR) Velcade 1.3 mg/m2 IV once per day on days 1,  8,  Dexamethasone 40 mg PO once per day on days 1, 8, 15, we'll lower down to 20 mg weekly given patient's age. And history of diabetes.  # Proceed Day 15 Velcade.  Revlimid renal dosing of 7m, titrate if any hematological toxicities.   # Acute on chronic renal insufficiency: Kidney function improved after 2 sessions of hydration.   # Ankle pain: Resolved. Tylenol PRN  # advise patient to continue Aspirin 844mand Plavix  # Continue acyclovir for prophylaxis.   All questions were answered. The patient knows to call the clinic with any problems questions or concerns.  Return of visit: 1 week to re-evaluate  ZhEarlie ServerMD, PhD Hematology Oncology CHEh[facility] Eye Surgery LLCt AlLong Island Jewish Forest Hills Hospitalager- [phone]/18/2018

## 2017-05-25 ENCOUNTER — Encounter: Payer: Self-pay | Admitting: Oncology

## 2017-05-25 ENCOUNTER — Telehealth: Payer: Self-pay | Admitting: Pharmacist

## 2017-05-25 ENCOUNTER — Inpatient Hospital Stay (HOSPITAL_BASED_OUTPATIENT_CLINIC_OR_DEPARTMENT_OTHER): Payer: Medicare Other | Admitting: Oncology

## 2017-05-25 ENCOUNTER — Inpatient Hospital Stay: Payer: Medicare Other

## 2017-05-25 VITALS — BP 123/77 | HR 68 | Temp 98.7°F | Resp 16 | Wt 208.0 lb

## 2017-05-25 DIAGNOSIS — I129 Hypertensive chronic kidney disease with stage 1 through stage 4 chronic kidney disease, or unspecified chronic kidney disease: Secondary | ICD-10-CM

## 2017-05-25 DIAGNOSIS — Z8546 Personal history of malignant neoplasm of prostate: Secondary | ICD-10-CM | POA: Diagnosis not present

## 2017-05-25 DIAGNOSIS — F17210 Nicotine dependence, cigarettes, uncomplicated: Secondary | ICD-10-CM | POA: Diagnosis not present

## 2017-05-25 DIAGNOSIS — N189 Chronic kidney disease, unspecified: Secondary | ICD-10-CM

## 2017-05-25 DIAGNOSIS — M25572 Pain in left ankle and joints of left foot: Secondary | ICD-10-CM

## 2017-05-25 DIAGNOSIS — Z5111 Encounter for antineoplastic chemotherapy: Secondary | ICD-10-CM

## 2017-05-25 DIAGNOSIS — M25571 Pain in right ankle and joints of right foot: Secondary | ICD-10-CM

## 2017-05-25 DIAGNOSIS — C9000 Multiple myeloma not having achieved remission: Secondary | ICD-10-CM

## 2017-05-25 DIAGNOSIS — D509 Iron deficiency anemia, unspecified: Secondary | ICD-10-CM

## 2017-05-25 DIAGNOSIS — N182 Chronic kidney disease, stage 2 (mild): Secondary | ICD-10-CM | POA: Diagnosis not present

## 2017-05-25 DIAGNOSIS — D631 Anemia in chronic kidney disease: Secondary | ICD-10-CM | POA: Diagnosis not present

## 2017-05-25 DIAGNOSIS — Z79899 Other long term (current) drug therapy: Secondary | ICD-10-CM | POA: Diagnosis not present

## 2017-05-25 DIAGNOSIS — N289 Disorder of kidney and ureter, unspecified: Secondary | ICD-10-CM

## 2017-05-25 LAB — COMPREHENSIVE METABOLIC PANEL
ALBUMIN: 3.3 g/dL — AB (ref 3.5–5.0)
ALT: 38 U/L (ref 17–63)
AST: 31 U/L (ref 15–41)
Alkaline Phosphatase: 67 U/L (ref 38–126)
Anion gap: 7 (ref 5–15)
BUN: 14 mg/dL (ref 6–20)
CHLORIDE: 109 mmol/L (ref 101–111)
CO2: 22 mmol/L (ref 22–32)
Calcium: 8.5 mg/dL — ABNORMAL LOW (ref 8.9–10.3)
Creatinine, Ser: 1.26 mg/dL — ABNORMAL HIGH (ref 0.61–1.24)
GFR calc Af Amer: >60 mL/min (ref >60)
GFR, EST NON AFRICAN AMERICAN: 54 mL/min — AB (ref >60)
Glucose, Bld: 143 mg/dL — ABNORMAL HIGH (ref 65–99)
POTASSIUM: 4.5 mmol/L (ref 3.5–5.1)
Sodium: 138 mmol/L (ref 135–145)
Total Bilirubin: 0.5 mg/dL (ref 0.3–1.2)
Total Protein: 7 g/dL (ref 6.5–8.1)

## 2017-05-25 LAB — CBC WITH DIFFERENTIAL/PLATELET
Basophils Absolute: 0.2 10*3/uL — ABNORMAL HIGH (ref 0–0.1)
Basophils Relative: 2 %
EOS ABS: 0.3 10*3/uL (ref 0–0.7)
Eosinophils Relative: 3 %
HCT: 30.8 % — ABNORMAL LOW (ref 40.0–52.0)
Hemoglobin: 9.3 g/dL — ABNORMAL LOW (ref 13.0–18.0)
LYMPHS ABS: 0.7 10*3/uL — AB (ref 1.0–3.6)
LYMPHS PCT: 6 %
MCH: 22.7 pg — AB (ref 26.0–34.0)
MCHC: 30.2 g/dL — AB (ref 32.0–36.0)
MCV: 75.3 fL — AB (ref 80.0–100.0)
MONOS PCT: 14 %
Monocytes Absolute: 1.6 10*3/uL — ABNORMAL HIGH (ref 0.2–1.0)
NEUTROS ABS: 8.4 10*3/uL — AB (ref 1.4–6.5)
NEUTROS PCT: 75 %
Platelets: 185 10*3/uL (ref 150–440)
RBC: 4.1 MIL/uL — AB (ref 4.40–5.90)
RDW: 28.1 % — ABNORMAL HIGH (ref 11.5–14.5)
WBC: 11.3 10*3/uL — AB (ref 3.8–10.6)

## 2017-05-25 MED ORDER — PROCHLORPERAZINE MALEATE 10 MG PO TABS
10.0000 mg | ORAL_TABLET | Freq: Once | ORAL | Status: AC
  Administered 2017-05-25: 10 mg via ORAL
  Filled 2017-05-25: qty 1

## 2017-05-25 MED ORDER — DEXAMETHASONE 4 MG PO TABS: ORAL_TABLET | ORAL | 3 refills | Status: DC

## 2017-05-25 MED ORDER — LOPERAMIDE HCL 2 MG PO CAPS: 2.0000 mg | ORAL_CAPSULE | ORAL | 0 refills | Status: DC

## 2017-05-25 MED ORDER — BORTEZOMIB CHEMO SQ INJECTION 3.5 MG (2.5MG/ML)
1.3000 mg/m2 | Freq: Once | INTRAMUSCULAR | Status: AC
  Administered 2017-05-25: 2.75 mg via SUBCUTANEOUS
  Filled 2017-05-25: qty 2.75

## 2017-05-25 NOTE — Telephone Encounter (Signed)
Oral Chemotherapy Pharmacist Encounter  Due to elevated blood glucose patient's dexamethasone is being decreased from 40mg  to 20mg . Rx was sent to [facility].  Darl Pikes, PharmD, BCPS Hematology/Oncology Clinical Pharmacist [facility] [phone]  05/25/2017 3:34 PM

## 2017-05-25 NOTE — Addendum Note (Signed)
Addended by: Earlie Server on: 05/25/2017 05:24 PM   Modules accepted: Orders

## 2017-05-25 NOTE — Addendum Note (Signed)
Addended by: Darl Pikes on: 05/25/2017 04:09 PM   Modules accepted: Orders

## 2017-05-27 ENCOUNTER — Other Ambulatory Visit: Payer: Self-pay | Admitting: *Deleted

## 2017-05-27 DIAGNOSIS — C9000 Multiple myeloma not having achieved remission: Secondary | ICD-10-CM

## 2017-05-30 ENCOUNTER — Other Ambulatory Visit: Payer: Self-pay | Admitting: *Deleted

## 2017-05-30 DIAGNOSIS — C9000 Multiple myeloma not having achieved remission: Secondary | ICD-10-CM

## 2017-05-30 MED ORDER — LENALIDOMIDE 10 MG PO CAPS: 10.0000 mg | ORAL_CAPSULE | Freq: Every day | ORAL | 0 refills | Status: DC

## 2017-05-31 ENCOUNTER — Other Ambulatory Visit: Payer: Self-pay | Admitting: Oncology

## 2017-05-31 NOTE — Progress Notes (Signed)
Hematology/Oncology Follow Up Note [name]:([phone] [phone]  Patient Care Team: Jodi Marble, MD as PCP - General (Internal Medicine) Bary Castilla, Forest Gleason, MD (General Surgery) Edrick Kins, MD as Rounding Team (Internal Medicine) Hillary Bow, MD as Consulting Physician (Internal Medicine) [name], MD as Surgeon (Gastroenterology) Isaias Cowman, MD as Consulting Physician (Cardiology)  REFERRING PROVIDER: Dr.Lateef, [place] CHIEF COMPLAINTS/REASON FOR VISIT  Abnormal UPEP results, follow up for evaluation of monoclonal gammopathy  HISTORY OF PRESENTING ILLNESS:  76 y.o.  male with PMH listed below who presents to follow up on the evaluation and management of his abnormal urine protein electrophoresis results. I reviewed the records from [facility], [place] and [place] was performed and HemOnc related medical problems are listed below.  He lives with a friend. He has 3 adult children.   1 Chronic Kidney disease, Stage II: patient follows up with Dr.Lateef.  2 Proteinuria:  02/18/2017: Albumin/Creatinine ratio was 7, Urine protein electrophoresis,Random Urine revealed M Spike 43.2%, total protein of 43.44m/dl,  01/05/2017 Albumin/Creatinine ratio was 455.2, Albumin 996.5, , 2.15.2018 Albumin/Creatinine ratio was 80.3, Albumin 73.3, Urine protein electrophoresis,Random Urine revealed M Spike 29.7%, total protein of 29.187mdl, 06/30/2016 Serum protein electrophoresis did not detect M spike.  Autoimmune disorder work up showed Negative anti dsDNA, RNP antibodies, smith antibody, Sjogren antibodies, anti Jo-1, positive antichromotin antibodies, positive ANA,  3 Anemia of chronic kidney disease:  4 Iron deficiency anemia: history of iron deficiency, ferritin was 7 on 08/11/2016. He had EGD and colonoscopy that were done this year which showed  duodenitis/esphagitis/diverticulosis/benigh polyps. 5 Rheumatoid arthritis: he follows up with Dr.Kernodle and is on chronic MTX. He reports joint pains are controlled, not quite symptomatic.   Patient denies any persistent bone pain or any pain. He continue to feel lack of energy, persistent, not improved with resting or taking naps. He also has lost 25 pounds in the past year which was unintentional.   INTERVAL HISTORY Patient presents for follow up for management of multiple myeloma. Patient is starting day 1 of second cycle V RD.  Patient reports several episodes of loose stool. He is out of town so he has not started taking his Imodium. He feels little weak today. Denies any numbness or tingling. Rheumatoid arthritis and currently is off MTX.   Review of Systems  Constitutional: Positive for fatigue. Negative for appetite change.  HENT:   Negative for hearing loss, lump/mass and sore throat.   Eyes: Negative for eye problems.  Respiratory: Positive for hemoptysis. Negative for chest tightness and cough.   Cardiovascular: Negative for chest pain.  Gastrointestinal: Positive for diarrhea. Negative for abdominal distention.  Endocrine: Negative for hot flashes.  Genitourinary: Negative for difficulty urinating, dyspareunia and frequency.   Musculoskeletal: Negative for arthralgias, back pain and gait problem.  Skin: Negative for itching and rash.  Neurological: Negative for dizziness, gait problem and seizures.  Hematological: Negative for adenopathy. Does not bruise/bleed easily.  Psychiatric/Behavioral: Negative for confusion and depression. The patient is not nervous/anxious.      MEDICAL HISTORY:  Past Medical History:  Diagnosis Date  . Anxiety   . Arthritis    rheumatoid  . Atrial fibrillation (HCC)    hx of  . Cancer ([facility]  . Chronic kidney disease   . Colon polyp 07-07-15   TUBULAR ADENOMA WITH AT LEAST HIGH-GRADE / Dr ReRayann Heman. Diabetes mellitus without complication  ([facility]  . Dysrhythmia  bradycardia...Marland KitchenMarland Kitchen2 to 1 heart block  . GERD (gastroesophageal reflux disease)   . Heart murmur    patient unaware of history of murmur  . Hypercholesterolemia   . Hypertension   . Presence of permanent cardiac pacemaker   . Prostate cancer ([place]) 01/01/13, 01/30/14   Gleason 3+4=7, volume 46.6 cc  . Rheumatoid arthritis ([place])   . S/P radiation therapy  04/03/2014 through 06/04/2014                                                      Prostate 7800 cGy in 40 sessions                          . Sleep apnea    uses cpap but it is not in the hospital with him    SURGICAL HISTORY: Past Surgical History:  Procedure Laterality Date  . ABDOMINAL AORTIC ANEURYSM REPAIR  11/2013  . COLON SURGERY  March 2017   Right hemicolectomy for tubulovillous adenoma with high-grade dysplasia.  . COLONOSCOPY WITH PROPOFOL N/A 07/07/2015   Procedure: COLONOSCOPY WITH PROPOFOL;  Surgeon: Josefine Class, MD;  Location: [facility] ENDOSCOPY;  Service: Endoscopy;  Laterality: N/A;  . COLONOSCOPY WITH PROPOFOL N/A 10/28/2016   Procedure: COLONOSCOPY WITH PROPOFOL;  Surgeon: [name], MD;  Location: [facility] ENDOSCOPY;  Service: Endoscopy;  Laterality: N/A;  . ESOPHAGOGASTRODUODENOSCOPY (EGD) WITH PROPOFOL N/A 10/28/2016   Procedure: ESOPHAGOGASTRODUODENOSCOPY (EGD) WITH PROPOFOL;  Surgeon: [name], MD;  Location: [facility] ENDOSCOPY;  Service: Endoscopy;  Laterality: N/A;  . LAPAROSCOPIC RIGHT COLECTOMY Right 08/08/2015   Procedure: LAPAROSCOPIC RIGHT COLECTOMY;  Surgeon: Robert Bellow, MD;  Location: [facility] ORS;  Service: General;  Laterality: Right;  . NASAL SINUS SURGERY    . PACEMAKER INSERTION Left 08/26/2016   Procedure: INSERTION PACEMAKER;  Surgeon: Isaias Cowman, MD;  Location: [facility] ORS;  Service: Cardiovascular;  Laterality: Left;  . PROSTATE BIOPSY  01/01/13, 01/30/14   Gleason 3+3=6, vol 46.6 cc  . TONSILLECTOMY    . uvula surgery     for sleep apnea    SOCIAL  HISTORY: Social History   Socioeconomic History  . Marital status: Divorced    Spouse name: Not on file  . Number of children: Not on file  . Years of education: Not on file  . Highest education level: Not on file  Social Needs  . Financial resource strain: Not on file  . Food insecurity - worry: Not on file  . Food insecurity - inability: Not on file  . Transportation needs - medical: Not on file  . Transportation needs - non-medical: Not on file  Occupational History  . Not on file  Tobacco Use  . Smoking status: Current Every Day Smoker    Packs/day: 0.50    Years: 50.00    Pack years: 25.00    Types: Cigarettes  . Smokeless tobacco: Never Used  . Tobacco comment: DOWN TO 1/2 PPD  Substance and Sexual Activity  . Alcohol use: Yes    Alcohol/week: 0.6 oz    Types: 1 Cans of beer per week    Comment: occasionally  . Drug use: No  . Sexual activity: Not on file  Other Topics Concern  . Not on file  Social History Narrative  . Not on file  FAMILY HISTORY: Family History  Problem Relation Age of Onset  . Heart attack Mother   . Cirrhosis Father   . Pancreatic cancer Brother   . Diabetes Brother   . Diabetes Daughter        medication induced for cancer treatments  . Cancer Daughter        breast, brain    ALLERGIES:  has No Known Allergies.  MEDICATIONS:  Current Outpatient Medications  Medication Sig Dispense Refill  . acetaminophen (TYLENOL) 325 MG tablet Take 325 mg by mouth every 6 (six) hours as needed.    Marland Kitchen acyclovir (ZOVIRAX) 400 MG tablet Take 1 tablet (400 mg total) 2 (two) times daily by mouth. 60 tablet 3  . amLODipine-benazepril (LOTREL) 5-20 MG capsule Take 1 capsule by mouth daily.    Marland Kitchen aspirin 81 MG chewable tablet Chew 81 mg by mouth daily.    Marland Kitchen atorvastatin (LIPITOR) 80 MG tablet Take 80 mg by mouth at bedtime.     . Baclofen 5 MG TABS Take 5 mg by mouth 3 (three) times daily as needed (hiccups). 21 tablet 0  . clopidogrel (PLAVIX) 75 MG  tablet Take 75 mg by mouth daily.    Marland Kitchen dexamethasone (DECADRON) 4 MG tablet Take 5 tablets (20 mg) on days 1, 8, and 15 of chemo. Repeat every 21 days. Carepack. 15 tablet 3  . fexofenadine (ALLEGRA) 180 MG tablet Take 180 mg by mouth daily.    . fluticasone (FLONASE) 50 MCG/ACT nasal spray Place 2 sprays into both nostrils daily as needed for rhinitis.     . Fluticasone Furoate-Vilanterol (BREO ELLIPTA) 100-25 MCG/INH AEPB Inhale 1 puff into the lungs daily.     . folic acid (FOLVITE) 1 MG tablet Take 1 mg by mouth daily.    . isosorbide mononitrate (IMDUR) 30 MG 24 hr tablet Take 30 mg by mouth daily.    Marland Kitchen lenalidomide (REVLIMID) 10 MG capsule Take 1 capsule (10 mg total) by mouth daily. Take one capsule daily on days 1-14 every 21 days. 14 capsule 0  . loperamide (IMODIUM) 2 MG capsule Take 1 capsule (2 mg total) by mouth See admin instructions. With onset of diarrhea, take 68m followed by 236mevery 2 hours resolved. Maximum: 16 mg/day 120 capsule 0  . metFORMIN (GLUCOPHAGE) 500 MG tablet Take 500 mg by mouth 2 (two) times daily with a meal.    . methotrexate (RHEUMATREX) 15 MG tablet Take 15 mg by mouth once a week. Caution: Chemotherapy. Protect from light.    . metoprolol succinate (TOPROL-XL) 50 MG 24 hr tablet Take 50 mg by mouth daily. Take with or immediately following a meal.    . ondansetron (ZOFRAN) 8 MG tablet Take 1 tablet (8 mg total) 2 (two) times daily as needed by mouth (Nausea or vomiting). 30 tablet 1  . pantoprazole (PROTONIX) 40 MG tablet Take 1 tablet (40 mg total) by mouth 2 (two) times daily. 60 tablet 3  . triamcinolone cream (KENALOG) 0.1 % Apply 1 application topically 2 (two) times daily.     No current facility-administered medications for this visit.       . Marland KitchenPHYSICAL EXAMINATION: ECOG PERFORMANCE STATUS: 1 - Symptomatic but completely ambulatory Vitals:   06/01/17 1444  BP: 129/76  Pulse: 90  Resp: 18  Temp: (!) 94.7 F (34.8 C)   Filed Weights    06/01/17 1444  Weight: 211 lb 14.4 oz (96.1 kg)   Physical Exam  Constitutional: He is oriented  to person, place, and time and well-developed, well-nourished, and in no distress. No distress.  HENT:  Head: Normocephalic.  Mouth/Throat: No oropharyngeal exudate.  Eyes: Conjunctivae and EOM are normal. Pupils are equal, round, and reactive to light. Right eye exhibits no discharge. No scleral icterus.  Neck: Normal range of motion. Neck supple. No JVD present. No thyromegaly present.  Cardiovascular: Normal rate, regular rhythm and normal heart sounds. Exam reveals no friction rub.  No murmur heard. Pulmonary/Chest: Effort normal and breath sounds normal. No respiratory distress. He has no rales.  Abdominal: Soft. Bowel sounds are normal. He exhibits no distension. There is no tenderness. There is no rebound.  Musculoskeletal: Normal range of motion. He exhibits no edema or deformity.  Bilateral ankle joint tenderness.   Lymphadenopathy:    He has no cervical adenopathy.  Neurological: He is alert and oriented to person, place, and time. No cranial nerve deficit.  Skin: Skin is dry. He is not diaphoretic. No erythema.  Psychiatric: Affect and judgment normal.    LABORATORY DATA:  I have reviewed the data as listed Lab Results  Component Value Date   WBC 11.3 (H) 05/25/2017   HGB 9.3 (L) 05/25/2017   HCT 30.8 (L) 05/25/2017   MCV 75.3 (L) 05/25/2017   PLT 185 05/25/2017   Recent Labs    05/11/17 0802 05/18/17 1320 05/20/17 1325 05/25/17 1254  NA 139 136 138 138  K 4.3 3.7 4.2 4.5  CL 108 106 108 109  CO2 23 20* 21* 22  GLUCOSE 130* 155* 109* 143*  BUN 17 24* 29* 14  CREATININE 1.33* 1.63* 1.30* 1.26*  CALCIUM 9.1 8.6* 8.7* 8.5*  GFRNONAA 51* 40* 52* 54*  GFRAA 59* 46* >60 >60  PROT 7.6 7.3  --  7.0  ALBUMIN 3.8 3.4*  --  3.3*  AST 19 18  --  31  ALT 14* 12*  --  38  ALKPHOS 80 58  --  67  BILITOT 0.4 0.5  --  0.5    SPEP: no M spike Serum free light chain  Kappa/Lamda ration 13.04 UPEP  Free light chain Kappa/lamda ratio 162.43, M spike 167m/24 hour   Bone marrow biopsy 03/21/2017  Bone Marrow, Aspirate,Biopsy, and Clot, left iliac - HYPERCELLULAR BONE MARROW FOR AGE WITH TRILINEAGE HEMATOPOIESIS. - PLASMACYTOSIS (PLASMA CELLS 8%)  Karyotype: loss of Y chromosome Cytogenetic MDS FISH Panel negative.   Bone marrow 04/06/2017  Bone Marrow, Aspirate,Biopsy, and Clot, right iliac and core - HYPERCELLULAR BONE MARROW FOR AGE WITH PLASMA CELL NEOPLASM - TRILINEAGE HEMATOPOIESIS. - SEE COMMENT. PERIPHERAL BLOOD: - MICROCYTIC-HYPOCHROMIC ANEMIA. Diagnosis Note The bone marrow is hypercellular with trilineage hematopoiesis but with relative abundance of erythroid precursors and increased number of megakaryocytes with nonspecific changes. Significant dyspoiesis is not seen. Iron stores are present with no ring sideroblasts. The plasma cells are increased in number representing 8% of all cells in the aspirate although focal areas in the core biopsy show 10 to 20% as primarily seen by CD138 stain. In situ hybridization for kappa and lambda light chains show kappa light chain restriction consistent with plasma cell neoplasm. Correlation with cytogenetic and FISH studies is recommended. (BNS:ecj/gt 04/07/2017)  IMAGE STUDIES I have personally reviewed below image results.  03/15/2017 DG bone survey Met: Nonspecific lucent lesion in the distal right ulna measuring 15-16 mm, but otherwise normal bone mineralization for age throughout the visible skeleton. A solitary lytic lesion in a distal extremity would be an unusual presentation of multiple myeloma,  and I favor the distal right ulna lesion is benign. Recommend correlation with serum and urine protein electrophoresis.  04/14/2017 PET scan: 1. 2.1 cm hypermetabolic soft tissue lesion in the left heel, adjacent to the calcaneal tuberosity. 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 2. Mottled FDG  accumulation diffusely in the marrow space without other frankly overt hypermetabolic bony lesion. 3. Coronary artery and thoracoabdominal aortic atherosclerosis with abdominal aortic stent graft visualized in situ. 4. Bilateral renal cysts and bilateral nonobstructing renal stones.    ASSESSMENT & PLAN:  1. Encounter for antineoplastic chemotherapy   2. Iron deficiency anemia, unspecified iron deficiency anemia type   3. Multiple myeloma, remission status unspecified ([place])    # Stage 1 Multiple Myeloma, Day 1 of cycle 2 RVD.  Revlimid 10 mg PO once per day on days 1 to 14 (dose reduced due to GFR) Revlimid renal dosing of 16m,  Velcade 1.3 mg/m2 IV once per day on days 1,  8,  Dexamethasone decreased to 20 mg PO once per day on days 1, 8, 15 given patient's diabetes. Patient reports that he has not get his Revlimid and dexamethasone shipment was told that medication will be shipped to him later today  # Diarrhea, advised patient to start taking Imodium as instructed. Check C. difficile toxin. If diarrhea does not improve patient knows that he needs to contact me. # Proceed Day 1 Velcade.   # Acute on chronic renal insufficiency: Kidney function improved after 2 sessions of hydration.   # Ankle pain: Resolved. Tylenol PRN  # advise patient to continue Aspirin 862mand Plavix  # Continue acyclovir for prophylaxis.   All questions were answered. The patient knows to call the clinic with any problems questions or concerns.  Return of visit: 1 week for weekly Velcade and reevaluation.  ZhEarlie ServerMD, PhD Hematology Oncology CoCumberland County Hospitalt AlGood Samaritan Medical Center LLCager- [phone]/26/2018

## 2017-06-01 ENCOUNTER — Other Ambulatory Visit: Payer: Self-pay

## 2017-06-01 ENCOUNTER — Inpatient Hospital Stay: Payer: Medicare Other

## 2017-06-01 ENCOUNTER — Inpatient Hospital Stay (HOSPITAL_BASED_OUTPATIENT_CLINIC_OR_DEPARTMENT_OTHER): Payer: Medicare Other | Admitting: Oncology

## 2017-06-01 ENCOUNTER — Encounter: Payer: Self-pay | Admitting: Oncology

## 2017-06-01 VITALS — BP 129/76 | HR 90 | Temp 94.7°F | Resp 18 | Wt 211.9 lb

## 2017-06-01 DIAGNOSIS — N182 Chronic kidney disease, stage 2 (mild): Secondary | ICD-10-CM | POA: Diagnosis not present

## 2017-06-01 DIAGNOSIS — C9000 Multiple myeloma not having achieved remission: Secondary | ICD-10-CM | POA: Diagnosis not present

## 2017-06-01 DIAGNOSIS — I129 Hypertensive chronic kidney disease with stage 1 through stage 4 chronic kidney disease, or unspecified chronic kidney disease: Secondary | ICD-10-CM | POA: Diagnosis not present

## 2017-06-01 DIAGNOSIS — D509 Iron deficiency anemia, unspecified: Secondary | ICD-10-CM

## 2017-06-01 DIAGNOSIS — Z8546 Personal history of malignant neoplasm of prostate: Secondary | ICD-10-CM | POA: Diagnosis not present

## 2017-06-01 DIAGNOSIS — D631 Anemia in chronic kidney disease: Secondary | ICD-10-CM | POA: Diagnosis not present

## 2017-06-01 DIAGNOSIS — F17210 Nicotine dependence, cigarettes, uncomplicated: Secondary | ICD-10-CM

## 2017-06-01 DIAGNOSIS — Z5111 Encounter for antineoplastic chemotherapy: Secondary | ICD-10-CM | POA: Diagnosis not present

## 2017-06-01 DIAGNOSIS — Z79899 Other long term (current) drug therapy: Secondary | ICD-10-CM | POA: Diagnosis not present

## 2017-06-01 DIAGNOSIS — E860 Dehydration: Secondary | ICD-10-CM

## 2017-06-01 DIAGNOSIS — R197 Diarrhea, unspecified: Secondary | ICD-10-CM | POA: Diagnosis not present

## 2017-06-01 LAB — COMPREHENSIVE METABOLIC PANEL
ALBUMIN: 3.2 g/dL — AB (ref 3.5–5.0)
ALK PHOS: 88 U/L (ref 38–126)
ALT: 22 U/L (ref 17–63)
ANION GAP: 7 (ref 5–15)
AST: 13 U/L — AB (ref 15–41)
BILIRUBIN TOTAL: 0.3 mg/dL (ref 0.3–1.2)
BUN: 24 mg/dL — AB (ref 6–20)
CALCIUM: 8.2 mg/dL — AB (ref 8.9–10.3)
CO2: 21 mmol/L — ABNORMAL LOW (ref 22–32)
CREATININE: 1.26 mg/dL — AB (ref 0.61–1.24)
Chloride: 112 mmol/L — ABNORMAL HIGH (ref 101–111)
GFR calc Af Amer: >60 mL/min (ref >60)
GFR calc non Af Amer: 54 mL/min — ABNORMAL LOW (ref >60)
GLUCOSE: 90 mg/dL (ref 65–99)
Potassium: 4.3 mmol/L (ref 3.5–5.1)
Sodium: 140 mmol/L (ref 135–145)
TOTAL PROTEIN: 6.7 g/dL (ref 6.5–8.1)

## 2017-06-01 LAB — CBC WITH DIFFERENTIAL/PLATELET
BASOS ABS: 0.1 10*3/uL (ref 0–0.1)
BASOS PCT: 1 %
EOS PCT: 4 %
Eosinophils Absolute: 0.3 10*3/uL (ref 0–0.7)
HCT: 30.3 % — ABNORMAL LOW (ref 40.0–52.0)
Hemoglobin: 9.2 g/dL — ABNORMAL LOW (ref 13.0–18.0)
Lymphocytes Relative: 14 %
Lymphs Abs: 1.3 10*3/uL (ref 1.0–3.6)
MCH: 22.8 pg — ABNORMAL LOW (ref 26.0–34.0)
MCHC: 30.2 g/dL — ABNORMAL LOW (ref 32.0–36.0)
MCV: 75.6 fL — ABNORMAL LOW (ref 80.0–100.0)
MONO ABS: 1 10*3/uL (ref 0.2–1.0)
Monocytes Relative: 10 %
Neutro Abs: 6.9 10*3/uL — ABNORMAL HIGH (ref 1.4–6.5)
Neutrophils Relative %: 71 %
PLATELETS: 320 10*3/uL (ref 150–440)
RBC: 4.01 MIL/uL — ABNORMAL LOW (ref 4.40–5.90)
RDW: 27 % — AB (ref 11.5–14.5)
WBC: 9.6 10*3/uL (ref 3.8–10.6)

## 2017-06-01 MED ORDER — BORTEZOMIB CHEMO SQ INJECTION 3.5 MG (2.5MG/ML)
1.3000 mg/m2 | Freq: Once | INTRAMUSCULAR | Status: AC
  Administered 2017-06-01: 2.75 mg via SUBCUTANEOUS
  Filled 2017-06-01: qty 2.75

## 2017-06-01 MED ORDER — PROCHLORPERAZINE MALEATE 10 MG PO TABS
10.0000 mg | ORAL_TABLET | Freq: Once | ORAL | Status: AC
  Administered 2017-06-01: 10 mg via ORAL
  Filled 2017-06-01: qty 1

## 2017-06-01 NOTE — Progress Notes (Signed)
Here for follow up-has been feeling weak w low energy and has had diarrhea stools several days-worse x 2 days -didn't have meds as he was out of town. Will start anti diarrhea meds today he stated.

## 2017-06-02 ENCOUNTER — Other Ambulatory Visit: Payer: Self-pay | Admitting: *Deleted

## 2017-06-02 DIAGNOSIS — R197 Diarrhea, unspecified: Secondary | ICD-10-CM

## 2017-06-02 LAB — PROTEIN ELECTROPHORESIS, SERUM
A/G Ratio: 1 (ref 0.7–1.7)
ALPHA-1-GLOBULIN: 0.3 g/dL (ref 0.0–0.4)
ALPHA-2-GLOBULIN: 0.8 g/dL (ref 0.4–1.0)
Albumin ELP: 3.1 g/dL (ref 2.9–4.4)
Beta Globulin: 1 g/dL (ref 0.7–1.3)
GLOBULIN, TOTAL: 3 g/dL (ref 2.2–3.9)
Gamma Globulin: 1 g/dL (ref 0.4–1.8)
Total Protein ELP: 6.1 g/dL (ref 6.0–8.5)

## 2017-06-03 ENCOUNTER — Telehealth: Payer: Self-pay | Admitting: *Deleted

## 2017-06-03 NOTE — Telephone Encounter (Signed)
Spoke with patient about coming in to the lab to get c-diff test, Pt states that he has taken imodium and no longer has diarrhea.   Advised to let us know if it returns and we will get him in for the test.   While on the phone patient said that since he received his mail order of Revlimd late on 07/02/16, so he was unable to take that days dose until about 5:00, instead of 12:00 like he was taking before. Patient says that he is not sleeping, and feels that the change in the time he takes the medication is the cause.  Advised him to try Melatonin and he can adjust the time he takes his medication to around 12 as before.

## 2017-06-07 NOTE — Progress Notes (Signed)
Hematology/Oncology Follow Up Note [facility] Telephone:([phone]) [phone] [phone]  Patient Care Team: Jodi Marble, MD as PCP - General (Internal Medicine) Bary Castilla, Forest Gleason, MD (General Surgery) Edrick Kins, MD as Rounding Team (Internal Medicine) Hillary Bow, MD as Consulting Physician (Internal Medicine) [name], MD as Surgeon (Gastroenterology) Isaias Cowman, MD as Consulting Physician (Cardiology)  REFERRING PROVIDER: Dr.Lateef, [place] CHIEF COMPLAINTS/REASON FOR VISIT  Abnormal UPEP results, follow up for evaluation of monoclonal gammopathy  HISTORY OF PRESENTING ILLNESS:  77 y.o.  male with PMH listed below who presents to follow up on the evaluation and management of his abnormal urine protein electrophoresis results. I reviewed the records from [facility], [place] and [place] was performed and HemOnc related medical problems are listed below.  He lives with a friend. He has 3 adult children.   1 Chronic Kidney disease, Stage II: patient follows up with Dr.Lateef.  2 Proteinuria:  02/18/2017: Albumin/Creatinine ratio was 7, Urine protein electrophoresis,Random Urine revealed M Spike 43.2%, total protein of 43.107m/dl,  01/05/2017 Albumin/Creatinine ratio was 455.2, Albumin 996.5, , 2.15.2018 Albumin/Creatinine ratio was 80.3, Albumin 73.3, Urine protein electrophoresis,Random Urine revealed M Spike 29.7%, total protein of 29.132mdl, 06/30/2016 Serum protein electrophoresis did not detect M spike.  Autoimmune disorder work up showed Negative anti dsDNA, RNP antibodies, smith antibody, Sjogren antibodies, anti Jo-1, positive antichromotin antibodies, positive ANA,  3 Anemia of chronic kidney disease:  4 Iron deficiency anemia: history of iron deficiency, ferritin was 7 on 08/11/2016. He had EGD and colonoscopy that were done this year which showed  duodenitis/esphagitis/diverticulosis/benigh polyps. 5 Rheumatoid arthritis: he follows up with Dr.Kernodle and is on chronic MTX. He reports joint pains are controlled, not quite symptomatic.   Patient denies any persistent bone pain or any pain. He continue to feel lack of energy, persistent, not improved with resting or taking naps. He also has lost 25 pounds in the past year which was unintentional.   # Rheumatoid arthritis and currently is off MTX.  Current Treatment: RVD  INTERVAL HISTORY Patient presents for follow up for management of multiple myeloma. Patient is starting day 8 of second cycle VRD. He reports energy levels are better.  Patient denies any diarrhea after he was seen last. Denies any numbness or tingling.    Review of Systems  Constitutional: Negative for appetite change and fatigue.  HENT:   Negative for hearing loss, lump/mass and sore throat.   Eyes: Negative for eye problems.  Respiratory: Negative for chest tightness, cough and hemoptysis.   Cardiovascular: Negative for chest pain.  Gastrointestinal: Negative for abdominal distention and diarrhea.  Endocrine: Negative for hot flashes.  Genitourinary: Negative for difficulty urinating, dyspareunia and frequency.   Musculoskeletal: Negative for arthralgias, back pain and gait problem.  Skin: Negative for itching and rash.  Neurological: Negative for dizziness, gait problem and seizures.  Hematological: Negative for adenopathy. Does not bruise/bleed easily.  Psychiatric/Behavioral: Negative for confusion and depression. The patient is not nervous/anxious.      MEDICAL HISTORY:  Past Medical History:  Diagnosis Date  . Anxiety   . Arthritis    rheumatoid  . Atrial fibrillation (HCC)    hx of  . Cancer ([place]  . Chronic kidney disease   . Colon polyp 07-07-15   TUBULAR ADENOMA WITH AT LEAST HIGH-GRADE / Dr ReRayann Heman. Diabetes mellitus without complication [place]  . Dysrhythmia    bradycardia.....Marland KitchenMarland Kitchento 1  heart block  .  GERD (gastroesophageal reflux disease)   . Heart murmur    patient unaware of history of murmur  . Hypercholesterolemia   . Hypertension   . Presence of permanent cardiac pacemaker   . Prostate cancer ([place]) 01/01/13, 01/30/14   Gleason 3+4=7, volume 46.6 cc  . Rheumatoid arthritis ([place])   . S/P radiation therapy  04/03/2014 through 06/04/2014                                                      Prostate 7800 cGy in 40 sessions                          . Sleep apnea    uses cpap but it is not in the hospital with him    SURGICAL HISTORY: Past Surgical History:  Procedure Laterality Date  . ABDOMINAL AORTIC ANEURYSM REPAIR  11/2013  . COLON SURGERY  March 2017   Right hemicolectomy for tubulovillous adenoma with high-grade dysplasia.  . COLONOSCOPY WITH PROPOFOL N/A 07/07/2015   Procedure: COLONOSCOPY WITH PROPOFOL;  Surgeon: Josefine Class, MD;  Location: [facility] ENDOSCOPY;  Service: Endoscopy;  Laterality: N/A;  . COLONOSCOPY WITH PROPOFOL N/A 10/28/2016   Procedure: COLONOSCOPY WITH PROPOFOL;  Surgeon: [name], MD;  Location: [facility] ENDOSCOPY;  Service: Endoscopy;  Laterality: N/A;  . ESOPHAGOGASTRODUODENOSCOPY (EGD) WITH PROPOFOL N/A 10/28/2016   Procedure: ESOPHAGOGASTRODUODENOSCOPY (EGD) WITH PROPOFOL;  Surgeon: [name], MD;  Location: [facility] ENDOSCOPY;  Service: Endoscopy;  Laterality: N/A;  . LAPAROSCOPIC RIGHT COLECTOMY Right 08/08/2015   Procedure: LAPAROSCOPIC RIGHT COLECTOMY;  Surgeon: Robert Bellow, MD;  Location: [facility] ORS;  Service: General;  Laterality: Right;  . NASAL SINUS SURGERY    . PACEMAKER INSERTION Left 08/26/2016   Procedure: INSERTION PACEMAKER;  Surgeon: Isaias Cowman, MD;  Location: [facility] ORS;  Service: Cardiovascular;  Laterality: Left;  . PROSTATE BIOPSY  01/01/13, 01/30/14   Gleason 3+3=6, vol 46.6 cc  . TONSILLECTOMY    . uvula surgery     for sleep apnea    SOCIAL HISTORY: Social History   Socioeconomic History  . Marital  status: Divorced    Spouse name: Not on file  . Number of children: Not on file  . Years of education: Not on file  . Highest education level: Not on file  Social Needs  . Financial resource strain: Not on file  . Food insecurity - worry: Not on file  . Food insecurity - inability: Not on file  . Transportation needs - medical: Not on file  . Transportation needs - non-medical: Not on file  Occupational History  . Not on file  Tobacco Use  . Smoking status: Current Every Day Smoker    Packs/day: 0.50    Years: 50.00    Pack years: 25.00    Types: Cigarettes  . Smokeless tobacco: Never Used  . Tobacco comment: DOWN TO 1/2 PPD  Substance and Sexual Activity  . Alcohol use: Yes    Alcohol/week: 0.6 oz    Types: 1 Cans of beer per week    Comment: occasionally  . Drug use: No  . Sexual activity: Not on file  Other Topics Concern  . Not on file  Social History Narrative  . Not on file    FAMILY HISTORY: Family History  Problem  Relation Age of Onset  . Heart attack Mother   . Cirrhosis Father   . Pancreatic cancer Brother   . Diabetes Brother   . Diabetes Daughter        medication induced for cancer treatments  . Cancer Daughter        breast, brain    ALLERGIES:  has No Known Allergies.  MEDICATIONS:  Current Outpatient Medications  Medication Sig Dispense Refill  . acetaminophen (TYLENOL) 325 MG tablet Take 325 mg by mouth every 6 (six) hours as needed.    Marland Kitchen acyclovir (ZOVIRAX) 400 MG tablet Take 1 tablet (400 mg total) 2 (two) times daily by mouth. 60 tablet 3  . amLODipine-benazepril (LOTREL) 5-20 MG capsule Take 1 capsule by mouth daily.    Marland Kitchen aspirin 81 MG chewable tablet Chew 81 mg by mouth daily.    Marland Kitchen atorvastatin (LIPITOR) 80 MG tablet Take 80 mg by mouth at bedtime.     . Baclofen 5 MG TABS Take 5 mg by mouth 3 (three) times daily as needed (hiccups). (Patient not taking: Reported on 06/01/2017) 21 tablet 0  . clopidogrel (PLAVIX) 75 MG tablet Take 75 mg  by mouth daily.    Marland Kitchen dexamethasone (DECADRON) 4 MG tablet Take 5 tablets (20 mg) on days 1, 8, and 15 of chemo. Repeat every 21 days. Carepack. (Patient not taking: Reported on 06/01/2017) 15 tablet 3  . fexofenadine (ALLEGRA) 180 MG tablet Take 180 mg by mouth daily.    . fluticasone (FLONASE) 50 MCG/ACT nasal spray Place 2 sprays into both nostrils daily as needed for rhinitis.     . Fluticasone Furoate-Vilanterol (BREO ELLIPTA) 100-25 MCG/INH AEPB Inhale 1 puff into the lungs daily.     . folic acid (FOLVITE) 1 MG tablet Take 1 mg by mouth daily.    . isosorbide mononitrate (IMDUR) 30 MG 24 hr tablet Take 30 mg by mouth daily.    Marland Kitchen lenalidomide (REVLIMID) 10 MG capsule Take 1 capsule (10 mg total) by mouth daily. Take one capsule daily on days 1-14 every 21 days. (Patient not taking: Reported on 06/01/2017) 14 capsule 0  . loperamide (IMODIUM) 2 MG capsule Take 1 capsule (2 mg total) by mouth See admin instructions. With onset of diarrhea, take 53m followed by 252mevery 2 hours resolved. Maximum: 16 mg/day (Patient not taking: Reported on 06/01/2017) 120 capsule 0  . metFORMIN (GLUCOPHAGE) 500 MG tablet Take 500 mg by mouth 2 (two) times daily with a meal.    . metoprolol succinate (TOPROL-XL) 50 MG 24 hr tablet Take 50 mg by mouth daily. Take with or immediately following a meal.    . ondansetron (ZOFRAN) 8 MG tablet Take 1 tablet (8 mg total) 2 (two) times daily as needed by mouth (Nausea or vomiting). (Patient not taking: Reported on 06/01/2017) 30 tablet 1  . pantoprazole (PROTONIX) 40 MG tablet Take 1 tablet (40 mg total) by mouth 2 (two) times daily. 60 tablet 3  . triamcinolone cream (KENALOG) 0.1 % Apply 1 application topically 2 (two) times daily.     No current facility-administered medications for this visit.       . Marland KitchenPHYSICAL EXAMINATION: ECOG PERFORMANCE STATUS: 1 - Symptomatic but completely ambulatory Vitals:   06/08/17 1512  BP: 122/72  Pulse: 82  Resp: 18  Temp: (!)  95.3 F (35.2 C)   Filed Weights   06/08/17 1512  Weight: 216 lb 4.8 oz (98.1 kg)   Physical Exam  Constitutional: He  is oriented to person, place, and time and well-developed, well-nourished, and in no distress. No distress.  HENT:  Head: Normocephalic.  Mouth/Throat: No oropharyngeal exudate.  Eyes: Conjunctivae and EOM are normal. Pupils are equal, round, and reactive to light. Right eye exhibits no discharge. No scleral icterus.  Neck: Normal range of motion. Neck supple. No JVD present. No thyromegaly present.  Cardiovascular: Normal rate, regular rhythm and normal heart sounds. Exam reveals no friction rub.  No murmur heard. Pulmonary/Chest: Effort normal and breath sounds normal. No respiratory distress. He has no rales. He exhibits no tenderness.  Abdominal: Soft. Bowel sounds are normal. He exhibits no distension. There is no tenderness. There is no rebound.  Musculoskeletal: Normal range of motion. He exhibits no edema, tenderness or deformity.  Bilateral ankle joint tenderness.   Lymphadenopathy:    He has no cervical adenopathy.  Neurological: He is alert and oriented to person, place, and time. No cranial nerve deficit. Coordination normal.  Skin: Skin is dry. No rash noted. He is not diaphoretic. No erythema.  Psychiatric: Affect and judgment normal.    LABORATORY DATA:  I have reviewed the data as listed Lab Results  Component Value Date   WBC 7.8 06/08/2017   HGB 8.6 (L) 06/08/2017   HCT 28.1 (L) 06/08/2017   MCV 74.0 (L) 06/08/2017   PLT 279 06/08/2017   Recent Labs    05/25/17 1254 06/01/17 1424 06/08/17 1410  NA 138 140 140  K 4.5 4.3 4.0  CL 109 112* 110  CO2 22 21* 21*  GLUCOSE 143* 90 97  BUN 14 24* 19  CREATININE 1.26* 1.26* 1.36*  CALCIUM 8.5* 8.2* 8.6*  GFRNONAA 54* 54* 49*  GFRAA >60 >60 57*  PROT 7.0 6.7 6.4*  ALBUMIN 3.3* 3.2* 3.3*  AST 31 13* 15  ALT 38 22 18  ALKPHOS 67 88 64  BILITOT 0.5 0.3 0.5    SPEP: no M spike Serum free  light chain Kappa/Lamda ration 13.04 UPEP  Free light chain Kappa/lamda ratio 162.43, M spike 118m/24 hour   Bone marrow biopsy 03/21/2017  Bone Marrow, Aspirate,Biopsy, and Clot, left iliac - HYPERCELLULAR BONE MARROW FOR AGE WITH TRILINEAGE HEMATOPOIESIS. - PLASMACYTOSIS (PLASMA CELLS 8%)  Karyotype: loss of Y chromosome Cytogenetic MDS FISH Panel negative.   Bone marrow 04/06/2017  Bone Marrow, Aspirate,Biopsy, and Clot, right iliac and core - HYPERCELLULAR BONE MARROW FOR AGE WITH PLASMA CELL NEOPLASM - TRILINEAGE HEMATOPOIESIS. - SEE COMMENT. PERIPHERAL BLOOD: - MICROCYTIC-HYPOCHROMIC ANEMIA. Diagnosis Note The bone marrow is hypercellular with trilineage hematopoiesis but with relative abundance of erythroid precursors and increased number of megakaryocytes with nonspecific changes. Significant dyspoiesis is not seen. Iron stores are present with no ring sideroblasts. The plasma cells are increased in number representing 8% of all cells in the aspirate although focal areas in the core biopsy show 10 to 20% as primarily seen by CD138 stain. In situ hybridization for kappa and lambda light chains show kappa light chain restriction consistent with plasma cell neoplasm. Correlation with cytogenetic and FISH studies is recommended. (BNS:ecj/gt 04/07/2017)  IMAGE STUDIES I have personally reviewed below image results.  03/15/2017 DG bone survey Met: Nonspecific lucent lesion in the distal right ulna measuring 15-16 mm, but otherwise normal bone mineralization for age throughout the visible skeleton. A solitary lytic lesion in a distal extremity would be an unusual presentation of multiple myeloma, and I favor the distal right ulna lesion is benign. Recommend correlation with serum and urine protein electrophoresis.  04/14/2017 PET scan: 1. 2.1 cm hypermetabolic soft tissue lesion in the left heel, adjacent to the calcaneal tuberosity. 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 2.  Mottled FDG accumulation diffusely in the marrow space without other frankly overt hypermetabolic bony lesion. 3. Coronary artery and thoracoabdominal aortic atherosclerosis with abdominal aortic stent graft visualized in situ. 4. Bilateral renal cysts and bilateral nonobstructing renal stones.    ASSESSMENT & PLAN:  1. Multiple myeloma, remission status unspecified ([place])   2. Iron deficiency anemia due to chronic blood loss   3. CKD (chronic kidney disease) stage 3, GFR 30-59 ml/min (HCC)   4. SOB (shortness of breath) on exertion    # light chain Multiple Myeloma, Day 8 of cycle 2 RVD Revlimid 10 mg PO once per day on days 1 to 14 (dose reduced due to GFR) Revlimid renal dosing of 31m,  Velcade 1.3 mg/m2 IV once per day on days 1,  8, 15 Dexamethasone decreased to 20 mg PO once per day on days 1, 8, 15 given patient's diabetes. Patient appears tolerating regimen well. No neuropathy.   # Proceed Day 8 Velcade.  # Light chain ratio was checked prior to cycle 2 and results pending.  # chronic renal insufficiency stage 3,  Kidney function flucturates. Will give him 1L NS fluid for hydration.   # advise patient to continue Aspirin 853mand Plavix  # Continue acyclovir for prophylaxis.  # Xgeva or zometa once he obtained dental clearance. # [facility].  # Still have microcytic anemia, repeat iron panel to see if he will benefit additional IV iron. Iron panel reviewed, consistent with iron deficiency. Will schedule him for addition IV Venofer weekly x 4   # I was called by RN reporting that patient mentioned he has been having SOB with exertion lately. No chest discomfort or pressure or pain. He has cardiology appointment tomorrow.  His physical examination is not consistent with fluid overload. No crackles or leg edema. Anemia can potentially cause more SOB, will give him additional IV iron.  He will also  discuss with cardiology to see if cardiology etiology. He will ask cardiologist to give me a call.   All questions were answered. The patient knows to call the clinic with any problems questions or concerns.  Return of visit: 1 week for weekly Velcade and reevaluation.  ZhEarlie ServerMD, PhD Hematology Oncology [facility]- [phone]/07/2017

## 2017-06-08 ENCOUNTER — Inpatient Hospital Stay: Payer: Medicare Other

## 2017-06-08 ENCOUNTER — Inpatient Hospital Stay: Payer: Medicare Other | Attending: Oncology | Admitting: Oncology

## 2017-06-08 ENCOUNTER — Other Ambulatory Visit: Payer: Self-pay

## 2017-06-08 ENCOUNTER — Other Ambulatory Visit: Payer: Self-pay | Admitting: *Deleted

## 2017-06-08 VITALS — BP 122/72 | HR 82 | Temp 95.3°F | Resp 18 | Wt 216.3 lb

## 2017-06-08 DIAGNOSIS — Z79899 Other long term (current) drug therapy: Secondary | ICD-10-CM | POA: Insufficient documentation

## 2017-06-08 DIAGNOSIS — M069 Rheumatoid arthritis, unspecified: Secondary | ICD-10-CM | POA: Diagnosis not present

## 2017-06-08 DIAGNOSIS — D631 Anemia in chronic kidney disease: Secondary | ICD-10-CM | POA: Insufficient documentation

## 2017-06-08 DIAGNOSIS — D500 Iron deficiency anemia secondary to blood loss (chronic): Secondary | ICD-10-CM

## 2017-06-08 DIAGNOSIS — D509 Iron deficiency anemia, unspecified: Secondary | ICD-10-CM | POA: Insufficient documentation

## 2017-06-08 DIAGNOSIS — Z923 Personal history of irradiation: Secondary | ICD-10-CM | POA: Diagnosis not present

## 2017-06-08 DIAGNOSIS — N183 Chronic kidney disease, stage 3 unspecified: Secondary | ICD-10-CM

## 2017-06-08 DIAGNOSIS — E119 Type 2 diabetes mellitus without complications: Secondary | ICD-10-CM | POA: Insufficient documentation

## 2017-06-08 DIAGNOSIS — Z5112 Encounter for antineoplastic immunotherapy: Secondary | ICD-10-CM | POA: Diagnosis present

## 2017-06-08 DIAGNOSIS — I4891 Unspecified atrial fibrillation: Secondary | ICD-10-CM | POA: Diagnosis not present

## 2017-06-08 DIAGNOSIS — F17210 Nicotine dependence, cigarettes, uncomplicated: Secondary | ICD-10-CM | POA: Diagnosis not present

## 2017-06-08 DIAGNOSIS — Z8546 Personal history of malignant neoplasm of prostate: Secondary | ICD-10-CM | POA: Diagnosis not present

## 2017-06-08 DIAGNOSIS — C9000 Multiple myeloma not having achieved remission: Secondary | ICD-10-CM

## 2017-06-08 DIAGNOSIS — Z950 Presence of cardiac pacemaker: Secondary | ICD-10-CM | POA: Insufficient documentation

## 2017-06-08 DIAGNOSIS — I129 Hypertensive chronic kidney disease with stage 1 through stage 4 chronic kidney disease, or unspecified chronic kidney disease: Secondary | ICD-10-CM | POA: Insufficient documentation

## 2017-06-08 DIAGNOSIS — R0602 Shortness of breath: Secondary | ICD-10-CM

## 2017-06-08 LAB — CBC WITH DIFFERENTIAL/PLATELET
Basophils Absolute: 0.1 10*3/uL (ref 0–0.1)
Basophils Relative: 1 %
EOS PCT: 6 %
Eosinophils Absolute: 0.5 10*3/uL (ref 0–0.7)
HEMATOCRIT: 28.1 % — AB (ref 40.0–52.0)
Hemoglobin: 8.6 g/dL — ABNORMAL LOW (ref 13.0–18.0)
LYMPHS ABS: 1.1 10*3/uL (ref 1.0–3.6)
Lymphocytes Relative: 14 %
MCH: 22.6 pg — ABNORMAL LOW (ref 26.0–34.0)
MCHC: 30.6 g/dL — ABNORMAL LOW (ref 32.0–36.0)
MCV: 74 fL — AB (ref 80.0–100.0)
MONO ABS: 0.5 10*3/uL (ref 0.2–1.0)
Monocytes Relative: 6 %
Neutro Abs: 5.6 10*3/uL (ref 1.4–6.5)
Neutrophils Relative %: 73 %
Platelets: 279 10*3/uL (ref 150–440)
RBC: 3.8 MIL/uL — ABNORMAL LOW (ref 4.40–5.90)
RDW: 26.9 % — AB (ref 11.5–14.5)
Smear Review: ADEQUATE
WBC: 7.8 10*3/uL (ref 3.8–10.6)

## 2017-06-08 LAB — IRON AND TIBC
IRON: 14 ug/dL — AB (ref 45–182)
SATURATION RATIOS: 4 % — AB (ref 17.9–39.5)
TIBC: 398 ug/dL (ref 250–450)
UIBC: 384 ug/dL

## 2017-06-08 LAB — COMPREHENSIVE METABOLIC PANEL
ALK PHOS: 64 U/L (ref 38–126)
ALT: 18 U/L (ref 17–63)
AST: 15 U/L (ref 15–41)
Albumin: 3.3 g/dL — ABNORMAL LOW (ref 3.5–5.0)
Anion gap: 9 (ref 5–15)
BUN: 19 mg/dL (ref 6–20)
CALCIUM: 8.6 mg/dL — AB (ref 8.9–10.3)
CHLORIDE: 110 mmol/L (ref 101–111)
CO2: 21 mmol/L — ABNORMAL LOW (ref 22–32)
CREATININE: 1.36 mg/dL — AB (ref 0.61–1.24)
GFR calc Af Amer: 57 mL/min — ABNORMAL LOW (ref >60)
GFR, EST NON AFRICAN AMERICAN: 49 mL/min — AB (ref >60)
Glucose, Bld: 97 mg/dL (ref 65–99)
Potassium: 4 mmol/L (ref 3.5–5.1)
Sodium: 140 mmol/L (ref 135–145)
Total Bilirubin: 0.5 mg/dL (ref 0.3–1.2)
Total Protein: 6.4 g/dL — ABNORMAL LOW (ref 6.5–8.1)

## 2017-06-08 LAB — FERRITIN: FERRITIN: 49 ng/mL (ref 24–336)

## 2017-06-08 MED ORDER — SODIUM CHLORIDE 0.9 % IV SOLN
Freq: Once | INTRAVENOUS | Status: AC
  Administered 2017-06-08: 16:00:00 via INTRAVENOUS
  Filled 2017-06-08: qty 1000

## 2017-06-08 MED ORDER — HEPARIN SOD (PORK) LOCK FLUSH 100 UNIT/ML IV SOLN: 500.0000 [IU] | Freq: Once | INTRAVENOUS | Status: DC | PRN

## 2017-06-08 MED ORDER — BORTEZOMIB CHEMO SQ INJECTION 3.5 MG (2.5MG/ML)
1.3000 mg/m2 | Freq: Once | INTRAMUSCULAR | Status: AC
  Administered 2017-06-08: 2.75 mg via SUBCUTANEOUS
  Filled 2017-06-08: qty 2.75

## 2017-06-08 MED ORDER — PROCHLORPERAZINE MALEATE 10 MG PO TABS
10.0000 mg | ORAL_TABLET | Freq: Once | ORAL | Status: AC
  Administered 2017-06-08: 10 mg via ORAL
  Filled 2017-06-08: qty 1

## 2017-06-08 NOTE — Progress Notes (Signed)
Here for follow up. Stated he feels "alright " .

## 2017-06-08 NOTE — Progress Notes (Signed)
While giving ordered NS bolus, SOB was noticed while patient was trying to talk. Patient mentioned that he has been SOB for the past week. Pulse ox was 93% on room air and lungs clear. Notified Dr. Tasia Catchings and she came to assess him. Patient will possibly return tomorrow for type and screen for blood transfusion after meeting with cardiologist.

## 2017-06-09 ENCOUNTER — Encounter: Payer: Self-pay | Admitting: Oncology

## 2017-06-09 ENCOUNTER — Other Ambulatory Visit: Payer: Self-pay | Admitting: *Deleted

## 2017-06-09 DIAGNOSIS — D509 Iron deficiency anemia, unspecified: Secondary | ICD-10-CM

## 2017-06-10 ENCOUNTER — Inpatient Hospital Stay: Payer: Medicare Other

## 2017-06-10 VITALS — BP 104/66 | HR 60 | Temp 95.6°F | Resp 18

## 2017-06-10 DIAGNOSIS — Z5112 Encounter for antineoplastic immunotherapy: Secondary | ICD-10-CM | POA: Diagnosis not present

## 2017-06-10 DIAGNOSIS — D509 Iron deficiency anemia, unspecified: Secondary | ICD-10-CM

## 2017-06-10 MED ORDER — IRON SUCROSE 20 MG/ML IV SOLN
200.0000 mg | Freq: Once | INTRAVENOUS | Status: AC
  Administered 2017-06-10: 200 mg via INTRAVENOUS
  Filled 2017-06-10: qty 10

## 2017-06-10 MED ORDER — SODIUM CHLORIDE 0.9 % IV SOLN
Freq: Once | INTRAVENOUS | Status: AC
  Administered 2017-06-10: 15:00:00 via INTRAVENOUS
  Filled 2017-06-10: qty 1000

## 2017-06-10 NOTE — Patient Instructions (Signed)

## 2017-06-15 ENCOUNTER — Inpatient Hospital Stay: Payer: Medicare Other

## 2017-06-15 ENCOUNTER — Telehealth: Payer: Self-pay | Admitting: Pharmacist

## 2017-06-15 ENCOUNTER — Other Ambulatory Visit: Payer: Self-pay | Admitting: *Deleted

## 2017-06-15 ENCOUNTER — Inpatient Hospital Stay: Payer: Medicare Other | Admitting: *Deleted

## 2017-06-15 ENCOUNTER — Inpatient Hospital Stay (HOSPITAL_BASED_OUTPATIENT_CLINIC_OR_DEPARTMENT_OTHER): Payer: Medicare Other | Admitting: Oncology

## 2017-06-15 ENCOUNTER — Encounter: Payer: Self-pay | Admitting: Oncology

## 2017-06-15 VITALS — BP 104/68 | HR 79 | Temp 97.1°F | Resp 22 | Wt 226.0 lb

## 2017-06-15 DIAGNOSIS — D509 Iron deficiency anemia, unspecified: Secondary | ICD-10-CM

## 2017-06-15 DIAGNOSIS — R0602 Shortness of breath: Secondary | ICD-10-CM

## 2017-06-15 DIAGNOSIS — C9000 Multiple myeloma not having achieved remission: Secondary | ICD-10-CM

## 2017-06-15 DIAGNOSIS — N183 Chronic kidney disease, stage 3 unspecified: Secondary | ICD-10-CM

## 2017-06-15 DIAGNOSIS — D631 Anemia in chronic kidney disease: Secondary | ICD-10-CM | POA: Diagnosis not present

## 2017-06-15 DIAGNOSIS — D649 Anemia, unspecified: Secondary | ICD-10-CM

## 2017-06-15 DIAGNOSIS — Z5111 Encounter for antineoplastic chemotherapy: Secondary | ICD-10-CM

## 2017-06-15 DIAGNOSIS — Z5112 Encounter for antineoplastic immunotherapy: Secondary | ICD-10-CM | POA: Diagnosis not present

## 2017-06-15 LAB — COMPREHENSIVE METABOLIC PANEL
ALK PHOS: 62 U/L (ref 38–126)
ALT: 21 U/L (ref 17–63)
AST: 15 U/L (ref 15–41)
Albumin: 3 g/dL — ABNORMAL LOW (ref 3.5–5.0)
Anion gap: 7 (ref 5–15)
BILIRUBIN TOTAL: 0.5 mg/dL (ref 0.3–1.2)
BUN: 24 mg/dL — AB (ref 6–20)
CALCIUM: 8.3 mg/dL — AB (ref 8.9–10.3)
CHLORIDE: 110 mmol/L (ref 101–111)
CO2: 24 mmol/L (ref 22–32)
CREATININE: 1.57 mg/dL — AB (ref 0.61–1.24)
GFR, EST AFRICAN AMERICAN: 48 mL/min — AB (ref >60)
GFR, EST NON AFRICAN AMERICAN: 41 mL/min — AB (ref >60)
Glucose, Bld: 75 mg/dL (ref 65–99)
Potassium: 4.3 mmol/L (ref 3.5–5.1)
Sodium: 141 mmol/L (ref 135–145)
Total Protein: 6.2 g/dL — ABNORMAL LOW (ref 6.5–8.1)

## 2017-06-15 LAB — CBC WITH DIFFERENTIAL/PLATELET
BASOS PCT: 1 %
Band Neutrophils: 8 %
Basophils Absolute: 0.1 10*3/uL (ref 0–0.1)
Blasts: 0 %
Eosinophils Absolute: 0.2 10*3/uL (ref 0–0.7)
Eosinophils Relative: 2 %
HEMATOCRIT: 25.9 % — AB (ref 40.0–52.0)
HEMOGLOBIN: 7.9 g/dL — AB (ref 13.0–18.0)
LYMPHS PCT: 12 %
Lymphs Abs: 1.1 10*3/uL (ref 1.0–3.6)
MCH: 23.3 pg — AB (ref 26.0–34.0)
MCHC: 30.7 g/dL — ABNORMAL LOW (ref 32.0–36.0)
MCV: 75.7 fL — AB (ref 80.0–100.0)
MONO ABS: 0.9 10*3/uL (ref 0.2–1.0)
MYELOCYTES: 0 %
Metamyelocytes Relative: 0 %
Monocytes Relative: 10 %
NEUTROS PCT: 67 %
NRBC: 1 /100{WBCs} — AB
Neutro Abs: 6.6 10*3/uL — ABNORMAL HIGH (ref 1.4–6.5)
OTHER: 0 %
PLATELETS: 144 10*3/uL — AB (ref 150–440)
PROMYELOCYTES ABS: 0 %
RBC: 3.41 MIL/uL — AB (ref 4.40–5.90)
RDW: 27.6 % — ABNORMAL HIGH (ref 11.5–14.5)
SMEAR REVIEW: ADEQUATE
WBC: 8.9 10*3/uL (ref 3.8–10.6)

## 2017-06-15 MED ORDER — BORTEZOMIB CHEMO SQ INJECTION 3.5 MG (2.5MG/ML)
1.3000 mg/m2 | Freq: Once | INTRAMUSCULAR | Status: AC
Start: 2017-06-15 — End: 2017-06-15
  Administered 2017-06-15: 2.75 mg via SUBCUTANEOUS
  Filled 2017-06-15: qty 2.75

## 2017-06-15 MED ORDER — PROCHLORPERAZINE MALEATE 10 MG PO TABS
10.0000 mg | ORAL_TABLET | Freq: Once | ORAL | Status: AC
  Administered 2017-06-15: 10 mg via ORAL
  Filled 2017-06-15: qty 1

## 2017-06-15 NOTE — Telephone Encounter (Signed)
Patient Scr > 1.5. Per MD patient to be treated.

## 2017-06-15 NOTE — Progress Notes (Signed)
Hematology/Oncology Follow Up Note [facility] [phone] [phone]  Patient Care Team: Jodi Marble, MD as PCP - General (Internal Medicine) Bary Castilla, Forest Gleason, MD (General Surgery) Edrick Kins, MD as Rounding Team (Internal Medicine) Hillary Bow, MD as Consulting Physician (Internal Medicine) [name], MD as Surgeon (Gastroenterology) Isaias Cowman, MD as Consulting Physician (Cardiology)  REFERRING PROVIDER: Dr.Lateef, [place] CHIEF COMPLAINTS/REASON FOR VISIT  Abnormal UPEP results, follow up for evaluation of monoclonal gammopathy  HISTORY OF PRESENTING ILLNESS:  77 y.o.  male with PMH listed below who presents to follow up on the evaluation and management of his abnormal urine protein electrophoresis results. I reviewed the records from [facility], [place] and [place] was performed and HemOnc related medical problems are listed below.  He lives with a friend. He has 3 adult children.   1 Chronic Kidney disease, Stage II: patient follows up with Dr.Lateef.  2 Proteinuria:  02/18/2017: Albumin/Creatinine ratio was 7, Urine protein electrophoresis,Random Urine revealed M Spike 43.2%, total protein of 43.'7mg'$ /dl,  01/05/2017 Albumin/Creatinine ratio was 455.2, Albumin 996.5, , 2.15.2018 Albumin/Creatinine ratio was 80.3, Albumin 73.3, Urine protein electrophoresis,Random Urine revealed M Spike 29.7%, total protein of 29.'1mg'$ /dl, 06/30/2016 Serum protein electrophoresis did not detect M spike.  Autoimmune disorder work up showed Negative anti dsDNA, RNP antibodies, smith antibody, Sjogren antibodies, anti Jo-1, positive antichromotin antibodies, positive ANA,  3 Anemia of chronic kidney disease:  4 Iron deficiency anemia: history of iron deficiency, ferritin was 7 on 08/11/2016. He had EGD and colonoscopy that were done this year which showed  duodenitis/esphagitis/diverticulosis/benigh polyps. 5 Rheumatoid arthritis: he follows up with Dr.Kernodle and is on chronic MTX. He Is currently off MTX reports joint pains are controlled, not quite symptomatic.   Patient denies any persistent bone pain or any pain. He continue to feel lack of energy, persistent, not improved with resting or taking naps. He also has lost 25 pounds in the past year which was unintentional.   # Rheumatoid arthritis and currently is off MTX.  Current Treatment: RVD  INTERVAL HISTORY Patient presents for follow up for management of multiple myeloma. Today is day 15 of cycle 2, patient is here for Velcade subcutaneous injection.Marland Kitchen  He reports feeling fatigued, weakness. Shortness of breath with exertion. Patient denies any diarrhea after he was seen last. Denies any numbness or tingling.    Review of Systems  Constitutional: Negative for appetite change and fatigue.  HENT:   Negative for hearing loss, lump/mass and sore throat.   Eyes: Negative for eye problems.  Respiratory: Negative for chest tightness, cough and hemoptysis.   Cardiovascular: Negative for chest pain.  Gastrointestinal: Negative for abdominal distention and diarrhea.  Endocrine: Negative for hot flashes.  Genitourinary: Negative for difficulty urinating, dyspareunia and frequency.   Musculoskeletal: Negative for arthralgias, back pain and gait problem.  Skin: Negative for itching and rash.  Neurological: Negative for dizziness, gait problem and seizures.  Hematological: Negative for adenopathy. Does not bruise/bleed easily.  Psychiatric/Behavioral: Negative for confusion and depression. The patient is not nervous/anxious.      MEDICAL HISTORY:  Past Medical History:  Diagnosis Date  . Anxiety   . Arthritis    rheumatoid  . Atrial fibrillation (HCC)    hx of  . Cancer ([place])   . Chronic kidney disease   . Colon polyp 07-07-15   TUBULAR ADENOMA WITH AT LEAST HIGH-GRADE / Dr Rayann Heman  .  Diabetes mellitus without complication ([place])   .  Dysrhythmia    bradycardia...Marland KitchenMarland Kitchen2 to 1 heart block  . GERD (gastroesophageal reflux disease)   . Heart murmur    patient unaware of history of murmur  . Hypercholesterolemia   . Hypertension   . Presence of permanent cardiac pacemaker   . Prostate cancer ([place]) 01/01/13, 01/30/14   Gleason 3+4=7, volume 46.6 cc  . Rheumatoid arthritis ([place])   . S/P radiation therapy  04/03/2014 through 06/04/2014                                                      Prostate 7800 cGy in 40 sessions                          . Sleep apnea    uses cpap but it is not in the hospital with him    SURGICAL HISTORY: Past Surgical History:  Procedure Laterality Date  . ABDOMINAL AORTIC ANEURYSM REPAIR  11/2013  . COLON SURGERY  March 2017   Right hemicolectomy for tubulovillous adenoma with high-grade dysplasia.  . COLONOSCOPY WITH PROPOFOL N/A 07/07/2015   Procedure: COLONOSCOPY WITH PROPOFOL;  Surgeon: Josefine Class, MD;  Location: [facility] ENDOSCOPY;  Service: Endoscopy;  Laterality: N/A;  . COLONOSCOPY WITH PROPOFOL N/A 10/28/2016   Procedure: COLONOSCOPY WITH PROPOFOL;  Surgeon: [name], MD;  Location: [facility] ENDOSCOPY;  Service: Endoscopy;  Laterality: N/A;  . ESOPHAGOGASTRODUODENOSCOPY (EGD) WITH PROPOFOL N/A 10/28/2016   Procedure: ESOPHAGOGASTRODUODENOSCOPY (EGD) WITH PROPOFOL;  Surgeon: [name], MD;  Location: [facility] ENDOSCOPY;  Service: Endoscopy;  Laterality: N/A;  . LAPAROSCOPIC RIGHT COLECTOMY Right 08/08/2015   Procedure: LAPAROSCOPIC RIGHT COLECTOMY;  Surgeon: Robert Bellow, MD;  Location: [facility] ORS;  Service: General;  Laterality: Right;  . NASAL SINUS SURGERY    . PACEMAKER INSERTION Left 08/26/2016   Procedure: INSERTION PACEMAKER;  Surgeon: Isaias Cowman, MD;  Location: [facility] ORS;  Service: Cardiovascular;  Laterality: Left;  . PROSTATE BIOPSY  01/01/13, 01/30/14   Gleason 3+3=6, vol 46.6 cc  . TONSILLECTOMY    . uvula surgery     for  sleep apnea    SOCIAL HISTORY: Social History   Socioeconomic History  . Marital status: Divorced    Spouse name: Not on file  . Number of children: Not on file  . Years of education: Not on file  . Highest education level: Not on file  Social Needs  . Financial resource strain: Not on file  . Food insecurity - worry: Not on file  . Food insecurity - inability: Not on file  . Transportation needs - medical: Not on file  . Transportation needs - non-medical: Not on file  Occupational History  . Not on file  Tobacco Use  . Smoking status: Current Every Day Smoker    Packs/day: 0.50    Years: 50.00    Pack years: 25.00    Types: Cigarettes  . Smokeless tobacco: Never Used  . Tobacco comment: DOWN TO 1/2 PPD  Substance and Sexual Activity  . Alcohol use: Yes    Alcohol/week: 0.6 oz    Types: 1 Cans of beer per week    Comment: occasionally  . Drug use: No  . Sexual activity: Not on file  Other Topics Concern  . Not on file  Social History Narrative  . Not  on file    FAMILY HISTORY: Family History  Problem Relation Age of Onset  . Heart attack Mother   . Cirrhosis Father   . Pancreatic cancer Brother   . Diabetes Brother   . Diabetes Daughter        medication induced for cancer treatments  . Cancer Daughter        breast, brain    ALLERGIES:  has No Known Allergies.  MEDICATIONS:  Current Outpatient Medications  Medication Sig Dispense Refill  . acetaminophen (TYLENOL) 325 MG tablet Take 325 mg by mouth every 6 (six) hours as needed.    Marland Kitchen acyclovir (ZOVIRAX) 400 MG tablet Take 1 tablet (400 mg total) 2 (two) times daily by mouth. 60 tablet 3  . amLODipine-benazepril (LOTREL) 5-20 MG capsule Take 1 capsule by mouth daily.    Marland Kitchen aspirin 81 MG chewable tablet Chew 81 mg by mouth daily.    Marland Kitchen atorvastatin (LIPITOR) 80 MG tablet Take 80 mg by mouth at bedtime.     . Baclofen 5 MG TABS Take 5 mg by mouth 3 (three) times daily as needed (hiccups). 21 tablet 0  .  clopidogrel (PLAVIX) 75 MG tablet Take 75 mg by mouth daily.    . fexofenadine (ALLEGRA) 180 MG tablet Take 180 mg by mouth daily.    . fluticasone (FLONASE) 50 MCG/ACT nasal spray Place 2 sprays into both nostrils daily as needed for rhinitis.     . Fluticasone Furoate-Vilanterol (BREO ELLIPTA) 100-25 MCG/INH AEPB Inhale 1 puff into the lungs daily.     . folic acid (FOLVITE) 1 MG tablet Take 1 mg by mouth daily.    . isosorbide mononitrate (IMDUR) 30 MG 24 hr tablet Take 30 mg by mouth daily.    Marland Kitchen lenalidomide (REVLIMID) 10 MG capsule Take 1 capsule (10 mg total) by mouth daily. Take one capsule daily on days 1-14 every 21 days. 14 capsule 0  . loperamide (IMODIUM) 2 MG capsule Take 1 capsule (2 mg total) by mouth See admin instructions. With onset of diarrhea, take '4mg'$  followed by '2mg'$  every 2 hours resolved. Maximum: 16 mg/day 120 capsule 0  . metFORMIN (GLUCOPHAGE) 500 MG tablet Take 500 mg by mouth 2 (two) times daily with a meal.    . metoprolol succinate (TOPROL-XL) 50 MG 24 hr tablet Take 50 mg by mouth daily. Take with or immediately following a meal.    . pantoprazole (PROTONIX) 40 MG tablet Take 1 tablet (40 mg total) by mouth 2 (two) times daily. 60 tablet 3  . triamcinolone cream (KENALOG) 0.1 % Apply 1 application topically 2 (two) times daily.    Marland Kitchen dexamethasone (DECADRON) 4 MG tablet Take 5 tablets (20 mg) on days 1, 8, and 15 of chemo. Repeat every 21 days. Carepack. (Patient not taking: Reported on 06/01/2017) 15 tablet 3  . ondansetron (ZOFRAN) 8 MG tablet Take 1 tablet (8 mg total) 2 (two) times daily as needed by mouth (Nausea or vomiting). (Patient not taking: Reported on 06/01/2017) 30 tablet 1   No current facility-administered medications for this visit.       Marland Kitchen  PHYSICAL EXAMINATION: ECOG PERFORMANCE STATUS: 1 - Symptomatic but completely ambulatory Vitals:   06/15/17 1513  BP: 104/68  Pulse: 79  Resp: (!) 22  Temp: (!) 97.1 F (36.2 C)   Filed Weights    06/15/17 1513  Weight: 226 lb (102.5 kg)   Physical Exam  Constitutional: He is oriented to person, place, and time and  well-developed, well-nourished, and in no distress. No distress.  HENT:  Head: Normocephalic.  Mouth/Throat: No oropharyngeal exudate.  Eyes: Conjunctivae and EOM are normal. Pupils are equal, round, and reactive to light. Right eye exhibits no discharge. No scleral icterus.  Neck: Normal range of motion. Neck supple. No JVD present. No thyromegaly present.  Cardiovascular: Normal rate, regular rhythm and normal heart sounds. Exam reveals no friction rub.  No murmur heard. Pulmonary/Chest: Effort normal and breath sounds normal. No respiratory distress. He has no rales. He exhibits no tenderness.  Abdominal: Soft. Bowel sounds are normal. He exhibits no distension. There is no tenderness. There is no rebound.  Musculoskeletal: Normal range of motion. He exhibits no edema, tenderness or deformity.  Bilateral ankle joint tenderness.   Lymphadenopathy:    He has no cervical adenopathy.  Neurological: He is alert and oriented to person, place, and time. No cranial nerve deficit. Coordination normal.  Skin: Skin is dry. No rash noted. He is not diaphoretic. No erythema.  Psychiatric: Affect and judgment normal.    LABORATORY DATA:  I have reviewed the data as listed Lab Results  Component Value Date   WBC 8.9 06/15/2017   HGB 7.9 (L) 06/15/2017   HCT 25.9 (L) 06/15/2017   MCV 75.7 (L) 06/15/2017   PLT 144 (L) 06/15/2017   Recent Labs    06/01/17 1424 06/08/17 1410 06/15/17 1420  NA 140 140 141  K 4.3 4.0 4.3  CL 112* 110 110  CO2 21* 21* 24  GLUCOSE 90 97 75  BUN 24* 19 24*  CREATININE 1.26* 1.36* 1.57*  CALCIUM 8.2* 8.6* 8.3*  GFRNONAA 54* 49* 41*  GFRAA >60 57* 48*  PROT 6.7 6.4* 6.2*  ALBUMIN 3.2* 3.3* 3.0*  AST 13* 15 15  ALT '22 18 21  '$ ALKPHOS 88 64 62  BILITOT 0.3 0.5 0.5    SPEP: no M spike Serum free light chain Kappa/Lamda ratio  13.04 UPEP  Free light chain Kappa/lamda ratio 162.43, M spike '198mg'$ /24 hour   Bone marrow biopsy 03/21/2017  Bone Marrow, Aspirate,Biopsy, and Clot, left iliac - HYPERCELLULAR BONE MARROW FOR AGE WITH TRILINEAGE HEMATOPOIESIS. - PLASMACYTOSIS (PLASMA CELLS 8%)  Karyotype: loss of Y chromosome Cytogenetic MDS FISH Panel negative.   Bone marrow 04/06/2017  Bone Marrow, Aspirate,Biopsy, and Clot, right iliac and core - HYPERCELLULAR BONE MARROW FOR AGE WITH PLASMA CELL NEOPLASM - TRILINEAGE HEMATOPOIESIS. - SEE COMMENT. PERIPHERAL BLOOD: - MICROCYTIC-HYPOCHROMIC ANEMIA. Diagnosis Note The bone marrow is hypercellular with trilineage hematopoiesis but with relative abundance of erythroid precursors and increased number of megakaryocytes with nonspecific changes. Significant dyspoiesis is not seen. Iron stores are present with no ring sideroblasts. The plasma cells are increased in number representing 8% of all cells in the aspirate although focal areas in the core biopsy show 10 to 20% as primarily seen by CD138 stain. In situ hybridization for kappa and lambda light chains show kappa light chain restriction consistent with plasma cell neoplasm. Correlation with cytogenetic and FISH studies is recommended. (BNS:ecj/gt 04/07/2017)  IMAGE STUDIES I have personally reviewed below image results.  03/15/2017 DG bone survey Met: Nonspecific lucent lesion in the distal right ulna measuring 15-16 mm, but otherwise normal bone mineralization for age throughout the visible skeleton. A solitary lytic lesion in a distal extremity would be an unusual presentation of multiple myeloma, and I favor the distal right ulna lesion is benign. Recommend correlation with serum and urine protein electrophoresis.  04/14/2017 PET scan: 1. 2.1 cm hypermetabolic  soft tissue lesion in the left heel, adjacent to the calcaneal tuberosity. 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 2. Mottled FDG accumulation diffusely  in the marrow space without other frankly overt hypermetabolic bony lesion. 3. Coronary artery and thoracoabdominal aortic atherosclerosis with abdominal aortic stent graft visualized in situ. 4. Bilateral renal cysts and bilateral nonobstructing renal stones.    ASSESSMENT & PLAN:  1. Symptomatic anemia   2. Multiple myeloma, remission status unspecified ([place])   3. Iron deficiency anemia, unspecified iron deficiency anemia type   4. CKD (chronic kidney disease) stage 3, GFR 30-59 ml/min (HCC)   5. SOB (shortness of breath) on exertion   6. Encounter for antineoplastic chemotherapy    # light chain Multiple Myeloma, Day 15 of cycle 2 RVD Revlimid 10 mg PO once per day on days 1 to 14 (dose reduced due to GFR) Revlimid renal dosing of '10mg'$ ,  Velcade 1.3 mg/m2 IV once per day on days 1,  8, 15 Dexamethasone decreased to 20 mg PO once per day on days 1, 8, 15 given patient's diabetes  # Proceed Day 15 Velcade.  # Light chain ratio was ordered however not obtained. He will get light chain ratio tested today. # shortness of breath and fatigue, likely secondary to symptomatic anemia.Anemia secondary to treatment side effects. I have discussed with patient's cardiologist Dr. Humphrey Rolls. Per Dr.Dr.Khan, patient had a stress test done November 2018 which is being essentially negative.CT coronary angiogram can be considered however I would prefer patient not to get contrast study unless absolutely necessary. Dr. Humphrey Rolls agree with the plan and he also feels the shortness of breath most likely is secondary to anemia. We'll type and screen today, plan for one unit of PRBC transfusion tomorrow. # iron deficiency anemia,is status post IV venofer. . Will give transfusion tomorrow and arrange additional Venofer.  # chronic renal insufficiency stage 3,  Kidney function flucturates. Advise patient to continue oral hydration.    # continue Aspirin '81mg'$  and Plavix, close monitor platelet coutns.  # Continue  acyclovir for prophylaxis.  # Xgeva or zometa once he obtained dental clearance. # refer to Jettie Booze at [facility] for evaluation for eligibility of bone marrow transplant.   All questions were answered. The patient knows to call the clinic with any problems questions or concerns.  Return of visit: 1 week for weekly Velcade and reevaluation.  Earlie Server, MD, PhD Hematology Oncology [facility] Pager- [phone] 06/15/2017

## 2017-06-16 ENCOUNTER — Ambulatory Visit: Payer: Medicare Other | Admitting: Podiatry

## 2017-06-16 ENCOUNTER — Inpatient Hospital Stay: Payer: Medicare Other

## 2017-06-16 DIAGNOSIS — Z5112 Encounter for antineoplastic immunotherapy: Secondary | ICD-10-CM | POA: Diagnosis not present

## 2017-06-16 DIAGNOSIS — D649 Anemia, unspecified: Secondary | ICD-10-CM

## 2017-06-16 LAB — PREPARE RBC (CROSSMATCH)

## 2017-06-16 LAB — KAPPA/LAMBDA LIGHT CHAINS
KAPPA FREE LGHT CHN: 125.2 mg/L — AB (ref 3.3–19.4)
Kappa, lambda light chain ratio: 3.3 — ABNORMAL HIGH (ref 0.26–1.65)
Lambda free light chains: 37.9 mg/L — ABNORMAL HIGH (ref 5.7–26.3)

## 2017-06-16 MED ORDER — SODIUM CHLORIDE 0.9 % IV SOLN
250.0000 mL | Freq: Once | INTRAVENOUS | Status: AC
  Administered 2017-06-16: 250 mL via INTRAVENOUS
  Filled 2017-06-16: qty 250

## 2017-06-16 MED ORDER — LENALIDOMIDE 10 MG PO CAPS: 10.0000 mg | ORAL_CAPSULE | Freq: Every day | ORAL | 0 refills | Status: DC

## 2017-06-16 MED ORDER — DIPHENHYDRAMINE HCL 25 MG PO CAPS
25.0000 mg | ORAL_CAPSULE | Freq: Once | ORAL | Status: AC
  Administered 2017-06-16: 25 mg via ORAL
  Filled 2017-06-16: qty 1

## 2017-06-16 MED ORDER — ACETAMINOPHEN 325 MG PO TABS
650.0000 mg | ORAL_TABLET | Freq: Once | ORAL | Status: AC
  Administered 2017-06-16: 650 mg via ORAL
  Filled 2017-06-16: qty 2

## 2017-06-17 ENCOUNTER — Inpatient Hospital Stay: Payer: Medicare Other

## 2017-06-17 ENCOUNTER — Telehealth: Payer: Self-pay | Admitting: Oncology

## 2017-06-17 VITALS — BP 109/72 | HR 60 | Temp 98.0°F | Resp 20

## 2017-06-17 DIAGNOSIS — D509 Iron deficiency anemia, unspecified: Secondary | ICD-10-CM

## 2017-06-17 DIAGNOSIS — Z5112 Encounter for antineoplastic immunotherapy: Secondary | ICD-10-CM | POA: Diagnosis not present

## 2017-06-17 LAB — BPAM RBC
Blood Product Expiration Date: 201901162359
Blood Product Expiration Date: 201901162359
ISSUE DATE / TIME: 201901101358
ISSUE DATE / TIME: 201901101136
Unit Type and Rh: 5100
Unit Type and Rh: 9500

## 2017-06-17 LAB — TYPE AND SCREEN
ABO/RH(D): O POS
Antibody Screen: NEGATIVE
Unit division: 0
Unit division: 0

## 2017-06-17 MED ORDER — SODIUM CHLORIDE 0.9 % IV SOLN
INTRAVENOUS | Status: DC
  Administered 2017-06-17: 15:00:00 via INTRAVENOUS
  Filled 2017-06-17: qty 1000

## 2017-06-17 MED ORDER — SODIUM CHLORIDE 0.9 % IV SOLN
Freq: Once | INTRAVENOUS | Status: DC
  Filled 2017-06-17: qty 1000

## 2017-06-17 MED ORDER — IRON SUCROSE 20 MG/ML IV SOLN
200.0000 mg | Freq: Once | INTRAVENOUS | Status: AC
  Administered 2017-06-17: 200 mg via INTRAVENOUS
  Filled 2017-06-17: qty 10

## 2017-06-17 NOTE — Telephone Encounter (Signed)
Oral Oncology Patient Advocate Encounter  Received a fax from [facility] that Revilmid and Dexamethasone was shipped to patients home.  [place] Patient Advocate [phone] 06/17/2017 9:33 AM

## 2017-06-20 DIAGNOSIS — N529 Male erectile dysfunction, unspecified: Secondary | ICD-10-CM | POA: Insufficient documentation

## 2017-06-20 DIAGNOSIS — G44019 Episodic cluster headache, not intractable: Secondary | ICD-10-CM | POA: Insufficient documentation

## 2017-06-20 DIAGNOSIS — G629 Polyneuropathy, unspecified: Secondary | ICD-10-CM | POA: Insufficient documentation

## 2017-06-20 DIAGNOSIS — M5412 Radiculopathy, cervical region: Secondary | ICD-10-CM | POA: Insufficient documentation

## 2017-06-21 NOTE — Progress Notes (Signed)
You in  Hematology/Oncology Follow Up Note [facility] Telephone:([phone] Fax:(336) [phone]  Patient Care Team: Jodi Marble, MD as PCP - General (Internal Medicine) Bary Castilla, Forest Gleason, MD (General Surgery) Edrick Kins, MD as Rounding Team (Internal Medicine) Hillary Bow, MD as Consulting Physician (Internal Medicine) [name], MD as Surgeon (Gastroenterology) Isaias Cowman, MD as Consulting Physician (Cardiology)  REFERRING PROVIDER: Dr.Lateef, [place] CHIEF COMPLAINTS/REASON FOR VISIT  Abnormal UPEP results, follow up for evaluation of monoclonal gammopathy  HISTORY OF PRESENTING ILLNESS:  77 y.o.  male with PMH listed below who presents to follow up on the evaluation and management of his abnormal urine protein electrophoresis results. I reviewed the records from [facility], [place] and [place] was performed and HemOnc related medical problems are listed below.  He lives with a friend. He has 3 adult children.   1 Chronic Kidney disease, Stage II: patient follows up with Dr.Lateef.  2 Proteinuria:  02/18/2017: Albumin/Creatinine ratio was 7, Urine protein electrophoresis,Random Urine revealed M Spike 43.2%, total protein of 43.53m/dl,  01/05/2017 Albumin/Creatinine ratio was 455.2, Albumin 996.5, , 2.15.2018 Albumin/Creatinine ratio was 80.3, Albumin 73.3, Urine protein electrophoresis,Random Urine revealed M Spike 29.7%, total protein of 29.167mdl, 06/30/2016 Serum protein electrophoresis did not detect M spike.  Autoimmune disorder work up showed Negative anti dsDNA, RNP antibodies, smith antibody, Sjogren antibodies, anti Jo-1, positive antichromotin antibodies, positive ANA,  3 Anemia of chronic kidney disease:  4 Iron deficiency anemia: history of iron deficiency, ferritin was 7 on 08/11/2016. He had EGD and colonoscopy that were done this year which showed  duodenitis/esphagitis/diverticulosis/benigh polyps. 5 Rheumatoid arthritis: he follows up with Dr.Kernodle and is on chronic MTX. He Is currently off MTX reports joint pains are controlled, not quite symptomatic.   Patient denies any persistent bone pain or any pain. He continue to feel lack of energy, persistent, not improved with resting or taking naps. He also has lost 25 pounds in the past year which was unintentional.   # Rheumatoid arthritis and currently is off MTX.  Current Treatment: RVD  INTERVAL HISTORY Patient presents for follow up for management of multiple myeloma. Today is day 1 of cycle 1 RVD, patient is here for Velcade subcutaneous injection.. He has got 1 unit of PRBC transfusion for symptomatic anemia. During the interval patient's cardiologist started patient on Lasix 20 mg daily. His primary care physician started patient on a short course of prophylactic antibiotics. Patient subjectively reports feeling much better today. Less fatigued. Shortness of breath has also improved. Denies any diarrhea, abdominal pain [facility]. He has lower extremity swelling bilaterally. No calf tenderness. He reports his chronic neuropathy is at baseline.    Review of Systems  Constitutional: Negative for appetite change, chills, fatigue and fever.  HENT:   Negative for hearing loss, lump/mass and sore throat.   Eyes: Negative for eye problems.  Respiratory: Negative for chest tightness, cough and hemoptysis.   Cardiovascular: Positive for leg swelling. Negative for chest pain.  Gastrointestinal: Negative for abdominal distention and diarrhea.  Endocrine: Negative for hot flashes.  Genitourinary: Negative for bladder incontinence, difficulty urinating, dyspareunia and frequency.   Musculoskeletal: Negative for arthralgias, back pain and gait problem.  Skin: Negative for itching and rash.  Neurological: Negative for dizziness, gait problem and seizures.    Hematological: Negative for adenopathy. Does not bruise/bleed easily.  Psychiatric/Behavioral: Negative for confusion and depression. The patient is not nervous/anxious.  MEDICAL HISTORY:  Past Medical History:  Diagnosis Date  . Anxiety   . Arthritis    rheumatoid  . Atrial fibrillation (HCC)    hx of  . Cancer ([place])   . Chronic kidney disease   . Colon polyp 07-07-15   TUBULAR ADENOMA WITH AT LEAST HIGH-GRADE / Dr Rayann Heman  . Diabetes mellitus without complication ([place])   . Dysrhythmia    bradycardia...Marland KitchenMarland Kitchen2 to 1 heart block  . GERD (gastroesophageal reflux disease)   . Heart murmur    patient unaware of history of murmur  . Hypercholesterolemia   . Hypertension   . Presence of permanent cardiac pacemaker   . Prostate cancer ([place]) 01/01/13, 01/30/14   Gleason 3+4=7, volume 46.6 cc  . Rheumatoid arthritis ([place])   . S/P radiation therapy  04/03/2014 through 06/04/2014                                                      Prostate 7800 cGy in 40 sessions                          . Sleep apnea    uses cpap but it is not in the hospital with him    SURGICAL HISTORY: Past Surgical History:  Procedure Laterality Date  . ABDOMINAL AORTIC ANEURYSM REPAIR  11/2013  . COLON SURGERY  March 2017   Right hemicolectomy for tubulovillous adenoma with high-grade dysplasia.  . COLONOSCOPY WITH PROPOFOL N/A 07/07/2015   Procedure: COLONOSCOPY WITH PROPOFOL;  Surgeon: Josefine Class, MD;  Location: [facility] ENDOSCOPY;  Service: Endoscopy;  Laterality: N/A;  . COLONOSCOPY WITH PROPOFOL N/A 10/28/2016   Procedure: COLONOSCOPY WITH PROPOFOL;  Surgeon: [name], MD;  Location: [facility] ENDOSCOPY;  Service: Endoscopy;  Laterality: N/A;  . ESOPHAGOGASTRODUODENOSCOPY (EGD) WITH PROPOFOL N/A 10/28/2016   Procedure: ESOPHAGOGASTRODUODENOSCOPY (EGD) WITH PROPOFOL;  Surgeon: [name], MD;  Location[facility] ENDOSCOPY;  Service: Endoscopy;  Laterality: N/A;  . LAPAROSCOPIC RIGHT COLECTOMY Right 08/08/2015    Procedure: LAPAROSCOPIC RIGHT COLECTOMY;  Surgeon: Robert Bellow, MD;  Location: [facility] ORS;  Service: General;  Laterality: Right;  . NASAL SINUS SURGERY    . PACEMAKER INSERTION Left 08/26/2016   Procedure: INSERTION PACEMAKER;  Surgeon: Isaias Cowman, MD;  Location: [facility] ORS;  Service: Cardiovascular;  Laterality: Left;  . PROSTATE BIOPSY  01/01/13, 01/30/14   Gleason 3+3=6, vol 46.6 cc  . TONSILLECTOMY    . uvula surgery     for sleep apnea    SOCIAL HISTORY: Social History   Socioeconomic History  . Marital status: Divorced    Spouse name: Not on file  . Number of children: Not on file  . Years of education: Not on file  . Highest education level: Not on file  Social Needs  . Financial resource strain: Not on file  . Food insecurity - worry: Not on file  . Food insecurity - inability: Not on file  . Transportation needs - medical: Not on file  . Transportation needs - non-medical: Not on file  Occupational History  . Not on file  Tobacco Use  . Smoking status: Current Every Day Smoker    Packs/day: 0.50    Years: 50.00    Pack years: 25.00    Types: Cigarettes  . Smokeless tobacco: Never Used  .  Tobacco comment: DOWN TO 1/2 PPD  Substance and Sexual Activity  . Alcohol use: Yes    Alcohol/week: 0.6 oz    Types: 1 Cans of beer per week    Comment: occasionally  . Drug use: No  . Sexual activity: Not on file  Other Topics Concern  . Not on file  Social History Narrative  . Not on file    FAMILY HISTORY: Family History  Problem Relation Age of Onset  . Heart attack Mother   . Cirrhosis Father   . Pancreatic cancer Brother   . Diabetes Brother   . Diabetes Daughter        medication induced for cancer treatments  . Cancer Daughter        breast, brain    ALLERGIES:  has No Known Allergies.  MEDICATIONS:  Current Outpatient Medications  Medication Sig Dispense Refill  . acetaminophen (TYLENOL) 325 MG tablet Take 325 mg by mouth every 6 (six)  hours as needed.    Marland Kitchen acyclovir (ZOVIRAX) 400 MG tablet Take 1 tablet (400 mg total) 2 (two) times daily by mouth. 60 tablet 3  . amLODipine-benazepril (LOTREL) 5-20 MG capsule Take 1 capsule by mouth daily.    Marland Kitchen aspirin 81 MG chewable tablet Chew 81 mg by mouth daily.    Marland Kitchen atorvastatin (LIPITOR) 80 MG tablet Take 80 mg by mouth at bedtime.     . Baclofen 5 MG TABS Take 5 mg by mouth 3 (three) times daily as needed (hiccups). 21 tablet 0  . clopidogrel (PLAVIX) 75 MG tablet Take 75 mg by mouth daily.    Marland Kitchen dexamethasone (DECADRON) 4 MG tablet Take 5 tablets (20 mg) on days 1, 8, and 15 of chemo. Repeat every 21 days. Carepack. (Patient not taking: Reported on 06/01/2017) 15 tablet 3  . fexofenadine (ALLEGRA) 180 MG tablet Take 180 mg by mouth daily.    . fluticasone (FLONASE) 50 MCG/ACT nasal spray Place 2 sprays into both nostrils daily as needed for rhinitis.     . Fluticasone Furoate-Vilanterol (BREO ELLIPTA) 100-25 MCG/INH AEPB Inhale 1 puff into the lungs daily.     . folic acid (FOLVITE) 1 MG tablet Take 1 mg by mouth daily.    . isosorbide mononitrate (IMDUR) 30 MG 24 hr tablet Take 30 mg by mouth daily.    Marland Kitchen lenalidomide (REVLIMID) 10 MG capsule Take 1 capsule (10 mg total) by mouth daily. Take one capsule daily on days 1-14 every 21 days. 14 capsule 0  . loperamide (IMODIUM) 2 MG capsule Take 1 capsule (2 mg total) by mouth See admin instructions. With onset of diarrhea, take 80m followed by 271mevery 2 hours resolved. Maximum: 16 mg/day 120 capsule 0  . metFORMIN (GLUCOPHAGE) 500 MG tablet Take 500 mg by mouth 2 (two) times daily with a meal.    . metoprolol succinate (TOPROL-XL) 50 MG 24 hr tablet Take 50 mg by mouth daily. Take with or immediately following a meal.    . ondansetron (ZOFRAN) 8 MG tablet Take 1 tablet (8 mg total) 2 (two) times daily as needed by mouth (Nausea or vomiting). (Patient not taking: Reported on 06/01/2017) 30 tablet 1  . pantoprazole (PROTONIX) 40 MG tablet  Take 1 tablet (40 mg total) by mouth 2 (two) times daily. 60 tablet 3  . triamcinolone cream (KENALOG) 0.1 % Apply 1 application topically 2 (two) times daily.     No current facility-administered medications for this visit.       .Marland Kitchen  PHYSICAL EXAMINATION: ECOG PERFORMANCE STATUS: 1 - Symptomatic but completely ambulatory Vitals:   06/22/17 1437  BP: 113/76  Pulse: 96  Resp: 18  Temp: (!) 97 F (36.1 C)   Filed Weights   06/22/17 1434  Weight: 227 lb (103 kg)   Physical Exam  Constitutional: He is oriented to person, place, and time and well-developed, well-nourished, and in no distress. No distress.  HENT:  Head: Normocephalic.  Mouth/Throat: Oropharynx is clear and moist. No oropharyngeal exudate.  Eyes: Conjunctivae and EOM are normal. Pupils are equal, round, and reactive to light. Right eye exhibits no discharge. No scleral icterus.  Neck: Normal range of motion. Neck supple. No thyromegaly present.  Cardiovascular: Normal rate, regular rhythm and normal heart sounds. Exam reveals no friction rub.  No murmur heard. Pulmonary/Chest: Effort normal and breath sounds normal. No respiratory distress. He has no rales.  Abdominal: Soft. Bowel sounds are normal. He exhibits no distension. There is no rebound.  Musculoskeletal: Normal range of motion. He exhibits edema.  Bilateral foot edema 1+  Lymphadenopathy:    He has no cervical adenopathy.  Neurological: He is alert and oriented to person, place, and time. No cranial nerve deficit. Coordination normal.  Skin: Skin is dry. No rash noted. He is not diaphoretic. No erythema.  Psychiatric: Affect and judgment normal.    LABORATORY DATA:  I have reviewed the data as listed Lab Results  Component Value Date   WBC 8.9 06/15/2017   HGB 7.9 (L) 06/15/2017   HCT 25.9 (L) 06/15/2017   MCV 75.7 (L) 06/15/2017   PLT 144 (L) 06/15/2017   Recent Labs    06/01/17 1424 06/08/17 1410 06/15/17 1420  NA 140 140 141  K 4.3 4.0  4.3  CL 112* 110 110  CO2 21* 21* 24  GLUCOSE 90 97 75  BUN 24* 19 24*  CREATININE 1.26* 1.36* 1.57*  CALCIUM 8.2* 8.6* 8.3*  GFRNONAA 54* 49* 41*  GFRAA >60 57* 48*  PROT 6.7 6.4* 6.2*  ALBUMIN 3.2* 3.3* 3.0*  AST 13* 15 15  ALT _0 ALKPHOS 88 64 62  BILITOT 0.3 0.5 0.5    SPEP: no M spike Serum free light chain Kappa/Lamda ratio 13.04 UPEP  Free light chain Kappa/lamda ratio 162.43, M spike 136m/24 hour   Bone marrow biopsy 03/21/2017  Bone Marrow, Aspirate,Biopsy, and Clot, left iliac - HYPERCELLULAR BONE MARROW FOR AGE WITH TRILINEAGE HEMATOPOIESIS. - PLASMACYTOSIS (PLASMA CELLS 8%)  Karyotype: loss of Y chromosome Cytogenetic MDS FISH Panel negative.   Bone marrow 04/06/2017  Bone Marrow, Aspirate,Biopsy, and Clot, right iliac and core - HYPERCELLULAR BONE MARROW FOR AGE WITH PLASMA CELL NEOPLASM - TRILINEAGE HEMATOPOIESIS. - SEE COMMENT. PERIPHERAL BLOOD: - MICROCYTIC-HYPOCHROMIC ANEMIA. Diagnosis Note The bone marrow is hypercellular with trilineage hematopoiesis but with relative abundance of erythroid precursors and increased number of megakaryocytes with nonspecific changes. Significant dyspoiesis is not seen. Iron stores are present with no ring sideroblasts. The plasma cells are increased in number representing 8% of all cells in the aspirate although focal areas in the core biopsy show 10 to 20% as primarily seen by CD138 stain. In situ hybridization for kappa and lambda light chains show kappa light chain restriction consistent with plasma cell neoplasm. Correlation with cytogenetic and FISH studies is recommended. (BNS:ecj/gt 04/07/2017)  IMAGE STUDIES I have personally reviewed below image results.  03/15/2017 DG bone survey Met: Nonspecific lucent lesion in the distal right ulna measuring 15-16 mm, but otherwise normal  bone mineralization for age throughout the visible skeleton. A solitary lytic lesion in a distal extremity would be an unusual  presentation of multiple myeloma, and I favor the distal right ulna lesion is benign. Recommend correlation with serum and urine protein electrophoresis.  04/14/2017 PET scan: 1. 2.1 cm hypermetabolic soft tissue lesion in the left heel, adjacent to the calcaneal tuberosity. 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 2. Mottled FDG accumulation diffusely in the marrow space without other frankly overt hypermetabolic bony lesion. 3. Coronary artery and thoracoabdominal aortic atherosclerosis with abdominal aortic stent graft visualized in situ. 4. Bilateral renal cysts and bilateral nonobstructing renal stones.    ASSESSMENT & PLAN:  1. Multiple myeloma, remission status unspecified ([place])   2. CKD (chronic kidney disease) stage 3, GFR 30-59 ml/min (HCC)   3. Encounter for antineoplastic chemotherapy   4. Iron deficiency anemia, unspecified iron deficiency anemia type    # light chain Multiple Myeloma, Day 1 of cycle 3 RVD Revlimid 10 mg PO once per day on days 1 to 14 (dose reduced due to GFR) Revlimid renal dosing of 31m,  Velcade 1.3 mg/m2 IV once per day on days 1,  8, 15 Dexamethasone decreased to 20 mg PO once per day on days 1, 8, 15 given patient's diabetes  # Proceed Cycle 3 day 1 Velcade.  # Light chain ratio decreased. Ratio decreased from 13.04 to 3.3.   # shortness of breath and fatigue, likely secondary to symptomatic anemia, and symptom has improved after PRBC transfusion.  # iron deficiency anemia,ongoing IV venofer, MCV improving. # chronic renal insufficiency stage 3,  Kidney function flucturates. Advise patient to continue oral hydration. [facility]  # continue Aspirin 865mand Plavix, close monitor platelet coutns.  # Continue acyclovir for prophylaxis.  # Xgeva or zometa once he obtained dental clearance. We discuss about dental clearance today again. # [facility]  transplant.   All questions were answered. The patient knows to call the clinic with any problems questions or concerns.  Return of visit: 1 week for weekly Velcade and reevaluation.  ZhEarlie ServerMD, PhD Hematology Oncology [facility]- [phone]/16/2019

## 2017-06-22 ENCOUNTER — Inpatient Hospital Stay: Payer: Medicare Other

## 2017-06-22 ENCOUNTER — Encounter: Payer: Self-pay | Admitting: Oncology

## 2017-06-22 ENCOUNTER — Inpatient Hospital Stay (HOSPITAL_BASED_OUTPATIENT_CLINIC_OR_DEPARTMENT_OTHER): Payer: Medicare Other | Admitting: Oncology

## 2017-06-22 VITALS — BP 113/76 | HR 96 | Temp 97.0°F | Resp 18 | Wt 227.0 lb

## 2017-06-22 DIAGNOSIS — D509 Iron deficiency anemia, unspecified: Secondary | ICD-10-CM

## 2017-06-22 DIAGNOSIS — C9000 Multiple myeloma not having achieved remission: Secondary | ICD-10-CM | POA: Diagnosis not present

## 2017-06-22 DIAGNOSIS — D631 Anemia in chronic kidney disease: Secondary | ICD-10-CM

## 2017-06-22 DIAGNOSIS — N183 Chronic kidney disease, stage 3 unspecified: Secondary | ICD-10-CM

## 2017-06-22 DIAGNOSIS — Z5112 Encounter for antineoplastic immunotherapy: Secondary | ICD-10-CM | POA: Diagnosis not present

## 2017-06-22 DIAGNOSIS — Z5111 Encounter for antineoplastic chemotherapy: Secondary | ICD-10-CM

## 2017-06-22 LAB — CBC WITH DIFFERENTIAL/PLATELET
Basophils Absolute: 0.1 10*3/uL (ref 0–0.1)
Basophils Relative: 1 %
EOS PCT: 3 %
Eosinophils Absolute: 0.2 10*3/uL (ref 0–0.7)
HCT: 30.4 % — ABNORMAL LOW (ref 40.0–52.0)
Hemoglobin: 9.2 g/dL — ABNORMAL LOW (ref 13.0–18.0)
LYMPHS ABS: 1.2 10*3/uL (ref 1.0–3.6)
Lymphocytes Relative: 16 %
MCH: 23.8 pg — ABNORMAL LOW (ref 26.0–34.0)
MCHC: 30.3 g/dL — ABNORMAL LOW (ref 32.0–36.0)
MCV: 78.5 fL — AB (ref 80.0–100.0)
MONO ABS: 0.9 10*3/uL (ref 0.2–1.0)
Monocytes Relative: 12 %
Neutro Abs: 5.1 10*3/uL (ref 1.4–6.5)
Neutrophils Relative %: 68 %
PLATELETS: 231 10*3/uL (ref 150–440)
RBC: 3.87 MIL/uL — AB (ref 4.40–5.90)
RDW: 29.5 % — AB (ref 11.5–14.5)
WBC: 7.5 10*3/uL (ref 3.8–10.6)

## 2017-06-22 LAB — COMPREHENSIVE METABOLIC PANEL
ALT: 17 U/L (ref 17–63)
AST: 15 U/L (ref 15–41)
Albumin: 3.4 g/dL — ABNORMAL LOW (ref 3.5–5.0)
Alkaline Phosphatase: 65 U/L (ref 38–126)
Anion gap: 9 (ref 5–15)
BUN: 20 mg/dL (ref 6–20)
CHLORIDE: 110 mmol/L (ref 101–111)
CO2: 21 mmol/L — ABNORMAL LOW (ref 22–32)
Calcium: 8.5 mg/dL — ABNORMAL LOW (ref 8.9–10.3)
Creatinine, Ser: 1.37 mg/dL — ABNORMAL HIGH (ref 0.61–1.24)
GFR calc Af Amer: 57 mL/min — ABNORMAL LOW (ref >60)
GFR, EST NON AFRICAN AMERICAN: 49 mL/min — AB (ref >60)
Glucose, Bld: 106 mg/dL — ABNORMAL HIGH (ref 65–99)
POTASSIUM: 4 mmol/L (ref 3.5–5.1)
Sodium: 140 mmol/L (ref 135–145)
Total Bilirubin: 0.5 mg/dL (ref 0.3–1.2)
Total Protein: 6.7 g/dL (ref 6.5–8.1)

## 2017-06-22 MED ORDER — SODIUM CHLORIDE 0.9 % IV SOLN
INTRAVENOUS | Status: DC
  Administered 2017-06-22: 15:00:00 via INTRAVENOUS
  Filled 2017-06-22: qty 1000

## 2017-06-22 MED ORDER — IRON SUCROSE 20 MG/ML IV SOLN
200.0000 mg | Freq: Once | INTRAVENOUS | Status: AC
  Administered 2017-06-22: 200 mg via INTRAVENOUS
  Filled 2017-06-22: qty 10

## 2017-06-22 MED ORDER — PROCHLORPERAZINE MALEATE 10 MG PO TABS
10.0000 mg | ORAL_TABLET | Freq: Once | ORAL | Status: AC
  Administered 2017-06-22: 10 mg via ORAL
  Filled 2017-06-22: qty 1

## 2017-06-22 MED ORDER — BORTEZOMIB CHEMO SQ INJECTION 3.5 MG (2.5MG/ML)
1.3000 mg/m2 | Freq: Once | INTRAMUSCULAR | Status: AC
  Administered 2017-06-22: 2.75 mg via SUBCUTANEOUS
  Filled 2017-06-22: qty 1.1

## 2017-06-24 ENCOUNTER — Ambulatory Visit: Payer: Self-pay

## 2017-06-28 ENCOUNTER — Telehealth: Payer: Self-pay | Admitting: Oncology

## 2017-06-28 ENCOUNTER — Encounter: Payer: Self-pay | Admitting: Gastroenterology

## 2017-06-28 ENCOUNTER — Ambulatory Visit: Payer: Medicare Other | Admitting: Gastroenterology

## 2017-06-28 VITALS — BP 115/71 | HR 82 | Temp 97.5°F | Ht 71.0 in | Wt 232.2 lb

## 2017-06-28 DIAGNOSIS — D509 Iron deficiency anemia, unspecified: Secondary | ICD-10-CM

## 2017-06-28 NOTE — Progress Notes (Signed)
[name] MD, MRCP(U.K) [address]  [place]  [place], [place] [address]  Main: [phone]  Fax: [phone]   Primary Care Physician: Jodi Marble, MD  Primary Gastroenterologist:  Dr. [name]   Chief Complaint  Patient presents with  . Iron Deficiency Anemia    HPI: Omar [name] is a 77 y.o. male    Summary of history : He has been referred by Dr. Tasia Catchings for evaluation of iron deficiency anemia.  He has a history of multiple myeloma and has actually been referred to [facility] for evaluation of autologous hematopoietic cell transplantation.  I have previously seen this patient in April 2018 in the hospital follow-up with my office.  He has had a right colectomy in 2017 the polyp with high-grade dysplasia in the ascending colon, has had rectal AVMs treated with APC in 2017.  He was hospitalized in 2018 with melena.  At that point of time he was on Plavix.  Plan at that time was for evaluation of his colon to rule out any AVMs which need to be treated.  I performed a colonoscopy in May 2018 nonbleeding internal hemorrhoids were seen.  Multiple medium-sized AVMs were found in the rectum nonbleeding and the appearance suggestive of radiation proctitis very close to the anal verge.  3 sessile polyps resected.  He underwent an upper endoscopy in May 2018 by myself and noted gastritis duodenitis LA grade a esophagitis, benign stricture/Schatzki's ring was seen which was not dilated as he did not have any dysphagia.  At that point of time I placed him on Prilosec 40 mg for 8 weeks. He does have CKD.   Interval history   5//2018-  06/28/2016   Denies any overt blood loss. No blood in his stool. He has had radiation to his prostate  CBC Latest Ref Rng & Units 06/22/2017 06/15/2017 06/08/2017  WBC 3.8 - 10.6 K/uL 7.5 8.9 7.8  Hemoglobin 13.0 - 18.0 g/dL 9.2(L) 7.9(L) 8.6(L)  Hematocrit 40.0 - 52.0 % 30.4(L) 25.9(L) 28.1(L)  Platelets 150 - 440 K/uL 231 144(L) 279    Iron/TIBC/Ferritin/ %Sat    Component Value Date/Time   IRON 14 (L) 06/08/2017 1411   TIBC 398 06/08/2017 1411   FERRITIN 49 06/08/2017 1411   IRONPCTSAT 4 (L) 06/08/2017 1411    Current Outpatient Medications  Medication Sig Dispense Refill  . acetaminophen (TYLENOL) 325 MG tablet Take 325 mg by mouth every 6 (six) hours as needed.    Marland Kitchen acyclovir (ZOVIRAX) 400 MG tablet Take 1 tablet (400 mg total) 2 (two) times daily by mouth. 60 tablet 3  . amLODipine-benazepril (LOTREL) 5-20 MG capsule Take 1 capsule by mouth daily.    Marland Kitchen aspirin 81 MG chewable tablet Chew 81 mg by mouth daily.    Marland Kitchen atorvastatin (LIPITOR) 80 MG tablet Take 80 mg by mouth at bedtime.     . Baclofen 5 MG TABS Take 5 mg by mouth 3 (three) times daily as needed (hiccups). 21 tablet 0  . clopidogrel (PLAVIX) 75 MG tablet Take 75 mg by mouth daily.    Marland Kitchen dexamethasone (DECADRON) 4 MG tablet Take 5 tablets (20 mg) on days 1, 8, and 15 of chemo. Repeat every 21 days. Carepack. 15 tablet 3  . DEXAMETHASONE PO Take 20 mg by mouth See admin instructions.  3  . fexofenadine (ALLEGRA) 180 MG tablet Take 180 mg by mouth daily.    . fluticasone (FLONASE) 50 MCG/ACT nasal spray Place 2 sprays into both  nostrils daily as needed for rhinitis.     . Fluticasone Furoate-Vilanterol (BREO ELLIPTA) 100-25 MCG/INH AEPB Inhale 1 puff into the lungs daily.     . folic acid (FOLVITE) 1 MG tablet Take 1 mg by mouth daily.    . furosemide (LASIX) 20 MG tablet Take 20 mg by mouth daily.    . isosorbide mononitrate (IMDUR) 30 MG 24 hr tablet Take 30 mg by mouth daily.    Marland Kitchen lenalidomide (REVLIMID) 10 MG capsule Take 1 capsule (10 mg total) by mouth daily. Take one capsule daily on days 1-14 every 21 days. 14 capsule 0  . loperamide (IMODIUM) 2 MG capsule Take 1 capsule (2 mg total) by mouth See admin instructions. With onset of diarrhea, take 37m followed by 262mevery 2 hours resolved. Maximum: 16 mg/day 120 capsule 0  . metFORMIN (GLUCOPHAGE) 500  MG tablet Take 500 mg by mouth 2 (two) times daily with a meal.    . metoprolol succinate (TOPROL-XL) 50 MG 24 hr tablet Take 50 mg by mouth daily. Take with or immediately following a meal.    . ondansetron (ZOFRAN) 8 MG tablet Take 1 tablet (8 mg total) 2 (two) times daily as needed by mouth (Nausea or vomiting). 30 tablet 1  . pantoprazole (PROTONIX) 40 MG tablet Take 1 tablet (40 mg total) by mouth 2 (two) times daily. 60 tablet 3  . triamcinolone cream (KENALOG) 0.1 % Apply 1 application topically 2 (two) times daily.     No current facility-administered medications for this visit.     Allergies as of 06/28/2017  . (No Known Allergies)    ROS:  General: Negative for anorexia, weight loss, fever, chills, fatigue, weakness. ENT: Negative for hoarseness, difficulty swallowing , nasal congestion. CV: Negative for chest pain, angina, palpitations, dyspnea on exertion, peripheral edema.  Respiratory: Negative for dyspnea at rest, dyspnea on exertion, cough, sputum, wheezing.  GI: See history of present illness. GU:  Negative for dysuria, hematuria, urinary incontinence, urinary frequency, nocturnal urination.  Endo: Negative for unusual weight change.    Physical Examination:   BP 115/71 (BP Location: Left Arm, Patient Position: Sitting, Cuff Size: Large)   Pulse 82   Temp (!) 97.5 F (36.4 C) (Oral)   Ht _0  (1.803 m)   Wt 232 lb 3.2 oz (105.3 kg)   BMI 32.39 kg/m   General: Well-nourished, well-developed in no acute distress.  Eyes: No icterus. Conjunctivae pink. Mouth: Oropharyngeal mucosa moist and pink , no lesions erythema or exudate. Lungs: Clear to auscultation bilaterally. Non-labored. Heart: Regular rate and rhythm, no murmurs rubs or gallops.  Abdomen: Bowel sounds are normal, nontender, nondistended, no hepatosplenomegaly or masses, no abdominal bruits or hernia , no rebound or guarding.   Extremities: No lower extremity edema. No clubbing or deformities. Neuro:  Alert and oriented x 3.  Grossly intact. Skin: Warm and dry, no jaundice.   Psych: Alert and cooperative, normal mood and affect.   Imaging Studies: No results found.  Assessment and Plan:   JeCayetano Mikitas a [age].o. y/o male here to see me back for iron deficiency anemia. H/o multple myeloma, radiation proctitis, CKD.   Plan  1. Urine analysis 2. Capsule study of the small bowel to r/o bleeding from AVM's of the small bowel. If negative can consider repeat sigmoidoscopy and APC of radiation proctitis.   Risks, benefits, alternatives of Givens capsule discussed with patient to include but not limited to the rare risk of Given's  capsule becoming lodged in the GI tract requiring surgical removal.  The patient agrees with this plan & consent will be obtained.   Dr [name]  MD,MRCP [facility]) Follow up in 3 months

## 2017-06-28 NOTE — Addendum Note (Signed)
Addended by: Peggye Ley on: 06/28/2017 03:58 PM   Modules accepted: Orders, SmartSet

## 2017-06-28 NOTE — Progress Notes (Signed)
Hematology/Oncology Follow Up Note [facility] Telephone:([phone]) [phone] [phone]  Patient Care Team: Jodi Marble, MD as PCP - General (Internal Medicine) Bary Castilla, Forest Gleason, MD (General Surgery) Edrick Kins, MD as Rounding Team (Internal Medicine) Hillary Bow, MD as Consulting Physician (Internal Medicine) [name], MD as Surgeon (Gastroenterology) Isaias Cowman, MD as Consulting Physician (Cardiology)  REFERRING PROVIDER: Dr.Lateef, [place] CHIEF COMPLAINTS/REASON FOR VISIT  Abnormal UPEP results, follow up for evaluation of monoclonal gammopathy  HISTORY OF PRESENTING ILLNESS:  77 y.o.  male with PMH listed below who presents to follow up on the evaluation and management of his abnormal urine protein electrophoresis results. I reviewed the records from [facility], [place] and [facility] was performed and HemOnc related medical problems are listed below.  He lives with a friend. He has 3 adult children.   1 Chronic Kidney disease, Stage II: patient follows up with Dr.Lateef.  2 Proteinuria:  02/18/2017: Albumin/Creatinine ratio was 7, Urine protein electrophoresis,Random Urine revealed M Spike 43.2%, total protein of 43.'7mg'$ /dl,  01/05/2017 Albumin/Creatinine ratio was 455.2, Albumin 996.5, , 2.15.2018 Albumin/Creatinine ratio was 80.3, Albumin 73.3, Urine protein electrophoresis,Random Urine revealed M Spike 29.7%, total protein of 29.'1mg'$ /dl, 06/30/2016 Serum protein electrophoresis did not detect M spike.  Autoimmune disorder work up showed Negative anti dsDNA, RNP antibodies, smith antibody, Sjogren antibodies, anti Jo-1, positive antichromotin antibodies, positive ANA,  3 Anemia of chronic kidney disease:  4 Iron deficiency anemia: history of iron deficiency, ferritin was 7 on 08/11/2016. He had EGD and colonoscopy that were done this year which showed  duodenitis/esphagitis/diverticulosis/benigh polyps. 5 Rheumatoid arthritis: he follows up with Dr.Kernodle and is on chronic MTX. He Is currently off MTX reports joint pains are controlled, not quite symptomatic.   Patient denies any persistent bone pain or any pain. He continue to feel lack of energy, persistent, not improved with resting or taking naps. He also has lost 25 pounds in the past year which was unintentional.   # Rheumatoid arthritis and currently is off MTX.  Current Treatment: RVD  INTERVAL HISTORY Patient presents for follow up for management of multiple myeloma. Today is day 8 of cycle 3 RVD, patient reports feeling well. He has been evaluated by GI for evaluation of blood in the stool. He is scheduled for capsule endoscopy this Friday. He was evaluated by [facility] bone marrow transplant team for autologous bone marrow transplant. He is not considered to  good candidate for transplant. Patient cardiologist Dr. Chancy Milroy had started patient on Lasix 20 mg daily. Lasix was discontinued by [facility] bone marrow transplant team as patient was having dizziness and borderline blood pressure during his clinic visit there. Patient denies feeling shortness of breath today.he denies any numbness or tingling of fingertips. Denies any diarrhea, chest pain, abdominal pain.   Review of Systems  Constitutional: Negative for appetite change, chills, fatigue and fever.  HENT:   Negative for hearing loss, lump/mass, sore throat and tinnitus.   Eyes: Negative for eye problems.  Respiratory: Negative for chest tightness, cough and hemoptysis.   Cardiovascular: Negative for chest pain.  Gastrointestinal: Negative for abdominal distention and diarrhea.  Endocrine: Negative for hot flashes.  Genitourinary: Negative for bladder incontinence, difficulty urinating, dyspareunia and frequency.   Musculoskeletal: Negative for arthralgias, back pain and gait problem.  Skin: Negative for itching and rash.    Neurological: Negative for dizziness, gait problem and seizures.  Hematological: Negative for adenopathy. Does not bruise/bleed easily.  Psychiatric/Behavioral: Negative  for confusion and depression. The patient is not nervous/anxious.      MEDICAL HISTORY:  Past Medical History:  Diagnosis Date  . Anxiety   . Arthritis    rheumatoid  . Atrial fibrillation (HCC)    hx of  . Cancer ([place])   . Chronic kidney disease   . Colon polyp 07-07-15   TUBULAR ADENOMA WITH AT LEAST HIGH-GRADE / Dr Rayann Heman  . Diabetes mellitus without complication ([place])   . Dysrhythmia    bradycardia...Marland KitchenMarland Kitchen2 to 1 heart block  . GERD (gastroesophageal reflux disease)   . Heart murmur    patient unaware of history of murmur  . Hypercholesterolemia   . Hypertension   . Presence of permanent cardiac pacemaker   . Prostate cancer ([place]) 01/01/13, 01/30/14   Gleason 3+4=7, volume 46.6 cc  . Rheumatoid arthritis ([place])   . S/P radiation therapy  04/03/2014 through 06/04/2014                                                      Prostate 7800 cGy in 40 sessions                          . Sleep apnea    uses cpap but it is not in the hospital with him    SURGICAL HISTORY: Past Surgical History:  Procedure Laterality Date  . ABDOMINAL AORTIC ANEURYSM REPAIR  11/2013  . COLON SURGERY  March 2017   Right hemicolectomy for tubulovillous adenoma with high-grade dysplasia.  . COLONOSCOPY WITH PROPOFOL N/A 07/07/2015   Procedure: COLONOSCOPY WITH PROPOFOL;  Surgeon: Josefine Class, MD;  Location: [facility] ENDOSCOPY;  Service: Endoscopy;  Laterality: N/A;  . COLONOSCOPY WITH PROPOFOL N/A 10/28/2016   Procedure: COLONOSCOPY WITH PROPOFOL;  Surgeon: [name], MD;  Location[facility] ENDOSCOPY;  Service: Endoscopy;  Laterality: N/A;  . ESOPHAGOGASTRODUODENOSCOPY (EGD) WITH PROPOFOL N/A 10/28/2016   Procedure: ESOPHAGOGASTRODUODENOSCOPY (EGD) WITH PROPOFOL;  Surgeon: [name], MD;  Location: [facility] ENDOSCOPY;  Service: Endoscopy;   Laterality: N/A;  . LAPAROSCOPIC RIGHT COLECTOMY Right 08/08/2015   Procedure: LAPAROSCOPIC RIGHT COLECTOMY;  Surgeon: Robert Bellow, MD;  Location: [facility] ORS;  Service: General;  Laterality: Right;  . NASAL SINUS SURGERY    . PACEMAKER INSERTION Left 08/26/2016   Procedure: INSERTION PACEMAKER;  Surgeon: Isaias Cowman, MD;  Location: [facility] ORS;  Service: Cardiovascular;  Laterality: Left;  . PROSTATE BIOPSY  01/01/13, 01/30/14   Gleason 3+3=6, vol 46.6 cc  . TONSILLECTOMY    . uvula surgery     for sleep apnea    SOCIAL HISTORY: Social History   Socioeconomic History  . Marital status: Divorced    Spouse name: Not on file  . Number of children: Not on file  . Years of education: Not on file  . Highest education level: Not on file  Social Needs  . Financial resource strain: Not on file  . Food insecurity - worry: Not on file  . Food insecurity - inability: Not on file  . Transportation needs - medical: Not on file  . Transportation needs - non-medical: Not on file  Occupational History  . Not on file  Tobacco Use  . Smoking status: Current Every Day Smoker    Packs/day: 0.50    Years: 50.00  Pack years: 25.00    Types: Cigarettes  . Smokeless tobacco: Never Used  . Tobacco comment: DOWN TO 1/2 PPD  Substance and Sexual Activity  . Alcohol use: Yes    Alcohol/week: 0.6 oz    Types: 1 Cans of beer per week    Comment: occasionally  . Drug use: No  . Sexual activity: Not Currently  Other Topics Concern  . Not on file  Social History Narrative  . Not on file    FAMILY HISTORY: Family History  Problem Relation Age of Onset  . Heart attack Mother   . Cirrhosis Father   . Pancreatic cancer Brother   . Diabetes Brother   . Diabetes Daughter        medication induced for cancer treatments  . Cancer Daughter        breast, brain    ALLERGIES:  has No Known Allergies.  MEDICATIONS:  Current Outpatient Medications  Medication Sig Dispense Refill  .  acetaminophen (TYLENOL) 325 MG tablet Take 325 mg by mouth every 6 (six) hours as needed.    Marland Kitchen acyclovir (ZOVIRAX) 400 MG tablet Take 1 tablet (400 mg total) 2 (two) times daily by mouth. 60 tablet 3  . amLODipine-benazepril (LOTREL) 5-20 MG capsule Take 1 capsule by mouth daily.    Marland Kitchen aspirin 81 MG chewable tablet Chew 81 mg by mouth daily.    Marland Kitchen atorvastatin (LIPITOR) 80 MG tablet Take 80 mg by mouth at bedtime.     . Baclofen 5 MG TABS Take 5 mg by mouth 3 (three) times daily as needed (hiccups). 21 tablet 0  . clopidogrel (PLAVIX) 75 MG tablet Take 75 mg by mouth daily.    Marland Kitchen dexamethasone (DECADRON) 4 MG tablet Take 5 tablets (20 mg) on days 1, 8, and 15 of chemo. Repeat every 21 days. Carepack. 15 tablet 3  . DEXAMETHASONE PO Take 20 mg by mouth See admin instructions.  3  . fexofenadine (ALLEGRA) 180 MG tablet Take 180 mg by mouth daily.    . fluticasone (FLONASE) 50 MCG/ACT nasal spray Place 2 sprays into both nostrils daily as needed for rhinitis.     . Fluticasone Furoate-Vilanterol (BREO ELLIPTA) 100-25 MCG/INH AEPB Inhale 1 puff into the lungs daily.     . folic acid (FOLVITE) 1 MG tablet Take 1 mg by mouth daily.    . furosemide (LASIX) 20 MG tablet Take 20 mg by mouth daily.    . isosorbide mononitrate (IMDUR) 30 MG 24 hr tablet Take 30 mg by mouth daily.    Marland Kitchen lenalidomide (REVLIMID) 10 MG capsule Take 1 capsule (10 mg total) by mouth daily. Take one capsule daily on days 1-14 every 21 days. 14 capsule 0  . loperamide (IMODIUM) 2 MG capsule Take 1 capsule (2 mg total) by mouth See admin instructions. With onset of diarrhea, take 47m followed by 258mevery 2 hours resolved. Maximum: 16 mg/day 120 capsule 0  . metFORMIN (GLUCOPHAGE) 500 MG tablet Take 500 mg by mouth 2 (two) times daily with a meal.    . metoprolol succinate (TOPROL-XL) 50 MG 24 hr tablet Take 50 mg by mouth daily. Take with or immediately following a meal.    . ondansetron (ZOFRAN) 8 MG tablet Take 1 tablet (8 mg total) 2  (two) times daily as needed by mouth (Nausea or vomiting). 30 tablet 1  . pantoprazole (PROTONIX) 40 MG tablet Take 1 tablet (40 mg total) by mouth 2 (two) times daily. 60 tablet  3  . triamcinolone cream (KENALOG) 0.1 % Apply 1 application topically 2 (two) times daily.     No current facility-administered medications for this visit.       Marland Kitchen  PHYSICAL EXAMINATION: ECOG PERFORMANCE STATUS: 1 - Symptomatic but completely ambulatory Vitals:   06/29/17 1438  BP: 107/63  Pulse: 79  Resp: 20  Temp: (!) 96.7 F (35.9 C)   Filed Weights   06/29/17 1438  Weight: 232 lb (105.2 kg)   Physical Exam  Constitutional: He is oriented to person, place, and time and well-developed, well-nourished, and in no distress. No distress.  HENT:  Head: Normocephalic.  Mouth/Throat: Oropharynx is clear and moist. No oropharyngeal exudate.  Eyes: Conjunctivae and EOM are normal. Pupils are equal, round, and reactive to light. Right eye exhibits no discharge. No scleral icterus.  Neck: Normal range of motion. Neck supple. No thyromegaly present.  Cardiovascular: Normal rate, regular rhythm and normal heart sounds. Exam reveals no friction rub.  No murmur heard. Pulmonary/Chest: Effort normal and breath sounds normal. No respiratory distress. He has no rales.  Abdominal: Soft. Bowel sounds are normal. He exhibits no distension. There is no rebound.  Musculoskeletal: Normal range of motion. He exhibits no edema.  Bilateral foot trace edema  Lymphadenopathy:    He has no cervical adenopathy.  Neurological: He is alert and oriented to person, place, and time. No cranial nerve deficit. Coordination normal.  Skin: Skin is dry. No rash noted. He is not diaphoretic. No erythema.  Psychiatric: Affect and judgment normal.    LABORATORY DATA:  I have reviewed the data as listed Lab Results  Component Value Date   WBC 7.5 06/22/2017   HGB 9.2 (L) 06/22/2017   HCT 30.4 (L) 06/22/2017   MCV 78.5 (L)  06/22/2017   PLT 231 06/22/2017   Recent Labs    06/08/17 1410 06/15/17 1420 06/22/17 1415  NA 140 141 140  K 4.0 4.3 4.0  CL 110 110 110  CO2 21* 24 21*  GLUCOSE 97 75 106*  BUN 19 24* 20  CREATININE 1.36* 1.57* 1.37*  CALCIUM 8.6* 8.3* 8.5*  GFRNONAA 49* 41* 49*  GFRAA 57* 48* 57*  PROT 6.4* 6.2* 6.7  ALBUMIN 3.3* 3.0* 3.4*  AST _0 ALT _1 ALKPHOS 64 62 65  BILITOT 0.5 0.5 0.5    SPEP: no M spike Serum free light chain Kappa/Lamda ratio 13.04 UPEP  Free light chain Kappa/lamda ratio 162.43, M spike 165m/24 hour   Bone marrow biopsy 03/21/2017  Bone Marrow, Aspirate,Biopsy, and Clot, left iliac - HYPERCELLULAR BONE MARROW FOR AGE WITH TRILINEAGE HEMATOPOIESIS. - PLASMACYTOSIS (PLASMA CELLS 8%)  Karyotype: loss of Y chromosome Cytogenetic MDS FISH Panel negative.   Bone marrow 04/06/2017  Bone Marrow, Aspirate,Biopsy, and Clot, right iliac and core - HYPERCELLULAR BONE MARROW FOR AGE WITH PLASMA CELL NEOPLASM - TRILINEAGE HEMATOPOIESIS. - SEE COMMENT. PERIPHERAL BLOOD: - MICROCYTIC-HYPOCHROMIC ANEMIA. Diagnosis Note The bone marrow is hypercellular with trilineage hematopoiesis but with relative abundance of erythroid precursors and increased number of megakaryocytes with nonspecific changes. Significant dyspoiesis is not seen. Iron stores are present with no ring sideroblasts. The plasma cells are increased in number representing 8% of all cells in the aspirate although focal areas in the core biopsy show 10 to 20% as primarily seen by CD138 stain. In situ hybridization for kappa and lambda light chains show kappa light chain restriction consistent with plasma cell neoplasm. Correlation with cytogenetic and FISH  studies is recommended. (BNS:ecj/gt 04/07/2017)  IMAGE STUDIES I have personally reviewed below image results.  03/15/2017 DG bone survey Met: Nonspecific lucent lesion in the distal right ulna measuring 15-16 mm, but otherwise normal bone  mineralization for age throughout the visible skeleton. A solitary lytic lesion in a distal extremity would be an unusual presentation of multiple myeloma, and I favor the distal right ulna lesion is benign. Recommend correlation with serum and urine protein electrophoresis.  04/14/2017 PET scan: 1. 2.1 cm hypermetabolic soft tissue lesion in the left heel, adjacent to the calcaneal tuberosity. 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 2. Mottled FDG accumulation diffusely in the marrow space without other frankly overt hypermetabolic bony lesion. 3. Coronary artery and thoracoabdominal aortic atherosclerosis with abdominal aortic stent graft visualized in situ. 4. Bilateral renal cysts and bilateral nonobstructing renal stones.    ASSESSMENT & PLAN:  1. Multiple myeloma, remission status unspecified ([place])   2. Acute on chronic renal insufficiency   3. Iron deficiency anemia, unspecified iron deficiency anemia type   4. Encounter for antineoplastic chemotherapy    # light chain Multiple Myeloma, Day 8 of cycle 3 RVD Revlimid 10 mg PO once per day on days 1 to 14 (dose reduced due to GFR) Revlimid renal dosing of 90m,  Velcade 1.3 mg/m2 IV once per day on days 1,  8, 15 Dexamethasone decreased to 20 mg PO once per day on days 1, 8, 15 given patient's diabetes  # Proceed Cycle 3 day 8 Velcade. Patient appears tolerating well without worsening of his pre-existing neuropathy. # Light chain ratio decreased. Ratio decreased from 13.04 to 3.3.   # shortness of breath and fatigue, likely secondary to symptomatic anemia, symptoms improved after PRBC transfusions lately. Today's hemoglobin stable.  # iron deficiency anemia,status post IV venofer, MCV improving. # chronic renal insufficiency stage 3,  Kidney function flucturates. Advise patient to continue oral hydration. [facility]  # continue Aspirin 873mand Plavix, close monitor platelet coutns.  # Continue acyclovir  for prophylaxis.  # Xgeva or zometa once he obtained dental clearance. Encourage patient to obtain until clearance. # [facility] of bone marrow transplant.  Patient will get cycle 3 day 15 of Velcade next week. I'll see patient in 2 weeks on day 1 of cycle 4. All questions were answered. The patient knows to call the clinic with any problems questions or concerns.  Return of visit: 2 weeks ZhEarlie ServerMD, PhD Hematology Oncology [facility]- [phone]/23/2019

## 2017-06-29 ENCOUNTER — Inpatient Hospital Stay (HOSPITAL_BASED_OUTPATIENT_CLINIC_OR_DEPARTMENT_OTHER): Payer: Medicare Other | Admitting: Oncology

## 2017-06-29 ENCOUNTER — Encounter: Payer: Self-pay | Admitting: Oncology

## 2017-06-29 ENCOUNTER — Inpatient Hospital Stay: Payer: Medicare Other

## 2017-06-29 VITALS — BP 107/63 | HR 79 | Temp 96.7°F | Resp 20 | Wt 232.0 lb

## 2017-06-29 DIAGNOSIS — C9000 Multiple myeloma not having achieved remission: Secondary | ICD-10-CM

## 2017-06-29 DIAGNOSIS — Z5111 Encounter for antineoplastic chemotherapy: Secondary | ICD-10-CM

## 2017-06-29 DIAGNOSIS — N183 Chronic kidney disease, stage 3 (moderate): Secondary | ICD-10-CM

## 2017-06-29 DIAGNOSIS — Z5112 Encounter for antineoplastic immunotherapy: Secondary | ICD-10-CM | POA: Diagnosis not present

## 2017-06-29 DIAGNOSIS — D509 Iron deficiency anemia, unspecified: Secondary | ICD-10-CM | POA: Diagnosis not present

## 2017-06-29 DIAGNOSIS — N289 Disorder of kidney and ureter, unspecified: Secondary | ICD-10-CM

## 2017-06-29 DIAGNOSIS — D631 Anemia in chronic kidney disease: Secondary | ICD-10-CM | POA: Diagnosis not present

## 2017-06-29 DIAGNOSIS — N189 Chronic kidney disease, unspecified: Secondary | ICD-10-CM

## 2017-06-29 LAB — CBC WITH DIFFERENTIAL/PLATELET
BASOS ABS: 0 10*3/uL (ref 0–0.1)
Basophils Relative: 0 %
Eosinophils Absolute: 0.6 10*3/uL (ref 0–0.7)
Eosinophils Relative: 8 %
HEMATOCRIT: 27.7 % — AB (ref 40.0–52.0)
Hemoglobin: 8.4 g/dL — ABNORMAL LOW (ref 13.0–18.0)
LYMPHS ABS: 0.7 10*3/uL — AB (ref 1.0–3.6)
LYMPHS PCT: 10 %
MCH: 24.4 pg — AB (ref 26.0–34.0)
MCHC: 30.5 g/dL — ABNORMAL LOW (ref 32.0–36.0)
MCV: 79.9 fL — AB (ref 80.0–100.0)
MONO ABS: 0.3 10*3/uL (ref 0.2–1.0)
MONOS PCT: 4 %
Neutro Abs: 5.5 10*3/uL (ref 1.4–6.5)
Neutrophils Relative %: 78 %
Platelets: 209 10*3/uL (ref 150–440)
RBC: 3.47 MIL/uL — ABNORMAL LOW (ref 4.40–5.90)
RDW: 29.1 % — AB (ref 11.5–14.5)
WBC: 7.1 10*3/uL (ref 3.8–10.6)

## 2017-06-29 LAB — URINALYSIS, COMPLETE (UACMP) WITH MICROSCOPIC
Bacteria, UA: NONE SEEN
Bilirubin Urine: NEGATIVE
GLUCOSE, UA: NEGATIVE mg/dL
Hgb urine dipstick: NEGATIVE
Ketones, ur: NEGATIVE mg/dL
Leukocytes, UA: NEGATIVE
Nitrite: NEGATIVE
PH: 5 (ref 5.0–8.0)
Protein, ur: NEGATIVE mg/dL
Specific Gravity, Urine: 1.015 (ref 1.005–1.030)
Squamous Epithelial / LPF: NONE SEEN

## 2017-06-29 LAB — COMPREHENSIVE METABOLIC PANEL
ALT: 19 U/L (ref 17–63)
AST: 21 U/L (ref 15–41)
Albumin: 3.2 g/dL — ABNORMAL LOW (ref 3.5–5.0)
Alkaline Phosphatase: 56 U/L (ref 38–126)
Anion gap: 6 (ref 5–15)
BILIRUBIN TOTAL: 0.6 mg/dL (ref 0.3–1.2)
BUN: 28 mg/dL — AB (ref 6–20)
CO2: 18 mmol/L — ABNORMAL LOW (ref 22–32)
CREATININE: 1.36 mg/dL — AB (ref 0.61–1.24)
Calcium: 8.1 mg/dL — ABNORMAL LOW (ref 8.9–10.3)
Chloride: 114 mmol/L — ABNORMAL HIGH (ref 101–111)
GFR, EST AFRICAN AMERICAN: 57 mL/min — AB (ref >60)
GFR, EST NON AFRICAN AMERICAN: 49 mL/min — AB (ref >60)
Glucose, Bld: 138 mg/dL — ABNORMAL HIGH (ref 65–99)
Potassium: 4 mmol/L (ref 3.5–5.1)
Sodium: 138 mmol/L (ref 135–145)
TOTAL PROTEIN: 6.2 g/dL — AB (ref 6.5–8.1)

## 2017-06-29 MED ORDER — BORTEZOMIB CHEMO SQ INJECTION 3.5 MG (2.5MG/ML)
1.3000 mg/m2 | Freq: Once | INTRAMUSCULAR | Status: AC
  Administered 2017-06-29: 2.75 mg via SUBCUTANEOUS
  Filled 2017-06-29: qty 2.75

## 2017-06-29 MED ORDER — PROCHLORPERAZINE MALEATE 10 MG PO TABS
10.0000 mg | ORAL_TABLET | Freq: Once | ORAL | Status: AC
  Administered 2017-06-29: 10 mg via ORAL
  Filled 2017-06-29: qty 1

## 2017-06-30 ENCOUNTER — Telehealth: Payer: Self-pay | Admitting: Gastroenterology

## 2017-06-30 NOTE — Telephone Encounter (Signed)
Please call patient regarding Capsule Study.

## 2017-07-01 ENCOUNTER — Inpatient Hospital Stay: Payer: Medicare Other

## 2017-07-01 VITALS — BP 106/72 | HR 72 | Temp 97.1°F | Resp 18 | Wt 227.2 lb

## 2017-07-01 DIAGNOSIS — Z5112 Encounter for antineoplastic immunotherapy: Secondary | ICD-10-CM | POA: Diagnosis not present

## 2017-07-01 DIAGNOSIS — D509 Iron deficiency anemia, unspecified: Secondary | ICD-10-CM

## 2017-07-01 SURGERY — IMAGING PROCEDURE, GI TRACT, INTRALUMINAL, VIA CAPSULE

## 2017-07-01 MED ORDER — IRON SUCROSE 20 MG/ML IV SOLN
200.0000 mg | Freq: Once | INTRAVENOUS | Status: AC
  Administered 2017-07-01: 200 mg via INTRAVENOUS
  Filled 2017-07-01: qty 10

## 2017-07-04 ENCOUNTER — Other Ambulatory Visit: Payer: Self-pay | Admitting: Internal Medicine

## 2017-07-04 DIAGNOSIS — J189 Pneumonia, unspecified organism: Secondary | ICD-10-CM

## 2017-07-04 DIAGNOSIS — J918 Pleural effusion in other conditions classified elsewhere: Principal | ICD-10-CM

## 2017-07-05 ENCOUNTER — Ambulatory Visit: Admission: RE | Admit: 2017-07-05 | Payer: Medicare Other | Source: Ambulatory Visit | Admitting: Gastroenterology

## 2017-07-06 ENCOUNTER — Inpatient Hospital Stay: Payer: Medicare Other

## 2017-07-06 VITALS — BP 98/63 | HR 98 | Temp 96.0°F | Resp 18

## 2017-07-06 DIAGNOSIS — Z5112 Encounter for antineoplastic immunotherapy: Secondary | ICD-10-CM | POA: Diagnosis not present

## 2017-07-06 DIAGNOSIS — C9000 Multiple myeloma not having achieved remission: Secondary | ICD-10-CM

## 2017-07-06 MED ORDER — PROCHLORPERAZINE MALEATE 10 MG PO TABS
10.0000 mg | ORAL_TABLET | Freq: Once | ORAL | Status: AC
  Administered 2017-07-06: 10 mg via ORAL
  Filled 2017-07-06: qty 1

## 2017-07-06 MED ORDER — BORTEZOMIB CHEMO SQ INJECTION 3.5 MG (2.5MG/ML)
1.3000 mg/m2 | Freq: Once | INTRAMUSCULAR | Status: AC
  Administered 2017-07-06: 2.75 mg via SUBCUTANEOUS
  Filled 2017-07-06: qty 2.75

## 2017-07-08 ENCOUNTER — Ambulatory Visit
Admission: RE | Admit: 2017-07-08 | Discharge: 2017-07-08 | Disposition: A | Discharge status: Home / Self Care | Payer: Medicare Other | Source: Ambulatory Visit | Attending: Internal Medicine | Admitting: Internal Medicine

## 2017-07-08 ENCOUNTER — Other Ambulatory Visit: Payer: Self-pay | Admitting: Internal Medicine

## 2017-07-08 DIAGNOSIS — J918 Pleural effusion in other conditions classified elsewhere: Secondary | ICD-10-CM | POA: Diagnosis present

## 2017-07-08 DIAGNOSIS — J189 Pneumonia, unspecified organism: Secondary | ICD-10-CM

## 2017-07-08 NOTE — Progress Notes (Signed)
  I was present during limited ultrasound of the left chest prior to possible thoracentesis.  Left pleural effusion was small. There was no significant pocket of fluid to allow for safe approach for thoracentesis.  I did not have previous imaging for comparison.  Samariyah Cowles S Amamda Curbow PA-C 07/08/2017 10:13 AM

## 2017-07-11 ENCOUNTER — Other Ambulatory Visit: Payer: Self-pay | Admitting: *Deleted

## 2017-07-11 ENCOUNTER — Ambulatory Visit: Payer: Medicare Other | Admitting: Podiatry

## 2017-07-11 ENCOUNTER — Encounter: Payer: Self-pay | Admitting: Podiatry

## 2017-07-11 DIAGNOSIS — M79609 Pain in unspecified limb: Secondary | ICD-10-CM | POA: Diagnosis not present

## 2017-07-11 DIAGNOSIS — B351 Tinea unguium: Secondary | ICD-10-CM | POA: Diagnosis not present

## 2017-07-11 DIAGNOSIS — E1142 Type 2 diabetes mellitus with diabetic polyneuropathy: Secondary | ICD-10-CM

## 2017-07-11 DIAGNOSIS — C9000 Multiple myeloma not having achieved remission: Secondary | ICD-10-CM

## 2017-07-11 DIAGNOSIS — I739 Peripheral vascular disease, unspecified: Secondary | ICD-10-CM

## 2017-07-11 MED ORDER — LENALIDOMIDE 10 MG PO CAPS: 10.0000 mg | ORAL_CAPSULE | Freq: Every day | ORAL | 0 refills | Status: DC

## 2017-07-11 NOTE — Progress Notes (Signed)
Complaint:  Visit Type: Patient returns to my office for continued preventative foot care services. Complaint: Patient states" my nails have grown long and thick and become painful to walk and wear shoes" Patient has been diagnosed with DM with pvd and neuropathy.. The patient presents for preventative foot care services. No changes to ROS.  Patient has been diagnosed with multiple myeloma.  Podiatric Exam: Vascular: dorsalis pedis are palpable  B/L. and posterior tibial pulses are not  palpable bilateral. Capillary return is immediate. Temperature gradient is WNL. Skin turgor WNL  Sensorium: Diminished  Semmes Weinstein monofilament test.  Nail Exam: Pt has thick disfigured discolored nails with subungual debris noted bilateral entire nail hallux through fifth toenails Ulcer Exam: There is no evidence of ulcer or pre-ulcerative changes or infection. Orthopedic Exam: Muscle tone and strength are WNL. No limitations in general ROM. No crepitus or effusions noted. Foot type and digits show no abnormalities. Bony prominences are unremarkable. HAV  B/L and hammer toe  B/L.  Pes planus. Skin: No Porokeratosis. No infection or ulcers  Diagnosis:  Onychomycosis, , Pain in right toe, pain in left toes  Treatment & Plan Procedures and Treatment: Consent by patient was obtained for treatment procedures. The patient understood the discussion of treatment and procedures well. All questions were answered thoroughly reviewed. Debridement of mycotic and hypertrophic toenails, 1 through 5 bilateral and clearing of subungual debris. No ulceration, no infection noted.  Return Visit-Office Procedure: Patient instructed to return to the office for a follow up visit 3 months for continued evaluation and treatment.    Gardiner Barefoot DPM

## 2017-07-12 ENCOUNTER — Other Ambulatory Visit: Payer: Self-pay | Admitting: Oncology

## 2017-07-12 DIAGNOSIS — D509 Iron deficiency anemia, unspecified: Secondary | ICD-10-CM

## 2017-07-12 NOTE — Progress Notes (Signed)
Hematology/Oncology Follow Up Note [facility] [phone] [phone]  Patient Care Team: Jodi Marble, MD as PCP - General (Internal Medicine) Bary Castilla, Forest Gleason, MD (General Surgery) Edrick Kins, MD as Rounding Team (Internal Medicine) Hillary Bow, MD as Consulting Physician (Internal Medicine) [name], MD as Surgeon (Gastroenterology) Isaias Cowman, MD as Consulting Physician (Cardiology)  REFERRING PROVIDER: Dr.Lateef, [place] CHIEF COMPLAINTS/REASON FOR VISIT  Abnormal UPEP results, follow up for evaluation of monoclonal gammopathy  HISTORY OF PRESENTING ILLNESS:  77 y.o.  male with PMH listed below who presents to follow up on the evaluation and management of his abnormal urine protein electrophoresis results. I reviewed the records from [facility], [place] and [place] was performed and HemOnc related medical problems are listed below.  He lives with a friend. He has 3 adult children.   1 Chronic Kidney disease, Stage III: patient follows up with Dr.Lateef.  2 Proteinuria:  02/18/2017: Albumin/Creatinine ratio was 7, Urine protein electrophoresis,Random Urine revealed M Spike 43.2%, total protein of 43.50m/dl,  01/05/2017 Albumin/Creatinine ratio was 455.2, Albumin 996.5, , 2.15.2018 Albumin/Creatinine ratio was 80.3, Albumin 73.3, Urine protein electrophoresis,Random Urine revealed M Spike 29.7%, total protein of 29.134mdl, 06/30/2016 Serum protein electrophoresis did not detect M spike.  Autoimmune disorder work up showed Negative anti dsDNA, RNP antibodies, smith antibody, Sjogren antibodies, anti Jo-1, positive antichromotin antibodies, positive ANA,  3 Anemia of chronic kidney disease:  4 Iron deficiency anemia: history of iron deficiency, ferritin was 7 on 08/11/2016. He had EGD and colonoscopy that were done this year which showed  duodenitis/esphagitis/diverticulosis/benigh polyps. 5 Rheumatoid arthritis: he follows up with Dr.Kernodle and is on chronic MTX. He Is currently off MTX reports joint pains are controlled, not quite symptomatic.   Patient denies any persistent bone pain or any pain. He continue to feel lack of energy, persistent, not improved with resting or taking naps. He also has lost 25 pounds in the past year which was unintentional.   # Rheumatoid arthritis and currently is off MTX. # [facility]. He is not considered to  good candidate for transplant. Patient cardiologist Dr. KaChancy Milroyad started patient on Lasix 20 mg daily. [facility] as patient was having dizziness and borderline blood pressure during his clinic visit there.  Current Treatment: RVD  INTERVAL HISTORY Patient presents for follow up for management of multiple myeloma. Today is Day 1 of cycle 4 RVD, patient reports feeling well. SOB has improved, although not completely resolved.  He has been evaluated by GI and plan is to proceed with capsule study.  Denies any worsening of his pre-existing neuropathy.  Denies any diarrhea, chest pain, abdominal pain. Appetite is good. He feels his arthritis also is improving.    Review of Systems  Constitutional: Negative for appetite change, chills, diaphoresis, fatigue and fever.  HENT:   Negative for hearing loss, lump/mass, mouth sores, sore throat and tinnitus.   Eyes: Negative for eye problems.  Respiratory: Negative for chest tightness, cough, hemoptysis and shortness of breath.   Cardiovascular: Negative for chest pain and leg swelling.  Gastrointestinal: Negative for abdominal distention, abdominal pain and diarrhea.  Endocrine: Negative for hot flashes.  Genitourinary: Negative for bladder incontinence, difficulty urinating, dyspareunia, dysuria and frequency.     Musculoskeletal: Negative for arthralgias, back pain, flank pain and gait problem.  Skin: Negative for itching and  rash.  Neurological: Negative for dizziness, gait problem, headaches and seizures.  Hematological: Negative for adenopathy. Does not bruise/bleed easily.  Psychiatric/Behavioral: Negative for confusion and depression. The patient is not nervous/anxious.      MEDICAL HISTORY:  Past Medical History:  Diagnosis Date  . Anxiety   . Arthritis    rheumatoid  . Atrial fibrillation (HCC)    hx of  . Cancer ([place])   . Chronic kidney disease   . Colon polyp 07-07-15   TUBULAR ADENOMA WITH AT LEAST HIGH-GRADE / Dr Rayann Heman  . Diabetes mellitus without complication ([place])   . Dysrhythmia    bradycardia...Marland KitchenMarland Kitchen2 to 1 heart block  . GERD (gastroesophageal reflux disease)   . Heart murmur    patient unaware of history of murmur  . Hypercholesterolemia   . Hypertension   . Presence of permanent cardiac pacemaker   . Prostate cancer ([place]) 01/01/13, 01/30/14   Gleason 3+4=7, volume 46.6 cc  . Rheumatoid arthritis ([place])   . S/P radiation therapy  04/03/2014 through 06/04/2014                                                      Prostate 7800 cGy in 40 sessions                          . Sleep apnea    uses cpap but it is not in the hospital with him    SURGICAL HISTORY: Past Surgical History:  Procedure Laterality Date  . ABDOMINAL AORTIC ANEURYSM REPAIR  11/2013  . COLON SURGERY  March 2017   Right hemicolectomy for tubulovillous adenoma with high-grade dysplasia.  . COLONOSCOPY WITH PROPOFOL N/A 07/07/2015   Procedure: COLONOSCOPY WITH PROPOFOL;  Surgeon: Josefine Class, MD;  Location[facility] ENDOSCOPY;  Service: Endoscopy;  Laterality: N/A;  . COLONOSCOPY WITH PROPOFOL N/A 10/28/2016   Procedure: COLONOSCOPY WITH PROPOFOL;  Surgeon: [name], MD;  Location: [facility] ENDOSCOPY;  Service: Endoscopy;  Laterality: N/A;  . ESOPHAGOGASTRODUODENOSCOPY (EGD) WITH PROPOFOL N/A 10/28/2016    Procedure: ESOPHAGOGASTRODUODENOSCOPY (EGD) WITH PROPOFOL;  Surgeon: [name], MD;  Location[facility] ENDOSCOPY;  Service: Endoscopy;  Laterality: N/A;  . LAPAROSCOPIC RIGHT COLECTOMY Right 08/08/2015   Procedure: LAPAROSCOPIC RIGHT COLECTOMY;  Surgeon: Robert Bellow, MD;  Location: [facility] ORS;  Service: General;  Laterality: Right;  . NASAL SINUS SURGERY    . PACEMAKER INSERTION Left 08/26/2016   Procedure: INSERTION PACEMAKER;  Surgeon: Isaias Cowman, MD;  Location: [facility] ORS;  Service: Cardiovascular;  Laterality: Left;  . PROSTATE BIOPSY  01/01/13, 01/30/14   Gleason 3+3=6, vol 46.6 cc  . TONSILLECTOMY    . uvula surgery     for sleep apnea    SOCIAL HISTORY: Social History   Socioeconomic History  . Marital status: Divorced    Spouse name: Not on file  . Number of children: Not on file  . Years of education: Not on file  . Highest education level: Not on file  Social Needs  . Financial resource strain: Not on file  . Food insecurity - worry: Not on file  . Food insecurity - inability: Not on file  . Transportation needs - medical: Not on file  . Transportation needs - non-medical: Not on file  Occupational History  . Not on file  Tobacco Use  . Smoking status: Current Every Day Smoker    Packs/day: 0.50    Years: 50.00    Pack years: 25.00    Types: Cigarettes  . Smokeless tobacco: Never Used  . Tobacco comment: DOWN TO 1/2 PPD  Substance and Sexual Activity  . Alcohol use: Yes    Alcohol/week: 0.6 oz    Types: 1 Cans of beer per week    Comment: occasionally  . Drug use: No  . Sexual activity: Not Currently  Other Topics Concern  . Not on file  Social History Narrative  . Not on file    FAMILY HISTORY: Family History  Problem Relation Age of Onset  . Heart attack Mother   . Cirrhosis Father   . Pancreatic cancer Brother   . Diabetes Brother   . Diabetes Daughter        medication induced for cancer treatments  . Cancer Daughter        breast, brain     ALLERGIES:  has No Known Allergies.  MEDICATIONS:  Current Outpatient Medications  Medication Sig Dispense Refill  . acetaminophen (TYLENOL) 325 MG tablet Take 325 mg by mouth every 6 (six) hours as needed.    Marland Kitchen acyclovir (ZOVIRAX) 400 MG tablet Take 1 tablet (400 mg total) 2 (two) times daily by mouth. 60 tablet 3  . amLODipine-benazepril (LOTREL) 5-20 MG capsule Take 1 capsule by mouth daily.    Marland Kitchen aspirin 81 MG chewable tablet Chew 81 mg by mouth daily.    Marland Kitchen atorvastatin (LIPITOR) 80 MG tablet Take 80 mg by mouth at bedtime.     . Baclofen 5 MG TABS Take 5 mg by mouth 3 (three) times daily as needed (hiccups). 21 tablet 0  . clopidogrel (PLAVIX) 75 MG tablet Take 75 mg by mouth daily.    Marland Kitchen dexamethasone (DECADRON) 4 MG tablet Take 5 tablets (20 mg) on days 1, 8, and 15 of chemo. Repeat every 21 days. Carepack. 15 tablet 3  . DEXAMETHASONE PO Take 20 mg by mouth See admin instructions.  3  . fexofenadine (ALLEGRA) 180 MG tablet Take 180 mg by mouth daily.    . fluticasone (FLONASE) 50 MCG/ACT nasal spray Place 2 sprays into both nostrils daily as needed for rhinitis.     . Fluticasone Furoate-Vilanterol (BREO ELLIPTA) 100-25 MCG/INH AEPB Inhale 1 puff into the lungs daily.     . folic acid (FOLVITE) 1 MG tablet Take 1 mg by mouth daily.    . furosemide (LASIX) 20 MG tablet Take 20 mg by mouth daily.    . isosorbide mononitrate (IMDUR) 30 MG 24 hr tablet Take 30 mg by mouth daily.    Marland Kitchen lenalidomide (REVLIMID) 10 MG capsule Take 1 capsule (10 mg total) by mouth daily. Take one capsule daily on days 1-14 every 21 days. 14 capsule 0  . loperamide (IMODIUM) 2 MG capsule Take 1 capsule (2 mg total) by mouth See admin instructions. With onset of diarrhea, take 59m followed by 275mevery 2 hours resolved. Maximum: 16 mg/day 120 capsule 0  . metFORMIN (GLUCOPHAGE) 500 MG tablet Take 500 mg by mouth 2 (two) times daily with a meal.    . metoprolol succinate (TOPROL-XL) 50 MG 24 hr tablet Take 50  mg by mouth daily. Take with or immediately following a meal.    . ondansetron (ZOFRAN) 8 MG tablet Take 1 tablet (8 mg total) 2 (two) times daily as needed by mouth (Nausea or vomiting). 30[place]  tablet 1  . pantoprazole (PROTONIX) 40 MG tablet Take 1 tablet (40 mg total) by mouth 2 (two) times daily. 60 tablet 3  . triamcinolone cream (KENALOG) 0.1 % Apply 1 application topically 2 (two) times daily.     No current facility-administered medications for this visit.       Marland Kitchen  PHYSICAL EXAMINATION: ECOG PERFORMANCE STATUS: 1 - Symptomatic but completely ambulatory Vitals:   07/13/17 1333  BP: 128/74  Pulse: 92  Temp: (!) 95 F (35 C)   Filed Weights   07/13/17 1333  Weight: 222 lb (100.7 kg)   Physical Exam  Constitutional: He is oriented to person, place, and time and well-developed, well-nourished, and in no distress. No distress.  HENT:  Head: Normocephalic.  Mouth/Throat: Oropharynx is clear and moist. No oropharyngeal exudate.  Eyes: Conjunctivae and EOM are normal. Pupils are equal, round, and reactive to light. Right eye exhibits no discharge. No scleral icterus.  Neck: Normal range of motion. Neck supple. No thyromegaly present.  Cardiovascular: Normal rate, regular rhythm and normal heart sounds. Exam reveals no friction rub.  No murmur heard. Pulmonary/Chest: Effort normal and breath sounds normal. No respiratory distress. He has no rales.  Abdominal: Soft. Bowel sounds are normal. He exhibits no distension. There is no rebound.  Musculoskeletal: Normal range of motion. He exhibits no edema.  Bilateral foot trace edema  Lymphadenopathy:    He has no cervical adenopathy.  Neurological: He is alert and oriented to person, place, and time. No cranial nerve deficit. Coordination normal.  Skin: Skin is dry. No rash noted. He is not diaphoretic. No erythema.  Psychiatric: Affect and judgment normal.    LABORATORY DATA:  I have reviewed the data as listed Lab Results    Component Value Date   WBC 7.1 06/29/2017   HGB 8.4 (L) 06/29/2017   HCT 27.7 (L) 06/29/2017   MCV 79.9 (L) 06/29/2017   PLT 209 06/29/2017   Recent Labs    06/15/17 1420 06/22/17 1415 06/29/17 1417  NA 141 140 138  K 4.3 4.0 4.0  CL 110 110 114*  CO2 24 21* 18*  GLUCOSE 75 106* 138*  BUN 24* 20 28*  CREATININE 1.57* 1.37* 1.36*  CALCIUM 8.3* 8.5* 8.1*  GFRNONAA 41* 49* 49*  GFRAA 48* 57* 57*  PROT 6.2* 6.7 6.2*  ALBUMIN 3.0* 3.4* 3.2*  AST '15 15 21  ' ALT '21 17 19  ' ALKPHOS 62 65 56  BILITOT 0.5 0.5 0.6    SPEP: no M spike Serum free light chain Kappa/Lamda ratio 13.04 UPEP  Free light chain Kappa/lamda ratio 162.43, M spike 117m/24 hour   Bone marrow biopsy 03/21/2017  Bone Marrow, Aspirate,Biopsy, and Clot, left iliac - HYPERCELLULAR BONE MARROW FOR AGE WITH TRILINEAGE HEMATOPOIESIS. - PLASMACYTOSIS (PLASMA CELLS 8%)  Karyotype: loss of Y chromosome Cytogenetic MDS FISH Panel negative.   Bone marrow 04/06/2017  Bone Marrow, Aspirate,Biopsy, and Clot, right iliac and core - HYPERCELLULAR BONE MARROW FOR AGE WITH PLASMA CELL NEOPLASM - TRILINEAGE HEMATOPOIESIS. - SEE COMMENT. PERIPHERAL BLOOD: - MICROCYTIC-HYPOCHROMIC ANEMIA. Diagnosis Note The bone marrow is hypercellular with trilineage hematopoiesis but with relative abundance of erythroid precursors and increased number of megakaryocytes with nonspecific changes. Significant dyspoiesis is not seen. Iron stores are present with no ring sideroblasts. The plasma cells are increased in number representing 8% of all cells in the aspirate although focal areas in the core biopsy show 10 to 20% as primarily seen by CD138 stain. In situ hybridization  for kappa and lambda light chains show kappa light chain restriction consistent with plasma cell neoplasm. Correlation with cytogenetic and FISH studies is recommended. (BNS:ecj/gt 04/07/2017)  IMAGE STUDIES I have personally reviewed below image results.  03/15/2017  DG bone survey Met: Nonspecific lucent lesion in the distal right ulna measuring 15-16 mm, but otherwise normal bone mineralization for age throughout the visible skeleton. A solitary lytic lesion in a distal extremity would be an unusual presentation of multiple myeloma, and I favor the distal right ulna lesion is benign. Recommend correlation with serum and urine protein electrophoresis.  04/14/2017 PET scan: 1. 2.1 cm hypermetabolic soft tissue lesion in the left heel, adjacent to the calcaneal tuberosity. 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 2. Mottled FDG accumulation diffusely in the marrow space without other frankly overt hypermetabolic bony lesion. 3. Coronary artery and thoracoabdominal aortic atherosclerosis with abdominal aortic stent graft visualized in situ. 4. Bilateral renal cysts and bilateral nonobstructing renal stones.    ASSESSMENT & PLAN:  1. Iron deficiency anemia, unspecified iron deficiency anemia type   2. Multiple myeloma, remission status unspecified ([place])   3. 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 Patient appears tolerating well without worsening of his pre-existing neuropathy. # Light chain ratio decreased. Ratio decreased from 13.04 to 3.3.   # shortness of breath and fatigue, likely secondary to anemia, symptoms improved after PRBC transfusions lately. Today's hemoglobin stable.  # iron deficiency anemia,status post IV venofer, MCV improving. Repeat iron panel showed borderline ferritin, low saturation, high TIBC,  today indicate that he may benefit from additional IV  iron. Plan venofer weekly x 4.  # chronic renal insufficiency stage 3,  Kidney function flucturates. Advise patient to continue oral hydration. [facility]  # continue Aspirin 846mand Plavix, close monitor platelet coutns.  # Continue acyclovir for prophylaxis.  # Xgeva or zometa once he obtained dental clearance. He has not obtained dental clearance yet.  # Plan repeat PET scan after 4 cycles of RVD.  It is ambiguous whether he has truly symptomatic multiple myeloma or a smoldering myeloma, given that anemia can be secondary to iron deficiency and CKD.  The hypermetabolic 2.1 cm hypermetabolic soft tissue lesion in the left heel, can reflect a Plasmacytoma,which should have been biopsied. However patient initially denies being on any blood thinner, later he was found to be on Plavix with pre biopsy questionnaire screening, and biopsy was held. Patient has to contact cardiologist to hold plavix for seven days before procedure. He feels frustrated about multiple workup and meanwhile he becomes more fatigues, with worsening of kidney function. Patient was reluctant about proceeding additional invasive procedures. So we started him on MM treatment.  Since he is finishing 4 cycles of RVD soon, plan repeat PET scan after 4 cycles of RVD and possible biopsy.   All questions were answered. The patient knows to call the clinic with any problems questions or concerns.  Return of visit: 2 weeks ZhEarlie ServerMD, PhD Hematology Oncology [facility]  [place] [facility] [place]- [phone] 07/13/2017

## 2017-07-13 ENCOUNTER — Inpatient Hospital Stay: Payer: Medicare Other | Attending: Oncology

## 2017-07-13 ENCOUNTER — Telehealth: Payer: Self-pay | Admitting: Oncology

## 2017-07-13 ENCOUNTER — Inpatient Hospital Stay: Payer: Medicare Other

## 2017-07-13 ENCOUNTER — Inpatient Hospital Stay (HOSPITAL_BASED_OUTPATIENT_CLINIC_OR_DEPARTMENT_OTHER): Payer: Medicare Other | Admitting: Oncology

## 2017-07-13 ENCOUNTER — Telehealth: Payer: Self-pay | Admitting: *Deleted

## 2017-07-13 ENCOUNTER — Encounter: Payer: Self-pay | Admitting: Oncology

## 2017-07-13 VITALS — BP 128/74 | HR 92 | Temp 95.0°F | Wt 222.0 lb

## 2017-07-13 DIAGNOSIS — Z7982 Long term (current) use of aspirin: Secondary | ICD-10-CM | POA: Insufficient documentation

## 2017-07-13 DIAGNOSIS — N183 Chronic kidney disease, stage 3 unspecified: Secondary | ICD-10-CM

## 2017-07-13 DIAGNOSIS — I4891 Unspecified atrial fibrillation: Secondary | ICD-10-CM | POA: Diagnosis not present

## 2017-07-13 DIAGNOSIS — F17210 Nicotine dependence, cigarettes, uncomplicated: Secondary | ICD-10-CM

## 2017-07-13 DIAGNOSIS — D509 Iron deficiency anemia, unspecified: Secondary | ICD-10-CM | POA: Insufficient documentation

## 2017-07-13 DIAGNOSIS — Z5112 Encounter for antineoplastic immunotherapy: Secondary | ICD-10-CM | POA: Diagnosis present

## 2017-07-13 DIAGNOSIS — D631 Anemia in chronic kidney disease: Secondary | ICD-10-CM | POA: Insufficient documentation

## 2017-07-13 DIAGNOSIS — M069 Rheumatoid arthritis, unspecified: Secondary | ICD-10-CM

## 2017-07-13 DIAGNOSIS — C9000 Multiple myeloma not having achieved remission: Secondary | ICD-10-CM | POA: Diagnosis not present

## 2017-07-13 LAB — FERRITIN: Ferritin: 54 ng/mL (ref 24–336)

## 2017-07-13 LAB — CBC WITH DIFFERENTIAL/PLATELET
BASOS ABS: 0.1 10*3/uL (ref 0–0.1)
Basophils Relative: 1 %
EOS ABS: 0.3 10*3/uL (ref 0–0.7)
Eosinophils Relative: 4 %
HEMATOCRIT: 27.7 % — AB (ref 40.0–52.0)
Hemoglobin: 8.6 g/dL — ABNORMAL LOW (ref 13.0–18.0)
LYMPHS ABS: 1.6 10*3/uL (ref 1.0–3.6)
Lymphocytes Relative: 25 %
MCH: 24.9 pg — ABNORMAL LOW (ref 26.0–34.0)
MCHC: 31.1 g/dL — AB (ref 32.0–36.0)
MCV: 80.2 fL (ref 80.0–100.0)
MONO ABS: 0.6 10*3/uL (ref 0.2–1.0)
Monocytes Relative: 9 %
NEUTROS ABS: 3.9 10*3/uL (ref 1.4–6.5)
Neutrophils Relative %: 61 %
PLATELETS: 187 10*3/uL (ref 150–440)
RBC: 3.45 MIL/uL — ABNORMAL LOW (ref 4.40–5.90)
RDW: 27.7 % — AB (ref 11.5–14.5)
Smear Review: ADEQUATE
WBC: 6.5 10*3/uL (ref 3.8–10.6)

## 2017-07-13 LAB — COMPREHENSIVE METABOLIC PANEL
ALBUMIN: 3.2 g/dL — AB (ref 3.5–5.0)
ALT: 23 U/L (ref 17–63)
AST: 18 U/L (ref 15–41)
Alkaline Phosphatase: 68 U/L (ref 38–126)
Anion gap: 7 (ref 5–15)
BILIRUBIN TOTAL: 0.4 mg/dL (ref 0.3–1.2)
BUN: 29 mg/dL — AB (ref 6–20)
CALCIUM: 8.4 mg/dL — AB (ref 8.9–10.3)
CO2: 24 mmol/L (ref 22–32)
Chloride: 109 mmol/L (ref 101–111)
Creatinine, Ser: 1.38 mg/dL — ABNORMAL HIGH (ref 0.61–1.24)
GFR calc Af Amer: 56 mL/min — ABNORMAL LOW (ref >60)
GFR, EST NON AFRICAN AMERICAN: 48 mL/min — AB (ref >60)
GLUCOSE: 136 mg/dL — AB (ref 65–99)
Potassium: 4.5 mmol/L (ref 3.5–5.1)
Sodium: 140 mmol/L (ref 135–145)
TOTAL PROTEIN: 6.3 g/dL — AB (ref 6.5–8.1)

## 2017-07-13 LAB — IRON AND TIBC
IRON: 16 ug/dL — AB (ref 45–182)
Saturation Ratios: 4 % — ABNORMAL LOW (ref 17.9–39.5)
TIBC: 407 ug/dL (ref 250–450)
UIBC: 391 ug/dL

## 2017-07-13 MED ORDER — BORTEZOMIB CHEMO SQ INJECTION 3.5 MG (2.5MG/ML)
1.3000 mg/m2 | Freq: Once | INTRAMUSCULAR | Status: AC
  Administered 2017-07-13: 2.75 mg via SUBCUTANEOUS
  Filled 2017-07-13: qty 2.75

## 2017-07-13 MED ORDER — PROCHLORPERAZINE MALEATE 10 MG PO TABS
10.0000 mg | ORAL_TABLET | Freq: Once | ORAL | Status: AC
  Administered 2017-07-13: 10 mg via ORAL
  Filled 2017-07-13: qty 1

## 2017-07-13 NOTE — Telephone Encounter (Signed)
Velcade in 1 week, per 07/13/17 los.  Lab/MD/VELCADE in 2 weeks, per 07/13/17. (dbl book @ 10:30 am, per Dr Noralee Stain)

## 2017-07-13 NOTE — Progress Notes (Signed)
Per patient statement, "My steroid pills are coming in the mail today and I usually take the pills at home. I don't take them here." MD, Dr. Tasia Catchings, notified via telephone. Per MD order: do not give PO Dexamethasone in clinic today. Once prescription has been delivered to patient's home today, patient instructed to take scheduled dose.

## 2017-07-13 NOTE — Telephone Encounter (Signed)
Per Almyra Free 07/13/17 staff message to schedule Venofer weekly x4 Called patient and made him aware of his appt.

## 2017-07-13 NOTE — Progress Notes (Signed)
Patient here today for follow up.   

## 2017-07-15 ENCOUNTER — Telehealth: Payer: Self-pay

## 2017-07-15 NOTE — Telephone Encounter (Signed)
Patient contacted office stated that he needs to reschedule a procedure.  I asked what procedure he was referring to and he said it was the Capsule Study-it looks like that was completed on 01/25.  Please see if you can further assist with this.  Thanks [employer]

## 2017-07-18 ENCOUNTER — Inpatient Hospital Stay: Payer: Medicare Other

## 2017-07-18 VITALS — BP 105/60 | HR 65 | Temp 96.0°F | Resp 18

## 2017-07-18 DIAGNOSIS — Z5112 Encounter for antineoplastic immunotherapy: Secondary | ICD-10-CM | POA: Diagnosis not present

## 2017-07-18 DIAGNOSIS — D509 Iron deficiency anemia, unspecified: Secondary | ICD-10-CM

## 2017-07-18 MED ORDER — IRON SUCROSE 20 MG/ML IV SOLN
200.0000 mg | Freq: Once | INTRAVENOUS | Status: AC
  Administered 2017-07-18: 200 mg via INTRAVENOUS
  Filled 2017-07-18: qty 10

## 2017-07-18 MED ORDER — SODIUM CHLORIDE 0.9 % IV SOLN
INTRAVENOUS | Status: DC
  Administered 2017-07-18: 14:00:00 via INTRAVENOUS
  Filled 2017-07-18: qty 1000

## 2017-07-20 ENCOUNTER — Ambulatory Visit
Admission: RE | Admit: 2017-07-20 | Discharge: 2017-07-20 | Disposition: A | Discharge status: Home / Self Care | Payer: Medicare Other | Source: Ambulatory Visit | Attending: Oncology | Admitting: Oncology

## 2017-07-20 ENCOUNTER — Inpatient Hospital Stay (HOSPITAL_BASED_OUTPATIENT_CLINIC_OR_DEPARTMENT_OTHER): Payer: Medicare Other | Admitting: Oncology

## 2017-07-20 ENCOUNTER — Inpatient Hospital Stay: Payer: Medicare Other

## 2017-07-20 VITALS — BP 119/73 | HR 80 | Temp 97.4°F | Resp 18 | Wt 229.6 lb

## 2017-07-20 DIAGNOSIS — M25471 Effusion, right ankle: Secondary | ICD-10-CM | POA: Diagnosis not present

## 2017-07-20 DIAGNOSIS — N183 Chronic kidney disease, stage 3 (moderate): Secondary | ICD-10-CM | POA: Diagnosis not present

## 2017-07-20 DIAGNOSIS — C9000 Multiple myeloma not having achieved remission: Secondary | ICD-10-CM | POA: Diagnosis not present

## 2017-07-20 DIAGNOSIS — M069 Rheumatoid arthritis, unspecified: Secondary | ICD-10-CM | POA: Diagnosis not present

## 2017-07-20 DIAGNOSIS — M25571 Pain in right ankle and joints of right foot: Secondary | ICD-10-CM

## 2017-07-20 DIAGNOSIS — D631 Anemia in chronic kidney disease: Secondary | ICD-10-CM

## 2017-07-20 DIAGNOSIS — Z5112 Encounter for antineoplastic immunotherapy: Secondary | ICD-10-CM | POA: Diagnosis not present

## 2017-07-20 MED ORDER — PROCHLORPERAZINE MALEATE 10 MG PO TABS
10.0000 mg | ORAL_TABLET | Freq: Once | ORAL | Status: AC
  Administered 2017-07-20: 10 mg via ORAL

## 2017-07-20 MED ORDER — BORTEZOMIB CHEMO SQ INJECTION 3.5 MG (2.5MG/ML)
1.3000 mg/m2 | Freq: Once | INTRAMUSCULAR | Status: AC
  Administered 2017-07-20: 2.75 mg via SUBCUTANEOUS
  Filled 2017-07-20: qty 2.75

## 2017-07-20 NOTE — Progress Notes (Signed)
Patient states, "I am having pain in my right ankle today when I walk and I usually don't have any pain there. My right ankle seems swollen. I have Arthritis and the Doctor recently took me off my medication for that. They told me to start taking Tylenol, but I haven't tried that yet. I have no pain when I'm sitting." Upon observation, bilateral ankle swelling present. -Rulon Abide, NP, notified and aware. NP coming to chairside to evaluate patient. NP to place further orders.

## 2017-07-20 NOTE — Progress Notes (Signed)
Symptom Management Consult note [facility]  [phone] [phone]  Patient Care Team: Jodi Marble, MD as PCP - General (Internal Medicine) Bary Castilla, Forest Gleason, MD (General Surgery) Edrick Kins, MD as Rounding Team (Internal Medicine) Hillary Bow, MD as Consulting Physician (Internal Medicine) [name], MD as Surgeon (Gastroenterology) Isaias Cowman, MD as Consulting Physician (Cardiology)   Name of the patient: Omar [name]  [id number]  Dec 27, 1940   Date of visit: 07/21/17  Diagnosis- Abnormal UPEP results, follow up for evaluation of monoclonal gammopathy  Chief complaint/ Reason for visit- Right ankle pain     Heme/Onc history: management of his abnormal urine protein electrophoresis results. I reviewed the records from [facility], [place] [facility] [place] was performed and HemOnc related medical problems are listed below.  He lives with a friend. He has 3 adult children.   1 Chronic Kidney disease, Stage III: patient follows up with Dr.Lateef.  2 Proteinuria:  02/18/2017: Albumin/Creatinine ratio was 7, Urine protein electrophoresis,Random Urine revealed M Spike 43.2%, total protein of 43.25m/dl,  01/05/2017 Albumin/Creatinine ratio was 455.2, Albumin 996.5, , 2.15.2018 Albumin/Creatinine ratio was 80.3, Albumin 73.3, Urine protein electrophoresis,Random Urine revealed M Spike 29.7%, total protein of 29.155mdl, 06/30/2016 Serum protein electrophoresis did not detect M spike.  Autoimmune disorder work up showed Negative anti dsDNA, RNP antibodies, smith antibody, Sjogren antibodies, anti Jo-1, positive antichromotin antibodies, positive ANA,  3 Anemia of chronic kidney disease:  4 Iron deficiency anemia: history of iron deficiency, ferritin was 7 on 08/11/2016. He had EGD and colonoscopy that were done this year which showed duodenitis/esphagitis/diverticulosis/benigh  polyps. 5 Rheumatoid arthritis: he follows up with Dr.Kernodle and is on chronic MTX. He Is currently off MTX reports joint pains are controlled, not quite symptomatic  Interval history- Patient was last seen by Dr. YuTasia Catchingsn 07/13/2017 where he continued to deny any persistent bone pain. He continued to be fatigued and taking frequent naps. He endorsed a 25 pound weight loss over the past year that was unintentional. He is currently not taking his methotrexate for his RA. He has been tolerating Velcade well and is starting fourth cycle.  He appeared to not have any worsening pre-existing neuropathy. Multiple myeloma labs were stable. He complained of mild shortness of breath that were thought to be secondary to anemia. The plan is to rescan after 4 complete cycles of Velcade with a possible biopsy.  Patient presents today for day 8 cycle 4 of Velcade. Patient states that beginning approximately 24 hours ago he developed right posterior ankle pain. Patient states he does not do a lot of ambulating so is uncertain of why he is having pain. States the pain is 10 out of 10 when applying weight. He is finding it hard to walk without a walker or cane. He also has noticed more swelling in bilateral lower extremities. He tries to keep legs elevated as much as possible. He endorses compliance with his Lasix 20 mg daily. States "I never have problems with swelling in my legs". He denies chest pain, diarrhea, constipation or urinary complaints.   ECOG FS:1 - Symptomatic but completely ambulatory  Review of systems- Review of Systems  Constitutional: Positive for malaise/fatigue (chronic). Negative for chills, fever and weight loss.  HENT: Negative.   Respiratory: Positive for shortness of breath (chronic).   Cardiovascular: Positive for leg swelling (bilateral lower extremities +1 pitting edema).  Gastrointestinal: Negative.   Genitourinary: Negative.   Musculoskeletal: Positive  for joint pain (history of RA).  Negative for falls.       Right ankle pain  Skin: Negative.   Neurological: Positive for weakness (chronic). Negative for dizziness, tingling and headaches.  Endo/Heme/Allergies: Negative.   Psychiatric/Behavioral: Negative.      Current treatment- Velcade  No Known Allergies   Past Medical History:  Diagnosis Date  . Anxiety   . Arthritis    rheumatoid  . Atrial fibrillation (HCC)    hx of  . Cancer ([place])   . Chronic kidney disease   . Colon polyp 07-07-15   TUBULAR ADENOMA WITH AT LEAST HIGH-GRADE / Dr Rayann Heman  . Diabetes mellitus without complication ([place])   . Dysrhythmia    bradycardia...Marland KitchenMarland Kitchen2 to 1 heart block  . GERD (gastroesophageal reflux disease)   . Heart murmur    patient unaware of history of murmur  . Hypercholesterolemia   . Hypertension   . Presence of permanent cardiac pacemaker   . Prostate cancer ([place]) 01/01/13, 01/30/14   Gleason 3+4=7, volume 46.6 cc  . Rheumatoid arthritis ([place])   . S/P radiation therapy  04/03/2014 through 06/04/2014                                                      Prostate 7800 cGy in 40 sessions                          . Sleep apnea    uses cpap but it is not in the hospital with him     Past Surgical History:  Procedure Laterality Date  . ABDOMINAL AORTIC ANEURYSM REPAIR  11/2013  . COLON SURGERY  March 2017   Right hemicolectomy for tubulovillous adenoma with high-grade dysplasia.  . COLONOSCOPY WITH PROPOFOL N/A 07/07/2015   Procedure: COLONOSCOPY WITH PROPOFOL;  Surgeon: Josefine Class, MD;  Location[facility] END[facility]COPY;  Service: Endoscopy;  Laterality: N/A;  . COLONOSCOPY WITH PROPOFOL N/A 10/28/2016   Procedure: COLONOSCOPY WITH PROPOFOL;  Surgeon: [name], MD;  Location: [name] ENDOSCOPY;  Service: Endoscopy;  Laterality: N/A;  . ESOPHAGOGASTRODUODENOSCOPY (EGD) WITH PROPOFOL N/A 10/28/2016   Procedure: ESOPHAGOGASTRODUODENOSCOPY (EGD) WITH PROPOFOL;  Surgeon: [name], MD;  Location: [facility] ENDOSCOPY;  Service:  Endoscopy;  Laterality: N/A;  . LAPAROSCOPIC RIGHT COLECTOMY Right 08/08/2015   Procedure: LAPAROSCOPIC RIGHT COLECTOMY;  Surgeon: Robert Bellow, MD;  Location: [facility] ORS;  Service: General;  Laterality: Right;  . NASAL SINUS SURGERY    . PACEMAKER INSERTION Left 08/26/2016   Procedure: INSERTION PACEMAKER;  Surgeon: Isaias Cowman, MD;  Location: [facility] ORS;  Service: Cardiovascular;  Laterality: Left;  . PROSTATE BIOPSY  01/01/13, 01/30/14   Gleason 3+3=6, vol 46.6 cc  . TONSILLECTOMY    . uvula surgery     for sleep apnea    Social History   Socioeconomic History  . Marital status: Divorced    Spouse name: Not on file  . Number of children: Not on file  . Years of education: Not on file  . Highest education level: Not on file  Social Needs  . Financial resource strain: Not on file  . Food insecurity - worry: Not on file  . Food insecurity - inability: Not on file  . Transportation needs - medical: Not on file  . Transportation needs -  non-medical: Not on file  Occupational History  . Not on file  Tobacco Use  . Smoking status: Current Every Day Smoker    Packs/day: 0.50    Years: 50.00    Pack years: 25.00    Types: Cigarettes  . Smokeless tobacco: Never Used  . Tobacco comment: DOWN TO 1/2 PPD  Substance and Sexual Activity  . Alcohol use: Yes    Alcohol/week: 0.6 oz    Types: 1 Cans of beer per week    Comment: occasionally  . Drug use: No  . Sexual activity: Not Currently  Other Topics Concern  . Not on file  Social History Narrative  . Not on file    Family History  Problem Relation Age of Onset  . Heart attack Mother   . Cirrhosis Father   . Pancreatic cancer Brother   . Diabetes Brother   . Diabetes Daughter        medication induced for cancer treatments  . Cancer Daughter        breast, brain     Current Outpatient Medications:  .  acetaminophen (TYLENOL) 325 MG tablet, Take 325 mg by mouth every 6 (six) hours as needed., Disp: , Rfl:  .   acyclovir (ZOVIRAX) 400 MG tablet, Take 1 tablet (400 mg total) 2 (two) times daily by mouth., Disp: 60 tablet, Rfl: 3 .  amLODipine-benazepril (LOTREL) 5-20 MG capsule, Take 1 capsule by mouth daily., Disp: , Rfl:  .  aspirin 81 MG chewable tablet, Chew 81 mg by mouth daily., Disp: , Rfl:  .  atorvastatin (LIPITOR) 80 MG tablet, Take 80 mg by mouth at bedtime. , Disp: , Rfl:  .  Baclofen 5 MG TABS, Take 5 mg by mouth 3 (three) times daily as needed (hiccups)., Disp: 21 tablet, Rfl: 0 .  clopidogrel (PLAVIX) 75 MG tablet, Take 75 mg by mouth daily., Disp: , Rfl:  .  dexamethasone (DECADRON) 4 MG tablet, Take 5 tablets (20 mg) on days 1, 8, and 15 of chemo. Repeat every 21 days. Carepack., Disp: 15 tablet, Rfl: 3 .  DEXAMETHASONE PO, Take 20 mg by mouth See admin instructions., Disp: , Rfl: 3 .  fexofenadine (ALLEGRA) 180 MG tablet, Take 180 mg by mouth daily., Disp: , Rfl:  .  fluticasone (FLONASE) 50 MCG/ACT nasal spray, Place 2 sprays into both nostrils daily as needed for rhinitis. , Disp: , Rfl:  .  Fluticasone Furoate-Vilanterol (BREO ELLIPTA) 100-25 MCG/INH AEPB, Inhale 1 puff into the lungs daily. , Disp: , Rfl:  .  furosemide (LASIX) 20 MG tablet, Take 20 mg by mouth daily., Disp: , Rfl:  .  isosorbide mononitrate (IMDUR) 30 MG 24 hr tablet, Take 30 mg by mouth daily., Disp: , Rfl:  .  lenalidomide (REVLIMID) 10 MG capsule, Take 1 capsule (10 mg total) by mouth daily. Take one capsule daily on days 1-14 every 21 days., Disp: 14 capsule, Rfl: 0 .  loperamide (IMODIUM) 2 MG capsule, Take 1 capsule (2 mg total) by mouth See admin instructions. With onset of diarrhea, take 55m followed by 219mevery 2 hours resolved. Maximum: 16 mg/day, Disp: 120 capsule, Rfl: 0 .  metFORMIN (GLUCOPHAGE) 500 MG tablet, Take 500 mg by mouth 2 (two) times daily with a meal., Disp: , Rfl:  .  metoprolol succinate (TOPROL-XL) 50 MG 24 hr tablet, Take 50 mg by mouth daily. Take with or immediately following a meal.,  Disp: , Rfl:  .  ondansetron (ZOFRAN) 8 MG  tablet, Take 1 tablet (8 mg total) 2 (two) times daily as needed by mouth (Nausea or vomiting)., Disp: 30 tablet, Rfl: 1 .  pantoprazole (PROTONIX) 40 MG tablet, Take 1 tablet (40 mg total) by mouth 2 (two) times daily., Disp: 60 tablet, Rfl: 3 .  triamcinolone cream (KENALOG) 0.1 %, Apply 1 application topically 2 (two) times daily., Disp: , Rfl:   Physical exam: There were no vitals filed for this visit. Physical Exam  Constitutional: He is oriented to person, place, and time and well-developed, well-nourished, and in no distress.  HENT:  Head: Normocephalic and atraumatic.  Eyes: Pupils are equal, round, and reactive to light.  Neck: Normal range of motion. Neck supple.  Cardiovascular: Normal rate and regular rhythm.  Pulmonary/Chest: Effort normal and breath sounds normal.  Abdominal: Soft. Bowel sounds are normal.  Musculoskeletal: Normal range of motion. He exhibits edema (+1 pitting edema bilateral lower extremities).       Right ankle: He exhibits swelling.       Feet:  Neurological: He is alert and oriented to person, place, and time.  Skin: Skin is warm and dry.     CMP Latest Ref Rng & Units 07/13/2017  Glucose 65 - 99 mg/dL 136(H)  BUN 6 - 20 mg/dL 29(H)  Creatinine 0.61 - 1.24 mg/dL 1.38(H)  Sodium 135 - 145 mmol/L 140  Potassium 3.5 - 5.1 mmol/L 4.5  Chloride 101 - 111 mmol/L 109  CO2 22 - 32 mmol/L 24  Calcium 8.9 - 10.3 mg/dL 8.4(L)  Total Protein 6.5 - 8.1 g/dL 6.3(L)  Total Bilirubin 0.3 - 1.2 mg/dL 0.4  Alkaline Phos 38 - 126 U/L 68  AST 15 - 41 U/L 18  ALT 17 - 63 U/L 23   CBC Latest Ref Rng & Units 07/13/2017  WBC 3.8 - 10.6 K/uL 6.5  Hemoglobin 13.0 - 18.0 g/dL 8.6(L)  Hematocrit 40.0 - 52.0 % 27.7(L)  Platelets 150 - 440 K/uL 187    No images are attached to the encounter.  Dg Ankle Complete Right  Result Date: 07/20/2017 CLINICAL DATA:  Acute right ankle pain. History of multiple myeloma. EXAM: RIGHT  ANKLE - COMPLETE 3+ VIEW COMPARISON:  None. FINDINGS: No acute fracture or dislocation. Well corticated ossific density at the tip of the medial malleolus is likely due to prior trauma. The talar dome is intact. The ankle mortise is symmetric. Small tibiotalar joint effusion. Small plantar enthesophyte. Bone mineralization is normal. Diffuse soft tissue swelling about the ankle. IMPRESSION: Small tibiotalar joint effusion.  No acute osseous abnormality. Electronically Signed   By: Titus Dubin M.D.   On: 07/20/2017 15:54   [place] Chest (pleural Effusion)  Result Date: 07/08/2017 CLINICAL DATA:  Right pleural effusion. EXAM: CHEST ULTRASOUND COMPARISON:  None. FINDINGS: There is no evidence of a right or left pleural effusion. Thoracentesis was not performed. IMPRESSION: No evidence of right or left pleural effusion. Electronically Signed   By: Marybelle Killings M.D.   On: 07/08/2017 10:15     Assessment and plan- Patient is a 77 y.o. male who presents for weekly Velcade. Currently cycle 4 day 8. Tolerating well. Complains of right ankle pain beginning yesterday/last night. Does not endorse injury to area. Area is swollen with +1 pitting edema. It is warm to touch. No obvious deformity. Patient states pain is 10 of 10 when weight-bearing. Vital signs are stable. He is afebrile.  1. Right ankle pain: Stat x-ray of right ankle. We will see what these results  show. Possibly could be from history of RA. Recommended he try OTC Tylenol. Results: results show small tibiotalar joint effusion. No acute osseous abnormality.  2. Bilateral lower extremity edema: Patient currently on 20 mg Lasix daily. Will talk to Dr. Tasia Catchings to see if we can increase this for a few days.  3. GI capsule study: Patient has called Dr. Vicente Males office and they have not returned his phone call to schedule an appointment for his capsule study. Patient is very irritated that no one will return his phone call. Will try to facilitate getting him an  appointment. Patient was called this morning to make an appointment for his capsule study. 4. RTC as scheduled to follow-up with Dr. Tasia Catchings.   Addendum: Spoke to patient this morning to inform him of his ankle x-ray results. He is happy to hear that there is no injury to his ankle. He states his ankle feels much better after starting Tylenol. He continues to keep his feet elevated. He does not offer any other concerns today. Recommend patient continue Tylenol when necessary for pain.    Visit Diagnosis 1. Acute right ankle pain   2. Multiple myeloma, remission status unspecified ([place])     Patient expressed understanding and was in agreement with this plan. He also understands that He can call clinic at any time with any questions, concerns, or complaints.   Greater than 50% was spent in counseling and coordination of care with this patient including but not limited to discussion of the relevant topics above (See A&P) including, but not limited to diagnosis and management of acute and chronic medical conditions.    Faythe Casa, AGNP-C [facility]- [phone] Pager- [phone] 07/21/2017 10:51 AM

## 2017-07-21 ENCOUNTER — Other Ambulatory Visit: Payer: Self-pay

## 2017-07-21 DIAGNOSIS — D500 Iron deficiency anemia secondary to blood loss (chronic): Secondary | ICD-10-CM

## 2017-07-22 ENCOUNTER — Other Ambulatory Visit: Payer: Self-pay | Admitting: Oncology

## 2017-07-25 ENCOUNTER — Encounter
Admission: RE | Disposition: A | Discharge status: Home / Self Care | Payer: Self-pay | Source: Ambulatory Visit | Attending: Gastroenterology

## 2017-07-25 ENCOUNTER — Ambulatory Visit
Admission: RE | Admit: 2017-07-25 | Discharge: 2017-07-25 | Disposition: A | Discharge status: Home / Self Care | Payer: Medicare Other | Source: Ambulatory Visit | Attending: Gastroenterology | Admitting: Gastroenterology

## 2017-07-25 ENCOUNTER — Inpatient Hospital Stay: Payer: Medicare Other

## 2017-07-25 VITALS — BP 103/70 | HR 66 | Temp 96.8°F | Resp 18

## 2017-07-25 DIAGNOSIS — Z5112 Encounter for antineoplastic immunotherapy: Secondary | ICD-10-CM | POA: Diagnosis not present

## 2017-07-25 DIAGNOSIS — D500 Iron deficiency anemia secondary to blood loss (chronic): Secondary | ICD-10-CM

## 2017-07-25 DIAGNOSIS — D509 Iron deficiency anemia, unspecified: Secondary | ICD-10-CM | POA: Diagnosis not present

## 2017-07-25 DIAGNOSIS — Q2733 Arteriovenous malformation of digestive system vessel: Secondary | ICD-10-CM | POA: Insufficient documentation

## 2017-07-25 HISTORY — PX: GIVENS CAPSULE STUDY: SHX5432

## 2017-07-25 SURGERY — IMAGING PROCEDURE, GI TRACT, INTRALUMINAL, VIA CAPSULE

## 2017-07-25 MED ORDER — IRON SUCROSE 20 MG/ML IV SOLN
200.0000 mg | Freq: Once | INTRAVENOUS | Status: AC
  Administered 2017-07-25: 200 mg via INTRAVENOUS
  Filled 2017-07-25: qty 10

## 2017-07-25 MED ORDER — SODIUM CHLORIDE 0.9 % IV SOLN
Freq: Once | INTRAVENOUS | Status: AC
  Administered 2017-07-25: 15:00:00 via INTRAVENOUS
  Filled 2017-07-25: qty 1000

## 2017-07-26 ENCOUNTER — Encounter: Payer: Self-pay | Admitting: Certified Registered Nurse Anesthetist

## 2017-07-26 ENCOUNTER — Encounter: Payer: Self-pay | Admitting: Gastroenterology

## 2017-07-26 NOTE — Progress Notes (Signed)
Hematology/Oncology Follow Up Note [facility] [phone] [phone]  Patient Care Team: Jodi Marble, MD as PCP - General (Internal Medicine) Bary Castilla, Forest Gleason, MD (General Surgery) Edrick Kins, MD as Rounding Team (Internal Medicine) Hillary Bow, MD as Consulting Physician (Internal Medicine) [name], MD as Surgeon (Gastroenterology) Isaias Cowman, MD as Consulting Physician (Cardiology)  REFERRING PROVIDER: Dr.Lateef, [place] CHIEF COMPLAINTS/REASON FOR VISIT  Abnormal UPEP results, follow up for evaluation of monoclonal gammopathy  HISTORY OF PRESENTING ILLNESS:  77 y.o.  male with PMH listed below who presents to follow up on the evaluation and management of his abnormal urine protein electrophoresis results. I reviewed the records from [facility], [place] and [place] was performed and HemOnc related medical problems are listed below.  He lives with a friend. He has 3 adult children.   1 Chronic Kidney disease, Stage III: patient follows up with Dr.Lateef.  2 Proteinuria:  02/18/2017: Albumin/Creatinine ratio was 7, Urine protein electrophoresis,Random Urine revealed M Spike 43.2%, total protein of 43.12m/dl,  01/05/2017 Albumin/Creatinine ratio was 455.2, Albumin 996.5, , 2.15.2018 Albumin/Creatinine ratio was 80.3, Albumin 73.3, Urine protein electrophoresis,Random Urine revealed M Spike 29.7%, total protein of 29.142mdl, 06/30/2016 Serum protein electrophoresis did not detect M spike.  Autoimmune disorder work up showed Negative anti dsDNA, RNP antibodies, smith antibody, Sjogren antibodies, anti Jo-1, positive antichromotin antibodies, positive ANA,  3 Anemia of chronic kidney disease:  4 Iron deficiency anemia: history of iron deficiency, ferritin was 7 on 08/11/2016. He had EGD and colonoscopy that were done this year which showed  duodenitis/esphagitis/diverticulosis/benigh polyps. 5 Rheumatoid arthritis: he follows up with Dr.Kernodle and is on chronic MTX. He Is currently off MTX reports joint pains are controlled, not quite symptomatic.   Patient denies any persistent bone pain or any pain. He continue to feel lack of energy, persistent, not improved with resting or taking naps. He also has lost 25 pounds in the past year which was unintentional.   # Rheumatoid arthritis and currently is off MTX.  # It is ambiguous whether he has truly symptomatic multiple myeloma or a smoldering myeloma, given that anemia can be secondary to iron deficiency and CKD.  The hypermetabolic 2.1 cm hypermetabolic soft tissue lesion in the left heel, can reflect a Plasmacytoma,which should have been biopsied. However patient initially denies being on any blood thinner, later he was found to be on Plavix with pre biopsy questionnaire screening, and biopsy was held. Patient has to contact cardiologist to hold plavix for seven days before procedure. He feels frustrated about multiple workup and meanwhile he becomes more fatigues, with worsening of kidney function. Patient was reluctant about proceeding additional invasive procedures and wants to start treatment. Decision was made to start active MM treatment.   # [facility] bone marrow transplant. He is not considered to  good candidate for transplant. Patient cardiologist Dr. KaChancy Milroyad started patient on Lasix 20 mg daily. [facility] as patient was having dizziness and borderline blood pressure during his clinic visit there.  Current Treatment:  RVD x 4   INTERVAL HISTORY Patient presents for follow up for management of multiple myeloma. Today is Day 15 of cycle 4 RVD. Patient reports no worsening of numbness or tingling of his fingertips. Chronic SOB with exertion, breathing well at rest. Denies  any diarrhea, chest pain, nausea or vomiting, dizziness. He has  lower extremity swelling which is the worst at the end of day , but resolved in the morning.  He has been evaluated by GI and s/p capsule study.  Denies any worsening of his pre-existing neuropathy.  .    Review of Systems  Constitutional: Negative for appetite change, chills, diaphoresis, fatigue and fever.  HENT:   Negative for hearing loss, lump/mass, mouth sores, sore throat and tinnitus.   Eyes: Negative for eye problems.  Respiratory: Negative for chest tightness, cough and hemoptysis.        SOB with exertion..  Cardiovascular: Negative for chest pain and leg swelling.  Gastrointestinal: Negative for abdominal distention, abdominal pain, constipation and diarrhea.  Endocrine: Negative for hot flashes.  Genitourinary: Negative for bladder incontinence, difficulty urinating, dyspareunia, dysuria and frequency.   Musculoskeletal: Negative for arthralgias, back pain, flank pain and gait problem.  Skin: Negative for itching and rash.  Neurological: Negative for dizziness, gait problem, headaches and seizures.  Hematological: Negative for adenopathy. Does not bruise/bleed easily.  Psychiatric/Behavioral: Negative for confusion and depression. The patient is not nervous/anxious.      MEDICAL HISTORY:  Past Medical History:  Diagnosis Date  . Anxiety   . Arthritis    rheumatoid  . Atrial fibrillation (HCC)    hx of  . Cancer ([place])   . Chronic kidney disease   . Colon polyp 07-07-15   TUBULAR ADENOMA WITH AT LEAST HIGH-GRADE / Dr Rayann Heman  . Diabetes mellitus without complication ([place])   . Dysrhythmia    bradycardia...Marland KitchenMarland Kitchen2 to 1 heart block  . GERD (gastroesophageal reflux disease)   . Heart murmur    patient unaware of history of murmur  . Hypercholesterolemia   . Hypertension   . Presence of permanent cardiac pacemaker   . Prostate cancer ([place]) 01/01/13, 01/30/14   Gleason 3+4=7, volume 46.6 cc  . Rheumatoid arthritis  ([place])   . S/P radiation therapy  04/03/2014 through 06/04/2014                                                      Prostate 7800 cGy in 40 sessions                          . Sleep apnea    uses cpap but it is not in the hospital with him    SURGICAL HISTORY: Past Surgical History:  Procedure Laterality Date  . ABDOMINAL AORTIC ANEURYSM REPAIR  11/2013  . COLON SURGERY  March 2017   Right hemicolectomy for tubulovillous adenoma with high-grade dysplasia.  . COLONOSCOPY WITH PROPOFOL N/A 07/07/2015   Procedure: COLONOSCOPY WITH PROPOFOL;  Surgeon: Josefine Class, MD;  Location: [facility] ENDOSCOPY;  Service: Endoscopy;  Laterality: N/A;  . COLONOSCOPY WITH PROPOFOL N/A 10/28/2016   Procedure: COLONOSCOPY WITH PROPOFOL;  Surgeon: [name], MD;  Location[facility] ENDOSCOPY;  Service: Endoscopy;  Laterality: N/A;  . ESOPHAGOGASTRODUODENOSCOPY (EGD) WITH PROPOFOL N/A 10/28/2016   Procedure: ESOPHAGOGASTRODUODENOSCOPY (EGD) WITH PROPOFOL;  Surgeon: [name], MD;  Location: [facility] ENDOSCOPY;  Service: Endoscopy;  Laterality: N/A;  . GIVENS CAPSULE STUDY N/A 07/25/2017   Procedure: GIVENS CAPSULE STUDY;  Surgeon: [name], MD;  Location: [facility] ENDOSCOPY;  Service: Gastroenterology;  Laterality: N/A;  . LAPAROSCOPIC RIGHT COLECTOMY Right 08/08/2015   Procedure: LAPAROSCOPIC RIGHT COLECTOMY;  Surgeon: Robert Bellow, MD;  Location: [facility] ORS;  Service: General;  Laterality: Right;  . NASAL SINUS SURGERY    . PACEMAKER INSERTION Left 08/26/2016   Procedure: INSERTION PACEMAKER;  Surgeon: Isaias Cowman, MD;  Location: [facility] ORS;  Service: Cardiovascular;  Laterality: Left;  . PROSTATE BIOPSY  01/01/13, 01/30/14   Gleason 3+3=6, vol 46.6 cc  . TONSILLECTOMY    . uvula surgery     for sleep apnea    SOCIAL HISTORY: Social History   Socioeconomic History  . Marital status: Divorced    Spouse name: Not on file  . Number of children: Not on file  . Years of education: Not on file  . Highest  education level: Not on file  Social Needs  . Financial resource strain: Not on file  . Food insecurity - worry: Not on file  . Food insecurity - inability: Not on file  . Transportation needs - medical: Not on file  . Transportation needs - non-medical: Not on file  Occupational History  . Not on file  Tobacco Use  . Smoking status: Current Every Day Smoker    Packs/day: 0.50    Years: 50.00    Pack years: 25.00    Types: Cigarettes  . Smokeless tobacco: Never Used  . Tobacco comment: DOWN TO 1/2 PPD  Substance and Sexual Activity  . Alcohol use: Yes    Alcohol/week: 0.6 oz    Types: 1 Cans of beer per week    Comment: occasionally  . Drug use: No  . Sexual activity: Not Currently  Other Topics Concern  . Not on file  Social History Narrative  . Not on file    FAMILY HISTORY: Family History  Problem Relation Age of Onset  . Heart attack Mother   . Cirrhosis Father   . Pancreatic cancer Brother   . Diabetes Brother   . Diabetes Daughter        medication induced for cancer treatments  . Cancer Daughter        breast, brain    ALLERGIES:  has No Known Allergies.  MEDICATIONS:  Current Outpatient Medications  Medication Sig Dispense Refill  . acetaminophen (TYLENOL) 325 MG tablet Take 325 mg by mouth every 6 (six) hours as needed.    Marland Kitchen acyclovir (ZOVIRAX) 400 MG tablet Take 1 tablet (400 mg total) 2 (two) times daily by mouth. 60 tablet 3  . amLODipine-benazepril (LOTREL) 5-20 MG capsule Take 1 capsule by mouth daily.    Marland Kitchen aspirin 81 MG chewable tablet Chew 81 mg by mouth daily.    Marland Kitchen atorvastatin (LIPITOR) 80 MG tablet Take 80 mg by mouth at bedtime.     . Baclofen 5 MG TABS Take 5 mg by mouth 3 (three) times daily as needed (hiccups). 21 tablet 0  . dexamethasone (DECADRON) 4 MG tablet Take 5 tablets (20 mg) on days 1, 8, and 15 of chemo. Repeat every 21 days. Carepack. 15 tablet 3  . DEXAMETHASONE PO Take 20 mg by mouth See admin instructions.  3  .  fexofenadine (ALLEGRA) 180 MG tablet Take 180 mg by mouth daily.    . fluticasone (FLONASE) 50 MCG/ACT nasal spray Place 2 sprays into both nostrils daily as needed for rhinitis.     . Fluticasone Furoate-Vilanterol (BREO ELLIPTA) 100-25 MCG/INH AEPB Inhale 1 puff into the lungs daily.     . furosemide (LASIX) 20 MG tablet Take 20 mg by mouth daily.    . isosorbide mononitrate (  IMDUR) 30 MG 24 hr tablet Take 30 mg by mouth daily.    Marland Kitchen lenalidomide (REVLIMID) 10 MG capsule Take 1 capsule (10 mg total) by mouth daily. Take one capsule daily on days 1-14 every 21 days. 14 capsule 0  . loperamide (IMODIUM) 2 MG capsule Take 1 capsule (2 mg total) by mouth See admin instructions. With onset of diarrhea, take 64m followed by 223mevery 2 hours resolved. Maximum: 16 mg/day 120 capsule 0  . metFORMIN (GLUCOPHAGE) 500 MG tablet Take 500 mg by mouth 2 (two) times daily with a meal.    . metoprolol succinate (TOPROL-XL) 50 MG 24 hr tablet Take 50 mg by mouth daily. Take with or immediately following a meal.    . ondansetron (ZOFRAN) 8 MG tablet Take 1 tablet (8 mg total) 2 (two) times daily as needed by mouth (Nausea or vomiting). 30 tablet 1  . pantoprazole (PROTONIX) 40 MG tablet Take 1 tablet (40 mg total) by mouth 2 (two) times daily. 60 tablet 3  . triamcinolone cream (KENALOG) 0.1 % Apply 1 application topically 2 (two) times daily.    . clopidogrel (PLAVIX) 75 MG tablet Take 75 mg by mouth daily.     No current facility-administered medications for this visit.       . Marland KitchenPHYSICAL EXAMINATION: ECOG PERFORMANCE STATUS: 1 - Symptomatic but completely ambulatory Vitals:   07/27/17 1027 07/27/17 1034  BP:  (!) 103/55  Pulse:  63  Resp: 16   Temp:  98.1 F (36.7 C)   Filed Weights   07/27/17 1027  Weight: 232 lb 11.2 oz (105.6 kg)   Physical Exam  Constitutional: He is oriented to person, place, and time and well-developed, well-nourished, and in no distress. No distress.  HENT:  Head:  Normocephalic.  Mouth/Throat: Oropharynx is clear and moist. No oropharyngeal exudate.  Eyes: Conjunctivae and EOM are normal. Pupils are equal, round, and reactive to light. Right eye exhibits no discharge. No scleral icterus.  Neck: Normal range of motion. Neck supple. No thyromegaly present.  Cardiovascular: Normal rate, regular rhythm and normal heart sounds. Exam reveals no friction rub.  No murmur heard. Pulmonary/Chest: Effort normal and breath sounds normal. No respiratory distress. He has no rales.  Abdominal: Soft. Bowel sounds are normal. He exhibits no distension. There is no rebound.  Musculoskeletal: Normal range of motion. He exhibits edema.  Bilateral 1+ LE edema.   Lymphadenopathy:    He has no cervical adenopathy.  Neurological: He is alert and oriented to person, place, and time. No cranial nerve deficit. Coordination normal.  Skin: Skin is dry. No rash noted. He is not diaphoretic. No erythema.  Psychiatric: Mood, affect and judgment normal.    LABORATORY DATA:  I have reviewed the data as listed Lab Results  Component Value Date   WBC 8.0 07/27/2017   HGB 9.1 (L) 07/27/2017   HCT 29.1 (L) 07/27/2017   MCV 80.2 07/27/2017   PLT 146 (L) 07/27/2017   Recent Labs    06/29/17 1417 07/13/17 1320 07/27/17 1024  NA 138 140 140  K 4.0 4.5 4.2  CL 114* 109 113*  CO2 18* 24 20*  GLUCOSE 138* 136* 107*  BUN 28* 29* 25*  CREATININE 1.36* 1.38* 1.45*  CALCIUM 8.1* 8.4* 8.5*  GFRNONAA 49* 48* 46*  GFRAA 57* 56* 53*  PROT 6.2* 6.3* 6.2*  ALBUMIN 3.2* 3.2* 3.3*  AST '21 18 17  ' ALT 19 23 16*  ALKPHOS 56 68 65  BILITOT  0.6 0.4 0.5    SPEP: no M spike Serum free light chain Kappa/Lamda ratio 13.04 UPEP  Free light chain Kappa/lamda ratio 162.43, M spike 158m/24 hour   Bone marrow biopsy 03/21/2017  Bone Marrow, Aspirate,Biopsy, and Clot, left iliac - HYPERCELLULAR BONE MARROW FOR AGE WITH TRILINEAGE HEMATOPOIESIS. - PLASMACYTOSIS (PLASMA CELLS  8%)  Karyotype: loss of Y chromosome Cytogenetic MDS FISH Panel negative.   Bone marrow 04/06/2017  Bone Marrow, Aspirate,Biopsy, and Clot, right iliac and core - HYPERCELLULAR BONE MARROW FOR AGE WITH PLASMA CELL NEOPLASM - TRILINEAGE HEMATOPOIESIS. - SEE COMMENT. PERIPHERAL BLOOD: - MICROCYTIC-HYPOCHROMIC ANEMIA. Diagnosis Note The bone marrow is hypercellular with trilineage hematopoiesis but with relative abundance of erythroid precursors and increased number of megakaryocytes with nonspecific changes. Significant dyspoiesis is not seen. Iron stores are present with no ring sideroblasts. The plasma cells are increased in number representing 8% of all cells in the aspirate although focal areas in the core biopsy show 10 to 20% as primarily seen by CD138 stain. In situ hybridization for kappa and lambda light chains show kappa light chain restriction consistent with plasma cell neoplasm. Correlation with cytogenetic and FISH studies is recommended. (BNS:ecj/gt 04/07/2017)  IMAGE STUDIES I have personally reviewed below image results.  03/15/2017 DG bone survey Met: Nonspecific lucent lesion in the distal right ulna measuring 15-16 mm, but otherwise normal bone mineralization for age throughout the visible skeleton. A solitary lytic lesion in a distal extremity would be an unusual presentation of multiple myeloma, and I favor the distal right ulna lesion is benign. Recommend correlation with serum and urine protein electrophoresis.  04/14/2017 PET scan: 1. 2.1 cm hypermetabolic soft tissue lesion in the left heel, adjacent to the calcaneal tuberosity. 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 2. Mottled FDG accumulation diffusely in the marrow space without other frankly overt hypermetabolic bony lesion. 3. Coronary artery and thoracoabdominal aortic atherosclerosis with abdominal aortic stent graft visualized in situ. 4. Bilateral renal cysts and bilateral nonobstructing renal  stones.    ASSESSMENT & PLAN:  1. Iron deficiency anemia due to chronic blood loss   2. Multiple myeloma, remission status unspecified [place]   3. CKD (chronic kidney disease) stage 3, GFR 30-59 ml/min (HCC)   4. 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 Patient appears tolerating well without worsening of his pre-existing neuropathy. # Light chain ratio decreased. Ratio decreased from 13.04 to 3.3.   # shortness of breath and fatigue, likely secondary to anemia, Iron panel improved after previous Venofer infusion, TIBC still quite high and ferritin <100. Complete course of additional weekly IV iron. He has finished 2, will give 2 more weekly dose.   # chronic renal insufficiency stage 3,  Kidney function flucturates. Advise patient to continue oral hydration. [facility]  # continue Aspirin 8137mnd Plavix, close monitor platelet coutns.  # Continue acyclovir for prophylaxis.   # Xgeva or zometa once he obtained dental clearance. He has not obtained dental clearance yet.  # Plan repeat PET scan in 4 weeks  Follow up in  5 weeks.  All questions were answered. The patient knows to call the clinic with any problems questions or concerns.  Earlie Server, MD, PhD Hematology Oncology [facility] Pager- [phone] 07/27/2017

## 2017-07-27 ENCOUNTER — Inpatient Hospital Stay: Payer: Medicare Other

## 2017-07-27 ENCOUNTER — Other Ambulatory Visit: Payer: Self-pay

## 2017-07-27 ENCOUNTER — Encounter: Payer: Self-pay | Admitting: Oncology

## 2017-07-27 ENCOUNTER — Inpatient Hospital Stay (HOSPITAL_BASED_OUTPATIENT_CLINIC_OR_DEPARTMENT_OTHER): Payer: Medicare Other | Admitting: Oncology

## 2017-07-27 VITALS — BP 103/55 | HR 63 | Temp 98.1°F | Resp 16 | Ht 71.0 in | Wt 232.7 lb

## 2017-07-27 DIAGNOSIS — C9000 Multiple myeloma not having achieved remission: Secondary | ICD-10-CM

## 2017-07-27 DIAGNOSIS — Z923 Personal history of irradiation: Secondary | ICD-10-CM | POA: Diagnosis not present

## 2017-07-27 DIAGNOSIS — N183 Chronic kidney disease, stage 3 unspecified: Secondary | ICD-10-CM

## 2017-07-27 DIAGNOSIS — M069 Rheumatoid arthritis, unspecified: Secondary | ICD-10-CM | POA: Diagnosis not present

## 2017-07-27 DIAGNOSIS — G629 Polyneuropathy, unspecified: Secondary | ICD-10-CM | POA: Diagnosis not present

## 2017-07-27 DIAGNOSIS — D500 Iron deficiency anemia secondary to blood loss (chronic): Secondary | ICD-10-CM | POA: Diagnosis not present

## 2017-07-27 DIAGNOSIS — Z5111 Encounter for antineoplastic chemotherapy: Secondary | ICD-10-CM

## 2017-07-27 DIAGNOSIS — Z5112 Encounter for antineoplastic immunotherapy: Secondary | ICD-10-CM | POA: Diagnosis not present

## 2017-07-27 LAB — CBC WITH DIFFERENTIAL/PLATELET
Basophils Absolute: 0 10*3/uL (ref 0–0.1)
Basophils Relative: 0 %
EOS ABS: 0.5 10*3/uL (ref 0–0.7)
EOS PCT: 7 %
HEMATOCRIT: 29.1 % — AB (ref 40.0–52.0)
HEMOGLOBIN: 9.1 g/dL — AB (ref 13.0–18.0)
LYMPHS PCT: 17 %
Lymphs Abs: 1.4 10*3/uL (ref 1.0–3.6)
MCH: 25.1 pg — ABNORMAL LOW (ref 26.0–34.0)
MCHC: 31.3 g/dL — ABNORMAL LOW (ref 32.0–36.0)
MCV: 80.2 fL (ref 80.0–100.0)
MONOS PCT: 10 %
Monocytes Absolute: 0.8 10*3/uL (ref 0.2–1.0)
NEUTROS PCT: 66 %
Neutro Abs: 5.3 10*3/uL (ref 1.4–6.5)
Platelets: 146 10*3/uL — ABNORMAL LOW (ref 150–440)
RBC: 3.63 MIL/uL — AB (ref 4.40–5.90)
RDW: 26.8 % — AB (ref 11.5–14.5)
WBC: 8 10*3/uL (ref 3.8–10.6)
nRBC: 1 /100 WBC — ABNORMAL HIGH

## 2017-07-27 LAB — COMPREHENSIVE METABOLIC PANEL
ALBUMIN: 3.3 g/dL — AB (ref 3.5–5.0)
ALK PHOS: 65 U/L (ref 38–126)
ALT: 16 U/L — ABNORMAL LOW (ref 17–63)
ANION GAP: 7 (ref 5–15)
AST: 17 U/L (ref 15–41)
BUN: 25 mg/dL — ABNORMAL HIGH (ref 6–20)
CO2: 20 mmol/L — ABNORMAL LOW (ref 22–32)
CREATININE: 1.45 mg/dL — AB (ref 0.61–1.24)
Calcium: 8.5 mg/dL — ABNORMAL LOW (ref 8.9–10.3)
Chloride: 113 mmol/L — ABNORMAL HIGH (ref 101–111)
GFR, EST AFRICAN AMERICAN: 53 mL/min — AB (ref >60)
GFR, EST NON AFRICAN AMERICAN: 46 mL/min — AB (ref >60)
GLUCOSE: 107 mg/dL — AB (ref 65–99)
Potassium: 4.2 mmol/L (ref 3.5–5.1)
Sodium: 140 mmol/L (ref 135–145)
Total Bilirubin: 0.5 mg/dL (ref 0.3–1.2)
Total Protein: 6.2 g/dL — ABNORMAL LOW (ref 6.5–8.1)

## 2017-07-27 MED ORDER — PROCHLORPERAZINE MALEATE 10 MG PO TABS
10.0000 mg | ORAL_TABLET | Freq: Once | ORAL | Status: AC
  Administered 2017-07-27: 10 mg via ORAL
  Filled 2017-07-27: qty 1

## 2017-07-27 MED ORDER — BORTEZOMIB CHEMO SQ INJECTION 3.5 MG (2.5MG/ML)
1.3000 mg/m2 | Freq: Once | INTRAMUSCULAR | Status: AC
  Administered 2017-07-27: 2.75 mg via SUBCUTANEOUS
  Filled 2017-07-27: qty 1.1

## 2017-07-27 NOTE — Progress Notes (Signed)
Patient here for follow up. Reports feeling cold all the time and short  Of breath.

## 2017-07-29 ENCOUNTER — Other Ambulatory Visit: Payer: Self-pay | Admitting: *Deleted

## 2017-07-29 DIAGNOSIS — C9000 Multiple myeloma not having achieved remission: Secondary | ICD-10-CM

## 2017-08-01 ENCOUNTER — Inpatient Hospital Stay: Payer: Medicare Other

## 2017-08-01 VITALS — BP 104/56 | HR 60 | Temp 96.0°F | Resp 20

## 2017-08-01 DIAGNOSIS — Z5112 Encounter for antineoplastic immunotherapy: Secondary | ICD-10-CM | POA: Diagnosis not present

## 2017-08-01 DIAGNOSIS — D509 Iron deficiency anemia, unspecified: Secondary | ICD-10-CM

## 2017-08-01 MED ORDER — IRON SUCROSE 20 MG/ML IV SOLN
200.0000 mg | Freq: Once | INTRAVENOUS | Status: AC
  Administered 2017-08-01: 200 mg via INTRAVENOUS
  Filled 2017-08-01: qty 10

## 2017-08-01 MED ORDER — SODIUM CHLORIDE 0.9 % IV SOLN
Freq: Once | INTRAVENOUS | Status: AC
  Administered 2017-08-01: 14:00:00 via INTRAVENOUS
  Filled 2017-08-01: qty 1000

## 2017-08-01 MED ORDER — LENALIDOMIDE 10 MG PO CAPS: 10.0000 mg | ORAL_CAPSULE | Freq: Every day | ORAL | 0 refills | Status: DC

## 2017-08-02 ENCOUNTER — Encounter: Payer: Self-pay | Admitting: Oncology

## 2017-08-02 ENCOUNTER — Other Ambulatory Visit: Payer: Self-pay | Admitting: *Deleted

## 2017-08-02 ENCOUNTER — Telehealth: Payer: Self-pay | Admitting: Oncology

## 2017-08-02 ENCOUNTER — Inpatient Hospital Stay: Payer: Medicare Other

## 2017-08-02 ENCOUNTER — Inpatient Hospital Stay (HOSPITAL_BASED_OUTPATIENT_CLINIC_OR_DEPARTMENT_OTHER): Payer: Medicare Other | Admitting: Oncology

## 2017-08-02 ENCOUNTER — Other Ambulatory Visit: Payer: Self-pay | Admitting: Oncology

## 2017-08-02 VITALS — BP 96/61 | HR 68 | Temp 98.2°F | Resp 20

## 2017-08-02 DIAGNOSIS — R42 Dizziness and giddiness: Secondary | ICD-10-CM

## 2017-08-02 DIAGNOSIS — C9000 Multiple myeloma not having achieved remission: Secondary | ICD-10-CM

## 2017-08-02 DIAGNOSIS — N183 Chronic kidney disease, stage 3 unspecified: Secondary | ICD-10-CM

## 2017-08-02 DIAGNOSIS — Z5112 Encounter for antineoplastic immunotherapy: Secondary | ICD-10-CM | POA: Diagnosis not present

## 2017-08-02 DIAGNOSIS — D509 Iron deficiency anemia, unspecified: Secondary | ICD-10-CM | POA: Diagnosis not present

## 2017-08-02 DIAGNOSIS — M069 Rheumatoid arthritis, unspecified: Secondary | ICD-10-CM

## 2017-08-02 LAB — CBC WITH DIFFERENTIAL/PLATELET
Basophils Absolute: 0.1 10*3/uL (ref 0–0.1)
Basophils Relative: 1 %
Eosinophils Absolute: 0.3 10*3/uL (ref 0–0.7)
Eosinophils Relative: 4 %
HEMATOCRIT: 30.5 % — AB (ref 40.0–52.0)
HEMOGLOBIN: 9.3 g/dL — AB (ref 13.0–18.0)
LYMPHS ABS: 1.1 10*3/uL (ref 1.0–3.6)
Lymphocytes Relative: 14 %
MCH: 25 pg — AB (ref 26.0–34.0)
MCHC: 30.5 g/dL — AB (ref 32.0–36.0)
MCV: 82.1 fL (ref 80.0–100.0)
MONOS PCT: 13 %
Monocytes Absolute: 1 10*3/uL (ref 0.2–1.0)
NEUTROS ABS: 5.2 10*3/uL (ref 1.4–6.5)
NEUTROS PCT: 68 %
Platelets: 138 10*3/uL — ABNORMAL LOW (ref 150–440)
RBC: 3.71 MIL/uL — ABNORMAL LOW (ref 4.40–5.90)
RDW: 27.3 % — ABNORMAL HIGH (ref 11.5–14.5)
WBC: 7.7 10*3/uL (ref 3.8–10.6)

## 2017-08-02 LAB — COMPREHENSIVE METABOLIC PANEL
ALBUMIN: 3.6 g/dL (ref 3.5–5.0)
ALT: 19 U/L (ref 17–63)
ANION GAP: 7 (ref 5–15)
AST: 17 U/L (ref 15–41)
Alkaline Phosphatase: 80 U/L (ref 38–126)
BUN: 40 mg/dL — ABNORMAL HIGH (ref 6–20)
CHLORIDE: 111 mmol/L (ref 101–111)
CO2: 23 mmol/L (ref 22–32)
CREATININE: 1.62 mg/dL — AB (ref 0.61–1.24)
Calcium: 8.4 mg/dL — ABNORMAL LOW (ref 8.9–10.3)
GFR calc non Af Amer: 40 mL/min — ABNORMAL LOW (ref >60)
GFR, EST AFRICAN AMERICAN: 46 mL/min — AB (ref >60)
Glucose, Bld: 102 mg/dL — ABNORMAL HIGH (ref 65–99)
Potassium: 3.7 mmol/L (ref 3.5–5.1)
SODIUM: 141 mmol/L (ref 135–145)
Total Bilirubin: 0.4 mg/dL (ref 0.3–1.2)
Total Protein: 6.9 g/dL (ref 6.5–8.1)

## 2017-08-02 NOTE — Progress Notes (Signed)
Hematology/Oncology Follow Up Note [facility] Telephone:([phone]) [phone] Fax:(336) [phone]  Patient Care Team: Jodi Marble, MD as PCP - General (Internal Medicine) Bary Castilla, Forest Gleason, MD (General Surgery) Edrick Kins, MD as Rounding Team (Internal Medicine) Hillary Bow, MD as Consulting Physician (Internal Medicine) [name], MD as Surgeon (Gastroenterology) Isaias Cowman, MD as Consulting Physician (Cardiology)  REFERRING PROVIDER: Dr.Lateef, [place] CHIEF COMPLAINTS/REASON FOR VISIT  Abnormal UPEP results, follow up for evaluation of monoclonal gammopathy  HISTORY OF PRESENTING ILLNESS:  77 y.o.  male with PMH listed below who presents to follow up on the evaluation and management of his abnormal urine protein electrophoresis results. I reviewed the records from [facility], [place] and [place] was performed and HemOnc related medical problems are listed below.  He lives with a friend. He has 3 adult children.   1 Chronic Kidney disease, Stage III: patient follows up with Dr.Lateef.  2 Proteinuria:  02/18/2017: Albumin/Creatinine ratio was 7, Urine protein electrophoresis,Random Urine revealed M Spike 43.2%, total protein of 43.21m/dl,  01/05/2017 Albumin/Creatinine ratio was 455.2, Albumin 996.5, , 2.15.2018 Albumin/Creatinine ratio was 80.3, Albumin 73.3, Urine protein electrophoresis,Random Urine revealed M Spike 29.7%, total protein of 29.175mdl, 06/30/2016 Serum protein electrophoresis did not detect M spike.  Autoimmune disorder work up showed Negative anti dsDNA, RNP antibodies, smith antibody, Sjogren antibodies, anti Jo-1, positive antichromotin antibodies, positive ANA,  3 Anemia of chronic kidney disease:  4 Iron deficiency anemia: history of iron deficiency, ferritin was 7 on 08/11/2016. He had EGD and colonoscopy that were done this year which showed  duodenitis/esphagitis/diverticulosis/benigh polyps. 5 Rheumatoid arthritis: he follows up with Dr.Kernodle and is on chronic MTX. He Is currently off MTX reports joint pains are controlled, not quite symptomatic.   Patient denies any persistent bone pain or any pain. He continue to feel lack of energy, persistent, not improved with resting or taking naps. He also has lost 25 pounds in the past year which was unintentional.   # Rheumatoid arthritis and currently is off MTX.  # It is ambiguous whether he has truly symptomatic multiple myeloma or a smoldering myeloma, given that anemia can be secondary to iron deficiency and CKD.  The hypermetabolic 2.1 cm hypermetabolic soft tissue lesion in the left heel, can reflect a Plasmacytoma,which should have been biopsied. However patient initially denies being on any blood thinner, later he was found to be on Plavix with pre biopsy questionnaire screening, and biopsy was held. Patient has to contact cardiologist to hold plavix for seven days before procedure. He feels frustrated about multiple workup and meanwhile he becomes more fatigues, with worsening of kidney function. Patient was reluctant about proceeding additional invasive procedures and wants to start treatment. Decision was made to start active MM treatment.   # [facility] bone marrow transplant. He is not considered to  good candidate for transplant. Patient cardiologist Dr. KaChancy Milroyad started patient on Lasix 20 mg daily. [facility] as patient was having dizziness and borderline blood pressure during his clinic visit there.  Current Treatment:  S/p RVD x 4   INTERVAL HISTORY Patient presents to cancer center complaining feeling dizziness. He was added on to been seen and evaluated.  He has been evaluated by GI and s/p capsule study. He has not been notified about his results. He takes Lasix 2074mdaily for lower extremity swelling which has  improved. But has been feeling dizzy lately. No nausea, vomiting. Still has chronic SOB.  Denies any worsening of his pre-existing neuropathy.  .    Review of Systems  Constitutional: Negative for appetite change, fatigue and fever.  HENT:   Negative for hearing loss, lump/mass, mouth sores, sore throat and tinnitus.   Eyes: Negative for eye problems.  Respiratory: Negative for chest tightness, cough, hemoptysis and shortness of breath.        SOB with exertion..  Cardiovascular: Negative for chest pain and leg swelling.  Gastrointestinal: Negative for abdominal distention, abdominal pain, constipation and diarrhea.  Endocrine: Negative for hot flashes.  Genitourinary: Negative for bladder incontinence, difficulty urinating, dyspareunia, dysuria and frequency.   Musculoskeletal: Negative for arthralgias, back pain, flank pain and gait problem.  Skin: Negative for itching and rash.  Neurological: Negative for dizziness, gait problem, headaches and seizures.  Hematological: Negative for adenopathy. Does not bruise/bleed easily.  Psychiatric/Behavioral: Negative for confusion and depression. The patient is not nervous/anxious.      MEDICAL HISTORY:  Past Medical History:  Diagnosis Date  . Anxiety   . Arthritis    rheumatoid  . Atrial fibrillation (HCC)    hx of  . Cancer ([place])   . Chronic kidney disease   . Colon polyp 07-07-15   TUBULAR ADENOMA WITH AT LEAST HIGH-GRADE / Dr Rayann Heman  . Diabetes mellitus without complication ([place])   . Dysrhythmia    bradycardia...Marland KitchenMarland Kitchen2 to 1 heart block  . GERD (gastroesophageal reflux disease)   . Heart murmur    patient unaware of history of murmur  . Hypercholesterolemia   . Hypertension   . Presence of permanent cardiac pacemaker   . Prostate cancer ([place]) 01/01/13, 01/30/14   Gleason 3+4=7, volume 46.6 cc  . Rheumatoid arthritis ([place])   . S/P radiation therapy  04/03/2014 through 06/04/2014                                                       Prostate 7800 cGy in 40 sessions                          . Sleep apnea    uses cpap but it is not in the hospital with him    SURGICAL HISTORY: Past Surgical History:  Procedure Laterality Date  . ABDOMINAL AORTIC ANEURYSM REPAIR  11/2013  . COLON SURGERY  March 2017   Right hemicolectomy for tubulovillous adenoma with high-grade dysplasia.  . COLONOSCOPY WITH PROPOFOL N/A 07/07/2015   Procedure: COLONOSCOPY WITH PROPOFOL;  Surgeon: Josefine Class, MD;  Location: [facility] ENDOSCOPY;  Service: Endoscopy;  Laterality: N/A;  . COLONOSCOPY WITH PROPOFOL N/A 10/28/2016   Procedure: COLONOSCOPY WITH PROPOFOL;  Surgeon: [name], MD;  Location: [facility] ENDOSCOPY;  Service: Endoscopy;  Laterality: N/A;  . ESOPHAGOGASTRODUODENOSCOPY (EGD) WITH PROPOFOL N/A 10/28/2016   Procedure: ESOPHAGOGASTRODUODENOSCOPY (EGD) WITH PROPOFOL;  Surgeon: [name], MD;  Location: [facility] ENDOSCOPY;  Service: Endoscopy;  Laterality: N/A;  . GIVENS CAPSULE STUDY N/A 07/25/2017   Procedure: GIVENS CAPSULE STUDY;  Surgeon: [name], MD;  Location: [facility] ENDOSCOPY;  Service: Gastroenterology;  Laterality: N/A;  . LAPAROSCOPIC RIGHT COLECTOMY Right 08/08/2015   Procedure: LAPAROSCOPIC RIGHT COLECTOMY;  Surgeon: Robert Bellow, MD;  Location: [facility] ORS;  Service: General;  Laterality:  Right;  Marland Kitchen NASAL SINUS SURGERY    . PACEMAKER INSERTION Left 08/26/2016   Procedure: INSERTION PACEMAKER;  Surgeon: Isaias Cowman, MD;  Location: [facility] ORS;  Service: Cardiovascular;  Laterality: Left;  . PROSTATE BIOPSY  01/01/13, 01/30/14   Gleason 3+3=6, vol 46.6 cc  . TONSILLECTOMY    . uvula surgery     for sleep apnea    SOCIAL HISTORY: Social History   Socioeconomic History  . Marital status: Divorced    Spouse name: Not on file  . Number of children: Not on file  . Years of education: Not on file  . Highest education level: Not on file  Social Needs  . Financial resource strain:  Not on file  . Food insecurity - worry: Not on file  . Food insecurity - inability: Not on file  . Transportation needs - medical: Not on file  . Transportation needs - non-medical: Not on file  Occupational History  . Not on file  Tobacco Use  . Smoking status: Current Every Day Smoker    Packs/day: 0.50    Years: 50.00    Pack years: 25.00    Types: Cigarettes  . Smokeless tobacco: Never Used  . Tobacco comment: DOWN TO 1/2 PPD  Substance and Sexual Activity  . Alcohol use: Yes    Alcohol/week: 0.6 oz    Types: 1 Cans of beer per week    Comment: occasionally  . Drug use: No  . Sexual activity: Not Currently  Other Topics Concern  . Not on file  Social History Narrative  . Not on file    FAMILY HISTORY: Family History  Problem Relation Age of Onset  . Heart attack Mother   . Cirrhosis Father   . Pancreatic cancer Brother   . Diabetes Brother   . Diabetes Daughter        medication induced for cancer treatments  . Cancer Daughter        breast, brain    ALLERGIES:  has No Known Allergies.  MEDICATIONS:  Current Outpatient Medications  Medication Sig Dispense Refill  . acetaminophen (TYLENOL) 325 MG tablet Take 325 mg by mouth every 6 (six) hours as needed.    Marland Kitchen amLODipine-benazepril (LOTREL) 5-20 MG capsule Take 1 capsule by mouth daily.    Marland Kitchen aspirin 81 MG chewable tablet Chew 81 mg by mouth daily.    Marland Kitchen atorvastatin (LIPITOR) 80 MG tablet Take 80 mg by mouth at bedtime.     . Baclofen 5 MG TABS Take 5 mg by mouth 3 (three) times daily as needed (hiccups). 21 tablet 0  . fluticasone (FLONASE) 50 MCG/ACT nasal spray Place 2 sprays into both nostrils daily as needed for rhinitis.     . Fluticasone Furoate-Vilanterol (BREO ELLIPTA) 100-25 MCG/INH AEPB Inhale 1 puff into the lungs daily.     . furosemide (LASIX) 20 MG tablet Take 20 mg by mouth daily.    . isosorbide mononitrate (IMDUR) 30 MG 24 hr tablet Take 30 mg by mouth daily.    Marland Kitchen loperamide (IMODIUM) 2 MG  capsule Take 1 capsule (2 mg total) by mouth See admin instructions. With onset of diarrhea, take 20m followed by 270mevery 2 hours resolved. Maximum: 16 mg/day 120 capsule 0  . metFORMIN (GLUCOPHAGE) 500 MG tablet Take 500 mg by mouth 2 (two) times daily with a meal.    . metoprolol succinate (TOPROL-XL) 50 MG 24 hr tablet Take 50 mg by mouth daily. Take with or immediately following  a meal.    . ondansetron (ZOFRAN) 8 MG tablet Take 1 tablet (8 mg total) 2 (two) times daily as needed by mouth (Nausea or vomiting). 30 tablet 1  . pantoprazole (PROTONIX) 40 MG tablet Take 1 tablet (40 mg total) by mouth 2 (two) times daily. 60 tablet 3  . triamcinolone cream (KENALOG) 0.1 % Apply 1 application topically 2 (two) times daily.    . clopidogrel (PLAVIX) 75 MG tablet Take 75 mg by mouth daily.    Marland Kitchen DEXAMETHASONE PO Take 20 mg by mouth See admin instructions.  3  . fexofenadine (ALLEGRA) 180 MG tablet Take 180 mg by mouth daily.     No current facility-administered medications for this visit.       Marland Kitchen  PHYSICAL EXAMINATION: ECOG PERFORMANCE STATUS: 1 - Symptomatic but completely ambulatory Vitals:   08/02/17 1454  BP: 96/61  Pulse: 68  Resp: 20  Temp: 98.2 F (36.8 C)   There were no vitals filed for this visit. Physical Exam  Constitutional: He is oriented to person, place, and time and well-developed, well-nourished, and in no distress. No distress.  HENT:  Head: Normocephalic.  Mouth/Throat: Oropharynx is clear and moist. No oropharyngeal exudate.  Eyes: EOM are normal. Pupils are equal, round, and reactive to light. Right eye exhibits no discharge. No scleral icterus.  Neck: Normal range of motion. Neck supple.  Cardiovascular: Normal rate, regular rhythm and normal heart sounds. Exam reveals no friction rub.  No murmur heard. Pulmonary/Chest: Effort normal and breath sounds normal. No respiratory distress. He has no rales.  Abdominal: Soft. Bowel sounds are normal. He exhibits no  distension. There is no rebound.  Musculoskeletal: Normal range of motion. He exhibits no edema.  Lymphadenopathy:    He has no cervical adenopathy.  Neurological: He is alert and oriented to person, place, and time. No cranial nerve deficit. Coordination normal.  Skin: Skin is dry. No rash noted. He is not diaphoretic. No erythema.  Psychiatric: Mood, affect and judgment normal.    LABORATORY DATA:  I have reviewed the data as listed Lab Results  Component Value Date   WBC 7.7 08/02/2017   HGB 9.3 (L) 08/02/2017   HCT 30.5 (L) 08/02/2017   MCV 82.1 08/02/2017   PLT 138 (L) 08/02/2017   Recent Labs    07/13/17 1320 07/27/17 1024 08/02/17 1400  NA 140 140 141  K 4.5 4.2 3.7  CL 109 113* 111  CO2 24 20* 23  GLUCOSE 136* 107* 102*  BUN 29* 25* 40*  CREATININE 1.38* 1.45* 1.62*  CALCIUM 8.4* 8.5* 8.4*  GFRNONAA 48* 46* 40*  GFRAA 56* 53* 46*  PROT 6.3* 6.2* 6.9  ALBUMIN 3.2* 3.3* 3.6  AST '18 17 17  ' ALT 23 16* 19  ALKPHOS 68 65 80  BILITOT 0.4 0.5 0.4    SPEP: no M spike Serum free light chain Kappa/Lamda ratio 13.04 UPEP  Free light chain Kappa/lamda ratio 162.43, M spike 127m/24 hour   Bone marrow biopsy 03/21/2017  Bone Marrow, Aspirate,Biopsy, and Clot, left iliac - HYPERCELLULAR BONE MARROW FOR AGE WITH TRILINEAGE HEMATOPOIESIS. - PLASMACYTOSIS (PLASMA CELLS 8%)  Karyotype: loss of Y chromosome Cytogenetic MDS FISH Panel negative.   Bone marrow 04/06/2017  Bone Marrow, Aspirate,Biopsy, and Clot, right iliac and core - HYPERCELLULAR BONE MARROW FOR AGE WITH PLASMA CELL NEOPLASM - TRILINEAGE HEMATOPOIESIS. - SEE COMMENT. PERIPHERAL BLOOD: - MICROCYTIC-HYPOCHROMIC ANEMIA. Diagnosis Note The bone marrow is hypercellular with trilineage hematopoiesis but with relative abundance  of erythroid precursors and increased number of megakaryocytes with nonspecific changes. Significant dyspoiesis is not seen. Iron stores are present with no ring sideroblasts. The  plasma cells are increased in number representing 8% of all cells in the aspirate although focal areas in the core biopsy show 10 to 20% as primarily seen by CD138 stain. In situ hybridization for kappa and lambda light chains show kappa light chain restriction consistent with plasma cell neoplasm. Correlation with cytogenetic and FISH studies is recommended. (BNS:ecj/gt 04/07/2017)  IMAGE STUDIES I have personally reviewed below image results.  03/15/2017 DG bone survey Met: Nonspecific lucent lesion in the distal right ulna measuring 15-16 mm, but otherwise normal bone mineralization for age throughout the visible skeleton. A solitary lytic lesion in a distal extremity would be an unusual presentation of multiple myeloma, and I favor the distal right ulna lesion is benign. Recommend correlation with serum and urine protein electrophoresis.  04/14/2017 PET scan: 1. 2.1 cm hypermetabolic soft tissue lesion in the left heel, adjacent to the calcaneal tuberosity. 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 2. Mottled FDG accumulation diffusely in the marrow space without other frankly overt hypermetabolic bony lesion. 3. Coronary artery and thoracoabdominal aortic atherosclerosis with abdominal aortic stent graft visualized in situ. 4. Bilateral renal cysts and bilateral nonobstructing renal stones.    ASSESSMENT & PLAN:  1. Multiple myeloma, remission status unspecified ([place])   2. Iron deficiency anemia, unspecified iron deficiency anemia type   3. CKD (chronic kidney disease) stage 3, GFR 30-59 ml/min (HCC)   4. 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  Revlimid 10 mg PO once per day on days 1 to 14 (dose reduced due to GFR) Revlimid renal dosing of 76m,  Velcade 1.3 mg/m2 IV once per day on days 1,  8, 15 Dexamethasone 20 mg PO once per day on days 1, 8, 15 given patient's diabetes  # Dizziness, likely due to over diuresis. I  suggest patient to hold lasix for a few days and restart Lasix if he sees   # shortness of breath and fatigue, likely secondary to anemia, s/p additional IV Venofer. Anemia improved.  # chronic renal insufficiency stage 3,  Kidney function flucturates. Advise patient to continue oral hydration. [facility]  # continue Aspirin 838mand Plavix, close monitor platelet coutns.  # Stop acyclovir  # Xgeva or zometa once he obtained dental clearance. He has not obtained dental clearance yet.  # Plan repeat PET scan in 4 weeks  Follow up in 5 weeks.  All questions were answered. The patient knows to call the clinic with any problems questions or concerns.  ZhEarlie ServerMD, PhD Hematology Oncology [facility]- [phone]/26/2019

## 2017-08-02 NOTE — Progress Notes (Signed)
Pt. Feels off for him. Weak and dizzy at times. I took vs and he did not have a change from standing to sitting.he did not have any dizziness when standing. Sometimes he does feel this way.

## 2017-08-02 NOTE — Telephone Encounter (Signed)
Oral Oncology Patient Advocate Encounter  Almyra Free came in office and asked if we could call to have medication shipment stopped.  Called [facility] and asked to not ship Revlimid or Dexamethasone. Spoke with Tammy S. Told her we will call or send a new script when needed.    [place] Patient Advocate [phone] 08/02/2017 1:45 PM

## 2017-08-03 ENCOUNTER — Ambulatory Visit: Payer: Medicare Other | Admitting: Oncology

## 2017-08-08 ENCOUNTER — Inpatient Hospital Stay: Payer: Medicare Other | Attending: Oncology

## 2017-08-08 VITALS — BP 100/67 | HR 60 | Temp 97.1°F | Resp 20

## 2017-08-08 DIAGNOSIS — N183 Chronic kidney disease, stage 3 (moderate): Secondary | ICD-10-CM | POA: Insufficient documentation

## 2017-08-08 DIAGNOSIS — D509 Iron deficiency anemia, unspecified: Secondary | ICD-10-CM | POA: Insufficient documentation

## 2017-08-08 MED ORDER — SODIUM CHLORIDE 0.9 % IV SOLN
INTRAVENOUS | Status: DC
  Administered 2017-08-08: 14:00:00 via INTRAVENOUS
  Filled 2017-08-08: qty 1000

## 2017-08-08 MED ORDER — IRON SUCROSE 20 MG/ML IV SOLN
200.0000 mg | Freq: Once | INTRAVENOUS | Status: AC
  Administered 2017-08-08: 200 mg via INTRAVENOUS
  Filled 2017-08-08: qty 10

## 2017-08-09 ENCOUNTER — Telehealth: Payer: Self-pay | Admitting: Gastroenterology

## 2017-08-09 NOTE — Telephone Encounter (Signed)
Returned patient's call.   Advised callback tomorrow with capsule result reading per Dr. Vicente Males.

## 2017-08-09 NOTE — Telephone Encounter (Signed)
Pt is calling for capsule results

## 2017-08-10 ENCOUNTER — Telehealth: Payer: Self-pay

## 2017-08-10 NOTE — Telephone Encounter (Signed)
Advised patient that both bleeding and non-bleeding AVM's were seen on the capsule study. Likely the source of his anemia per Dr. Vicente Males.   Referral sent to [facility] @ Dr. Francella Solian for Deep Enteroscopy.

## 2017-08-11 ENCOUNTER — Encounter: Payer: Self-pay | Admitting: Gastroenterology

## 2017-08-15 ENCOUNTER — Telehealth: Payer: Self-pay | Admitting: Pharmacist

## 2017-08-15 NOTE — Telephone Encounter (Signed)
Oral Chemotherapy Pharmacist Encounter   Refill request for Mr. Statzer's dexamethasone was faxed from [place]. His RVd regimen is on hold. Called [facility] to let them know we were currently holding his dexamethasone.    Darl Pikes, PharmD, BCPS Hematology/Oncology Clinical Pharmacist [facility] [phone]  08/15/2017 4:02 PM

## 2017-08-24 ENCOUNTER — Ambulatory Visit
Admission: RE | Admit: 2017-08-24 | Discharge: 2017-08-24 | Disposition: A | Discharge status: Home / Self Care | Payer: Medicare Other | Source: Ambulatory Visit | Attending: Oncology | Admitting: Oncology

## 2017-08-24 DIAGNOSIS — C9000 Multiple myeloma not having achieved remission: Secondary | ICD-10-CM

## 2017-08-24 DIAGNOSIS — J439 Emphysema, unspecified: Secondary | ICD-10-CM | POA: Diagnosis not present

## 2017-08-24 DIAGNOSIS — N281 Cyst of kidney, acquired: Secondary | ICD-10-CM | POA: Diagnosis not present

## 2017-08-24 DIAGNOSIS — K5730 Diverticulosis of large intestine without perforation or abscess without bleeding: Secondary | ICD-10-CM | POA: Insufficient documentation

## 2017-08-24 DIAGNOSIS — K4020 Bilateral inguinal hernia, without obstruction or gangrene, not specified as recurrent: Secondary | ICD-10-CM | POA: Diagnosis not present

## 2017-08-24 DIAGNOSIS — J90 Pleural effusion, not elsewhere classified: Secondary | ICD-10-CM | POA: Diagnosis not present

## 2017-08-24 LAB — GLUCOSE, CAPILLARY: GLUCOSE-CAPILLARY: 101 mg/dL — AB (ref 65–99)

## 2017-08-24 MED ORDER — FLUDEOXYGLUCOSE F - 18 (FDG) INJECTION
11.4500 | Freq: Once | INTRAVENOUS | Status: AC | PRN
  Administered 2017-08-24: 11.45 via INTRAVENOUS

## 2017-08-26 ENCOUNTER — Inpatient Hospital Stay: Payer: Medicare Other

## 2017-08-26 DIAGNOSIS — Z5111 Encounter for antineoplastic chemotherapy: Secondary | ICD-10-CM

## 2017-08-26 DIAGNOSIS — C9000 Multiple myeloma not having achieved remission: Secondary | ICD-10-CM

## 2017-08-26 DIAGNOSIS — D509 Iron deficiency anemia, unspecified: Secondary | ICD-10-CM | POA: Diagnosis not present

## 2017-08-26 DIAGNOSIS — D500 Iron deficiency anemia secondary to blood loss (chronic): Secondary | ICD-10-CM

## 2017-08-26 DIAGNOSIS — N183 Chronic kidney disease, stage 3 unspecified: Secondary | ICD-10-CM

## 2017-08-26 LAB — COMPREHENSIVE METABOLIC PANEL
ALT: 12 U/L — ABNORMAL LOW (ref 17–63)
ANION GAP: 8 (ref 5–15)
AST: 13 U/L — ABNORMAL LOW (ref 15–41)
Albumin: 3.7 g/dL (ref 3.5–5.0)
Alkaline Phosphatase: 86 U/L (ref 38–126)
BUN: 13 mg/dL (ref 6–20)
CALCIUM: 8.9 mg/dL (ref 8.9–10.3)
CHLORIDE: 109 mmol/L (ref 101–111)
CO2: 21 mmol/L — AB (ref 22–32)
Creatinine, Ser: 1.14 mg/dL (ref 0.61–1.24)
GFR calc non Af Amer: >60 mL/min (ref >60)
Glucose, Bld: 105 mg/dL — ABNORMAL HIGH (ref 65–99)
Potassium: 4 mmol/L (ref 3.5–5.1)
SODIUM: 138 mmol/L (ref 135–145)
Total Bilirubin: 0.9 mg/dL (ref 0.3–1.2)
Total Protein: 7.3 g/dL (ref 6.5–8.1)

## 2017-08-26 LAB — IRON AND TIBC
IRON: 11 ug/dL — AB (ref 45–182)
Saturation Ratios: 3 % — ABNORMAL LOW (ref 17.9–39.5)
TIBC: 384 ug/dL (ref 250–450)
UIBC: 373 ug/dL

## 2017-08-26 LAB — FERRITIN: Ferritin: 54 ng/mL (ref 24–336)

## 2017-08-29 LAB — PROTEIN ELECTROPHORESIS, SERUM
A/G Ratio: 1.1 (ref 0.7–1.7)
ALBUMIN ELP: 3.4 g/dL (ref 2.9–4.4)
ALPHA-1-GLOBULIN: 0.3 g/dL (ref 0.0–0.4)
Alpha-2-Globulin: 0.8 g/dL (ref 0.4–1.0)
BETA GLOBULIN: 1 g/dL (ref 0.7–1.3)
GAMMA GLOBULIN: 1.1 g/dL (ref 0.4–1.8)
Globulin, Total: 3.2 g/dL (ref 2.2–3.9)
Total Protein ELP: 6.6 g/dL (ref 6.0–8.5)

## 2017-08-29 LAB — KAPPA/LAMBDA LIGHT CHAINS
Kappa free light chain: 122.5 mg/L — ABNORMAL HIGH (ref 3.3–19.4)
Kappa, lambda light chain ratio: 5.08 — ABNORMAL HIGH (ref 0.26–1.65)
Lambda free light chains: 24.1 mg/L (ref 5.7–26.3)

## 2017-08-31 DIAGNOSIS — K219 Gastro-esophageal reflux disease without esophagitis: Secondary | ICD-10-CM | POA: Insufficient documentation

## 2017-08-31 DIAGNOSIS — I509 Heart failure, unspecified: Secondary | ICD-10-CM | POA: Insufficient documentation

## 2017-09-04 NOTE — Progress Notes (Signed)
Hematology/Oncology Follow Up Note [facility] [phone] [phone]  Patient Care Team: Jodi Marble, MD as PCP - General (Internal Medicine) Bary Castilla, Forest Gleason, MD (General Surgery) Edrick Kins, MD as Rounding Team (Internal Medicine) Hillary Bow, MD as Consulting Physician (Internal Medicine) [name], MD as Surgeon (Gastroenterology) Isaias Cowman, MD as Consulting Physician (Cardiology)  REFERRING PROVIDER: Dr.Lateef, [place] CHIEF COMPLAINTS/REASON FOR VISIT  Abnormal UPEP results, follow up for evaluation of monoclonal gammopathy  HISTORY OF PRESENTING ILLNESS:  77 y.o.  male with PMH listed below who presents to follow up on the evaluation and management of his abnormal urine protein electrophoresis results. I reviewed the records from [facility], [place] and [place] was performed and HemOnc related medical problems are listed below.  He lives with a friend. He has 3 adult children.   1 Chronic Kidney disease, Stage III: patient follows up with Dr.Lateef.  2 Proteinuria:  02/18/2017: Albumin/Creatinine ratio was 7, Urine protein electrophoresis,Random Urine revealed M Spike 43.2%, total protein of 43.40m/dl,  01/05/2017 Albumin/Creatinine ratio was 455.2, Albumin 996.5, , 2.15.2018 Albumin/Creatinine ratio was 80.3, Albumin 73.3, Urine protein electrophoresis,Random Urine revealed M Spike 29.7%, total protein of 29.150mdl, 06/30/2016 Serum protein electrophoresis did not detect M spike.  Autoimmune disorder work up showed Negative anti dsDNA, RNP antibodies, smith antibody, Sjogren antibodies, anti Jo-1, positive antichromotin antibodies, positive ANA,  3 Anemia of chronic kidney disease:  4 Iron deficiency anemia: history of iron deficiency, ferritin was 7 on 08/11/2016. He had EGD and colonoscopy that were done this year which showed  duodenitis/esphagitis/diverticulosis/benigh polyps. 5 Rheumatoid arthritis: he follows up with Dr.Kernodle and is on chronic MTX. He Is currently off MTX reports joint pains are controlled, not quite symptomatic.   Patient denies any persistent bone pain or any pain. He continue to feel lack of energy, persistent, not improved with resting or taking naps. He also has lost 25 pounds in the past year which was unintentional.   # Rheumatoid arthritis and currently is off MTX.  # It is ambiguous whether he has truly symptomatic multiple myeloma or a smoldering myeloma, given that anemia can be secondary to iron deficiency and CKD.  The hypermetabolic 2.1 cm hypermetabolic soft tissue lesion in the left heel, can reflect a Plasmacytoma,which should have been biopsied. However patient initially denies being on any blood thinner, later he was found to be on Plavix with pre biopsy questionnaire screening, and biopsy was held. Patient has to contact cardiologist to hold plavix for seven days before procedure. He feels frustrated about multiple workup and meanwhile he becomes more fatigues, with worsening of kidney function. Patient was reluctant about proceeding additional invasive procedures and wants to start treatment. Decision was made to start active MM treatment.   # [facility] autologous bone marrow transplant. He is not considered to  good candidate for transplant. Patient cardiologist Dr. KaChancy Milroyad started patient on Lasix 20 mg daily. [facility] as patient was having dizziness and borderline blood pressure during his clinic visit there.  Current Treatment:  S/p RVD x 4 cycles tolerates well except cytopenia.  Revlimid 10 mg PO once per day on days 1 to 14 (dose reduced due to GFR) Revlimid renal dosing of 1047m Velcade 1.3 mg/m2 IV once per day on days 1,  8, 15 Dexamethasone 20 mg PO once per day on days 1,  8, 15 given patient's diabetes  INTERVAL HISTORY Patient presents for follow-up of management of multiple myeloma.  Patient has had PET scan done and is here to discuss results and future management plan.  He reports having chronic shortness of breath.  Fatigue level is at baseline.  No nausea vomiting.  Not having episodes lately.    Review of Systems  Constitutional: Negative for appetite change, fatigue, fever and unexpected weight change.  HENT:   Negative for hearing loss, lump/mass, mouth sores, sore throat and tinnitus.   Eyes: Negative for eye problems.  Respiratory: Negative for chest tightness, cough, hemoptysis and shortness of breath.        SOB with exertion..  Cardiovascular: Negative for chest pain and leg swelling.  Gastrointestinal: Negative for abdominal distention, abdominal pain, constipation and diarrhea.  Endocrine: Negative for hot flashes.  Genitourinary: Negative for bladder incontinence, difficulty urinating, dysuria and frequency.   Musculoskeletal: Negative for arthralgias, back pain, flank pain and gait problem.  Skin: Negative for itching and rash.  Neurological: Negative for dizziness, gait problem, headaches and seizures.  Hematological: Negative for adenopathy. Does not bruise/bleed easily.  Psychiatric/Behavioral: Negative for confusion and depression. The patient is not nervous/anxious.      MEDICAL HISTORY:  Past Medical History:  Diagnosis Date  . Anxiety   . Arthritis    rheumatoid  . Atrial fibrillation (HCC)    hx of  . Cancer ([place])   . Chronic kidney disease   . Colon polyp 07-07-15   TUBULAR ADENOMA WITH AT LEAST HIGH-GRADE / Dr Rayann Heman  . Diabetes mellitus without complication ([place])   . Dysrhythmia    bradycardia...Marland KitchenMarland Kitchen2 to 1 heart block  . GERD (gastroesophageal reflux disease)   . Heart murmur    patient unaware of history of murmur  . Hypercholesterolemia   . Hypertension   . Presence of permanent cardiac pacemaker   . Prostate cancer  ([place]) 01/01/13, 01/30/14   Gleason 3+4=7, volume 46.6 cc  . Rheumatoid arthritis ([place])   . S/P radiation therapy  04/03/2014 through 06/04/2014                                                      Prostate 7800 cGy in 40 sessions                          . Sleep apnea    uses cpap but it is not in the hospital with him    SURGICAL HISTORY: Past Surgical History:  Procedure Laterality Date  . ABDOMINAL AORTIC ANEURYSM REPAIR  11/2013  . COLON SURGERY  March 2017   Right hemicolectomy for tubulovillous adenoma with high-grade dysplasia.  . COLONOSCOPY WITH PROPOFOL N/A 07/07/2015   Procedure: COLONOSCOPY WITH PROPOFOL;  Surgeon: Josefine Class, MD;  Location: [facility] ENDOSCOPY;  Service: Endoscopy;  Laterality: N/A;  . COLONOSCOPY WITH PROPOFOL N/A 10/28/2016   Procedure: COLONOSCOPY WITH PROPOFOL;  Surgeon: [name], MD;  Location: [facility] ENDOSCOPY;  Service: Endoscopy;  Laterality: N/A;  . ESOPHAGOGASTRODUODENOSCOPY (EGD) WITH PROPOFOL N/A 10/28/2016   Procedure: ESOPHAGOGASTRODUODENOSCOPY (EGD) WITH PROPOFOL;  Surgeon: [name], MD;  Location: [facility] ENDOSCOPY;  Service: Endoscopy;  Laterality: N/A;  . GIVENS CAPSULE STUDY N/A 07/25/2017   Procedure: GIVENS CAPSULE STUDY;  Surgeon: [name], MD;  Location: [facility]  ENDOSCOPY;  Service: Gastroenterology;  Laterality: N/A;  . LAPAROSCOPIC RIGHT COLECTOMY Right 08/08/2015   Procedure: LAPAROSCOPIC RIGHT COLECTOMY;  Surgeon: Robert Bellow, MD;  Location: [facility] ORS;  Service: General;  Laterality: Right;  . NASAL SINUS SURGERY    . PACEMAKER INSERTION Left 08/26/2016   Procedure: INSERTION PACEMAKER;  Surgeon: Isaias Cowman, MD;  Location: [facility] ORS;  Service: Cardiovascular;  Laterality: Left;  . PROSTATE BIOPSY  01/01/13, 01/30/14   Gleason 3+3=6, vol 46.6 cc  . TONSILLECTOMY    . uvula surgery     for sleep apnea    SOCIAL HISTORY: Social History   Socioeconomic History  . Marital status: Divorced    Spouse name: Not on file  .  Number of children: Not on file  . Years of education: Not on file  . Highest education level: Not on file  Occupational History  . Not on file  Social Needs  . Financial resource strain: Not on file  . Food insecurity:    Worry: Not on file    Inability: Not on file  . Transportation needs:    Medical: Not on file    Non-medical: Not on file  Tobacco Use  . Smoking status: Current Every Day Smoker    Packs/day: 0.50    Years: 50.00    Pack years: 25.00    Types: Cigarettes  . Smokeless tobacco: Never Used  . Tobacco comment: DOWN TO 1/2 PPD  Substance and Sexual Activity  . Alcohol use: Yes    Alcohol/week: 0.6 oz    Types: 1 Cans of beer per week    Comment: occasionally  . Drug use: No  . Sexual activity: Not Currently  Lifestyle  . Physical activity:    Days per week: Not on file    Minutes per session: Not on file  . Stress: Not on file  Relationships  . Social connections:    Talks on phone: Not on file    Gets together: Not on file    Attends religious service: Not on file    Active member of club or organization: Not on file    Attends meetings of clubs or organizations: Not on file    Relationship status: Not on file  . Intimate partner violence:    Fear of current or ex partner: Not on file    Emotionally abused: Not on file    Physically abused: Not on file    Forced sexual activity: Not on file  Other Topics Concern  . Not on file  Social History Narrative  . Not on file    FAMILY HISTORY: Family History  Problem Relation Age of Onset  . Heart attack Mother   . Cirrhosis Father   . Pancreatic cancer Brother   . Diabetes Brother   . Diabetes Daughter        medication induced for cancer treatments  . Cancer Daughter        breast, brain    ALLERGIES:  has No Known Allergies.  MEDICATIONS:  Current Outpatient Medications  Medication Sig Dispense Refill  . acetaminophen (TYLENOL) 325 MG tablet Take 325 mg by mouth every 6 (six) hours as  needed.    Marland Kitchen amLODipine-benazepril (LOTREL) 5-20 MG capsule Take 1 capsule by mouth daily.    Marland Kitchen aspirin 81 MG chewable tablet Chew 81 mg by mouth daily.    Marland Kitchen atorvastatin (LIPITOR) 80 MG tablet Take 80 mg by mouth at bedtime.     . Baclofen  5 MG TABS Take 5 mg by mouth 3 (three) times daily as needed (hiccups). 21 tablet 0  . clopidogrel (PLAVIX) 75 MG tablet Take 75 mg by mouth daily.    Marland Kitchen DEXAMETHASONE PO Take 20 mg by mouth See admin instructions.  3  . fexofenadine (ALLEGRA) 180 MG tablet Take 180 mg by mouth daily.    . fluticasone (FLONASE) 50 MCG/ACT nasal spray Place 2 sprays into both nostrils daily as needed for rhinitis.     . Fluticasone Furoate-Vilanterol (BREO ELLIPTA) 100-25 MCG/INH AEPB Inhale 1 puff into the lungs daily.     . furosemide (LASIX) 20 MG tablet Take 20 mg by mouth daily.    . isosorbide mononitrate (IMDUR) 30 MG 24 hr tablet Take 30 mg by mouth daily.    Marland Kitchen loperamide (IMODIUM) 2 MG capsule Take 1 capsule (2 mg total) by mouth See admin instructions. With onset of diarrhea, take 40m followed by 252mevery 2 hours resolved. Maximum: 16 mg/day 120 capsule 0  . metFORMIN (GLUCOPHAGE) 500 MG tablet Take 500 mg by mouth 2 (two) times daily with a meal.    . metoprolol succinate (TOPROL-XL) 50 MG 24 hr tablet Take 50 mg by mouth daily. Take with or immediately following a meal.    . ondansetron (ZOFRAN) 8 MG tablet Take 1 tablet (8 mg total) 2 (two) times daily as needed by mouth (Nausea or vomiting). 30 tablet 1  . pantoprazole (PROTONIX) 40 MG tablet Take 1 tablet (40 mg total) by mouth 2 (two) times daily. 60 tablet 3  . triamcinolone cream (KENALOG) 0.1 % Apply 1 application topically 2 (two) times daily.     No current facility-administered medications for this visit.       . Marland KitchenPHYSICAL EXAMINATION: ECOG PERFORMANCE STATUS: 1 - Symptomatic but completely ambulatory Vitals:   09/05/17 1015  BP: 107/72  Pulse: 93  Temp: (!) 96.8 F (36 C)   Filed Weights    09/05/17 1015  Weight: 208 lb 11.2 oz (94.7 kg)   Physical Exam  Constitutional: He is oriented to person, place, and time and well-developed, well-nourished, and in no distress. No distress.  HENT:  Head: Normocephalic.  Mouth/Throat: Oropharynx is clear and moist. No oropharyngeal exudate.  Eyes: Pupils are equal, round, and reactive to light. EOM are normal. Right eye exhibits no discharge. No scleral icterus.  Neck: Normal range of motion. Neck supple.  Cardiovascular: Normal rate, regular rhythm and normal heart sounds. Exam reveals no friction rub.  No murmur heard. Pulmonary/Chest: Effort normal and breath sounds normal. No respiratory distress. He has no rales.  Abdominal: Soft. Bowel sounds are normal. He exhibits no distension. There is no rebound.  Musculoskeletal: Normal range of motion. He exhibits no edema.  Lymphadenopathy:    He has no cervical adenopathy.  Neurological: He is alert and oriented to person, place, and time. No cranial nerve deficit. Coordination normal.  Skin: Skin is dry. No rash noted. He is not diaphoretic. No erythema.  Psychiatric: Affect and judgment normal.    LABORATORY DATA:  I have reviewed the data as listed Lab Results  Component Value Date   WBC 7.7 08/02/2017   HGB 9.3 (L) 08/02/2017   HCT 30.5 (L) 08/02/2017   MCV 82.1 08/02/2017   PLT 138 (L) 08/02/2017   Recent Labs    07/27/17 1024 08/02/17 1400 08/26/17 1145  NA 140 141 138  K 4.2 3.7 4.0  CL 113* 111 109  CO2 20* 23  21*  GLUCOSE 107* 102* 105*  BUN 25* 40* 13  CREATININE 1.45* 1.62* 1.14  CALCIUM 8.5* 8.4* 8.9  GFRNONAA 46* 40* >60  GFRAA 53* 46* >60  PROT 6.2* 6.9 7.3  ALBUMIN 3.3* 3.6 3.7  AST 17 17 13*  ALT 16* 19 12*  ALKPHOS 65 80 86  BILITOT 0.5 0.4 0.9    SPEP: no M spike Serum free light chain Kappa/Lamda ratio 13.04 UPEP  Free light chain Kappa/lamda ratio 162.43, M spike 155m/24 hour   Bone marrow biopsy 03/21/2017  Bone Marrow,  Aspirate,Biopsy, and Clot, left iliac - HYPERCELLULAR BONE MARROW FOR AGE WITH TRILINEAGE HEMATOPOIESIS. - PLASMACYTOSIS (PLASMA CELLS 8%)  Karyotype: loss of Y chromosome Cytogenetic MDS FISH Panel negative.   Bone marrow 04/06/2017  Bone Marrow, Aspirate,Biopsy, and Clot, right iliac and core - HYPERCELLULAR BONE MARROW FOR AGE WITH PLASMA CELL NEOPLASM - TRILINEAGE HEMATOPOIESIS. - SEE COMMENT. PERIPHERAL BLOOD: - MICROCYTIC-HYPOCHROMIC ANEMIA. Diagnosis Note The bone marrow is hypercellular with trilineage hematopoiesis but with relative abundance of erythroid precursors and increased number of megakaryocytes with nonspecific changes. Significant dyspoiesis is not seen. Iron stores are present with no ring sideroblasts. The plasma cells are increased in number representing 8% of all cells in the aspirate although focal areas in the core biopsy show 10 to 20% as primarily seen by CD138 stain. In situ hybridization for kappa and lambda light chains show kappa light chain restriction consistent with plasma cell neoplasm. Correlation with cytogenetic and FISH studies is recommended. (BNS:ecj/gt 04/07/2017)  IMAGE STUDIES I have personally reviewed below image results.  03/15/2017 DG bone survey Met: Nonspecific lucent lesion in the distal right ulna measuring 15-16 mm, but otherwise normal bone mineralization for age throughout the visible skeleton. A solitary lytic lesion in a distal extremity would be an unusual presentation of multiple myeloma, and I favor the distal right ulna lesion is benign. Recommend correlation with serum and urine protein electrophoresis.  04/14/2017 PET scan: 1. 2.1 cm hypermetabolic soft tissue lesion in the left heel, adjacent to the calcaneal tuberosity. 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 2. Mottled FDG accumulation diffusely in the marrow space without other frankly overt hypermetabolic bony lesion. 3. Coronary artery and thoracoabdominal aortic  atherosclerosis with abdominal aortic stent graft visualized in situ. 4. Bilateral renal cysts and bilateral nonobstructing renal stones.  08/24/2017 PET scan 1. No hypermetabolic osseous lesions. No definite signs of multiple myeloma on CT imaging. 2. Interval improvement in soft tissue activity along the medial aspect of the left calcaneal tuberosity, likely plantar fasciitis. 3. New small left pleural effusion. 4. Bilateral inguinal hernias containing small bowel on the right and sigmoid colon on the left. No evidence of incarceration or obstruction. 5. Otherwise stable incidental findings including diffuse atherosclerosis, bilateral renal cysts, emphysema and sigmoid diverticulosis.  ASSESSMENT & PLAN:  1. Multiple myeloma, remission status unspecified ([place]    #   PET scan results were discussed with patient.  Interval provement of the left calcaneal tuberosity.  Possible improvement or resolve of local inflammation. This site was not previously biopsied( see previous note). Given patient's multiple comorbidity and the side effects of cytopenia during his RVD treatment and the uncertainty of this is really a site of plasmacytoma or not, I recommend not to continue him on maintenance Revlimid.  Continue monitor disease. Patient agrees with plan.  Recent kappa/lamda light chain ratio is 5.08. Plan repeat myeloma labs in 3 months.   # Iron deficiency anemia: s/p multiple IV Venofer.  Hemoglobin improved, today Hb is 12.9.  He has had GI work up here which showed multiple bleeding and non bleeding AVMs and was referred to [facility] or [facility] for deeper enteroscopy.  Patient told me that he was not sure about the appointment with [facility] or [place].  Will touch base with Dr.Anna.    # chronic renal insufficiency stage 3,  Kidney function flucturates.  follow-up with [facility] nephrology  # continue Aspirin 49m and Plavix, close monitor platelet coutns.  # Xgeva or zometa once he obtained dental clearance.  He has not obtained dental clearance yet.   Follow up in 4 weeks, repeat cbc.,cmp All questions were answered. The patient knows to call the clinic with any problems questions or concerns.  ZEarlie Server MD, PhD Hematology Oncology CAnchorage Surgicenter LLCat AMonroe Surgical HospitalPager- [phone]/06/2017

## 2017-09-05 ENCOUNTER — Encounter: Payer: Self-pay | Admitting: Oncology

## 2017-09-05 ENCOUNTER — Inpatient Hospital Stay: Payer: Medicare Other | Attending: Oncology | Admitting: Oncology

## 2017-09-05 ENCOUNTER — Inpatient Hospital Stay: Payer: Medicare Other

## 2017-09-05 VITALS — BP 107/72 | HR 93 | Temp 96.8°F | Wt 208.7 lb

## 2017-09-05 DIAGNOSIS — D509 Iron deficiency anemia, unspecified: Secondary | ICD-10-CM | POA: Diagnosis not present

## 2017-09-05 DIAGNOSIS — C9000 Multiple myeloma not having achieved remission: Secondary | ICD-10-CM | POA: Diagnosis present

## 2017-09-05 DIAGNOSIS — D631 Anemia in chronic kidney disease: Secondary | ICD-10-CM | POA: Diagnosis not present

## 2017-09-05 DIAGNOSIS — Z7982 Long term (current) use of aspirin: Secondary | ICD-10-CM | POA: Insufficient documentation

## 2017-09-05 DIAGNOSIS — Z7902 Long term (current) use of antithrombotics/antiplatelets: Secondary | ICD-10-CM | POA: Insufficient documentation

## 2017-09-05 DIAGNOSIS — N183 Chronic kidney disease, stage 3 (moderate): Secondary | ICD-10-CM | POA: Insufficient documentation

## 2017-09-05 DIAGNOSIS — C9001 Multiple myeloma in remission: Secondary | ICD-10-CM | POA: Diagnosis not present

## 2017-09-05 LAB — CBC WITH DIFFERENTIAL/PLATELET
BASOS PCT: 0 %
Basophils Absolute: 0 10*3/uL (ref 0–0.1)
Eosinophils Absolute: 0.2 10*3/uL (ref 0–0.7)
Eosinophils Relative: 3 %
HCT: 40.2 % (ref 40.0–52.0)
HEMOGLOBIN: 12.9 g/dL — AB (ref 13.0–18.0)
LYMPHS ABS: 1.1 10*3/uL (ref 1.0–3.6)
Lymphocytes Relative: 14 %
MCH: 26.3 pg (ref 26.0–34.0)
MCHC: 32.2 g/dL (ref 32.0–36.0)
MCV: 81.6 fL (ref 80.0–100.0)
MONOS PCT: 10 %
Monocytes Absolute: 0.8 10*3/uL (ref 0.2–1.0)
NEUTROS PCT: 73 %
Neutro Abs: 5.9 10*3/uL (ref 1.4–6.5)
Platelets: 339 10*3/uL (ref 150–440)
RBC: 4.93 MIL/uL (ref 4.40–5.90)
RDW: 23.6 % — ABNORMAL HIGH (ref 11.5–14.5)
WBC: 8 10*3/uL (ref 3.8–10.6)

## 2017-09-09 ENCOUNTER — Telehealth (INDEPENDENT_AMBULATORY_CARE_PROVIDER_SITE_OTHER): Payer: Self-pay

## 2017-09-09 NOTE — Telephone Encounter (Signed)
Patient called and stated that he had a question for the nurse.  I called the patient back, I got no answer. I left a message for the patient to call me back at my direct extension.

## 2017-09-13 ENCOUNTER — Encounter: Payer: Self-pay | Admitting: Gastroenterology

## 2017-09-20 ENCOUNTER — Ambulatory Visit (INDEPENDENT_AMBULATORY_CARE_PROVIDER_SITE_OTHER): Payer: Medicare Other | Admitting: Vascular Surgery

## 2017-09-20 ENCOUNTER — Encounter (INDEPENDENT_AMBULATORY_CARE_PROVIDER_SITE_OTHER): Payer: Self-pay | Admitting: Vascular Surgery

## 2017-09-20 VITALS — BP 123/71 | HR 81 | Resp 13 | Ht 71.0 in | Wt 206.0 lb

## 2017-09-20 DIAGNOSIS — F17200 Nicotine dependence, unspecified, uncomplicated: Secondary | ICD-10-CM

## 2017-09-20 DIAGNOSIS — I739 Peripheral vascular disease, unspecified: Secondary | ICD-10-CM | POA: Diagnosis not present

## 2017-09-20 DIAGNOSIS — E785 Hyperlipidemia, unspecified: Secondary | ICD-10-CM | POA: Diagnosis not present

## 2017-09-20 DIAGNOSIS — I714 Abdominal aortic aneurysm, without rupture, unspecified: Secondary | ICD-10-CM

## 2017-09-20 DIAGNOSIS — E118 Type 2 diabetes mellitus with unspecified complications: Secondary | ICD-10-CM | POA: Diagnosis not present

## 2017-09-20 NOTE — Assessment & Plan Note (Signed)
Although his symptoms may be due to neuropathy from the chemotherapy, given a known history of peripheral arterial disease I think would be prudent to go ahead and recheck this.  We will get that moved up and see that in the near future.  We will see him back following the study.

## 2017-09-20 NOTE — Progress Notes (Signed)
MRN : [id number]  Omar [name] is a 77 y.o. (1940-09-22) male who presents with chief complaint of  Chief Complaint  Patient presents with  . Follow-up    Left leg weakness  .  History of Present Illness: Patient returns today in follow up prior to his scheduled visit due to worsening lower extremity symptoms.  He is about 4 years status post endovascular repair of an abdominal aortic aneurysm.  He is scheduled to have this and his peripheral arterial disease checked in a couple of months.  He returns prior to the scheduled visit because of pain and weakness in his legs.  Since his last visit, he has been diagnosed with multiple myeloma and was on chemotherapy for several months.  This had to be stopped for a GI bleeding issue that was recently treated at [facility]  He does feel better after that treatment in his legs do as well although he still has tingling and pins and needles sensation in his feet.  His legs tire very easily and have been giving out on him.  He reports no new ulceration or infection.  Both legs are affected.  He denies any aneurysm related symptoms.  Specifically, the patient denies new back or abdominal pain, or signs of peripheral embolization    Current Outpatient Medications  Medication Sig Dispense Refill  . acetaminophen (TYLENOL) 325 MG tablet Take 325 mg by mouth every 6 (six) hours as needed.    . ALPRAZolam (XANAX) 0.25 MG tablet Take by mouth.    Marland Kitchen amLODipine-benazepril (LOTREL) 5-20 MG capsule Take 1 capsule by mouth daily.    Marland Kitchen aspirin 81 MG chewable tablet Chew 81 mg by mouth daily.    Marland Kitchen atorvastatin (LIPITOR) 80 MG tablet Take 80 mg by mouth at bedtime.     . Baclofen 5 MG TABS Take 5 mg by mouth 3 (three) times daily as needed (hiccups). 21 tablet 0  . Butalbital-APAP-Caffeine (FIORICET) 50-300-40 MG CAPS Take 1 capsule every four to six hours as needed    . clopidogrel (PLAVIX) 75 MG tablet Take by mouth.    . DEXAMETHASONE PO Take 20 mg by  mouth See admin instructions.  3  . fexofenadine (ALLEGRA) 180 MG tablet Take 180 mg by mouth daily.    . fluticasone (FLONASE) 50 MCG/ACT nasal spray Place 2 sprays into both nostrils daily as needed for rhinitis.     . Fluticasone Furoate-Vilanterol (BREO ELLIPTA) 100-25 MCG/INH AEPB Inhale 1 puff into the lungs daily.     . folic acid (FOLVITE) 1 MG tablet Take by mouth.    . furosemide (LASIX) 20 MG tablet Take 20 mg by mouth daily.    . isosorbide mononitrate (IMDUR) 30 MG 24 hr tablet Take 30 mg by mouth daily.    Marland Kitchen loperamide (IMODIUM) 2 MG capsule Take 1 capsule (2 mg total) by mouth See admin instructions. With onset of diarrhea, take 67m followed by 264mevery 2 hours resolved. Maximum: 16 mg/day 120 capsule 0  . metFORMIN (GLUCOPHAGE) 500 MG tablet Take 500 mg by mouth daily with breakfast.     . metoprolol succinate (TOPROL-XL) 50 MG 24 hr tablet Take 50 mg by mouth daily. Take with or immediately following a meal.    . ondansetron (ZOFRAN) 8 MG tablet Take 1 tablet (8 mg total) 2 (two) times daily as needed by mouth (Nausea or vomiting). 30 tablet 1  . pantoprazole (PROTONIX) 40 MG tablet Take 1 tablet (40 mg total)  by mouth 2 (two) times daily. 60 tablet 3  . triamcinolone cream (KENALOG) 0.1 % Apply 1 application topically 2 (two) times daily.     No current facility-administered medications for this visit.     Past Medical History:  Diagnosis Date  . Anxiety   . Arthritis    rheumatoid  . Atrial fibrillation (HCC)    hx of  . Cancer ([place])   . Chronic kidney disease   . Colon polyp 07-07-15   TUBULAR ADENOMA WITH AT LEAST HIGH-GRADE / Dr Rayann Heman  . Diabetes mellitus without complication ([place])   . Dysrhythmia    bradycardia...Marland KitchenMarland Kitchen2 to 1 heart block  . GERD (gastroesophageal reflux disease)   . Heart murmur    patient unaware of history of murmur  . Hypercholesterolemia   . Hypertension   . Presence of permanent cardiac pacemaker   . Prostate cancer ([facility]) 01/01/13, 01/30/14     Gleason 3+4=7, volume 46.6 cc  . Rheumatoid arthritis ([place])   . S/P radiation therapy  04/03/2014 through 06/04/2014                                                      Prostate 7800 cGy in 40 sessions                          . Sleep apnea    uses cpap but it is not in the hospital with him    Past Surgical History:  Procedure Laterality Date  . ABDOMINAL AORTIC ANEURYSM REPAIR  11/2013  . COLON SURGERY  March 2017   Right hemicolectomy for tubulovillous adenoma with high-grade dysplasia.  . COLONOSCOPY WITH PROPOFOL N/A 07/07/2015   Procedure: COLONOSCOPY WITH PROPOFOL;  Surgeon: Josefine Class, MD;  Location: [facility] ENDOSCOPY;  Service: Endoscopy;  Laterality: N/A;  . COLONOSCOPY WITH PROPOFOL N/A 10/28/2016   Procedure: COLONOSCOPY WITH PROPOFOL;  Surgeon: [name], MD;  Location: [facility] ENDOSCOPY;  Service: Endoscopy;  Laterality: N/A;  . ESOPHAGOGASTRODUODENOSCOPY (EGD) WITH PROPOFOL N/A 10/28/2016   Procedure: ESOPHAGOGASTRODUODENOSCOPY (EGD) WITH PROPOFOL;  Surgeon: [name], MD;  Location: [facility] ENDOSCOPY;  Service: Endoscopy;  Laterality: N/A;  . GIVENS CAPSULE STUDY N/A 07/25/2017   Procedure: GIVENS CAPSULE STUDY;  Surgeon: [name], MD;  Location: [facility] ENDOSCOPY;  Service: Gastroenterology;  Laterality: N/A;  . LAPAROSCOPIC RIGHT COLECTOMY Right 08/08/2015   Procedure: LAPAROSCOPIC RIGHT COLECTOMY;  Surgeon: Robert Bellow, MD;  Location: [facility] ORS;  Service: General;  Laterality: Right;  . NASAL SINUS SURGERY    . PACEMAKER INSERTION Left 08/26/2016   Procedure: INSERTION PACEMAKER;  Surgeon: Isaias Cowman, MD;  Location: [facility] ORS;  Service: Cardiovascular;  Laterality: Left;  . PROSTATE BIOPSY  01/01/13, 01/30/14   Gleason 3+3=6, vol 46.6 cc  . TONSILLECTOMY    . uvula surgery     for sleep apnea    Social History        Social History  Substance Use Topics  . Smoking status: Current Every Day Smoker    Packs/day: 0.50    Years: 50.00    Types:  Cigarettes  . Smokeless tobacco: Never Used     Comment: DOWN TO 1/2 PPD  . Alcohol use 0.6 oz/week     1 Cans of beer per week  Comment: occasionally   Family History      Family History  Problem Relation Age of Onset  . Heart attack Mother   . Cirrhosis Father   . Cancer Brother        pancreatic  . Diabetes Brother   . Diabetes Daughter        medication induced for cancer treatments  . Cancer Daughter        breast, brain     No Known Allergies   REVIEW OF SYSTEMS (Negative unless checked)  Constitutional: '[]' Weight loss  '[]' Fever  '[]' Chills Cardiac: '[]' Chest pain   '[]' Chest pressure   '[]' Palpitations   '[]' Shortness of breath when laying flat   '[]' Shortness of breath at rest   '[x]' Shortness of breath with exertion. Vascular:  '[x]' Pain in legs with walking   '[]' Pain in legs at rest   '[]' Pain in legs when laying flat   '[]' Claudication   '[]' Pain in feet when walking  '[]' Pain in feet at rest  '[]' Pain in feet when laying flat   '[]' History of DVT   '[]' Phlebitis   '[x]' Swelling in legs   '[]' Varicose veins   '[]' Non-healing ulcers Pulmonary:   '[]' Uses home oxygen   '[]' Productive cough   '[]' Hemoptysis   '[]' Wheeze  '[]' COPD   '[]' Asthma Neurologic:  '[]' Dizziness  '[]' Blackouts   '[]' Seizures   '[]' History of stroke   '[]' History of TIA  '[]' Aphasia   '[]' Temporary blindness   '[]' Dysphagia   '[]' Weakness or numbness in arms   '[]' Weakness or numbness in legs Musculoskeletal:  '[]' Arthritis   '[]' Joint swelling   '[]' Joint pain   '[]' Low back pain Hematologic:  '[]' Easy bruising  '[]' Easy bleeding   '[]' Hypercoagulable state   '[]' Anemic   Gastrointestinal:  '[x]' Blood in stool   '[]' Vomiting blood  '[]' Gastroesophageal reflux/heartburn   '[x]' Abdominal pain Genitourinary:  '[]' Chronic kidney disease   '[]' Difficult urination  '[]' Frequent urination  '[]' Burning with urination   '[]' Hematuria Skin:  '[]' Rashes   '[]' Ulcers   '[]' Wounds Psychological:  '[]' History of anxiety   '[]'  History of major depression.    Physical Examination  BP 123/71  (BP Location: Right Arm, Patient Position: Sitting)   Pulse 81   Resp 13   Ht '5\' 11"'  (1.803 m)   Wt 93.4 kg (206 lb)   BMI 28.73 kg/m  Gen:  WD/WN, NAD Head: [place]/AT, No temporalis wasting. Ear/Nose/Throat: Hearing grossly intact, nares w/o erythema or drainage Eyes: Conjunctiva clear. Sclera non-icteric Neck: Supple.  Trachea midline Pulmonary:  Good air movement, no use of accessory muscles.  Cardiac: Irregular Vascular:  Vessel Right Left  Radial Palpable Palpable                          PT  1+ palpable  1+ palpable  DP Palpable  1+ palpable   Gastrointestinal: soft, non-tender/non-distended. No increased aortic impulse Musculoskeletal: M/S 5/5 throughout.  No deformity or atrophy.  Trace lower extremity edema. Neurologic: Sensation grossly intact in extremities.  Symmetrical.  Speech is fluent.  Psychiatric: Judgment intact, Mood & affect appropriate for pt's clinical situation. Dermatologic: No rashes or ulcers noted.  No cellulitis or open wounds.       Labs Recent Results (from the past 2160 hour(s))  Urinalysis, Complete w Microscopic     Status: Abnormal   Collection Time: 06/29/17  2:10 PM  Result Value Ref Range   Color, Urine YELLOW (A) YELLOW   APPearance CLEAR (A) CLEAR   Specific Gravity, Urine 1.015 1.005 - 1.030  pH 5.0 5.0 - 8.0   Glucose, UA NEGATIVE NEGATIVE mg/dL   Hgb urine dipstick NEGATIVE NEGATIVE   Bilirubin Urine NEGATIVE NEGATIVE   Ketones, ur NEGATIVE NEGATIVE mg/dL   Protein, ur NEGATIVE NEGATIVE mg/dL   Nitrite NEGATIVE NEGATIVE   Leukocytes, UA NEGATIVE NEGATIVE   RBC / HPF 0-5 0 - 5 RBC/hpf   WBC, UA 0-5 0 - 5 WBC/hpf   Bacteria, UA NONE SEEN NONE SEEN   Squamous Epithelial / LPF NONE SEEN NONE SEEN   Mucus PRESENT     Comment: Performed at [facility], [address] [place], [place] [address]  CBC with Differential     Status: Abnormal   Collection Time: 06/29/17  2:17 PM  Result Value Ref Range   WBC 7.1 3.8  - 10.6 K/uL   RBC 3.47 (L) 4.40 - 5.90 MIL/uL   Hemoglobin 8.4 (L) 13.0 - 18.0 g/dL   HCT 27.7 (L) 40.0 - 52.0 %   MCV 79.9 (L) 80.0 - 100.0 fL   MCH 24.4 (L) 26.0 - 34.0 pg   MCHC 30.5 (L) 32.0 - 36.0 g/dL   RDW 29.1 (H) 11.5 - 14.5 %   Platelets 209 150 - 440 K/uL   Neutrophils Relative % 78 %   Neutro Abs 5.5 1.4 - 6.5 K/uL   Lymphocytes Relative 10 %   Lymphs Abs 0.7 (L) 1.0 - 3.6 K/uL   Monocytes Relative 4 %   Monocytes Absolute 0.3 0.2 - 1.0 K/uL   Eosinophils Relative 8 %   Eosinophils Absolute 0.6 0 - 0.7 K/uL   Basophils Relative 0 %   Basophils Absolute 0.0 0 - 0.1 K/uL    Comment: Performed at [facility], [address] [place], [place] [address]  Comprehensive metabolic panel     Status: Abnormal   Collection Time: 06/29/17  2:17 PM  Result Value Ref Range   Sodium 138 135 - 145 mmol/L   Potassium 4.0 3.5 - 5.1 mmol/L   Chloride 114 (H) 101 - 111 mmol/L   CO2 18 (L) 22 - 32 mmol/L   Glucose, Bld 138 (H) 65 - 99 mg/dL   BUN 28 (H) 6 - 20 mg/dL   Creatinine, Ser 1.36 (H) 0.61 - 1.24 mg/dL   Calcium 8.1 (L) 8.9 - 10.3 mg/dL   Total Protein 6.2 (L) 6.5 - 8.1 g/dL   Albumin 3.2 (L) 3.5 - 5.0 g/dL   AST 21 15 - 41 U/L   ALT 19 17 - 63 U/L   Alkaline Phosphatase 56 38 - 126 U/L   Total Bilirubin 0.6 0.3 - 1.2 mg/dL   GFR calc non Af Amer 49 (L) >60 mL/min   GFR calc Af Amer 57 (L) >60 mL/min    Comment: (NOTE) The eGFR has been calculated using the CKD EPI equation. This calculation has not been validated in all clinical situations. eGFR's persistently <60 mL/min signify possible Chronic Kidney Disease.    Anion gap 6 5 - 15    Comment: Performed at [facility], [address] [place], [place] [address]  Iron and TIBC     Status: Abnormal   Collection Time: 07/13/17  1:20 PM  Result Value Ref Range   Iron 16 (L) 45 - 182 ug/dL   TIBC 407 250 - 450 ug/dL   Saturation Ratios 4 (L) 17.9 - 39.5 %   UIBC 391 ug/dL    Comment: Performed at  [facility], [address] [place], [place] [address]  Ferritin     Status: None   Collection Time: 07/13/17  1:20 PM  Result Value Ref Range   Ferritin 54 24 - 336 ng/mL    Comment: Performed at [facility], [address] [place], [place] [address]  Comprehensive metabolic panel     Status: Abnormal   Collection Time: 07/13/17  1:20 PM  Result Value Ref Range   Sodium 140 135 - 145 mmol/L   Potassium 4.5 3.5 - 5.1 mmol/L   Chloride 109 101 - 111 mmol/L   CO2 24 22 - 32 mmol/L   Glucose, Bld 136 (H) 65 - 99 mg/dL   BUN 29 (H) 6 - 20 mg/dL   Creatinine, Ser 1.38 (H) 0.61 - 1.24 mg/dL   Calcium 8.4 (L) 8.9 - 10.3 mg/dL   Total Protein 6.3 (L) 6.5 - 8.1 g/dL   Albumin 3.2 (L) 3.5 - 5.0 g/dL   AST 18 15 - 41 U/L   ALT 23 17 - 63 U/L   Alkaline Phosphatase 68 38 - 126 U/L   Total Bilirubin 0.4 0.3 - 1.2 mg/dL   GFR calc non Af Amer 48 (L) >60 mL/min   GFR calc Af Amer 56 (L) >60 mL/min    Comment: (NOTE) The eGFR has been calculated using the CKD EPI equation. This calculation has not been validated in all clinical situations. eGFR's persistently <60 mL/min signify possible Chronic Kidney Disease.    Anion gap 7 5 - 15    Comment: Performed at [facility], [address] [place], [place] [address]  CBC with Differential     Status: Abnormal   Collection Time: 07/13/17  1:20 PM  Result Value Ref Range   WBC 6.5 3.8 - 10.6 K/uL   RBC 3.45 (L) 4.40 - 5.90 MIL/uL   Hemoglobin 8.6 (L) 13.0 - 18.0 g/dL   HCT 27.7 (L) 40.0 - 52.0 %   MCV 80.2 80.0 - 100.0 fL   MCH 24.9 (L) 26.0 - 34.0 pg   MCHC 31.1 (L) 32.0 - 36.0 g/dL   RDW 27.7 (H) 11.5 - 14.5 %   Platelets 187 150 - 440 K/uL   Neutrophils Relative % 61 %   Lymphocytes Relative 25 %   Monocytes Relative 9 %   Eosinophils Relative 4 %   Basophils Relative 1 %   Neutro Abs 3.9 1.4 - 6.5 K/uL   Lymphs Abs 1.6 1.0 - 3.6 K/uL   Monocytes Absolute 0.6 0.2 - 1.0 K/uL   Eosinophils Absolute 0.3 0  - 0.7 K/uL   Basophils Absolute 0.1 0 - 0.1 K/uL   Smear Review PLATELETS APPEAR ADEQUATE     Comment: DIFF CONFIRMED BY MANUAL Performed at [facility], [address] [place], [place] [address]   Comprehensive metabolic panel     Status: Abnormal   Collection Time: 07/27/17 10:24 AM  Result Value Ref Range   Sodium 140 135 - 145 mmol/L   Potassium 4.2 3.5 - 5.1 mmol/L   Chloride 113 (H) 101 - 111 mmol/L   CO2 20 (L) 22 - 32 mmol/L   Glucose, Bld 107 (H) 65 - 99 mg/dL   BUN 25 (H) 6 - 20 mg/dL   Creatinine, Ser 1.45 (H) 0.61 - 1.24 mg/dL   Calcium 8.5 (L) 8.9 - 10.3 mg/dL   Total Protein 6.2 (L) 6.5 - 8.1 g/dL   Albumin 3.3 (L) 3.5 - 5.0 g/dL   AST 17 15 -  41 U/L   ALT 16 (L) 17 - 63 U/L   Alkaline Phosphatase 65 38 - 126 U/L   Total Bilirubin 0.5 0.3 - 1.2 mg/dL   GFR calc non Af Amer 46 (L) >60 mL/min   GFR calc Af Amer 53 (L) >60 mL/min    Comment: (NOTE) The eGFR has been calculated using the CKD EPI equation. This calculation has not been validated in all clinical situations. eGFR's persistently <60 mL/min signify possible Chronic Kidney Disease.    Anion gap 7 5 - 15    Comment: Performed at [facility], [address] [place], [place] [address]  CBC with Differential     Status: Abnormal   Collection Time: 07/27/17 10:24 AM  Result Value Ref Range   WBC 8.0 3.8 - 10.6 K/uL   RBC 3.63 (L) 4.40 - 5.90 MIL/uL   Hemoglobin 9.1 (L) 13.0 - 18.0 g/dL   HCT 29.1 (L) 40.0 - 52.0 %   MCV 80.2 80.0 - 100.0 fL   MCH 25.1 (L) 26.0 - 34.0 pg   MCHC 31.3 (L) 32.0 - 36.0 g/dL   RDW 26.8 (H) 11.5 - 14.5 %   Platelets 146 (L) 150 - 440 K/uL   Neutrophils Relative % 66 %   Neutro Abs 5.3 1.4 - 6.5 K/uL   Lymphocytes Relative 17 %   Lymphs Abs 1.4 1.0 - 3.6 K/uL   Monocytes Relative 10 %   Monocytes Absolute 0.8 0.2 - 1.0 K/uL   Eosinophils Relative 7 %   Eosinophils Absolute 0.5 0 - 0.7 K/uL   Basophils Relative 0 %   Basophils Absolute 0.0 0 - 0.1 K/uL    Smear Review RARE NRBCs     Comment: MIXED RBC POPULATION POLYCHROMASIA PRESENT    nRBC 1 (H) 0 /100 WBC    Comment: Performed at [facility], [address] [place], [place] [address]  Comprehensive metabolic panel     Status: Abnormal   Collection Time: 08/02/17  2:00 PM  Result Value Ref Range   Sodium 141 135 - 145 mmol/L   Potassium 3.7 3.5 - 5.1 mmol/L   Chloride 111 101 - 111 mmol/L   CO2 23 22 - 32 mmol/L   Glucose, Bld 102 (H) 65 - 99 mg/dL   BUN 40 (H) 6 - 20 mg/dL   Creatinine, Ser 1.62 (H) 0.61 - 1.24 mg/dL   Calcium 8.4 (L) 8.9 - 10.3 mg/dL   Total Protein 6.9 6.5 - 8.1 g/dL   Albumin 3.6 3.5 - 5.0 g/dL   AST 17 15 - 41 U/L   ALT 19 17 - 63 U/L   Alkaline Phosphatase 80 38 - 126 U/L   Total Bilirubin 0.4 0.3 - 1.2 mg/dL   GFR calc non Af Amer 40 (L) >60 mL/min   GFR calc Af Amer 46 (L) >60 mL/min    Comment: (NOTE) The eGFR has been calculated using the CKD EPI equation. This calculation has not been validated in all clinical situations. eGFR's persistently <60 mL/min signify possible Chronic Kidney Disease.    Anion gap 7 5 - 15    Comment: Performed at [facility], [address] [place], [place] [address]  CBC with Differential/Platelet     Status: Abnormal   Collection Time: 08/02/17  2:00 PM  Result Value Ref Range   WBC 7.7 3.8 - 10.6 K/uL   RBC 3.71 (L) 4.40 - 5.90 MIL/uL   Hemoglobin 9.3 (L) 13.0 - 18.0  g/dL   HCT 30.5 (L) 40.0 - 52.0 %   MCV 82.1 80.0 - 100.0 fL   MCH 25.0 (L) 26.0 - 34.0 pg   MCHC 30.5 (L) 32.0 - 36.0 g/dL   RDW 27.3 (H) 11.5 - 14.5 %   Platelets 138 (L) 150 - 440 K/uL   Neutrophils Relative % 68 %   Neutro Abs 5.2 1.4 - 6.5 K/uL   Lymphocytes Relative 14 %   Lymphs Abs 1.1 1.0 - 3.6 K/uL   Monocytes Relative 13 %   Monocytes Absolute 1.0 0.2 - 1.0 K/uL   Eosinophils Relative 4 %   Eosinophils Absolute 0.3 0 - 0.7 K/uL   Basophils Relative 1 %   Basophils Absolute 0.1 0 - 0.1 K/uL    Comment: Performed at  [facility], [address] [place], [place] [address]  Glucose, capillary     Status: Abnormal   Collection Time: 08/24/17  8:39 AM  Result Value Ref Range   Glucose-Capillary 101 (H) 65 - 99 mg/dL  Comprehensive metabolic panel     Status: Abnormal   Collection Time: 08/26/17 11:45 AM  Result Value Ref Range   Sodium 138 135 - 145 mmol/L   Potassium 4.0 3.5 - 5.1 mmol/L   Chloride 109 101 - 111 mmol/L   CO2 21 (L) 22 - 32 mmol/L   Glucose, Bld 105 (H) 65 - 99 mg/dL   BUN 13 6 - 20 mg/dL   Creatinine, Ser 1.14 0.61 - 1.24 mg/dL   Calcium 8.9 8.9 - 10.3 mg/dL   Total Protein 7.3 6.5 - 8.1 g/dL   Albumin 3.7 3.5 - 5.0 g/dL   AST 13 (L) 15 - 41 U/L   ALT 12 (L) 17 - 63 U/L   Alkaline Phosphatase 86 38 - 126 U/L   Total Bilirubin 0.9 0.3 - 1.2 mg/dL   GFR calc non Af Amer >60 >60 mL/min   GFR calc Af Amer >60 >60 mL/min    Comment: (NOTE) The eGFR has been calculated using the CKD EPI equation. This calculation has not been validated in all clinical situations. eGFR's persistently <60 mL/min signify possible Chronic Kidney Disease.    Anion gap 8 5 - 15    Comment: Performed at [facility], [address] [place], [place] [address]  Iron and TIBC     Status: Abnormal   Collection Time: 08/26/17 11:45 AM  Result Value Ref Range   Iron 11 (L) 45 - 182 ug/dL   TIBC 384 250 - 450 ug/dL   Saturation Ratios 3 (L) 17.9 - 39.5 %   UIBC 373 ug/dL    Comment: Performed at [facility], [address] [place], [place] [address]  Ferritin     Status: None   Collection Time: 08/26/17 11:45 AM  Result Value Ref Range   Ferritin 54 24 - 336 ng/mL    Comment: Performed at [facility], [address] [place], [place] [address]  Kappa/lambda light chains     Status: Abnormal   Collection Time: 08/26/17 11:45 AM  Result Value Ref Range   Kappa free light chain 122.5 (H) 3.3 - 19.4 mg/L   Lamda free light chains 24.1 5.7 - 26.3 mg/L   Kappa, lamda  light chain ratio 5.08 (H) 0.26 - 1.65    Comment: (NOTE) Performed At: [facility] [address] [place], [place] [id number] Rush Farmer MD [id number] Performed at [facility], [address],  [place], [place] [address]   Protein electrophoresis, serum     Status: None   Collection Time: 08/26/17 11:50 AM  Result Value Ref Range   Total Protein ELP 6.6 6.0 - 8.5 g/dL   Albumin ELP 3.4 2.9 - 4.4 g/dL   Alpha-1-Globulin 0.3 0.0 - 0.4 g/dL   Alpha-2-Globulin 0.8 0.4 - 1.0 g/dL   Beta Globulin 1.0 0.7 - 1.3 g/dL   Gamma Globulin 1.1 0.4 - 1.8 g/dL   M-Spike, % Not Observed Not Observed g/dL   SPE Interp. Comment     Comment: (NOTE) The SPE pattern appears essentially unremarkable. Evidence of monoclonal protein is not apparent. Performed At: [facility] [place], [place] [id number] Rush Farmer MD [id number]    Comment Comment     Comment: (NOTE) Protein electrophoresis scan will follow via computer, mail, or courier delivery.    GLOBULIN, TOTAL 3.2 2.2 - 3.9 g/dL   A/G Ratio 1.1 0.7 - 1.7    Comment: Performed at [facility], [address] [place], [place] [address]  CBC with Differential/Platelet     Status: Abnormal   Collection Time: 09/05/17 11:52 AM  Result Value Ref Range   WBC 8.0 3.8 - 10.6 K/uL   RBC 4.93 4.40 - 5.90 MIL/uL   Hemoglobin 12.9 (L) 13.0 - 18.0 g/dL   HCT 40.2 40.0 - 52.0 %   MCV 81.6 80.0 - 100.0 fL   MCH 26.3 26.0 - 34.0 pg   MCHC 32.2 32.0 - 36.0 g/dL   RDW 23.6 (H) 11.5 - 14.5 %   Platelets 339 150 - 440 K/uL   Neutrophils Relative % 73 %   Neutro Abs 5.9 1.4 - 6.5 K/uL   Lymphocytes Relative 14 %   Lymphs Abs 1.1 1.0 - 3.6 K/uL   Monocytes Relative 10 %   Monocytes Absolute 0.8 0.2 - 1.0 K/uL   Eosinophils Relative 3 %   Eosinophils Absolute 0.2 0 - 0.7 K/uL   Basophils Relative 0 %   Basophils Absolute 0.0 0 - 0.1 K/uL    Comment: Performed at [facility], [address] [place], [place] [address]    Radiology Nm Pet Image Restage (ps) Whole Body  Result Date: 08/24/2017 CLINICAL DATA:  Subsequent treatment strategy for multiple myeloma. Undergoing oral therapy. Right hip pain for 1 week. EXAM: NUCLEAR MEDICINE PET WHOLE BODY TECHNIQUE: 11.45 mCi F-18 FDG was injected intravenously. Full-ring PET imaging was performed from the skull base to thigh after the radiotracer. CT data was obtained and used for attenuation correction and anatomic localization. Fasting blood glucose: 101 mg/dl Mediastinal blood pool activity: SUV max 3.3 COMPARISON:  PET-CT 04/14/2017 FINDINGS: HEAD/NECK: No hypermetabolic cervical lymph nodes are identified.There are no lesions of the pharyngeal mucosal space. No intracranial abnormalities are seen. Incidental CT findings: Bilateral carotid atherosclerosis. CHEST: There are no hypermetabolic mediastinal, hilar or axillary lymph nodes. There is no hypermetabolic pulmonary activity. Incidental CT findings: Cardiomegaly and a left-sided subclavian pacemaker are noted. There is diffuse atherosclerosis of the aorta, great vessels and coronary arteries. There is a new small left pleural effusion. There is underlying emphysema with mild dependent atelectasis bilaterally. No focal airspace disease or suspicious nodule. ABDOMEN/PELVIS: There is no hypermetabolic activity within the liver, adrenal glands, spleen or pancreas. There is no hypermetabolic nodal activity. There is prominent activity throughout the colon which is similar to the prior study and without focal CT correlate. Incidental CT findings: Bilateral renal cysts are again  noted. There are renovascular calcifications bilaterally. Patient is status post endograft repair of an abdominal aortic aneurysm. The excluded aneurysm appears stable in size, measuring up to 4.6 cm AP. There are postsurgical changes at the cecum. Sigmoid colon diverticular changes are present. Patient has bilateral  inguinal hernias containing small bowel on the right and sigmoid colon on the left. No signs of incarceration or bowel obstruction. SKELETON: No hypermetabolic osseous activity. Incidental CT findings: No lytic lesion or pathologic fracture identified. Mild lumbar spondylosis. EXTREMITIES: Previously noted hypermetabolic soft tissue activity along the medial aspect of the left calcaneal tuberosity has improved. This now has an SUV max of 3.1. Mild corresponding soft tissue thickening in this area without focal mass lesion. Incidental CT findings: No evidence of lytic lesion or pathologic fracture. Glenohumeral degenerative changes are noted bilaterally. IMPRESSION: 1. No hypermetabolic osseous lesions. No definite signs of multiple myeloma on CT imaging. 2. Interval improvement in soft tissue activity along the medial aspect of the left calcaneal tuberosity, likely plantar fasciitis. 3. New small left pleural effusion. 4. Bilateral inguinal hernias containing small bowel on the right and sigmoid colon on the left. No evidence of incarceration or obstruction. 5. Otherwise stable incidental findings including diffuse atherosclerosis, bilateral renal cysts, emphysema and sigmoid diverticulosis. Electronically Signed   By: Richardean Sale M.D.   On: 08/24/2017 13:41    Assessment/Plan BP (high blood pressure) blood pressure control important in reducing the progression of atherosclerotic disease. On appropriate oral medications.   HLD (hyperlipidemia) lipid control important in reducing the progression of atherosclerotic disease. Continue statin therapy   Tobacco use disorder We have discussed this many times and he understands it is deleterious to his vascular system but he really has no interest in quitting.   AAA (abdominal aortic aneurysm) without rupture ([place]) To be checked again in June.  We can try to move that up although I do not think it is the primary cause of his symptoms.  We are going  to move up his leg studies.  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  We will get that moved up and see that in the near future.  We will see him back following the study.    Leotis Pain, MD  09/20/2017 4:54 PM    This note was created with Dragon medical transcription system.  Any errors from dictation are purely unintentional

## 2017-09-20 NOTE — Patient Instructions (Signed)
Peripheral Vascular Disease Peripheral vascular disease (PVD) is a disease of the blood vessels that are not part of your heart and brain. A simple term for PVD is poor circulation. In most cases, PVD narrows the blood vessels that carry blood from your heart to the rest of your body. This can result in a decreased supply of blood to your arms, legs, and internal organs, like your stomach or kidneys. However, it most often affects a person's lower legs and feet. There are two types of PVD.  Organic PVD. This is the more common type. It is caused by damage to the structure of blood vessels.  Functional PVD. This is caused by conditions that make blood vessels contract and tighten (spasm).  Without treatment, PVD tends to get worse over time. PVD can also lead to acute ischemic limb. This is when an arm or limb suddenly has trouble getting enough blood. This is a medical emergency. What are the causes? Each type of PVD has many different causes. The most common cause of PVD is buildup of a fatty material (plaque) inside of your arteries (atherosclerosis). Small amounts of plaque can break off from the walls of the blood vessels and become lodged in a smaller artery. This blocks blood flow and can cause acute ischemic limb. Other common causes of PVD include:  Blood clots that form inside of blood vessels.  Injuries to blood vessels.  Diseases that cause inflammation of blood vessels or cause blood vessel spasms.  Health behaviors and health history that increase your risk of developing PVD.  What increases the risk? You may have a greater risk of PVD if you:  Have a family history of PVD.  Have certain medical conditions, including: ? High cholesterol. ? Diabetes. ? High blood pressure (hypertension). ? Coronary heart disease. ? Past problems with blood clots. ? Past injury, such as burns or a broken bone. These may have damaged blood vessels in your limbs. ? Buerger disease. This is  caused by inflamed blood vessels in your hands and feet. ? Some forms of arthritis. ? Rare birth defects that affect the arteries in your legs.  Use tobacco.  Do not get enough exercise.  Are obese.  Are age 50 or older.  What are the signs or symptoms? PVD may cause many different symptoms. Your symptoms depend on what part of your body is not getting enough blood. Some common signs and symptoms include:  Cramps in your lower legs. This may be a symptom of poor leg circulation (claudication).  Pain and weakness in your legs while you are physically active that goes away when you rest (intermittent claudication).  Leg pain when at rest.  Leg numbness, tingling, or weakness.  Coldness in a leg or foot, especially when compared with the other leg.  Skin or hair changes. These can include: ? Hair loss. ? Shiny skin. ? Pale or bluish skin. ? Thick toenails.  Inability to get or maintain an erection (erectile dysfunction).  People with PVD are more prone to developing ulcers and sores on their toes, feet, or legs. These may take longer than normal to heal. How is this diagnosed? Your health care provider may diagnose PVD from your signs and symptoms. The health care provider will also do a physical exam. You may have tests to find out what is causing your PVD and determine its severity. Tests may include:  Blood pressure recordings from your arms and legs and measurements of the strength of your pulses (  pulse volume recordings).  Imaging studies using sound waves to take pictures of the blood flow through your blood vessels (Doppler ultrasound).  Injecting a dye into your blood vessels before having imaging studies using: ? X-rays (angiogram or arteriogram). ? Computer-generated X-rays (CT angiogram). ? A powerful electromagnetic field and a computer (magnetic resonance angiogram or MRA).  How is this treated? Treatment for PVD depends on the cause of your condition and the  severity of your symptoms. It also depends on your age. Underlying causes need to be treated and controlled. These include long-lasting (chronic) conditions, such as diabetes, high cholesterol, and high blood pressure. You may need to first try making lifestyle changes and taking medicines. Surgery may be needed if these do not work. Lifestyle changes may include:  Quitting smoking.  Exercising regularly.  Following a low-fat, low-cholesterol diet.  Medicines may include:  Blood thinners to prevent blood clots.  Medicines to improve blood flow.  Medicines to improve your blood cholesterol levels.  Surgical procedures may include:  A procedure that uses an inflated balloon to open a blocked artery and improve blood flow (angioplasty).  A procedure to put in a tube (stent) to keep a blocked artery open (stent implant).  Surgery to reroute blood flow around a blocked artery (peripheral bypass surgery).  Surgery to remove dead tissue from an infected wound on the affected limb.  Amputation. This is surgical removal of the affected limb. This may be necessary in cases of acute ischemic limb that are not improved through medical or surgical treatments.  Follow these instructions at home:  Take medicines only as directed by your health care provider.  Do not use any tobacco products, including cigarettes, chewing tobacco, or electronic cigarettes. If you need help quitting, ask your health care provider.  Lose weight if you are overweight, and maintain a healthy weight as directed by your health care provider.  Eat a diet that is low in fat and cholesterol. If you need help, ask your health care provider.  Exercise regularly. Ask your health care provider to suggest some good activities for you.  Use compression stockings or other mechanical devices as directed by your health care provider.  Take good care of your feet. ? Wear comfortable shoes that fit well. ? Check your feet  often for any cuts or sores. Contact a health care provider if:  You have cramps in your legs while walking.  You have leg pain when you are at rest.  You have coldness in a leg or foot.  Your skin changes.  You have erectile dysfunction.  You have cuts or sores on your feet that are not healing. Get help right away if:  Your arm or leg turns cold and blue.  Your arms or legs become red, warm, swollen, painful, or numb.  You have chest pain or trouble breathing.  You suddenly have weakness in your face, arm, or leg.  You become very confused or lose the ability to speak.  You suddenly have a very bad headache or lose your vision. This information is not intended to replace advice given to you by your health care provider. Make sure you discuss any questions you have with your health care provider. Document Released: 07/01/2004 Document Revised: 10/30/2015 Document Reviewed: 11/01/2013 [employer] Interactive Patient Education  2017 [employer]  

## 2017-09-20 NOTE — Assessment & Plan Note (Signed)
To be checked again in June.  We can try to move that up although I do not think it is the primary cause of his symptoms.  We are going to move up his leg studies.

## 2017-10-03 ENCOUNTER — Other Ambulatory Visit: Payer: Self-pay | Admitting: Oncology

## 2017-10-03 ENCOUNTER — Inpatient Hospital Stay (HOSPITAL_BASED_OUTPATIENT_CLINIC_OR_DEPARTMENT_OTHER): Payer: Medicare Other | Admitting: Oncology

## 2017-10-03 ENCOUNTER — Other Ambulatory Visit: Payer: Self-pay

## 2017-10-03 ENCOUNTER — Encounter: Payer: Self-pay | Admitting: Oncology

## 2017-10-03 ENCOUNTER — Inpatient Hospital Stay: Payer: Medicare Other

## 2017-10-03 VITALS — BP 118/76 | HR 87 | Temp 97.7°F | Wt 199.2 lb

## 2017-10-03 DIAGNOSIS — D631 Anemia in chronic kidney disease: Secondary | ICD-10-CM | POA: Diagnosis not present

## 2017-10-03 DIAGNOSIS — C9000 Multiple myeloma not having achieved remission: Secondary | ICD-10-CM | POA: Diagnosis not present

## 2017-10-03 DIAGNOSIS — D509 Iron deficiency anemia, unspecified: Secondary | ICD-10-CM

## 2017-10-03 DIAGNOSIS — M069 Rheumatoid arthritis, unspecified: Secondary | ICD-10-CM

## 2017-10-03 DIAGNOSIS — N183 Chronic kidney disease, stage 3 unspecified: Secondary | ICD-10-CM

## 2017-10-03 LAB — COMPREHENSIVE METABOLIC PANEL
ALT: 38 U/L (ref 17–63)
AST: 32 U/L (ref 15–41)
Albumin: 3.9 g/dL (ref 3.5–5.0)
Alkaline Phosphatase: 122 U/L (ref 38–126)
Anion gap: 9 (ref 5–15)
BUN: 19 mg/dL (ref 6–20)
CHLORIDE: 107 mmol/L (ref 101–111)
CO2: 23 mmol/L (ref 22–32)
CREATININE: 1.35 mg/dL — AB (ref 0.61–1.24)
Calcium: 9.5 mg/dL (ref 8.9–10.3)
GFR, EST AFRICAN AMERICAN: 58 mL/min — AB (ref >60)
GFR, EST NON AFRICAN AMERICAN: 50 mL/min — AB (ref >60)
Glucose, Bld: 120 mg/dL — ABNORMAL HIGH (ref 65–99)
POTASSIUM: 4.4 mmol/L (ref 3.5–5.1)
SODIUM: 139 mmol/L (ref 135–145)
Total Bilirubin: 0.4 mg/dL (ref 0.3–1.2)
Total Protein: 8.7 g/dL — ABNORMAL HIGH (ref 6.5–8.1)

## 2017-10-03 LAB — CBC WITH DIFFERENTIAL/PLATELET
Basophils Absolute: 0.1 10*3/uL (ref 0–0.1)
Basophils Relative: 1 %
EOS ABS: 0.4 10*3/uL (ref 0–0.7)
Eosinophils Relative: 4 %
HCT: 44.8 % (ref 40.0–52.0)
HEMOGLOBIN: 14.6 g/dL (ref 13.0–18.0)
LYMPHS ABS: 1.1 10*3/uL (ref 1.0–3.6)
LYMPHS PCT: 13 %
MCH: 26.1 pg (ref 26.0–34.0)
MCHC: 32.5 g/dL (ref 32.0–36.0)
MCV: 80.3 fL (ref 80.0–100.0)
MONOS PCT: 7 %
Monocytes Absolute: 0.6 10*3/uL (ref 0.2–1.0)
NEUTROS PCT: 75 %
Neutro Abs: 6.4 10*3/uL (ref 1.4–6.5)
Platelets: 274 10*3/uL (ref 150–440)
RBC: 5.58 MIL/uL (ref 4.40–5.90)
RDW: 21.5 % — ABNORMAL HIGH (ref 11.5–14.5)
WBC: 8.5 10*3/uL (ref 3.8–10.6)

## 2017-10-03 NOTE — Progress Notes (Signed)
Patient here today for follow up.  Patient c/o burning in both feet that started a couple of weeks of go.

## 2017-10-03 NOTE — Progress Notes (Signed)
Hematology/Oncology Follow Up Note [facility] Telephone:([phone] [phone]  Patient Care Team: Jodi Marble, MD as PCP - General (Internal Medicine) Bary Castilla, Forest Gleason, MD (General Surgery) Edrick Kins, MD as Rounding Team (Internal Medicine) Hillary Bow, MD as Consulting Physician (Internal Medicine) [name], MD as Surgeon (Gastroenterology) Isaias Cowman, MD as Consulting Physician (Cardiology)  REFERRING PROVIDER: Dr.Lateef, [place] CHIEF COMPLAINTS/REASON FOR VISIT  Abnormal UPEP results, follow up for evaluation of monoclonal gammopathy  HISTORY OF PRESENTING ILLNESS:  77 y.o.  male with PMH listed below who presents to follow up on the evaluation and management of his abnormal urine protein electrophoresis results. I reviewed the records from [facility], [place] and [place] was performed and HemOnc related medical problems are listed below.  He lives with a friend. He has 3 adult children.   1 Chronic Kidney disease, Stage III: patient follows up with Dr.Lateef.  2 Proteinuria:  02/18/2017: Albumin/Creatinine ratio was 7, Urine protein electrophoresis,Random Urine revealed M Spike 43.2%, total protein of 43.55m/dl,  01/05/2017 Albumin/Creatinine ratio was 455.2, Albumin 996.5, , 2.15.2018 Albumin/Creatinine ratio was 80.3, Albumin 73.3, Urine protein electrophoresis,Random Urine revealed M Spike 29.7%, total protein of 29.173mdl, 06/30/2016 Serum protein electrophoresis did not detect M spike.  Autoimmune disorder work up showed Negative anti dsDNA, RNP antibodies, smith antibody, Sjogren antibodies, anti Jo-1, positive antichromotin antibodies, positive ANA,  3 Anemia of chronic kidney disease:  4 Iron deficiency anemia: history of iron deficiency, ferritin was 7 on 08/11/2016. He had EGD and colonoscopy that were done this year which showed  duodenitis/esphagitis/diverticulosis/benigh polyps. 5 Rheumatoid arthritis: he follows up with Dr.Kernodle and is on chronic MTX. He Is currently off MTX reports joint pains are controlled, not quite symptomatic.   Patient denies any persistent bone pain or any pain. He continue to feel lack of energy, persistent, not improved with resting or taking naps. He also has lost 25 pounds in the past year which was unintentional.   # Rheumatoid arthritis and currently is off MTX.  # It is ambiguous whether he has truly symptomatic multiple myeloma or a smoldering myeloma, given that anemia can be secondary to iron deficiency and CKD.  The hypermetabolic 2.1 cm hypermetabolic soft tissue lesion in the left heel, can reflect a Plasmacytoma,which should have been biopsied. However patient initially denies being on any blood thinner, later he was found to be on Plavix with pre biopsy questionnaire screening, and biopsy was held. Patient has to contact cardiologist to hold plavix for seven days before procedure. He feels frustrated about multiple workup and meanwhile he becomes more fatigues, with worsening of kidney function. Patient was reluctant about proceeding additional invasive procedures and wants to start treatment. Decision was made to start active MM treatment.   # [facility] for autologous bone marrow transplant. He is not considered to  good candidate for transplant. Patient cardiologist Dr. KaChancy Milroyad started patient on Lasix 20 mg daily. [facility] as patient was having dizziness and borderline blood pressure during his clinic visit there.   Current Treatment:  S/p RVD x 4 cycles tolerates well except cytopenia.  Revlimid 10 mg PO once per day on days 1 to 14 (dose reduced due to GFR) Revlimid renal dosing of 1066m Velcade 1.3 mg/m2 IV once per day on days 1,  8, 15 Dexamethasone 20 mg PO once per day on days  1, 8, 15 given patient's diabetes Given patient's multiple comorbidity and the side effects of cytopenia during his RVD treatment and the uncertainty of this is really a site of plasmacytoma or not, I recommend not to continue him on maintenance Revlimid.   INTERVAL HISTORY Patient presents for follow-up of management of multiple myeloma.  He reports that fatigue is much better. During the interval he has had small bowel AVM ablated at [facility]. Continue to have SOB with exertion. Chronic numbness and tingling of bilateral lower extremity.  Denies fever chills, night sweat. Appetite is fair, reports appetite is not as good as previously. Food does not taste good.      Review of Systems  Constitutional: Positive for unexpected weight change. Negative for appetite change, chills, diaphoresis, fatigue and fever.  HENT:   Negative for hearing loss, lump/mass, mouth sores, nosebleeds, sore throat and tinnitus.   Eyes: Negative for eye problems and icterus.  Respiratory: Negative for chest tightness, cough, hemoptysis, shortness of breath and wheezing.        SOB with exertion..  Cardiovascular: Negative for chest pain and leg swelling.  Gastrointestinal: Negative for abdominal distention, abdominal pain, blood in stool, constipation, diarrhea, nausea and rectal pain.  Endocrine: Negative for hot flashes.  Genitourinary: Negative for bladder incontinence, difficulty urinating, dysuria, frequency, hematuria and nocturia.   Musculoskeletal: Negative for arthralgias, back pain, flank pain, gait problem and myalgias.  Skin: Negative for itching and rash.  Neurological: Positive for numbness. Negative for dizziness, gait problem, headaches and seizures.  Hematological: Negative for adenopathy. Does not bruise/bleed easily.  Psychiatric/Behavioral: Negative for confusion, decreased concentration and depression. The patient is not nervous/anxious.      MEDICAL HISTORY:  Past Medical History:  Diagnosis  Date  . Anxiety   . Arthritis    rheumatoid  . Atrial fibrillation (HCC)    hx of  . Cancer ([place])   . Chronic kidney disease   . Colon polyp 07-07-15   TUBULAR ADENOMA WITH AT LEAST HIGH-GRADE / Dr Rayann Heman  . Diabetes mellitus without complication ([place])   . Dysrhythmia    bradycardia...Marland KitchenMarland Kitchen2 to 1 heart block  . GERD (gastroesophageal reflux disease)   . Heart murmur    patient unaware of history of murmur  . Hypercholesterolemia   . Hypertension   . Presence of permanent cardiac pacemaker   . Prostate cancer ([place]) 01/01/13, 01/30/14   Gleason 3+4=7, volume 46.6 cc  . Rheumatoid arthritis ([place])   . S/P radiation therapy  04/03/2014 through 06/04/2014                                                      Prostate 7800 cGy in 40 sessions                          . Sleep apnea    uses cpap but it is not in the hospital with him    SURGICAL HISTORY: Past Surgical History:  Procedure Laterality Date  . ABDOMINAL AORTIC ANEURYSM REPAIR  11/2013  . COLON SURGERY  March 2017   Right hemicolectomy for tubulovillous adenoma with high-grade dysplasia.  . COLONOSCOPY WITH PROPOFOL N/A 07/07/2015   Procedure: COLONOSCOPY WITH PROPOFOL;  Surgeon: Josefine Class, MD;  Location: [facility] ENDOSCOPY;  Service: Endoscopy;  Laterality: N/A;  .  COLONOSCOPY WITH PROPOFOL N/A 10/28/2016   Procedure: COLONOSCOPY WITH PROPOFOL;  Surgeon: [name], MD;  Location: [facility] ENDOSCOPY;  Service: Endoscopy;  Laterality: N/A;  . ESOPHAGOGASTRODUODENOSCOPY (EGD) WITH PROPOFOL N/A 10/28/2016   Procedure: ESOPHAGOGASTRODUODENOSCOPY (EGD) WITH PROPOFOL;  Surgeon: [name], MD;  Location[facility] ENDOSCOPY;  Service: Endoscopy;  Laterality: N/A;  . GIVENS CAPSULE STUDY N/A 07/25/2017   Procedure: GIVENS CAPSULE STUDY;  Surgeon: [name], MD;  Location: [facility] ENDOSCOPY;  Service: Gastroenterology;  Laterality: N/A;  . LAPAROSCOPIC RIGHT COLECTOMY Right 08/08/2015   Procedure: LAPAROSCOPIC RIGHT COLECTOMY;  Surgeon: Robert Bellow, MD;  Location: [facility] ORS;  Service: General;  Laterality: Right;  . NASAL SINUS SURGERY    . PACEMAKER INSERTION Left 08/26/2016   Procedure: INSERTION PACEMAKER;  Surgeon: Isaias Cowman, MD;  Location: [facility] ORS;  Service: Cardiovascular;  Laterality: Left;  . PROSTATE BIOPSY  01/01/13, 01/30/14   Gleason 3+3=6, vol 46.6 cc  . TONSILLECTOMY    . uvula surgery     for sleep apnea    SOCIAL HISTORY: Social History   Socioeconomic History  . Marital status: Divorced    Spouse name: Not on file  . Number of children: Not on file  . Years of education: Not on file  . Highest education level: Not on file  Occupational History  . Not on file  Social Needs  . Financial resource strain: Not on file  . Food insecurity:    Worry: Not on file    Inability: Not on file  . Transportation needs:    Medical: Not on file    Non-medical: Not on file  Tobacco Use  . Smoking status: Current Every Day Smoker    Packs/day: 0.50    Years: 50.00    Pack years: 25.00    Types: Cigarettes  . Smokeless tobacco: Never Used  . Tobacco comment: DOWN TO 1/2 PPD  Substance and Sexual Activity  . Alcohol use: Yes    Alcohol/week: 0.6 oz    Types: 1 Cans of beer per week    Comment: occasionally  . Drug use: No  . Sexual activity: Not Currently  Lifestyle  . Physical activity:    Days per week: Not on file    Minutes per session: Not on file  . Stress: Not on file  Relationships  . Social connections:    Talks on phone: Not on file    Gets together: Not on file    Attends religious service: Not on file    Active member of club or organization: Not on file    Attends meetings of clubs or organizations: Not on file    Relationship status: Not on file  . Intimate partner violence:    Fear of current or ex partner: Not on file    Emotionally abused: Not on file    Physically abused: Not on file    Forced sexual activity: Not on file  Other Topics Concern  . Not on file  Social  History Narrative  . Not on file    FAMILY HISTORY: Family History  Problem Relation Age of Onset  . Heart attack Mother   . Cirrhosis Father   . Pancreatic cancer Brother   . Diabetes Brother   . Diabetes Daughter        medication induced for cancer treatments  . Cancer Daughter        breast, brain    ALLERGIES:  has No Known Allergies.  MEDICATIONS:  Current  Outpatient Medications  Medication Sig Dispense Refill  . acetaminophen (TYLENOL) 325 MG tablet Take 325 mg by mouth every 6 (six) hours as needed.    . ALPRAZolam (XANAX) 0.25 MG tablet Take by mouth.    Marland Kitchen amLODipine-benazepril (LOTREL) 5-20 MG capsule Take 1 capsule by mouth daily.    Marland Kitchen aspirin 81 MG chewable tablet Chew 81 mg by mouth daily.    Marland Kitchen atorvastatin (LIPITOR) 80 MG tablet Take 80 mg by mouth at bedtime.     . Baclofen 5 MG TABS Take 5 mg by mouth 3 (three) times daily as needed (hiccups). 21 tablet 0  . Butalbital-APAP-Caffeine (FIORICET) 50-300-40 MG CAPS Take 1 capsule every four to six hours as needed    . clopidogrel (PLAVIX) 75 MG tablet Take by mouth.    . DEXAMETHASONE PO Take 20 mg by mouth See admin instructions.  3  . fexofenadine (ALLEGRA) 180 MG tablet Take 180 mg by mouth daily.    . fluticasone (FLONASE) 50 MCG/ACT nasal spray Place 2 sprays into both nostrils daily as needed for rhinitis.     . Fluticasone Furoate-Vilanterol (BREO ELLIPTA) 100-25 MCG/INH AEPB Inhale 1 puff into the lungs daily.     . folic acid (FOLVITE) 1 MG tablet Take by mouth.    . furosemide (LASIX) 20 MG tablet Take 20 mg by mouth daily.    . isosorbide mononitrate (IMDUR) 30 MG 24 hr tablet Take 30 mg by mouth daily.    Marland Kitchen loperamide (IMODIUM) 2 MG capsule Take 1 capsule (2 mg total) by mouth See admin instructions. With onset of diarrhea, take 4m followed by 243mevery 2 hours resolved. Maximum: 16 mg/day 120 capsule 0  . metFORMIN (GLUCOPHAGE) 500 MG tablet Take 500 mg by mouth daily with breakfast.     . metoprolol  succinate (TOPROL-XL) 50 MG 24 hr tablet Take 50 mg by mouth daily. Take with or immediately following a meal.    . ondansetron (ZOFRAN) 8 MG tablet Take 1 tablet (8 mg total) 2 (two) times daily as needed by mouth (Nausea or vomiting). 30 tablet 1  . pantoprazole (PROTONIX) 40 MG tablet Take 1 tablet (40 mg total) by mouth 2 (two) times daily. 60 tablet 3  . triamcinolone cream (KENALOG) 0.1 % Apply 1 application topically 2 (two) times daily.     No current facility-administered medications for this visit.       . Marland KitchenPHYSICAL EXAMINATION: ECOG PERFORMANCE STATUS: 1 - Symptomatic but completely ambulatory Vitals:   10/03/17 1052  BP: 118/76  Pulse: 87  Temp: 97.7 F (36.5 C)   Filed Weights   10/03/17 1052  Weight: 199 lb 4 oz (90.4 kg)   Physical Exam  Constitutional: He is oriented to person, place, and time and well-developed, well-nourished, and in no distress. No distress.  HENT:  Head: Normocephalic and atraumatic.  Nose: Nose normal.  Mouth/Throat: Oropharynx is clear and moist. No oropharyngeal exudate.  Eyes: Pupils are equal, round, and reactive to light. EOM are normal. Right eye exhibits no discharge. Left eye exhibits no discharge. No scleral icterus.  Neck: Normal range of motion. Neck supple. No JVD present.  Cardiovascular: Normal rate, regular rhythm and normal heart sounds. Exam reveals no friction rub.  No murmur heard. Pulmonary/Chest: Effort normal and breath sounds normal. No respiratory distress. He has no wheezes. He has no rales. He exhibits no tenderness.  Abdominal: Soft. Bowel sounds are normal. He exhibits no distension and no mass. There is  no tenderness. There is no rebound.  Musculoskeletal: Normal range of motion. He exhibits no edema or tenderness.  Lymphadenopathy:    He has no cervical adenopathy.  Neurological: He is alert and oriented to person, place, and time. No cranial nerve deficit. He exhibits normal muscle tone. Coordination normal.    Skin: Skin is warm and dry. No rash noted. He is not diaphoretic. No erythema.  Psychiatric: Affect and judgment normal.    LABORATORY DATA:  I have reviewed the data as listed Lab Results  Component Value Date   WBC 8.0 09/05/2017   HGB 12.9 (L) 09/05/2017   HCT 40.2 09/05/2017   MCV 81.6 09/05/2017   PLT 339 09/05/2017   Recent Labs    07/27/17 1024 08/02/17 1400 08/26/17 1145  NA 140 141 138  K 4.2 3.7 4.0  CL 113* 111 109  CO2 20* 23 21*  GLUCOSE 107* 102* 105*  BUN 25* 40* 13  CREATININE 1.45* 1.62* 1.14  CALCIUM 8.5* 8.4* 8.9  GFRNONAA 46* 40* >60  GFRAA 53* 46* >60  PROT 6.2* 6.9 7.3  ALBUMIN 3.3* 3.6 3.7  AST 17 17 13*  ALT 16* 19 12*  ALKPHOS 65 80 86  BILITOT 0.5 0.4 0.9    SPEP: no M spike Serum free light chain Kappa/Lamda ratio 13.04 UPEP  Free light chain Kappa/lamda ratio 162.43, M spike 168m/24 hour   Bone marrow biopsy 03/21/2017  Bone Marrow, Aspirate,Biopsy, and Clot, left iliac - HYPERCELLULAR BONE MARROW FOR AGE WITH TRILINEAGE HEMATOPOIESIS. - PLASMACYTOSIS (PLASMA CELLS 8%)  Karyotype: loss of Y chromosome Cytogenetic MDS FISH Panel negative.   Bone marrow 04/06/2017  Bone Marrow, Aspirate,Biopsy, and Clot, right iliac and core - HYPERCELLULAR BONE MARROW FOR AGE WITH PLASMA CELL NEOPLASM - TRILINEAGE HEMATOPOIESIS. - SEE COMMENT. PERIPHERAL BLOOD: - MICROCYTIC-HYPOCHROMIC ANEMIA. Diagnosis Note The bone marrow is hypercellular with trilineage hematopoiesis but with relative abundance of erythroid precursors and increased number of megakaryocytes with nonspecific changes. Significant dyspoiesis is not seen. Iron stores are present with no ring sideroblasts. The plasma cells are increased in number representing 8% of all cells in the aspirate although focal areas in the core biopsy show 10 to 20% as primarily seen by CD138 stain. In situ hybridization for kappa and lambda light chains show kappa light chain restriction consistent  with plasma cell neoplasm. Correlation with cytogenetic and FISH studies is recommended. (BNS:ecj/gt 04/07/2017)  IMAGE STUDIES I have personally reviewed below image results.  03/15/2017 DG bone survey Met: Nonspecific lucent lesion in the distal right ulna measuring 15-16 mm, but otherwise normal bone mineralization for age throughout the visible skeleton. A solitary lytic lesion in a distal extremity would be an unusual presentation of multiple myeloma, and I favor the distal right ulna lesion is benign. Recommend correlation with serum and urine protein electrophoresis.  04/14/2017 PET scan: 1. 2.1 cm hypermetabolic soft tissue lesion in the left heel, adjacent to the calcaneal tuberosity. 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 2. Mottled FDG accumulation diffusely in the marrow space without other frankly overt hypermetabolic bony lesion. 3. Coronary artery and thoracoabdominal aortic atherosclerosis with abdominal aortic stent graft visualized in situ. 4. Bilateral renal cysts and bilateral nonobstructing renal stones.  08/24/2017 PET scan 1. No hypermetabolic osseous lesions. No definite signs of multiple myeloma on CT imaging. 2. Interval improvement in soft tissue activity along the medial aspect of the left calcaneal tuberosity, likely plantar fasciitis. 3. New small left pleural effusion. 4. Bilateral inguinal hernias containing  small bowel on the right and sigmoid colon on the left. No evidence of incarceration or obstruction. 5. Otherwise stable incidental findings including diffuse atherosclerosis, bilateral renal cysts, emphysema and sigmoid diverticulosis.  ASSESSMENT & PLAN:  1. Multiple myeloma, remission status unspecified ([place])   2. Iron deficiency anemia, unspecified iron deficiency anemia type   3. CKD (chronic kidney disease) stage 3, GFR 30-59 ml/min (HCC)    # Continue monitoring MM disease.  Plan repeat myeloma labs in 3 months.   # Iron deficiency anemia: s/p  multiple IV Venofer, and ablation of GI AVMs. Hemoglobin improved, today Hb is 14.6  # chronic renal insufficiency stage 3,  Kidney function flucturates.  follow-up with [facility] nephrology # continue Aspirin 19m and Plavix, close monitor platelet coutns.  # Xgeva or zometa once he obtained dental clearance. He has not obtained dental clearance yet.   Follow up in 2 months repeat cbc.,cmp and MM labs.  All questions were answered. The patient knows to call the clinic with any problems questions or concerns.  ZEarlie Server MD, PhD Hematology Oncology CThird [facility] Center LPat AMclaren Bay RegionalPager- [phone]/29/2019

## 2017-10-10 ENCOUNTER — Ambulatory Visit: Payer: Medicare Other | Admitting: Podiatry

## 2017-10-10 ENCOUNTER — Encounter: Payer: Self-pay | Admitting: Podiatry

## 2017-10-10 DIAGNOSIS — M79609 Pain in unspecified limb: Secondary | ICD-10-CM | POA: Diagnosis not present

## 2017-10-10 DIAGNOSIS — B351 Tinea unguium: Secondary | ICD-10-CM

## 2017-10-10 DIAGNOSIS — I739 Peripheral vascular disease, unspecified: Secondary | ICD-10-CM

## 2017-10-10 DIAGNOSIS — E1142 Type 2 diabetes mellitus with diabetic polyneuropathy: Secondary | ICD-10-CM

## 2017-10-10 DIAGNOSIS — D689 Coagulation defect, unspecified: Secondary | ICD-10-CM

## 2017-10-10 NOTE — Progress Notes (Signed)
Complaint:  Visit Type: Patient returns to my office for continued preventative foot care services. Complaint: Patient states" my nails have grown long and thick and become painful to walk and wear shoes" Patient has been diagnosed with DM with pvd and neuropathy.. The patient presents for preventative foot care services. No changes to ROS.  Patient has nail trauma left hallux..  Podiatric Exam: Vascular: dorsalis pedis are palpable  B/L. and posterior tibial pulses are not  palpable bilateral. Capillary return is immediate. Temperature gradient is WNL. Skin turgor WNL  Sensorium: Diminished  Semmes Weinstein monofilament test.  Nail Exam: Pt has thick disfigured discolored nails with subungual debris noted bilateral entire nail hallux through fifth toenails.  Absent second toenail right foot.  Nail injurt left hallux with no infection or drainage. Ulcer Exam: There is no evidence of ulcer or pre-ulcerative changes or infection. Orthopedic Exam: Muscle tone and strength are WNL. No limitations in general ROM. No crepitus or effusions noted. Foot type and digits show no abnormalities. Bony prominences are unremarkable. HAV  B/L and hammer toe  B/L.  Pes planus. Skin: No Porokeratosis. No infection or ulcers  Diagnosis:  Onychomycosis, , Pain in right toe, pain in left toes  Treatment & Plan Procedures and Treatment: Consent by patient was obtained for treatment procedures. The patient understood the discussion of treatment and procedures well. All questions were answered thoroughly reviewed. Debridement of mycotic and hypertrophic toenails, 1 through 5 bilateral and clearing of subungual debris. No ulceration, no infection noted.  Return Visit-Office Procedure: Patient instructed to return to the office for a follow up visit 3 months for continued evaluation and treatment.    Gardiner Barefoot DPM

## 2017-11-01 ENCOUNTER — Ambulatory Visit (INDEPENDENT_AMBULATORY_CARE_PROVIDER_SITE_OTHER): Payer: Medicare Other | Admitting: Vascular Surgery

## 2017-11-01 ENCOUNTER — Encounter (INDEPENDENT_AMBULATORY_CARE_PROVIDER_SITE_OTHER): Payer: Medicare Other

## 2017-11-29 ENCOUNTER — Other Ambulatory Visit (INDEPENDENT_AMBULATORY_CARE_PROVIDER_SITE_OTHER): Payer: Medicare Other

## 2017-11-29 ENCOUNTER — Ambulatory Visit (INDEPENDENT_AMBULATORY_CARE_PROVIDER_SITE_OTHER): Payer: Medicare Other | Admitting: Vascular Surgery

## 2017-11-29 ENCOUNTER — Encounter (INDEPENDENT_AMBULATORY_CARE_PROVIDER_SITE_OTHER): Payer: Medicare Other

## 2017-12-02 ENCOUNTER — Inpatient Hospital Stay: Payer: Medicare Other | Attending: Oncology

## 2017-12-02 DIAGNOSIS — C9000 Multiple myeloma not having achieved remission: Secondary | ICD-10-CM | POA: Diagnosis present

## 2017-12-02 DIAGNOSIS — D509 Iron deficiency anemia, unspecified: Secondary | ICD-10-CM

## 2017-12-02 LAB — IRON AND TIBC
Iron: 35 ug/dL — ABNORMAL LOW (ref 45–182)
SATURATION RATIOS: 13 % — AB (ref 17.9–39.5)
TIBC: 277 ug/dL (ref 250–450)
UIBC: 242 ug/dL

## 2017-12-02 LAB — FERRITIN: FERRITIN: 57 ng/mL (ref 24–336)

## 2017-12-04 LAB — MULTIPLE MYELOMA PANEL, SERUM
ALBUMIN/GLOB SERPL: 0.9 (ref 0.7–1.7)
Albumin SerPl Elph-Mcnc: 3.2 g/dL (ref 2.9–4.4)
Alpha 1: 0.3 g/dL (ref 0.0–0.4)
Alpha2 Glob SerPl Elph-Mcnc: 0.9 g/dL (ref 0.4–1.0)
B-Globulin SerPl Elph-Mcnc: 0.9 g/dL (ref 0.7–1.3)
Gamma Glob SerPl Elph-Mcnc: 1.6 g/dL (ref 0.4–1.8)
Globulin, Total: 3.8 g/dL (ref 2.2–3.9)
IGA: 210 mg/dL (ref 61–437)
IGM (IMMUNOGLOBULIN M), SRM: 90 mg/dL (ref 15–143)
IgG (Immunoglobin G), Serum: 1831 mg/dL — ABNORMAL HIGH (ref 700–1600)
TOTAL PROTEIN ELP: 7 g/dL (ref 6.0–8.5)

## 2017-12-09 ENCOUNTER — Ambulatory Visit (INDEPENDENT_AMBULATORY_CARE_PROVIDER_SITE_OTHER): Payer: Medicare Other | Admitting: Vascular Surgery

## 2017-12-09 ENCOUNTER — Inpatient Hospital Stay: Payer: Medicare Other | Admitting: Oncology

## 2017-12-09 ENCOUNTER — Encounter (INDEPENDENT_AMBULATORY_CARE_PROVIDER_SITE_OTHER): Payer: Medicare Other

## 2017-12-12 ENCOUNTER — Inpatient Hospital Stay: Payer: Medicare Other

## 2017-12-12 ENCOUNTER — Encounter: Payer: Self-pay | Admitting: Oncology

## 2017-12-12 ENCOUNTER — Inpatient Hospital Stay: Payer: Medicare Other | Attending: Oncology | Admitting: Oncology

## 2017-12-12 VITALS — BP 107/71 | HR 76 | Temp 97.5°F | Resp 18 | Wt 193.0 lb

## 2017-12-12 DIAGNOSIS — N183 Chronic kidney disease, stage 3 unspecified: Secondary | ICD-10-CM

## 2017-12-12 DIAGNOSIS — D509 Iron deficiency anemia, unspecified: Secondary | ICD-10-CM | POA: Insufficient documentation

## 2017-12-12 DIAGNOSIS — D631 Anemia in chronic kidney disease: Secondary | ICD-10-CM | POA: Diagnosis not present

## 2017-12-12 DIAGNOSIS — C9000 Multiple myeloma not having achieved remission: Secondary | ICD-10-CM

## 2017-12-12 DIAGNOSIS — F17210 Nicotine dependence, cigarettes, uncomplicated: Secondary | ICD-10-CM | POA: Diagnosis not present

## 2017-12-12 DIAGNOSIS — E8809 Other disorders of plasma-protein metabolism, not elsewhere classified: Secondary | ICD-10-CM | POA: Insufficient documentation

## 2017-12-12 DIAGNOSIS — M069 Rheumatoid arthritis, unspecified: Secondary | ICD-10-CM | POA: Insufficient documentation

## 2017-12-12 LAB — COMPREHENSIVE METABOLIC PANEL
ALK PHOS: 89 U/L (ref 38–126)
ALT: 21 U/L (ref 0–44)
ANION GAP: 7 (ref 5–15)
AST: 22 U/L (ref 15–41)
Albumin: 3.7 g/dL (ref 3.5–5.0)
BUN: 18 mg/dL (ref 8–23)
CALCIUM: 9.6 mg/dL (ref 8.9–10.3)
CO2: 23 mmol/L (ref 22–32)
Chloride: 108 mmol/L (ref 98–111)
Creatinine, Ser: 1.27 mg/dL — ABNORMAL HIGH (ref 0.61–1.24)
GFR calc non Af Amer: 54 mL/min — ABNORMAL LOW (ref >60)
Glucose, Bld: 103 mg/dL — ABNORMAL HIGH (ref 70–99)
Potassium: 4.2 mmol/L (ref 3.5–5.1)
SODIUM: 138 mmol/L (ref 135–145)
TOTAL PROTEIN: 8.1 g/dL (ref 6.5–8.1)
Total Bilirubin: 0.3 mg/dL (ref 0.3–1.2)

## 2017-12-12 LAB — CBC WITH DIFFERENTIAL/PLATELET
Basophils Absolute: 0 10*3/uL (ref 0–0.1)
Basophils Relative: 1 %
EOS PCT: 3 %
Eosinophils Absolute: 0.2 10*3/uL (ref 0–0.7)
HCT: 41.9 % (ref 40.0–52.0)
HEMOGLOBIN: 13.8 g/dL (ref 13.0–18.0)
LYMPHS PCT: 16 %
Lymphs Abs: 1.3 10*3/uL (ref 1.0–3.6)
MCH: 27.3 pg (ref 26.0–34.0)
MCHC: 33 g/dL (ref 32.0–36.0)
MCV: 82.7 fL (ref 80.0–100.0)
MONO ABS: 0.5 10*3/uL (ref 0.2–1.0)
Monocytes Relative: 7 %
NEUTROS PCT: 73 %
Neutro Abs: 5.8 10*3/uL (ref 1.4–6.5)
Platelets: 232 10*3/uL (ref 150–440)
RBC: 5.07 MIL/uL (ref 4.40–5.90)
RDW: 22.4 % — ABNORMAL HIGH (ref 11.5–14.5)
WBC: 7.9 10*3/uL (ref 3.8–10.6)

## 2017-12-12 NOTE — Progress Notes (Signed)
Pt in for follow up, States "feeling ok".  Pt denies any concerns or difficulties today.

## 2017-12-12 NOTE — Progress Notes (Signed)
Hematology/Oncology Follow Up Note [facility] [phone] [phone]) [phone]  Patient Care Team: Jodi Marble, MD as PCP - General (Internal Medicine) Bary Castilla, Forest Gleason, MD (General Surgery) Edrick Kins, MD as Rounding Team (Internal Medicine) Hillary Bow, MD as Consulting Physician (Internal Medicine) [name], MD as Surgeon (Gastroenterology) Isaias Cowman, MD as Consulting Physician (Cardiology)  REFERRING PROVIDER: Dr.Lateef, [place]  REASON FOR VISIT Follow up for management of plasma cell dyscrasia.   HISTORY OF PRESENTING ILLNESS:  77 y.o.  male with PMH listed below who presents to follow up on the evaluation and management of his abnormal urine protein electrophoresis results. I reviewed the records from [facility], [place] and [place] was performed and HemOnc related medical problems are listed below.  He lives with a friend. He has 3 adult children.   1 Chronic Kidney disease, Stage III: patient follows up with Dr.Lateef.  2 Proteinuria:  02/18/2017: Albumin/Creatinine ratio was 7, Urine protein electrophoresis,Random Urine revealed M Spike 43.2%, total protein of 43.'7mg'$ /dl,  01/05/2017 Albumin/Creatinine ratio was 455.2, Albumin 996.5, , 2.15.2018 Albumin/Creatinine ratio was 80.3, Albumin 73.3, Urine protein electrophoresis,Random Urine revealed M Spike 29.7%, total protein of 29.'1mg'$ /dl, 06/30/2016 Serum protein electrophoresis did not detect M spike.  Autoimmune disorder work up showed Negative anti dsDNA, RNP antibodies, smith antibody, Sjogren antibodies, anti Jo-1, positive antichromotin antibodies, positive ANA,  3 Anemia of chronic kidney disease:  4 Iron deficiency anemia: history of iron deficiency, ferritin was 7 on 08/11/2016. He had EGD and colonoscopy that were done this year which showed duodenitis/esphagitis/diverticulosis/benigh polyps. 5 Rheumatoid  arthritis: he follows up with Dr.Kernodle and is on chronic MTX. He Is currently off MTX reports joint pains are controlled, not quite symptomatic.   Patient denies any persistent bone pain or any pain. He continue to feel lack of energy, persistent, not improved with resting or taking naps. He also has lost 25 pounds in the past year which was unintentional.   # Rheumatoid arthritis and currently is off MTX.  # It is ambiguous whether he has truly symptomatic multiple myeloma or a smoldering myeloma, given that anemia can be secondary to iron deficiency and CKD.  The hypermetabolic 2.1 cm hypermetabolic soft tissue lesion in the left heel, can reflect a Plasmacytoma,which should have been biopsied. However patient initially denies being on any blood thinner, later he was found to be on Plavix with pre biopsy questionnaire screening, and biopsy was held. Patient has to contact cardiologist to hold plavix for seven days before procedure. He feels frustrated about multiple workup and meanwhile he becomes more fatigues, with worsening of kidney function. Patient was reluctant about proceeding additional invasive procedures and wants to start treatment. Decision was made to start active MM treatment.   # He was evaluated by [facility] bone marrow transplant team for autologous bone marrow transplant. He is not considered to  good candidate for transplant. Patient cardiologist Dr. Chancy Milroy had started patient on Lasix 20 mg daily. Lasix was discontinued by [facility] bone marrow transplant team as patient was having dizziness and borderline blood pressure during his clinic visit there.   Current Treatment:  S/p RVD x 4 cycles tolerates well except cytopenia.  Revlimid 10 mg PO once per day on days 1 to 14 (dose reduced due to GFR) Revlimid renal dosing of '10mg'$ ,  Velcade 1.3 mg/m2 IV once per day on days 1,  8, 15 Dexamethasone 20 mg PO once per day on days 1, 8,  15 given patient's diabetes Given patient's multiple  comorbidity and the side effects of cytopenia during his RVD treatment and the uncertainty of this is really a site of plasmacytoma or not, I recommend not to continue him on maintenance Revlimid.   INTERVAL HISTORY Patient presents for follow-up of management of plasma cell dyscrasia. He is not currently on any treatment.  Status post IV iron infusion.  Reports feeling a lot better since his iron panel is replete. He has had small bowel AVMs ablated at [facility] as well. Shortness of breath with exertion, chronic Denies fever chills, night sweat. Appetite is fair, reports appetite is not as good as previously. Food does not taste good.      Review of Systems  Constitutional: Negative for appetite change, chills, diaphoresis, fatigue, fever and unexpected weight change.  HENT:   Negative for hearing loss, lump/mass, mouth sores, nosebleeds, sore throat and tinnitus.   Eyes: Negative for eye problems and icterus.  Respiratory: Negative for chest tightness, cough, hemoptysis, shortness of breath and wheezing.        SOB with exertion..  Cardiovascular: Negative for chest pain and leg swelling.  Gastrointestinal: Negative for abdominal distention, abdominal pain, blood in stool, constipation, diarrhea, nausea and rectal pain.  Endocrine: Negative for hot flashes.  Genitourinary: Negative for bladder incontinence, difficulty urinating, dysuria, frequency, hematuria and nocturia.   Musculoskeletal: Negative for arthralgias, back pain, flank pain, gait problem and myalgias.  Skin: Negative for itching and rash.  Neurological: Positive for numbness. Negative for dizziness, gait problem, headaches and seizures.  Hematological: Negative for adenopathy. Does not bruise/bleed easily.  Psychiatric/Behavioral: Negative for confusion, decreased concentration and depression. The patient is not nervous/anxious.      MEDICAL HISTORY:  Past Medical History:  Diagnosis Date  . Anxiety   . Arthritis     rheumatoid  . Atrial fibrillation (HCC)    hx of  . Cancer ([place])   . Chronic kidney disease   . Colon polyp 07-07-15   TUBULAR ADENOMA WITH AT LEAST HIGH-GRADE / Dr Rayann Heman  . Diabetes mellitus without complication ([place])   . Dysrhythmia    bradycardia...Marland KitchenMarland Kitchen2 to 1 heart block  . GERD (gastroesophageal reflux disease)   . Heart murmur    patient unaware of history of murmur  . Hypercholesterolemia   . Hypertension   . Presence of permanent cardiac pacemaker   . Prostate cancer ([place]) 01/01/13, 01/30/14   Gleason 3+4=7, volume 46.6 cc  . Rheumatoid arthritis ([place])   . S/P radiation therapy  04/03/2014 through 06/04/2014                                                      Prostate 7800 cGy in 40 sessions                          . Sleep apnea    uses cpap but it is not in the hospital with him    SURGICAL HISTORY: Past Surgical History:  Procedure Laterality Date  . ABDOMINAL AORTIC ANEURYSM REPAIR  11/2013  . COLON SURGERY  March 2017   Right hemicolectomy for tubulovillous adenoma with high-grade dysplasia.  . COLONOSCOPY WITH PROPOFOL N/A 07/07/2015   Procedure: COLONOSCOPY WITH PROPOFOL;  Surgeon: Josefine Class, MD;  Location: [facility] ENDOSCOPY;  Service:  Endoscopy;  Laterality: N/A;  . COLONOSCOPY WITH PROPOFOL N/A 10/28/2016   Procedure: COLONOSCOPY WITH PROPOFOL;  Surgeon: [name], MD;  Location: [facility] ENDOSCOPY;  Service: Endoscopy;  Laterality: N/A;  . ESOPHAGOGASTRODUODENOSCOPY (EGD) WITH PROPOFOL N/A 10/28/2016   Procedure: ESOPHAGOGASTRODUODENOSCOPY (EGD) WITH PROPOFOL;  Surgeon: [name], MD;  Location: [facility] ENDOSCOPY;  Service: Endoscopy;  Laterality: N/A;  . GIVENS CAPSULE STUDY N/A 07/25/2017   Procedure: GIVENS CAPSULE STUDY;  Surgeon: [name], MD;  Location: [facility] ENDOSCOPY;  Service: Gastroenterology;  Laterality: N/A;  . LAPAROSCOPIC RIGHT COLECTOMY Right 08/08/2015   Procedure: LAPAROSCOPIC RIGHT COLECTOMY;  Surgeon: Robert Bellow, MD;  Location: [facility] ORS;   Service: General;  Laterality: Right;  . NASAL SINUS SURGERY    . PACEMAKER INSERTION Left 08/26/2016   Procedure: INSERTION PACEMAKER;  Surgeon: Isaias Cowman, MD;  Location: [facility] ORS;  Service: Cardiovascular;  Laterality: Left;  . PROSTATE BIOPSY  01/01/13, 01/30/14   Gleason 3+3=6, vol 46.6 cc  . TONSILLECTOMY    . uvula surgery     for sleep apnea    SOCIAL HISTORY: Social History   Socioeconomic History  . Marital status: Divorced    Spouse name: Not on file  . Number of children: Not on file  . Years of education: Not on file  . Highest education level: Not on file  Occupational History  . Not on file  Social Needs  . Financial resource strain: Not on file  . Food insecurity:    Worry: Not on file    Inability: Not on file  . Transportation needs:    Medical: Not on file    Non-medical: Not on file  Tobacco Use  . Smoking status: Current Every Day Smoker    Packs/day: 0.50    Years: 50.00    Pack years: 25.00    Types: Cigarettes  . Smokeless tobacco: Never Used  . Tobacco comment: DOWN TO 1/2 PPD  Substance and Sexual Activity  . Alcohol use: Yes    Alcohol/week: 0.6 oz    Types: 1 Cans of beer per week    Comment: occasionally  . Drug use: No  . Sexual activity: Not Currently  Lifestyle  . Physical activity:    Days per week: Not on file    Minutes per session: Not on file  . Stress: Not on file  Relationships  . Social connections:    Talks on phone: Not on file    Gets together: Not on file    Attends religious service: Not on file    Active member of club or organization: Not on file    Attends meetings of clubs or organizations: Not on file    Relationship status: Not on file  . Intimate partner violence:    Fear of current or ex partner: Not on file    Emotionally abused: Not on file    Physically abused: Not on file    Forced sexual activity: Not on file  Other Topics Concern  . Not on file  Social History Narrative  . Not on file     FAMILY HISTORY: Family History  Problem Relation Age of Onset  . Heart attack Mother   . Cirrhosis Father   . Pancreatic cancer Brother   . Diabetes Brother   . Diabetes Daughter        medication induced for cancer treatments  . Cancer Daughter        breast, brain    ALLERGIES:  has No  Known Allergies.  MEDICATIONS:  Current Outpatient Medications  Medication Sig Dispense Refill  . acetaminophen (TYLENOL) 325 MG tablet Take 325 mg by mouth every 6 (six) hours as needed.    . ALPRAZolam (XANAX) 0.25 MG tablet Take by mouth.    Marland Kitchen amLODipine-benazepril (LOTREL) 5-20 MG capsule Take 1 capsule by mouth daily.    Marland Kitchen aspirin 81 MG chewable tablet Chew 81 mg by mouth daily.    Marland Kitchen atorvastatin (LIPITOR) 80 MG tablet Take 80 mg by mouth at bedtime.     . colchicine 0.6 MG tablet     . fexofenadine (ALLEGRA) 180 MG tablet Take 180 mg by mouth daily.    . fluticasone (FLONASE) 50 MCG/ACT nasal spray Place 2 sprays into both nostrils daily as needed for rhinitis.     . Fluticasone Furoate-Vilanterol (BREO ELLIPTA) 100-25 MCG/INH AEPB Inhale 1 puff into the lungs daily.     Marland Kitchen gabapentin (NEURONTIN) 300 MG capsule Take 300 mg by mouth 3 (three) times daily.  2  . isosorbide mononitrate (IMDUR) 30 MG 24 hr tablet Take 30 mg by mouth daily.    . metFORMIN (GLUCOPHAGE) 500 MG tablet Take 500 mg by mouth daily with breakfast.     . metoprolol succinate (TOPROL-XL) 50 MG 24 hr tablet Take 50 mg by mouth daily. Take with or immediately following a meal.    . pantoprazole (PROTONIX) 40 MG tablet Take 1 tablet (40 mg total) by mouth 2 (two) times daily. 60 tablet 3  . triamcinolone cream (KENALOG) 0.1 % Apply 1 application topically 2 (two) times daily.    . Butalbital-APAP-Caffeine (FIORICET) 50-300-40 MG CAPS Take 1 capsule every four to six hours as needed    . clopidogrel (PLAVIX) 75 MG tablet Take by mouth.    . folic acid (FOLVITE) 1 MG tablet Take by mouth.    . furosemide (LASIX) 20 MG  tablet Take 20 mg by mouth daily.    Marland Kitchen loperamide (IMODIUM) 2 MG capsule Take 1 capsule (2 mg total) by mouth See admin instructions. With onset of diarrhea, take 103m followed by 254mevery 2 hours resolved. Maximum: 16 mg/day (Patient not taking: Reported on 12/12/2017) 120 capsule 0  . ondansetron (ZOFRAN) 8 MG tablet Take 1 tablet (8 mg total) 2 (two) times daily as needed by mouth (Nausea or vomiting). (Patient not taking: Reported on 12/12/2017) 30 tablet 1   No current facility-administered medications for this visit.       . Marland KitchenPHYSICAL EXAMINATION: ECOG PERFORMANCE STATUS: 1 - Symptomatic but completely ambulatory Vitals:   12/12/17 1522  BP: 107/71  Pulse: 76  Resp: 18  Temp: (!) 97.5 F (36.4 C)   Filed Weights   12/12/17 1522  Weight: 193 lb (87.5 kg)   Physical Exam  Constitutional: He is oriented to person, place, and time and well-developed, well-nourished, and in no distress. No distress.  HENT:  Head: Normocephalic and atraumatic.  Nose: Nose normal.  Mouth/Throat: Oropharynx is clear and moist. No oropharyngeal exudate.  Eyes: Pupils are equal, round, and reactive to light. EOM are normal. Right eye exhibits no discharge. Left eye exhibits no discharge. No scleral icterus.  Neck: Normal range of motion. Neck supple. No JVD present.  Cardiovascular: Normal rate, regular rhythm and normal heart sounds. Exam reveals no friction rub.  No murmur heard. Pulmonary/Chest: Effort normal and breath sounds normal. No respiratory distress. He has no wheezes. He has no rales. He exhibits no tenderness.  Abdominal: Soft. Bowel  sounds are normal. He exhibits no distension and no mass. There is no tenderness. There is no rebound.  Musculoskeletal: Normal range of motion. He exhibits no edema or tenderness.  Lymphadenopathy:    He has no cervical adenopathy.  Neurological: He is alert and oriented to person, place, and time. No cranial nerve deficit. He exhibits normal muscle tone.  Coordination normal.  Skin: Skin is warm and dry. No rash noted. He is not diaphoretic. No erythema.  Psychiatric: Affect and judgment normal.    LABORATORY DATA:  I have reviewed the data as listed Lab Results  Component Value Date   WBC 7.9 12/12/2017   HGB 13.8 12/12/2017   HCT 41.9 12/12/2017   MCV 82.7 12/12/2017   PLT 232 12/12/2017   Recent Labs    08/26/17 1145 10/03/17 1035 12/12/17 1600  NA 138 139 138  K 4.0 4.4 4.2  CL 109 107 108  CO2 21* 23 23  GLUCOSE 105* 120* 103*  BUN _0 CREATININE 1.14 1.35* 1.27*  CALCIUM 8.9 9.5 9.6  GFRNONAA >60 50* 54*  GFRAA >60 58* >60  PROT 7.3 8.7* 8.1  ALBUMIN 3.7 3.9 3.7  AST 13* 32 22  ALT 12* 38 21  ALKPHOS 86 122 89  BILITOT 0.9 0.4 0.3    SPEP: no M spike Serum free light chain Kappa/Lamda ratio 13.04 UPEP  Free light chain Kappa/lamda ratio 162.43, M spike 126m/24 hour   Bone marrow biopsy 03/21/2017  Bone Marrow, Aspirate,Biopsy, and Clot, left iliac - HYPERCELLULAR BONE MARROW FOR AGE WITH TRILINEAGE HEMATOPOIESIS. - PLASMACYTOSIS (PLASMA CELLS 8%)  Karyotype: loss of Y chromosome Cytogenetic MDS FISH Panel negative.   Bone marrow 04/06/2017  Bone Marrow, Aspirate,Biopsy, and Clot, right iliac and core - HYPERCELLULAR BONE MARROW FOR AGE WITH PLASMA CELL NEOPLASM - TRILINEAGE HEMATOPOIESIS. - SEE COMMENT. PERIPHERAL BLOOD: - MICROCYTIC-HYPOCHROMIC ANEMIA. Diagnosis Note The bone marrow is hypercellular with trilineage hematopoiesis but with relative abundance of erythroid precursors and increased number of megakaryocytes with nonspecific changes. Significant dyspoiesis is not seen. Iron stores are present with no ring sideroblasts. The plasma cells are increased in number representing 8% of all cells in the aspirate although focal areas in the core biopsy show 10 to 20% as primarily seen by CD138 stain. In situ hybridization for kappa and lambda light chains show kappa light chain restriction  consistent with plasma cell neoplasm. Correlation with cytogenetic and FISH studies is recommended. (BNS:ecj/gt 04/07/2017)  IMAGE STUDIES I have personally reviewed below image results.  03/15/2017 DG bone survey Met: Nonspecific lucent lesion in the distal right ulna measuring 15-16 mm, but otherwise normal bone mineralization for age throughout the visible skeleton. A solitary lytic lesion in a distal extremity would be an unusual presentation of multiple myeloma, and I favor the distal right ulna lesion is benign. Recommend correlation with serum and urine protein electrophoresis.  04/14/2017 PET scan: 1. 2.1 cm hypermetabolic soft tissue lesion in the left heel, adjacent to the calcaneal tuberosity. 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 2. Mottled FDG accumulation diffusely in the marrow space without other frankly overt hypermetabolic bony lesion. 3. Coronary artery and thoracoabdominal aortic atherosclerosis with abdominal aortic stent graft visualized in situ. 4. Bilateral renal cysts and bilateral nonobstructing renal stones.  08/24/2017 PET scan 1. No hypermetabolic osseous lesions. No definite signs of multiple myeloma on CT imaging. 2. Interval improvement in soft tissue activity along the medial aspect of the left calcaneal tuberosity, likely plantar fasciitis. 3.  New small left pleural effusion. 4. Bilateral inguinal hernias containing small bowel on the right and sigmoid colon on the left. No evidence of incarceration or obstruction. 5. Otherwise stable incidental findings including diffuse atherosclerosis, bilateral renal cysts, emphysema and sigmoid diverticulosis.  ASSESSMENT & PLAN:  1. Plasma cell dyscrasia   2. Iron deficiency anemia, unspecified iron deficiency anemia type   3. CKD (chronic kidney disease) stage 3, GFR 30-59 ml/min (HCC)    # Plasma cell dyscrasia. He at least have smoldering multiple myeloma, or active myeloma if the hypermetabolic lesion on PET is   Related to MM. Previous discussed about hold maintenance treatment due to his performance status and likely early stage of his MM   discussed with the patient about the repeating bone marrow biopsy.  Patient prefer to wait until all his labs coming back and then decide, may be at next visit.   Multiple myeloma discussed with patient.  Labs reviewed.  no M spike observed no M spike at baseline].  Free light chain ratio pending Hemoglobin remained stable. Continue monitoring MM disease.  Plan repeat myeloma labs in 3 months.  . # Iron deficiency anemia: s/p multiple IV Venofer, and ablation of GI AVMs.  Hemoglobin remained stable, at 13.8 today. # chronic renal insufficiency stage 3,  Kidney function flucturates.  Follow-up with nephrology.   # continue Aspirin 41m and Plavix, close monitor platelet coutns.  # Xgeva or zometa once he obtained dental clearance. He has not obtained dental clearance yet.   Follow up in 2 months repeat cbc.,cmp and MM labs.  All questions were answered. The patient knows to call the clinic with any problems questions or concerns. Total face to face encounter time for this patient visit was 25 min. >50% of the time was  spent in counseling and coordination of care.  ZEarlie Server MD, PhD Hematology Oncology CSpalding Endoscopy Center LLCat AAscension St Clares HospitalPager- [phone]/01/2018

## 2017-12-14 LAB — KAPPA/LAMBDA LIGHT CHAINS
Kappa free light chain: 117.4 mg/L — ABNORMAL HIGH (ref 3.3–19.4)
Kappa, lambda light chain ratio: 4.85 — ABNORMAL HIGH (ref 0.26–1.65)
LAMDA FREE LIGHT CHAINS: 24.2 mg/L (ref 5.7–26.3)

## 2017-12-23 ENCOUNTER — Ambulatory Visit (INDEPENDENT_AMBULATORY_CARE_PROVIDER_SITE_OTHER): Payer: Medicare Other | Admitting: Vascular Surgery

## 2017-12-23 ENCOUNTER — Encounter

## 2017-12-23 ENCOUNTER — Encounter (INDEPENDENT_AMBULATORY_CARE_PROVIDER_SITE_OTHER): Payer: Self-pay | Admitting: Vascular Surgery

## 2017-12-23 ENCOUNTER — Ambulatory Visit (INDEPENDENT_AMBULATORY_CARE_PROVIDER_SITE_OTHER): Payer: Medicare Other

## 2017-12-23 VITALS — BP 120/77 | HR 86 | Resp 14 | Ht 73.0 in | Wt 192.0 lb

## 2017-12-23 DIAGNOSIS — I10 Essential (primary) hypertension: Secondary | ICD-10-CM

## 2017-12-23 DIAGNOSIS — I714 Abdominal aortic aneurysm, without rupture, unspecified: Secondary | ICD-10-CM

## 2017-12-23 DIAGNOSIS — F17200 Nicotine dependence, unspecified, uncomplicated: Secondary | ICD-10-CM

## 2017-12-23 DIAGNOSIS — E785 Hyperlipidemia, unspecified: Secondary | ICD-10-CM

## 2017-12-23 DIAGNOSIS — I739 Peripheral vascular disease, unspecified: Secondary | ICD-10-CM | POA: Diagnosis not present

## 2017-12-23 NOTE — Progress Notes (Signed)
MRN : [id number]  Ontario Pettengill is a 77 y.o. (09-24-1940) male who presents with chief complaint of  Chief Complaint  Patient presents with  . Follow-up    1 year f/u  .  History of Present Illness: Patient returns today in follow up of his aneurysm and leg pain.  He was started on some Neurontin which has helped but not completely resolved his leg pain.  No ulceration or infection.  No aneurysm related symptoms.  He is now 3 years status post endovascular aneurysm repair.  His duplex today shows significant decrease in the aortic sac size now measuring 3.7 cm in maximal diameter without an endoleak.  His ABIs were 1.04 on the right and 0.98 on the left with normal waveforms.  Current Outpatient Medications  Medication Sig Dispense Refill  . acetaminophen (TYLENOL) 325 MG tablet Take 325 mg by mouth every 6 (six) hours as needed.    . ALPRAZolam (XANAX) 0.25 MG tablet Take by mouth.    Marland Kitchen amLODipine-benazepril (LOTREL) 5-20 MG capsule Take 1 capsule by mouth daily.    Marland Kitchen aspirin 81 MG chewable tablet Chew 81 mg by mouth daily.    Marland Kitchen atorvastatin (LIPITOR) 80 MG tablet Take 80 mg by mouth at bedtime.     . Butalbital-APAP-Caffeine (FIORICET) 50-300-40 MG CAPS Take 1 capsule every four to six hours as needed    . Cholecalciferol (D3-50) 50000 units capsule Take by mouth.    . colchicine 0.6 MG tablet     . fexofenadine (ALLEGRA) 180 MG tablet Take 180 mg by mouth daily.    . fluticasone (FLONASE) 50 MCG/ACT nasal spray Place 2 sprays into both nostrils daily as needed for rhinitis.     . Fluticasone Furoate-Vilanterol (BREO ELLIPTA) 100-25 MCG/INH AEPB Inhale 1 puff into the lungs daily.     . folic acid (FOLVITE) 1 MG tablet Take by mouth.    . furosemide (LASIX) 20 MG tablet Take 20 mg by mouth daily.    Marland Kitchen gabapentin (NEURONTIN) 300 MG capsule Take 300 mg by mouth 3 (three) times daily.  2  . isosorbide mononitrate (IMDUR) 30 MG 24 hr tablet Take 30 mg by mouth daily.    Marland Kitchen loperamide  (IMODIUM) 2 MG capsule Take 1 capsule (2 mg total) by mouth See admin instructions. With onset of diarrhea, take 37m followed by 23mevery 2 hours resolved. Maximum: 16 mg/day 120 capsule 0  . metFORMIN (GLUCOPHAGE) 500 MG tablet Take 500 mg by mouth daily with breakfast.     . metoprolol succinate (TOPROL-XL) 50 MG 24 hr tablet Take 50 mg by mouth daily. Take with or immediately following a meal.    . ondansetron (ZOFRAN) 8 MG tablet Take 1 tablet (8 mg total) 2 (two) times daily as needed by mouth (Nausea or vomiting). 30 tablet 1  . pantoprazole (PROTONIX) 40 MG tablet Take 1 tablet (40 mg total) by mouth 2 (two) times daily. 60 tablet 3  . triamcinolone cream (KENALOG) 0.1 % Apply 1 application topically 2 (two) times daily.    . clopidogrel (PLAVIX) 75 MG tablet Take by mouth.     No current facility-administered medications for this visit.     Past Medical History:  Diagnosis Date  . Anxiety   . Arthritis    rheumatoid  . Atrial fibrillation (HCC)    hx of  . Cancer ([facility]  . Chronic kidney disease   . Colon polyp 07-07-15   TUBULAR ADENOMA WITH AT  LEAST HIGH-GRADE / Dr Rayann Heman  . Diabetes mellitus without complication ([place])   . Dysrhythmia    bradycardia...Marland KitchenMarland Kitchen2 to 1 heart block  . GERD (gastroesophageal reflux disease)   . Heart murmur    patient unaware of history of murmur  . Hypercholesterolemia   . Hypertension   . Presence of permanent cardiac pacemaker   . Prostate cancer ([place]) 01/01/13, 01/30/14   Gleason 3+4=7, volume 46.6 cc  . Rheumatoid arthritis ([place])   . S/P radiation therapy  04/03/2014 through 06/04/2014                                                      Prostate 7800 cGy in 40 sessions                          . Sleep apnea    uses cpap but it is not in the hospital with him    Past Surgical History:  Procedure Laterality Date  . ABDOMINAL AORTIC ANEURYSM REPAIR  11/2013  . COLON SURGERY  March 2017   Right hemicolectomy for tubulovillous adenoma with  high-grade dysplasia.  . COLONOSCOPY WITH PROPOFOL N/A 07/07/2015   Procedure: COLONOSCOPY WITH PROPOFOL;  Surgeon: Josefine Class, MD;  Location: [facility] ENDOSCOPY;  Service: Endoscopy;  Laterality: N/A;  . COLONOSCOPY WITH PROPOFOL N/A 10/28/2016   Procedure: COLONOSCOPY WITH PROPOFOL;  Surgeon: [name], MD;  Location: [facility] ENDOSCOPY;  Service: Endoscopy;  Laterality: N/A;  . ESOPHAGOGASTRODUODENOSCOPY (EGD) WITH PROPOFOL N/A 10/28/2016   Procedure: ESOPHAGOGASTRODUODENOSCOPY (EGD) WITH PROPOFOL;  Surgeon: [name], MD;  Location: [facility] ENDOSCOPY;  Service: Endoscopy;  Laterality: N/A;  . GIVENS CAPSULE STUDY N/A 07/25/2017   Procedure: GIVENS CAPSULE STUDY;  Surgeon: [name], MD;  Location: [facility] ENDOSCOPY;  Service: Gastroenterology;  Laterality: N/A;  . LAPAROSCOPIC RIGHT COLECTOMY Right 08/08/2015   Procedure: LAPAROSCOPIC RIGHT COLECTOMY;  Surgeon: Robert Bellow, MD;  Location: [facility] ORS;  Service: General;  Laterality: Right;  . NASAL SINUS SURGERY    . PACEMAKER INSERTION Left 08/26/2016   Procedure: INSERTION PACEMAKER;  Surgeon: Isaias Cowman, MD;  Location: [facility] ORS;  Service: Cardiovascular;  Laterality: Left;  . PROSTATE BIOPSY  01/01/13, 01/30/14   Gleason 3+3=6, vol 46.6 cc  . TONSILLECTOMY    . uvula surgery     for sleep apnea    Social History  Substance Use Topics  . Smoking status: Current Every Day Smoker    Packs/day: 0.50    Years: 50.00    Types: Cigarettes  . Smokeless tobacco: Never Used     Comment: DOWN TO 1/2 PPD  . Alcohol use 0.6 oz/week     1 Cans of beer per week      Comment: occasionally   Family History      Family History  Problem Relation Age of Onset  . Heart attack Mother   . Cirrhosis Father   . Cancer Brother    pancreatic  . Diabetes Brother   . Diabetes Daughter    medication induced for cancer treatments  . Cancer Daughter    breast, brain     No Known  Allergies   REVIEW OF SYSTEMS(Negative unless checked)  Constitutional: _0 Weight loss_1 Fever_2 Chills Cardiac:_3 Chest pain_4 Chest pressure_5 Palpitations _6 Shortness of breath when laying flat _7 Shortness of breath at rest _8 Shortness of  breath with exertion. Vascular: _0 Pain in legs with walking_1 Pain in legsat rest_2 Pain in legs when laying flat _3 Claudication _4 Pain in feet when walking _5 Pain in feet at rest _6 Pain in feet when laying flat _7 History of DVT _8 Phlebitis _9 Swelling in legs _10 Varicose veins _11 Non-healing ulcers Pulmonary: _12 Uses home oxygen _13 Productive cough_14 Hemoptysis _15 Wheeze _16 COPD _17 Asthma Neurologic: _18 Dizziness _19 Blackouts _20 Seizures _21 History of stroke _22 History of TIA_23 Aphasia _24 Temporary blindness_25 Dysphagia _26 Weaknessor numbness in arms _27 Weakness or numbnessin legs Musculoskeletal: _28 Arthritis _29 Joint swelling _30 Joint pain _31 Low back pain Hematologic:_32 Easy bruising_33 Easy bleeding _34 Hypercoagulable state _35 Anemic  Gastrointestinal:_36 Blood in stool_37 Vomiting blood_38 Gastroesophageal reflux/heartburn_39 Abdominal pain Genitourinary: _40 Chronic kidney disease _41 Difficulturination _42 Frequenturination _43 Burning with urination_44 Hematuria Skin: _45 Rashes _46 Ulcers _47 Wounds Psychological: _48 History of anxiety_49 History of major depression.     Physical Examination  BP 120/77 (BP Location: Right Arm, Patient Position: Sitting)   Pulse 86   Resp 14   Ht _50  (1.854 m)   Wt 192 lb (87.1 kg)   BMI 25.33 kg/m  Gen:  WD/WN, NAD.  Appears younger than stated age Head: [place]/AT, No temporalis wasting. Ear/Nose/Throat: Hearing grossly intact, nares w/o erythema or drainage Eyes: Conjunctiva clear. Sclera non-icteric Neck: Supple.  Trachea midline Pulmonary:  Good air movement, no use of accessory muscles.  Cardiac: RRR, no JVD Vascular:  Vessel Right  Left  Radial Palpable Palpable                          PT Palpable  1+ palpable  DP Palpable Palpable   Gastrointestinal: soft, non-tender/non-distended. No increased aortic impulse Musculoskeletal: M/S 5/5 throughout.  No deformity or atrophy.  Neurologic: Sensation grossly intact in extremities.  Symmetrical.  Speech is fluent.  Psychiatric: Judgment intact, Mood & affect appropriate for pt's clinical situation. Dermatologic: No rashes or ulcers noted.  No cellulitis or open wounds.       Labs Recent Results (from the past 2160 hour(s))  Comprehensive metabolic panel     Status: Abnormal   Collection Time: 10/03/17 10:35 AM  Result Value Ref Range   Sodium 139 135 - 145 mmol/L   Potassium 4.4 3.5 - 5.1 mmol/L   Chloride 107 101 - 111 mmol/L   CO2 23 22 - 32 mmol/L   Glucose, Bld 120 (H) 65 - 99 mg/dL   BUN 19 6 - 20 mg/dL   Creatinine, Ser 1.35 (H) 0.61 - 1.24 mg/dL   Calcium 9.5 8.9 - 10.3 mg/dL   Total Protein 8.7 (H) 6.5 - 8.1 g/dL   Albumin 3.9 3.5 - 5.0 g/dL   AST 32 15 - 41 U/L   ALT 38 17 - 63 U/L   Alkaline Phosphatase 122 38 - 126 U/L   Total Bilirubin 0.4 0.3 - 1.2 mg/dL   GFR calc non Af Amer 50 (L) >60 mL/min   GFR calc Af Amer 58 (L) >60 mL/min    Comment: (NOTE) The eGFR has been calculated using the CKD EPI equation. This calculation has not been validated in all clinical situations. eGFR's persistently <60 mL/min signify possible Chronic Kidney Disease.    Anion gap 9 5 - 15    Comment: Performed at [facility], [address] [place], [place] [address]  CBC with Differential     Status: Abnormal   Collection Time: 10/03/17 10:35 AM  Result Value Ref Range   WBC 8.5 3.8 - 10.6 K/uL   RBC 5.58 4.40 - 5.90 MIL/uL   Hemoglobin 14.6 13.0 - 18.0 g/dL   HCT 44.8 40.0 - 52.0 %  MCV 80.3 80.0 - 100.0 fL   MCH 26.1 26.0 - 34.0 pg   MCHC 32.5 32.0 - 36.0 g/dL   RDW 21.5 (H) 11.5 - 14.5 %   Platelets 274 150 - 440 K/uL   Neutrophils  Relative % 75 %   Neutro Abs 6.4 1.4 - 6.5 K/uL   Lymphocytes Relative 13 %   Lymphs Abs 1.1 1.0 - 3.6 K/uL   Monocytes Relative 7 %   Monocytes Absolute 0.6 0.2 - 1.0 K/uL   Eosinophils Relative 4 %   Eosinophils Absolute 0.4 0 - 0.7 K/uL   Basophils Relative 1 %   Basophils Absolute 0.1 0 - 0.1 K/uL    Comment: Performed at [facility], [address] [place], [place] [address]  Ferritin     Status: None   Collection Time: 12/02/17 11:01 AM  Result Value Ref Range   Ferritin 57 24 - 336 ng/mL    Comment: Performed at [facility], [address] [place], [place] [address]  Iron and TIBC     Status: Abnormal   Collection Time: 12/02/17 11:01 AM  Result Value Ref Range   Iron 35 (L) 45 - 182 ug/dL   TIBC 277 250 - 450 ug/dL   Saturation Ratios 13 (L) 17.9 - 39.5 %   UIBC 242 ug/dL    Comment: Performed at [facility], [address] [place], [place] [address]  Multiple Myeloma Panel (SPEP&IFE w/QIG)     Status: Abnormal   Collection Time: 12/02/17 11:02 AM  Result Value Ref Range   IgG (Immunoglobin G), Serum 1,831 (H) 700 - 1,600 mg/dL   IgA 210 61 - 437 mg/dL   IgM (Immunoglobulin M), Srm 90 15 - 143 mg/dL   Total Protein ELP 7.0 6.0 - 8.5 g/dL   Albumin SerPl Elph-Mcnc 3.2 2.9 - 4.4 g/dL   Alpha 1 0.3 0.0 - 0.4 g/dL   Alpha2 Glob SerPl Elph-Mcnc 0.9 0.4 - 1.0 g/dL   B-Globulin SerPl Elph-Mcnc 0.9 0.7 - 1.3 g/dL   Gamma Glob SerPl Elph-Mcnc 1.6 0.4 - 1.8 g/dL   M Protein SerPl Elph-Mcnc Not Observed Not Observed g/dL   Globulin, Total 3.8 2.2 - 3.9 g/dL   Albumin/Glob SerPl 0.9 0.7 - 1.7   IFE 1 Comment     Comment: (NOTE) An apparent polyclonal gammopathy: IgG. Kappa and lambda typing appear increased.    Please Note Comment     Comment: (NOTE) Protein electrophoresis scan will follow via computer, mail, or courier delivery. Performed At: [facility] [place], [place] [id number] Rush Farmer MD  [id number] Performed at [facility], [address] [place], [place] [address]   Kappa/lambda light chains     Status: Abnormal   Collection Time: 12/12/17  3:32 PM  Result Value Ref Range   Kappa free light chain 117.4 (H) 3.3 - 19.4 mg/L   Lamda free light chains 24.2 5.7 - 26.3 mg/L   Kappa, lamda light chain ratio 4.85 (H) 0.26 - 1.65    Comment: (NOTE) Performed At: [facility] [place], [place] [id number] Rush Farmer MD [id number]   CBC with Differential     Status: Abnormal   Collection Time: 12/12/17  4:00 PM  Result Value Ref Range   WBC 7.9 3.8 - 10.6 K/uL   RBC 5.07 4.40 - 5.90 MIL/uL   Hemoglobin 13.8 13.0 - 18.0 g/dL   HCT 41.9 40.0 - 52.0 %  MCV 82.7 80.0 - 100.0 fL   MCH 27.3 26.0 - 34.0 pg   MCHC 33.0 32.0 - 36.0 g/dL   RDW 22.4 (H) 11.5 - 14.5 %   Platelets 232 150 - 440 K/uL   Neutrophils Relative % 73 %   Neutro Abs 5.8 1.4 - 6.5 K/uL   Lymphocytes Relative 16 %   Lymphs Abs 1.3 1.0 - 3.6 K/uL   Monocytes Relative 7 %   Monocytes Absolute 0.5 0.2 - 1.0 K/uL   Eosinophils Relative 3 %   Eosinophils Absolute 0.2 0 - 0.7 K/uL   Basophils Relative 1 %   Basophils Absolute 0.0 0 - 0.1 K/uL    Comment: Performed at [facility], [address] [place], [place] [address]  Comprehensive metabolic panel     Status: Abnormal   Collection Time: 12/12/17  4:00 PM  Result Value Ref Range   Sodium 138 135 - 145 mmol/L   Potassium 4.2 3.5 - 5.1 mmol/L   Chloride 108 98 - 111 mmol/L    Comment: Please note change in reference range.   CO2 23 22 - 32 mmol/L   Glucose, Bld 103 (H) 70 - 99 mg/dL    Comment: Please note change in reference range.   BUN 18 8 - 23 mg/dL    Comment: Please note change in reference range.   Creatinine, Ser 1.27 (H) 0.61 - 1.24 mg/dL   Calcium 9.6 8.9 - 10.3 mg/dL   Total Protein 8.1 6.5 - 8.1 g/dL   Albumin 3.7 3.5 - 5.0 g/dL   AST 22 15 - 41 U/L   ALT 21 0 - 44 U/L    Comment:  Please note change in reference range.   Alkaline Phosphatase 89 38 - 126 U/L   Total Bilirubin 0.3 0.3 - 1.2 mg/dL   GFR calc non Af Amer 54 (L) >60 mL/min   GFR calc Af Amer >60 >60 mL/min    Comment: (NOTE) The eGFR has been calculated using the CKD EPI equation. This calculation has not been validated in all clinical situations. eGFR's persistently <60 mL/min signify possible Chronic Kidney Disease.    Anion gap 7 5 - 15    Comment: Performed at [facility], [address] [place], [place] [address]    Radiology No results found.  Assessment/Plan BP (high blood pressure) blood pressure control important in reducing the progression of atherosclerotic disease. On appropriate oral medications.   HLD (hyperlipidemia) lipid control important in reducing the progression of atherosclerotic disease. Continue statin therapy   Tobacco use disorder We have discussed this many times and he understands it is deleterious to his vascular system but he really has no interest in quitting.  AAA (abdominal aortic aneurysm) without rupture (HCC) His duplex today shows significant decrease in the aortic sac size now measuring 3.7 cm in maximal diameter without an endoleak.  His ABIs were 1.04 on the right and 0.98 on the left with normal waveforms. Continue baby aspirin daily.  Aneurysm doing well status post repair.  Recheck in 1 year with duplex    Leotis Pain, MD  12/23/2017 11:25 AM    This note was created with Dragon medical transcription system.  Any errors from dictation are purely unintentional

## 2017-12-23 NOTE — Assessment & Plan Note (Signed)
His duplex today shows significant decrease in the aortic sac size now measuring 3.7 cm in maximal diameter without an endoleak.  His ABIs were 1.04 on the right and 0.98 on the left with normal waveforms. Continue baby aspirin daily.  Aneurysm doing well status post repair.  Recheck in 1 year with duplex

## 2018-01-16 ENCOUNTER — Encounter: Payer: Self-pay | Admitting: Podiatry

## 2018-01-16 ENCOUNTER — Ambulatory Visit: Payer: Medicare Other | Admitting: Podiatry

## 2018-01-16 DIAGNOSIS — I739 Peripheral vascular disease, unspecified: Secondary | ICD-10-CM | POA: Diagnosis not present

## 2018-01-16 DIAGNOSIS — M2011 Hallux valgus (acquired), right foot: Secondary | ICD-10-CM

## 2018-01-16 DIAGNOSIS — B351 Tinea unguium: Secondary | ICD-10-CM | POA: Diagnosis not present

## 2018-01-16 DIAGNOSIS — M2012 Hallux valgus (acquired), left foot: Secondary | ICD-10-CM | POA: Diagnosis not present

## 2018-01-16 DIAGNOSIS — M2141 Flat foot [pes planus] (acquired), right foot: Secondary | ICD-10-CM

## 2018-01-16 DIAGNOSIS — M79609 Pain in unspecified limb: Secondary | ICD-10-CM

## 2018-01-16 DIAGNOSIS — E1142 Type 2 diabetes mellitus with diabetic polyneuropathy: Secondary | ICD-10-CM | POA: Diagnosis not present

## 2018-01-16 DIAGNOSIS — M2142 Flat foot [pes planus] (acquired), left foot: Secondary | ICD-10-CM

## 2018-01-16 NOTE — Progress Notes (Signed)
Complaint:  Visit Type: Patient returns to my office for continued preventative foot care services. Complaint: Patient states" my nails have grown long and thick and become painful to walk and wear shoes" Patient has been diagnosed with DM with pvd and neuropathy.. The patient presents for preventative foot care services. No changes to ROS.  Patient has nail trauma left hallux..  Podiatric Exam: Vascular: dorsalis pedis are palpable  B/L. and posterior tibial pulses are not  palpable bilateral. Capillary return is immediate. Temperature gradient is WNL. Skin turgor WNL  Sensorium: Diminished  Semmes Weinstein monofilament test.  Nail Exam: Pt has thick disfigured discolored nails with subungual debris noted bilateral entire nail hallux through fifth toenails.  Absent second toenail right foot.  Nail injurt left hallux with no infection or drainage. Ulcer Exam: There is no evidence of ulcer or pre-ulcerative changes or infection. Orthopedic Exam: Muscle tone and strength are WNL. No limitations in general ROM. No crepitus or effusions noted. Foot type and digits show no abnormalities. Bony prominences are unremarkable. HAV  B/L and hammer toe  B/L.  Pes planus. Skin: No Porokeratosis. No infection or ulcers  Diagnosis:  Onychomycosis, , Pain in right toe, pain in left toes  Treatment & Plan Procedures and Treatment: Consent by patient was obtained for treatment procedures. The patient understood the discussion of treatment and procedures well. All questions were answered thoroughly reviewed. Debridement of mycotic and hypertrophic toenails, 1 through 5 bilateral and clearing of subungual debris. No ulceration, no infection noted. Patient qualifies for diabetic footgear due to DPN and HAV and pes planus. Patient to make an appointment with Liliane Channel. Return Visit-Office Procedure: Patient instructed to return to the office for a follow up visit 3 months for continued evaluation and treatment.    Gardiner Barefoot DPM

## 2018-01-25 ENCOUNTER — Ambulatory Visit: Payer: Medicare Other | Admitting: Orthotics

## 2018-01-25 DIAGNOSIS — E1142 Type 2 diabetes mellitus with diabetic polyneuropathy: Secondary | ICD-10-CM

## 2018-01-25 DIAGNOSIS — B351 Tinea unguium: Secondary | ICD-10-CM

## 2018-01-25 DIAGNOSIS — M2142 Flat foot [pes planus] (acquired), left foot: Secondary | ICD-10-CM

## 2018-01-25 DIAGNOSIS — M2141 Flat foot [pes planus] (acquired), right foot: Secondary | ICD-10-CM

## 2018-01-25 DIAGNOSIS — M2012 Hallux valgus (acquired), left foot: Secondary | ICD-10-CM

## 2018-01-25 DIAGNOSIS — M2011 Hallux valgus (acquired), right foot: Secondary | ICD-10-CM

## 2018-01-25 DIAGNOSIS — M79609 Pain in unspecified limb: Principal | ICD-10-CM

## 2018-01-25 DIAGNOSIS — L84 Corns and callosities: Secondary | ICD-10-CM

## 2018-01-25 DIAGNOSIS — I739 Peripheral vascular disease, unspecified: Secondary | ICD-10-CM

## 2018-01-25 NOTE — Progress Notes (Signed)

## 2018-02-07 ENCOUNTER — Ambulatory Visit (INDEPENDENT_AMBULATORY_CARE_PROVIDER_SITE_OTHER): Payer: Medicare Other | Admitting: Vascular Surgery

## 2018-02-07 ENCOUNTER — Encounter (INDEPENDENT_AMBULATORY_CARE_PROVIDER_SITE_OTHER): Payer: Medicare Other

## 2018-02-07 ENCOUNTER — Other Ambulatory Visit (INDEPENDENT_AMBULATORY_CARE_PROVIDER_SITE_OTHER): Payer: Medicare Other

## 2018-02-08 DIAGNOSIS — I059 Rheumatic mitral valve disease, unspecified: Secondary | ICD-10-CM | POA: Insufficient documentation

## 2018-02-13 ENCOUNTER — Inpatient Hospital Stay: Payer: Medicare Other | Attending: Oncology

## 2018-02-13 ENCOUNTER — Other Ambulatory Visit: Payer: Self-pay

## 2018-02-13 DIAGNOSIS — F17210 Nicotine dependence, cigarettes, uncomplicated: Secondary | ICD-10-CM | POA: Insufficient documentation

## 2018-02-13 DIAGNOSIS — D631 Anemia in chronic kidney disease: Secondary | ICD-10-CM | POA: Diagnosis not present

## 2018-02-13 DIAGNOSIS — Z7982 Long term (current) use of aspirin: Secondary | ICD-10-CM | POA: Insufficient documentation

## 2018-02-13 DIAGNOSIS — N183 Chronic kidney disease, stage 3 (moderate): Secondary | ICD-10-CM | POA: Diagnosis not present

## 2018-02-13 DIAGNOSIS — E8809 Other disorders of plasma-protein metabolism, not elsewhere classified: Secondary | ICD-10-CM

## 2018-02-13 DIAGNOSIS — C9000 Multiple myeloma not having achieved remission: Secondary | ICD-10-CM | POA: Diagnosis present

## 2018-02-13 DIAGNOSIS — I4891 Unspecified atrial fibrillation: Secondary | ICD-10-CM | POA: Insufficient documentation

## 2018-02-13 DIAGNOSIS — E1122 Type 2 diabetes mellitus with diabetic chronic kidney disease: Secondary | ICD-10-CM | POA: Insufficient documentation

## 2018-02-13 DIAGNOSIS — I129 Hypertensive chronic kidney disease with stage 1 through stage 4 chronic kidney disease, or unspecified chronic kidney disease: Secondary | ICD-10-CM | POA: Insufficient documentation

## 2018-02-13 LAB — COMPREHENSIVE METABOLIC PANEL
ALBUMIN: 3.6 g/dL (ref 3.5–5.0)
ALT: 11 U/L (ref 0–44)
AST: 15 U/L (ref 15–41)
Alkaline Phosphatase: 66 U/L (ref 38–126)
Anion gap: 8 (ref 5–15)
BUN: 13 mg/dL (ref 8–23)
CHLORIDE: 105 mmol/L (ref 98–111)
CO2: 25 mmol/L (ref 22–32)
CREATININE: 1.24 mg/dL (ref 0.61–1.24)
Calcium: 9.2 mg/dL (ref 8.9–10.3)
GFR calc Af Amer: >60 mL/min (ref >60)
GFR calc non Af Amer: 55 mL/min — ABNORMAL LOW (ref >60)
GLUCOSE: 98 mg/dL (ref 70–99)
Potassium: 4.5 mmol/L (ref 3.5–5.1)
Sodium: 138 mmol/L (ref 135–145)
Total Bilirubin: 0.6 mg/dL (ref 0.3–1.2)
Total Protein: 8.3 g/dL — ABNORMAL HIGH (ref 6.5–8.1)

## 2018-02-13 LAB — CBC WITH DIFFERENTIAL/PLATELET
BASOS PCT: 1 %
Basophils Absolute: 0.1 10*3/uL (ref 0–0.1)
EOS ABS: 0.3 10*3/uL (ref 0–0.7)
EOS PCT: 3 %
HCT: 41.5 % (ref 40.0–52.0)
Hemoglobin: 13.9 g/dL (ref 13.0–18.0)
LYMPHS ABS: 1.2 10*3/uL (ref 1.0–3.6)
Lymphocytes Relative: 10 %
MCH: 29.2 pg (ref 26.0–34.0)
MCHC: 33.5 g/dL (ref 32.0–36.0)
MCV: 87.2 fL (ref 80.0–100.0)
MONO ABS: 0.9 10*3/uL (ref 0.2–1.0)
Monocytes Relative: 8 %
Neutro Abs: 9.1 10*3/uL — ABNORMAL HIGH (ref 1.4–6.5)
Neutrophils Relative %: 78 %
PLATELETS: 291 10*3/uL (ref 150–440)
RBC: 4.76 MIL/uL (ref 4.40–5.90)
RDW: 18 % — AB (ref 11.5–14.5)
WBC: 11.6 10*3/uL — ABNORMAL HIGH (ref 3.8–10.6)

## 2018-02-14 LAB — MULTIPLE MYELOMA PANEL, SERUM
ALBUMIN/GLOB SERPL: 0.8 (ref 0.7–1.7)
ALPHA 1: 0.4 g/dL (ref 0.0–0.4)
ALPHA2 GLOB SERPL ELPH-MCNC: 1.1 g/dL — AB (ref 0.4–1.0)
Albumin SerPl Elph-Mcnc: 3.1 g/dL (ref 2.9–4.4)
B-GLOBULIN SERPL ELPH-MCNC: 1.1 g/dL (ref 0.7–1.3)
Gamma Glob SerPl Elph-Mcnc: 1.9 g/dL — ABNORMAL HIGH (ref 0.4–1.8)
Globulin, Total: 4.4 g/dL — ABNORMAL HIGH (ref 2.2–3.9)
IGM (IMMUNOGLOBULIN M), SRM: 132 mg/dL (ref 15–143)
IgA: 247 mg/dL (ref 61–437)
IgG (Immunoglobin G), Serum: 1958 mg/dL — ABNORMAL HIGH (ref 700–1600)
TOTAL PROTEIN ELP: 7.5 g/dL (ref 6.0–8.5)

## 2018-02-14 LAB — KAPPA/LAMBDA LIGHT CHAINS
KAPPA, LAMDA LIGHT CHAIN RATIO: 3.63 — AB (ref 0.26–1.65)
Kappa free light chain: 140.4 mg/L — ABNORMAL HIGH (ref 3.3–19.4)
Lambda free light chains: 38.7 mg/L — ABNORMAL HIGH (ref 5.7–26.3)

## 2018-02-20 ENCOUNTER — Encounter: Payer: Self-pay | Admitting: Oncology

## 2018-02-20 ENCOUNTER — Other Ambulatory Visit: Payer: Self-pay

## 2018-02-20 ENCOUNTER — Inpatient Hospital Stay (HOSPITAL_BASED_OUTPATIENT_CLINIC_OR_DEPARTMENT_OTHER): Payer: Medicare Other | Admitting: Oncology

## 2018-02-20 VITALS — BP 102/67 | HR 77 | Temp 96.9°F | Resp 18 | Wt 187.7 lb

## 2018-02-20 DIAGNOSIS — N183 Chronic kidney disease, stage 3 unspecified: Secondary | ICD-10-CM

## 2018-02-20 DIAGNOSIS — C9000 Multiple myeloma not having achieved remission: Secondary | ICD-10-CM | POA: Diagnosis not present

## 2018-02-20 DIAGNOSIS — I129 Hypertensive chronic kidney disease with stage 1 through stage 4 chronic kidney disease, or unspecified chronic kidney disease: Secondary | ICD-10-CM

## 2018-02-20 DIAGNOSIS — D631 Anemia in chronic kidney disease: Secondary | ICD-10-CM | POA: Diagnosis not present

## 2018-02-20 DIAGNOSIS — M069 Rheumatoid arthritis, unspecified: Secondary | ICD-10-CM | POA: Diagnosis not present

## 2018-02-20 DIAGNOSIS — Z7982 Long term (current) use of aspirin: Secondary | ICD-10-CM

## 2018-02-20 DIAGNOSIS — F17210 Nicotine dependence, cigarettes, uncomplicated: Secondary | ICD-10-CM

## 2018-02-20 DIAGNOSIS — E1122 Type 2 diabetes mellitus with diabetic chronic kidney disease: Secondary | ICD-10-CM

## 2018-02-20 DIAGNOSIS — I4891 Unspecified atrial fibrillation: Secondary | ICD-10-CM

## 2018-02-20 NOTE — Progress Notes (Signed)
Patient here for follow up

## 2018-02-20 NOTE — Progress Notes (Signed)
Hematology/Oncology Follow Up Note [facility] [phone] [phone]  Patient Care Team: Jodi Marble, MD as PCP - General (Internal Medicine) Bary Castilla, Forest Gleason, MD (General Surgery) Edrick Kins, MD as Rounding Team (Internal Medicine) Hillary Bow, MD as Consulting Physician (Internal Medicine) [name], MD as Surgeon (Gastroenterology) Isaias Cowman, MD as Consulting Physician (Cardiology)  REFERRING PROVIDER: Dr.Lateef, [place]  REASON FOR VISIT Follow up for management of plasma cell dyscrasia.   HISTORY OF PRESENTING ILLNESS:  77 y.o.  male with PMH listed below who presents to follow up on the evaluation and management of his abnormal urine protein electrophoresis results. I reviewed the records from [facility], [place] and [place] was performed and HemOnc related medical problems are listed below.  He lives with a friend. He has 3 adult children.   1 Chronic Kidney disease, Stage III: patient follows up with Dr.Lateef.  2 Proteinuria:  02/18/2017: Albumin/Creatinine ratio was 7, Urine protein electrophoresis,Random Urine revealed M Spike 43.2%, total protein of 43.34m/dl,  01/05/2017 Albumin/Creatinine ratio was 455.2, Albumin 996.5, , 2.15.2018 Albumin/Creatinine ratio was 80.3, Albumin 73.3, Urine protein electrophoresis,Random Urine revealed M Spike 29.7%, total protein of 29.154mdl, 06/30/2016 Serum protein electrophoresis did not detect M spike.  Autoimmune disorder work up showed Negative anti dsDNA, RNP antibodies, smith antibody, Sjogren antibodies, anti Jo-1, positive antichromotin antibodies, positive ANA,  3 Anemia of chronic kidney disease:  4 Iron deficiency anemia: history of iron deficiency, ferritin was 7 on 08/11/2016. He had EGD and colonoscopy that were done this year which showed duodenitis/esphagitis/diverticulosis/benigh polyps. 5 Rheumatoid  arthritis: he follows up with Dr.Kernodle and is on chronic MTX. He Is currently off MTX reports joint pains are controlled, not quite symptomatic.   Patient denies any persistent bone pain or any pain. He continue to feel lack of energy, persistent, not improved with resting or taking naps. He also has lost 25 pounds in the past year which was unintentional.   # Rheumatoid arthritis and currently is off MTX.  # It is ambiguous whether he has truly symptomatic multiple myeloma or a smoldering myeloma, given that anemia can be secondary to iron deficiency and CKD.  The hypermetabolic 2.1 cm hypermetabolic soft tissue lesion in the left heel, can reflect a Plasmacytoma,which should have been biopsied. However patient initially denies being on any blood thinner, later he was found to be on Plavix with pre biopsy questionnaire screening, and biopsy was held. Patient has to contact cardiologist to hold plavix for seven days before procedure. He feels frustrated about multiple workup and meanwhile he becomes more fatigues, with worsening of kidney function. Patient was reluctant about proceeding additional invasive procedures and wants to start treatment. Decision was made to start active MM treatment.   # [facility] bone marrow transplant. He is not considered to  good candidate for transplant. Patient cardiologist Dr. KaChancy Milroyad started patient on Lasix 20 mg daily. [facility] as patient was having dizziness and borderline blood pressure during his clinic visit there.  # [facility]  Current Treatment:  S/p RVD x 4 cycles tolerates well except cytopenia.  Revlimid 10 mg PO once per day on days 1 to 14 (dose reduced due to GFR) Revlimid renal dosing of 1025m Velcade 1.3 mg/m2 IV once per day on days 1,  8, 15 Dexamethasone 20  mg PO once per day on days 1, 8, 15 given patient's  diabetes Given patient's multiple comorbidity and the side effects of cytopenia during his RVD treatment and the uncertainty of this is really a site of plasmacytoma or not, I recommend not to continue him on maintenance Revlimid.   INTERVAL HISTORY Patient presents for follow-up of management of multiple myeloma/smoldering multiple myeloma. He is not currently on any treatment. Patient reports feeling well.  Denies any fatigue, weight loss, bone pain.  Denies any fever chills night sweats, appetite is good.   Chronic shortness of breath with exertion has improved.    Review of Systems  Constitutional: Negative for appetite change, chills, diaphoresis, fatigue, fever and unexpected weight change.  HENT:   Negative for hearing loss, lump/mass, mouth sores, nosebleeds, sore throat and tinnitus.   Eyes: Negative for eye problems and icterus.  Respiratory: Negative for chest tightness, cough, hemoptysis, shortness of breath and wheezing.   Cardiovascular: Negative for chest pain and leg swelling.  Gastrointestinal: Negative for abdominal distention, abdominal pain, blood in stool, constipation, diarrhea, nausea and rectal pain.  Endocrine: Negative for hot flashes.  Genitourinary: Negative for bladder incontinence, difficulty urinating, dysuria, frequency, hematuria and nocturia.   Musculoskeletal: Negative for arthralgias, back pain, flank pain, gait problem and myalgias.  Skin: Negative for itching and rash.  Neurological: Negative for dizziness, gait problem, headaches, numbness and seizures.  Hematological: Negative for adenopathy. Does not bruise/bleed easily.  Psychiatric/Behavioral: Negative for confusion, decreased concentration and depression. The patient is not nervous/anxious.      MEDICAL HISTORY:  Past Medical History:  Diagnosis Date  . Anxiety   . Arthritis    rheumatoid  . Atrial fibrillation (HCC)    hx of  . Cancer ([place])   . Chronic kidney disease   . Colon polyp  07-07-15   TUBULAR ADENOMA WITH AT LEAST HIGH-GRADE / Dr Rayann Heman  . Diabetes mellitus without complication ([place])   . Dysrhythmia    bradycardia...Marland KitchenMarland Kitchen2 to 1 heart block  . GERD (gastroesophageal reflux disease)   . Heart murmur    patient unaware of history of murmur  . Hypercholesterolemia   . Hypertension   . Presence of permanent cardiac pacemaker   . Prostate cancer ([place]) 01/01/13, 01/30/14   Gleason 3+4=7, volume 46.6 cc  . Rheumatoid arthritis ([place])   . S/P radiation therapy  04/03/2014 through 06/04/2014                                                      Prostate 7800 cGy in 40 sessions                          . Sleep apnea    uses cpap but it is not in the hospital with him    SURGICAL HISTORY: Past Surgical History:  Procedure Laterality Date  . ABDOMINAL AORTIC ANEURYSM REPAIR  11/2013  . COLON SURGERY  March 2017   Right hemicolectomy for tubulovillous adenoma with high-grade dysplasia.  . COLONOSCOPY WITH PROPOFOL N/A 07/07/2015   Procedure: COLONOSCOPY WITH PROPOFOL;  Surgeon: Josefine Class, MD;  Location: [facility] ENDOSCOPY;  Service: Endoscopy;  Laterality: N/A;  . COLONOSCOPY WITH PROPOFOL N/A 10/28/2016   Procedure: COLONOSCOPY WITH PROPOFOL;  Surgeon: [name], MD;  Location: [place];  Service: Endoscopy;  Laterality: N/A;  . ESOPHAGOGASTRODUODENOSCOPY (EGD) WITH PROPOFOL N/A 10/28/2016   Procedure: ESOPHAGOGASTRODUODENOSCOPY (EGD) WITH PROPOFOL;  Surgeon: [name], MD;  [facility] ENDOSCOPY;  Service: Endoscopy;  Laterality: N/A;  . GIVENS CAPSULE STUDY N/A 07/25/2017   Procedure: GIVENS CAPSULE STUDY;  Surgeon: [name], MD;  Location: [facility] ENDOSCOPY;  Service: Gastroenterology;  Laterality: N/A;  . LAPAROSCOPIC RIGHT COLECTOMY Right 08/08/2015   Procedure: LAPAROSCOPIC RIGHT COLECTOMY;  Surgeon: Robert Bellow, MD;  Location: [facility] ORS;  Service: General;  Laterality: Right;  . NASAL SINUS SURGERY    . PACEMAKER INSERTION Left 08/26/2016    Procedure: INSERTION PACEMAKER;  Surgeon: Isaias Cowman, MD;  Location: [facility] ORS;  Service: Cardiovascular;  Laterality: Left;  . PROSTATE BIOPSY  01/01/13, 01/30/14   Gleason 3+3=6, vol 46.6 cc  . TONSILLECTOMY    . uvula surgery     for sleep apnea    SOCIAL HISTORY: Social History   Socioeconomic History  . Marital status: Divorced    Spouse name: Not on file  . Number of children: Not on file  . Years of education: Not on file  . Highest education level: Not on file  Occupational History  . Not on file  Social Needs  . Financial resource strain: Not on file  . Food insecurity:    Worry: Not on file    Inability: Not on file  . Transportation needs:    Medical: Not on file    Non-medical: Not on file  Tobacco Use  . Smoking status: Current Every Day Smoker    Packs/day: 0.50    Years: 50.00    Pack years: 25.00    Types: Cigarettes  . Smokeless tobacco: Never Used  . Tobacco comment: DOWN TO 1/2 PPD  Substance and Sexual Activity  . Alcohol use: Yes    Alcohol/week: 1.0 standard drinks    Types: 1 Cans of beer per week    Comment: occasionally  . Drug use: No  . Sexual activity: Not Currently  Lifestyle  . Physical activity:    Days per week: Not on file    Minutes per session: Not on file  . Stress: Not on file  Relationships  . Social connections:    Talks on phone: Not on file    Gets together: Not on file    Attends religious service: Not on file    Active member of club or organization: Not on file    Attends meetings of clubs or organizations: Not on file    Relationship status: Not on file  . Intimate partner violence:    Fear of current or ex partner: Not on file    Emotionally abused: Not on file    Physically abused: Not on file    Forced sexual activity: Not on file  Other Topics Concern  . Not on file  Social History Narrative  . Not on file    FAMILY HISTORY: Family History  Problem Relation Age of Onset  . Heart attack Mother     . Cirrhosis Father   . Pancreatic cancer Brother   . Diabetes Brother   . Diabetes Daughter        medication induced for cancer treatments  . Cancer Daughter        breast, brain    ALLERGIES:  has No Known Allergies.  MEDICATIONS:  Current Outpatient Medications  Medication Sig Dispense Refill  . acetaminophen (TYLENOL) 325 MG tablet Take 325 mg by mouth  every 6 (six) hours as needed.    . ALPRAZolam (XANAX) 0.25 MG tablet Take by mouth 2 (two) times daily as needed.     Marland Kitchen amLODipine-benazepril (LOTREL) 5-20 MG capsule Take 1 capsule by mouth daily.    Marland Kitchen aspirin 81 MG chewable tablet Chew 81 mg by mouth daily.    Marland Kitchen atorvastatin (LIPITOR) 80 MG tablet Take 80 mg by mouth at bedtime.     . Butalbital-APAP-Caffeine (FIORICET) 50-300-40 MG CAPS Take 1 capsule every four to six hours as needed    . Cholecalciferol (D3-50) 50000 units capsule Take 50,000 Units by mouth once a week.     . fexofenadine (ALLEGRA) 180 MG tablet Take 180 mg by mouth daily.    . fluticasone (FLONASE) 50 MCG/ACT nasal spray Place 2 sprays into both nostrils daily as needed for rhinitis.     . Fluticasone Furoate-Vilanterol (BREO ELLIPTA) 100-25 MCG/INH AEPB Inhale 1 puff into the lungs daily.     Marland Kitchen gabapentin (NEURONTIN) 300 MG capsule Take 300 mg by mouth 3 (three) times daily.  2  . glucose blood (ONE TOUCH ULTRA TEST) test strip     . isosorbide mononitrate (IMDUR) 30 MG 24 hr tablet Take 30 mg by mouth daily.    Marland Kitchen loperamide (IMODIUM) 2 MG capsule Take 1 capsule (2 mg total) by mouth See admin instructions. With onset of diarrhea, take '4mg'$  followed by '2mg'$  every 2 hours resolved. Maximum: 16 mg/day 120 capsule 0  . metFORMIN (GLUCOPHAGE) 500 MG tablet Take 500 mg by mouth daily with breakfast.     . metoprolol succinate (TOPROL-XL) 50 MG 24 hr tablet Take 50 mg by mouth daily. Take with or immediately following a meal.    . pantoprazole (PROTONIX) 40 MG tablet Take 1 tablet (40 mg total) by mouth 2 (two)  times daily. 60 tablet 3  . triamcinolone cream (KENALOG) 0.1 % Apply 1 application topically 2 (two) times daily.    . clopidogrel (PLAVIX) 75 MG tablet Take by mouth.    . colchicine 0.6 MG tablet     . folic acid (FOLVITE) 1 MG tablet Take by mouth.    . furosemide (LASIX) 20 MG tablet Take 20 mg by mouth daily.    . ondansetron (ZOFRAN) 8 MG tablet Take 1 tablet (8 mg total) 2 (two) times daily as needed by mouth (Nausea or vomiting). (Patient not taking: Reported on 02/20/2018) 30 tablet 1   No current facility-administered medications for this visit.       Marland Kitchen  PHYSICAL EXAMINATION: ECOG PERFORMANCE STATUS: 1 - Symptomatic but completely ambulatory Vitals:   02/20/18 1035  BP: 102/67  Pulse: 77  Resp: 18  Temp: (!) 96.9 F (36.1 C)   Filed Weights   02/20/18 1035  Weight: 187 lb 11.2 oz (85.1 kg)   Physical Exam  Constitutional: He is oriented to person, place, and time and well-developed, well-nourished, and in no distress. No distress.  HENT:  Head: Normocephalic and atraumatic.  Nose: Nose normal.  Mouth/Throat: Oropharynx is clear and moist. No oropharyngeal exudate.  Eyes: Pupils are equal, round, and reactive to light. EOM are normal. Right eye exhibits no discharge. Left eye exhibits no discharge. No scleral icterus.  Neck: Normal range of motion. Neck supple. No JVD present.  Cardiovascular: Normal rate, regular rhythm and normal heart sounds. Exam reveals no friction rub.  No murmur heard. Pulmonary/Chest: Effort normal and breath sounds normal. No respiratory distress. He has no wheezes. He has no  rales. He exhibits no tenderness.  Abdominal: Soft. Bowel sounds are normal. He exhibits no distension and no mass. There is no tenderness. There is no rebound.  Musculoskeletal: Normal range of motion. He exhibits no edema or tenderness.  Lymphadenopathy:    He has no cervical adenopathy.  Neurological: He is alert and oriented to person, place, and time. No cranial  nerve deficit. He exhibits normal muscle tone. Coordination normal.  Skin: Skin is warm and dry. No rash noted. He is not diaphoretic. No erythema.  Psychiatric: Affect and judgment normal.    LABORATORY DATA:  I have reviewed the data as listed Lab Results  Component Value Date   WBC 11.6 (H) 02/13/2018   HGB 13.9 02/13/2018   HCT 41.5 02/13/2018   MCV 87.2 02/13/2018   PLT 291 02/13/2018   Recent Labs    10/03/17 1035 12/12/17 1600 02/13/18 1147  NA 139 138 138  K 4.4 4.2 4.5  CL 107 108 105  CO2 '23 23 25  '$ GLUCOSE 120* 103* 98  BUN '19 18 13  '$ CREATININE 1.35* 1.27* 1.24  CALCIUM 9.5 9.6 9.2  GFRNONAA 50* 54* 55*  GFRAA 58* >60 >60  PROT 8.7* 8.1 8.3*  ALBUMIN 3.9 3.7 3.6  AST 32 22 15  ALT 38 21 11  ALKPHOS 122 89 66  BILITOT 0.4 0.3 0.6    SPEP: no M spike Serum free light chain Kappa/Lamda ratio 13.04 UPEP  Free light chain Kappa/lamda ratio 162.43, M spike '198mg'$ /24 hour   Bone marrow biopsy 03/21/2017  Bone Marrow, Aspirate,Biopsy, and Clot, left iliac - HYPERCELLULAR BONE MARROW FOR AGE WITH TRILINEAGE HEMATOPOIESIS. - PLASMACYTOSIS (PLASMA CELLS 8%)  Karyotype: loss of Y chromosome Cytogenetic MDS FISH Panel negative.   Bone marrow 04/06/2017  Bone Marrow, Aspirate,Biopsy, and Clot, right iliac and core - HYPERCELLULAR BONE MARROW FOR AGE WITH PLASMA CELL NEOPLASM - TRILINEAGE HEMATOPOIESIS. - SEE COMMENT. PERIPHERAL BLOOD: - MICROCYTIC-HYPOCHROMIC ANEMIA. Diagnosis Note The bone marrow is hypercellular with trilineage hematopoiesis but with relative abundance of erythroid precursors and increased number of megakaryocytes with nonspecific changes. Significant dyspoiesis is not seen. Iron stores are present with no ring sideroblasts. The plasma cells are increased in number representing 8% of all cells in the aspirate although focal areas in the core biopsy show 10 to 20% as primarily seen by CD138 stain. In situ hybridization for kappa and lambda  light chains show kappa light chain restriction consistent with plasma cell neoplasm. Correlation with cytogenetic and FISH studies is recommended. (BNS:ecj/gt 04/07/2017)  IMAGE STUDIES I have personally reviewed below image results.  03/15/2017 DG bone survey Met: Nonspecific lucent lesion in the distal right ulna measuring 15-16 mm, but otherwise normal bone mineralization for age throughout the visible skeleton. A solitary lytic lesion in a distal extremity would be an unusual presentation of multiple myeloma, and I favor the distal right ulna lesion is benign. Recommend correlation with serum and urine protein electrophoresis.  04/14/2017 PET scan: 1. 2.1 cm hypermetabolic soft tissue lesion in the left heel, adjacent to the calcaneal tuberosity. 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 2. Mottled FDG accumulation diffusely in the marrow space without other frankly overt hypermetabolic bony lesion. 3. Coronary artery and thoracoabdominal aortic atherosclerosis with abdominal aortic stent graft visualized in situ. 4. Bilateral renal cysts and bilateral nonobstructing renal stones.  08/24/2017 PET scan 1. No hypermetabolic osseous lesions. No definite signs of multiple myeloma on CT imaging. 2. Interval improvement in soft tissue activity along the medial  aspect of the left calcaneal tuberosity, likely plantar fasciitis. 3. New small left pleural effusion. 4. Bilateral inguinal hernias containing small bowel on the right and sigmoid colon on the left. No evidence of incarceration or obstruction. 5. Otherwise stable incidental findings including diffuse atherosclerosis, bilateral renal cysts, emphysema and sigmoid diverticulosis.  ASSESSMENT & PLAN:  1. Multiple myeloma, remission status unspecified ([place])   2. CKD (chronic kidney disease) stage 3, GFR 30-59 ml/min (HCC)    # Plasma cell dyscrasia. He at least have smoldering multiple myeloma, or active myeloma if the hypermetabolic lesion on  PET is  Related to MM. Previously discussed and he agrees with holding maintenance treatment and close monitor disease.  Multiple myeloma labs discussed with patient.  No M spike observed [no M spike at baseline prior to treatment] Free light chain ratio stable. Hemoglobin remained stable since small bowel AVM ablated. Discussed with patient that we will continue with close monitoring.  Repeat multiple myeloma labs in 3 months.  # chronic renal insufficiency stage 3, kidney function fluctuates.  Follow-up with nephrology.   # continue Aspirin '81mg'$  and Plavix, close monitor platelet coutns.   Follow up in 3 months repeat cbc.,cmp and MM labs.  We spent sufficient time to discuss many aspect of care, questions were answered to patient's satisfaction. The patient knows to call the clinic with any problems questions or concerns. Total face to face encounter time for this patient visit was 15 min. >50% of the time was  spent in counseling and coordination of care.  Earlie Server, MD, PhD Hematology Oncology [facility] at [facility] Pager- [phone] 02/20/2018

## 2018-03-10 DIAGNOSIS — J90 Pleural effusion, not elsewhere classified: Secondary | ICD-10-CM | POA: Insufficient documentation

## 2018-03-29 ENCOUNTER — Ambulatory Visit (INDEPENDENT_AMBULATORY_CARE_PROVIDER_SITE_OTHER): Payer: Medicare Other | Admitting: Orthotics

## 2018-03-29 DIAGNOSIS — M2142 Flat foot [pes planus] (acquired), left foot: Secondary | ICD-10-CM

## 2018-03-29 DIAGNOSIS — E1142 Type 2 diabetes mellitus with diabetic polyneuropathy: Secondary | ICD-10-CM | POA: Diagnosis not present

## 2018-03-29 DIAGNOSIS — M2141 Flat foot [pes planus] (acquired), right foot: Secondary | ICD-10-CM

## 2018-03-29 DIAGNOSIS — M2012 Hallux valgus (acquired), left foot: Secondary | ICD-10-CM | POA: Diagnosis not present

## 2018-03-29 DIAGNOSIS — B351 Tinea unguium: Secondary | ICD-10-CM

## 2018-03-29 DIAGNOSIS — M79609 Pain in unspecified limb: Secondary | ICD-10-CM

## 2018-03-29 DIAGNOSIS — I739 Peripheral vascular disease, unspecified: Secondary | ICD-10-CM

## 2018-03-29 NOTE — Progress Notes (Signed)

## 2018-04-24 ENCOUNTER — Encounter: Payer: Self-pay | Admitting: Podiatry

## 2018-04-24 ENCOUNTER — Ambulatory Visit: Payer: Medicare Other | Admitting: Podiatry

## 2018-04-24 DIAGNOSIS — I739 Peripheral vascular disease, unspecified: Secondary | ICD-10-CM | POA: Diagnosis not present

## 2018-04-24 DIAGNOSIS — D689 Coagulation defect, unspecified: Secondary | ICD-10-CM | POA: Diagnosis not present

## 2018-04-24 DIAGNOSIS — B351 Tinea unguium: Secondary | ICD-10-CM

## 2018-04-24 DIAGNOSIS — L84 Corns and callosities: Secondary | ICD-10-CM | POA: Diagnosis not present

## 2018-04-24 DIAGNOSIS — M79609 Pain in unspecified limb: Secondary | ICD-10-CM

## 2018-04-24 NOTE — Progress Notes (Signed)
Complaint:  Visit Type: Patient returns to my office for continued preventative foot care services. Complaint: Patient states" my nails have grown long and thick and become painful to walk and wear shoes" Patient has been diagnosed with DM with pvd and neuropathy.. The patient presents for preventative foot care services. No changes to ROS.  Patient has painful corn second toe right foot.  Podiatric Exam: Vascular: dorsalis pedis are palpable  B/L. and posterior tibial pulses are not  palpable bilateral. Capillary return is immediate. Temperature gradient is WNL. Skin turgor WNL  Sensorium: Diminished  Semmes Weinstein monofilament test.  Nail Exam: Pt has thick disfigured discolored nails with subungual debris noted bilateral entire nail hallux through fifth toenails.   Ulcer Exam: There is no evidence of ulcer or pre-ulcerative changes or infection. Orthopedic Exam: Muscle tone and strength are WNL. No limitations in general ROM. No crepitus or effusions noted. Foot type and digits show no abnormalities. Bony prominences are unremarkable. HAV  B/L and hammer toe  B/L.  Pes planus. Skin: No Porokeratosis. No infection or ulcers.  Clavi second toe right.  Diagnosis:  Onychomycosis, , Pain in right toe, pain in left toes,  Clavi 2nd right  Treatment & Plan Procedures and Treatment: Consent by patient was obtained for treatment procedures. The patient understood the discussion of treatment and procedures well. All questions were answered thoroughly reviewed. Debridement of mycotic and hypertrophic toenails, 1 through 5 bilateral and clearing of subungual debris. No ulceration, no infection noted. Debride clavi second toe right. Return Visit-Office Procedure: Patient instructed to return to the office for a follow up visit 3 months for continued evaluation and treatment.    Gardiner Barefoot DPM

## 2018-05-08 ENCOUNTER — Encounter: Payer: Self-pay | Admitting: Occupational Therapy

## 2018-05-08 ENCOUNTER — Other Ambulatory Visit: Payer: Self-pay

## 2018-05-08 ENCOUNTER — Ambulatory Visit: Payer: Medicare Other | Attending: Rheumatology | Admitting: Occupational Therapy

## 2018-05-08 DIAGNOSIS — M25641 Stiffness of right hand, not elsewhere classified: Secondary | ICD-10-CM | POA: Diagnosis present

## 2018-05-08 DIAGNOSIS — M25631 Stiffness of right wrist, not elsewhere classified: Secondary | ICD-10-CM | POA: Diagnosis present

## 2018-05-08 DIAGNOSIS — G8929 Other chronic pain: Secondary | ICD-10-CM | POA: Diagnosis present

## 2018-05-08 DIAGNOSIS — M79641 Pain in right hand: Secondary | ICD-10-CM

## 2018-05-08 DIAGNOSIS — M25642 Stiffness of left hand, not elsewhere classified: Secondary | ICD-10-CM | POA: Diagnosis present

## 2018-05-08 DIAGNOSIS — M25611 Stiffness of right shoulder, not elsewhere classified: Secondary | ICD-10-CM | POA: Insufficient documentation

## 2018-05-08 DIAGNOSIS — M25531 Pain in right wrist: Secondary | ICD-10-CM

## 2018-05-08 DIAGNOSIS — M62838 Other muscle spasm: Secondary | ICD-10-CM | POA: Diagnosis present

## 2018-05-08 DIAGNOSIS — M79642 Pain in left hand: Secondary | ICD-10-CM

## 2018-05-08 DIAGNOSIS — M25511 Pain in right shoulder: Secondary | ICD-10-CM | POA: Diagnosis present

## 2018-05-08 NOTE — Therapy (Signed)
[place] [facility] [address] [place], [address] Phone: [phone]   Fax:  [phone]  Occupational Therapy Evaluation  Patient Details  Name: Omar [name] MRN: [id number] Date of Birth: [birth year] Referring Provider (OT): Jefm Bryant    Encounter Date: 05/08/2018  OT End of Session - 05/08/18 1722    Visit Number  1    Number of Visits  4    Date for OT Re-Evaluation  06/05/18    OT Start Time  1350    OT Stop Time  1448    OT Time Calculation (min)  58 min    Activity Tolerance  Patient tolerated treatment well    Behavior During Therapy  [facility] for tasks assessed/performed       Past Medical History:  Diagnosis Date  . Anxiety   . Arthritis    rheumatoid  . Atrial fibrillation (HCC)    hx of  . Cancer ([place])   . Chronic kidney disease   . Colon polyp 07-07-15   TUBULAR ADENOMA WITH AT LEAST HIGH-GRADE / Dr Rayann Heman  . Diabetes mellitus without complication ([place])   . Dysrhythmia    bradycardia...Marland KitchenMarland Kitchen2 to 1 heart block  . GERD (gastroesophageal reflux disease)   . Heart murmur    patient unaware of history of murmur  . Hypercholesterolemia   . Hypertension   . Presence of permanent cardiac pacemaker   . Prostate cancer ([place]) 01/01/13, 01/30/14   Gleason 3+4=7, volume 46.6 cc  . Rheumatoid arthritis ([place])   . S/P radiation therapy  04/03/2014 through 06/04/2014                                                      Prostate 7800 cGy in 40 sessions                          . Sleep apnea    uses cpap but it is not in the hospital with him    Past Surgical History:  Procedure Laterality Date  . ABDOMINAL AORTIC ANEURYSM REPAIR  11/2013  . COLON SURGERY  March 2017   Right hemicolectomy for tubulovillous adenoma with high-grade dysplasia.  . COLONOSCOPY WITH PROPOFOL N/A 07/07/2015   Procedure: COLONOSCOPY WITH PROPOFOL;  Surgeon: Josefine Class, MD;  Location: [facility] ENDOSCOPY;  Service: Endoscopy;   Laterality: N/A;  . COLONOSCOPY WITH PROPOFOL N/A 10/28/2016   Procedure: COLONOSCOPY WITH PROPOFOL;  Surgeon: [name], MD;  Location: [facility] ENDOSCOPY;  Service: Endoscopy;  Laterality: N/A;  . ESOPHAGOGASTRODUODENOSCOPY (EGD) WITH PROPOFOL N/A 10/28/2016   Procedure: ESOPHAGOGASTRODUODENOSCOPY (EGD) WITH PROPOFOL;  Surgeon: [name], MD;  Location: [facility] ENDOSCOPY;  Service: Endoscopy;  Laterality: N/A;  . GIVENS CAPSULE STUDY N/A 07/25/2017   Procedure: GIVENS CAPSULE STUDY;  Surgeon: [name], MD;  Location: [facility] ENDOSCOPY;  Service: Gastroenterology;  Laterality: N/A;  . LAPAROSCOPIC RIGHT COLECTOMY Right 08/08/2015   Procedure: LAPAROSCOPIC RIGHT COLECTOMY;  Surgeon: Robert Bellow, MD;  Location: [facility] ORS;  Service: General;  Laterality: Right;  . NASAL SINUS SURGERY    . PACEMAKER INSERTION Left 08/26/2016   Procedure: INSERTION PACEMAKER;  Surgeon: Isaias Cowman, MD;  Location: [facility] ORS;  Service: Cardiovascular;  Laterality: Left;  . PROSTATE BIOPSY  01/01/13, 01/30/14   Gleason 3+3=6, vol  46.6 cc  . TONSILLECTOMY    . uvula surgery     for sleep apnea    There were no vitals filed for this visit.  Subjective Assessment - 05/08/18 1701    Subjective   Since I come off my chemo drugs about 3 months ag0 my hands , wrist and R shoulder pain got worse - the  R worse than L - hands are stiff, hurting  - but I had RA for while     Patient Stated Goals  If I can get the pain better in my R shoulder,wrist and hand and L hand and wrist     Currently in Pain?  Yes    Pain Score  4     Pain Location  Hand   wrist    Pain Orientation  Right    Pain Descriptors / Indicators  Aching    Pain Type  Chronic pain    Pain Onset  More than a month ago    Pain Frequency  Constant        [facility] OT Assessment - 05/08/18 0001      Assessment   Medical Diagnosis  RA - hand pain and stiffness    Referring Provider (OT)  Jefm Bryant     Onset Date/Surgical Date  02/06/18    Hand  Dominance  Right      Home  Environment   Lives With  Alone      Prior Function   Vocation  Retired    Leisure  retired Training and development officer , likes to watch tv,       AROM   Right Forearm Pronation  90 Degrees    Right Forearm Supination  75 Degrees    Right Wrist Extension  35 Degrees    Right Wrist Flexion  70 Degrees    Left Wrist Extension  45 Degrees    Left Wrist Flexion  70 Degrees      Strength   Right Hand Grip (lbs)  40    Right Hand Lateral Pinch  12 lbs    Right Hand 3 Point Pinch  8 lbs    Left Hand Grip (lbs)  34    Left Hand Lateral Pinch  12 lbs    Left Hand 3 Point Pinch  10 lbs      Right Hand AROM   R Index  MCP 0-90  90 Degrees    R Index PIP 0-100  97 Degrees   -15   R Long  MCP 0-90  90 Degrees    R Long PIP 0-100  100 Degrees   -35   R Ring  MCP 0-90  90 Degrees    R Ring PIP 0-100  100 Degrees   -30   R Little  MCP 0-90  100 Degrees    R Little PIP 0-100  95 Degrees   -42     Left Hand AROM   L Index  MCP 0-90  90 Degrees    L Index PIP 0-100  100 Degrees   -10   L Long  MCP 0-90  90 Degrees    L Long PIP 0-100  -10 Degrees    L Ring  MCP 0-90  90 Degrees    L Ring PIP 0-100  100 Degrees   -30   L Little  MCP 0-90  90 Degrees    L Little PIP 0-100  100 Degrees    L Little DIP 0-70  --   -  10       Paraffin done to bilateral hands - pt ed on use of it  Done graston tool nr 2 for brushing on volar hand and digits - prior ot review of PROM PIP extention -had great progress after graston   Review HEP with pt and hand out provided:  Moist heat to hands and wrist am and pm AROM for wrist in all planes   stop when feeling slight pull or stretch  10 reps  wear Benik wrist splint with act that cause wrist pain   Gentle PROM for PIP extention  Hold 3 x 30 sec Tendon glides AROM 10 reps  Opposition to all digits  5 reps                OT Education - 05/08/18 1721    Education Details  findings of eval and HEP     Person(s) Educated   Patient    Methods  Explanation;Demonstration;Handout    Comprehension  Verbalized understanding;Returned demonstration       OT Short Term Goals - 05/08/18 1727      OT SHORT TERM GOAL #1   Title  Pain on PRWHE improve with more than 18 points     Baseline  PRWHE pain score at eval 38/50    Time  3    Period  Weeks    Status  New    Target Date  05/29/18        OT Long Term Goals - 05/08/18 1728      OT LONG TERM GOAL #1   Title  Pt to demo understanding of HEP to decrease pain , increase ROM and use of splint on R wrist to decrease pain     Baseline  no knowledge of HEP     Time  4    Period  Weeks    Status  New    Target Date  06/05/18            Plan - 05/08/18 1723    Clinical Impression Statement  Pt present at OT eval with bilateral hand and wrist pain but R worse than L - as well as R shoulder pain - pt report he was on chemo and when drugs got out of his system about 3 months ago - he had increase pain in hands again - increase stiffness - pt this date show great fisting but decrease PIP extention in all digits , decrease wrist ROM in all planes - and R worse than L - pain can be from 3/10 at rest to 10/10  at times - pt grip and 3 point grip decrease - pt can benefit from OT services     Occupational performance deficits (Please refer to evaluation for details):  IADL's;ADL's;Leisure    Rehab Potential  Fair    Current Impairments/barriers affecting progress:  chronic condition     OT Frequency  1x / week    OT Duration  4 weeks    OT Treatment/Interventions  Self-care/ADL training;Therapeutic exercise;Moist Heat;Paraffin;Ultrasound;Manual Therapy;Passive range of motion;Splinting;Patient/family education    Plan  assess progress with HEP - soft tissue mobs graston to flexors of digits and forearm     Clinical Decision Making  Several treatment options, min-mod task modification necessary    OT Home Exercise Plan  see pt instruction    Consulted and Agree  with Plan of Care  Patient       Patient will benefit from skilled therapeutic intervention  in order to improve the following deficits and impairments:  Impaired flexibility, Increased edema, Pain, Decreased range of motion, Decreased strength, Impaired UE functional use  Visit Diagnosis: Pain in right hand - Plan: Ot plan of care cert/re-cert  Pain in right wrist - Plan: Ot plan of care cert/re-cert  Pain in left hand - Plan: Ot plan of care cert/re-cert  Stiffness of right hand, not elsewhere classified - Plan: Ot plan of care cert/re-cert  Stiffness of right wrist, not elsewhere classified - Plan: Ot plan of care cert/re-cert  Stiffness of left hand, not elsewhere classified - Plan: Ot plan of care cert/re-cert    Problem List Patient Active Problem List   Diagnosis Date Noted  . Goals of care, counseling/discussion 05/02/2017  . Multiple myeloma ([place]) 04/19/2017  . Iron deficiency anemia 03/14/2017  . PAD (peripheral artery disease) ([place]) 11/23/2016  . Tobacco use disorder 11/23/2016  . AAA (abdominal aortic aneurysm) without rupture ([place]) 11/23/2016  . Acute diastolic CHF (congestive heart failure) ([place]) 08/27/2016  . AVB (atrioventricular block) 08/27/2016  . Cardiac pacemaker 08/27/2016  . Leukocytosis 08/27/2016  . Acute posthemorrhagic anemia 08/27/2016  . Iron deficiency anemia due to chronic blood loss 08/27/2016  . Acute on chronic renal insufficiency 08/27/2016  . Joint pain 08/27/2016  . Gout attack 08/27/2016  . Upper GI bleed 08/21/2016  . Allergic state 12/16/2015  . Anxiety 12/16/2015  . Atrial fibrillation ([place]) 12/16/2015  . Diabetes mellitus ([place]) 12/16/2015  . HLD (hyperlipidemia) 12/16/2015  . BP (high blood pressure) 12/16/2015  . Fatty liver disease, nonalcoholic 89/38/1017  . Apnea, sleep 12/16/2015  . Tubulovillous adenoma polyp of colon 08/19/2015  . Hernia of abdominal cavity 07/31/2015  . Malignant neoplasm of prostate ([place]) 02/20/2014   . Rheumatoid arthritis ([place]) 01/15/2014    Rosalyn Gess OTR/L,CLT 05/08/2018, 5:32 PM  [place] [facility] [address], [place], [address] Phone: [phone]   Fax:  [phone]  Name: Omar [name] MRN: [id number] Date of Birth: [birth year]

## 2018-05-08 NOTE — Patient Instructions (Signed)
Moist heat to hands and wrist am and pm AROM for wrist in all planes   stop when feeling slight pull or stretch  10 reps  wear Benik wrist splint with act that cause wrist pain   Gentle PROM for PIP extention  Hold 3 x 30 sec Tendon glides AROM 10 reps  Opposition to all digits  5 reps

## 2018-05-15 ENCOUNTER — Other Ambulatory Visit: Payer: Medicare Other

## 2018-05-15 ENCOUNTER — Inpatient Hospital Stay: Payer: Medicare Other | Attending: Oncology

## 2018-05-15 DIAGNOSIS — E119 Type 2 diabetes mellitus without complications: Secondary | ICD-10-CM | POA: Insufficient documentation

## 2018-05-15 DIAGNOSIS — Z8546 Personal history of malignant neoplasm of prostate: Secondary | ICD-10-CM | POA: Diagnosis not present

## 2018-05-15 DIAGNOSIS — Z950 Presence of cardiac pacemaker: Secondary | ICD-10-CM | POA: Diagnosis not present

## 2018-05-15 DIAGNOSIS — F17210 Nicotine dependence, cigarettes, uncomplicated: Secondary | ICD-10-CM | POA: Insufficient documentation

## 2018-05-15 DIAGNOSIS — M069 Rheumatoid arthritis, unspecified: Secondary | ICD-10-CM | POA: Diagnosis not present

## 2018-05-15 DIAGNOSIS — D631 Anemia in chronic kidney disease: Secondary | ICD-10-CM | POA: Insufficient documentation

## 2018-05-15 DIAGNOSIS — C9000 Multiple myeloma not having achieved remission: Secondary | ICD-10-CM

## 2018-05-15 DIAGNOSIS — I4891 Unspecified atrial fibrillation: Secondary | ICD-10-CM | POA: Insufficient documentation

## 2018-05-15 DIAGNOSIS — Z7984 Long term (current) use of oral hypoglycemic drugs: Secondary | ICD-10-CM | POA: Insufficient documentation

## 2018-05-15 DIAGNOSIS — Z7982 Long term (current) use of aspirin: Secondary | ICD-10-CM | POA: Insufficient documentation

## 2018-05-15 DIAGNOSIS — D509 Iron deficiency anemia, unspecified: Secondary | ICD-10-CM | POA: Diagnosis not present

## 2018-05-15 DIAGNOSIS — Z7902 Long term (current) use of antithrombotics/antiplatelets: Secondary | ICD-10-CM | POA: Diagnosis not present

## 2018-05-15 DIAGNOSIS — I10 Essential (primary) hypertension: Secondary | ICD-10-CM | POA: Insufficient documentation

## 2018-05-15 DIAGNOSIS — N183 Chronic kidney disease, stage 3 (moderate): Secondary | ICD-10-CM | POA: Insufficient documentation

## 2018-05-15 DIAGNOSIS — Z79899 Other long term (current) drug therapy: Secondary | ICD-10-CM | POA: Diagnosis not present

## 2018-05-15 LAB — COMPREHENSIVE METABOLIC PANEL
ALT: 16 U/L (ref 0–44)
ANION GAP: 8 (ref 5–15)
AST: 18 U/L (ref 15–41)
Albumin: 3.8 g/dL (ref 3.5–5.0)
Alkaline Phosphatase: 79 U/L (ref 38–126)
BILIRUBIN TOTAL: 0.5 mg/dL (ref 0.3–1.2)
BUN: 11 mg/dL (ref 8–23)
CO2: 24 mmol/L (ref 22–32)
Calcium: 9 mg/dL (ref 8.9–10.3)
Chloride: 108 mmol/L (ref 98–111)
Creatinine, Ser: 1.14 mg/dL (ref 0.61–1.24)
GFR calc Af Amer: >60 mL/min (ref >60)
GFR calc non Af Amer: >60 mL/min (ref >60)
GLUCOSE: 95 mg/dL (ref 70–99)
POTASSIUM: 4.3 mmol/L (ref 3.5–5.1)
Sodium: 140 mmol/L (ref 135–145)
TOTAL PROTEIN: 8.1 g/dL (ref 6.5–8.1)

## 2018-05-15 LAB — CBC WITH DIFFERENTIAL/PLATELET
ABS IMMATURE GRANULOCYTES: 0.05 10*3/uL (ref 0.00–0.07)
BASOS PCT: 0 %
Basophils Absolute: 0 10*3/uL (ref 0.0–0.1)
Eosinophils Absolute: 0.2 10*3/uL (ref 0.0–0.5)
Eosinophils Relative: 2 %
HCT: 43.5 % (ref 39.0–52.0)
Hemoglobin: 13.8 g/dL (ref 13.0–17.0)
IMMATURE GRANULOCYTES: 1 %
Lymphocytes Relative: 11 %
Lymphs Abs: 1.1 10*3/uL (ref 0.7–4.0)
MCH: 28.8 pg (ref 26.0–34.0)
MCHC: 31.7 g/dL (ref 30.0–36.0)
MCV: 90.6 fL (ref 80.0–100.0)
MONOS PCT: 6 %
Monocytes Absolute: 0.6 10*3/uL (ref 0.1–1.0)
NEUTROS ABS: 7.9 10*3/uL — AB (ref 1.7–7.7)
NEUTROS PCT: 80 %
PLATELETS: 270 10*3/uL (ref 150–400)
RBC: 4.8 MIL/uL (ref 4.22–5.81)
RDW: 16.4 % — ABNORMAL HIGH (ref 11.5–15.5)
WBC: 9.9 10*3/uL (ref 4.0–10.5)
nRBC: 0 % (ref 0.0–0.2)

## 2018-05-16 ENCOUNTER — Ambulatory Visit: Payer: Medicare Other | Admitting: Occupational Therapy

## 2018-05-16 DIAGNOSIS — M25631 Stiffness of right wrist, not elsewhere classified: Secondary | ICD-10-CM

## 2018-05-16 DIAGNOSIS — M79642 Pain in left hand: Secondary | ICD-10-CM

## 2018-05-16 DIAGNOSIS — M79641 Pain in right hand: Secondary | ICD-10-CM

## 2018-05-16 DIAGNOSIS — M25642 Stiffness of left hand, not elsewhere classified: Secondary | ICD-10-CM

## 2018-05-16 DIAGNOSIS — M25531 Pain in right wrist: Secondary | ICD-10-CM

## 2018-05-16 DIAGNOSIS — M25641 Stiffness of right hand, not elsewhere classified: Secondary | ICD-10-CM

## 2018-05-16 LAB — KAPPA/LAMBDA LIGHT CHAINS
Kappa free light chain: 132.9 mg/L — ABNORMAL HIGH (ref 3.3–19.4)
Kappa, lambda light chain ratio: 5.83 — ABNORMAL HIGH (ref 0.26–1.65)
Lambda free light chains: 22.8 mg/L (ref 5.7–26.3)

## 2018-05-16 NOTE — Patient Instructions (Signed)
Pt to cont with same HEP - with focus on extention of PIP PROM  Tendon glides and AROM for wrist all planes

## 2018-05-16 NOTE — Therapy (Signed)
[place] [facility] [address], [place], [address] Phone: [phone]   Fax:  [phone]  Occupational Therapy Treatment  Patient Details  Name: Omar [name] MRN: [id number] Date of Birth: May 28, 1941 Referring Provider (OT): Jefm Bryant    Encounter Date: 05/16/2018  OT End of Session - 05/16/18 1124    Visit Number  2    Number of Visits  2    Date for OT Re-Evaluation  05/16/18    OT Start Time  9379    OT Stop Time  1120    OT Time Calculation (min)  33 min    Activity Tolerance  Patient tolerated treatment well    Behavior During Therapy  [facility] for tasks assessed/performed       Past Medical History:  Diagnosis Date  . Anxiety   . Arthritis    rheumatoid  . Atrial fibrillation (HCC)    hx of  . Cancer ([place])   . Chronic kidney disease   . Colon polyp 07-07-15   TUBULAR ADENOMA WITH AT LEAST HIGH-GRADE / Dr Rayann Heman  . Diabetes mellitus without complication ([place])   . Dysrhythmia    bradycardia...Marland KitchenMarland Kitchen2 to 1 heart block  . GERD (gastroesophageal reflux disease)   . Heart murmur    patient unaware of history of murmur  . Hypercholesterolemia   . Hypertension   . Presence of permanent cardiac pacemaker   . Prostate cancer ([place]) 01/01/13, 01/30/14   Gleason 3+4=7, volume 46.6 cc  . Rheumatoid arthritis ([place])   . S/P radiation therapy  04/03/2014 through 06/04/2014                                                      Prostate 7800 cGy in 40 sessions                          . Sleep apnea    uses cpap but it is not in the hospital with him    Past Surgical History:  Procedure Laterality Date  . ABDOMINAL AORTIC ANEURYSM REPAIR  11/2013  . COLON SURGERY  March 2017   Right hemicolectomy for tubulovillous adenoma with high-grade dysplasia.  . COLONOSCOPY WITH PROPOFOL N/A 07/07/2015   Procedure: COLONOSCOPY WITH PROPOFOL;  Surgeon: Josefine Class, MD;  Location: [facility] ENDOSCOPY;  Service: Endoscopy;   Laterality: N/A;  . COLONOSCOPY WITH PROPOFOL N/A 10/28/2016   Procedure: COLONOSCOPY WITH PROPOFOL;  Surgeon: [name], MD;  Location: [facility] ENDOSCOPY;  Service: Endoscopy;  Laterality: N/A;  . ESOPHAGOGASTRODUODENOSCOPY (EGD) WITH PROPOFOL N/A 10/28/2016   Procedure: ESOPHAGOGASTRODUODENOSCOPY (EGD) WITH PROPOFOL;  Surgeon: [name], MD;  Location: [facility] ENDOSCOPY;  Service: Endoscopy;  Laterality: N/A;  . GIVENS CAPSULE STUDY N/A 07/25/2017   Procedure: GIVENS CAPSULE STUDY;  Surgeon: [name], MD;  Location: [facility] ENDOSCOPY;  Service: Gastroenterology;  Laterality: N/A;  . LAPAROSCOPIC RIGHT COLECTOMY Right 08/08/2015   Procedure: LAPAROSCOPIC RIGHT COLECTOMY;  Surgeon: Robert Bellow, MD;  Location: [facility] ORS;  Service: General;  Laterality: Right;  . NASAL SINUS SURGERY    . PACEMAKER INSERTION Left 08/26/2016   Procedure: INSERTION PACEMAKER;  Surgeon: Isaias Cowman, MD;  Location: [facility] ORS;  Service: Cardiovascular;  Laterality: Left;  . PROSTATE BIOPSY  01/01/13, 01/30/14   Gleason 3+3=6, vol  46.6 cc  . TONSILLECTOMY    . uvula surgery     for sleep apnea    There were no vitals filed for this visit.  Subjective Assessment - 05/16/18 1048    Subjective   My shoulder bother me more than my hands - I did the exercises and wrist with the soft splint feels better the pain - not really pain in my hands now - mostly stiffness    Patient Stated Goals  If I can get the pain better in my R shoulder,wrist and hand and L hand and wrist     Currently in Pain?  Yes    Pain Score  7     Pain Location  Shoulder    Pain Orientation  Right    Pain Descriptors / Indicators  Aching    Pain Type  Chronic pain    Pain Onset  More than a month ago    Pain Frequency  Constant         [facility] OT Assessment - 05/16/18 0001      AROM   Right Forearm Supination  80 Degrees    Right Wrist Extension  44 Degrees    Right Wrist Flexion  70 Degrees    Left Wrist Extension  55 Degrees       Strength   Right Hand Grip (lbs)  43    Right Hand Lateral Pinch  16 lbs    Right Hand 3 Point Pinch  11 lbs    Left Hand Grip (lbs)  44    Left Hand Lateral Pinch  14 lbs    Left Hand 3 Point Pinch  10 lbs      Right Hand AROM   R Index PIP 0-100  -15 Degrees    R Long PIP 0-100  --   -30   R Ring PIP 0-100  --   -30   R Little PIP 0-100  --   -30      Measurements taken for ROM of digits and wrist - and grip/prehension strength  -great progress   pt to progress with HEP at home -and use of Benik neoprene on R wrist to decrease pain with functional use         OT Treatments/Exercises (OP) - 05/16/18 0001      RUE Paraffin   Number Minutes Paraffin  10 Minutes    RUE Paraffin Location  Hand   wrist   Comments  prior to soft tissue mobs on volar hand       LUE Paraffin   Number Minutes Paraffin  10 Minutes    LUE Paraffin Location  --   hand , wrist   Comments  prior to soft tissue mobs to volar hand and digits        after paraffin done some soft tissue mobs to bilateral hands- MC and CP spreads , and brushing of graston tool nr 2 over volar hand and digits  With PROM and stretches to PIP extention - great progress- hard time to maintain -reinforce with pt importance to decrease flexor contractures at PIP 's  While maintaining flexion of digits    AROM for wrist in all planes - pain free  And cont to do heat prior to ROM  Pt shoulder pain also more since stop of chemo drug -harder time taking off and on jacket , sleeping on his R side and reaching over head  Will send order to Dr Jefm Bryant for PT  eval for R shoulder pain and decrease ROM  Discharge at this time from OT with HEP       OT Education - 05/16/18 1123    Education Details  HEP review     Person(s) Educated  Patient    Methods  Explanation;Demonstration;Handout    Comprehension  Verbalized understanding;Returned demonstration       OT Short Term Goals - 05/16/18 1127      OT SHORT TERM GOAL  #1   Title  Pain on PRWHE improve with more than 18 points     Baseline  PRWHE pain score at eval 38/50- pain at hand and wrist 10/50     Status  Achieved        OT Long Term Goals - 05/16/18 1128      OT LONG TERM GOAL #1   Title  Pt to demo understanding of HEP to decrease pain , increase ROM and use of splint on R wrist to decrease pain     Baseline  show progress in extentin of diigts and wrist AROM     Status  Achieved            Plan - 05/16/18 1125    Clinical Impression Statement  Pt report more stiffness in digits and wrist than pain  now - pt show increase wrist AROM , and decrease flexor contracture in PIP's - pt showed progress  with HEP - to cont with HEP  - pt report that  R shoulder pain bother him really bad -  about 7/10 per pt - and harder time taking jacket off, reach overhead and sleeping on R side- Will send order over to Dr Jefm Bryant to recommend PT eval for R shoulder increase pain and decrease ROM - pt discharge from OT services with EHP     Occupational performance deficits (Please refer to evaluation for details):  IADL's;ADL's;Leisure    Rehab Potential  Fair    Current Impairments/barriers affecting progress:  chronic condition     Plan  discharge with HEP     Clinical Decision Making  Several treatment options, min-mod task modification necessary    OT Home Exercise Plan  see pt instruction    Consulted and Agree with Plan of Care  Patient       Patient will benefit from skilled therapeutic intervention in order to improve the following deficits and impairments:  Impaired flexibility, Increased edema, Pain, Decreased range of motion, Decreased strength, Impaired UE functional use  Visit Diagnosis: Pain in right wrist  Pain in right hand  Pain in left hand  Stiffness of right hand, not elsewhere classified  Stiffness of right wrist, not elsewhere classified  Stiffness of left hand, not elsewhere classified    Problem List Patient Active  Problem List   Diagnosis Date Noted  . Goals of care, counseling/discussion 05/02/2017  . Multiple myeloma ([place]) 04/19/2017  . Iron deficiency anemia 03/14/2017  . PAD (peripheral artery disease) ([place]) 11/23/2016  . Tobacco use disorder 11/23/2016  . AAA (abdominal aortic aneurysm) without rupture ([place]) 11/23/2016  . Acute diastolic CHF (congestive heart failure) ([place]) 08/27/2016  . AVB (atrioventricular block) 08/27/2016  . Cardiac pacemaker 08/27/2016  . Leukocytosis 08/27/2016  . Acute posthemorrhagic anemia 08/27/2016  . Iron deficiency anemia due to chronic blood loss 08/27/2016  . Acute on chronic renal insufficiency 08/27/2016  . Joint pain 08/27/2016  . Gout attack 08/27/2016  . Upper GI bleed 08/21/2016  . Allergic state 12/16/2015  .  Anxiety 12/16/2015  . Atrial fibrillation ([place]) 12/16/2015  . Diabetes mellitus ([place]) 12/16/2015  . HLD (hyperlipidemia) 12/16/2015  . BP (high blood pressure) 12/16/2015  . Fatty liver disease, nonalcoholic 83/06/5994  . Apnea, sleep 12/16/2015  . Tubulovillous adenoma polyp of colon 08/19/2015  . Hernia of abdominal cavity 07/31/2015  . Malignant neoplasm of prostate ([place]) 02/20/2014  . Rheumatoid arthritis ([place]) 01/15/2014    Rosalyn Gess OTR/L,CLT 05/16/2018, 4:36 PM  [place] [facility] [address] [place], [address] Phone: [phone]   Fax:  [phone]  Name: Omar [name] MRN: [id number] Date of Birth: [birth year]

## 2018-05-17 LAB — MULTIPLE MYELOMA PANEL, SERUM
Albumin SerPl Elph-Mcnc: 3.6 g/dL (ref 2.9–4.4)
Albumin/Glob SerPl: 0.9 (ref 0.7–1.7)
Alpha 1: 0.3 g/dL (ref 0.0–0.4)
Alpha2 Glob SerPl Elph-Mcnc: 0.8 g/dL (ref 0.4–1.0)
B-Globulin SerPl Elph-Mcnc: 1.1 g/dL (ref 0.7–1.3)
GLOBULIN, TOTAL: 4.1 g/dL — AB (ref 2.2–3.9)
Gamma Glob SerPl Elph-Mcnc: 1.9 g/dL — ABNORMAL HIGH (ref 0.4–1.8)
IGG (IMMUNOGLOBIN G), SERUM: 2120 mg/dL — AB (ref 700–1600)
IgA: 232 mg/dL (ref 61–437)
IgM (Immunoglobulin M), Srm: 106 mg/dL (ref 15–143)
Total Protein ELP: 7.7 g/dL (ref 6.0–8.5)

## 2018-05-22 ENCOUNTER — Ambulatory Visit: Payer: Medicare Other | Admitting: Oncology

## 2018-05-22 ENCOUNTER — Other Ambulatory Visit: Payer: Medicare Other

## 2018-05-23 ENCOUNTER — Inpatient Hospital Stay: Payer: Medicare Other | Admitting: Oncology

## 2018-05-24 ENCOUNTER — Ambulatory Visit: Payer: Medicare Other

## 2018-05-24 DIAGNOSIS — M79641 Pain in right hand: Secondary | ICD-10-CM | POA: Diagnosis not present

## 2018-05-24 DIAGNOSIS — M25611 Stiffness of right shoulder, not elsewhere classified: Secondary | ICD-10-CM

## 2018-05-24 DIAGNOSIS — M25511 Pain in right shoulder: Principal | ICD-10-CM

## 2018-05-24 DIAGNOSIS — G8929 Other chronic pain: Secondary | ICD-10-CM

## 2018-05-24 DIAGNOSIS — M62838 Other muscle spasm: Secondary | ICD-10-CM

## 2018-05-24 NOTE — Therapy (Signed)
[facility] [place] [address] [place], [address] Phone: [phone]   Fax:  [phone]  Physical Therapy Evaluation  Patient Details  Name: Omar [name] MRN: [id number] Date of Birth: 1941/03/03 Referring Provider (PT): Percell Boston MD   Encounter Date: 05/24/2018  PT End of Session - 05/24/18 1730    Visit Number  1    Number of Visits  13    Date for PT Re-Evaluation  07/05/18    Authorization Type  1 /10 Medicare     PT Start Time  9767    PT Stop Time  1440    PT Time Calculation (min)  55 min    Activity Tolerance  Patient tolerated treatment well    Behavior During Therapy  [facility] for tasks assessed/performed       Past Medical History:  Diagnosis Date  . Anxiety   . Arthritis    rheumatoid  . Atrial fibrillation (HCC)    hx of  . Cancer ([place])   . Chronic kidney disease   . Colon polyp 07-07-15   TUBULAR ADENOMA WITH AT LEAST HIGH-GRADE / Dr Rayann Heman  . Diabetes mellitus without complication ([place])   . Dysrhythmia    bradycardia...Marland KitchenMarland Kitchen2 to 1 heart block  . GERD (gastroesophageal reflux disease)   . Heart murmur    patient unaware of history of murmur  . Hypercholesterolemia   . Hypertension   . Presence of permanent cardiac pacemaker   . Prostate cancer ([place]) 01/01/13, 01/30/14   Gleason 3+4=7, volume 46.6 cc  . Rheumatoid arthritis ([place])   . S/P radiation therapy  04/03/2014 through 06/04/2014                                                      Prostate 7800 cGy in 40 sessions                          . Sleep apnea    uses cpap but it is not in the hospital with him    Past Surgical History:  Procedure Laterality Date  . ABDOMINAL AORTIC ANEURYSM REPAIR  11/2013  . COLON SURGERY  March 2017   Right hemicolectomy for tubulovillous adenoma with high-grade dysplasia.  . COLONOSCOPY WITH PROPOFOL N/A 07/07/2015   Procedure: COLONOSCOPY WITH PROPOFOL;  Surgeon: Josefine Class, MD;   Location[facility] ENDOSCOPY;  Service: Endoscopy;  Laterality: N/A;  . COLONOSCOPY WITH PROPOFOL N/A 10/28/2016   Procedure: COLONOSCOPY WITH PROPOFOL;  Surgeon: [name], MD;  Location: [facility] ENDOSCOPY;  Service: Endoscopy;  Laterality: N/A;  . ESOPHAGOGASTRODUODENOSCOPY (EGD) WITH PROPOFOL N/A 10/28/2016   Procedure: ESOPHAGOGASTRODUODENOSCOPY (EGD) WITH PROPOFOL;  Surgeon: [name], MD;  Location[facility] ENDOSCOPY;  Service: Endoscopy;  Laterality: N/A;  . GIVENS CAPSULE STUDY N/A 07/25/2017   Procedure: GIVENS CAPSULE STUDY;  Surgeon: [name], MD;  Location: [facility] ENDOSCOPY;  Service: Gastroenterology;  Laterality: N/A;  . LAPAROSCOPIC RIGHT COLECTOMY Right 08/08/2015   Procedure: LAPAROSCOPIC RIGHT COLECTOMY;  Surgeon: Robert Bellow, MD;  Location: [facility] ORS;  Service: General;  Laterality: Right;  . NASAL SINUS SURGERY    . PACEMAKER INSERTION Left 08/26/2016   Procedure: INSERTION PACEMAKER;  Surgeon: Isaias Cowman, MD;  Location: [facility] ORS;  Service: Cardiovascular;  Laterality: Left;  .  PROSTATE BIOPSY  01/01/13, 01/30/14   Gleason 3+3=6, vol 46.6 cc  . TONSILLECTOMY    . uvula surgery     for sleep apnea    There were no vitals filed for this visit.   Subjective Assessment - 05/24/18 1405    Subjective  Patient demonstrates increased R shoulder pain most notebaly with raising his arm overhead and laying on his affected shoulder. Patient states he has chest pain with raising his arm overhead and breathing in. Patient reports his pain has been gradually getting worse for the past 2-3 months. Patient states he often has difficulty with sleeping and reports he feels like the shoulder keeps him up at night seoncdary to increased pain. Patient states he would like to be able to raise his arm overhead without increased difficulty and pain.      Pertinent History  History multiple myleoma and prostate CA    Limitations  Lifting    Diagnostic tests  None    Patient Stated Goals  To  decrease pain with movement    Currently in Pain?  Yes    Pain Score  1    worst: 8/10 ; best: 0/10   Pain Location  Shoulder    Pain Orientation  Right    Pain Descriptors / Indicators  Aching    Pain Type  Chronic pain    Pain Onset  More than a month ago    Pain Frequency  Constant         [facility] PT Assessment - 05/24/18 1357      Assessment   Medical Diagnosis  R shoulder pain    Referring Provider (PT)  Percell Boston MD    Onset Date/Surgical Date  03/07/18    Hand Dominance  Right    Next MD Visit  unknown    Prior Therapy  yes       Balance Screen   Has the patient fallen in the past 6 months  No    Has the patient had a decrease in activity level because of a fear of falling?   No    Is the patient reluctant to leave their home because of a fear of falling?   No      Home Film/video editor residence      Prior Function   Level of Independence  Independent    Vocation  Retired    Biomedical scientist  N/A    Leisure  retired Training and development officer , likes to Oceanographer,       Cognition   Overall Cognitive Status  Within Functional Limits for tasks assessed      Sensation   Light Touch  Appears Intact      Functional Tests   Functional tests  Other      Other:   Other/ Comments  Lifting arm in scaption: increased pain      Posture/Postural Control   Posture Comments  Increased thoracic kyphosis, forward rounded shoulders, FHP      ROM / Strength   AROM / PROM / Strength  AROM;Strength      AROM   AROM Assessment Site  Thoracic;Shoulder    Right/Left Shoulder  Left;Right    Right Shoulder Flexion  90 Degrees   Increased pain   Right Shoulder ABduction  90 Degrees   Increased pain   Right Shoulder Internal Rotation  40 Degrees   Increased pain   Right Shoulder External Rotation  65 Degrees  Increased pain   Left Shoulder Flexion  155 Degrees    Left Shoulder ABduction  155 Degrees    Left Shoulder Internal Rotation  60 Degrees    Left  Shoulder External Rotation  80 Degrees    Thoracic Flexion  WNL    Thoracic Extension  80% limited      Strength   Strength Assessment Site  Shoulder    Right/Left Shoulder  Right;Left    Right Shoulder Flexion  4-/5   Increased pain   Right Shoulder ABduction  4-/5   Increased pain   Right Shoulder Internal Rotation  4-/5   Increased pain   Right Shoulder External Rotation  4-/5   Increased pain   Left Shoulder Flexion  5/5    Left Shoulder ABduction  5/5    Left Shoulder Internal Rotation  5/5    Left Shoulder External Rotation  5/5      Palpation   Palpation comment  Increased TTP: along supraspinatus, pec major, infraspinatus, UT      Special Tests    Special Tests  Rotator Cuff Impingement    Rotator Cuff Impingment tests  Michel Bickers test      Hawkins-Kennedy test   Findings  Positive    Side  Right    Comments  pain       Objective measurements completed on examination: See above findings.    TREATMENT: Therapeutic Exercise: Thoracic extension in sitting -- x 10 Scapular retraction -- x 10  Pec stretch -- x 10 with 5 sec holds  Shoulder flexion in sitting -- x 10   Patient demonstrates decreased pain at the end of the session     PT Education - 05/24/18 1730    Education Details  HEP: Shoulder flexion, scapular retraction, pec stretch; POC    Person(s) Educated  Patient    Methods  Explanation;Demonstration;Handout    Comprehension  Verbalized understanding;Returned demonstration          PT Long Term Goals - 05/24/18 1742      PT LONG TERM GOAL #1   Title  Patient will be independent with HEP to continue benefits of therapy after discharge.     Baseline  Dependent with HEP    Time  6    Period  Weeks    Status  New    Target Date  07/05/18      PT LONG TERM GOAL #2   Title  Patient will be able to perform full shoulder flexion AROM (equal to the L UE) without pain to better be able to perform functional lifting activities such as  reaching a top shelf.     Baseline  90 degress flexion increased pain    Time  6    Period  Weeks    Status  New    Target Date  07/05/18      PT LONG TERM GOAL #3   Title  Patient will have a worst pain of 3/10 of shoulder pain to increase comfort and allow for greater amount of sleep.     Baseline  8/10 shoulder pain    Time  6    Period  Weeks    Status  New    Target Date  07/05/18      PT LONG TERM GOAL #4   Title  Patient will be able to sleep on his R shoulder to allow for greater comfort with sleep and improvement with tissue healing.    Baseline  Increased pain when sleeping on R UE    Time  6    Period  Weeks    Status  New    Target Date  07/05/18             Plan - 05/24/18 1733    Clinical Impression Statement  Patient is a 77 yo right hand dominant male presenting with increased pain along his Rt shoulder. Patient demonstrates increased R shoulder dysfunction as indicated by increased pain with raising his arm overhead and increased pain with shoulder ER/IR. Patient also demosntrates decreased strength and muscular coordination with all movement and poor postural control. Patient will benefit from further skilled therapy to reutrn to prior level of function.     History and Personal Factors relevant to plan of care:  CA x 2    Clinical Presentation  Evolving    Clinical Presentation due to:  Patients pain is worsening    Clinical Decision Making  Moderate    Rehab Potential  Good    Clinical Impairments Affecting Rehab Potential  (+) highly motivated (-) history of CA    PT Frequency  2x / week    PT Duration  6 weeks    PT Treatment/Interventions  Cryotherapy;Moist Heat;Ultrasound;Electrical Stimulation;Therapeutic exercise;Therapeutic activities;Iontophoresis 64m/ml Dexamethasone;Passive range of motion;Manual techniques;Patient/family education;Neuromuscular re-education;Dry needling;Spinal Manipulations;Joint Manipulations    PT Next Visit Plan  continue  mobility and strength    PT Home Exercise Plan  See education section    Consulted and Agree with Plan of Care  Patient       Patient will benefit from skilled therapeutic intervention in order to improve the following deficits and impairments:  Decreased balance, Increased muscle spasms, Decreased endurance, Decreased range of motion, Improper body mechanics, Pain, Postural dysfunction, Decreased strength, Decreased safety awareness, Decreased coordination, Decreased activity tolerance  Visit Diagnosis: Chronic right shoulder pain  Stiffness of right shoulder, not elsewhere classified  Other muscle spasm     Problem List Patient Active Problem List   Diagnosis Date Noted  . Goals of care, counseling/discussion 05/02/2017  . Multiple myeloma ([place] 04/19/2017  . Iron deficiency anemia 03/14/2017  . PAD (peripheral artery disease) ([place] 11/23/2016  . Tobacco use disorder 11/23/2016  . AAA (abdominal aortic aneurysm) without rupture ([place] 11/23/2016  . Acute diastolic CHF (congestive heart failure) ([place] 08/27/2016  . AVB (atrioventricular block) 08/27/2016  . Cardiac pacemaker 08/27/2016  . Leukocytosis 08/27/2016  . Acute posthemorrhagic anemia 08/27/2016  . Iron deficiency anemia due to chronic blood loss 08/27/2016  . Acute on chronic renal insufficiency 08/27/2016  . Joint pain 08/27/2016  . Gout attack 08/27/2016  . Upper GI bleed 08/21/2016  . Allergic state 12/16/2015  . Anxiety 12/16/2015  . Atrial fibrillation ([place] 12/16/2015  . Diabetes mellitus ([place] 12/16/2015  . HLD (hyperlipidemia) 12/16/2015  . BP (high blood pressure) 12/16/2015  . Fatty liver disease, nonalcoholic 056/97/9480 . Apnea, sleep 12/16/2015  . Tubulovillous adenoma polyp of colon 08/19/2015  . Hernia of abdominal cavity 07/31/2015  . Malignant neoplasm of prostate ([place] 02/20/2014  . Rheumatoid arthritis ([place] 01/15/2014    WBlythe Stanford PT DPT 05/24/2018, 6:00 PM  [facility] [address] [place] [address]Phone: [phone]  Fax:  [phone] Name: JKarlis CreggMRN: [id number] of Birth: [birth year]

## 2018-05-26 ENCOUNTER — Other Ambulatory Visit: Payer: Self-pay

## 2018-05-26 ENCOUNTER — Inpatient Hospital Stay (HOSPITAL_BASED_OUTPATIENT_CLINIC_OR_DEPARTMENT_OTHER): Payer: Medicare Other | Admitting: Oncology

## 2018-05-26 ENCOUNTER — Encounter: Payer: Self-pay | Admitting: Oncology

## 2018-05-26 VITALS — BP 113/68 | HR 77 | Temp 97.3°F | Resp 18 | Wt 186.7 lb

## 2018-05-26 DIAGNOSIS — F17210 Nicotine dependence, cigarettes, uncomplicated: Secondary | ICD-10-CM | POA: Diagnosis not present

## 2018-05-26 DIAGNOSIS — Z7982 Long term (current) use of aspirin: Secondary | ICD-10-CM

## 2018-05-26 DIAGNOSIS — E119 Type 2 diabetes mellitus without complications: Secondary | ICD-10-CM

## 2018-05-26 DIAGNOSIS — C9000 Multiple myeloma not having achieved remission: Secondary | ICD-10-CM

## 2018-05-26 DIAGNOSIS — Z7902 Long term (current) use of antithrombotics/antiplatelets: Secondary | ICD-10-CM

## 2018-05-26 DIAGNOSIS — M069 Rheumatoid arthritis, unspecified: Secondary | ICD-10-CM

## 2018-05-26 DIAGNOSIS — D631 Anemia in chronic kidney disease: Secondary | ICD-10-CM

## 2018-05-26 DIAGNOSIS — Z79899 Other long term (current) drug therapy: Secondary | ICD-10-CM

## 2018-05-26 DIAGNOSIS — I10 Essential (primary) hypertension: Secondary | ICD-10-CM

## 2018-05-26 DIAGNOSIS — D509 Iron deficiency anemia, unspecified: Secondary | ICD-10-CM

## 2018-05-26 DIAGNOSIS — Z7984 Long term (current) use of oral hypoglycemic drugs: Secondary | ICD-10-CM

## 2018-05-26 DIAGNOSIS — Z950 Presence of cardiac pacemaker: Secondary | ICD-10-CM

## 2018-05-26 DIAGNOSIS — Z8546 Personal history of malignant neoplasm of prostate: Secondary | ICD-10-CM

## 2018-05-26 DIAGNOSIS — N183 Chronic kidney disease, stage 3 (moderate): Secondary | ICD-10-CM | POA: Diagnosis not present

## 2018-05-26 DIAGNOSIS — I4891 Unspecified atrial fibrillation: Secondary | ICD-10-CM

## 2018-05-26 NOTE — Progress Notes (Signed)
Patient here for follow up

## 2018-05-27 NOTE — Progress Notes (Signed)
Hematology/Oncology Follow Up Note [facility] [phone] [phone]) [phone]  Patient Care Team: Jodi Marble, MD as PCP - General (Internal Medicine) Bary Castilla, Forest Gleason, MD (General Surgery) Edrick Kins, MD as Rounding Team (Internal Medicine) Hillary Bow, MD as Consulting Physician (Internal Medicine) [name], MD as Surgeon (Gastroenterology) Isaias Cowman, MD as Consulting Physician (Cardiology)  REFERRING PROVIDER: Dr.Lateef, [place]  REASON FOR VISIT Follow up for management of plasma cell dyscrasia.   HISTORY OF PRESENTING ILLNESS:  77 y.o.  male with PMH listed below who presents to follow up on the evaluation and management of his abnormal urine protein electrophoresis results. I reviewed the records from [facility], [place] and [place] was performed and HemOnc related medical problems are listed below.  He lives with a friend. He has 3 adult children.   1 Chronic Kidney disease, Stage III: patient follows up with Dr.Lateef.  2 Proteinuria:  02/18/2017: Albumin/Creatinine ratio was 7, Urine protein electrophoresis,Random Urine revealed M Spike 43.2%, total protein of 43.34m/dl,  01/05/2017 Albumin/Creatinine ratio was 455.2, Albumin 996.5, , 2.15.2018 Albumin/Creatinine ratio was 80.3, Albumin 73.3, Urine protein electrophoresis,Random Urine revealed M Spike 29.7%, total protein of 29.154mdl, 06/30/2016 Serum protein electrophoresis did not detect M spike.  Autoimmune disorder work up showed Negative anti dsDNA, RNP antibodies, smith antibody, Sjogren antibodies, anti Jo-1, positive antichromotin antibodies, positive ANA,  3 Anemia of chronic kidney disease:  4 Iron deficiency anemia: history of iron deficiency, ferritin was 7 on 08/11/2016. He had EGD and colonoscopy that were done this year which showed duodenitis/esphagitis/diverticulosis/benigh polyps. 5 Rheumatoid  arthritis: he follows up with Dr.Kernodle and is on chronic MTX. He Is currently off MTX reports joint pains are controlled, not quite symptomatic.   Patient denies any persistent bone pain or any pain. He continue to feel lack of energy, persistent, not improved with resting or taking naps. He also has lost 25 pounds in the past year which was unintentional.   # Rheumatoid arthritis and currently is off MTX.  # It is ambiguous whether he has truly symptomatic multiple myeloma or a smoldering myeloma, given that anemia can be secondary to iron deficiency and CKD.  The hypermetabolic 2.1 cm hypermetabolic soft tissue lesion in the left heel, can reflect a Plasmacytoma,which should have been biopsied. However patient initially denies being on any blood thinner, later he was found to be on Plavix with pre biopsy questionnaire screening, and biopsy was held. Patient has to contact cardiologist to hold plavix for seven days before procedure. He feels frustrated about multiple workup and meanwhile he becomes more fatigues, with worsening of kidney function. Patient was reluctant about proceeding additional invasive procedures and wants to start treatment. Decision was made to start active MM treatment.   # [facility] bone marrow transplant. He is not considered to  good candidate for transplant. Patient cardiologist Dr. KaChancy Milroyad started patient on Lasix 20 mg daily. [facility] as patient was having dizziness and borderline blood pressure during his clinic visit there.  # [facility]  Current Treatment:  S/p RVD x 4 cycles tolerates well except cytopenia.  Revlimid 10 mg PO once per day on days 1 to 14 (dose reduced due to GFR) Revlimid renal dosing of 1025m Velcade 1.3 mg/m2 IV once per day on days 1,  8, 15 Dexamethasone 20  mg PO once per day on days 1, 8, 15 given patient's  diabetes Given patient's multiple comorbidity and the side effects of cytopenia during his RVD treatment and the uncertainty of this is really a site of plasmacytoma or not, I recommend not to continue him on maintenance Revlimid.   INTERVAL HISTORY Patient presents for follow-up of management of multiple myeloma/smoldering multiple myeloma. Not on maintenance treatment.  Reports feeling at baseline.  Denies any fatigue, weight loss, bone pain, chills, night sweats.  Chronic SOB with exertion has improved.   chronic joint pain due to rheumatoid arthritis,on plaquenil. .   Review of Systems  Constitutional: Negative for appetite change, chills, fatigue, fever and unexpected weight change.  HENT:   Negative for hearing loss and voice change.   Eyes: Negative for eye problems and icterus.  Respiratory: Negative for chest tightness, cough and shortness of breath.   Cardiovascular: Negative for chest pain and leg swelling.  Gastrointestinal: Negative for abdominal distention and abdominal pain.  Endocrine: Negative for hot flashes.  Genitourinary: Negative for difficulty urinating, dysuria and frequency.   Musculoskeletal: Negative for arthralgias.  Skin: Negative for itching and rash.  Neurological: Negative for light-headedness and numbness.  Hematological: Negative for adenopathy. Does not bruise/bleed easily.  Psychiatric/Behavioral: Negative for confusion.     MEDICAL HISTORY:  Past Medical History:  Diagnosis Date  . Anxiety   . Arthritis    rheumatoid  . Atrial fibrillation (HCC)    hx of  . Cancer ([place])   . Chronic kidney disease   . Colon polyp 07-07-15   TUBULAR ADENOMA WITH AT LEAST HIGH-GRADE / Dr Rayann Heman  . Diabetes mellitus without complication ([place])   . Dysrhythmia    bradycardia...Marland KitchenMarland Kitchen2 to 1 heart block  . GERD (gastroesophageal reflux disease)   . Heart murmur    patient unaware of history of murmur  . Hypercholesterolemia   . Hypertension   . Presence of  permanent cardiac pacemaker   . Prostate cancer ([place]) 01/01/13, 01/30/14   Gleason 3+4=7, volume 46.6 cc  . Rheumatoid arthritis ([place])   . S/P radiation therapy  04/03/2014 through 06/04/2014                                                      Prostate 7800 cGy in 40 sessions                          . Sleep apnea    uses cpap but it is not in the hospital with him    SURGICAL HISTORY: Past Surgical History:  Procedure Laterality Date  . ABDOMINAL AORTIC ANEURYSM REPAIR  11/2013  . COLON SURGERY  March 2017   Right hemicolectomy for tubulovillous adenoma with high-grade dysplasia.  . COLONOSCOPY WITH PROPOFOL N/A 07/07/2015   Procedure: COLONOSCOPY WITH PROPOFOL;  Surgeon: Josefine Class, MD;  Location: [facility] ENDOSCOPY;  Service: Endoscopy;  Laterality: N/A;  . COLONOSCOPY WITH PROPOFOL N/A 10/28/2016   Procedure: COLONOSCOPY WITH PROPOFOL;  Surgeon: [name], MD;  Location: [facility] ENDOSCOPY;  Service: Endoscopy;  Laterality: N/A;  . ESOPHAGOGASTRODUODENOSCOPY (EGD) WITH PROPOFOL N/A 10/28/2016   Procedure: ESOPHAGOGASTRODUODENOSCOPY (EGD) WITH PROPOFOL;  Surgeon: [name], MD;  Location: [facility] ENDOSCOPY;  Service: Endoscopy;  Laterality: N/A;  . GIVENS CAPSULE STUDY N/A 07/25/2017  Procedure: GIVENS CAPSULE STUDY;  Surgeon: [name], MD;  [facility] ENDOSCOPY;  Service: Gastroenterology;  Laterality: N/A;  . LAPAROSCOPIC RIGHT COLECTOMY Right 08/08/2015   Procedure: LAPAROSCOPIC RIGHT COLECTOMY;  Surgeon: Robert Bellow, MD;  Location: [facility] ORS;  Service: General;  Laterality: Right;  . NASAL SINUS SURGERY    . PACEMAKER INSERTION Left 08/26/2016   Procedure: INSERTION PACEMAKER;  Surgeon: Isaias Cowman, MD;  Location: [facility] ORS;  Service: Cardiovascular;  Laterality: Left;  . PROSTATE BIOPSY  01/01/13, 01/30/14   Gleason 3+3=6, vol 46.6 cc  . TONSILLECTOMY    . uvula surgery     for sleep apnea    SOCIAL HISTORY: Social History   Socioeconomic History  . Marital  status: Divorced    Spouse name: Not on file  . Number of children: Not on file  . Years of education: Not on file  . Highest education level: Not on file  Occupational History  . Not on file  Social Needs  . Financial resource strain: Not on file  . Food insecurity:    Worry: Not on file    Inability: Not on file  . Transportation needs:    Medical: Not on file    Non-medical: Not on file  Tobacco Use  . Smoking status: Current Every Day Smoker    Packs/day: 0.50    Years: 50.00    Pack years: 25.00    Types: Cigarettes  . Smokeless tobacco: Never Used  . Tobacco comment: DOWN TO 1/2 PPD  Substance and Sexual Activity  . Alcohol use: Yes    Alcohol/week: 1.0 standard drinks    Types: 1 Cans of beer per week    Comment: occasionally  . Drug use: No  . Sexual activity: Not Currently  Lifestyle  . Physical activity:    Days per week: Not on file    Minutes per session: Not on file  . Stress: Not on file  Relationships  . Social connections:    Talks on phone: Not on file    Gets together: Not on file    Attends religious service: Not on file    Active member of club or organization: Not on file    Attends meetings of clubs or organizations: Not on file    Relationship status: Not on file  . Intimate partner violence:    Fear of current or ex partner: Not on file    Emotionally abused: Not on file    Physically abused: Not on file    Forced sexual activity: Not on file  Other Topics Concern  . Not on file  Social History Narrative  . Not on file    FAMILY HISTORY: Family History  Problem Relation Age of Onset  . Heart attack Mother   . Cirrhosis Father   . Pancreatic cancer Brother   . Diabetes Brother   . Diabetes Daughter        medication induced for cancer treatments  . Cancer Daughter        breast, brain    ALLERGIES:  has No Known Allergies.  MEDICATIONS:  Current Outpatient Medications  Medication Sig Dispense Refill  . acetaminophen  (TYLENOL) 325 MG tablet Take 325 mg by mouth every 6 (six) hours as needed.    . ALPRAZolam (XANAX) 0.25 MG tablet Take by mouth 2 (two) times daily as needed.     Marland Kitchen amLODipine-benazepril (LOTREL) 5-20 MG capsule Take 1 capsule by mouth daily.    Marland Kitchen aspirin  81 MG chewable tablet Chew 81 mg by mouth daily.    Marland Kitchen atorvastatin (LIPITOR) 80 MG tablet Take 80 mg by mouth at bedtime.     . Butalbital-APAP-Caffeine (FIORICET) 50-300-40 MG CAPS Take 1 capsule every four to six hours as needed    . Cholecalciferol (D3-50) 50000 units capsule Take 50,000 Units by mouth once a week.     . fexofenadine (ALLEGRA) 180 MG tablet Take 180 mg by mouth daily.    . fluticasone (FLONASE) 50 MCG/ACT nasal spray Place 2 sprays into both nostrils daily as needed for rhinitis.     . Fluticasone Furoate-Vilanterol (BREO ELLIPTA) 100-25 MCG/INH AEPB Inhale 1 puff into the lungs daily.     Marland Kitchen glucose blood (ONE TOUCH ULTRA TEST) test strip     . hydroxychloroquine (PLAQUENIL) 200 MG tablet Take 400 mg by mouth daily.  2  . isosorbide mononitrate (IMDUR) 30 MG 24 hr tablet Take 30 mg by mouth daily.    . metFORMIN (GLUCOPHAGE) 500 MG tablet Take 500 mg by mouth daily with breakfast.     . metoprolol succinate (TOPROL-XL) 50 MG 24 hr tablet Take 50 mg by mouth daily. Take with or immediately following a meal.    . pantoprazole (PROTONIX) 40 MG tablet Take 1 tablet (40 mg total) by mouth 2 (two) times daily. 60 tablet 3  . triamcinolone cream (KENALOG) 0.1 % Apply 1 application topically 2 (two) times daily.    . clopidogrel (PLAVIX) 75 MG tablet Take by mouth.    . colchicine 0.6 MG tablet     . folic acid (FOLVITE) 1 MG tablet Take by mouth.    . furosemide (LASIX) 20 MG tablet Take 20 mg by mouth daily.    Marland Kitchen gabapentin (NEURONTIN) 300 MG capsule Take 300 mg by mouth 3 (three) times daily.  2  . loperamide (IMODIUM) 2 MG capsule Take 1 capsule (2 mg total) by mouth See admin instructions. With onset of diarrhea, take 43m  followed by 273mevery 2 hours resolved. Maximum: 16 mg/day (Patient not taking: Reported on 05/26/2018) 120 capsule 0  . ondansetron (ZOFRAN) 8 MG tablet Take 1 tablet (8 mg total) 2 (two) times daily as needed by mouth (Nausea or vomiting). (Patient not taking: Reported on 02/20/2018) 30 tablet 1   No current facility-administered medications for this visit.       . Marland KitchenPHYSICAL EXAMINATION: ECOG PERFORMANCE STATUS: 1 - Symptomatic but completely ambulatory Vitals:   05/26/18 1500  BP: 113/68  Pulse: 77  Resp: 18  Temp: (!) 97.3 F (36.3 C)   Filed Weights   05/26/18 1500  Weight: 186 lb 11.2 oz (84.7 kg)   Physical Exam  Constitutional: He is oriented to person, place, and time and well-developed, well-nourished, and in no distress. No distress.  HENT:  Head: Normocephalic and atraumatic.  Nose: Nose normal.  Mouth/Throat: Oropharynx is clear and moist. No oropharyngeal exudate.  Eyes: Pupils are equal, round, and reactive to light. EOM are normal. Right eye exhibits no discharge. Left eye exhibits no discharge. No scleral icterus.  Neck: Normal range of motion. Neck supple. No JVD present.  Cardiovascular: Normal rate, regular rhythm and normal heart sounds. Exam reveals no friction rub.  No murmur heard. Pulmonary/Chest: Effort normal and breath sounds normal. No respiratory distress. He has no wheezes. He has no rales. He exhibits no tenderness.  Abdominal: Soft. Bowel sounds are normal. He exhibits no distension and no mass. There is no abdominal tenderness.  There is no rebound.  Musculoskeletal: Normal range of motion.        General: No tenderness or edema.  Lymphadenopathy:    He has no cervical adenopathy.  Neurological: He is alert and oriented to person, place, and time. No cranial nerve deficit. He exhibits normal muscle tone. Coordination normal.  Skin: Skin is warm and dry. No rash noted. He is not diaphoretic. No erythema.  Psychiatric: Affect and judgment normal.     LABORATORY DATA:  I have reviewed the data as listed Lab Results  Component Value Date   WBC 9.9 05/15/2018   HGB 13.8 05/15/2018   HCT 43.5 05/15/2018   MCV 90.6 05/15/2018   PLT 270 05/15/2018   Recent Labs    12/12/17 1600 02/13/18 1147 05/15/18 0925  NA 138 138 140  K 4.2 4.5 4.3  CL 108 105 108  CO2 _0 GLUCOSE 103* 98 95  BUN _1 CREATININE 1.27* 1.24 1.14  CALCIUM 9.6 9.2 9.0  GFRNONAA 54* 55* >60  GFRAA >60 >60 >60  PROT 8.1 8.3* 8.1  ALBUMIN 3.7 3.6 3.8  AST _2 ALT _3 ALKPHOS 89 66 79  BILITOT 0.3 0.6 0.5    SPEP: no M spike Serum free light chain Kappa/Lamda ratio 13.04 UPEP  Free light chain Kappa/lamda ratio 162.43, M spike 123m/24 hour   Bone marrow biopsy 03/21/2017  Bone Marrow, Aspirate,Biopsy, and Clot, left iliac - HYPERCELLULAR BONE MARROW FOR AGE WITH TRILINEAGE HEMATOPOIESIS. - PLASMACYTOSIS (PLASMA CELLS 8%)  Karyotype: loss of Y chromosome Cytogenetic MDS FISH Panel negative.   Bone marrow 04/06/2017  Bone Marrow, Aspirate,Biopsy, and Clot, right iliac and core - HYPERCELLULAR BONE MARROW FOR AGE WITH PLASMA CELL NEOPLASM - TRILINEAGE HEMATOPOIESIS. - SEE COMMENT. PERIPHERAL BLOOD: - MICROCYTIC-HYPOCHROMIC ANEMIA. Diagnosis Note The bone marrow is hypercellular with trilineage hematopoiesis but with relative abundance of erythroid precursors and increased number of megakaryocytes with nonspecific changes. Significant dyspoiesis is not seen. Iron stores are present with no ring sideroblasts. The plasma cells are increased in number representing 8% of all cells in the aspirate although focal areas in the core biopsy show 10 to 20% as primarily seen by CD138 stain. In situ hybridization for kappa and lambda light chains show kappa light chain restriction consistent with plasma cell neoplasm. Correlation with cytogenetic and FISH studies is recommended. (BNS:ecj/gt 04/07/2017)  IMAGE STUDIES I have  personally reviewed below image results.  03/15/2017 DG bone survey Met: Nonspecific lucent lesion in the distal right ulna measuring 15-16 mm, but otherwise normal bone mineralization for age throughout the visible skeleton. A solitary lytic lesion in a distal extremity would be an unusual presentation of multiple myeloma, and I favor the distal right ulna lesion is benign. Recommend correlation with serum and urine protein electrophoresis.  04/14/2017 PET scan: 1. 2.1 cm hypermetabolic soft tissue lesion in the left heel, adjacent to the calcaneal tuberosity. 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 2. Mottled FDG accumulation diffusely in the marrow space without other frankly overt hypermetabolic bony lesion. 3. Coronary artery and thoracoabdominal aortic atherosclerosis with abdominal aortic stent graft visualized in situ. 4. Bilateral renal cysts and bilateral nonobstructing renal stones.  08/24/2017 PET scan 1. No hypermetabolic osseous lesions. No definite signs of multiple myeloma on CT imaging. 2. Interval improvement in soft tissue activity along the medial aspect of the left calcaneal tuberosity, likely plantar fasciitis. 3. New small left pleural effusion. 4. Bilateral inguinal  hernias containing small bowel on the right and sigmoid colon on the left. No evidence of incarceration or obstruction. 5. Otherwise stable incidental findings including diffuse atherosclerosis, bilateral renal cysts, emphysema and sigmoid diverticulosis.  ASSESSMENT & PLAN:  1. Multiple myeloma, remission status unspecified ([place])    # Plasma cell dyscrasia. He at least have smoldering multiple myeloma, or active myeloma if the hypermetabolic lesion on PET is  Related to MM. Labs are reviewed and discussed with patient. No M spikes observed. Free light Chain stable. No new bone pain.  Hemoglobin has been stable.  # chronic renal insufficiency stage 3, kidney function fluctuates.  Follow up with nephrology.    # continue Aspirin 80m and Plavix, close monitor platelet coutns.   Follow up in 4 months repeat cbc.,cmp and MM labs.  Total face to face encounter time for this patient visit was 15 min. >50% of the time was  spent in counseling and coordination of care.   ZEarlie Server MD, PhD Hematology Oncology [facility] SP[facility]- [phone]/21/2019

## 2018-05-29 ENCOUNTER — Ambulatory Visit: Payer: Medicare Other | Admitting: Oncology

## 2018-05-30 ENCOUNTER — Encounter (INDEPENDENT_AMBULATORY_CARE_PROVIDER_SITE_OTHER): Payer: Medicare Other

## 2018-05-30 ENCOUNTER — Encounter

## 2018-05-30 ENCOUNTER — Other Ambulatory Visit (INDEPENDENT_AMBULATORY_CARE_PROVIDER_SITE_OTHER): Payer: Medicare Other

## 2018-05-30 ENCOUNTER — Ambulatory Visit (INDEPENDENT_AMBULATORY_CARE_PROVIDER_SITE_OTHER): Payer: Medicare Other | Admitting: Vascular Surgery

## 2018-06-05 ENCOUNTER — Ambulatory Visit: Payer: Medicare Other

## 2018-06-05 DIAGNOSIS — G8929 Other chronic pain: Secondary | ICD-10-CM

## 2018-06-05 DIAGNOSIS — M25511 Pain in right shoulder: Principal | ICD-10-CM

## 2018-06-05 DIAGNOSIS — M62838 Other muscle spasm: Secondary | ICD-10-CM

## 2018-06-05 DIAGNOSIS — M25611 Stiffness of right shoulder, not elsewhere classified: Secondary | ICD-10-CM

## 2018-06-05 DIAGNOSIS — M79641 Pain in right hand: Secondary | ICD-10-CM | POA: Diagnosis not present

## 2018-06-05 NOTE — Therapy (Signed)
[facility] [address] [place], [address] Phone: [phone]   Fax:  [phone]  Physical Therapy Treatment  Patient Details  Name: Omar [name] MRN: [id number] Date of Birth: [birth year] Referring Provider (PT): Percell Boston MD   Encounter Date: 06/05/2018  PT End of Session - 06/05/18 1131    Visit Number  2    Number of Visits  13    Date for PT Re-Evaluation  07/05/18    Authorization Type  2 /10 Medicare     PT Start Time  1115    PT Stop Time  1200    PT Time Calculation (min)  45 min    Activity Tolerance  Patient tolerated treatment well    Behavior During Therapy  [facility] for tasks assessed/performed       Past Medical History:  Diagnosis Date  . Anxiety   . Arthritis    rheumatoid  . Atrial fibrillation (HCC)    hx of  . Cancer ([place])   . Chronic kidney disease   . Colon polyp 07-07-15   TUBULAR ADENOMA WITH AT LEAST HIGH-GRADE / Dr Rayann Heman  . Diabetes mellitus without complication ([place])   . Dysrhythmia    bradycardia...Marland KitchenMarland Kitchen2 to 1 heart block  . GERD (gastroesophageal reflux disease)   . Heart murmur    patient unaware of history of murmur  . Hypercholesterolemia   . Hypertension   . Presence of permanent cardiac pacemaker   . Prostate cancer ([place]) 01/01/13, 01/30/14   Gleason 3+4=7, volume 46.6 cc  . Rheumatoid arthritis ([place])   . S/P radiation therapy  04/03/2014 through 06/04/2014                                                      Prostate 7800 cGy in 40 sessions                          . Sleep apnea    uses cpap but it is not in the hospital with him    Past Surgical History:  Procedure Laterality Date  . ABDOMINAL AORTIC ANEURYSM REPAIR  11/2013  . COLON SURGERY  March 2017   Right hemicolectomy for tubulovillous adenoma with high-grade dysplasia.  . COLONOSCOPY WITH PROPOFOL N/A 07/07/2015   Procedure: COLONOSCOPY WITH PROPOFOL;  Surgeon: Josefine Class, MD;   Location: [facility] ENDOSCOPY;  Service: Endoscopy;  Laterality: N/A;  . COLONOSCOPY WITH PROPOFOL N/A 10/28/2016   Procedure: COLONOSCOPY WITH PROPOFOL;  Surgeon: [name], MD;  Location: [facility] ENDOSCOPY;  Service: Endoscopy;  Laterality: N/A;  . ESOPHAGOGASTRODUODENOSCOPY (EGD) WITH PROPOFOL N/A 10/28/2016   Procedure: ESOPHAGOGASTRODUODENOSCOPY (EGD) WITH PROPOFOL;  Surgeon: [name], MD;  Location: [facility] ENDOSCOPY;  Service: Endoscopy;  Laterality: N/A;  . GIVENS CAPSULE STUDY N/A 07/25/2017   Procedure: GIVENS CAPSULE STUDY;  Surgeon: [name], MD;  Location: [facility] ENDOSCOPY;  Service: Gastroenterology;  Laterality: N/A;  . LAPAROSCOPIC RIGHT COLECTOMY Right 08/08/2015   Procedure: LAPAROSCOPIC RIGHT COLECTOMY;  Surgeon: Robert Bellow, MD;  Location: [facility] ORS;  Service: General;  Laterality: Right;  . NASAL SINUS SURGERY    . PACEMAKER INSERTION Left 08/26/2016   Procedure: INSERTION PACEMAKER;  Surgeon: Isaias Cowman, MD;  Location: [facility] ORS;  Service: Cardiovascular;  Laterality: Left;  .  PROSTATE BIOPSY  01/01/13, 01/30/14   Gleason 3+3=6, vol 46.6 cc  . TONSILLECTOMY    . uvula surgery     for sleep apnea    There were no vitals filed for this visit.  Subjective Assessment - 06/05/18 1128    Subjective  Patient reports he has been performing his exercises and his shoulder is feeling much better.     Pertinent History  History multiple myleoma and prostate CA    Limitations  Lifting    Diagnostic tests  None    Patient Stated Goals  To decrease pain with movement    Currently in Pain?  No/denies    Pain Onset  More than a month ago       TREATMENT: Therapeutic Exercise: Scapular retraction/rows -- x 15 along OMEGA machine 10# Straight arm push downs at [employer] - x 20 15# Seated high row at [place] - x 20 20# Seated ball roll out prayer stretches - x 20  Push up PLUS at wall - x 20  Shoulder flexion with 1# weight on the left side - x 20  Standing serratus punches with  GTB - x20 Body blade - 3 x 15 sec on the R side arm at side for IR/ER (small) Shoulder ER - x20 with YTB   Patient demonstrates decreased pain at the end of the session   PT Education - 06/05/18 1129    Education Details  form/technique with exercise    Person(s) Educated  Patient    Methods  Explanation;Demonstration    Comprehension  Returned demonstration;Verbalized understanding          PT Long Term Goals - 05/24/18 1742      PT LONG TERM GOAL #1   Title  Patient will be independent with HEP to continue benefits of therapy after discharge.     Baseline  Dependent with HEP    Time  6    Period  Weeks    Status  New    Target Date  07/05/18      PT LONG TERM GOAL #2   Title  Patient will be able to perform full shoulder flexion AROM (equal to the L UE) without pain to better be able to perform functional lifting activities such as reaching a top shelf.     Baseline  90 degress flexion increased pain    Time  6    Period  Weeks    Status  New    Target Date  07/05/18      PT LONG TERM GOAL #3   Title  Patient will have a worst pain of 3/10 of shoulder pain to increase comfort and allow for greater amount of sleep.     Baseline  8/10 shoulder pain    Time  6    Period  Weeks    Status  New    Target Date  07/05/18      PT LONG TERM GOAL #4   Title  Patient will be able to sleep on his R shoulder to allow for greater comfort with sleep and improvement with tissue healing.    Baseline  Increased pain when sleeping on R UE    Time  6    Period  Weeks    Status  New    Target Date  07/05/18            Plan - 06/05/18 1131    Clinical Impression Statement  Focused on incorporating scapular and shoulder  movements to further progess patient's strength and coordination with exercises. Patient demosntrates decreased pain at the end of the session indicating improvement in coordination. Patient will benefit from further skilled therapy to return to prio rlevel of  function.     Rehab Potential  Good    Clinical Impairments Affecting Rehab Potential  (+) highly motivated (-) history of CA    PT Frequency  2x / week    PT Duration  6 weeks    PT Treatment/Interventions  Cryotherapy;Moist Heat;Ultrasound;Electrical Stimulation;Therapeutic exercise;Therapeutic activities;Iontophoresis 64m/ml Dexamethasone;Passive range of motion;Manual techniques;Patient/family education;Neuromuscular re-education;Dry needling;Spinal Manipulations;Joint Manipulations    PT Next Visit Plan  continue mobility and strength    PT Home Exercise Plan  See education section    Consulted and Agree with Plan of Care  Patient       Patient will benefit from skilled therapeutic intervention in order to improve the following deficits and impairments:  Decreased balance, Increased muscle spasms, Decreased endurance, Decreased range of motion, Improper body mechanics, Pain, Postural dysfunction, Decreased strength, Decreased safety awareness, Decreased coordination, Decreased activity tolerance  Visit Diagnosis: Chronic right shoulder pain  Stiffness of right shoulder, not elsewhere classified  Other muscle spasm     Problem List Patient Active Problem List   Diagnosis Date Noted  . Goals of care, counseling/discussion 05/02/2017  . Multiple myeloma ([place] 04/19/2017  . Iron deficiency anemia 03/14/2017  . PAD (peripheral artery disease) ([place] 11/23/2016  . Tobacco use disorder 11/23/2016  . AAA (abdominal aortic aneurysm) without rupture ([place] 11/23/2016  . Acute diastolic CHF (congestive heart failure) ([place] 08/27/2016  . AVB (atrioventricular block) 08/27/2016  . Cardiac pacemaker 08/27/2016  . Leukocytosis 08/27/2016  . Acute posthemorrhagic anemia 08/27/2016  . Iron deficiency anemia due to chronic blood loss 08/27/2016  . Acute on chronic renal insufficiency 08/27/2016  . Joint pain 08/27/2016  . Gout attack 08/27/2016  . Upper GI bleed 08/21/2016  . Allergic  state 12/16/2015  . Anxiety 12/16/2015  . Atrial fibrillation ([place] 12/16/2015  . Diabetes mellitus ([place] 12/16/2015  . HLD (hyperlipidemia) 12/16/2015  . BP (high blood pressure) 12/16/2015  . Fatty liver disease, nonalcoholic 016/03/9603 . Apnea, sleep 12/16/2015  . Tubulovillous adenoma polyp of colon 08/19/2015  . Hernia of abdominal cavity 07/31/2015  . Malignant neoplasm of prostate ([place] 02/20/2014  . Rheumatoid arthritis ([place] 01/15/2014    WBlythe Stanford PT DPT 06/05/2018, 12:04 PM  [facility] [address] [place] [address]Phone: [phone]  Fax:  [phone] Name: JLucah PettaMRN: [id number] of Birth: 802-19-1942

## 2018-06-08 ENCOUNTER — Ambulatory Visit: Payer: Medicare Other | Attending: Rheumatology

## 2018-06-08 DIAGNOSIS — M25611 Stiffness of right shoulder, not elsewhere classified: Secondary | ICD-10-CM | POA: Insufficient documentation

## 2018-06-08 DIAGNOSIS — G8929 Other chronic pain: Secondary | ICD-10-CM | POA: Insufficient documentation

## 2018-06-08 DIAGNOSIS — M62838 Other muscle spasm: Secondary | ICD-10-CM | POA: Diagnosis present

## 2018-06-08 DIAGNOSIS — M79641 Pain in right hand: Secondary | ICD-10-CM | POA: Diagnosis present

## 2018-06-08 DIAGNOSIS — M25511 Pain in right shoulder: Secondary | ICD-10-CM | POA: Insufficient documentation

## 2018-06-08 NOTE — Therapy (Signed)
[facility] [address], [place], [address] Phone: [phone]   Fax:  [phone]  Physical Therapy Treatment  Patient Details  Name: Omar [name] MRN: [id number] Date of Birth: [birth year] Referring Provider (PT): Percell Boston MD   Encounter Date: 06/08/2018  PT End of Session - 06/08/18 1252    Visit Number  3    Number of Visits  13    Date for PT Re-Evaluation  07/05/18    Authorization Type  3 /10 Medicare     PT Start Time  1115    PT Stop Time  1200    PT Time Calculation (min)  45 min    Activity Tolerance  Patient tolerated treatment well    Behavior During Therapy  [facility] for tasks assessed/performed       Past Medical History:  Diagnosis Date  . Anxiety   . Arthritis    rheumatoid  . Atrial fibrillation (HCC)    hx of  . Cancer ([place])   . Chronic kidney disease   . Colon polyp 07-07-15   TUBULAR ADENOMA WITH AT LEAST HIGH-GRADE / Dr Rayann Heman  . Diabetes mellitus without complication ([place])   . Dysrhythmia    bradycardia...Marland KitchenMarland Kitchen2 to 1 heart block  . GERD (gastroesophageal reflux disease)   . Heart murmur    patient unaware of history of murmur  . Hypercholesterolemia   . Hypertension   . Presence of permanent cardiac pacemaker   . Prostate cancer ([place]) 01/01/13, 01/30/14   Gleason 3+4=7, volume 46.6 cc  . Rheumatoid arthritis ([place])   . S/P radiation therapy  04/03/2014 through 06/04/2014                                                      Prostate 7800 cGy in 40 sessions                          . Sleep apnea    uses cpap but it is not in the hospital with him    Past Surgical History:  Procedure Laterality Date  . ABDOMINAL AORTIC ANEURYSM REPAIR  11/2013  . COLON SURGERY  March 2017   Right hemicolectomy for tubulovillous adenoma with high-grade dysplasia.  . COLONOSCOPY WITH PROPOFOL N/A 07/07/2015   Procedure: COLONOSCOPY WITH PROPOFOL;  Surgeon: Josefine Class, MD;  Location:  [facility] ENDOSCOPY;  Service: Endoscopy;  Laterality: N/A;  . COLONOSCOPY WITH PROPOFOL N/A 10/28/2016   Procedure: COLONOSCOPY WITH PROPOFOL;  Surgeon: [name], MD;  Location: [facility] ENDOSCOPY;  Service: Endoscopy;  Laterality: N/A;  . ESOPHAGOGASTRODUODENOSCOPY (EGD) WITH PROPOFOL N/A 10/28/2016   Procedure: ESOPHAGOGASTRODUODENOSCOPY (EGD) WITH PROPOFOL;  Surgeon: [name], MD;  Location: [facility] ENDOSCOPY;  Service: Endoscopy;  Laterality: N/A;  . GIVENS CAPSULE STUDY N/A 07/25/2017   Procedure: GIVENS CAPSULE STUDY;  Surgeon: [name], MD;  Location: [facility] ENDOSCOPY;  Service: Gastroenterology;  Laterality: N/A;  . LAPAROSCOPIC RIGHT COLECTOMY Right 08/08/2015   Procedure: LAPAROSCOPIC RIGHT COLECTOMY;  Surgeon: Robert Bellow, MD;  Location: [facility] ORS;  Service: General;  Laterality: Right;  . NASAL SINUS SURGERY    . PACEMAKER INSERTION Left 08/26/2016   Procedure: INSERTION PACEMAKER;  Surgeon: Isaias Cowman, MD;  Location: [facility] ORS;  Service: Cardiovascular;  Laterality: Left;  .  PROSTATE BIOPSY  01/01/13, 01/30/14   Gleason 3+3=6, vol 46.6 cc  . TONSILLECTOMY    . uvula surgery     for sleep apnea    There were no vitals filed for this visit.  Subjective Assessment - 06/08/18 1251    Subjective  Patient reports he continues to perform his HEP and has been having decreased pain overall.     Pertinent History  History multiple myleoma and prostate CA    Limitations  Lifting    Diagnostic tests  None    Patient Stated Goals  To decrease pain with movement    Currently in Pain?  No/denies    Pain Onset  More than a month ago       TREATMENT: Therapeutic Exercise: Shoulder ER - x20 with RTB Standing IR with shoulder abducted to 90 - x 20 Weight passes behind back - x 20  Standing IR with RTB in standing - x 20  Behind the back-shoulder IR with towel - x 20  Scapular retraction/rows -- x 20 along OMEGA machine 20# Straight arm push downs at [employer] - x 20 20# Shoulder ER  with RTB - x20 Shoulder flexion with 2# weight on the left side - x 20  Shoulder abduction with 1# weight on the left side - 2 x 10 Shoulder horizontal with shoulder at 90 deg - x 20 with RTB   Patient demonstrates decreased pain at the end of the session   PT Education - 06/08/18 1251    Education Details  form/technique with exercise    Person(s) Educated  Patient    Methods  Explanation;Demonstration    Comprehension  Verbalized understanding;Returned demonstration          PT Long Term Goals - 05/24/18 1742      PT LONG TERM GOAL #1   Title  Patient will be independent with HEP to continue benefits of therapy after discharge.     Baseline  Dependent with HEP    Time  6    Period  Weeks    Status  New    Target Date  07/05/18      PT LONG TERM GOAL #2   Title  Patient will be able to perform full shoulder flexion AROM (equal to the L UE) without pain to better be able to perform functional lifting activities such as reaching a top shelf.     Baseline  90 degress flexion increased pain    Time  6    Period  Weeks    Status  New    Target Date  07/05/18      PT LONG TERM GOAL #3   Title  Patient will have a worst pain of 3/10 of shoulder pain to increase comfort and allow for greater amount of sleep.     Baseline  8/10 shoulder pain    Time  6    Period  Weeks    Status  New    Target Date  07/05/18      PT LONG TERM GOAL #4   Title  Patient will be able to sleep on his R shoulder to allow for greater comfort with sleep and improvement with tissue healing.    Baseline  Increased pain when sleeping on R UE    Time  6    Period  Weeks    Status  New    Target Date  07/05/18  Plan - 06/08/18 1252    Clinical Impression Statement  Continued to focus on improving patient's IR secondary to have difficulty with performing reaching behind his back. Patient able to perform greater amount of shoulder flexion resistance compared to previous session.  Patient will benefit from further skilled therapy to return to prior level of function.     Rehab Potential  Good    Clinical Impairments Affecting Rehab Potential  (+) highly motivated (-) history of CA    PT Frequency  2x / week    PT Duration  6 weeks    PT Treatment/Interventions  Cryotherapy;Moist Heat;Ultrasound;Electrical Stimulation;Therapeutic exercise;Therapeutic activities;Iontophoresis 58m/ml Dexamethasone;Passive range of motion;Manual techniques;Patient/family education;Neuromuscular re-education;Dry needling;Spinal Manipulations;Joint Manipulations    PT Next Visit Plan  continue mobility and strength    PT Home Exercise Plan  See education section    Consulted and Agree with Plan of Care  Patient       Patient will benefit from skilled therapeutic intervention in order to improve the following deficits and impairments:  Decreased balance, Increased muscle spasms, Decreased endurance, Decreased range of motion, Improper body mechanics, Pain, Postural dysfunction, Decreased strength, Decreased safety awareness, Decreased coordination, Decreased activity tolerance  Visit Diagnosis: Chronic right shoulder pain  Stiffness of right shoulder, not elsewhere classified  Other muscle spasm     Problem List Patient Active Problem List   Diagnosis Date Noted  . Goals of care, counseling/discussion 05/02/2017  . Multiple myeloma ([place] 04/19/2017  . Iron deficiency anemia 03/14/2017  . PAD (peripheral artery disease) ([place] 11/23/2016  . Tobacco use disorder 11/23/2016  . AAA (abdominal aortic aneurysm) without rupture ([place] 11/23/2016  . Acute diastolic CHF (congestive heart failure) ([place] 08/27/2016  . AVB (atrioventricular block) 08/27/2016  . Cardiac pacemaker 08/27/2016  . Leukocytosis 08/27/2016  . Acute posthemorrhagic anemia 08/27/2016  . Iron deficiency anemia due to chronic blood loss 08/27/2016  . Acute on chronic renal insufficiency 08/27/2016  . Joint pain  08/27/2016  . Gout attack 08/27/2016  . Upper GI bleed 08/21/2016  . Allergic state 12/16/2015  . Anxiety 12/16/2015  . Atrial fibrillation ([place] 12/16/2015  . Diabetes mellitus ([place] 12/16/2015  . HLD (hyperlipidemia) 12/16/2015  . BP (high blood pressure) 12/16/2015  . Fatty liver disease, nonalcoholic 009/38/1829 . Apnea, sleep 12/16/2015  . Tubulovillous adenoma polyp of colon 08/19/2015  . Hernia of abdominal cavity 07/31/2015  . Malignant neoplasm of prostate ([place] 02/20/2014  . Rheumatoid arthritis ([place] 01/15/2014    WBlythe Stanford PT DPT 06/08/2018, 12:55 PM  [facility] [address] [place] [address]Phone: [phone]  Fax:  [phone] Name: JNirvaan FrettMRN: [id number] of Birth: 8December 20, 1942

## 2018-06-12 ENCOUNTER — Ambulatory Visit: Payer: Medicare Other

## 2018-06-15 ENCOUNTER — Ambulatory Visit: Payer: Medicare Other

## 2018-06-15 DIAGNOSIS — M62838 Other muscle spasm: Secondary | ICD-10-CM

## 2018-06-15 DIAGNOSIS — M25611 Stiffness of right shoulder, not elsewhere classified: Secondary | ICD-10-CM

## 2018-06-15 DIAGNOSIS — M25511 Pain in right shoulder: Principal | ICD-10-CM

## 2018-06-15 DIAGNOSIS — G8929 Other chronic pain: Secondary | ICD-10-CM

## 2018-06-15 NOTE — Therapy (Signed)
[facility] [address] [place], [address] Phone: [phone]   Fax:  [phone]  Physical Therapy Treatment  Patient Details  Name: Omar [name] MRN: [id number] Date of Birth: [birth year] Referring Provider (PT): Percell Boston MD   Encounter Date: 06/15/2018  PT End of Session - 06/15/18 1600    Visit Number  4    Number of Visits  13    Date for PT Re-Evaluation  07/05/18    Authorization Type  4 /10 Medicare     PT Start Time  3244    PT Stop Time  1630    PT Time Calculation (min)  45 min    Activity Tolerance  Patient tolerated treatment well    Behavior During Therapy  [facility] for tasks assessed/performed       Past Medical History:  Diagnosis Date  . Anxiety   . Arthritis    rheumatoid  . Atrial fibrillation (HCC)    hx of  . Cancer ([place])   . Chronic kidney disease   . Colon polyp 07-07-15   TUBULAR ADENOMA WITH AT LEAST HIGH-GRADE / Dr Rayann Heman  . Diabetes mellitus without complication ([place])   . Dysrhythmia    bradycardia...Marland KitchenMarland Kitchen2 to 1 heart block  . GERD (gastroesophageal reflux disease)   . Heart murmur    patient unaware of history of murmur  . Hypercholesterolemia   . Hypertension   . Presence of permanent cardiac pacemaker   . Prostate cancer ([place]) 01/01/13, 01/30/14   Gleason 3+4=7, volume 46.6 cc  . Rheumatoid arthritis ([place])   . S/P radiation therapy  04/03/2014 through 06/04/2014                                                      Prostate 7800 cGy in 40 sessions                          . Sleep apnea    uses cpap but it is not in the hospital with him    Past Surgical History:  Procedure Laterality Date  . ABDOMINAL AORTIC ANEURYSM REPAIR  11/2013  . COLON SURGERY  March 2017   Right hemicolectomy for tubulovillous adenoma with high-grade dysplasia.  . COLONOSCOPY WITH PROPOFOL N/A 07/07/2015   Procedure: COLONOSCOPY WITH PROPOFOL;  Surgeon: Josefine Class, MD;  Location:  [facility] ENDOSCOPY;  Service: Endoscopy;  Laterality: N/A;  . COLONOSCOPY WITH PROPOFOL N/A 10/28/2016   Procedure: COLONOSCOPY WITH PROPOFOL;  Surgeon: [name], MD;  Location: [facility] ENDOSCOPY;  Service: Endoscopy;  Laterality: N/A;  . ESOPHAGOGASTRODUODENOSCOPY (EGD) WITH PROPOFOL N/A 10/28/2016   Procedure: ESOPHAGOGASTRODUODENOSCOPY (EGD) WITH PROPOFOL;  Surgeon: [name], MD;  Location: [facility] ENDOSCOPY;  Service: Endoscopy;  Laterality: N/A;  . GIVENS CAPSULE STUDY N/A 07/25/2017   Procedure: GIVENS CAPSULE STUDY;  Surgeon: [name], MD;  Location: [facility] ENDOSCOPY;  Service: Gastroenterology;  Laterality: N/A;  . LAPAROSCOPIC RIGHT COLECTOMY Right 08/08/2015   Procedure: LAPAROSCOPIC RIGHT COLECTOMY;  Surgeon: Robert Bellow, MD;  Location: [facility] ORS;  Service: General;  Laterality: Right;  . NASAL SINUS SURGERY    . PACEMAKER INSERTION Left 08/26/2016   Procedure: INSERTION PACEMAKER;  Surgeon: Isaias Cowman, MD;  Location: [facility] ORS;  Service: Cardiovascular;  Laterality: Left;  .  PROSTATE BIOPSY  01/01/13, 01/30/14   Gleason 3+3=6, vol 46.6 cc  . TONSILLECTOMY    . uvula surgery     for sleep apnea    There were no vitals filed for this visit.  Subjective Assessment - 06/15/18 1552    Subjective  Patient reports his shoulder has been slightly sore as well as his wrists and hands. Patient states improvement overall.    Pertinent History  History multiple myleoma and prostate CA    Limitations  Lifting    Diagnostic tests  None    Patient Stated Goals  To decrease pain with movement    Currently in Pain?  No/denies    Pain Onset  More than a month ago       TREATMENT: Therapeutic Exercise: Scapular retraction/rows -- x 20 along OMEGA machine 20# Straight arm push downs at [place] - x 20 20# Shoulder horizontal with shoulder at 90 deg - x 20 with BTB Ball push outs in sitting - x 20  Shoulder ER in standing with shoulder flexion with YTB - x 20 B Shoulder ER - 2x20 with  YTB IR towel stretch- 1x20   Pt has limitations in shoulder elevation, ER and IR due to muscle weakness and pain requiring further skilled treatment.    PT Education - 06/15/18 1600    Education Details  form/technique with exercise    Person(s) Educated  Patient    Methods  Explanation;Demonstration    Comprehension  Verbalized understanding;Returned demonstration          PT Long Term Goals - 05/24/18 1742      PT LONG TERM GOAL #1   Title  Patient will be independent with HEP to continue benefits of therapy after discharge.     Baseline  Dependent with HEP    Time  6    Period  Weeks    Status  New    Target Date  07/05/18      PT LONG TERM GOAL #2   Title  Patient will be able to perform full shoulder flexion AROM (equal to the L UE) without pain to better be able to perform functional lifting activities such as reaching a top shelf.     Baseline  90 degress flexion increased pain    Time  6    Period  Weeks    Status  New    Target Date  07/05/18      PT LONG TERM GOAL #3   Title  Patient will have a worst pain of 3/10 of shoulder pain to increase comfort and allow for greater amount of sleep.     Baseline  8/10 shoulder pain    Time  6    Period  Weeks    Status  New    Target Date  07/05/18      PT LONG TERM GOAL #4   Title  Patient will be able to sleep on his R shoulder to allow for greater comfort with sleep and improvement with tissue healing.    Baseline  Increased pain when sleeping on R UE    Time  6    Period  Weeks    Status  New    Target Date  07/05/18            Plan - 06/15/18 1603    Clinical Impression Statement  Patient continues to demonstrate decreased shoulder IR and ER within the R shoulder howewer demosntrates improvement compared to previous sessions.  Patient continues to demonstrate decreased IR mostly with apley scratch tests. Increased pain along top of R shoulder with increased pain with overhead motions. Patient will benefit  from further skilled therapy to return to prior level of function.     Rehab Potential  Good    Clinical Impairments Affecting Rehab Potential  (+) highly motivated (-) history of CA    PT Frequency  2x / week    PT Duration  6 weeks    PT Treatment/Interventions  Cryotherapy;Moist Heat;Ultrasound;Electrical Stimulation;Therapeutic exercise;Therapeutic activities;Iontophoresis 25m/ml Dexamethasone;Passive range of motion;Manual techniques;Patient/family education;Neuromuscular re-education;Dry needling;Spinal Manipulations;Joint Manipulations    PT Next Visit Plan  continue mobility and strength    PT Home Exercise Plan  See education section    Consulted and Agree with Plan of Care  Patient       Patient will benefit from skilled therapeutic intervention in order to improve the following deficits and impairments:  Decreased balance, Increased muscle spasms, Decreased endurance, Decreased range of motion, Improper body mechanics, Pain, Postural dysfunction, Decreased strength, Decreased safety awareness, Decreased coordination, Decreased activity tolerance  Visit Diagnosis: Chronic right shoulder pain  Stiffness of right shoulder, not elsewhere classified  Other muscle spasm     Problem List Patient Active Problem List   Diagnosis Date Noted  . Goals of care, counseling/discussion 05/02/2017  . Multiple myeloma ([place] 04/19/2017  . Iron deficiency anemia 03/14/2017  . PAD (peripheral artery disease) ([place] 11/23/2016  . Tobacco use disorder 11/23/2016  . AAA (abdominal aortic aneurysm) without rupture ([place] 11/23/2016  . Acute diastolic CHF (congestive heart failure) ([place] 08/27/2016  . AVB (atrioventricular block) 08/27/2016  . Cardiac pacemaker 08/27/2016  . Leukocytosis 08/27/2016  . Acute posthemorrhagic anemia 08/27/2016  . Iron deficiency anemia due to chronic blood loss 08/27/2016  . Acute on chronic renal insufficiency 08/27/2016  . Joint pain 08/27/2016  . Gout attack  08/27/2016  . Upper GI bleed 08/21/2016  . Allergic state 12/16/2015  . Anxiety 12/16/2015  . Atrial fibrillation ([place] 12/16/2015  . Diabetes mellitus ([place] 12/16/2015  . HLD (hyperlipidemia) 12/16/2015  . BP (high blood pressure) 12/16/2015  . Fatty liver disease, nonalcoholic 040/98/1191 . Apnea, sleep 12/16/2015  . Tubulovillous adenoma polyp of colon 08/19/2015  . Hernia of abdominal cavity 07/31/2015  . Malignant neoplasm of prostate ([place] 02/20/2014  . Rheumatoid arthritis ([place] 01/15/2014    TJeryl Columbia1/02/2019, 5:02 PM  [facility] [address] [place] [address]Phone: [phone]  Fax:  [phone] Name: JJamesrobert OhanesianMRN: [id number] of Birth: [birth year]

## 2018-06-19 ENCOUNTER — Ambulatory Visit: Payer: Medicare Other

## 2018-06-20 ENCOUNTER — Ambulatory Visit: Payer: Medicare Other

## 2018-06-20 DIAGNOSIS — M25511 Pain in right shoulder: Principal | ICD-10-CM

## 2018-06-20 DIAGNOSIS — G8929 Other chronic pain: Secondary | ICD-10-CM

## 2018-06-20 DIAGNOSIS — M25611 Stiffness of right shoulder, not elsewhere classified: Secondary | ICD-10-CM

## 2018-06-20 DIAGNOSIS — M79641 Pain in right hand: Secondary | ICD-10-CM

## 2018-06-20 NOTE — Therapy (Signed)
[facility] [address] [place], [address] Phone: [phone]   Fax:  [phone]  Physical Therapy Treatment  Patient Details  Name: Omar [name] MRN: [id number] Date of Birth: 1940-12-13 Referring Provider (PT): Percell Boston MD   Encounter Date: 06/20/2018  PT End of Session - 06/20/18 1438    Visit Number  5    Number of Visits  13    Date for PT Re-Evaluation  07/05/18    Authorization Type  5 /10 Medicare     PT Start Time  7628    PT Stop Time  1430    PT Time Calculation (min)  45 min    Activity Tolerance  Patient tolerated treatment well    Behavior During Therapy  [facility] for tasks assessed/performed       Past Medical History:  Diagnosis Date  . Anxiety   . Arthritis    rheumatoid  . Atrial fibrillation (HCC)    hx of  . Cancer ([place])   . Chronic kidney disease   . Colon polyp 07-07-15   TUBULAR ADENOMA WITH AT LEAST HIGH-GRADE / Dr Rayann Heman  . Diabetes mellitus without complication ([place])   . Dysrhythmia    bradycardia...Marland KitchenMarland Kitchen2 to 1 heart block  . GERD (gastroesophageal reflux disease)   . Heart murmur    patient unaware of history of murmur  . Hypercholesterolemia   . Hypertension   . Presence of permanent cardiac pacemaker   . Prostate cancer ([place]) 01/01/13, 01/30/14   Gleason 3+4=7, volume 46.6 cc  . Rheumatoid arthritis ([place])   . S/P radiation therapy  04/03/2014 through 06/04/2014                                                      Prostate 7800 cGy in 40 sessions                          . Sleep apnea    uses cpap but it is not in the hospital with him    Past Surgical History:  Procedure Laterality Date  . ABDOMINAL AORTIC ANEURYSM REPAIR  11/2013  . COLON SURGERY  March 2017   Right hemicolectomy for tubulovillous adenoma with high-grade dysplasia.  . COLONOSCOPY WITH PROPOFOL N/A 07/07/2015   Procedure: COLONOSCOPY WITH PROPOFOL;  Surgeon: Josefine Class, MD;   [facility] ENDOSCOPY;  Service: Endoscopy;  Laterality: N/A;  . COLONOSCOPY WITH PROPOFOL N/A 10/28/2016   Procedure: COLONOSCOPY WITH PROPOFOL;  Surgeon: [name], MD;  Location: [facility] ENDOSCOPY;  Service: Endoscopy;  Laterality: N/A;  . ESOPHAGOGASTRODUODENOSCOPY (EGD) WITH PROPOFOL N/A 10/28/2016   Procedure: ESOPHAGOGASTRODUODENOSCOPY (EGD) WITH PROPOFOL;  Surgeon: [name], MD;  Location: [facility] ENDOSCOPY;  Service: Endoscopy;  Laterality: N/A;  . GIVENS CAPSULE STUDY N/A 07/25/2017   Procedure: GIVENS CAPSULE STUDY;  Surgeon: [name], MD;  Location: [facility] ENDOSCOPY;  Service: Gastroenterology;  Laterality: N/A;  . LAPAROSCOPIC RIGHT COLECTOMY Right 08/08/2015   Procedure: LAPAROSCOPIC RIGHT COLECTOMY;  Surgeon: Robert Bellow, MD;  Location: [facility] ORS;  Service: General;  Laterality: Right;  . NASAL SINUS SURGERY    . PACEMAKER INSERTION Left 08/26/2016   Procedure: INSERTION PACEMAKER;  Surgeon: Isaias Cowman, MD;  Location: [facility] ORS;  Service: Cardiovascular;  Laterality: Left;  .  PROSTATE BIOPSY  01/01/13, 01/30/14   Gleason 3+3=6, vol 46.6 cc  . TONSILLECTOMY    . uvula surgery     for sleep apnea    There were no vitals filed for this visit.  Subjective Assessment - 06/20/18 1434    Subjective  Pt states that shoulder pain has been minimal before PT session. Pt reported some pain in right shoulder last night when trying to go to sleep but was improved by morning. Pt informed SPT that his right hand and wrist has been irritated and in some pain.     Pertinent History  History multiple myleoma and prostate CA    Limitations  Lifting    Diagnostic tests  None    Patient Stated Goals  To decrease pain with movement    Pain Onset  More than a month ago        TREATMENT Therapeutic Exercise Seated physioball roll out for shoulder flexion: 2 min Standing B DB shoulder elevation: 2x12, 3 lbs IR towel stretch: 3 min Standing rows: 2x20, 20 lbs Standing THB (Yellow)  right shoulder ER: 2x20 Standing THB (Yellow) right shoulder IR: 2x20 Shoulder Elevation wall crawl with THB around elbows (Yellow): 2x20  Therapeutic exercise to improve right shoulder elevation, IR, and ER. Exercises to strengthen periscapular muscles and strengthen right arm in overhead motions to improve level of function for ADL's. Mobility stretches to improve shoulder AROM for improved ADL's.       PT Education - 06/20/18 1437    Education Details  How a stretch should feel versus pain. Form/technique with exercise.     Person(s) Educated  Patient    Methods  Explanation;Demonstration;Tactile cues;Verbal cues    Comprehension  Verbalized understanding;Returned demonstration          PT Long Term Goals - 05/24/18 1742      PT LONG TERM GOAL #1   Title  Patient will be independent with HEP to continue benefits of therapy after discharge.     Baseline  Dependent with HEP    Time  6    Period  Weeks    Status  New    Target Date  07/05/18      PT LONG TERM GOAL #2   Title  Patient will be able to perform full shoulder flexion AROM (equal to the L UE) without pain to better be able to perform functional lifting activities such as reaching a top shelf.     Baseline  90 degress flexion increased pain    Time  6    Period  Weeks    Status  New    Target Date  07/05/18      PT LONG TERM GOAL #3   Title  Patient will have a worst pain of 3/10 of shoulder pain to increase comfort and allow for greater amount of sleep.     Baseline  8/10 shoulder pain    Time  6    Period  Weeks    Status  New    Target Date  07/05/18      PT LONG TERM GOAL #4   Title  Patient will be able to sleep on his R shoulder to allow for greater comfort with sleep and improvement with tissue healing.    Baseline  Increased pain when sleeping on R UE    Time  6    Period  Weeks    Status  New    Target Date  07/05/18  Plan - 06/20/18 1439    Clinical Impression Statement  Pt  stated no pain during session today and after PT treatment session. Pt is improving in strengthening of right shoulder elevation, ER, and IR. Although pt is becoming stronger, pt still has limited right shoulder IR AROM and requires further skilled treatment. Pt presents with mild right hand pain in MCP and PIP joints most likely secondary to OA. It did not limit pt treatment session.    Rehab Potential  Good    Clinical Impairments Affecting Rehab Potential  (+) highly motivated (-) history of CA    PT Frequency  2x / week    PT Duration  6 weeks    PT Treatment/Interventions  Cryotherapy;Moist Heat;Ultrasound;Electrical Stimulation;Therapeutic exercise;Therapeutic activities;Iontophoresis 58m/ml Dexamethasone;Passive range of motion;Manual techniques;Patient/family education;Neuromuscular re-education;Dry needling;Spinal Manipulations;Joint Manipulations    PT Next Visit Plan  continue mobility and strength    PT Home Exercise Plan  See education section    Consulted and Agree with Plan of Care  Patient       Patient will benefit from skilled therapeutic intervention in order to improve the following deficits and impairments:  Decreased balance, Increased muscle spasms, Decreased endurance, Decreased range of motion, Improper body mechanics, Pain, Postural dysfunction, Decreased strength, Decreased safety awareness, Decreased coordination, Decreased activity tolerance  Visit Diagnosis: Chronic right shoulder pain  Stiffness of right shoulder, not elsewhere classified  Pain in right hand     Problem List Patient Active Problem List   Diagnosis Date Noted  . Goals of care, counseling/discussion 05/02/2017  . Multiple myeloma ([place] 04/19/2017  . Iron deficiency anemia 03/14/2017  . PAD (peripheral artery disease) ([place]itrus [place] 11/23/2016  . Tobacco use disorder 11/23/2016  . AAA (abdominal aortic aneurysm) without rupture ([place] 11/23/2016  . Acute diastolic CHF (congestive heart failure)  (HCastro 08/27/2016  . AVB (atrioventricular block) 08/27/2016  . Cardiac pacemaker 08/27/2016  . Leukocytosis 08/27/2016  . Acute posthemorrhagic anemia 08/27/2016  . Iron deficiency anemia due to chronic blood loss 08/27/2016  . Acute on chronic renal insufficiency 08/27/2016  . Joint pain 08/27/2016  . Gout attack 08/27/2016  . Upper GI bleed 08/21/2016  . Allergic state 12/16/2015  . Anxiety 12/16/2015  . Atrial fibrillation ([place] 12/16/2015  . Diabetes mellitus ([place] 12/16/2015  . HLD (hyperlipidemia) 12/16/2015  . BP (high blood pressure) 12/16/2015  . Fatty liver disease, nonalcoholic 011/15/5208 . Apnea, sleep 12/16/2015  . Tubulovillous adenoma polyp of colon 08/19/2015  . Hernia of abdominal cavity 07/31/2015  . Malignant neoplasm of prostate ([place] 02/20/2014  . Rheumatoid arthritis (HL[place] 01/15/2014    TRachael Fee SPT 06/20/2018, 2:49 PM  [facility] [address] [place] [address]Phone: [phone]  Fax:  [phone] Name: JKeyden PavlovMRN: [id number] of Birth: 804/06/1940

## 2018-06-21 ENCOUNTER — Ambulatory Visit: Payer: Medicare Other

## 2018-07-03 ENCOUNTER — Ambulatory Visit: Payer: Medicare Other

## 2018-07-03 DIAGNOSIS — M25511 Pain in right shoulder: Secondary | ICD-10-CM | POA: Diagnosis not present

## 2018-07-03 DIAGNOSIS — M25611 Stiffness of right shoulder, not elsewhere classified: Secondary | ICD-10-CM

## 2018-07-03 DIAGNOSIS — G8929 Other chronic pain: Secondary | ICD-10-CM

## 2018-07-03 NOTE — Therapy (Signed)
[place] [facility] [address], [place], [address] Phone: [phone]   Fax:  [phone]  Physical Therapy Treatment  Patient Details  Name: Omar [name] MRN: [id number] Date of Birth: [birth year] Referring Provider (PT): Percell Boston MD   Encounter Date: 07/03/2018  PT End of Session - 07/03/18 1435    Visit Number  6    Number of Visits  13    Date for PT Re-Evaluation  07/05/18    Authorization Type  6/10 Medicare     PT Start Time  1345    PT Stop Time  1430    PT Time Calculation (min)  45 min    Activity Tolerance  Patient tolerated treatment well    Behavior During Therapy  [facility] for tasks assessed/performed       Past Medical History:  Diagnosis Date  . Anxiety   . Arthritis    rheumatoid  . Atrial fibrillation (HCC)    hx of  . Cancer ([place])   . Chronic kidney disease   . Colon polyp 07-07-15   TUBULAR ADENOMA WITH AT LEAST HIGH-GRADE / Dr Rayann Heman  . Diabetes mellitus without complication ([place])   . Dysrhythmia    bradycardia...Marland KitchenMarland Kitchen2 to 1 heart block  . GERD (gastroesophageal reflux disease)   . Heart murmur    patient unaware of history of murmur  . Hypercholesterolemia   . Hypertension   . Presence of permanent cardiac pacemaker   . Prostate cancer ([place]) 01/01/13, 01/30/14   Gleason 3+4=7, volume 46.6 cc  . Rheumatoid arthritis ([place])   . S/P radiation therapy  04/03/2014 through 06/04/2014                                                      Prostate 7800 cGy in 40 sessions                          . Sleep apnea    uses cpap but it is not in the hospital with him    Past Surgical History:  Procedure Laterality Date  . ABDOMINAL AORTIC ANEURYSM REPAIR  11/2013  . COLON SURGERY  March 2017   Right hemicolectomy for tubulovillous adenoma with high-grade dysplasia.  . COLONOSCOPY WITH PROPOFOL N/A 07/07/2015   Procedure: COLONOSCOPY WITH PROPOFOL;  Surgeon: Josefine Class, MD;  Location:  [facility] ENDOSCOPY;  Service: Endoscopy;  Laterality: N/A;  . COLONOSCOPY WITH PROPOFOL N/A 10/28/2016   Procedure: COLONOSCOPY WITH PROPOFOL;  Surgeon: [name], MD;  Location: [facility] ENDOSCOPY;  Service: Endoscopy;  Laterality: N/A;  . ESOPHAGOGASTRODUODENOSCOPY (EGD) WITH PROPOFOL N/A 10/28/2016   Procedure: ESOPHAGOGASTRODUODENOSCOPY (EGD) WITH PROPOFOL;  Surgeon: [name], MD;  Location: [facility] ENDOSCOPY;  Service: Endoscopy;  Laterality: N/A;  . GIVENS CAPSULE STUDY N/A 07/25/2017   Procedure: GIVENS CAPSULE STUDY;  Surgeon: [name], MD;  Location: [facility] ENDOSCOPY;  Service: Gastroenterology;  Laterality: N/A;  . LAPAROSCOPIC RIGHT COLECTOMY Right 08/08/2015   Procedure: LAPAROSCOPIC RIGHT COLECTOMY;  Surgeon: Robert Bellow, MD;  Location: [facility] ORS;  Service: General;  Laterality: Right;  . NASAL SINUS SURGERY    . PACEMAKER INSERTION Left 08/26/2016   Procedure: INSERTION PACEMAKER;  Surgeon: Isaias Cowman, MD;  Location: [facility] ORS;  Service: Cardiovascular;  Laterality: Left;  .  PROSTATE BIOPSY  01/01/13, 01/30/14   Gleason 3+3=6, vol 46.6 cc  . TONSILLECTOMY    . uvula surgery     for sleep apnea    There were no vitals filed for this visit.  Subjective Assessment - 07/03/18 1359    Subjective  Pt reports not having shoulder pain. Pt reports coming in with gloves to help with his B hand pain. Pt states that PT has been "helpful".    Pertinent History  History multiple myleoma and prostate CA    Limitations  Lifting    Diagnostic tests  None    Patient Stated Goals  To decrease pain with movement    Currently in Pain?  No/denies    Pain Score  0-No pain    Pain Onset  More than a month ago     TREATMENT THERAPEUTIC EXERCISE  Standing physioball roll-out for shoulder flexion: 2 min   Standing physioball roll-out for shoulder abduction: 2 min   IR towel stretch: 1x20  Standing TB (Red) right shoulder ER: 2x12  Standing TB (Red) right shoulder IR: 2x12  Standing rows:  2x10, 20 lbs.  Lat pull-down: 2x10, 35 lbs.   B Standing DB (3 lbs) shoulder elevation: 1x10  Standing DB (3 lbs) shoulder scaption: 1x10  Therapeutic exercise to improve right shoulder AROM of ER, IR, and flexion. Exercises to strengthen right shoulder musculature and allow for greater performance of overhead movement.  PT Education - 07/03/18 1419    Education Details  Form/technique with exercise.    Person(s) Educated  Patient    Methods  Explanation;Demonstration;Tactile cues;Verbal cues    Comprehension  Verbalized understanding;Returned demonstration          PT Long Term Goals - 05/24/18 1742      PT LONG TERM GOAL #1   Title  Patient will be independent with HEP to continue benefits of therapy after discharge.     Baseline  Dependent with HEP    Time  6    Period  Weeks    Status  New    Target Date  07/05/18      PT LONG TERM GOAL #2   Title  Patient will be able to perform full shoulder flexion AROM (equal to the L UE) without pain to better be able to perform functional lifting activities such as reaching a top shelf.     Baseline  90 degress flexion increased pain    Time  6    Period  Weeks    Status  New    Target Date  07/05/18      PT LONG TERM GOAL #3   Title  Patient will have a worst pain of 3/10 of shoulder pain to increase comfort and allow for greater amount of sleep.     Baseline  8/10 shoulder pain    Time  6    Period  Weeks    Status  New    Target Date  07/05/18      PT LONG TERM GOAL #4   Title  Patient will be able to sleep on his R shoulder to allow for greater comfort with sleep and improvement with tissue healing.    Baseline  Increased pain when sleeping on R UE    Time  6    Period  Weeks    Status  New    Target Date  07/05/18            Plan - 07/03/18 1436  Clinical Impression Statement  Pt reports no right shoulder pain prior and after PT session. Pt is able to perform strenghtening exercises with increased weight  and requiring less rest breaks proving carryover from previous PT treatments. Pt continues to have decreased right shoulder strength of ER, IR, and elevation, with decreased right shoulder AROM IR and ER. Pt can continue to benefit from skilled treatment to return to PLOF.    Rehab Potential  Good    Clinical Impairments Affecting Rehab Potential  (+) highly motivated (-) history of CA    PT Frequency  2x / week    PT Duration  6 weeks    PT Treatment/Interventions  Cryotherapy;Moist Heat;Ultrasound;Electrical Stimulation;Therapeutic exercise;Therapeutic activities;Iontophoresis 73m/ml Dexamethasone;Passive range of motion;Manual techniques;Patient/family education;Neuromuscular re-education;Dry needling;Spinal Manipulations;Joint Manipulations    PT Next Visit Plan  continue mobility and strength    PT Home Exercise Plan  See education section    Consulted and Agree with Plan of Care  Patient       Patient will benefit from skilled therapeutic intervention in order to improve the following deficits and impairments:  Decreased balance, Increased muscle spasms, Decreased endurance, Decreased range of motion, Improper body mechanics, Pain, Postural dysfunction, Decreased strength, Decreased safety awareness, Decreased coordination, Decreased activity tolerance  Visit Diagnosis: Chronic right shoulder pain  Stiffness of right shoulder, not elsewhere classified     Problem List Patient Active Problem List   Diagnosis Date Noted  . Goals of care, counseling/discussion 05/02/2017  . Multiple myeloma ([place] 04/19/2017  . Iron deficiency anemia 03/14/2017  . PAD (peripheral artery disease) ([place] 11/23/2016  . Tobacco use disorder 11/23/2016  . AAA (abdominal aortic aneurysm) without rupture ([place] 11/23/2016  . Acute diastolic CHF (congestive heart failure) ([place] 08/27/2016  . AVB (atrioventricular block) 08/27/2016  . Cardiac pacemaker 08/27/2016  . Leukocytosis 08/27/2016  . Acute  posthemorrhagic anemia 08/27/2016  . Iron deficiency anemia due to chronic blood loss 08/27/2016  . Acute on chronic renal insufficiency 08/27/2016  . Joint pain 08/27/2016  . Gout attack 08/27/2016  . Upper GI bleed 08/21/2016  . Allergic state 12/16/2015  . Anxiety 12/16/2015  . Atrial fibrillation ([place] 12/16/2015  . Diabetes mellitus ([place] 12/16/2015  . HLD (hyperlipidemia) 12/16/2015  . BP (high blood pressure) 12/16/2015  . Fatty liver disease, nonalcoholic 027/12/8673 . Apnea, sleep 12/16/2015  . Tubulovillous adenoma polyp of colon 08/19/2015  . Hernia of abdominal cavity 07/31/2015  . Malignant neoplasm of prostate ([place] 02/20/2014  . Rheumatoid arthritis ([place] 01/15/2014    TRachael Fee SPT 07/03/2018, 2:41 PM  [facility] [address] [place] [address]Phone: [phone]  Fax:  [phone] Name: JSameer TeepleMRN: [id number] of Birth: [birth year]

## 2018-07-10 ENCOUNTER — Ambulatory Visit: Payer: Medicare Other

## 2018-07-13 ENCOUNTER — Ambulatory Visit: Payer: Medicare Other | Attending: Rheumatology

## 2018-07-13 VITALS — BP 117/70 | HR 75

## 2018-07-13 DIAGNOSIS — G8929 Other chronic pain: Secondary | ICD-10-CM

## 2018-07-13 DIAGNOSIS — M25511 Pain in right shoulder: Secondary | ICD-10-CM | POA: Insufficient documentation

## 2018-07-13 DIAGNOSIS — M25611 Stiffness of right shoulder, not elsewhere classified: Secondary | ICD-10-CM | POA: Insufficient documentation

## 2018-07-13 NOTE — Therapy (Signed)
[place] [facility] [address], [place], [address] Phone: [phone]   Fax:  [phone]  Patient Details  Name: Omar [name] MRN: [id number] Date of Birth: [birth year] Referring Provider:  Emmaline Kluver.,*  Encounter Date: 07/13/2018    Vitals: BP: 117/70, HR: 75, SPO2: 96%  Pt came into session today explaining he was experiencing chest tightness earlier this morning. At treatment session pt reports the chest tightness has dissipated but that he felt "dizzy". Pt reports having an episode of Afib last week and currently describes feeling "different". Pt reports not having any of these symptoms "for a while". Pt exclaims having no difficulty with swallowing, chewing, breathing, or any other possible cranial nerves. No visible face drooping or signs and symptoms of stroke at this time. Pt reports taking blood pressure medication this morning. Pt reports having increased levels of stress due to family situations and thinks it could be a possible cause of his symptoms. SPT and PT took pt's vitals and all were WNL.Manual HR was taken by PT and rhythm and pulse were normal. Pt reports not feeling up to completing PT session and due to pt symptoms it was decided to take the day off from treatment. Before leaving, pt was educated on reporting to ED to receive further treatment if pt had a return of symptoms and/or worsening of symptoms.   Rachael Fee, SPT 07/13/2018, 11:33 AM  [place] [facility] [address], [place], [address] Phone: [phone]   Fax:  [phone]

## 2018-07-17 ENCOUNTER — Ambulatory Visit: Payer: Medicare Other

## 2018-07-17 DIAGNOSIS — G8929 Other chronic pain: Secondary | ICD-10-CM

## 2018-07-17 DIAGNOSIS — M25611 Stiffness of right shoulder, not elsewhere classified: Secondary | ICD-10-CM | POA: Diagnosis present

## 2018-07-17 DIAGNOSIS — M25511 Pain in right shoulder: Principal | ICD-10-CM

## 2018-07-17 NOTE — Therapy (Signed)
[place] [facility] [address], [place], [address] Phone: [phone]   Fax:  [phone]  Physical Therapy Discharge/ Progress Note  Patient Details  Name: Omar [name] MRN: [id number] Date of Birth: [birth year] Referring Provider (PT): Percell Boston MD   Encounter Date: 07/17/2018  PT End of Session - 07/17/18 1311    Visit Number  7    Number of Visits  13    Date for PT Re-Evaluation  07/05/18    Authorization Type  7/10 Medicare     PT Start Time  1300    PT Stop Time  1345    PT Time Calculation (min)  45 min    Activity Tolerance  Patient tolerated treatment well    Behavior During Therapy  [facility] for tasks assessed/performed       Past Medical History:  Diagnosis Date  . Anxiety   . Arthritis    rheumatoid  . Atrial fibrillation (HCC)    hx of  . Cancer ([place])   . Chronic kidney disease   . Colon polyp 07-07-15   TUBULAR ADENOMA WITH AT LEAST HIGH-GRADE / Dr Rayann Heman  . Diabetes mellitus without complication ([place])   . Dysrhythmia    bradycardia...Marland KitchenMarland Kitchen2 to 1 heart block  . GERD (gastroesophageal reflux disease)   . Heart murmur    patient unaware of history of murmur  . Hypercholesterolemia   . Hypertension   . Presence of permanent cardiac pacemaker   . Prostate cancer ([place]) 01/01/13, 01/30/14   Gleason 3+4=7, volume 46.6 cc  . Rheumatoid arthritis ([place])   . S/P radiation therapy  04/03/2014 through 06/04/2014                                                      Prostate 7800 cGy in 40 sessions                          . Sleep apnea    uses cpap but it is not in the hospital with him    Past Surgical History:  Procedure Laterality Date  . ABDOMINAL AORTIC ANEURYSM REPAIR  11/2013  . COLON SURGERY  March 2017   Right hemicolectomy for tubulovillous adenoma with high-grade dysplasia.  . COLONOSCOPY WITH PROPOFOL N/A 07/07/2015   Procedure: COLONOSCOPY WITH PROPOFOL;  Surgeon: Josefine Class,  MD;  Location: [facility] ENDOSCOPY;  Service: Endoscopy;  Laterality: N/A;  . COLONOSCOPY WITH PROPOFOL N/A 10/28/2016   Procedure: COLONOSCOPY WITH PROPOFOL;  Surgeon: [name], MD;  Location[facility];  Service: Endoscopy;  Laterality: N/A;  . ESOPHAGOGASTRODUODENOSCOPY (EGD) WITH PROPOFOL N/A 10/28/2016   Procedure: ESOPHAGOGASTRODUODENOSCOPY (EGD) WITH PROPOFOL;  Surgeon: [name], MD;  Location: [facility] ENDOSCOPY;  Service: Endoscopy;  Laterality: N/A;  . GIVENS CAPSULE STUDY N/A 07/25/2017   Procedure: GIVENS CAPSULE STUDY;  Surgeon: [name], MD;  Location: [facility] ENDOSCOPY;  Service: Gastroenterology;  Laterality: N/A;  . LAPAROSCOPIC RIGHT COLECTOMY Right 08/08/2015   Procedure: LAPAROSCOPIC RIGHT COLECTOMY;  Surgeon: Robert Bellow, MD;  Location: [facility] ORS;  Service: General;  Laterality: Right;  . NASAL SINUS SURGERY    . PACEMAKER INSERTION Left 08/26/2016   Procedure: INSERTION PACEMAKER;  Surgeon: Isaias Cowman, MD;  Location: [facility] ORS;  Service: Cardiovascular;  Laterality: Left;  .  PROSTATE BIOPSY  01/01/13, 01/30/14   Gleason 3+3=6, vol 46.6 cc  . TONSILLECTOMY    . uvula surgery     for sleep apnea    There were no vitals filed for this visit.  Subjective Assessment - 07/17/18 1306    Subjective  Pt reports feeling "much better" after previous session when pt was experiencing chest tightness. No reports of shoulder pain at this time. Pt states that his shoulder is "as good as it's going to get" with PT and that he believes he is ready to be discharged.    Pertinent History  History multiple myleoma and prostate CA    Limitations  Lifting    Diagnostic tests  None    Patient Stated Goals  To decrease pain with movement    Currently in Pain?  No/denies    Pain Onset  More than a month ago      TREATMENT/ PROGRESS NOTE THERAPEUTIC EXERCISE  Seated physioball shoulder flexion: 2 min  IR towel stretch: 1x20 reps  Standing TB (Red) shoulder extension:  2x10  Standing Rows with TB (Red): 2x10  Standing TB (Red) shoulder ER: 2x10  Standing TB (Red) shoulder IR: 2x10  Focus of PT session was performing HEP that patient can perform at home due to pt meeting all long-term goals for therapy. Pt educated on all exercises and how to perform them at home independently. Therapeutic exercises for periscapular shoulder strength and towel stretch to improve shoulder AROM in IR.   PT Education - 07/17/18 1310    Education Details  Form/technique with execises. Educated pt on exercises to perform at home for HEP since pt is close to D/C,    Person(s) Educated  Patient    Methods  Explanation;Demonstration;Tactile cues;Verbal cues    Comprehension  Verbalized understanding;Returned demonstration          PT Long Term Goals - 07/17/18 1356      PT LONG TERM GOAL #1   Title  Patient will be independent with HEP to continue benefits of therapy after discharge.     Baseline  Dependent with HEP; 07/17/2018 indepdendent with HEP    Time  6    Period  Weeks    Status  Achieved      PT LONG TERM GOAL #2   Title  Patient will be able to perform full shoulder flexion AROM (equal to the L UE) without pain to better be able to perform functional lifting activities such as reaching a top shelf.     Baseline  90 degress flexion increased pain; full flexion no pain equal to uninvoled UE 07/17/2018    Time  6    Period  Weeks    Status  Achieved      PT LONG TERM GOAL #3   Title  Patient will have a worst pain of 3/10 of shoulder pain to increase comfort and allow for greater amount of sleep.     Baseline  8/10 shoulder pain; 3/10 07/17/2018    Time  6    Period  Weeks    Status  Achieved      PT LONG TERM GOAL #4   Title  Patient will be able to sleep on his R shoulder to allow for greater comfort with sleep and improvement with tissue healing.    Baseline  Increased pain when sleeping on R UE; no pain sleeping on right shoulder 07/17/2018    Time  6     Period  Weeks    Status  Achieved            Plan - 07/17/18 1352    Clinical Impression Statement  Pt has achieved all long term goals made for therapy as follows: Consistent with HEP, full shoulder flexion that is equal to opposite side, pt's worst pain is a 3/10 NPRS, and pt states he is able to sleep on his right shoulder with no discomfort or pain. Pt was able to perform and return demonstration of all exercises given for HEP with independence. Minor cueing for form but pt demonstrated carryover after cuing. No questions asked from pt about HEP.Patient is to be discharged from PT.    Rehab Potential  Good    Clinical Impairments Affecting Rehab Potential  (+) highly motivated (-) history of CA    PT Frequency  2x / week    PT Duration  6 weeks    PT Treatment/Interventions  Cryotherapy;Moist Heat;Ultrasound;Electrical Stimulation;Therapeutic exercise;Therapeutic activities;Iontophoresis 71m/ml Dexamethasone;Passive range of motion;Manual techniques;Patient/family education;Neuromuscular re-education;Dry needling;Spinal Manipulations;Joint Manipulations    PT Next Visit Plan  continue mobility and strength    PT Home Exercise Plan  See education section    Consulted and Agree with Plan of Care  Patient       Patient will benefit from skilled therapeutic intervention in order to improve the following deficits and impairments:  Decreased balance, Increased muscle spasms, Decreased endurance, Decreased range of motion, Improper body mechanics, Pain, Postural dysfunction, Decreased strength, Decreased safety awareness, Decreased coordination, Decreased activity tolerance  Visit Diagnosis: Chronic right shoulder pain  Stiffness of right shoulder, not elsewhere classified     Problem List Patient Active Problem List   Diagnosis Date Noted  . Goals of care, counseling/discussion 05/02/2017  . Multiple myeloma ([place] 04/19/2017  . Iron deficiency anemia 03/14/2017  . PAD (peripheral  artery disease) ([place] 11/23/2016  . Tobacco use disorder 11/23/2016  . AAA (abdominal aortic aneurysm) without rupture ([place] 11/23/2016  . Acute diastolic CHF (congestive heart failure) ([place] 08/27/2016  . AVB (atrioventricular block) 08/27/2016  . Cardiac pacemaker 08/27/2016  . Leukocytosis 08/27/2016  . Acute posthemorrhagic anemia 08/27/2016  . Iron deficiency anemia due to chronic blood loss 08/27/2016  . Acute on chronic renal insufficiency 08/27/2016  . Joint pain 08/27/2016  . Gout attack 08/27/2016  . Upper GI bleed 08/21/2016  . Allergic state 12/16/2015  . Anxiety 12/16/2015  . Atrial fibrillation ([place] 12/16/2015  . Diabetes mellitus ([place] 12/16/2015  . HLD (hyperlipidemia) 12/16/2015  . BP (high blood pressure) 12/16/2015  . Fatty liver disease, nonalcoholic 024/23/5361 . Apnea, sleep 12/16/2015  . Tubulovillous adenoma polyp of colon 08/19/2015  . Hernia of abdominal cavity 07/31/2015  . Malignant neoplasm of prostate ([place] 02/20/2014  . Rheumatoid arthritis ([place] 01/15/2014    TRachael Fee SPT 07/17/2018, 2:10 PM  [facility] [address] [place] [address]Phone: [phone]  Fax:  [phone] Name: JLennard CapekMRN: [id number] of Birth: 8Oct 26, 1942

## 2018-07-20 ENCOUNTER — Ambulatory Visit: Payer: Medicare Other

## 2018-07-24 ENCOUNTER — Ambulatory Visit: Payer: Medicare Other | Admitting: Podiatry

## 2018-07-24 ENCOUNTER — Encounter: Payer: Self-pay | Admitting: Podiatry

## 2018-07-24 DIAGNOSIS — I739 Peripheral vascular disease, unspecified: Secondary | ICD-10-CM | POA: Diagnosis not present

## 2018-07-24 DIAGNOSIS — B351 Tinea unguium: Secondary | ICD-10-CM | POA: Diagnosis not present

## 2018-07-24 DIAGNOSIS — M79676 Pain in unspecified toe(s): Secondary | ICD-10-CM

## 2018-07-24 DIAGNOSIS — M79609 Pain in unspecified limb: Principal | ICD-10-CM

## 2018-07-24 DIAGNOSIS — D689 Coagulation defect, unspecified: Secondary | ICD-10-CM | POA: Diagnosis not present

## 2018-07-24 NOTE — Progress Notes (Signed)
Complaint:  Visit Type: Patient returns to my office for continued preventative foot care services. Complaint: Patient states" my nails have grown long and thick and become painful to walk and wear shoes" Patient has been diagnosed with DM with pvd and neuropathy.. The patient presents for preventative foot care services. No changes to ROS.    Podiatric Exam: Vascular: dorsalis pedis are palpable  B/L. and posterior tibial pulses are not  palpable bilateral. Capillary return is immediate. Temperature gradient is WNL. Skin turgor WNL  Sensorium: Diminished  Semmes Weinstein monofilament test.  Nail Exam: Pt has thick disfigured discolored nails with subungual debris noted bilateral entire nail hallux through fifth toenails.   Ulcer Exam: There is no evidence of ulcer or pre-ulcerative changes or infection. Orthopedic Exam: Muscle tone and strength are WNL. No limitations in general ROM. No crepitus or effusions noted. Foot type and digits show no abnormalities. Bony prominences are unremarkable. HAV  B/L and hammer toe  B/L.  Pes planus. Skin: No Porokeratosis. No infection or ulcers.  Clavi second toe right asymptomatic  Diagnosis:  Onychomycosis, , Pain in right toe, pain in left toes,    Treatment & Plan Procedures and Treatment: Consent by patient was obtained for treatment procedures. The patient understood the discussion of treatment and procedures well. All questions were answered thoroughly reviewed. Debridement of mycotic and hypertrophic toenails, 1 through 5 bilateral and clearing of subungual debris. No ulceration, no infection noted. Cauterize iatrogenic lesion lateral border right hallux. Return Visit-Office Procedure: Patient instructed to return to the office for a follow up visit 3 months for continued evaluation and treatment.    Gardiner Barefoot DPM

## 2018-08-29 ENCOUNTER — Encounter: Payer: Self-pay | Admitting: Emergency Medicine

## 2018-08-29 ENCOUNTER — Other Ambulatory Visit: Payer: Self-pay

## 2018-08-29 ENCOUNTER — Emergency Department: Payer: Medicare Other

## 2018-08-29 ENCOUNTER — Emergency Department
Admission: EM | Admit: 2018-08-29 | Discharge: 2018-08-29 | Disposition: A | Discharge status: Home / Self Care | Payer: Medicare Other | Attending: Emergency Medicine | Admitting: Emergency Medicine

## 2018-08-29 DIAGNOSIS — R0789 Other chest pain: Secondary | ICD-10-CM

## 2018-08-29 DIAGNOSIS — N189 Chronic kidney disease, unspecified: Secondary | ICD-10-CM | POA: Diagnosis not present

## 2018-08-29 DIAGNOSIS — F17210 Nicotine dependence, cigarettes, uncomplicated: Secondary | ICD-10-CM | POA: Diagnosis not present

## 2018-08-29 DIAGNOSIS — Z79899 Other long term (current) drug therapy: Secondary | ICD-10-CM | POA: Insufficient documentation

## 2018-08-29 DIAGNOSIS — I129 Hypertensive chronic kidney disease with stage 1 through stage 4 chronic kidney disease, or unspecified chronic kidney disease: Secondary | ICD-10-CM | POA: Diagnosis not present

## 2018-08-29 DIAGNOSIS — Z950 Presence of cardiac pacemaker: Secondary | ICD-10-CM | POA: Diagnosis not present

## 2018-08-29 DIAGNOSIS — I4891 Unspecified atrial fibrillation: Secondary | ICD-10-CM | POA: Diagnosis not present

## 2018-08-29 DIAGNOSIS — Z7982 Long term (current) use of aspirin: Secondary | ICD-10-CM | POA: Insufficient documentation

## 2018-08-29 LAB — CBC
HCT: 43.9 % (ref 39.0–52.0)
Hemoglobin: 13.9 g/dL (ref 13.0–17.0)
MCH: 25.8 pg — ABNORMAL LOW (ref 26.0–34.0)
MCHC: 31.7 g/dL (ref 30.0–36.0)
MCV: 81.6 fL (ref 80.0–100.0)
NRBC: 0 % (ref 0.0–0.2)
Platelets: 234 10*3/uL (ref 150–400)
RBC: 5.38 MIL/uL (ref 4.22–5.81)
RDW: 21.2 % — ABNORMAL HIGH (ref 11.5–15.5)
WBC: 14.1 10*3/uL — ABNORMAL HIGH (ref 4.0–10.5)

## 2018-08-29 LAB — BASIC METABOLIC PANEL
Anion gap: 7 (ref 5–15)
BUN: 19 mg/dL (ref 8–23)
CO2: 24 mmol/L (ref 22–32)
Calcium: 9.1 mg/dL (ref 8.9–10.3)
Chloride: 106 mmol/L (ref 98–111)
Creatinine, Ser: 1.15 mg/dL (ref 0.61–1.24)
GFR calc non Af Amer: >60 mL/min (ref >60)
Glucose, Bld: 105 mg/dL — ABNORMAL HIGH (ref 70–99)
POTASSIUM: 4.1 mmol/L (ref 3.5–5.1)
Sodium: 137 mmol/L (ref 135–145)

## 2018-08-29 LAB — TROPONIN I
Troponin I: <0.03 ng/mL (ref <0.03)
Troponin I: <0.03 ng/mL (ref <0.03)

## 2018-08-29 MED ORDER — SODIUM CHLORIDE 0.9% FLUSH
3.0000 mL | Freq: Once | INTRAVENOUS | Status: AC
  Administered 2018-08-29: 3 mL via INTRAVENOUS

## 2018-08-29 NOTE — ED Triage Notes (Signed)
Pt presents via pov from home with chest pain that started this morning. He has squeezing pain that radiates to his neck bilaterally. Denies n/v/sob/diaphoresis. NAD noted.

## 2018-08-29 NOTE — ED Provider Notes (Signed)
[facility] Emergency Department Provider Note   ____________________________________________    I have reviewed the triage vital signs and the nursing notes.   HISTORY  Chief Complaint Chest Pain     HPI Omar [name] is a 78 y.o. male who presents with complaints of chest pain.  Patient reports that around 930 to 10:00 this morning he was watching a video online that he found very funny but then developed significant pressure-like chest pain which lasted approximately 30 to 45 minutes.  Now resolved he is actually frustrated that he came to the emergency department given that he feels well now and has no complaints.  Does have a history of pacemaker but denies a history of coronary artery disease.  Does see Dr. Yancey Flemings of cardiology.  No fevers or chills or shortness of breath or cough.   Past Medical History:  Diagnosis Date  . Anxiety   . Arthritis    rheumatoid  . Atrial fibrillation (HCC)    hx of  . Cancer ([place])   . Chronic kidney disease   . Colon polyp 07-07-15   TUBULAR ADENOMA WITH AT LEAST HIGH-GRADE / Dr Rayann Heman  . Diabetes mellitus without complication ([place])   . Dysrhythmia    bradycardia...Marland KitchenMarland Kitchen2 to 1 heart block  . GERD (gastroesophageal reflux disease)   . Heart murmur    patient unaware of history of murmur  . Hypercholesterolemia   . Hypertension   . Presence of permanent cardiac pacemaker   . Prostate cancer ([place]) 01/01/13, 01/30/14   Gleason 3+4=7, volume 46.6 cc  . Rheumatoid arthritis ([place])   . S/P radiation therapy  04/03/2014 through 06/04/2014                                                      Prostate 7800 cGy in 40 sessions                          . Sleep apnea    uses cpap but it is not in the hospital with him    Patient Active Problem List   Diagnosis Date Noted  . Goals of care, counseling/discussion 05/02/2017  . Multiple myeloma ([place]) 04/19/2017  . Iron deficiency anemia 03/14/2017  . PAD (peripheral artery  disease) ([place]) 11/23/2016  . Tobacco use disorder 11/23/2016  . AAA (abdominal aortic aneurysm) without rupture ([place]) 11/23/2016  . Acute diastolic CHF (congestive heart failure) ([place]) 08/27/2016  . AVB (atrioventricular block) 08/27/2016  . Cardiac pacemaker 08/27/2016  . Leukocytosis 08/27/2016  . Acute posthemorrhagic anemia 08/27/2016  . Iron deficiency anemia due to chronic blood loss 08/27/2016  . Acute on chronic renal insufficiency 08/27/2016  . Joint pain 08/27/2016  . Gout attack 08/27/2016  . Upper GI bleed 08/21/2016  . Allergic state 12/16/2015  . Anxiety 12/16/2015  . Atrial fibrillation ([place]) 12/16/2015  . Diabetes mellitus ([place]) 12/16/2015  . HLD (hyperlipidemia) 12/16/2015  . BP (high blood pressure) 12/16/2015  . Fatty liver disease, nonalcoholic 07/37/1062  . Apnea, sleep 12/16/2015  . Tubulovillous adenoma polyp of colon 08/19/2015  . Hernia of abdominal cavity 07/31/2015  . Malignant neoplasm of prostate ([place]) 02/20/2014  . Rheumatoid arthritis ([place]) 01/15/2014    Past Surgical History:  Procedure Laterality Date  . ABDOMINAL AORTIC ANEURYSM REPAIR  11/2013  . COLON SURGERY  March 2017   Right hemicolectomy for tubulovillous adenoma with high-grade dysplasia.  . COLONOSCOPY WITH PROPOFOL N/A 07/07/2015   Procedure: COLONOSCOPY WITH PROPOFOL;  Surgeon: Josefine Class, MD;  Location: [facility] ENDOSCOPY;  Service: Endoscopy;  Laterality: N/A;  . COLONOSCOPY WITH PROPOFOL N/A 10/28/2016   Procedure: COLONOSCOPY WITH PROPOFOL;  Surgeon: [name], MD;  Location: [facility] ENDOSCOPY;  Service: Endoscopy;  Laterality: N/A;  . ESOPHAGOGASTRODUODENOSCOPY (EGD) WITH PROPOFOL N/A 10/28/2016   Procedure: ESOPHAGOGASTRODUODENOSCOPY (EGD) WITH PROPOFOL;  Surgeon: [name], MD;  Location: [facility] ENDOSCOPY;  Service: Endoscopy;  Laterality: N/A;  . GIVENS CAPSULE STUDY N/A 07/25/2017   Procedure: GIVENS CAPSULE STUDY;  Surgeon: [name], MD;  Location: [facility] ENDOSCOPY;   Service: Gastroenterology;  Laterality: N/A;  . LAPAROSCOPIC RIGHT COLECTOMY Right 08/08/2015   Procedure: LAPAROSCOPIC RIGHT COLECTOMY;  Surgeon:  Bellow, MD;  Location: [facility] ORS;  Service: General;  Laterality: Right;  . NASAL SINUS SURGERY    . PACEMAKER INSERTION Left 08/26/2016   Procedure: INSERTION PACEMAKER;  Surgeon: Isaias Cowman, MD;  Location: [facility] ORS;  Service: Cardiovascular;  Laterality: Left;  . PROSTATE BIOPSY  01/01/13, 01/30/14   Gleason 3+3=6, vol 46.6 cc  . TONSILLECTOMY    . uvula surgery     for sleep apnea    Prior to Admission medications   Medication Sig Start Date End Date Taking? Authorizing Provider  acetaminophen (TYLENOL) 325 MG tablet Take 325 mg by mouth every 6 (six) hours as needed.    [provider]  albuterol (PROVENTIL HFA;VENTOLIN HFA) 108 (90 Base) MCG/ACT inhaler  06/17/18   [provider]  ALPRAZolam Duanne Moron) 0.25 MG tablet Take by mouth 2 (two) times daily as needed.     [provider]  amLODipine-benazepril (LOTREL) 5-20 MG capsule Take 1 capsule by mouth daily.    [provider]  aspirin 81 MG chewable tablet Chew 81 mg by mouth daily.    [provider]  atorvastatin (LIPITOR) 80 MG tablet Take 80 mg by mouth at bedtime.     [provider]  benazepril (LOTENSIN) 20 MG tablet Take 20 mg by mouth daily. 07/10/18   [provider]  benzonatate (TESSALON) 100 MG capsule Take 100 mg by mouth 3 (three) times daily. 07/10/18   [provider]  Butalbital-APAP-Caffeine (FIORICET) 50-300-40 MG CAPS Take 1 capsule every four to six hours as needed    [provider]  Cholecalciferol (D3-50) 50000 units capsule Take 50,000 Units by mouth once a week.  11/07/17   [provider]  clopidogrel (PLAVIX) 75 MG tablet Take by mouth.    [provider]  colchicine 0.6 MG tablet  10/21/17   [provider]  ELIQUIS 2.5 MG TABS tablet  07/19/18    [provider]  fexofenadine (ALLEGRA) 180 MG tablet Take 180 mg by mouth daily.    [provider]  fluticasone (FLONASE) 50 MCG/ACT nasal spray Place 2 sprays into both nostrils daily as needed for rhinitis.     [provider]  Fluticasone Furoate-Vilanterol (BREO ELLIPTA) 100-25 MCG/INH AEPB Inhale 1 puff into the lungs daily.     [provider]  folic acid (FOLVITE) 1 MG tablet Take by mouth.    [provider]  furosemide (LASIX) 20 MG tablet Take 20 mg by mouth daily.    [provider]  gabapentin (NEURONTIN) 300 MG capsule Take 300 mg by mouth 3 (three) times daily. 11/30/17  [provider]  gabapentin (NEURONTIN) 400 MG capsule Take 400 mg by mouth 3 (three) times daily. 06/17/18   [provider]  glucose blood (ONE TOUCH ULTRA TEST) test strip  09/05/17   [provider]  hydroxychloroquine (PLAQUENIL) 200 MG tablet Take 400 mg by mouth daily. 05/03/18   [provider]  isosorbide mononitrate (IMDUR) 30 MG 24 hr tablet Take 30 mg by mouth daily.    [provider]  loperamide (IMODIUM) 2 MG capsule Take 1 capsule (2 mg total) by mouth See admin instructions. With onset of diarrhea, take 40m followed by 226mevery 2 hours resolved. Maximum: 16 mg/day 05/25/17   YuEarlie ServerMD  metFORMIN (GLUCOPHAGE) 500 MG tablet Take 500 mg by mouth daily with breakfast.     [provider]  metoprolol succinate (TOPROL-XL) 25 MG 24 hr tablet Take 25 mg by mouth daily. 05/26/18   [provider]  ondansetron (ZOFRAN) 8 MG tablet Take 1 tablet (8 mg total) 2 (two) times daily as needed by mouth (Nausea or vomiting). 04/19/17   YuEarlie ServerMD  pantoprazole (PROTONIX) 20 MG tablet Take 20 mg by mouth daily. 06/24/18   [provider]  pantoprazole (PROTONIX) 40 MG tablet Take 1 tablet (40 mg total) by mouth 2 (two) times daily. 08/27/16   VaTheodoro GristMD  traMADol-acetaminophen  (UCaroline Sauger37.5-325 MG tablet  07/04/18   [provider]  triamcinolone cream (KENALOG) 0.1 % Apply 1 application topically 2 (two) times daily.    [provider]     Allergies Patient has no known allergies.  Family History  Problem Relation Age of Onset  . Heart attack Mother   . Cirrhosis Father   . Pancreatic cancer Brother   . Diabetes Brother   . Diabetes Daughter        medication induced for cancer treatments  . Cancer Daughter        breast, brain    Social History Social History   Tobacco Use  . Smoking status: Current Every Day Smoker    Packs/day: 0.50    Years: 50.00    Pack years: 25.00    Types: Cigarettes  . Smokeless tobacco: Never Used  . Tobacco comment: DOWN TO 1/2 PPD  Substance Use Topics  . Alcohol use: Yes    Alcohol/week: 1.0 standard drinks    Types: 1 Cans of beer per week    Comment: occasionally  . Drug use: No    Review of Systems  Constitutional: No fever/chills Eyes: No visual changes.  ENT: No sore throat. Cardiovascular: As above Respiratory: Denies shortness of breath. Gastrointestinal: No abdominal pain.  No nausea, no vomiting.   Genitourinary: Negative for dysuria. Musculoskeletal: Negative for back pain. Skin: Negative for rash. Neurological: Negative for headaches    ____________________________________________   PHYSICAL EXAM:  VITAL SIGNS: ED Triage Vitals  Enc Vitals Group     BP 08/29/18 1113 (!) 168/72     Pulse Rate 08/29/18 1113 69     Resp 08/29/18 1113 18     Temp --      Temp src --      SpO2 08/29/18 1113 97 %     Weight 08/29/18 1116 84.8 kg (187 lb)     Height 08/29/18 1116 1.803 m (_0 )     Head Circumference --      Peak Flow --      Pain Score 08/29/18 1114 4     Pain  Loc --      Pain Edu? --      Excl. in [place]? --     Constitutional: Alert and oriented. Eyes: Conjunctivae are normal.   Nose: No congestion/rhinnorhea. Mouth/Throat: Mucous membranes are moist.     Cardiovascular: Normal rate, regular rhythm. Grossly normal heart sounds.  Good peripheral circulation. Respiratory: Normal respiratory effort.  No retractions. Lungs CTAB. Gastrointestinal: Soft and nontender. No distention.   Musculoskeletal: No lower extremity tenderness nor edema.  Warm and well perfused Neurologic:  Normal speech and language. No gross focal neurologic deficits are appreciated.  Skin:  Skin is warm, dry and intact. No rash noted. Psychiatric: Mood and affect are normal. Speech and behavior are normal.  ____________________________________________   LABS (all labs ordered are listed, but only abnormal results are displayed)  Labs Reviewed  BASIC METABOLIC PANEL - Abnormal; Notable for the following components:      Result Value   Glucose, Bld 105 (*)    All other components within normal limits  CBC - Abnormal; Notable for the following components:   WBC 14.1 (*)    MCH 25.8 (*)    RDW 21.2 (*)    All other components within normal limits  TROPONIN I  TROPONIN I   ____________________________________________  EKG  ED ECG REPORT I, Lavonia Drafts, the attending physician, personally viewed and interpreted this ECG.  Date: 08/29/2018  Rhythm: Ventricular paced rhythm QRS Axis: Abnormal Intervals: Abnormal ST/T Wave abnormalities: Nonspecific changes Narrative Interpretation: no evidence of acute ischemia  ____________________________________________  RADIOLOGY  Chest x-ray is unremarkable ____________________________________________   PROCEDURES  Procedure(s) performed: No  Procedures   Critical Care performed: No ____________________________________________   INITIAL IMPRESSION / ASSESSMENT AND PLAN / ED COURSE  Pertinent labs & imaging results that were available during my care of the patient were reviewed by me and considered in my medical decision making (see chart for details).  Patient well-appearing and in no acute distress.   However concerning chest pain which lasted approximately 30 minutes earlier today.  EKG is unchanged from prior, lab work is significant for mildly elevated white blood cell count which is nonspecific initial troponin is normal.  Will check second troponin as the patient has no interest in staying in the hospital and given that he is asymptomatic now for this is a reasonable plan  Second troponin is normal, patient remains chest pain-free, best and follow-up closely with his cardiologist strict return precautions    ____________________________________________   FINAL CLINICAL IMPRESSION(S) / ED DIAGNOSES  Final diagnoses:  Atypical chest pain        Note:  This document was prepared using Dragon voice recognition software and may include unintentional dictation errors.   Lavonia Drafts, MD 08/29/18 [phone]

## 2018-09-06 ENCOUNTER — Telehealth: Payer: Self-pay

## 2018-09-06 NOTE — Telephone Encounter (Signed)
Telephone call to patient to discuss SCP visit.  Due to Coronavirus precautions, telephone visit is recommended.  SCP and treatment summary along with ASCO booklet mailed to patient and will call patient next week on April 8th to reviewed SCP and answer questions.  APP will also talk to patient.

## 2018-09-12 ENCOUNTER — Other Ambulatory Visit: Payer: Self-pay

## 2018-09-13 ENCOUNTER — Inpatient Hospital Stay: Payer: Medicare Other

## 2018-09-13 ENCOUNTER — Inpatient Hospital Stay: Payer: Medicare Other | Attending: Oncology | Admitting: Oncology

## 2018-09-13 ENCOUNTER — Other Ambulatory Visit: Payer: Self-pay

## 2018-09-13 DIAGNOSIS — C61 Malignant neoplasm of prostate: Secondary | ICD-10-CM | POA: Diagnosis not present

## 2018-09-13 DIAGNOSIS — C9000 Multiple myeloma not having achieved remission: Secondary | ICD-10-CM

## 2018-09-13 DIAGNOSIS — C9001 Multiple myeloma in remission: Secondary | ICD-10-CM | POA: Insufficient documentation

## 2018-09-13 DIAGNOSIS — R7989 Other specified abnormal findings of blood chemistry: Secondary | ICD-10-CM | POA: Insufficient documentation

## 2018-09-13 DIAGNOSIS — Z800 Family history of malignant neoplasm of digestive organs: Secondary | ICD-10-CM | POA: Insufficient documentation

## 2018-09-13 DIAGNOSIS — Z808 Family history of malignant neoplasm of other organs or systems: Secondary | ICD-10-CM | POA: Insufficient documentation

## 2018-09-13 DIAGNOSIS — Z9049 Acquired absence of other specified parts of digestive tract: Secondary | ICD-10-CM | POA: Insufficient documentation

## 2018-09-13 DIAGNOSIS — R8290 Unspecified abnormal findings in urine: Secondary | ICD-10-CM | POA: Insufficient documentation

## 2018-09-13 DIAGNOSIS — Z79899 Other long term (current) drug therapy: Secondary | ICD-10-CM | POA: Insufficient documentation

## 2018-09-13 DIAGNOSIS — Z8546 Personal history of malignant neoplasm of prostate: Secondary | ICD-10-CM | POA: Insufficient documentation

## 2018-09-13 DIAGNOSIS — Z833 Family history of diabetes mellitus: Secondary | ICD-10-CM | POA: Insufficient documentation

## 2018-09-13 DIAGNOSIS — F17210 Nicotine dependence, cigarettes, uncomplicated: Secondary | ICD-10-CM | POA: Diagnosis not present

## 2018-09-13 DIAGNOSIS — Z803 Family history of malignant neoplasm of breast: Secondary | ICD-10-CM | POA: Insufficient documentation

## 2018-09-13 DIAGNOSIS — F17200 Nicotine dependence, unspecified, uncomplicated: Secondary | ICD-10-CM

## 2018-09-13 DIAGNOSIS — R197 Diarrhea, unspecified: Secondary | ICD-10-CM | POA: Insufficient documentation

## 2018-09-13 DIAGNOSIS — E1122 Type 2 diabetes mellitus with diabetic chronic kidney disease: Secondary | ICD-10-CM | POA: Insufficient documentation

## 2018-09-13 DIAGNOSIS — Z950 Presence of cardiac pacemaker: Secondary | ICD-10-CM | POA: Insufficient documentation

## 2018-09-13 DIAGNOSIS — Z7902 Long term (current) use of antithrombotics/antiplatelets: Secondary | ICD-10-CM | POA: Insufficient documentation

## 2018-09-13 DIAGNOSIS — Z7289 Other problems related to lifestyle: Secondary | ICD-10-CM | POA: Insufficient documentation

## 2018-09-13 DIAGNOSIS — Z8249 Family history of ischemic heart disease and other diseases of the circulatory system: Secondary | ICD-10-CM | POA: Insufficient documentation

## 2018-09-13 DIAGNOSIS — Z86010 Personal history of colonic polyps: Secondary | ICD-10-CM | POA: Insufficient documentation

## 2018-09-13 DIAGNOSIS — M069 Rheumatoid arthritis, unspecified: Secondary | ICD-10-CM | POA: Insufficient documentation

## 2018-09-13 DIAGNOSIS — K922 Gastrointestinal hemorrhage, unspecified: Secondary | ICD-10-CM | POA: Insufficient documentation

## 2018-09-13 DIAGNOSIS — I700 Atherosclerosis of aorta: Secondary | ICD-10-CM | POA: Insufficient documentation

## 2018-09-13 DIAGNOSIS — D500 Iron deficiency anemia secondary to blood loss (chronic): Secondary | ICD-10-CM | POA: Insufficient documentation

## 2018-09-13 DIAGNOSIS — I129 Hypertensive chronic kidney disease with stage 1 through stage 4 chronic kidney disease, or unspecified chronic kidney disease: Secondary | ICD-10-CM | POA: Insufficient documentation

## 2018-09-13 DIAGNOSIS — Z7901 Long term (current) use of anticoagulants: Secondary | ICD-10-CM | POA: Insufficient documentation

## 2018-09-13 DIAGNOSIS — Z9221 Personal history of antineoplastic chemotherapy: Secondary | ICD-10-CM

## 2018-09-13 DIAGNOSIS — Z923 Personal history of irradiation: Secondary | ICD-10-CM | POA: Insufficient documentation

## 2018-09-13 DIAGNOSIS — G8929 Other chronic pain: Secondary | ICD-10-CM | POA: Insufficient documentation

## 2018-09-13 DIAGNOSIS — N183 Chronic kidney disease, stage 3 (moderate): Secondary | ICD-10-CM | POA: Insufficient documentation

## 2018-09-13 NOTE — Progress Notes (Signed)
Survivorship Care Plan visit completed.  Treatment summary reviewed and given to patient.  ASCO answers booklet reviewed and given to patient.  CARE program and Cancer Transitions discussed with patient along with other resources cancer center offers to patients and caregivers.  Patient verbalized understanding.    

## 2018-09-13 NOTE — Progress Notes (Signed)
[place] note [facility]  Telephone:([phone]) [phone] Fax:([phone]) [phone]  Patient Care Team: Jodi Marble, MD as PCP - General (Internal Medicine) Bary Castilla, Forest Gleason, MD (General Surgery) Edrick Kins, MD as Rounding Team (Internal Medicine) Hillary Bow, MD as Consulting Physician (Internal Medicine) [name], MD as Surgeon (Gastroenterology) Isaias Cowman, MD as Consulting Physician (Cardiology) Earlie Server, MD as Consulting Physician (Oncology)   Name of the patient: Omar [name]  [id number]  December 13, 1940   Date of visit: 09/19/18  CLINIC:  Telephone Survivorship Visit  REASON FOR VISIT:  Survivorship Care Plan visit & follow-up post-treatment for a recent history of Multiple myeloma/Smouldering Myeloma  BRIEF ONCOLOGIC HISTORY:  Oncology History   78 y.o.  male with PMH listed below who presents to follow up on the evaluation and management of his abnormal urine protein electrophoresis results. I reviewed the records from [facility], [place] and [place] was performed and HemOnc related medical problems are listed below.  He lives with a friend. He has 3 adult children.   1 Chronic Kidney disease, Stage III: patient follows up with Dr.Lateef.  2 Proteinuria:  02/18/2017: Albumin/Creatinine ratio was 7, Urine protein electrophoresis,Random Urine revealed M Spike 43.2%, total protein of 43.64m/dl,  01/05/2017 Albumin/Creatinine ratio was 455.2, Albumin 996.5, , 2.15.2018 Albumin/Creatinine ratio was 80.3, Albumin 73.3, Urine protein electrophoresis,Random Urine revealed M Spike 29.7%, total protein of 29.16mdl, 06/30/2016 Serum protein electrophoresis did not detect M spike.  Autoimmune disorder work up showed Negative anti dsDNA, RNP antibodies, smith antibody, Sjogren antibodies, anti Jo-1, positive antichromotin antibodies, positive ANA,  3 Anemia of chronic kidney  disease:  4 Iron deficiency anemia: history of iron deficiency, ferritin was 7 on 08/11/2016. He had EGD and colonoscopy that were done this year which showed duodenitis/esphagitis/diverticulosis/benigh polyps. 5 Rheumatoid arthritis: he follows up with Dr.Kernodle and is on chronic MTX. He Is currently off MTX reports joint pains are controlled, not quite symptomatic.   Patient denies any persistent bone pain or any pain. He continue to feel lack of energy, persistent, not improved with resting or taking naps. He also has lost 25 pounds in the past year which was unintentional.   # Rheumatoid arthritis and currently is off MTX.  # It is ambiguous whether he has truly symptomatic multiple myeloma or a smoldering myeloma, given that anemia can be secondary to iron deficiency and CKD.  The hypermetabolic 2.1 cm hypermetabolic soft tissue lesion in the left heel, can reflect a Plasmacytoma,which should have been biopsied. However patient initially denies being on any blood thinner, later he was found to be on Plavix with pre biopsy questionnaire screening, and biopsy was held. Patient has to contact cardiologist to hold plavix for seven days before procedure. He feels frustrated about multiple workup and meanwhile he becomes more fatigues, with worsening of kidney function. Patient was reluctant about proceeding additional invasive procedures and wants to start treatment. Decision was made to start active MM treatment.   # [facility] bone marrow transplant. He is not considered to  good candidate for transplant. Patient cardiologist Dr. KaChancy Milroyad started patient on Lasix 20 mg daily. [facility] as patient was having dizziness and borderline blood pressure during his clinic visit there.  # [facility]     Malignant neoplasm of prostate [place]  02/20/2014 Initial Diagnosis  Malignant neoplasm of prostate ([place])     Multiple myeloma ([place])   04/19/2017 Initial Diagnosis    Multiple myeloma ([place])     INTERVAL HISTORY:  I connected with Kristin Bruins on 09/15/18 at 1:30pm by telephone visit and verified that I am speaking with the correct person using two identifiers.   I discussed the limitations, risks, security and privacy concerns of performing an evaluation and management service by telemedicine and the availability of in-person appointments. I also discussed with the patient that there may be a patient responsible charge related to this service. The patient expressed understanding and agreed to proceed.   Other persons participating in the visit and their role in the encounter: None  Patient's location: Home  Provider's location: [place]   Chief Complaint: Survivorship Visit  [name] presents to the [facility] today for our initial meeting to review the survivorship care plan detailing his treatment course for Multiple Myeloma, as well as monitoring long-term side effects of that treatment, education regarding health maintenance, screening, and overall wellness and health promotion.     Overall, Mr.Antionette Fairy reports feeling  quite well since completing his RVD X 4 cycles completing on 07/27/17.  Treatment was dose reduced given renal insufficiency and history of his diabetes.  Given multiple comorbidities and side effects thought to be due to cytopenias during his 4 cycles, Dr. Tasia Catchings did not recommend maintenance Revlimid.  There is still uncertainty if this is true multiple myeloma versus smoldering myeloma given anemia could be d/t CKD and IDA.   Complains of neuropathy in bilateral lower extremities.  On gabapentin 300 mg 3 times daily.  States this helps some but neuropathy is intermittently worse at times.     ADDITIONAL REVIEW OF SYSTEMS:  Review of Systems  Constitutional: Positive for malaise/fatigue. Negative  for chills, fever and weight loss.  HENT: Negative for congestion, ear pain and tinnitus.   Eyes: Negative.  Negative for blurred vision and double vision.  Respiratory: Positive for shortness of breath. Negative for cough and sputum production.   Cardiovascular: Negative.  Negative for chest pain, palpitations and leg swelling.  Gastrointestinal: Negative.  Negative for abdominal pain, constipation, diarrhea, nausea and vomiting.  Genitourinary: Positive for frequency and urgency. Negative for dysuria.  Musculoskeletal: Negative for back pain and falls.  Skin: Negative.  Negative for rash.  Neurological: Positive for sensory change and weakness. Negative for headaches.  Endo/Heme/Allergies: Negative.  Does not bruise/bleed easily.  Psychiatric/Behavioral: Negative.  Negative for depression. The patient is not nervous/anxious and does not have insomnia.      PAST MEDICAL/SURGICAL HISTORY:  Past Medical History:  Diagnosis Date  . Anxiety   . Arthritis    rheumatoid  . Atrial fibrillation (HCC)    hx of  . Cancer ([place])   . Chronic kidney disease   . Colon polyp 07-07-15   TUBULAR ADENOMA WITH AT LEAST HIGH-GRADE / Dr Rayann Heman  . Diabetes mellitus without complication ([place])   . Dysrhythmia    bradycardia...Marland KitchenMarland Kitchen2 to 1 heart block  . GERD (gastroesophageal reflux disease)   . Heart murmur    patient unaware of history of murmur  . Hypercholesterolemia   . Hypertension   . Presence of permanent cardiac pacemaker   . Prostate cancer ([place]) 01/01/13, 01/30/14   Gleason 3+4=7, volume 46.6 cc  . Rheumatoid arthritis ([place])   . S/P radiation therapy  04/03/2014 through 06/04/2014  Prostate 7800 cGy in 40 sessions                          . Sleep apnea    uses cpap but it is not in the hospital with him   Past Surgical History:  Procedure Laterality Date  . ABDOMINAL AORTIC ANEURYSM REPAIR  11/2013  . COLON SURGERY  March 2017   Right  hemicolectomy for tubulovillous adenoma with high-grade dysplasia.  . COLONOSCOPY WITH PROPOFOL N/A 07/07/2015   Procedure: COLONOSCOPY WITH PROPOFOL;  Surgeon: Josefine Class, MD;  Location[facility] ENDOSCOPY;  Service: Endoscopy;  Laterality: N/A;  . COLONOSCOPY WITH PROPOFOL N/A 10/28/2016   Procedure: COLONOSCOPY WITH PROPOFOL;  Surgeon: [name], MD;  Location: [facility] ENDOSCOPY;  Service: Endoscopy;  Laterality: N/A;  . ESOPHAGOGASTRODUODENOSCOPY (EGD) WITH PROPOFOL N/A 10/28/2016   Procedure: ESOPHAGOGASTRODUODENOSCOPY (EGD) WITH PROPOFOL;  Surgeon: [name], MD;  Location: [facility] ENDOSCOPY;  Service: Endoscopy;  Laterality: N/A;  . GIVENS CAPSULE STUDY N/A 07/25/2017   Procedure: GIVENS CAPSULE STUDY;  Surgeon: [name], MD;  Location[facility] ENDOSCOPY;  Service: Gastroenterology;  Laterality: N/A;  . LAPAROSCOPIC RIGHT COLECTOMY Right 08/08/2015   Procedure: LAPAROSCOPIC RIGHT COLECTOMY;  Surgeon: Robert Bellow, MD;  Location: [facility] ORS;  Service: General;  Laterality: Right;  . NASAL SINUS SURGERY    . PACEMAKER INSERTION Left 08/26/2016   Procedure: INSERTION PACEMAKER;  Surgeon: Isaias Cowman, MD;  Location: [facility] ORS;  Service: Cardiovascular;  Laterality: Left;  . PROSTATE BIOPSY  01/01/13, 01/30/14   Gleason 3+3=6, vol 46.6 cc  . TONSILLECTOMY    . uvula surgery     for sleep apnea     ALLERGIES:  No Known Allergies    CURRENT MEDICATIONS:  Current Outpatient Medications on File Prior to Visit  Medication Sig Dispense Refill  . acetaminophen (TYLENOL) 325 MG tablet Take 325 mg by mouth every 6 (six) hours as needed.    Marland Kitchen albuterol (PROVENTIL HFA;VENTOLIN HFA) 108 (90 Base) MCG/ACT inhaler     . ALPRAZolam (XANAX) 0.25 MG tablet Take by mouth 2 (two) times daily as needed.     Marland Kitchen amLODipine-benazepril (LOTREL) 5-20 MG capsule Take 1 capsule by mouth daily.    Marland Kitchen aspirin 81 MG chewable tablet Chew 81 mg by mouth daily.    Marland Kitchen atorvastatin (LIPITOR) 80 MG tablet Take 80  mg by mouth at bedtime.     . benazepril (LOTENSIN) 20 MG tablet Take 20 mg by mouth daily.    . benzonatate (TESSALON) 100 MG capsule Take 100 mg by mouth 3 (three) times daily.    . Butalbital-APAP-Caffeine (FIORICET) 50-300-40 MG CAPS Take 1 capsule every four to six hours as needed    . Cholecalciferol (D3-50) 50000 units capsule Take 50,000 Units by mouth once a week.     . clopidogrel (PLAVIX) 75 MG tablet Take by mouth.    . colchicine 0.6 MG tablet     . ELIQUIS 2.5 MG TABS tablet     . fexofenadine (ALLEGRA) 180 MG tablet Take 180 mg by mouth daily.    . fluticasone (FLONASE) 50 MCG/ACT nasal spray Place 2 sprays into both nostrils daily as needed for rhinitis.     . Fluticasone Furoate-Vilanterol (BREO ELLIPTA) 100-25 MCG/INH AEPB Inhale 1 puff into the lungs daily.     . folic acid (FOLVITE) 1 MG tablet Take by mouth.    . furosemide (LASIX) 20 MG tablet Take 20 mg by  mouth daily.    Marland Kitchen gabapentin (NEURONTIN) 300 MG capsule Take 300 mg by mouth 3 (three) times daily.  2  . gabapentin (NEURONTIN) 400 MG capsule Take 400 mg by mouth 3 (three) times daily.    Marland Kitchen glucose blood (ONE TOUCH ULTRA TEST) test strip     . hydroxychloroquine (PLAQUENIL) 200 MG tablet Take 400 mg by mouth daily.  2  . isosorbide mononitrate (IMDUR) 30 MG 24 hr tablet Take 30 mg by mouth daily.    Marland Kitchen loperamide (IMODIUM) 2 MG capsule Take 1 capsule (2 mg total) by mouth See admin instructions. With onset of diarrhea, take 48m followed by 27mevery 2 hours resolved. Maximum: 16 mg/day 120 capsule 0  . metFORMIN (GLUCOPHAGE) 500 MG tablet Take 500 mg by mouth daily with breakfast.     . metoprolol succinate (TOPROL-XL) 25 MG 24 hr tablet Take 25 mg by mouth daily.    . ondansetron (ZOFRAN) 8 MG tablet Take 1 tablet (8 mg total) 2 (two) times daily as needed by mouth (Nausea or vomiting). 30 tablet 1  . pantoprazole (PROTONIX) 20 MG tablet Take 20 mg by mouth daily.    . pantoprazole (PROTONIX) 40 MG tablet Take 1  tablet (40 mg total) by mouth 2 (two) times daily. 60 tablet 3  . traMADol-acetaminophen (ULTRACET) 37.5-325 MG tablet     . triamcinolone cream (KENALOG) 0.1 % Apply 1 application topically 2 (two) times daily.     No current facility-administered medications on file prior to visit.        ONCOLOGIC FAMILY HISTORY:  Family History  Problem Relation Age of Onset  . Heart attack Mother   . Cirrhosis Father   . Pancreatic cancer Brother   . Diabetes Brother   . Diabetes Daughter        medication induced for cancer treatments  . Cancer Daughter        breast, brain      SOCIAL HISTORY:  Mr. GrSoderholms married and lives with his spouse in [place] Mr. GrEbertis currently retired.  he denies any current use of tobacco, alcohol, or illicit drug use.    PHYSICAL EXAMINATION: Telephone Visit-Deferred Vital Signs: There were no vitals filed for this visit.    LABORATORY DATA:  08/29/18: Lab Results  Component Value Date   WBC 14.1 (H) 08/29/2018   HGB 13.9 08/29/2018   HCT 43.9 08/29/2018   MCV 81.6 08/29/2018   PLT 234 08/29/2018     Chemistry      Component Value Date/Time   NA 137 08/29/2018 1149   NA 141 11/08/2013 0444   K 4.1 08/29/2018 1149   K 4.0 11/08/2013 0444   CL 106 08/29/2018 1149   CL 110 (H) 11/08/2013 0444   CO2 24 08/29/2018 1149   CO2 24 11/08/2013 0444   BUN 19 08/29/2018 1149   BUN 13 11/08/2013 0444   CREATININE 1.15 08/29/2018 1149   CREATININE 1.02 11/08/2013 0444      Component Value Date/Time   CALCIUM 9.1 08/29/2018 1149   CALCIUM 8.1 (L) 11/08/2013 0444   ALKPHOS 79 05/15/2018 0925   ALKPHOS 71 11/08/2013 0444   AST 18 05/15/2018 0925   AST 22 11/08/2013 0444   ALT 16 05/15/2018 0925   ALT 27 11/08/2013 0444   BILITOT 0.5 05/15/2018 0925   BILITOT 0.5 11/08/2013 0444      MM Panel:  M spike: Not observed.  IgG 2120  DIAGNOSTIC IMAGING:  PET Scan: 08/24/17 IMPRESSION: 1. No hypermetabolic osseous  lesions. No definite signs of multiple myeloma on CT imaging. 2. Interval improvement in soft tissue activity along the medial aspect of the left calcaneal tuberosity, likely plantar fasciitis. 3. New small left pleural effusion. 4. Bilateral inguinal hernias containing small bowel on the right and sigmoid colon on the left. No evidence of incarceration or obstruction. 5. Otherwise stable incidental findings including diffuse atherosclerosis, bilateral renal cysts, emphysema and sigmoid diverticulosis.  ASSESSMENT AND PLAN:  Mr. Meikle is a pleasant 78 y.o. male with history of MM, treated with 4 cycles RVD; completed treatment on 07/27/17.  Patient presents to survivorship clinic today for routine follow-up after finishing treatment.    1. Multiple Myeloma:  Mr. Hardman is continuing to recover from the effects of cancer treatment.  Per surveillance protocol, he will follow-up with his primary oncologist as scheduled on 09/22/18 with labs and 09/29/18 for assessment.  Today, a comprehensive survivorship care plan and treatment summary was reviewed with the patient today detailing his diagnosis, treatment course, potential late/long-term effects of treatment, appropriate follow-up care with recommendations for the future, and patient education resources.  A copy of this summary, along with a letter will be sent to the patient's primary care provider via mail/fax/In Basket message after today's visit.  Mr. Gehres is welcome to return to the Survivorship Clinic in the future, as needed.    #. Problem(s) at Visit Neuropathy X 8 months. Currently on Gabapentin 300 mg TID.   Encouraged Mr. Lemay to reach out to PCP regarding uncontrolled bilateral neuropathy.  Unclear of who is prescribing this medication.  There is room for dose adjustment to his gabapentin.  Spoke at length about acupuncture should gabapentin not provide symptomatic relief.  Given global pandemic COVID-19, would recommend  waiting several months before initiating new referral for acupuncture.  We will touch base with patient in June 2020 to see if symptoms have improved.  He is in agreeance.  #.  Lung cancer screening:  [place] now offers eligible patients lung cancer screening with a low-dose chest CT to aid in early detection, provide more effective treatment options, and ultimately improve survival benefits for patients diagnosed early.  Below is the selection criteria for screening:  . Medicare patients: 55-77 years; privately insured patients 55-80 years. . Active or former smokers who have quit within the last 15 years. . 30+ pack-year history of smoking  . Exclusion criteria - No signs/symptoms of lung cancer (i.e., no recent history of hemoptysis and no unexplained weight loss >15 pounds in the last 6 months). . Willing and healthy enough to undergo biopsies/surgery if needed.  Mr. Sahlin currently does meet criteria for lung cancer screening.  Therefore, I have placed a referral for screening today.    #. Tobacco & alcohol use: Mr. Gaskill reports that he continues to smoke cigarettes.  I also reinforced the importance of avoiding alcohol consumption as well.  We discussed that smoking  also increases the risk of other cancers, as well.  Mr. Klaiber states that he voiced understanding of the importance of smoking cessation.  #. Health maintenance and wellness promotion: Cancer patients who consume a diet rich in fruits and vegetables have better overall health and decreased risk of cancer recurrence. Mr. Somers was encouraged to consume 5-7 servings of fruits and vegetables per day, as tolerated. We reviewed the "Nutrition Rainbow" handout. he was also encouraged to engage in moderate to vigorous exercise for 30  minutes per day most days of the week.   #. Support services/counseling: It is not uncommon for this period of the patient's cancer care trajectory to be one of many emotions and  stressors.  We discussed an opportunity for him to participate in Cancer Transitions.   Mr. Gaglio was encouraged to take advantage of our many other support services programs, support groups, and/or counseling in coping with his new life as a cancer survivor after completing anti-cancer treatment.  he was offered support today through active listening and expressive supportive counseling.  he was given information regarding our available services and encouraged to contact me with any questions or for help enrolling in any of our support group/programs.     Dispo:  -See Dr. Tasia Catchings on 09/29/18. -Labs on 09/22/18. -Consider referring the patient back to survivorship for long-term surveillance, when clinically appropriate.    A total of (25) minutes of non face-to-face time was spent with this patient with greater than 50% of that time in counseling and care-coordination.   Faythe Casa, AGNP-C [place] [facility] [phone] (work cell) [phone] (office) 09/19/18 2:04 PM     Note: PRIMARY CARE PROVIDER Jodi Marble, MD EKIYJ:[phone] Fax: [phone]

## 2018-09-20 ENCOUNTER — Telehealth: Payer: Self-pay | Admitting: *Deleted

## 2018-09-20 NOTE — Telephone Encounter (Signed)
Contacted related to referral for lung cancer screening scan. Patient is aware he will be contacted when current restrictions make screening scan available. Patient verbalizes understanding.

## 2018-09-21 IMAGING — CR DG BONE SURVEY MET
7 of 10 series · 7 of 10 positions shown · non-contrast
Comparison: CT Abdomen and Pelvis 07/27/2016. Chest radiographs
08/23/2016 and earlier.

ADDENDUM:
CORRECTION, the Clinical Data section should begin:
"76-year-old  MALE with PROTEINURIA."
CLINICAL DATA: 76-year-old female with protein urea. Possible
multiple myeloma.

EXAM:
METASTATIC BONE SURVEY

[chest pa]
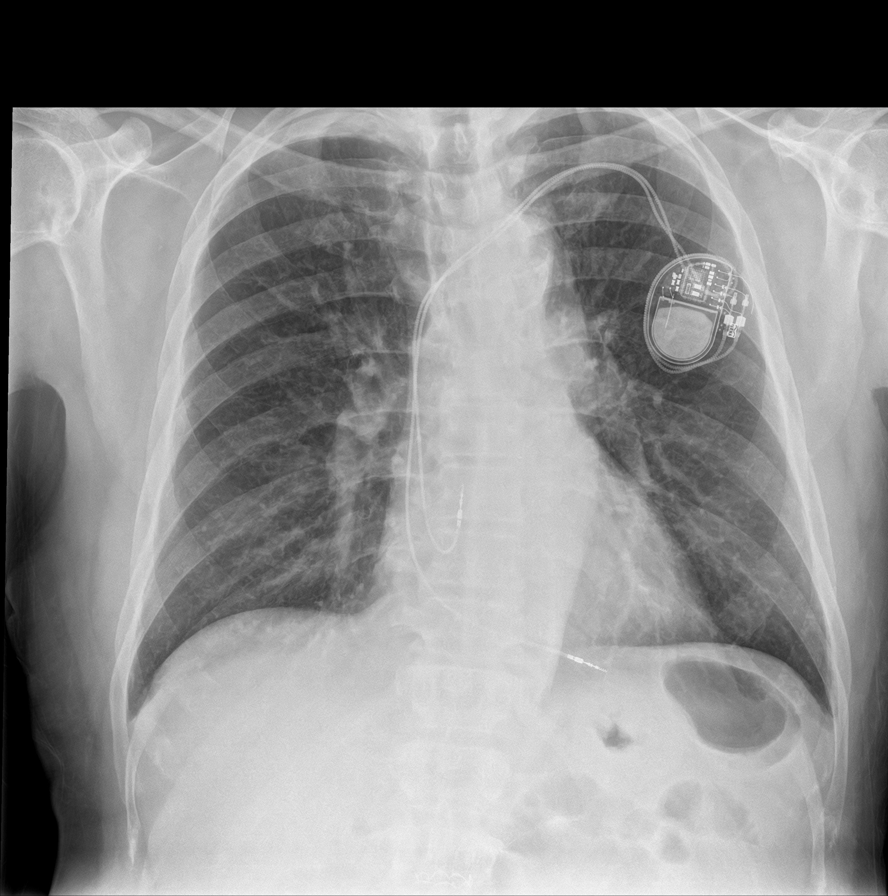

[c-spine lat]
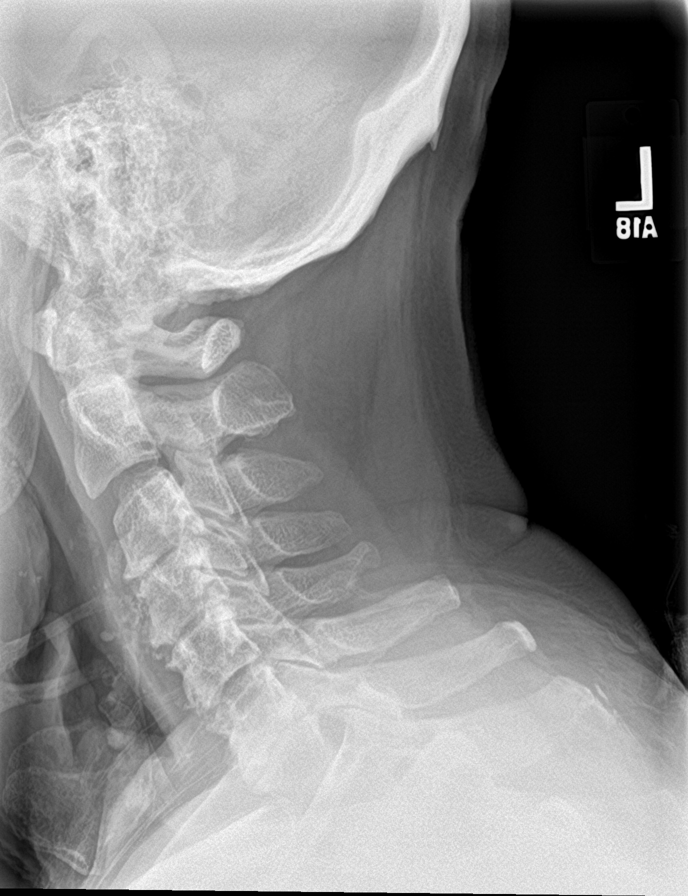

[c-spine swimmers]
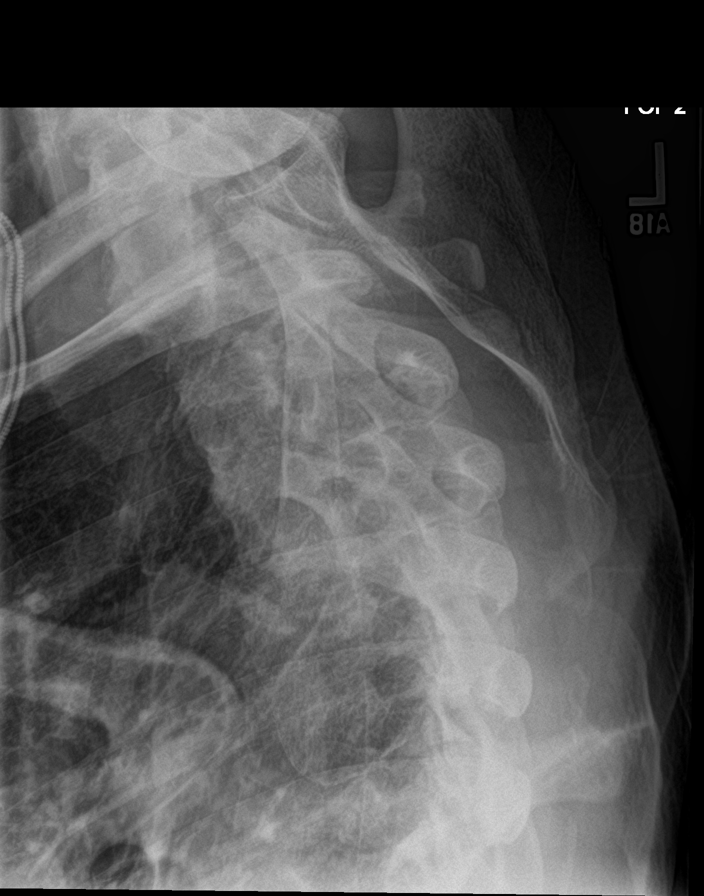

[skull lat]
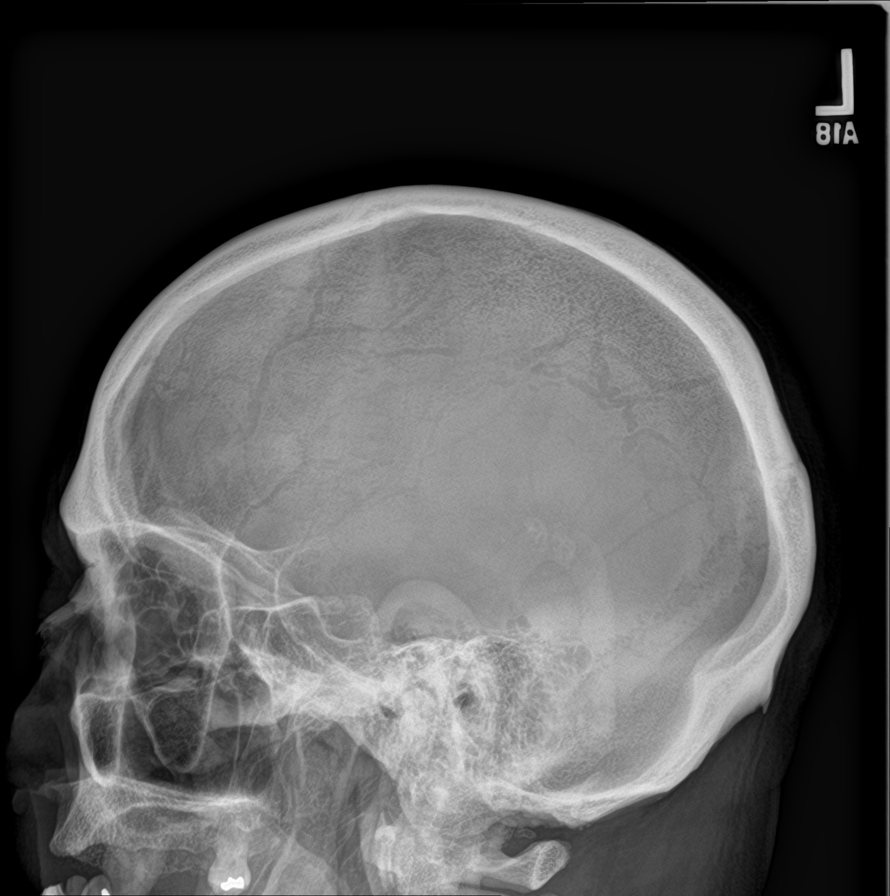

[c-spine ap]
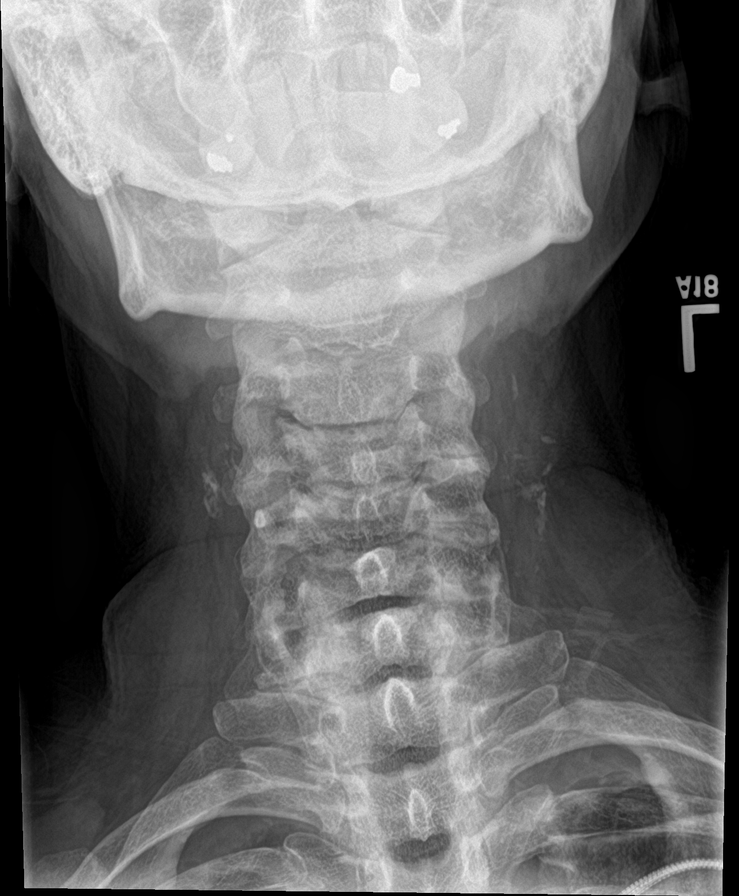

[shoulder ap (1 of 2)]
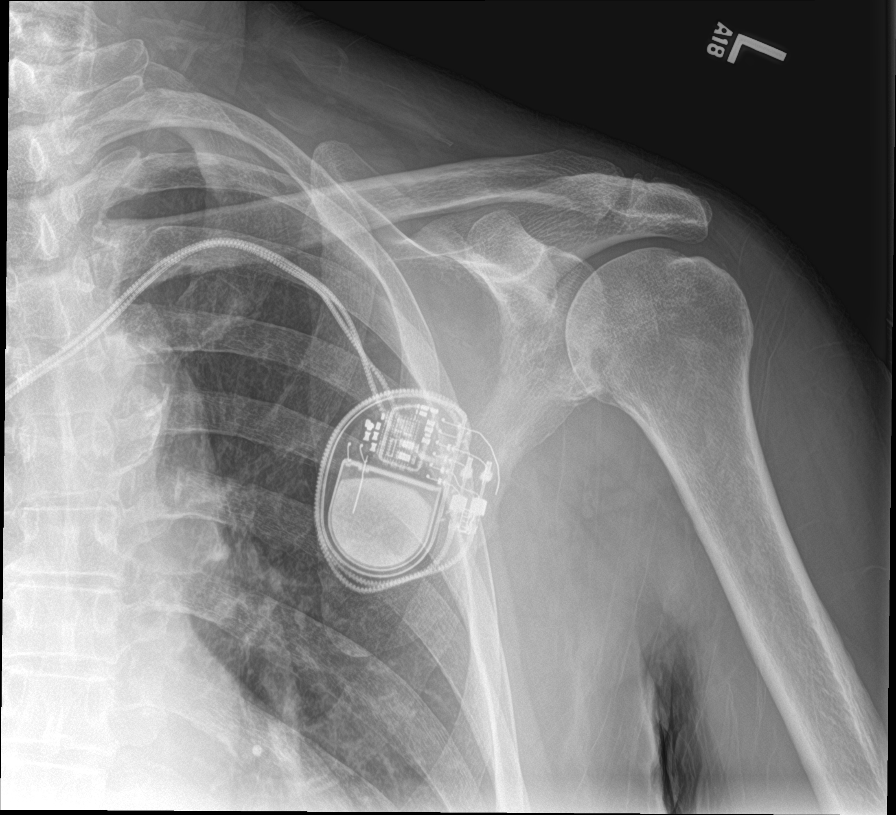

[shoulder ap (2 of 2)]
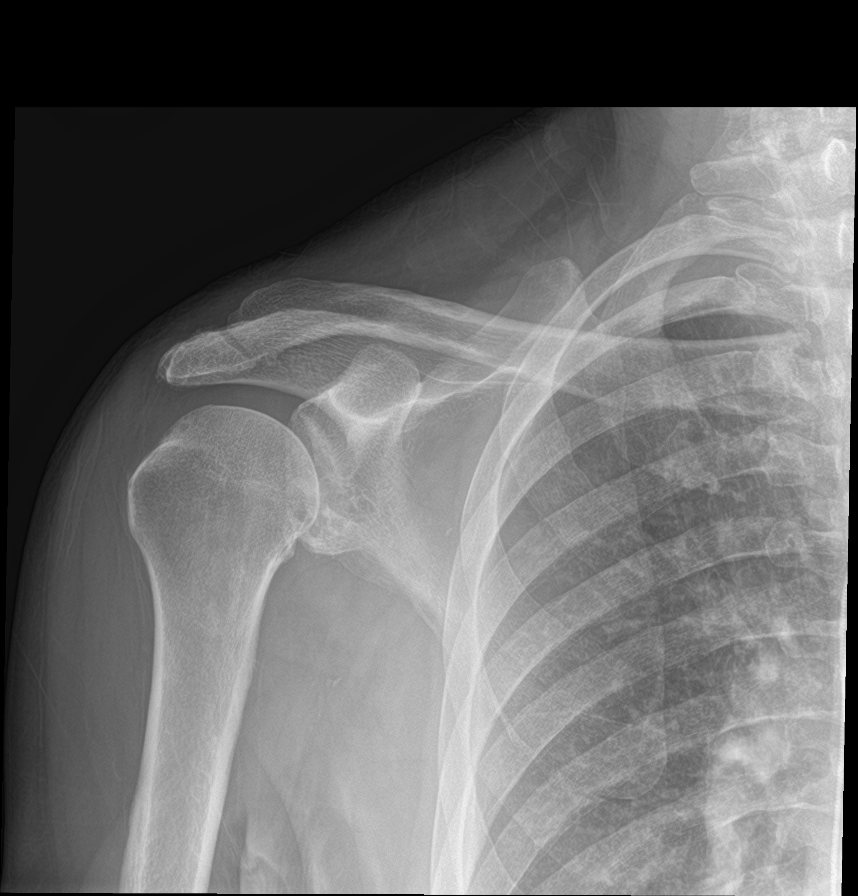

[7 of 10 positions shown; findings below may reference images not displayed]

FINDINGS: Calvarium bone mineralization is within normal limits.

Chronic disc and endplate degeneration in the cervical spine with
straightening of lordosis and mild spondylolisthesis. Cervical spine
bone mineralization within normal limits.

Cervicothoracic junction alignment is within normal limits. Normal
thoracic spine segmentation. Normal thoracic vertebral height,
alignment, and bone mineralization.

Normal lumbar spine segmentation and mineralization. Stable lumbar
vertebral height and alignment with grade 1 anterolisthesis at
L4-L5.

Left chest cardiac pacemaker is new since [REDACTED]. Mediastinal
contours are stable. Rib bone mineralization appears within normal
limits. Pelvis bone mineralization is within normal limits. Negative
abdominal and pelvic visceral contours. Small surgical clips at the
symphysis pubis.

Degenerative changes at both glenohumeral joints. There is a
circumscribed lucent lesion measuring 15-16 mm in the distal right
ulna. This has a benign appearance, and bilateral upper extremity
bone mineralization is otherwise normal.

Bilateral lower extremity bone mineralization is normal.
IMPRESSION: Nonspecific lucent lesion in the distal right ulna measuring 15-16
mm, but otherwise normal bone mineralization for age throughout the
visible skeleton.
A solitary lytic lesion in a distal extremity would be an unusual
presentation of multiple myeloma, and I favor the distal right ulna
lesion is benign.
Recommend correlation with serum and urine protein electrophoresis.

## 2018-09-22 ENCOUNTER — Inpatient Hospital Stay: Payer: Medicare Other

## 2018-09-22 ENCOUNTER — Other Ambulatory Visit: Payer: Self-pay

## 2018-09-22 DIAGNOSIS — M069 Rheumatoid arthritis, unspecified: Secondary | ICD-10-CM | POA: Diagnosis not present

## 2018-09-22 DIAGNOSIS — K922 Gastrointestinal hemorrhage, unspecified: Secondary | ICD-10-CM | POA: Diagnosis not present

## 2018-09-22 DIAGNOSIS — Z79899 Other long term (current) drug therapy: Secondary | ICD-10-CM | POA: Diagnosis not present

## 2018-09-22 DIAGNOSIS — I700 Atherosclerosis of aorta: Secondary | ICD-10-CM | POA: Diagnosis not present

## 2018-09-22 DIAGNOSIS — D500 Iron deficiency anemia secondary to blood loss (chronic): Secondary | ICD-10-CM | POA: Diagnosis not present

## 2018-09-22 DIAGNOSIS — Z8249 Family history of ischemic heart disease and other diseases of the circulatory system: Secondary | ICD-10-CM | POA: Diagnosis not present

## 2018-09-22 DIAGNOSIS — N183 Chronic kidney disease, stage 3 (moderate): Secondary | ICD-10-CM | POA: Diagnosis not present

## 2018-09-22 DIAGNOSIS — F17210 Nicotine dependence, cigarettes, uncomplicated: Secondary | ICD-10-CM | POA: Diagnosis not present

## 2018-09-22 DIAGNOSIS — C9000 Multiple myeloma not having achieved remission: Secondary | ICD-10-CM

## 2018-09-22 DIAGNOSIS — Z7901 Long term (current) use of anticoagulants: Secondary | ICD-10-CM | POA: Diagnosis not present

## 2018-09-22 DIAGNOSIS — G8929 Other chronic pain: Secondary | ICD-10-CM | POA: Diagnosis not present

## 2018-09-22 DIAGNOSIS — C9001 Multiple myeloma in remission: Secondary | ICD-10-CM | POA: Diagnosis present

## 2018-09-22 DIAGNOSIS — E1122 Type 2 diabetes mellitus with diabetic chronic kidney disease: Secondary | ICD-10-CM | POA: Diagnosis not present

## 2018-09-22 DIAGNOSIS — Z7289 Other problems related to lifestyle: Secondary | ICD-10-CM | POA: Diagnosis not present

## 2018-09-22 DIAGNOSIS — I129 Hypertensive chronic kidney disease with stage 1 through stage 4 chronic kidney disease, or unspecified chronic kidney disease: Secondary | ICD-10-CM | POA: Diagnosis not present

## 2018-09-22 DIAGNOSIS — R197 Diarrhea, unspecified: Secondary | ICD-10-CM | POA: Diagnosis not present

## 2018-09-22 DIAGNOSIS — R8290 Unspecified abnormal findings in urine: Secondary | ICD-10-CM | POA: Diagnosis not present

## 2018-09-22 DIAGNOSIS — Z803 Family history of malignant neoplasm of breast: Secondary | ICD-10-CM | POA: Diagnosis not present

## 2018-09-22 DIAGNOSIS — Z950 Presence of cardiac pacemaker: Secondary | ICD-10-CM | POA: Diagnosis not present

## 2018-09-22 DIAGNOSIS — Z7902 Long term (current) use of antithrombotics/antiplatelets: Secondary | ICD-10-CM | POA: Diagnosis not present

## 2018-09-22 DIAGNOSIS — R7989 Other specified abnormal findings of blood chemistry: Secondary | ICD-10-CM | POA: Diagnosis not present

## 2018-09-22 DIAGNOSIS — Z86010 Personal history of colonic polyps: Secondary | ICD-10-CM | POA: Diagnosis not present

## 2018-09-22 DIAGNOSIS — Z9049 Acquired absence of other specified parts of digestive tract: Secondary | ICD-10-CM | POA: Diagnosis not present

## 2018-09-22 DIAGNOSIS — Z923 Personal history of irradiation: Secondary | ICD-10-CM | POA: Diagnosis not present

## 2018-09-22 DIAGNOSIS — Z8546 Personal history of malignant neoplasm of prostate: Secondary | ICD-10-CM | POA: Diagnosis not present

## 2018-09-22 LAB — CBC WITH DIFFERENTIAL/PLATELET
Abs Immature Granulocytes: 0.02 10*3/uL (ref 0.00–0.07)
Basophils Absolute: 0 10*3/uL (ref 0.0–0.1)
Basophils Relative: 1 %
Eosinophils Absolute: 0.4 10*3/uL (ref 0.0–0.5)
Eosinophils Relative: 4 %
HCT: 41.4 % (ref 39.0–52.0)
Hemoglobin: 12.9 g/dL — ABNORMAL LOW (ref 13.0–17.0)
Immature Granulocytes: 0 %
Lymphocytes Relative: 15 %
Lymphs Abs: 1.3 10*3/uL (ref 0.7–4.0)
MCH: 25.4 pg — ABNORMAL LOW (ref 26.0–34.0)
MCHC: 31.2 g/dL (ref 30.0–36.0)
MCV: 81.5 fL (ref 80.0–100.0)
Monocytes Absolute: 0.7 10*3/uL (ref 0.1–1.0)
Monocytes Relative: 8 %
Neutro Abs: 6 10*3/uL (ref 1.7–7.7)
Neutrophils Relative %: 72 %
Platelets: 260 10*3/uL (ref 150–400)
RBC: 5.08 MIL/uL (ref 4.22–5.81)
RDW: 21 % — ABNORMAL HIGH (ref 11.5–15.5)
WBC: 8.3 10*3/uL (ref 4.0–10.5)
nRBC: 0 % (ref 0.0–0.2)

## 2018-09-22 LAB — COMPREHENSIVE METABOLIC PANEL
ALT: 12 U/L (ref 0–44)
AST: 15 U/L (ref 15–41)
Albumin: 3.7 g/dL (ref 3.5–5.0)
Alkaline Phosphatase: 67 U/L (ref 38–126)
Anion gap: 6 (ref 5–15)
BUN: 17 mg/dL (ref 8–23)
CO2: 24 mmol/L (ref 22–32)
Calcium: 8.8 mg/dL — ABNORMAL LOW (ref 8.9–10.3)
Chloride: 109 mmol/L (ref 98–111)
Creatinine, Ser: 1.53 mg/dL — ABNORMAL HIGH (ref 0.61–1.24)
GFR calc Af Amer: 50 mL/min — ABNORMAL LOW (ref >60)
GFR calc non Af Amer: 44 mL/min — ABNORMAL LOW (ref >60)
Glucose, Bld: 100 mg/dL — ABNORMAL HIGH (ref 70–99)
Potassium: 4.3 mmol/L (ref 3.5–5.1)
Sodium: 139 mmol/L (ref 135–145)
Total Bilirubin: 0.4 mg/dL (ref 0.3–1.2)
Total Protein: 8.6 g/dL — ABNORMAL HIGH (ref 6.5–8.1)

## 2018-09-22 LAB — PSA: Prostatic Specific Antigen: 0.08 ng/mL (ref 0.00–4.00)

## 2018-09-25 LAB — KAPPA/LAMBDA LIGHT CHAINS
Kappa free light chain: 133.8 mg/L — ABNORMAL HIGH (ref 3.3–19.4)
Kappa, lambda light chain ratio: 4.45 — ABNORMAL HIGH (ref 0.26–1.65)
Lambda free light chains: 30.1 mg/L — ABNORMAL HIGH (ref 5.7–26.3)

## 2018-09-25 LAB — MULTIPLE MYELOMA PANEL, SERUM
Albumin SerPl Elph-Mcnc: 3.2 g/dL (ref 2.9–4.4)
Albumin/Glob SerPl: 0.8 (ref 0.7–1.7)
Alpha 1: 0.3 g/dL (ref 0.0–0.4)
Alpha2 Glob SerPl Elph-Mcnc: 0.8 g/dL (ref 0.4–1.0)
B-Globulin SerPl Elph-Mcnc: 1.1 g/dL (ref 0.7–1.3)
Gamma Glob SerPl Elph-Mcnc: 2 g/dL — ABNORMAL HIGH (ref 0.4–1.8)
Globulin, Total: 4.3 g/dL — ABNORMAL HIGH (ref 2.2–3.9)
IgA: 264 mg/dL (ref 61–437)
IgG (Immunoglobin G), Serum: 2233 mg/dL — ABNORMAL HIGH (ref 603–1613)
IgM (Immunoglobulin M), Srm: 102 mg/dL (ref 15–143)
Total Protein ELP: 7.5 g/dL (ref 6.0–8.5)

## 2018-09-28 ENCOUNTER — Other Ambulatory Visit: Payer: Self-pay

## 2018-09-29 ENCOUNTER — Inpatient Hospital Stay (HOSPITAL_BASED_OUTPATIENT_CLINIC_OR_DEPARTMENT_OTHER): Payer: Medicare Other | Admitting: Oncology

## 2018-09-29 ENCOUNTER — Inpatient Hospital Stay: Payer: Medicare Other

## 2018-09-29 ENCOUNTER — Other Ambulatory Visit: Payer: Self-pay

## 2018-09-29 ENCOUNTER — Encounter: Payer: Self-pay | Admitting: Oncology

## 2018-09-29 VITALS — BP 123/79 | HR 77 | Temp 96.6°F | Resp 18 | Wt 186.3 lb

## 2018-09-29 DIAGNOSIS — Z79899 Other long term (current) drug therapy: Secondary | ICD-10-CM

## 2018-09-29 DIAGNOSIS — Z808 Family history of malignant neoplasm of other organs or systems: Secondary | ICD-10-CM

## 2018-09-29 DIAGNOSIS — N183 Chronic kidney disease, stage 3 (moderate): Secondary | ICD-10-CM | POA: Diagnosis not present

## 2018-09-29 DIAGNOSIS — Z8546 Personal history of malignant neoplasm of prostate: Secondary | ICD-10-CM

## 2018-09-29 DIAGNOSIS — Z833 Family history of diabetes mellitus: Secondary | ICD-10-CM

## 2018-09-29 DIAGNOSIS — R195 Other fecal abnormalities: Secondary | ICD-10-CM

## 2018-09-29 DIAGNOSIS — I129 Hypertensive chronic kidney disease with stage 1 through stage 4 chronic kidney disease, or unspecified chronic kidney disease: Secondary | ICD-10-CM | POA: Diagnosis not present

## 2018-09-29 DIAGNOSIS — E1122 Type 2 diabetes mellitus with diabetic chronic kidney disease: Secondary | ICD-10-CM

## 2018-09-29 DIAGNOSIS — Z923 Personal history of irradiation: Secondary | ICD-10-CM

## 2018-09-29 DIAGNOSIS — Z7902 Long term (current) use of antithrombotics/antiplatelets: Secondary | ICD-10-CM

## 2018-09-29 DIAGNOSIS — F17210 Nicotine dependence, cigarettes, uncomplicated: Secondary | ICD-10-CM

## 2018-09-29 DIAGNOSIS — G8929 Other chronic pain: Secondary | ICD-10-CM

## 2018-09-29 DIAGNOSIS — Z800 Family history of malignant neoplasm of digestive organs: Secondary | ICD-10-CM

## 2018-09-29 DIAGNOSIS — K922 Gastrointestinal hemorrhage, unspecified: Secondary | ICD-10-CM

## 2018-09-29 DIAGNOSIS — Z7289 Other problems related to lifestyle: Secondary | ICD-10-CM

## 2018-09-29 DIAGNOSIS — M069 Rheumatoid arthritis, unspecified: Secondary | ICD-10-CM

## 2018-09-29 DIAGNOSIS — Z8249 Family history of ischemic heart disease and other diseases of the circulatory system: Secondary | ICD-10-CM

## 2018-09-29 DIAGNOSIS — D500 Iron deficiency anemia secondary to blood loss (chronic): Secondary | ICD-10-CM

## 2018-09-29 DIAGNOSIS — R197 Diarrhea, unspecified: Secondary | ICD-10-CM

## 2018-09-29 DIAGNOSIS — Z86010 Personal history of colonic polyps: Secondary | ICD-10-CM

## 2018-09-29 DIAGNOSIS — I700 Atherosclerosis of aorta: Secondary | ICD-10-CM

## 2018-09-29 DIAGNOSIS — C9001 Multiple myeloma in remission: Secondary | ICD-10-CM

## 2018-09-29 DIAGNOSIS — R7989 Other specified abnormal findings of blood chemistry: Secondary | ICD-10-CM

## 2018-09-29 DIAGNOSIS — Z9049 Acquired absence of other specified parts of digestive tract: Secondary | ICD-10-CM

## 2018-09-29 DIAGNOSIS — C9000 Multiple myeloma not having achieved remission: Secondary | ICD-10-CM

## 2018-09-29 DIAGNOSIS — R8290 Unspecified abnormal findings in urine: Secondary | ICD-10-CM

## 2018-09-29 DIAGNOSIS — Z7901 Long term (current) use of anticoagulants: Secondary | ICD-10-CM

## 2018-09-29 DIAGNOSIS — Z803 Family history of malignant neoplasm of breast: Secondary | ICD-10-CM

## 2018-09-29 DIAGNOSIS — Z950 Presence of cardiac pacemaker: Secondary | ICD-10-CM

## 2018-09-29 LAB — BASIC METABOLIC PANEL
Anion gap: 6 (ref 5–15)
BUN: 18 mg/dL (ref 8–23)
CO2: 22 mmol/L (ref 22–32)
Calcium: 8.9 mg/dL (ref 8.9–10.3)
Chloride: 107 mmol/L (ref 98–111)
Creatinine, Ser: 1.24 mg/dL (ref 0.61–1.24)
GFR calc Af Amer: >60 mL/min (ref >60)
GFR calc non Af Amer: 56 mL/min — ABNORMAL LOW (ref >60)
Glucose, Bld: 90 mg/dL (ref 70–99)
Potassium: 4.2 mmol/L (ref 3.5–5.1)
Sodium: 135 mmol/L (ref 135–145)

## 2018-09-29 NOTE — Progress Notes (Signed)
Patient here for follow up. States he has diarrhea sometimes. No diarrhea at the moment.

## 2018-09-30 NOTE — Progress Notes (Signed)
Hematology/Oncology Follow Up Note [facility] [phone] [phone]  Patient Care Team: Jodi Marble, MD as PCP - General (Internal Medicine) Bary Castilla, Forest Gleason, MD (General Surgery) Edrick Kins, MD as Rounding Team (Internal Medicine) Hillary Bow, MD as Consulting Physician (Internal Medicine) [name], MD as Surgeon (Gastroenterology) Isaias Cowman, MD as Consulting Physician (Cardiology) Earlie Server, MD as Consulting Physician (Oncology)  REFERRING PROVIDER: Dr.Lateef, [place]  REASON FOR VISIT Follow up for management of plasma cell dyscrasia.   HISTORY OF PRESENTING ILLNESS:  78 y.o.  male with PMH listed below who presents to follow up on the evaluation and management of his abnormal urine protein electrophoresis results. I reviewed the records from [facility], [place] and [place] was performed and HemOnc related medical problems are listed below.  He lives with a friend. He has 3 adult children.   1 Chronic Kidney disease, Stage III: patient follows up with Dr.Lateef.  2 Proteinuria:  02/18/2017: Albumin/Creatinine ratio was 7, Urine protein electrophoresis,Random Urine revealed M Spike 43.2%, total protein of 43.86m/dl,  01/05/2017 Albumin/Creatinine ratio was 455.2, Albumin 996.5, , 2.15.2018 Albumin/Creatinine ratio was 80.3, Albumin 73.3, Urine protein electrophoresis,Random Urine revealed M Spike 29.7%, total protein of 29.174mdl, 06/30/2016 Serum protein electrophoresis did not detect M spike.  Autoimmune disorder work up showed Negative anti dsDNA, RNP antibodies, smith antibody, Sjogren antibodies, anti Jo-1, positive antichromotin antibodies, positive ANA,  3 Anemia of chronic kidney disease:  4 Iron deficiency anemia: history of iron deficiency, ferritin was 7 on 08/11/2016. He had EGD and colonoscopy that were done this year which showed  duodenitis/esphagitis/diverticulosis/benigh polyps. 5 Rheumatoid arthritis: he follows up with Dr.Kernodle and is on chronic MTX. He Is currently off MTX reports joint pains are controlled, not quite symptomatic.   Patient denies any persistent bone pain or any pain. He continue to feel lack of energy, persistent, not improved with resting or taking naps. He also has lost 25 pounds in the past year which was unintentional.   # Rheumatoid arthritis and currently is off MTX.  # It is ambiguous whether he has truly symptomatic multiple myeloma or a smoldering myeloma, given that anemia can be secondary to iron deficiency and CKD.  The hypermetabolic 2.1 cm hypermetabolic soft tissue lesion in the left heel, can reflect a Plasmacytoma,which should have been biopsied. However patient initially denies being on any blood thinner, later he was found to be on Plavix with pre biopsy questionnaire screening, and biopsy was held. Patient has to contact cardiologist to hold plavix for seven days before procedure. He feels frustrated about multiple workup and meanwhile he becomes more fatigues, with worsening of kidney function. Patient was reluctant about proceeding additional invasive procedures and wants to start treatment. Decision was made to start active MM treatment.   # [facility] bone marrow [facility]. He is not considered to  good candidate for transplant. Patient cardiologist Dr. KaChancy Milroyad started patient on Lasix 20 mg daily. [facility] as patient was having dizziness and borderline blood pressure during his clinic visit there.  # small bowel AVMs was ablated at [facility]  Current Treatment:  S/p RVD x 4 cycles tolerates well except cytopenia.  Revlimid 10 mg PO once per day on days 1 to 14 (dose reduced due to GFR) Revlimid renal dosing of 1022m Velcade 1.3 mg/m2 IV once per day on  days 1,  8, 15  Dexamethasone 20 mg PO once per day on days 1, 8, 15 given patient's diabetes Given patient's multiple comorbidity and the side effects of cytopenia during his RVD treatment and the uncertainty of this is really a site of plasmacytoma or not, I recommend not to continue him on maintenance Revlimid.   INTERVAL HISTORY Patient presents for follow-up of management of multiple myeloma/smoldering multiple myeloma. He is not on maintenance treatment. Reports feeling well except that he noticed recently he has been having intermittent diarrhea episodes.  No exacerbation or alleviation factors.  Denies any fatigue, weight loss, bone pain, chills, night sweats. Appetite is fair. Chronic shortness of breath with exertion, stable. Patient has chronic joint pain due to rheumatoid arthritis.  On Plaquenil.  Review of Systems  Constitutional: Negative for appetite change, chills, fatigue, fever and unexpected weight change.  HENT:   Negative for hearing loss and voice change.   Eyes: Negative for eye problems and icterus.  Respiratory: Negative for chest tightness, cough and shortness of breath.   Cardiovascular: Negative for chest pain and leg swelling.  Gastrointestinal: Positive for diarrhea. Negative for abdominal distention and abdominal pain.  Endocrine: Negative for hot flashes.  Genitourinary: Negative for difficulty urinating, dysuria and frequency.   Musculoskeletal: Negative for arthralgias.  Skin: Negative for itching and rash.  Neurological: Negative for light-headedness and numbness.  Hematological: Negative for adenopathy. Does not bruise/bleed easily.  Psychiatric/Behavioral: Negative for confusion.     MEDICAL HISTORY:  Past Medical History:  Diagnosis Date  . Anxiety   . Arthritis    rheumatoid  . Atrial fibrillation (HCC)    hx of  . Cancer ([place])   . Chronic kidney disease   . Colon polyp 07-07-15   TUBULAR ADENOMA WITH AT LEAST HIGH-GRADE / Dr Rayann Heman  . Diabetes mellitus  without complication ([place])   . Dysrhythmia    bradycardia...Marland KitchenMarland Kitchen2 to 1 heart block  . GERD (gastroesophageal reflux disease)   . Heart murmur    patient unaware of history of murmur  . Hypercholesterolemia   . Hypertension   . Presence of permanent cardiac pacemaker   . Prostate cancer ([place]) 01/01/13, 01/30/14   Gleason 3+4=7, volume 46.6 cc  . Rheumatoid arthritis ([place])   . S/P radiation therapy  04/03/2014 through 06/04/2014                                                      Prostate 7800 cGy in 40 sessions                          . Sleep apnea    uses cpap but it is not in the hospital with him    SURGICAL HISTORY: Past Surgical History:  Procedure Laterality Date  . ABDOMINAL AORTIC ANEURYSM REPAIR  11/2013  . COLON SURGERY  March 2017   Right hemicolectomy for tubulovillous adenoma with high-grade dysplasia.  . COLONOSCOPY WITH PROPOFOL N/A 07/07/2015   Procedure: COLONOSCOPY WITH PROPOFOL;  Surgeon: Josefine Class, MD;  Location[facility] ENDOSCOPY;  Service: Endoscopy;  Laterality: N/A;  . COLONOSCOPY WITH PROPOFOL N/A 10/28/2016   Procedure: COLONOSCOPY WITH PROPOFOL;  Surgeon: [name], MD;  Location: [facility] ENDOSCOPY;  Service: Endoscopy;  Laterality: N/A;  . ESOPHAGOGASTRODUODENOSCOPY (EGD) WITH PROPOFOL N/A 10/28/2016  Procedure: ESOPHAGOGASTRODUODENOSCOPY (EGD) WITH PROPOFOL;  Surgeon: [name], MD;  Location[facility] ENDOSCOPY;  Service: Endoscopy;  Laterality: N/A;  . GIVENS CAPSULE STUDY N/A 07/25/2017   Procedure: GIVENS CAPSULE STUDY;  Surgeon: [name], MD;  Location: [facility] ENDOSCOPY;  Service: Gastroenterology;  Laterality: N/A;  . LAPAROSCOPIC RIGHT COLECTOMY Right 08/08/2015   Procedure: LAPAROSCOPIC RIGHT COLECTOMY;  Surgeon: Robert Bellow, MD;  Location: [facility] ORS;  Service: General;  Laterality: Right;  . NASAL SINUS SURGERY    . PACEMAKER INSERTION Left 08/26/2016   Procedure: INSERTION PACEMAKER;  Surgeon: Isaias Cowman, MD;  Location: [facility] ORS;   Service: Cardiovascular;  Laterality: Left;  . PROSTATE BIOPSY  01/01/13, 01/30/14   Gleason 3+3=6, vol 46.6 cc  . TONSILLECTOMY    . uvula surgery     for sleep apnea    SOCIAL HISTORY: Social History   Socioeconomic History  . Marital status: Divorced    Spouse name: Not on file  . Number of children: Not on file  . Years of education: Not on file  . Highest education level: Not on file  Occupational History  . Not on file  Social Needs  . Financial resource strain: Not on file  . Food insecurity:    Worry: Not on file    Inability: Not on file  . Transportation needs:    Medical: Not on file    Non-medical: Not on file  Tobacco Use  . Smoking status: Current Every Day Smoker    Packs/day: 0.50    Years: 50.00    Pack years: 25.00    Types: Cigarettes  . Smokeless tobacco: Never Used  . Tobacco comment: DOWN TO 1/2 PPD  Substance and Sexual Activity  . Alcohol use: Yes    Alcohol/week: 1.0 standard drinks    Types: 1 Cans of beer per week    Comment: occasionally  . Drug use: No  . Sexual activity: Not Currently  Lifestyle  . Physical activity:    Days per week: Not on file    Minutes per session: Not on file  . Stress: Not on file  Relationships  . Social connections:    Talks on phone: Not on file    Gets together: Not on file    Attends religious service: Not on file    Active member of club or organization: Not on file    Attends meetings of clubs or organizations: Not on file    Relationship status: Not on file  . Intimate partner violence:    Fear of current or ex partner: Not on file    Emotionally abused: Not on file    Physically abused: Not on file    Forced sexual activity: Not on file  Other Topics Concern  . Not on file  Social History Narrative  . Not on file    FAMILY HISTORY: Family History  Problem Relation Age of Onset  . Heart attack Mother   . Cirrhosis Father   . Pancreatic cancer Brother   . Diabetes Brother   . Diabetes  Daughter        medication induced for cancer treatments  . Cancer Daughter        breast, brain    ALLERGIES:  has No Known Allergies.  MEDICATIONS:  Current Outpatient Medications  Medication Sig Dispense Refill  . acetaminophen (TYLENOL) 325 MG tablet Take 325 mg by mouth every 6 (six) hours as needed.    Marland Kitchen albuterol (PROVENTIL HFA;VENTOLIN HFA) 108 (90  Base) MCG/ACT inhaler     . ALPRAZolam (XANAX) 0.25 MG tablet Take by mouth 2 (two) times daily as needed.     Marland Kitchen amLODipine-benazepril (LOTREL) 5-20 MG capsule Take 1 capsule by mouth daily.    Marland Kitchen atorvastatin (LIPITOR) 80 MG tablet Take 80 mg by mouth at bedtime.     . benazepril (LOTENSIN) 20 MG tablet Take 20 mg by mouth daily.    . benzonatate (TESSALON) 100 MG capsule Take 100 mg by mouth 3 (three) times daily.    . Butalbital-APAP-Caffeine (FIORICET) 50-300-40 MG CAPS Take 1 capsule every four to six hours as needed    . Cholecalciferol (D3-50) 50000 units capsule Take 50,000 Units by mouth once a week.     . colchicine 0.6 MG tablet     . ELIQUIS 2.5 MG TABS tablet     . fexofenadine (ALLEGRA) 180 MG tablet Take 180 mg by mouth daily.    . fluticasone (FLONASE) 50 MCG/ACT nasal spray Place 2 sprays into both nostrils daily as needed for rhinitis.     . Fluticasone Furoate-Vilanterol (BREO ELLIPTA) 100-25 MCG/INH AEPB Inhale 1 puff into the lungs daily.     Marland Kitchen gabapentin (NEURONTIN) 400 MG capsule Take 400 mg by mouth 3 (three) times daily.    Marland Kitchen glucose blood (ONE TOUCH ULTRA TEST) test strip     . hydroxychloroquine (PLAQUENIL) 200 MG tablet Take 400 mg by mouth daily.  2  . isosorbide mononitrate (IMDUR) 30 MG 24 hr tablet Take 30 mg by mouth daily.    Marland Kitchen loperamide (IMODIUM) 2 MG capsule Take 1 capsule (2 mg total) by mouth See admin instructions. With onset of diarrhea, take 68m followed by 274mevery 2 hours resolved. Maximum: 16 mg/day 120 capsule 0  . metFORMIN (GLUCOPHAGE) 500 MG tablet Take 500 mg by mouth daily with  breakfast.     . metoprolol succinate (TOPROL-XL) 25 MG 24 hr tablet Take 25 mg by mouth daily.    . ondansetron (ZOFRAN) 8 MG tablet Take 1 tablet (8 mg total) 2 (two) times daily as needed by mouth (Nausea or vomiting). 30 tablet 1  . pantoprazole (PROTONIX) 20 MG tablet Take 20 mg by mouth daily.    . Marland Kitchenriamcinolone cream (KENALOG) 0.1 % Apply 1 application topically 2 (two) times daily.    . Marland Kitchenspirin 81 MG chewable tablet Chew 81 mg by mouth daily.    . clopidogrel (PLAVIX) 75 MG tablet Take by mouth.    . folic acid (FOLVITE) 1 MG tablet Take by mouth.    . furosemide (LASIX) 20 MG tablet Take 20 mg by mouth daily.    . Marland Kitchenabapentin (NEURONTIN) 300 MG capsule Take 300 mg by mouth 3 (three) times daily.  2  . pantoprazole (PROTONIX) 40 MG tablet Take 1 tablet (40 mg total) by mouth 2 (two) times daily. (Patient not taking: Reported on 09/29/2018) 60 tablet 3  . traMADol-acetaminophen (ULTRACET) 37.5-325 MG tablet      No current facility-administered medications for this visit.       . Marland KitchenPHYSICAL EXAMINATION: ECOG PERFORMANCE STATUS: 1 - Symptomatic but completely ambulatory Vitals:   09/29/18 1020  BP: 123/79  Pulse: 77  Resp: 18  Temp: (!) 96.6 F (35.9 C)   Filed Weights   09/29/18 1020  Weight: 186 lb 4.8 oz (84.5 kg)   Physical Exam  Constitutional: He is oriented to person, place, and time and well-developed, well-nourished, and in no distress. No distress.  HENT:  Head: Normocephalic and atraumatic.  Nose: Nose normal.  Mouth/Throat: Oropharynx is clear and moist. No oropharyngeal exudate.  Eyes: Pupils are equal, round, and reactive to light. EOM are normal. Right eye exhibits no discharge. Left eye exhibits no discharge. No scleral icterus.  Neck: Normal range of motion. Neck supple. No JVD present.  Cardiovascular: Normal rate, regular rhythm and normal heart sounds. Exam reveals no friction rub.  No murmur heard. Pulmonary/Chest: Effort normal and breath sounds  normal. No respiratory distress. He has no wheezes. He has no rales. He exhibits no tenderness.  Abdominal: Soft. Bowel sounds are normal. He exhibits no distension and no mass. There is no abdominal tenderness. There is no rebound.  Musculoskeletal: Normal range of motion.        General: No tenderness or edema.  Lymphadenopathy:    He has no cervical adenopathy.  Neurological: He is alert and oriented to person, place, and time. No cranial nerve deficit. He exhibits normal muscle tone. Coordination normal.  Skin: Skin is warm and dry. No rash noted. He is not diaphoretic. No erythema.  Psychiatric: Affect and judgment normal.    LABORATORY DATA:  I have reviewed the data as listed Lab Results  Component Value Date   WBC 8.3 09/22/2018   HGB 12.9 (L) 09/22/2018   HCT 41.4 09/22/2018   MCV 81.5 09/22/2018   PLT 260 09/22/2018   Recent Labs    02/13/18 1147 05/15/18 0925 08/29/18 1149 09/22/18 1108 09/29/18 1133  NA 138 140 137 139 135  K 4.5 4.3 4.1 4.3 4.2  CL 105 108 106 109 107  CO2 _0 GLUCOSE 98 95 105* 100* 90  BUN _1 CREATININE 1.24 1.14 1.15 1.53* 1.24  CALCIUM 9.2 9.0 9.1 8.8* 8.9  GFRNONAA 55* >60 >60 44* 56*  GFRAA >60 >60 >60 50* >60  PROT 8.3* 8.1  --  8.6*  --   ALBUMIN 3.6 3.8  --  3.7  --   AST 15 18  --  15  --   ALT 11 16  --  12  --   ALKPHOS 66 79  --  67  --   BILITOT 0.6 0.5  --  0.4  --     SPEP: no M spike Serum free light chain Kappa/Lamda ratio 13.04 UPEP  Free light chain Kappa/lamda ratio 162.43, M spike 114m/24 hour   Bone marrow biopsy 03/21/2017  Bone Marrow, Aspirate,Biopsy, and Clot, left iliac - HYPERCELLULAR BONE MARROW FOR AGE WITH TRILINEAGE HEMATOPOIESIS. - PLASMACYTOSIS (PLASMA CELLS 8%)  Karyotype: loss of Y chromosome Cytogenetic MDS FISH Panel negative.   Bone marrow 04/06/2017  Bone Marrow, Aspirate,Biopsy, and Clot, right iliac and core - HYPERCELLULAR BONE MARROW FOR AGE WITH PLASMA  CELL NEOPLASM - TRILINEAGE HEMATOPOIESIS. - SEE COMMENT. PERIPHERAL BLOOD: - MICROCYTIC-HYPOCHROMIC ANEMIA. Diagnosis Note The bone marrow is hypercellular with trilineage hematopoiesis but with relative abundance of erythroid precursors and increased number of megakaryocytes with nonspecific changes. Significant dyspoiesis is not seen. Iron stores are present with no ring sideroblasts. The plasma cells are increased in number representing 8% of all cells in the aspirate although focal areas in the core biopsy show 10 to 20% as primarily seen by CD138 stain. In situ hybridization for kappa and lambda light chains show kappa light chain restriction consistent with plasma cell neoplasm. Correlation with cytogenetic and FISH studies is recommended. (BNS:ecj/gt 04/07/2017)  IMAGE STUDIES I have personally reviewed below  image results.  03/15/2017 DG bone survey Met: Nonspecific lucent lesion in the distal right ulna measuring 15-16 mm, but otherwise normal bone mineralization for age throughout the visible skeleton. A solitary lytic lesion in a distal extremity would be an unusual presentation of multiple myeloma, and I favor the distal right ulna lesion is benign. Recommend correlation with serum and urine protein electrophoresis.  04/14/2017 PET scan: 1. 2.1 cm hypermetabolic soft tissue lesion in the left heel, adjacent to the calcaneal tuberosity. 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 2. Mottled FDG accumulation diffusely in the marrow space without other frankly overt hypermetabolic bony lesion. 3. Coronary artery and thoracoabdominal aortic atherosclerosis with abdominal aortic stent graft visualized in situ. 4. Bilateral renal cysts and bilateral nonobstructing renal stones.  08/24/2017 PET scan 1. No hypermetabolic osseous lesions. No definite signs of multiple myeloma on CT imaging. 2. Interval improvement in soft tissue activity along the medial aspect of the left calcaneal  tuberosity, likely plantar fasciitis. 3. New small left pleural effusion. 4. Bilateral inguinal hernias containing small bowel on the right and sigmoid colon on the left. No evidence of incarceration or obstruction. 5. Otherwise stable incidental findings including diffuse atherosclerosis, bilateral renal cysts, emphysema and sigmoid diverticulosis.  ASSESSMENT & PLAN:  1. Multiple myeloma in remission ([place])   2. Loose stools   3. Elevated serum creatinine   4. Diarrhea, unspecified type   5. Iron deficiency anemia due to chronic blood loss    #Smoldering multiple myeloma or active myeloma if hypermetabolic lesion on PET scan is related to myeloma. He was treated with 4 cycles of RVD. Not on maintenance treatment due to multiple comorbidities and borderline smoldering multiple myeloma/myeloma disease status. Labs are reviewed and discussed with patient.  Multiple myeloma panel showed no M spike, free light chain ratio stable. Clinically he has no new bone pain. Hemoglobin has been stable. Recommend to repeat left foot/ankle x-ray to follow-up on his left heel lesion which was on initial PET scan and not detectable on the follow-up PET scan.  If any suspicious findings, will proceed with a repeat PET scan.  #Chronic renal insufficiency, stage III, kidney function worsened with elevated creatinine of 1.53. Denies any recent NSAID use.  Questionable due to dehydration secondary to intermittent diarrhea.  repeat BMP today.  #Intermittent diarrhea, etiology unknown.  Check C. Difficile #Anemia, hemoglobin 12.9, which has decreased to 1 g since March 2020. Patient has a history of iron deficiency anemia due to GI bleeding, previously treated with IV iron. Will repeat iron panel at the next visit.  #09/30/2018, repeat BMP showed kidney function back to baseline.  Called patient and reviewed the lab results with him. Follow up in 12 weeks months repeat cbc.,cmp, iron panel and MM labs.    Orders Placed This Encounter  Procedures  . C difficile quick screen w PCR reflex    Standing Status:   Future    Standing Expiration Date:   09/29/2019  . DG Ankle Complete Left    Standing Status:   Future    Standing Expiration Date:   11/30/2019    Order Specific Question:   Reason for Exam (SYMPTOM  OR DIAGNOSIS REQUIRED)    Answer:   history of left heel lesion on PET scan. follow up for myeloma status.    Order Specific Question:   Preferred imaging location?    Answer:   [facility]    Order Specific Question:   Radiology Contrast Protocol - do NOT remove  file path    Answer:   [email or web address]  . Basic metabolic panel    Standing Status:   Future    Number of Occurrences:   1    Standing Expiration Date:   09/29/2019    Earlie Server, MD, PhD Hematology [facility] Pager- [phone] 09/30/2018

## 2018-10-02 ENCOUNTER — Ambulatory Visit
Admission: RE | Admit: 2018-10-02 | Discharge: 2018-10-02 | Disposition: A | Discharge status: Home / Self Care | Payer: Medicare Other | Source: Ambulatory Visit | Attending: Oncology | Admitting: Oncology

## 2018-10-02 ENCOUNTER — Other Ambulatory Visit: Payer: Self-pay

## 2018-10-02 DIAGNOSIS — C9001 Multiple myeloma in remission: Secondary | ICD-10-CM | POA: Diagnosis present

## 2018-10-13 IMAGING — CT CT BIOPSY
1 of 2 series · 9 of 14 positions shown, 12 images · non-contrast
Comparison: none

INDICATION: Monoclonal gammopathy, plasmacytosis, concern for multiple myeloma

[Series 2: i-spiral 5.0 b30f · axial · 0.75mm/px · z∈[+843,+930]mm · 9 of 33 slices shown, 12 images]
[im 4/33  soft-tissue]
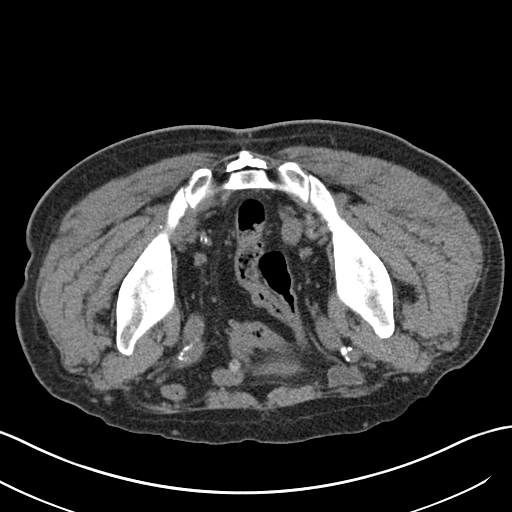
[im 4/33  bone]
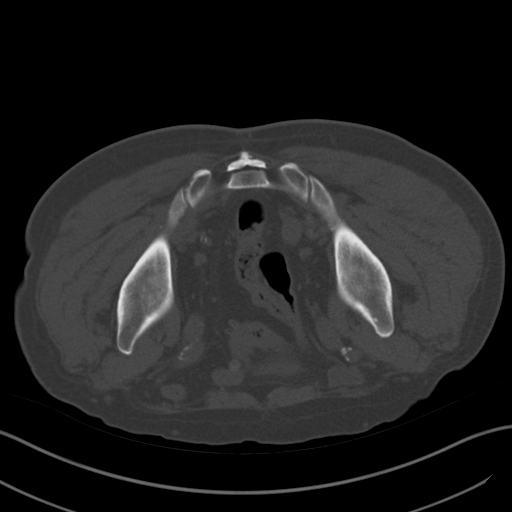
[im 7/33  bone]
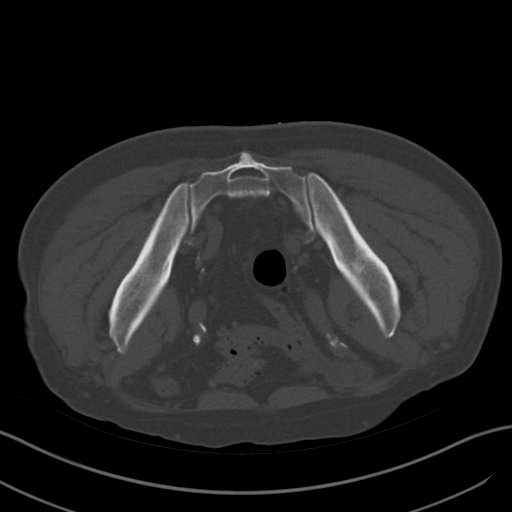
[im 10/33  bone]
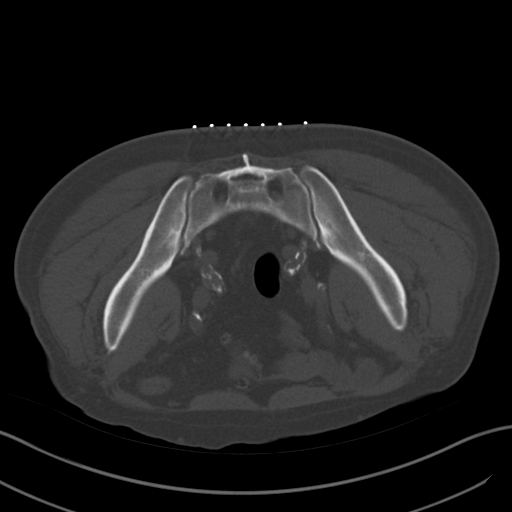
[im 13/33  bone]
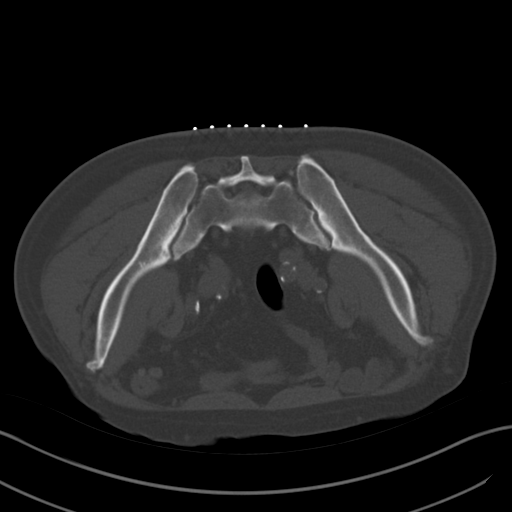
[im 17/33  soft-tissue]
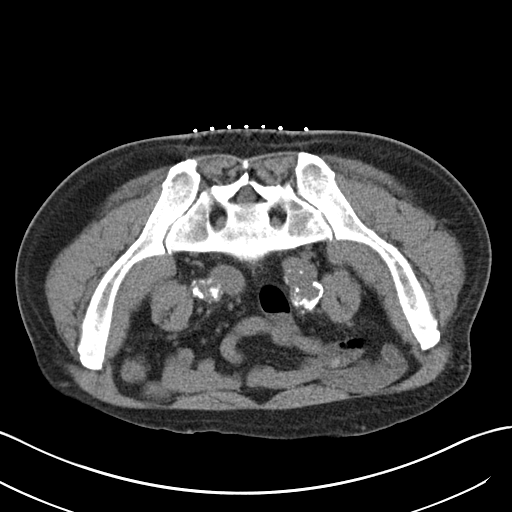
[im 17/33  bone]
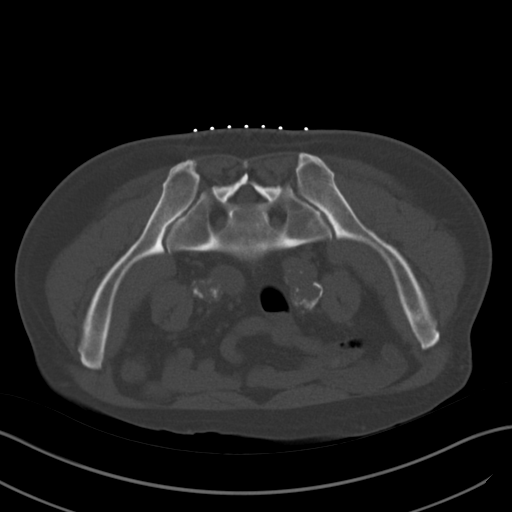
[im 20/33  bone]
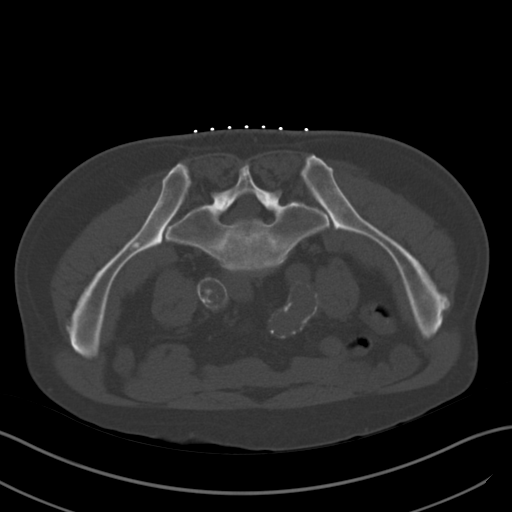
[im 23/33  bone]
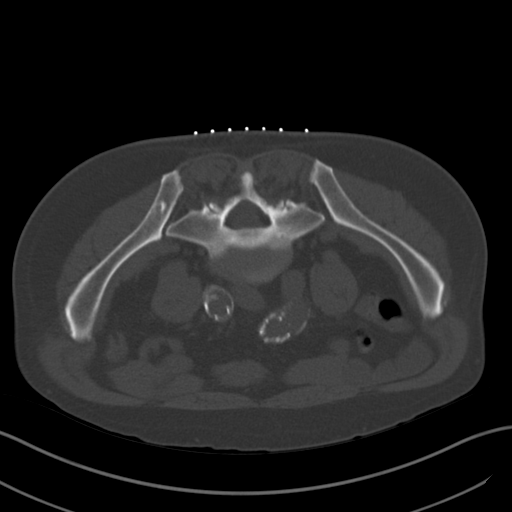
[im 26/33  bone]
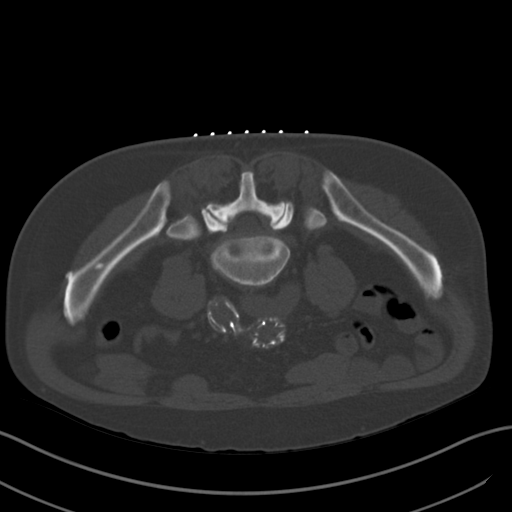
[im 29/33  soft-tissue]
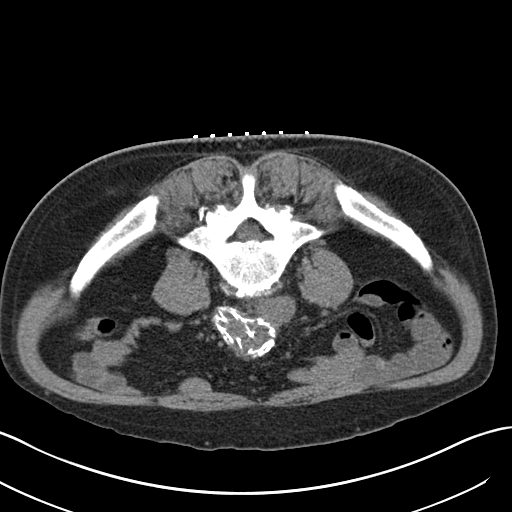
[im 29/33  bone]
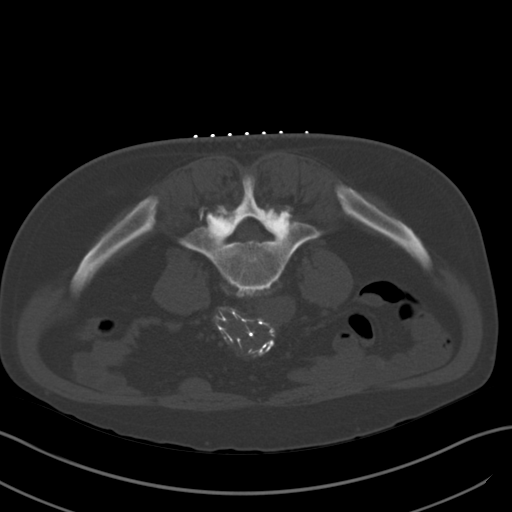

[9 of 14 positions shown; findings below may reference images not displayed]

EXAM:
CT GUIDED RIGHT ILIAC BONE MARROW ASPIRATION AND CORE BIOPSY

Radiologist:  Bekele, Evans

Guidance:  CT

FLUOROSCOPY TIME:  Fluoroscopy Time: NONE.

MEDICATIONS:
1% lidocaine local

ANESTHESIA/SEDATION:
4.0 mg IV Versed; 100 mcg IV Fentanyl

Moderate Sedation Time:  20 minutes

The patient was continuously monitored during the procedure by the
interventional radiology nurse under my direct supervision.

CONTRAST:  None.

COMPLICATIONS:
None

PROCEDURE:
Informed consent was obtained from the patient following explanation
of the procedure, risks, benefits and alternatives. The patient
understands, agrees and consents for the procedure. All questions
were addressed. A time out was performed.

The patient was positioned prone and non-contrast localization CT
was performed of the pelvis to demonstrate the iliac marrow spaces.

Maximal barrier sterile technique utilized including caps, mask,
sterile gowns, sterile gloves, large sterile drape, hand hygiene,
and Betadine prep.

Under sterile conditions and local anesthesia, an 11 gauge coaxial
bone biopsy needle was advanced into the right iliac marrow space.
Needle position was confirmed with CT imaging. Initially, bone
marrow aspiration was performed. Next, the 11 gauge outer cannula
was utilized to obtain a right iliac bone marrow core biopsy. Needle
was removed. Hemostasis was obtained with compression. The patient
tolerated the procedure well. Samples were prepared with the
cytotechnologist. No immediate complications.
IMPRESSION: CT guided right iliac bone marrow aspiration and core biopsy.

## 2018-10-23 ENCOUNTER — Ambulatory Visit: Payer: Medicare Other | Admitting: Podiatry

## 2018-10-26 ENCOUNTER — Other Ambulatory Visit: Payer: Self-pay

## 2018-10-26 ENCOUNTER — Ambulatory Visit: Payer: Medicare Other | Admitting: Podiatry

## 2018-10-26 ENCOUNTER — Encounter: Payer: Self-pay | Admitting: Podiatry

## 2018-10-26 VITALS — Temp 96.2°F

## 2018-10-26 DIAGNOSIS — I739 Peripheral vascular disease, unspecified: Secondary | ICD-10-CM

## 2018-10-26 DIAGNOSIS — D689 Coagulation defect, unspecified: Secondary | ICD-10-CM

## 2018-10-26 DIAGNOSIS — M79676 Pain in unspecified toe(s): Secondary | ICD-10-CM

## 2018-10-26 DIAGNOSIS — B351 Tinea unguium: Secondary | ICD-10-CM | POA: Diagnosis not present

## 2018-10-26 NOTE — Progress Notes (Signed)
Complaint:  Visit Type: Patient returns to my office for continued preventative foot care services. Complaint: Patient states" my nails have grown long and thick and become painful to walk and wear shoes" Patient has been diagnosed with DM with pvd and neuropathy.. The patient presents for preventative foot care services. No changes to ROS.    Podiatric Exam: Vascular: dorsalis pedis are palpable  B/L. and posterior tibial pulses are not  palpable bilateral. Capillary return is immediate. Temperature gradient is WNL. Skin turgor WNL  Sensorium: Diminished  Semmes Weinstein monofilament test.  Nail Exam: Pt has thick disfigured discolored nails with subungual debris noted bilateral entire nail hallux through fifth toenails.   Ulcer Exam: There is no evidence of ulcer or pre-ulcerative changes or infection. Orthopedic Exam: Muscle tone and strength are WNL. No limitations in general ROM. No crepitus or effusions noted. Foot type and digits show no abnormalities. Bony prominences are unremarkable. HAV  B/L and hammer toe  B/L.  Pes planus. Skin: No Porokeratosis. No infection or ulcers.  Clavi second toe right asymptomatic  Diagnosis:  Onychomycosis, , Pain in right toe, pain in left toes,    Treatment & Plan Procedures and Treatment: Consent by patient was obtained for treatment procedures. The patient understood the discussion of treatment and procedures well. All questions were answered thoroughly reviewed. Debridement of mycotic and hypertrophic toenails, 1 through 5 bilateral and clearing of subungual debris. No ulceration, no infection noted. Return Visit-Office Procedure: Patient instructed to return to the office for a follow up visit 3 months for continued evaluation and treatment.    Gardiner Barefoot DPM

## 2018-11-02 ENCOUNTER — Telehealth: Payer: Self-pay | Admitting: *Deleted

## 2018-11-02 DIAGNOSIS — Z87891 Personal history of nicotine dependence: Secondary | ICD-10-CM

## 2018-11-02 DIAGNOSIS — Z122 Encounter for screening for malignant neoplasm of respiratory organs: Secondary | ICD-10-CM

## 2018-11-02 NOTE — Telephone Encounter (Signed)
Received referral for initial lung cancer screening scan. Contacted patient and obtained smoking history,(current, 60 pack year) as well as answering questions related to screening process. Patient denies signs of lung cancer such as weight loss or hemoptysis. Patient denies comorbidity that would prevent curative treatment if lung cancer were found. Patient is scheduled for shared decision making visit and CT scan on 11/09/18 at 130pm.

## 2018-11-09 ENCOUNTER — Ambulatory Visit
Admission: RE | Admit: 2018-11-09 | Discharge: 2018-11-09 | Disposition: A | Discharge status: Home / Self Care | Payer: Medicare Other | Source: Ambulatory Visit | Attending: Nurse Practitioner | Admitting: Nurse Practitioner

## 2018-11-09 ENCOUNTER — Encounter: Payer: Self-pay | Admitting: *Deleted

## 2018-11-09 ENCOUNTER — Other Ambulatory Visit: Payer: Self-pay

## 2018-11-09 ENCOUNTER — Inpatient Hospital Stay: Payer: Medicare Other | Attending: Nurse Practitioner | Admitting: Hospice and Palliative Medicine

## 2018-11-09 DIAGNOSIS — Z122 Encounter for screening for malignant neoplasm of respiratory organs: Secondary | ICD-10-CM

## 2018-11-09 DIAGNOSIS — Z87891 Personal history of nicotine dependence: Secondary | ICD-10-CM | POA: Insufficient documentation

## 2018-11-09 NOTE — Progress Notes (Signed)
In accordance with CMS guidelines, patient has met eligibility criteria including age, absence of signs or symptoms of lung cancer.  Social History   Tobacco Use  . Smoking status: Current Every Day Smoker    Packs/day: 1.00    Years: 60.00    Pack years: 60.00    Types: Cigarettes  . Smokeless tobacco: Never Used  . Tobacco comment: DOWN TO 1/2 PPD  Substance Use Topics  . Alcohol use: Yes    Alcohol/week: 1.0 standard drinks    Types: 1 Cans of beer per week    Comment: occasionally  . Drug use: No      A shared decision-making session was conducted prior to the performance of CT scan. This includes one or more decision aids, includes benefits and harms of screening, follow-up diagnostic testing, over-diagnosis, false positive rate, and total radiation exposure.   Counseling on the importance of adherence to annual lung cancer LDCT screening, impact of co-morbidities, and ability or willingness to undergo diagnosis and treatment is imperative for compliance of the program.   Counseling on the importance of continued smoking cessation for former smokers; the importance of smoking cessation for current smokers, and information about tobacco cessation interventions have been given to patient including [facility] and [facility] quit [place] programs.   Written order for lung cancer screening with LDCT has been given to the patient and any and all questions have been answered to the best of my abilities.    Yearly follow up will be coordinated by Burgess Estelle, Thoracic Navigator.  Time Total: 15 minutes  Visit consisted of counseling and education dealing with complex health screening. Greater than 50%  of this time was spent counseling and coordinating care related to the above assessment and plan.  Signed by: Altha Harm, PhD, NP-C [phone] (Work Cell)

## 2018-11-15 ENCOUNTER — Encounter: Payer: Self-pay | Admitting: *Deleted

## 2018-12-18 ENCOUNTER — Other Ambulatory Visit: Payer: Self-pay

## 2018-12-18 ENCOUNTER — Inpatient Hospital Stay: Payer: Medicare Other | Attending: Oncology

## 2018-12-18 DIAGNOSIS — Z8249 Family history of ischemic heart disease and other diseases of the circulatory system: Secondary | ICD-10-CM | POA: Diagnosis not present

## 2018-12-18 DIAGNOSIS — Z833 Family history of diabetes mellitus: Secondary | ICD-10-CM | POA: Insufficient documentation

## 2018-12-18 DIAGNOSIS — D500 Iron deficiency anemia secondary to blood loss (chronic): Secondary | ICD-10-CM

## 2018-12-18 DIAGNOSIS — Z950 Presence of cardiac pacemaker: Secondary | ICD-10-CM | POA: Diagnosis not present

## 2018-12-18 DIAGNOSIS — Z7901 Long term (current) use of anticoagulants: Secondary | ICD-10-CM | POA: Insufficient documentation

## 2018-12-18 DIAGNOSIS — F17210 Nicotine dependence, cigarettes, uncomplicated: Secondary | ICD-10-CM | POA: Diagnosis not present

## 2018-12-18 DIAGNOSIS — Z8719 Personal history of other diseases of the digestive system: Secondary | ICD-10-CM | POA: Insufficient documentation

## 2018-12-18 DIAGNOSIS — R809 Proteinuria, unspecified: Secondary | ICD-10-CM | POA: Insufficient documentation

## 2018-12-18 DIAGNOSIS — C9001 Multiple myeloma in remission: Secondary | ICD-10-CM

## 2018-12-18 DIAGNOSIS — I129 Hypertensive chronic kidney disease with stage 1 through stage 4 chronic kidney disease, or unspecified chronic kidney disease: Secondary | ICD-10-CM | POA: Diagnosis not present

## 2018-12-18 DIAGNOSIS — Z803 Family history of malignant neoplasm of breast: Secondary | ICD-10-CM | POA: Insufficient documentation

## 2018-12-18 DIAGNOSIS — Z7984 Long term (current) use of oral hypoglycemic drugs: Secondary | ICD-10-CM | POA: Diagnosis not present

## 2018-12-18 DIAGNOSIS — Z809 Family history of malignant neoplasm, unspecified: Secondary | ICD-10-CM | POA: Insufficient documentation

## 2018-12-18 DIAGNOSIS — R42 Dizziness and giddiness: Secondary | ICD-10-CM | POA: Insufficient documentation

## 2018-12-18 DIAGNOSIS — R5383 Other fatigue: Secondary | ICD-10-CM | POA: Diagnosis not present

## 2018-12-18 DIAGNOSIS — E611 Iron deficiency: Secondary | ICD-10-CM | POA: Diagnosis not present

## 2018-12-18 DIAGNOSIS — M069 Rheumatoid arthritis, unspecified: Secondary | ICD-10-CM | POA: Diagnosis not present

## 2018-12-18 DIAGNOSIS — Z7902 Long term (current) use of antithrombotics/antiplatelets: Secondary | ICD-10-CM | POA: Insufficient documentation

## 2018-12-18 DIAGNOSIS — E1122 Type 2 diabetes mellitus with diabetic chronic kidney disease: Secondary | ICD-10-CM | POA: Diagnosis not present

## 2018-12-18 DIAGNOSIS — R8290 Unspecified abnormal findings in urine: Secondary | ICD-10-CM | POA: Diagnosis not present

## 2018-12-18 DIAGNOSIS — D631 Anemia in chronic kidney disease: Secondary | ICD-10-CM | POA: Insufficient documentation

## 2018-12-18 DIAGNOSIS — R197 Diarrhea, unspecified: Secondary | ICD-10-CM

## 2018-12-18 DIAGNOSIS — R7989 Other specified abnormal findings of blood chemistry: Secondary | ICD-10-CM

## 2018-12-18 DIAGNOSIS — Z8546 Personal history of malignant neoplasm of prostate: Secondary | ICD-10-CM | POA: Insufficient documentation

## 2018-12-18 DIAGNOSIS — Z79899 Other long term (current) drug therapy: Secondary | ICD-10-CM | POA: Insufficient documentation

## 2018-12-18 DIAGNOSIS — R195 Other fecal abnormalities: Secondary | ICD-10-CM

## 2018-12-18 DIAGNOSIS — N183 Chronic kidney disease, stage 3 (moderate): Secondary | ICD-10-CM | POA: Diagnosis present

## 2018-12-18 DIAGNOSIS — Z8349 Family history of other endocrine, nutritional and metabolic diseases: Secondary | ICD-10-CM | POA: Insufficient documentation

## 2018-12-18 LAB — COMPREHENSIVE METABOLIC PANEL
ALT: 14 U/L (ref 0–44)
AST: 14 U/L — ABNORMAL LOW (ref 15–41)
Albumin: 3.2 g/dL — ABNORMAL LOW (ref 3.5–5.0)
Alkaline Phosphatase: 58 U/L (ref 38–126)
Anion gap: 4 — ABNORMAL LOW (ref 5–15)
BUN: 22 mg/dL (ref 8–23)
CO2: 23 mmol/L (ref 22–32)
Calcium: 8.7 mg/dL — ABNORMAL LOW (ref 8.9–10.3)
Chloride: 111 mmol/L (ref 98–111)
Creatinine, Ser: 1.49 mg/dL — ABNORMAL HIGH (ref 0.61–1.24)
GFR calc Af Amer: 52 mL/min — ABNORMAL LOW (ref >60)
GFR calc non Af Amer: 45 mL/min — ABNORMAL LOW (ref >60)
Glucose, Bld: 95 mg/dL (ref 70–99)
Potassium: 4.3 mmol/L (ref 3.5–5.1)
Sodium: 138 mmol/L (ref 135–145)
Total Bilirubin: 0.4 mg/dL (ref 0.3–1.2)
Total Protein: 8.1 g/dL (ref 6.5–8.1)

## 2018-12-18 LAB — IRON AND TIBC
Iron: 17 ug/dL — ABNORMAL LOW (ref 45–182)
Saturation Ratios: 5 % — ABNORMAL LOW (ref 17.9–39.5)
TIBC: 356 ug/dL (ref 250–450)
UIBC: 339 ug/dL

## 2018-12-18 LAB — CBC WITH DIFFERENTIAL/PLATELET
Abs Immature Granulocytes: 0.07 10*3/uL (ref 0.00–0.07)
Basophils Absolute: 0 10*3/uL (ref 0.0–0.1)
Basophils Relative: 0 %
Eosinophils Absolute: 0.6 10*3/uL — ABNORMAL HIGH (ref 0.0–0.5)
Eosinophils Relative: 6 %
HCT: 32.5 % — ABNORMAL LOW (ref 39.0–52.0)
Hemoglobin: 9.8 g/dL — ABNORMAL LOW (ref 13.0–17.0)
Immature Granulocytes: 1 %
Lymphocytes Relative: 12 %
Lymphs Abs: 1.2 10*3/uL (ref 0.7–4.0)
MCH: 22.6 pg — ABNORMAL LOW (ref 26.0–34.0)
MCHC: 30.2 g/dL (ref 30.0–36.0)
MCV: 75.1 fL — ABNORMAL LOW (ref 80.0–100.0)
Monocytes Absolute: 0.8 10*3/uL (ref 0.1–1.0)
Monocytes Relative: 8 %
Neutro Abs: 7.7 10*3/uL (ref 1.7–7.7)
Neutrophils Relative %: 73 %
Platelets: 296 10*3/uL (ref 150–400)
RBC: 4.33 MIL/uL (ref 4.22–5.81)
RDW: 17.9 % — ABNORMAL HIGH (ref 11.5–15.5)
WBC: 10.4 10*3/uL (ref 4.0–10.5)
nRBC: 0 % (ref 0.0–0.2)

## 2018-12-18 LAB — FERRITIN: Ferritin: 16 ng/mL — ABNORMAL LOW (ref 24–336)

## 2018-12-19 LAB — MULTIPLE MYELOMA PANEL, SERUM
Albumin SerPl Elph-Mcnc: 3.1 g/dL (ref 2.9–4.4)
Albumin/Glob SerPl: 0.8 (ref 0.7–1.7)
Alpha 1: 0.2 g/dL (ref 0.0–0.4)
Alpha2 Glob SerPl Elph-Mcnc: 0.9 g/dL (ref 0.4–1.0)
B-Globulin SerPl Elph-Mcnc: 1 g/dL (ref 0.7–1.3)
Gamma Glob SerPl Elph-Mcnc: 1.8 g/dL (ref 0.4–1.8)
Globulin, Total: 4 g/dL — ABNORMAL HIGH (ref 2.2–3.9)
IgA: 251 mg/dL (ref 61–437)
IgG (Immunoglobin G), Serum: 2116 mg/dL — ABNORMAL HIGH (ref 603–1613)
IgM (Immunoglobulin M), Srm: 88 mg/dL (ref 15–143)
Total Protein ELP: 7.1 g/dL (ref 6.0–8.5)

## 2018-12-19 LAB — KAPPA/LAMBDA LIGHT CHAINS
Kappa free light chain: 138.4 mg/L — ABNORMAL HIGH (ref 3.3–19.4)
Kappa, lambda light chain ratio: 3.43 — ABNORMAL HIGH (ref 0.26–1.65)
Lambda free light chains: 40.3 mg/L — ABNORMAL HIGH (ref 5.7–26.3)

## 2018-12-25 ENCOUNTER — Inpatient Hospital Stay (HOSPITAL_BASED_OUTPATIENT_CLINIC_OR_DEPARTMENT_OTHER): Payer: Medicare Other | Admitting: Oncology

## 2018-12-25 ENCOUNTER — Inpatient Hospital Stay: Payer: Medicare Other

## 2018-12-25 ENCOUNTER — Other Ambulatory Visit: Payer: Self-pay

## 2018-12-25 ENCOUNTER — Encounter: Payer: Self-pay | Admitting: Oncology

## 2018-12-25 VITALS — BP 110/71 | HR 90 | Temp 97.9°F | Ht 71.0 in | Wt 189.5 lb

## 2018-12-25 DIAGNOSIS — D509 Iron deficiency anemia, unspecified: Secondary | ICD-10-CM

## 2018-12-25 DIAGNOSIS — R8290 Unspecified abnormal findings in urine: Secondary | ICD-10-CM

## 2018-12-25 DIAGNOSIS — Z7984 Long term (current) use of oral hypoglycemic drugs: Secondary | ICD-10-CM

## 2018-12-25 DIAGNOSIS — N183 Chronic kidney disease, stage 3 unspecified: Secondary | ICD-10-CM

## 2018-12-25 DIAGNOSIS — D500 Iron deficiency anemia secondary to blood loss (chronic): Secondary | ICD-10-CM

## 2018-12-25 DIAGNOSIS — Z8249 Family history of ischemic heart disease and other diseases of the circulatory system: Secondary | ICD-10-CM

## 2018-12-25 DIAGNOSIS — C9001 Multiple myeloma in remission: Secondary | ICD-10-CM | POA: Diagnosis not present

## 2018-12-25 DIAGNOSIS — Z8349 Family history of other endocrine, nutritional and metabolic diseases: Secondary | ICD-10-CM

## 2018-12-25 DIAGNOSIS — Z8719 Personal history of other diseases of the digestive system: Secondary | ICD-10-CM

## 2018-12-25 DIAGNOSIS — Z950 Presence of cardiac pacemaker: Secondary | ICD-10-CM

## 2018-12-25 DIAGNOSIS — D631 Anemia in chronic kidney disease: Secondary | ICD-10-CM | POA: Diagnosis not present

## 2018-12-25 DIAGNOSIS — R42 Dizziness and giddiness: Secondary | ICD-10-CM

## 2018-12-25 DIAGNOSIS — M069 Rheumatoid arthritis, unspecified: Secondary | ICD-10-CM

## 2018-12-25 DIAGNOSIS — Z8546 Personal history of malignant neoplasm of prostate: Secondary | ICD-10-CM

## 2018-12-25 DIAGNOSIS — E1122 Type 2 diabetes mellitus with diabetic chronic kidney disease: Secondary | ICD-10-CM

## 2018-12-25 DIAGNOSIS — F17210 Nicotine dependence, cigarettes, uncomplicated: Secondary | ICD-10-CM

## 2018-12-25 DIAGNOSIS — R809 Proteinuria, unspecified: Secondary | ICD-10-CM

## 2018-12-25 DIAGNOSIS — Z803 Family history of malignant neoplasm of breast: Secondary | ICD-10-CM

## 2018-12-25 DIAGNOSIS — E611 Iron deficiency: Secondary | ICD-10-CM

## 2018-12-25 DIAGNOSIS — Z79899 Other long term (current) drug therapy: Secondary | ICD-10-CM

## 2018-12-25 DIAGNOSIS — Z809 Family history of malignant neoplasm, unspecified: Secondary | ICD-10-CM

## 2018-12-25 DIAGNOSIS — Z7901 Long term (current) use of anticoagulants: Secondary | ICD-10-CM

## 2018-12-25 DIAGNOSIS — I129 Hypertensive chronic kidney disease with stage 1 through stage 4 chronic kidney disease, or unspecified chronic kidney disease: Secondary | ICD-10-CM

## 2018-12-25 DIAGNOSIS — Z833 Family history of diabetes mellitus: Secondary | ICD-10-CM

## 2018-12-25 DIAGNOSIS — R5383 Other fatigue: Secondary | ICD-10-CM

## 2018-12-25 DIAGNOSIS — Z7902 Long term (current) use of antithrombotics/antiplatelets: Secondary | ICD-10-CM

## 2018-12-25 MED ORDER — IRON SUCROSE 20 MG/ML IV SOLN
200.0000 mg | Freq: Once | INTRAVENOUS | Status: AC
  Administered 2018-12-25: 200 mg via INTRAVENOUS
  Filled 2018-12-25: qty 10

## 2018-12-25 MED ORDER — SODIUM CHLORIDE 0.9 % IV SOLN: 200.0000 mg | INTRAVENOUS | Status: DC

## 2018-12-25 MED ORDER — SODIUM CHLORIDE 0.9 % IV SOLN
INTRAVENOUS | Status: DC
  Administered 2018-12-25: 14:00:00 via INTRAVENOUS
  Filled 2018-12-25: qty 250

## 2018-12-25 NOTE — Progress Notes (Signed)
Patient stated that he had been feeling very tired and fatigued.

## 2018-12-25 NOTE — Progress Notes (Signed)
Hematology/Oncology Follow Up Note [employer] [phone] [phone]  Patient Care Team: Jodi Marble, MD as PCP - General (Internal Medicine) Bary Castilla, Forest Gleason, MD (General Surgery) Edrick Kins, MD as Rounding Team (Internal Medicine) Hillary Bow, MD as Consulting Physician (Internal Medicine) [name], MD as Surgeon (Gastroenterology) Isaias Cowman, MD as Consulting Physician (Cardiology) Earlie Server, MD as Consulting Physician (Oncology)  REFERRING PROVIDER: Dr.Lateef, [place]  REASON FOR VISIT Follow up for management of plasma cell dyscrasia.  And iron deficiency anemia.   HISTORY OF PRESENTING ILLNESS:  78 y.o.  male with PMH listed below who presents to follow up on the evaluation and management of his abnormal urine protein electrophoresis results. I reviewed the records from [facility], [place] and [place] was performed and HemOnc related medical problems are listed below.  He lives with a friend. He has 3 adult children.   1 Chronic Kidney disease, Stage III: patient follows up with Dr.Lateef.  2 Proteinuria:  02/18/2017: Albumin/Creatinine ratio was 7, Urine protein electrophoresis,Random Urine revealed M Spike 43.2%, total protein of 43.'7mg'$ /dl,  01/05/2017 Albumin/Creatinine ratio was 455.2, Albumin 996.5, , 2.15.2018 Albumin/Creatinine ratio was 80.3, Albumin 73.3, Urine protein electrophoresis,Random Urine revealed M Spike 29.7%, total protein of 29.'1mg'$ /dl, 06/30/2016 Serum protein electrophoresis did not detect M spike.  Autoimmune disorder work up showed Negative anti dsDNA, RNP antibodies, smith antibody, Sjogren antibodies, anti Jo-1, positive antichromotin antibodies, positive ANA,  3 Anemia of chronic kidney disease:  4 Iron deficiency anemia: history of iron deficiency, ferritin was 7 on 08/11/2016. He had EGD and colonoscopy that were done this year  which showed duodenitis/esphagitis/diverticulosis/benigh polyps. # small bowel AVMs was ablated at [facility].   5 Rheumatoid arthritis: he follows up with Dr.Kernodle and is on chronic MTX. He Is currently off MTX reports joint pains are controlled, not quite symptomatic.   Patient denies any persistent bone pain or any pain. He continue to feel lack of energy, persistent, not improved with resting or taking naps. He also has lost 25 pounds in the past year which was unintentional.   # Rheumatoid arthritis and currently is off MTX.  # It is ambiguous whether he has truly symptomatic multiple myeloma or a smoldering myeloma, given that anemia can be secondary to iron deficiency and CKD.  The hypermetabolic 2.1 cm hypermetabolic soft tissue lesion in the left heel, can reflect a Plasmacytoma,which should have been biopsied. However patient initially denies being on any blood thinner, later he was found to be on Plavix with pre biopsy questionnaire screening, and biopsy was held. Patient has to contact cardiologist to hold plavix for seven days before procedure. He feels frustrated about multiple workup and meanwhile he becomes more fatigues, with worsening of kidney function. Patient was reluctant about proceeding additional invasive procedures and wants to start treatment. Decision was made to start active MM treatment.   # He was evaluated by [facility] bone marrow transplant team for autologous bone marrow transplant. He is not considered to  good candidate for transplant. Patient cardiologist Dr. Chancy Milroy had started patient on Lasix 20 mg daily. Lasix was discontinued by [facility] bone marrow transplant team as patient was having dizziness and borderline blood pressure during his clinic visit there.    Current Treatment:  S/p RVD x 4 cycles tolerates well except cytopenia.  Revlimid 10 mg PO once per day on days 1 to 14 (dose reduced due to GFR) Revlimid renal dosing of '10mg'$ ,  Velcade  1.3 mg/m2 IV once per day on  days 1,  8, 15 Dexamethasone 20 mg PO once per day on days 1, 8, 15 given patient's diabetes Given patient's multiple comorbidity and the side effects of cytopenia during his RVD treatment and the uncertainty of this is really a site of plasmacytoma or not, I recommend not to continue him on maintenance Revlimid.   INTERVAL HISTORY Patient presents for follow-up of management of multiple myeloma/smoldering multiple myeloma and iron deficiency anemia. Patient is currently not on maintenance treatments. He reports feeling well.  Endorses fatigue with no exacerbating or alleviating factors. No shortness of breath. Denies any chest pain, abdominal pain, lower extremity pain or swelling .  Denies any unintentional weight loss.  Appetite is fair. His weight has been stable since last visit. Chronic joint pain due to rheumatoid arthritis..  On Plaquenil. Patient denies seeing any blood in the stool.  Review of Systems  Constitutional: Negative for appetite change, chills, fatigue, fever and unexpected weight change.  HENT:   Negative for hearing loss and voice change.   Eyes: Negative for eye problems and icterus.  Respiratory: Negative for chest tightness, cough and shortness of breath.   Cardiovascular: Negative for chest pain and leg swelling.  Gastrointestinal: Negative for abdominal distention, abdominal pain and diarrhea.  Endocrine: Negative for hot flashes.  Genitourinary: Negative for difficulty urinating, dysuria and frequency.   Musculoskeletal: Negative for arthralgias.  Skin: Negative for itching and rash.  Neurological: Negative for light-headedness and numbness.  Hematological: Negative for adenopathy. Does not bruise/bleed easily.  Psychiatric/Behavioral: Negative for confusion.     MEDICAL HISTORY:  Past Medical History:  Diagnosis Date  . Anxiety   . Arthritis    rheumatoid  . Atrial fibrillation (HCC)    hx of  . Cancer ([place])   . Chronic kidney disease   . Colon  polyp 07-07-15   TUBULAR ADENOMA WITH AT LEAST HIGH-GRADE / Dr Rayann Heman  . Diabetes mellitus without complication ([place])   . Dysrhythmia    bradycardia...Marland KitchenMarland Kitchen2 to 1 heart block  . GERD (gastroesophageal reflux disease)   . Heart murmur    patient unaware of history of murmur  . Hypercholesterolemia   . Hypertension   . Presence of permanent cardiac pacemaker   . Prostate cancer ([place]) 01/01/13, 01/30/14   Gleason 3+4=7, volume 46.6 cc  . Rheumatoid arthritis ([place])   . S/P radiation therapy  04/03/2014 through 06/04/2014                                                      Prostate 7800 cGy in 40 sessions                          . Sleep apnea    uses cpap but it is not in the hospital with him    SURGICAL HISTORY: Past Surgical History:  Procedure Laterality Date  . ABDOMINAL AORTIC ANEURYSM REPAIR  11/2013  . COLON SURGERY  March 2017   Right hemicolectomy for tubulovillous adenoma with high-grade dysplasia.  . COLONOSCOPY WITH PROPOFOL N/A 07/07/2015   Procedure: COLONOSCOPY WITH PROPOFOL;  Surgeon: Josefine Class, MD;  Location: [facility] ENDOSCOPY;  Service: Endoscopy;  Laterality: N/A;  . COLONOSCOPY WITH PROPOFOL N/A 10/28/2016   Procedure: COLONOSCOPY WITH PROPOFOL;  Surgeon:  [name], MD;  Location: [facility] ENDOSCOPY;  Service: Endoscopy;  Laterality: N/A;  . ESOPHAGOGASTRODUODENOSCOPY (EGD) WITH PROPOFOL N/A 10/28/2016   Procedure: ESOPHAGOGASTRODUODENOSCOPY (EGD) WITH PROPOFOL;  Surgeon: [name], MD;  Location: [facility] ENDOSCOPY;  Service: Endoscopy;  Laterality: N/A;  . GIVENS CAPSULE STUDY N/A 07/25/2017   Procedure: GIVENS CAPSULE STUDY;  Surgeon: [name], MD;  Location[facility] ENDOSCOPY;  Service: Gastroenterology;  Laterality: N/A;  . LAPAROSCOPIC RIGHT COLECTOMY Right 08/08/2015   Procedure: LAPAROSCOPIC RIGHT COLECTOMY;  Surgeon: Robert Bellow, MD;  Location: [facility] ORS;  Service: General;  Laterality: Right;  . NASAL SINUS SURGERY    . PACEMAKER INSERTION Left 08/26/2016    Procedure: INSERTION PACEMAKER;  Surgeon: Isaias Cowman, MD;  Location: [facility] ORS;  Service: Cardiovascular;  Laterality: Left;  . PROSTATE BIOPSY  01/01/13, 01/30/14   Gleason 3+3=6, vol 46.6 cc  . TONSILLECTOMY    . uvula surgery     for sleep apnea    SOCIAL HISTORY: Social History   Socioeconomic History  . Marital status: Divorced    Spouse name: Not on file  . Number of children: Not on file  . Years of education: Not on file  . Highest education level: Not on file  Occupational History  . Not on file  Social Needs  . Financial resource strain: Not on file  . Food insecurity    Worry: Not on file    Inability: Not on file  . Transportation needs    Medical: Not on file    Non-medical: Not on file  Tobacco Use  . Smoking status: Current Every Day Smoker    Packs/day: 1.00    Years: 60.00    Pack years: 60.00    Types: Cigarettes  . Smokeless tobacco: Never Used  . Tobacco comment: DOWN TO 1/2 PPD  Substance and Sexual Activity  . Alcohol use: Yes    Alcohol/week: 1.0 standard drinks    Types: 1 Cans of beer per week    Comment: occasionally  . Drug use: No  . Sexual activity: Not Currently  Lifestyle  . Physical activity    Days per week: Not on file    Minutes per session: Not on file  . Stress: Not on file  Relationships  . Social Herbalist on phone: Not on file    Gets together: Not on file    Attends religious service: Not on file    Active member of club or organization: Not on file    Attends meetings of clubs or organizations: Not on file    Relationship status: Not on file  . Intimate partner violence    Fear of current or ex partner: Not on file    Emotionally abused: Not on file    Physically abused: Not on file    Forced sexual activity: Not on file  Other Topics Concern  . Not on file  Social History Narrative  . Not on file    FAMILY HISTORY: Family History  Problem Relation Age of Onset  . Heart attack Mother   .  Cirrhosis Father   . Pancreatic cancer Brother   . Diabetes Brother   . Diabetes Daughter        medication induced for cancer treatments  . Cancer Daughter        breast, brain    ALLERGIES:  has No Known Allergies.  MEDICATIONS:  Current Outpatient Medications  Medication Sig Dispense Refill  . acetaminophen (TYLENOL) 325  MG tablet Take 325 mg by mouth every 6 (six) hours as needed.    Marland Kitchen albuterol (PROVENTIL HFA;VENTOLIN HFA) 108 (90 Base) MCG/ACT inhaler     . ALPRAZolam (XANAX) 0.25 MG tablet Take by mouth 2 (two) times daily as needed.     Marland Kitchen amLODipine-benazepril (LOTREL) 5-20 MG capsule Take 1 capsule by mouth daily.    Marland Kitchen aspirin 81 MG chewable tablet Chew 81 mg by mouth daily.    Marland Kitchen atorvastatin (LIPITOR) 80 MG tablet Take 80 mg by mouth at bedtime.     . benazepril (LOTENSIN) 20 MG tablet Take 20 mg by mouth daily.    . benzonatate (TESSALON) 100 MG capsule Take 100 mg by mouth 3 (three) times daily.    . Butalbital-APAP-Caffeine (FIORICET) 50-300-40 MG CAPS Take 1 capsule every four to six hours as needed    . Cholecalciferol (D3-50) 50000 units capsule Take 50,000 Units by mouth once a week.     . clopidogrel (PLAVIX) 75 MG tablet Take by mouth.    . colchicine 0.6 MG tablet     . ELIQUIS 2.5 MG TABS tablet     . fexofenadine (ALLEGRA) 180 MG tablet Take 180 mg by mouth daily.    . fluticasone (FLONASE) 50 MCG/ACT nasal spray Place 2 sprays into both nostrils daily as needed for rhinitis.     . Fluticasone Furoate-Vilanterol (BREO ELLIPTA) 100-25 MCG/INH AEPB Inhale 1 puff into the lungs daily.     . folic acid (FOLVITE) 1 MG tablet Take by mouth.    . furosemide (LASIX) 20 MG tablet Take 20 mg by mouth daily.    Marland Kitchen gabapentin (NEURONTIN) 300 MG capsule Take 300 mg by mouth 3 (three) times daily.  2  . gabapentin (NEURONTIN) 400 MG capsule Take 400 mg by mouth 3 (three) times daily.    Marland Kitchen glucose blood (ONE TOUCH ULTRA TEST) test strip     . hydroxychloroquine (PLAQUENIL)  200 MG tablet Take 400 mg by mouth daily.  2  . isosorbide mononitrate (IMDUR) 30 MG 24 hr tablet Take 30 mg by mouth daily.    Marland Kitchen loperamide (IMODIUM) 2 MG capsule Take 1 capsule (2 mg total) by mouth See admin instructions. With onset of diarrhea, take 22m followed by 248mevery 2 hours resolved. Maximum: 16 mg/day 120 capsule 0  . metFORMIN (GLUCOPHAGE) 500 MG tablet Take 500 mg by mouth daily with breakfast.     . metoprolol succinate (TOPROL-XL) 25 MG 24 hr tablet Take 25 mg by mouth daily.    . ondansetron (ZOFRAN) 8 MG tablet Take 1 tablet (8 mg total) 2 (two) times daily as needed by mouth (Nausea or vomiting). 30 tablet 1  . pantoprazole (PROTONIX) 20 MG tablet Take 20 mg by mouth daily.    . pantoprazole (PROTONIX) 40 MG tablet Take 1 tablet (40 mg total) by mouth 2 (two) times daily. 60 tablet 3  . traMADol-acetaminophen (ULTRACET) 37.5-325 MG tablet     . triamcinolone cream (KENALOG) 0.1 % Apply 1 application topically 2 (two) times daily.     No current facility-administered medications for this visit.       . Marland KitchenPHYSICAL EXAMINATION: ECOG PERFORMANCE STATUS: 1 - Symptomatic but completely ambulatory Vitals:   12/25/18 1318  BP: 110/71  Pulse: 90  Temp: 97.9 F (36.6 C)   Filed Weights   12/25/18 1318  Weight: 189 lb 8 oz (86 kg)   Physical Exam  Constitutional: He is oriented to person, place,  and time and well-developed, well-nourished, and in no distress. No distress.  HENT:  Head: Normocephalic and atraumatic.  Nose: Nose normal.  Mouth/Throat: Oropharynx is clear and moist. No oropharyngeal exudate.  Eyes: Pupils are equal, round, and reactive to light. EOM are normal. Right eye exhibits no discharge. Left eye exhibits no discharge. No scleral icterus.  Neck: Normal range of motion. Neck supple. No JVD present.  Cardiovascular: Normal rate, regular rhythm and normal heart sounds. Exam reveals no friction rub.  No murmur heard. Pulmonary/Chest: Effort normal and  breath sounds normal. No respiratory distress. He has no wheezes. He has no rales. He exhibits no tenderness.  Abdominal: Soft. Bowel sounds are normal. He exhibits no distension and no mass. There is no abdominal tenderness. There is no rebound.  Musculoskeletal: Normal range of motion.        General: No tenderness or edema.  Lymphadenopathy:    He has no cervical adenopathy.  Neurological: He is alert and oriented to person, place, and time. No cranial nerve deficit. He exhibits normal muscle tone. Coordination normal.  Skin: Skin is warm and dry. No rash noted. He is not diaphoretic. No erythema.  Psychiatric: Affect and judgment normal.    LABORATORY DATA:  I have reviewed the data as listed Lab Results  Component Value Date   WBC 10.4 12/18/2018   HGB 9.8 (L) 12/18/2018   HCT 32.5 (L) 12/18/2018   MCV 75.1 (L) 12/18/2018   PLT 296 12/18/2018   Recent Labs    05/15/18 0925  09/22/18 1108 09/29/18 1133 12/18/18 1344  NA 140   < > 139 135 138  K 4.3   < > 4.3 4.2 4.3  CL 108   < > 109 107 111  CO2 24   < > _0 GLUCOSE 95   < > 100* 90 95  BUN 11   < > _1 CREATININE 1.14   < > 1.53* 1.24 1.49*  CALCIUM 9.0   < > 8.8* 8.9 8.7*  GFRNONAA >60   < > 44* 56* 45*  GFRAA >60   < > 50* >60 52*  PROT 8.1  --  8.6*  --  8.1  ALBUMIN 3.8  --  3.7  --  3.2*  AST 18  --  15  --  14*  ALT 16  --  12  --  14  ALKPHOS 79  --  67  --  58  BILITOT 0.5  --  0.4  --  0.4   < > = values in this interval not displayed.    SPEP: no M spike Serum free light chain Kappa/Lamda ratio 13.04 UPEP  Free light chain Kappa/lamda ratio 162.43, M spike 125m/24 hour   Bone marrow biopsy 03/21/2017  Bone Marrow, Aspirate,Biopsy, and Clot, left iliac - HYPERCELLULAR BONE MARROW FOR AGE WITH TRILINEAGE HEMATOPOIESIS. - PLASMACYTOSIS (PLASMA CELLS 8%)  Karyotype: loss of Y chromosome Cytogenetic MDS FISH Panel negative.   Bone marrow 04/06/2017  Bone Marrow, Aspirate,Biopsy, and  Clot, right iliac and core - HYPERCELLULAR BONE MARROW FOR AGE WITH PLASMA CELL NEOPLASM - TRILINEAGE HEMATOPOIESIS. - SEE COMMENT. PERIPHERAL BLOOD: - MICROCYTIC-HYPOCHROMIC ANEMIA. Diagnosis Note The bone marrow is hypercellular with trilineage hematopoiesis but with relative abundance of erythroid precursors and increased number of megakaryocytes with nonspecific changes. Significant dyspoiesis is not seen. Iron stores are present with no ring sideroblasts. The plasma cells are increased in number representing 8% of all cells in  the aspirate although focal areas in the core biopsy show 10 to 20% as primarily seen by CD138 stain. In situ hybridization for kappa and lambda light chains show kappa light chain restriction consistent with plasma cell neoplasm. Correlation with cytogenetic and FISH studies is recommended. (BNS:ecj/gt 04/07/2017)  IMAGE STUDIES I have personally reviewed below image results.  03/15/2017 DG bone survey Met: Nonspecific lucent lesion in the distal right ulna measuring 15-16 mm, but otherwise normal bone mineralization for age throughout the visible skeleton. A solitary lytic lesion in a distal extremity would be an unusual presentation of multiple myeloma, and I favor the distal right ulna lesion is benign. Recommend correlation with serum and urine protein electrophoresis.  04/14/2017 PET scan: 1. 2.1 cm hypermetabolic soft tissue lesion in the left heel, adjacent to the calcaneal tuberosity. 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 2. Mottled FDG accumulation diffusely in the marrow space without other frankly overt hypermetabolic bony lesion. 3. Coronary artery and thoracoabdominal aortic atherosclerosis with abdominal aortic stent graft visualized in situ. 4. Bilateral renal cysts and bilateral nonobstructing renal stones.  08/24/2017 PET scan 1. No hypermetabolic osseous lesions. No definite signs of multiple myeloma on CT imaging. 2. Interval improvement in  soft tissue activity along the medial aspect of the left calcaneal tuberosity, likely plantar fasciitis. 3. New small left pleural effusion. 4. Bilateral inguinal hernias containing small bowel on the right and sigmoid colon on the left. No evidence of incarceration or obstruction. 5. Otherwise stable incidental findings including diffuse atherosclerosis, bilateral renal cysts, emphysema and sigmoid diverticulosis.  ASSESSMENT & PLAN:  1. Iron deficiency anemia due to chronic blood loss   2. Multiple myeloma in remission ([place])   3. CKD (chronic kidney disease) stage 3, GFR 30-59 ml/min (HCC)    #Smoldering multiple myeloma or active myeloma if hypermetabolic lesion on PET scan is related to myeloma. He was treated with 4 cycles of RVD. Patient is not on maintenance treatment due to multiple comorbidities and borderline smoldering multiple myeloma disease status. He has been doing well clinically. Labs are reviewed and discussed with patient. Multiple myeloma panel shows no M spike.  Free light chain ratio has been stable Clinically he has no new bone pain. He had follow-up left foot/ankle x-ray as a follow-up of his left heel lesion which was initially presented on PET scan and not detectable on follow-up PET scan.  Images were reviewed by me.  No suspicious acute or subacute osseous abnormality.  #Iron deficiency anemia, hemoglobin has dropped to 9.8.  Patient has a history of iron deficiency anemia secondary to GI bleeding.  Previously he has been evaluated by gastroenterology Dr. Vicente Males and had extensive work-up.  He was also referred to [facility] for ablation of AVM.  I recommend patient to reestablish care with gastroenterology Dr. Vicente Males to discuss further management plan.  Communicated with Dr. Vicente Males via secure chat  I recommend IV Venofer weekly x4 to bring up his iron stores and improve his hemoglobin. #Chronic renal insufficiency, stage III,  Fluctuating creatinine.  Most recent creatinine at  1.49.  Continue monitor.  Avoid NSAIDS and other nephrotoxic. Follow-up pain 3 months with reevaluation of blood work and MD reassessment.  Orders Placed This Encounter  Procedures  . CBC with Differential/Platelet    Standing Status:   Future    Standing Expiration Date:   12/25/2019  . Ferritin    Standing Status:   Future    Standing Expiration Date:   12/25/2019  . Iron and  TIBC    Standing Status:   Future    Standing Expiration Date:   12/25/2019    Earlie Server, MD, PhD Hematology Oncology [facility] Pager- [phone] 12/25/2018

## 2018-12-29 ENCOUNTER — Ambulatory Visit (INDEPENDENT_AMBULATORY_CARE_PROVIDER_SITE_OTHER): Payer: Medicare Other

## 2018-12-29 ENCOUNTER — Encounter (INDEPENDENT_AMBULATORY_CARE_PROVIDER_SITE_OTHER): Payer: Self-pay | Admitting: Vascular Surgery

## 2018-12-29 ENCOUNTER — Ambulatory Visit (INDEPENDENT_AMBULATORY_CARE_PROVIDER_SITE_OTHER): Payer: Medicare Other | Admitting: Vascular Surgery

## 2018-12-29 ENCOUNTER — Other Ambulatory Visit: Payer: Self-pay

## 2018-12-29 VITALS — BP 107/68 | HR 85 | Resp 16 | Wt 187.4 lb

## 2018-12-29 DIAGNOSIS — I714 Abdominal aortic aneurysm, without rupture, unspecified: Secondary | ICD-10-CM

## 2018-12-29 DIAGNOSIS — F17210 Nicotine dependence, cigarettes, uncomplicated: Secondary | ICD-10-CM | POA: Diagnosis not present

## 2018-12-29 DIAGNOSIS — I10 Essential (primary) hypertension: Secondary | ICD-10-CM

## 2018-12-29 DIAGNOSIS — Z79899 Other long term (current) drug therapy: Secondary | ICD-10-CM

## 2018-12-29 DIAGNOSIS — Z95828 Presence of other vascular implants and grafts: Secondary | ICD-10-CM

## 2018-12-29 DIAGNOSIS — E1140 Type 2 diabetes mellitus with diabetic neuropathy, unspecified: Secondary | ICD-10-CM

## 2018-12-29 DIAGNOSIS — F17200 Nicotine dependence, unspecified, uncomplicated: Secondary | ICD-10-CM

## 2018-12-29 NOTE — Assessment & Plan Note (Signed)
His aneurysm duplex today shows an aneurysm sac of just over 4 cm in maximal diameter which is slightly larger than it was last year but no endoleak is present and there is good flow through the stent graft.  He is overall doing well.  We will continue annual surveillance follow-up at this point.  Contact our office with any problems in the interim

## 2018-12-29 NOTE — Progress Notes (Signed)
MRN : [id number]  Omar [name] is a 78 y.o. (31-Dec-1940) male who presents with chief complaint of  Chief Complaint  Patient presents with  . Follow-up    ultrasound follow up  .  History of Present Illness: Patient returns today in follow up of his abdominal aortic aneurysm.  He is about 4 years status post aneurysm repair.  He is doing well today without any aneurysm related symptoms.  He has some neuropathic pain in his legs but no lifestyle limiting claudication or ischemic rest pain.  He denies any back or abdominal pain or signs of peripheral embolization.  His aneurysm duplex today shows an aneurysm sac of just over 4 cm in maximal diameter which is slightly larger than it was last year but no endoleak is present and there is good flow through the stent graft.  Current Outpatient Medications  Medication Sig Dispense Refill  . acetaminophen (TYLENOL) 325 MG tablet Take 325 mg by mouth every 6 (six) hours as needed.    Marland Kitchen albuterol (PROVENTIL HFA;VENTOLIN HFA) 108 (90 Base) MCG/ACT inhaler     . ALPRAZolam (XANAX) 0.25 MG tablet Take by mouth 2 (two) times daily as needed.     Marland Kitchen amLODipine-benazepril (LOTREL) 5-20 MG capsule Take 1 capsule by mouth daily.    Marland Kitchen atorvastatin (LIPITOR) 80 MG tablet Take 80 mg by mouth at bedtime.     . benazepril (LOTENSIN) 20 MG tablet Take 20 mg by mouth daily.    . benzonatate (TESSALON) 100 MG capsule Take 100 mg by mouth 3 (three) times daily.    . Butalbital-APAP-Caffeine (FIORICET) 50-300-40 MG CAPS Take 1 capsule every four to six hours as needed    . Cholecalciferol (D3-50) 50000 units capsule Take 50,000 Units by mouth once a week.     . colchicine 0.6 MG tablet     . ELIQUIS 2.5 MG TABS tablet     . fexofenadine (ALLEGRA) 180 MG tablet Take 180 mg by mouth daily.    . fluticasone (FLONASE) 50 MCG/ACT nasal spray Place 2 sprays into both nostrils daily as needed for rhinitis.     . Fluticasone Furoate-Vilanterol (BREO ELLIPTA) 100-25  MCG/INH AEPB Inhale 1 puff into the lungs daily.     . folic acid (FOLVITE) 1 MG tablet Take by mouth.    . furosemide (LASIX) 20 MG tablet Take 20 mg by mouth daily.    Marland Kitchen gabapentin (NEURONTIN) 400 MG capsule Take 400 mg by mouth 3 (three) times daily.    Marland Kitchen glucose blood (ONE TOUCH ULTRA TEST) test strip     . hydroxychloroquine (PLAQUENIL) 200 MG tablet Take 400 mg by mouth daily.  2  . isosorbide mononitrate (IMDUR) 30 MG 24 hr tablet Take 30 mg by mouth daily.    . metFORMIN (GLUCOPHAGE) 500 MG tablet Take 500 mg by mouth daily with breakfast.     . metoprolol succinate (TOPROL-XL) 25 MG 24 hr tablet Take 25 mg by mouth daily.    . pantoprazole (PROTONIX) 20 MG tablet Take 20 mg by mouth daily.    Marland Kitchen triamcinolone cream (KENALOG) 0.1 % Apply 1 application topically 2 (two) times daily.    Marland Kitchen aspirin 81 MG chewable tablet Chew 81 mg by mouth daily.    . clopidogrel (PLAVIX) 75 MG tablet Take by mouth.    . gabapentin (NEURONTIN) 300 MG capsule Take 300 mg by mouth 3 (three) times daily.  2  . loperamide (IMODIUM) 2 MG capsule Take 1  capsule (2 mg total) by mouth See admin instructions. With onset of diarrhea, take 4m followed by 228mevery 2 hours resolved. Maximum: 16 mg/day (Patient not taking: Reported on 12/29/2018) 120 capsule 0  . ondansetron (ZOFRAN) 8 MG tablet Take 1 tablet (8 mg total) 2 (two) times daily as needed by mouth (Nausea or vomiting). (Patient not taking: Reported on 12/29/2018) 30 tablet 1  . pantoprazole (PROTONIX) 40 MG tablet Take 1 tablet (40 mg total) by mouth 2 (two) times daily. (Patient not taking: Reported on 12/29/2018) 60 tablet 3  . traMADol-acetaminophen (ULTRACET) 37.5-325 MG tablet      No current facility-administered medications for this visit.     Past Medical History:  Diagnosis Date  . Anxiety   . Arthritis    rheumatoid  . Atrial fibrillation (HCC)    hx of  . Cancer ([place]  . Chronic kidney disease   . Colon polyp 07-07-15   TUBULAR ADENOMA  WITH AT LEAST HIGH-GRADE / Dr ReRayann Heman. Diabetes mellitus without complication ([facility]  . Dysrhythmia    bradycardia.....Marland KitchenMarland Kitchento 1 heart block  . GERD (gastroesophageal reflux disease)   . Heart murmur    patient unaware of history of murmur  . Hypercholesterolemia   . Hypertension   . Presence of permanent cardiac pacemaker   . Prostate cancer ([facility]/28[place], 01/30/14   Gleason 3+4=7, volume 46.6 cc  . Rheumatoid arthritis ([place]  . S/P radiation therapy  04/03/2014 through 06/04/2014                                                      Prostate 7800 cGy in 40 sessions                          . Sleep apnea    uses cpap but it is not in the hospital with him    Past Surgical History:  Procedure Laterality Date  . ABDOMINAL AORTIC ANEURYSM REPAIR  11/2013  . COLON SURGERY  March 2017   Right hemicolectomy for tubulovillous adenoma with high-grade dysplasia.  . COLONOSCOPY WITH PROPOFOL N/A 07/07/2015   Procedure: COLONOSCOPY WITH PROPOFOL;  Surgeon: MaJosefine ClassMD;  [facility];  Service: Endoscopy;  Laterality: N/A;  . COLONOSCOPY WITH PROPOFOL N/A 10/28/2016   Procedure: COLONOSCOPY WITH PROPOFOL;  Surgeon: AnJonathon BellowsMD;  [facility];  Service: Endoscopy;  Laterality: N/A;  . ESOPHAGOGASTRODUODENOSCOPY (EGD) WITH PROPOFOL N/A 10/28/2016   Procedure: ESOPHAGOGASTRODUODENOSCOPY (EGD) WITH PROPOFOL;  Surgeon: AnJonathon BellowsMD;  [facility];  Service: Endoscopy;  Laterality: N/A;  . GIVENS CAPSULE STUDY N/A 07/25/2017   Procedure: GIVENS CAPSULE STUDY;  Surgeon: AnJonathon BellowsMD;  [facility];  Service: Gastroenterology;  Laterality: N/A;  . LAPAROSCOPIC RIGHT COLECTOMY Right 08/08/2015   Procedure: LAPAROSCOPIC RIGHT COLECTOMY;  Surgeon: JeRobert BellowMD;  [facility];  Service: General;  Laterality: Right;  . NASAL SINUS SURGERY    . PACEMAKER INSERTION Left 08/26/2016   Procedure: INSERTION PACEMAKER;  Surgeon: AlIsaias CowmanMD;  Location: [facility];  Service: Cardiovascular;  Laterality: Left;  . PROSTATE BIOPSY  01/01/13, 01/30/14   Gleason 3+3=6, vol 46.6 cc  . TONSILLECTOMY    . uvula surgery     for sleep  apnea    Social History Social History   Tobacco Use  . Smoking status: Current Every Day Smoker    Packs/day: 1.00    Years: 60.00    Pack years: 60.00    Types: Cigarettes  . Smokeless tobacco: Never Used  . Tobacco comment: DOWN TO 1/2 PPD  Substance Use Topics  . Alcohol use: Yes    Alcohol/week: 1.0 standard drinks    Types: 1 Cans of beer per week    Comment: occasionally  . Drug use: No     Family History Family History  Problem Relation Age of Onset  . Heart attack Mother   . Cirrhosis Father   . Pancreatic cancer Brother   . Diabetes Brother   . Diabetes Daughter        medication induced for cancer treatments  . Cancer Daughter        breast, brain     No Known Allergies   REVIEW OF SYSTEMS(Negative unless checked)  Constitutional: _0 ?Weight loss_1 ?Fever_2 ?Chills Cardiac:_3 ?Chest pain_4 ?Chest pressure_5 ?Palpitations _6 ?Shortness of breath when laying flat _7 ?Shortness of breath at rest _8 ?Shortness of breath with exertion. Vascular: _9 ?Pain in legs with walking_10 ?Pain in legsat rest_11 ?Pain in legs when laying flat _12 ?Claudication _13 ?Pain in feet when walking _14 ?Pain in feet at rest _15 ?Pain in feet when laying flat _16 ?History of DVT _17 ?Phlebitis _18 ?Swelling in legs _19 ?Varicose veins _20 ?Non-healing ulcers Pulmonary: _21 ?Uses home oxygen _22 ?Productive cough_23 ?Hemoptysis _24 ?Wheeze _25 ?COPD _26 ?Asthma Neurologic: _27 ?Dizziness _28 ?Blackouts _29 ?Seizures _30 ?History of stroke _31 ?History of TIA_32 ?Aphasia _33 ?Temporary blindness_34 ?Dysphagia _35 ?Weaknessor numbness in arms _36 ?Weakness or numbnessin legs Musculoskeletal: _37 ?Arthritis _38 ?Joint swelling _39 ?Joint pain _40 ?Low back pain  Hematologic:_41 ?Easy bruising_42 ?Easy bleeding _43 ?Hypercoagulable state _44 ?Anemic  Gastrointestinal:_45 ?Blood in stool_46 ?Vomiting blood_47 ?Gastroesophageal reflux/heartburn_48 ?Abdominal pain Genitourinary: _49 ?Chronic kidney disease _50 ?Difficulturination _51 ?Frequenturination _52 ?Burning with urination_53 ?Hematuria Skin: _54 ?Rashes _55 ?Ulcers _56 ?Wounds Psychological: _57 ?History of anxiety_58 ?History of major depression.  Physical Examination  BP 107/68 (BP Location: Right Arm)   Pulse 85   Resp 16   Wt 187 lb 6.4 oz (85 kg)   BMI 26.14 kg/m  Gen:  WD/WN, NAD Head: [place]/AT, No temporalis wasting. Ear/Nose/Throat: Hearing grossly intact, nares w/o erythema or drainage Eyes: Conjunctiva clear. Sclera non-icteric Neck: Supple.  Trachea midline Pulmonary:  Good air movement, no use of accessory muscles.  Cardiac: RRR, no JVD Vascular:  Vessel Right Left  Radial Palpable Palpable                          PT Palpable Palpable  DP Palpable Palpable   Gastrointestinal: soft, non-tender/non-distended.  Musculoskeletal: M/S 5/5 throughout.  No deformity or atrophy. Trace LE edema. Neurologic: Sensation grossly intact in extremities.  Symmetrical.  Speech is fluent.  Psychiatric: Judgment intact, Mood & affect appropriate for pt's clinical situation. Dermatologic: No rashes or ulcers noted.  No cellulitis or open wounds.       Labs Recent Results (from the past 2160 hour(s))  Kappa/lambda light chains     Status: Abnormal   Collection Time: 12/18/18  1:44 PM  Result Value Ref Range   Kappa free light chain 138.4 (H) 3.3 - 19.4 mg/L   Lamda free light chains 40.3 (H) 5.7 - 26.3 mg/L   Kappa, lamda light chain ratio 3.43 (H) 0.26 - 1.65    Comment: (NOTE) Performed At: [facility] [place], [place] [id number] Rush Farmer MD WU:[id number]   Multiple Myeloma Panel (SPEP&IFE w/QIG)     Status: Abnormal   Collection Time:  12/18/18  1:44 PM  Result Value Ref Range  IgG (Immunoglobin G), Serum 2,116 (H) 603 - 1,613 mg/dL   IgA 251 61 - 437 mg/dL   IgM (Immunoglobulin M), Srm 88 15 - 143 mg/dL   Total Protein ELP 7.1 6.0 - 8.5 g/dL   Albumin SerPl Elph-Mcnc 3.1 2.9 - 4.4 g/dL   Alpha 1 0.2 0.0 - 0.4 g/dL   Alpha2 Glob SerPl Elph-Mcnc 0.9 0.4 - 1.0 g/dL   B-Globulin SerPl Elph-Mcnc 1.0 0.7 - 1.3 g/dL   Gamma Glob SerPl Elph-Mcnc 1.8 0.4 - 1.8 g/dL   M Protein SerPl Elph-Mcnc Not Observed Not Observed g/dL   Globulin, Total 4.0 (H) 2.2 - 3.9 g/dL   Albumin/Glob SerPl 0.8 0.7 - 1.7   IFE 1 Comment     Comment: Polyclonal increase detected in one or more immunoglobulins.   Please Note Comment     Comment: (NOTE) Protein electrophoresis scan will follow via computer, mail, or courier delivery. Performed At: [facility] [place], [place] [id number] Rush Farmer MD WU:[id number]   Ferritin     Status: Abnormal   Collection Time: 12/18/18  1:44 PM  Result Value Ref Range   Ferritin 16 (L) 24 - 336 ng/mL    Comment: Performed at [facility] [address] [place], [place] [address]  Iron and TIBC     Status: Abnormal   Collection Time: 12/18/18  1:44 PM  Result Value Ref Range   Iron 17 (L) 45 - 182 ug/dL   TIBC 356 250 - 450 ug/dL   Saturation Ratios 5 (L) 17.9 - 39.5 %   UIBC 339 ug/dL    Comment: Performed at [facility], [address] [place], [place] [address]  Comprehensive metabolic panel     Status: Abnormal   Collection Time: 12/18/18  1:44 PM  Result Value Ref Range   Sodium 138 135 - 145 mmol/L   Potassium 4.3 3.5 - 5.1 mmol/L   Chloride 111 98 - 111 mmol/L   CO2 23 22 - 32 mmol/L   Glucose, Bld 95 70 - 99 mg/dL   BUN 22 8 - 23 mg/dL   Creatinine, Ser 1.49 (H) 0.61 - 1.24 mg/dL   Calcium 8.7 (L) 8.9 - 10.3 mg/dL   Total Protein 8.1 6.5 - 8.1 g/dL   Albumin 3.2 (L) 3.5 - 5.0 g/dL   AST 14 (L) 15 - 41 U/L   ALT 14 0 - 44 U/L    Alkaline Phosphatase 58 38 - 126 U/L   Total Bilirubin 0.4 0.3 - 1.2 mg/dL   GFR calc non Af Amer 45 (L) >60 mL/min   GFR calc Af Amer 52 (L) >60 mL/min   Anion gap 4 (L) 5 - 15    Comment: Performed at [facility], [address] [place], [place] [address]  CBC with Differential/Platelet     Status: Abnormal   Collection Time: 12/18/18  1:44 PM  Result Value Ref Range   WBC 10.4 4.0 - 10.5 K/uL   RBC 4.33 4.22 - 5.81 MIL/uL   Hemoglobin 9.8 (L) 13.0 - 17.0 g/dL    Comment: Reticulocyte Hemoglobin testing may be clinically indicated, consider ordering this additional test [id number]    HCT 32.5 (L) 39.0 - 52.0 %   MCV 75.1 (L) 80.0 - 100.0 fL   MCH 22.6 (L) 26.0 - 34.0 pg   MCHC 30.2 30.0 - 36.0 g/dL   RDW 17.9 (H) 11.5 - 15.5 %   Platelets 296 150 - 400  K/uL   nRBC 0.0 0.0 - 0.2 %   Neutrophils Relative % 73 %   Neutro Abs 7.7 1.7 - 7.7 K/uL   Lymphocytes Relative 12 %   Lymphs Abs 1.2 0.7 - 4.0 K/uL   Monocytes Relative 8 %   Monocytes Absolute 0.8 0.1 - 1.0 K/uL   Eosinophils Relative 6 %   Eosinophils Absolute 0.6 (H) 0.0 - 0.5 K/uL   Basophils Relative 0 %   Basophils Absolute 0.0 0.0 - 0.1 K/uL   Immature Granulocytes 1 %   Abs Immature Granulocytes 0.07 0.00 - 0.07 K/uL    Comment: Performed at [facility], [address] [place], [place] [address]    Radiology No results found.  Assessment/Plan BP (high blood pressure) blood pressure control important in reducing the progression of atherosclerotic disease. On appropriate oral medications.   HLD (hyperlipidemia) lipid control important in reducing the progression of atherosclerotic disease. Continue statin therapy   Tobacco use disorder Pur[place] Nails discussed this many times and he understands it is deleterious to his vascular system but he really has no interest in quitting.  No problem-specific Assessment & Plan notes found for this encounter.    Leotis Pain, MD  12/29/2018 12:18 PM     This note was created with Dragon medical transcription system.  Any errors from dictation are purely unintentional

## 2019-01-01 ENCOUNTER — Other Ambulatory Visit: Payer: Self-pay

## 2019-01-01 ENCOUNTER — Inpatient Hospital Stay: Payer: Medicare Other

## 2019-01-01 VITALS — BP 96/60 | HR 69 | Temp 96.8°F | Resp 18

## 2019-01-01 DIAGNOSIS — D509 Iron deficiency anemia, unspecified: Secondary | ICD-10-CM

## 2019-01-01 DIAGNOSIS — E1122 Type 2 diabetes mellitus with diabetic chronic kidney disease: Secondary | ICD-10-CM | POA: Diagnosis not present

## 2019-01-01 MED ORDER — IRON SUCROSE 20 MG/ML IV SOLN
200.0000 mg | Freq: Once | INTRAVENOUS | Status: AC
  Administered 2019-01-01: 200 mg via INTRAVENOUS
  Filled 2019-01-01: qty 10

## 2019-01-01 MED ORDER — SODIUM CHLORIDE 0.9 % IV SOLN
INTRAVENOUS | Status: DC
  Administered 2019-01-01: 14:00:00 via INTRAVENOUS
  Filled 2019-01-01: qty 250

## 2019-01-02 ENCOUNTER — Encounter: Payer: Self-pay | Admitting: Gastroenterology

## 2019-01-02 ENCOUNTER — Other Ambulatory Visit: Payer: Self-pay

## 2019-01-02 ENCOUNTER — Ambulatory Visit (INDEPENDENT_AMBULATORY_CARE_PROVIDER_SITE_OTHER): Payer: Medicare Other | Admitting: Gastroenterology

## 2019-01-02 VITALS — BP 132/82 | HR 67 | Temp 99.0°F | Ht 71.0 in | Wt 186.6 lb

## 2019-01-02 DIAGNOSIS — D500 Iron deficiency anemia secondary to blood loss (chronic): Secondary | ICD-10-CM

## 2019-01-02 NOTE — Progress Notes (Signed)
[name] MD, MRCP(U.K) [address]  [place]  [place], [place] [address]  Main: [phone]  Fax: [phone]   Primary Care Physician: Jodi Marble, MD  Primary Gastroenterologist:  Dr. [name]   Iron deficiency anemia  HPI: Omar [name] is a 78 y.o. male    Summary of history :   He has been initially referred by Dr. Tasia Catchings for evaluation of iron deficiency anemia.  He has a history of multiple myeloma and has actually been referred to [facility] for evaluation of autologous hematopoietic cell transplantation.   He has had a right colectomy in 2017 the polyp with high-grade dysplasia in the ascending colon, has had rectal AVMs treated with APC in 2017.  He was hospitalized in 2018 with melena.   I performed a colonoscopy in May 2018 nonbleeding internal hemorrhoids were seen.  Multiple medium-sized AVMs were found in the rectum nonbleeding and the appearance suggestive of radiation proctitis very close to the anal verge.  3 sessile polyps resected.  He underwent an upper endoscopy in May 2018 by myself and noted gastritis duodenitis LA grade a esophagitis, benign stricture/Schatzki's ring was seen which was not dilated as he did not have any dysphagia.  At that point of time I placed him on Prilosec 40 mg for 8 weeks. He does have CKD.   Interval history  06/28/2017-01/02/2019  07/25/2017: Capsule study of small bowel: Multiple bleeding and nonbleeding AVMs of the small bowel was seen.  Subsequently referred to [facility] and underwent balloon enteroscopy anterograde and had multiple AVMs in the duodenum and jejunum mid jejunum ablated.  Distal aspect reached was tattooed.  If there were to be further bleeding advised to repeat CBC.  4 months back his hemoglobin was 13.9 g subsequently 3 months back his hemoglobin dropped to 12.9 g and 2 weeks back hemoglobin dropped to 9.8.  Similarly the MCV has dropped from 81.6-75.1, ferritin of 16 from a normal ferritin of year  back.  Denies any black-colored stools.  Denies any overt bleeding.  States he is feeling tired.  He is taking Eliquis.   Current Outpatient Medications  Medication Sig Dispense Refill  . acetaminophen (TYLENOL) 325 MG tablet Take 325 mg by mouth every 6 (six) hours as needed.    Marland Kitchen albuterol (PROVENTIL HFA;VENTOLIN HFA) 108 (90 Base) MCG/ACT inhaler     . ALPRAZolam (XANAX) 0.25 MG tablet Take by mouth 2 (two) times daily as needed.     Marland Kitchen amLODipine-benazepril (LOTREL) 5-20 MG capsule Take 1 capsule by mouth daily.    Marland Kitchen aspirin 81 MG chewable tablet Chew 81 mg by mouth daily.    Marland Kitchen atorvastatin (LIPITOR) 80 MG tablet Take 80 mg by mouth at bedtime.     . benazepril (LOTENSIN) 20 MG tablet Take 20 mg by mouth daily.    . benzonatate (TESSALON) 100 MG capsule Take 100 mg by mouth 3 (three) times daily.    . Butalbital-APAP-Caffeine (FIORICET) 50-300-40 MG CAPS Take 1 capsule every four to six hours as needed    . Cholecalciferol (D3-50) 50000 units capsule Take 50,000 Units by mouth once a week.     . clopidogrel (PLAVIX) 75 MG tablet Take by mouth.    . colchicine 0.6 MG tablet     . ELIQUIS 2.5 MG TABS tablet     . fexofenadine (ALLEGRA) 180 MG tablet Take 180 mg by mouth daily.    . fluticasone (FLONASE) 50 MCG/ACT nasal spray Place 2 sprays into both  nostrils daily as needed for rhinitis.     . Fluticasone Furoate-Vilanterol (BREO ELLIPTA) 100-25 MCG/INH AEPB Inhale 1 puff into the lungs daily.     . folic acid (FOLVITE) 1 MG tablet Take by mouth.    . furosemide (LASIX) 20 MG tablet Take 20 mg by mouth daily.    Marland Kitchen gabapentin (NEURONTIN) 300 MG capsule Take 300 mg by mouth 3 (three) times daily.  2  . gabapentin (NEURONTIN) 400 MG capsule Take 400 mg by mouth 3 (three) times daily.    Marland Kitchen glucose blood (ONE TOUCH ULTRA TEST) test strip     . hydroxychloroquine (PLAQUENIL) 200 MG tablet Take 400 mg by mouth daily.  2  . isosorbide mononitrate (IMDUR) 30 MG 24 hr tablet Take 30 mg by mouth  daily.    Marland Kitchen loperamide (IMODIUM) 2 MG capsule Take 1 capsule (2 mg total) by mouth See admin instructions. With onset of diarrhea, take 40m followed by 239mevery 2 hours resolved. Maximum: 16 mg/day (Patient not taking: Reported on 12/29/2018) 120 capsule 0  . metFORMIN (GLUCOPHAGE) 500 MG tablet Take 500 mg by mouth daily with breakfast.     . metoprolol succinate (TOPROL-XL) 25 MG 24 hr tablet Take 25 mg by mouth daily.    . ondansetron (ZOFRAN) 8 MG tablet Take 1 tablet (8 mg total) 2 (two) times daily as needed by mouth (Nausea or vomiting). (Patient not taking: Reported on 12/29/2018) 30 tablet 1  . pantoprazole (PROTONIX) 20 MG tablet Take 20 mg by mouth daily.    . pantoprazole (PROTONIX) 40 MG tablet Take 1 tablet (40 mg total) by mouth 2 (two) times daily. (Patient not taking: Reported on 12/29/2018) 60 tablet 3  . traMADol-acetaminophen (ULTRACET) 37.5-325 MG tablet     . triamcinolone cream (KENALOG) 0.1 % Apply 1 application topically 2 (two) times daily.     No current facility-administered medications for this visit.     Allergies as of 01/02/2019  . (No Known Allergies)    ROS:  General: Negative for anorexia, weight loss, fever, chills, fatigue, weakness. ENT: Negative for hoarseness, difficulty swallowing , nasal congestion. CV: Negative for chest pain, angina, palpitations, dyspnea on exertion, peripheral edema.  Respiratory: Negative for dyspnea at rest, dyspnea on exertion, cough, sputum, wheezing.  GI: See history of present illness. GU:  Negative for dysuria, hematuria, urinary incontinence, urinary frequency, nocturnal urination.  Endo: Negative for unusual weight change.    Physical Examination:   There were no vitals taken for this visit.  General: Well-nourished, well-developed in no acute distress.  Eyes: No icterus. Conjunctivae pink. Mouth: Oropharyngeal mucosa moist and pink , no lesions erythema or exudate. Lungs: Clear to auscultation bilaterally.  Non-labored. Heart: Regular rate and rhythm, no murmurs rubs or gallops.  Abdomen: Bowel sounds are normal, nontender, nondistended, no hepatosplenomegaly or masses, no abdominal bruits or hernia , no rebound or guarding.   Extremities: No lower extremity edema. No clubbing or deformities. Neuro: Alert and oriented x 3.  Grossly intact. Skin: Warm and dry, no jaundice.   Psych: Alert and cooperative, normal mood and affect.   Imaging Studies: Vas UsKoreavar Duplex  Result Date: 12/29/2018 Endovascular Aortic Repair Study (EVAR) Indications: Follow up exam for EVAR.  Comparison Study: 12/23/2017 Performing Technologist: SoAlmira CoasterVS  Examination Guidelines: A complete evaluation includes B-mode imaging, spectral Doppler, color Doppler, and power Doppler as needed of all accessible portions of each vessel. Bilateral testing is considered an integral part of a  complete examination. Limited examinations for reoccurring indications may be performed as noted.  Endovascular Aortic Repair (EVAR): +----------+----------------+-------------------+-------------------+           Diameter AP (cm)Diameter Trans (cm)Velocities (cm/sec) +----------+----------------+-------------------+-------------------+ Aorta     3.91            4.20               39                  +----------+----------------+-------------------+-------------------+ Right Limb1.16            1.28               29                  +----------+----------------+-------------------+-------------------+ Left Limb 1.33            1.23               42                  +----------+----------------+-------------------+-------------------+ +-------------+-------------------------------------------+ Endoleak TypeThere appears to be no evidence of Endoleak +-------------+-------------------------------------------+  Summary: Abdominal Aorta: There is evidence of abnormal dilitation of the Distal Abdominal aorta. Patent endovascular  aneurysm repair with no evidence of endoleak. The largest aortic diameter has increased compared to prior exam. Previous diameter measurement was  3.9 cm obtained on 12/23/2017.  *See table(s) above for measurements and observations.  Electronically signed by Leotis Pain MD on 12/29/2018 at 12:32:38 PM.   Final     Assessment and Plan:   Omar [name] is a 78 y.o. y/o male  here to see me back for iron deficiency anemia. H/o multple myeloma, radiation proctitis, CKD.  Hemoglobin was stable previously.  Recently has dropped.  He has had multiple small bowel AVMs that were ablated by an anterograde enteroscopy performed at [facility]  1. Urine analysis 2. Capsule study of the small bowel to r/o bleeding from AVM's of the small bowel.  Since the distal aspect of the small bowel reached was tattooed during the anterior grade enteroscopy.  We can determine if there are further bleeding AVMs in relation to this tattoo and then decide whether he would require anterograde or retrograde enteroscopy. 3.  Continue monitoring of CBC and iron infusions per Dr. Tasia Catchings with hematology  Risks, benefits, alternatives of Givens capsule discussed with patient to include but not limited to the rare risk of Given's capsule becoming lodged in the GI tract requiring surgical removal.  The patient agrees with this plan & consent will be obtained.   Dr [name]  MD,MRCP [facility]) Follow up in 4 to 6 weeks

## 2019-01-03 LAB — URINALYSIS
Bilirubin, UA: NEGATIVE
Glucose, UA: NEGATIVE
Ketones, UA: NEGATIVE
Leukocytes,UA: NEGATIVE
Nitrite, UA: NEGATIVE
RBC, UA: NEGATIVE
Specific Gravity, UA: 1.023 (ref 1.005–1.030)
Urobilinogen, Ur: 0.2 mg/dL (ref 0.2–1.0)
pH, UA: 5 (ref 5.0–7.5)

## 2019-01-05 ENCOUNTER — Other Ambulatory Visit: Payer: Self-pay

## 2019-01-08 ENCOUNTER — Other Ambulatory Visit: Payer: Self-pay

## 2019-01-08 ENCOUNTER — Inpatient Hospital Stay: Payer: Medicare Other

## 2019-01-08 ENCOUNTER — Telehealth: Payer: Self-pay | Admitting: *Deleted

## 2019-01-08 NOTE — Telephone Encounter (Signed)
Patient came in the office today for iron infusion.  Patient answered yes to SOB & abdominal pain during COVID screening at the front desk. Patient was placed in the negative pressure room. Talked with patient, he states that his abdominal pain could be coming from not eating today. Per Dr Tasia Catchings, okay to cancel todays iron infusion and r/s. Advised pt to keep next weeks iron infusion appt and call PCP if he beings to have any other or worsening symptoms.

## 2019-01-15 ENCOUNTER — Ambulatory Visit: Payer: Medicare Other

## 2019-01-15 ENCOUNTER — Other Ambulatory Visit: Payer: Self-pay

## 2019-01-16 ENCOUNTER — Other Ambulatory Visit: Payer: Self-pay

## 2019-01-16 ENCOUNTER — Inpatient Hospital Stay: Payer: Medicare Other | Attending: Oncology

## 2019-01-16 VITALS — BP 101/63 | HR 73 | Temp 97.1°F | Resp 18

## 2019-01-16 DIAGNOSIS — D631 Anemia in chronic kidney disease: Secondary | ICD-10-CM | POA: Diagnosis not present

## 2019-01-16 DIAGNOSIS — Z8249 Family history of ischemic heart disease and other diseases of the circulatory system: Secondary | ICD-10-CM | POA: Insufficient documentation

## 2019-01-16 DIAGNOSIS — Z79899 Other long term (current) drug therapy: Secondary | ICD-10-CM | POA: Insufficient documentation

## 2019-01-16 DIAGNOSIS — Z800 Family history of malignant neoplasm of digestive organs: Secondary | ICD-10-CM | POA: Diagnosis not present

## 2019-01-16 DIAGNOSIS — Z808 Family history of malignant neoplasm of other organs or systems: Secondary | ICD-10-CM | POA: Diagnosis not present

## 2019-01-16 DIAGNOSIS — E611 Iron deficiency: Secondary | ICD-10-CM | POA: Diagnosis not present

## 2019-01-16 DIAGNOSIS — C9030 Solitary plasmacytoma not having achieved remission: Secondary | ICD-10-CM | POA: Diagnosis present

## 2019-01-16 DIAGNOSIS — Z8379 Family history of other diseases of the digestive system: Secondary | ICD-10-CM | POA: Diagnosis not present

## 2019-01-16 DIAGNOSIS — F17210 Nicotine dependence, cigarettes, uncomplicated: Secondary | ICD-10-CM | POA: Diagnosis not present

## 2019-01-16 DIAGNOSIS — M069 Rheumatoid arthritis, unspecified: Secondary | ICD-10-CM | POA: Diagnosis not present

## 2019-01-16 DIAGNOSIS — Z803 Family history of malignant neoplasm of breast: Secondary | ICD-10-CM | POA: Insufficient documentation

## 2019-01-16 DIAGNOSIS — N183 Chronic kidney disease, stage 3 (moderate): Secondary | ICD-10-CM | POA: Insufficient documentation

## 2019-01-16 DIAGNOSIS — D509 Iron deficiency anemia, unspecified: Secondary | ICD-10-CM

## 2019-01-16 DIAGNOSIS — Z7289 Other problems related to lifestyle: Secondary | ICD-10-CM | POA: Insufficient documentation

## 2019-01-16 MED ORDER — IRON SUCROSE 20 MG/ML IV SOLN
200.0000 mg | Freq: Once | INTRAVENOUS | Status: AC
  Administered 2019-01-16: 200 mg via INTRAVENOUS
  Filled 2019-01-16: qty 10

## 2019-01-16 MED ORDER — SODIUM CHLORIDE 0.9 % IV SOLN
INTRAVENOUS | Status: DC
  Administered 2019-01-16: 13:00:00 via INTRAVENOUS
  Filled 2019-01-16: qty 250

## 2019-01-19 ENCOUNTER — Other Ambulatory Visit: Payer: Self-pay

## 2019-01-22 ENCOUNTER — Inpatient Hospital Stay: Payer: Medicare Other

## 2019-01-25 ENCOUNTER — Ambulatory Visit (INDEPENDENT_AMBULATORY_CARE_PROVIDER_SITE_OTHER): Payer: Medicare Other | Admitting: Podiatry

## 2019-01-25 ENCOUNTER — Encounter: Payer: Self-pay | Admitting: Podiatry

## 2019-01-25 ENCOUNTER — Other Ambulatory Visit: Payer: Self-pay

## 2019-01-25 VITALS — Temp 97.5°F

## 2019-01-25 DIAGNOSIS — M79609 Pain in unspecified limb: Secondary | ICD-10-CM

## 2019-01-25 DIAGNOSIS — I739 Peripheral vascular disease, unspecified: Secondary | ICD-10-CM

## 2019-01-25 DIAGNOSIS — D689 Coagulation defect, unspecified: Secondary | ICD-10-CM | POA: Diagnosis not present

## 2019-01-25 DIAGNOSIS — B351 Tinea unguium: Secondary | ICD-10-CM

## 2019-01-25 DIAGNOSIS — M2011 Hallux valgus (acquired), right foot: Secondary | ICD-10-CM

## 2019-01-25 DIAGNOSIS — M2012 Hallux valgus (acquired), left foot: Secondary | ICD-10-CM

## 2019-01-25 NOTE — Progress Notes (Signed)
Complaint:  Visit Type: Patient returns to my office for continued preventative foot care services. Complaint: Patient states" my nails have grown long and thick and become painful to walk and wear shoes" Patient has been diagnosed with DM with pvd and neuropathy.. The patient presents for preventative foot care services. No changes to ROS.    Podiatric Exam: Vascular: dorsalis pedis are palpable  B/L. and posterior tibial pulses are not  palpable bilateral. Capillary return is immediate. Temperature gradient is WNL. Skin turgor WNL  Sensorium: Diminished  Semmes Weinstein monofilament test.  Nail Exam: Pt has thick disfigured discolored nails with subungual debris noted bilateral entire nail hallux through fifth toenails.   Ulcer Exam: There is no evidence of ulcer or pre-ulcerative changes or infection. Orthopedic Exam: Muscle tone and strength are WNL. No limitations in general ROM. No crepitus or effusions noted. Foot type and digits show no abnormalities. Bony prominences are unremarkable. HAV  B/L and hammer toe  B/L.  Pes planus. Rigid hammer toe second right. Skin: No Porokeratosis. No infection or ulcers.  Clavi second toe right asymptomatic  Diagnosis:  Onychomycosis, , Pain in right toe, pain in left toes,    Treatment & Plan Procedures and Treatment: Consent by patient was obtained for treatment procedures. The patient understood the discussion of treatment and procedures well. All questions were answered thoroughly reviewed. Debridement of mycotic and hypertrophic toenails, 1 through 5 bilateral and clearing of subungual debris. No ulceration, no infection noted. Padding second hammer toe right. Return Visit-Office Procedure: Patient instructed to return to the office for a follow up visit 3 months for continued evaluation and treatment.    Gardiner Barefoot DPM

## 2019-01-29 ENCOUNTER — Encounter
Admission: RE | Disposition: A | Discharge status: Home / Self Care | Payer: Self-pay | Source: Home / Self Care | Attending: Gastroenterology

## 2019-01-29 ENCOUNTER — Ambulatory Visit
Admission: RE | Admit: 2019-01-29 | Discharge: 2019-01-29 | Disposition: A | Discharge status: Home / Self Care | Payer: Medicare Other | Attending: Gastroenterology | Admitting: Gastroenterology

## 2019-01-29 DIAGNOSIS — K5520 Angiodysplasia of colon without hemorrhage: Secondary | ICD-10-CM | POA: Diagnosis not present

## 2019-01-29 DIAGNOSIS — D509 Iron deficiency anemia, unspecified: Secondary | ICD-10-CM | POA: Diagnosis present

## 2019-01-29 HISTORY — PX: GIVENS CAPSULE STUDY: SHX5432

## 2019-01-29 SURGERY — IMAGING PROCEDURE, GI TRACT, INTRALUMINAL, VIA CAPSULE

## 2019-01-30 ENCOUNTER — Encounter: Payer: Self-pay | Admitting: Gastroenterology

## 2019-02-15 ENCOUNTER — Ambulatory Visit (INDEPENDENT_AMBULATORY_CARE_PROVIDER_SITE_OTHER): Payer: Medicare Other | Admitting: Gastroenterology

## 2019-02-15 ENCOUNTER — Other Ambulatory Visit: Payer: Self-pay

## 2019-02-15 VITALS — BP 97/65 | HR 77 | Temp 98.3°F | Ht 71.0 in | Wt 184.4 lb

## 2019-02-15 DIAGNOSIS — D500 Iron deficiency anemia secondary to blood loss (chronic): Secondary | ICD-10-CM

## 2019-02-15 DIAGNOSIS — K5520 Angiodysplasia of colon without hemorrhage: Secondary | ICD-10-CM | POA: Diagnosis not present

## 2019-02-15 NOTE — Progress Notes (Signed)
[name] MD, MRCP(U.K) [address]  [place]  [place], [place] [address]  Main: [phone]  Fax: [phone]   Primary Care Physician: Jodi Marble, MD  Primary Gastroenterologist:  Dr. [name]   Iron deficiency anemia follow-up  HPI: Omar [name] is a 78 y.o. male    Summary of history : He has a history of iron deficiency anemia He has a history of multiple myeloma,right colectomy in 2017 the polyp with high-grade dysplasia in the ascending colon, has had rectal AVMs treated with APC in 2017. He was hospitalized in 2018 with melena.  I performed a colonoscopy in May 2018 nonbleeding internal hemorrhoids were seen. Multiple medium-sized AVMs were found in the rectum nonbleeding and the appearance suggestive of radiation proctitis very close to the anal verge. 3 sessile polyps resected. He underwent an upper endoscopy in May 2018 by myself and noted gastritis duodenitis LA grade a esophagitis, benign stricture/Schatzki's ring was seen which was not dilated as he did not have any dysphagia. At that point of time I placed him on Prilosec 40 mg for 8 weeks. He does have CKD.  07/25/2017: Capsule study of small bowel: Multiple bleeding and nonbleeding AVMs of the small bowel was seen.  Subsequently referred to [facility] and underwent balloon enteroscopy anterograde and had multiple AVMs in the duodenum and jejunum mid jejunum ablated.  Distal aspect reached was tattooed. Subsequently he has hemoglobin was stable for a few months and dropped to 9.8 g with a ferritin of 16.  Ferritin was normal a year prior.  There is no evidence of overt bleeding in the form of melena or dark-colored stools.  He has been taking his Eliquis.  Interval history7/28/2020-02/15/2019  01/02/2019: Urine analysis: No blood CBC Latest Ref Rng & Units 12/18/2018 09/22/2018 08/29/2018  WBC 4.0 - 10.5 K/uL 10.4 8.3 14.1(H)  Hemoglobin 13.0 - 17.0 g/dL 9.8(L) 12.9(L) 13.9  Hematocrit 39.0 -  52.0 % 32.5(L) 41.4 43.9  Platelets 150 - 400 K/uL 296 260 234   02/15/2019: Capsule study of the small bowel shows few nonbleeding AVMs in the mid jejunum which is proximal to the area of the tattoo.  Current Outpatient Medications  Medication Sig Dispense Refill  . acetaminophen (TYLENOL) 325 MG tablet Take 325 mg by mouth every 6 (six) hours as needed.    Marland Kitchen albuterol (PROVENTIL HFA;VENTOLIN HFA) 108 (90 Base) MCG/ACT inhaler     . ALPRAZolam (XANAX) 0.25 MG tablet Take by mouth 2 (two) times daily as needed.     Marland Kitchen amLODipine-benazepril (LOTREL) 5-20 MG capsule Take 1 capsule by mouth daily.    Marland Kitchen aspirin 81 MG chewable tablet Chew 81 mg by mouth daily.    Marland Kitchen atorvastatin (LIPITOR) 80 MG tablet Take 80 mg by mouth at bedtime.     . benazepril (LOTENSIN) 20 MG tablet Take 20 mg by mouth daily.    . benzonatate (TESSALON) 100 MG capsule Take 100 mg by mouth 3 (three) times daily.    . Butalbital-APAP-Caffeine (FIORICET) 50-300-40 MG CAPS Take 1 capsule every four to six hours as needed    . Cholecalciferol (D3-50) 50000 units capsule Take 50,000 Units by mouth once a week.     . clopidogrel (PLAVIX) 75 MG tablet Take by mouth.    . colchicine 0.6 MG tablet     . ELIQUIS 2.5 MG TABS tablet     . fexofenadine (ALLEGRA) 180 MG tablet Take 180 mg by mouth daily.    Marland Kitchen  fluticasone (FLONASE) 50 MCG/ACT nasal spray Place 2 sprays into both nostrils daily as needed for rhinitis.     . Fluticasone Furoate-Vilanterol (BREO ELLIPTA) 100-25 MCG/INH AEPB Inhale 1 puff into the lungs daily.     . folic acid (FOLVITE) 1 MG tablet Take by mouth.    . furosemide (LASIX) 20 MG tablet Take 20 mg by mouth daily.    Marland Kitchen gabapentin (NEURONTIN) 300 MG capsule Take 300 mg by mouth 3 (three) times daily.  2  . gabapentin (NEURONTIN) 400 MG capsule Take 400 mg by mouth 3 (three) times daily.    Marland Kitchen glucose blood (ONE TOUCH ULTRA TEST) test strip     . hydroxychloroquine (PLAQUENIL) 200 MG tablet Take 400 mg by mouth  daily.  2  . isosorbide mononitrate (IMDUR) 30 MG 24 hr tablet Take 30 mg by mouth daily.    Marland Kitchen loperamide (IMODIUM) 2 MG capsule Take 1 capsule (2 mg total) by mouth See admin instructions. With onset of diarrhea, take 29m followed by 264mevery 2 hours resolved. Maximum: 16 mg/day 120 capsule 0  . metFORMIN (GLUCOPHAGE) 500 MG tablet Take 500 mg by mouth daily with breakfast.     . metoprolol succinate (TOPROL-XL) 25 MG 24 hr tablet Take 25 mg by mouth daily.    . ondansetron (ZOFRAN) 8 MG tablet Take 1 tablet (8 mg total) 2 (two) times daily as needed by mouth (Nausea or vomiting). 30 tablet 1  . pantoprazole (PROTONIX) 20 MG tablet Take 20 mg by mouth daily.    . pantoprazole (PROTONIX) 40 MG tablet Take 1 tablet (40 mg total) by mouth 2 (two) times daily. 60 tablet 3  . tiZANidine (ZANAFLEX) 2 MG tablet Take 2 mg by mouth 3 (three) times daily.    . traMADol-acetaminophen (ULTRACET) 37.5-325 MG tablet     . triamcinolone cream (KENALOG) 0.1 % Apply 1 application topically 2 (two) times daily.     No current facility-administered medications for this visit.     Allergies as of 02/15/2019  . (No Known Allergies)    ROS:  General: Negative for anorexia, weight loss, fever, chills, fatigue, weakness. ENT: Negative for hoarseness, difficulty swallowing , nasal congestion. CV: Negative for chest pain, angina, palpitations, dyspnea on exertion, peripheral edema.  Respiratory: Negative for dyspnea at rest, dyspnea on exertion, cough, sputum, wheezing.  GI: See history of present illness. GU:  Negative for dysuria, hematuria, urinary incontinence, urinary frequency, nocturnal urination.  Endo: Negative for unusual weight change.    Physical Examination:   There were no vitals taken for this visit.  General: Well-nourished, well-developed in no acute distress.  Eyes: No icterus. Conjunctivae pink. Mouth: Oropharyngeal mucosa moist and pink , no lesions erythema or exudate. Lungs: Clear  to auscultation bilaterally. Non-labored. Heart: Regular rate and rhythm, no murmurs rubs or gallops.  Abdomen: Bowel sounds are normal, nontender, nondistended, no hepatosplenomegaly or masses, no abdominal bruits or hernia , no rebound or guarding.   Extremities: No lower extremity edema. No clubbing or deformities. Neuro: Alert and oriented x 3.  Grossly intact. Skin: Warm and dry, no jaundice.   Psych: Alert and cooperative, normal mood and affect.   Imaging Studies: No results found.  Assessment and Plan:   Omar SCHNEPFs a [age].o. y/o male here to see me back for iron deficiency anemia. H/o multple myeloma, radiation proctitis, CKD.  Hemoglobin was stable previously.  Recently has dropped.  He has had multiple small bowel AVMs that  were ablated by an anterograde enteroscopy performed at [facility].  Repeat capsule study further shows nonbleeding medium to large size AVMs in the mid jejunum proximal to the area that was previously tattooed on deep enteroscopy  Plan  1.  Continue monitoring of CBC and iron infusions per Dr. Tasia Catchings with hematology 2.  Refer back to [facility] for anterograde enteroscopy to ablate AVMs seen on capsule study of the small bowel  Dr [name]  MD,MRCP [facility]) Follow up in 8 weeks

## 2019-02-16 LAB — IRON,TIBC AND FERRITIN PANEL
Ferritin: 18 ng/mL — ABNORMAL LOW (ref 30–400)
Iron Saturation: 4 % — CL (ref 15–55)
Iron: 14 ug/dL — ABNORMAL LOW (ref 38–169)
Total Iron Binding Capacity: 329 ug/dL (ref 250–450)
UIBC: 315 ug/dL (ref 111–343)

## 2019-02-16 LAB — CBC WITH DIFFERENTIAL/PLATELET
Basophils Absolute: 0 10*3/uL (ref 0.0–0.2)
Basos: 0 %
EOS (ABSOLUTE): 0.3 10*3/uL (ref 0.0–0.4)
Eos: 3 %
Hematocrit: 31.3 % — ABNORMAL LOW (ref 37.5–51.0)
Hemoglobin: 9.2 g/dL — ABNORMAL LOW (ref 13.0–17.7)
Immature Grans (Abs): 0 10*3/uL (ref 0.0–0.1)
Immature Granulocytes: 0 %
Lymphocytes Absolute: 1.2 10*3/uL (ref 0.7–3.1)
Lymphs: 16 %
MCH: 21.7 pg — ABNORMAL LOW (ref 26.6–33.0)
MCHC: 29.4 g/dL — ABNORMAL LOW (ref 31.5–35.7)
MCV: 74 fL — ABNORMAL LOW (ref 79–97)
Monocytes Absolute: 0.6 10*3/uL (ref 0.1–0.9)
Monocytes: 7 %
Neutrophils Absolute: 5.6 10*3/uL (ref 1.4–7.0)
Neutrophils: 74 %
Platelets: 277 10*3/uL (ref 150–450)
RBC: 4.24 x10E6/uL (ref 4.14–5.80)
RDW: 19.9 % — ABNORMAL HIGH (ref 11.6–15.4)
WBC: 7.7 10*3/uL (ref 3.4–10.8)

## 2019-02-19 ENCOUNTER — Inpatient Hospital Stay: Payer: Medicare Other | Attending: Oncology | Admitting: Oncology

## 2019-02-19 ENCOUNTER — Other Ambulatory Visit: Payer: Self-pay

## 2019-02-19 ENCOUNTER — Encounter: Payer: Self-pay | Admitting: Oncology

## 2019-02-19 ENCOUNTER — Inpatient Hospital Stay: Payer: Medicare Other

## 2019-02-19 VITALS — BP 108/72 | HR 88 | Temp 98.1°F | Ht 71.0 in | Wt 184.0 lb

## 2019-02-19 DIAGNOSIS — Z7951 Long term (current) use of inhaled steroids: Secondary | ICD-10-CM | POA: Diagnosis not present

## 2019-02-19 DIAGNOSIS — R5383 Other fatigue: Secondary | ICD-10-CM | POA: Diagnosis not present

## 2019-02-19 DIAGNOSIS — I4891 Unspecified atrial fibrillation: Secondary | ICD-10-CM | POA: Diagnosis not present

## 2019-02-19 DIAGNOSIS — Z7902 Long term (current) use of antithrombotics/antiplatelets: Secondary | ICD-10-CM | POA: Insufficient documentation

## 2019-02-19 DIAGNOSIS — Z7901 Long term (current) use of anticoagulants: Secondary | ICD-10-CM | POA: Insufficient documentation

## 2019-02-19 DIAGNOSIS — Z79899 Other long term (current) drug therapy: Secondary | ICD-10-CM | POA: Diagnosis not present

## 2019-02-19 DIAGNOSIS — K5521 Angiodysplasia of colon with hemorrhage: Secondary | ICD-10-CM | POA: Diagnosis not present

## 2019-02-19 DIAGNOSIS — D500 Iron deficiency anemia secondary to blood loss (chronic): Secondary | ICD-10-CM | POA: Insufficient documentation

## 2019-02-19 DIAGNOSIS — N183 Chronic kidney disease, stage 3 (moderate): Secondary | ICD-10-CM | POA: Insufficient documentation

## 2019-02-19 DIAGNOSIS — Z8546 Personal history of malignant neoplasm of prostate: Secondary | ICD-10-CM | POA: Insufficient documentation

## 2019-02-19 DIAGNOSIS — F17210 Nicotine dependence, cigarettes, uncomplicated: Secondary | ICD-10-CM | POA: Insufficient documentation

## 2019-02-19 DIAGNOSIS — Z7984 Long term (current) use of oral hypoglycemic drugs: Secondary | ICD-10-CM | POA: Insufficient documentation

## 2019-02-19 DIAGNOSIS — M069 Rheumatoid arthritis, unspecified: Secondary | ICD-10-CM | POA: Diagnosis not present

## 2019-02-19 DIAGNOSIS — I129 Hypertensive chronic kidney disease with stage 1 through stage 4 chronic kidney disease, or unspecified chronic kidney disease: Secondary | ICD-10-CM | POA: Diagnosis not present

## 2019-02-19 DIAGNOSIS — D631 Anemia in chronic kidney disease: Secondary | ICD-10-CM | POA: Insufficient documentation

## 2019-02-19 DIAGNOSIS — Z7982 Long term (current) use of aspirin: Secondary | ICD-10-CM | POA: Insufficient documentation

## 2019-02-19 DIAGNOSIS — K219 Gastro-esophageal reflux disease without esophagitis: Secondary | ICD-10-CM | POA: Diagnosis not present

## 2019-02-19 DIAGNOSIS — E119 Type 2 diabetes mellitus without complications: Secondary | ICD-10-CM | POA: Insufficient documentation

## 2019-02-19 DIAGNOSIS — E785 Hyperlipidemia, unspecified: Secondary | ICD-10-CM | POA: Diagnosis not present

## 2019-02-19 DIAGNOSIS — C9001 Multiple myeloma in remission: Secondary | ICD-10-CM | POA: Diagnosis present

## 2019-02-19 DIAGNOSIS — D509 Iron deficiency anemia, unspecified: Secondary | ICD-10-CM

## 2019-02-19 MED ORDER — SODIUM CHLORIDE 0.9 % IV SOLN
INTRAVENOUS | Status: DC
  Administered 2019-02-19: 15:00:00 via INTRAVENOUS
  Filled 2019-02-19: qty 250

## 2019-02-19 MED ORDER — IRON SUCROSE 20 MG/ML IV SOLN
200.0000 mg | Freq: Once | INTRAVENOUS | Status: AC
  Administered 2019-02-19: 200 mg via INTRAVENOUS
  Filled 2019-02-19: qty 10

## 2019-02-20 NOTE — Progress Notes (Signed)
Hematology/Oncology Follow Up Note [facility] [phone] [phone]  Patient Care Team: Jodi Marble, MD as PCP - General (Internal Medicine) Bary Castilla, Forest Gleason, MD (General Surgery) Edrick Kins, MD as Rounding Team (Internal Medicine) Hillary Bow, MD as Consulting Physician (Internal Medicine) [name], MD as Surgeon (Gastroenterology) Isaias Cowman, MD as Consulting Physician (Cardiology) Earlie Server, MD as Consulting Physician (Oncology)  REFERRING PROVIDER: Dr.Lateef, [place]  REASON FOR VISIT Follow up for management of plasma cell dyscrasia.  And iron deficiency anemia.   HISTORY OF PRESENTING ILLNESS:  78 y.o.  male with PMH listed below who presents to follow up on the evaluation and management of his abnormal urine protein electrophoresis results. I reviewed the records from [facility], [place] and [place] was performed and HemOnc related medical problems are listed below.  He lives with a friend. He has 3 adult children.   1 Chronic Kidney disease, Stage III: patient follows up with Dr.Lateef.  2 Proteinuria:  02/18/2017: Albumin/Creatinine ratio was 7, Urine protein electrophoresis,Random Urine revealed M Spike 43.2%, total protein of 43.'7mg'$ /dl,  01/05/2017 Albumin/Creatinine ratio was 455.2, Albumin 996.5, , 2.15.2018 Albumin/Creatinine ratio was 80.3, Albumin 73.3, Urine protein electrophoresis,Random Urine revealed M Spike 29.7%, total protein of 29.'1mg'$ /dl, 06/30/2016 Serum protein electrophoresis did not detect M spike.  Autoimmune disorder work up showed Negative anti dsDNA, RNP antibodies, smith antibody, Sjogren antibodies, anti Jo-1, positive antichromotin antibodies, positive ANA,  3 Anemia of chronic kidney disease:  4 Iron deficiency anemia: history of iron deficiency, ferritin was 7 on 08/11/2016. He had EGD and colonoscopy that were done this year  which showed duodenitis/esphagitis/diverticulosis/benigh polyps. # small bowel AVMs was ablated at [facility].   5 Rheumatoid arthritis: he follows up with Dr.Kernodle and is on chronic MTX. He Is currently off MTX reports joint pains are controlled, not quite symptomatic.   Patient denies any persistent bone pain or any pain. He continue to feel lack of energy, persistent, not improved with resting or taking naps. He also has lost 25 pounds in the past year which was unintentional.   # Rheumatoid arthritis and currently is off MTX.  # It is ambiguous whether he has truly symptomatic multiple myeloma or a smoldering myeloma, given that anemia can be secondary to iron deficiency and CKD.  The hypermetabolic 2.1 cm hypermetabolic soft tissue lesion in the left heel, can reflect a Plasmacytoma,which should have been biopsied. However patient initially denies being on any blood thinner, later he was found to be on Plavix with pre biopsy questionnaire screening, and biopsy was held. Patient has to contact cardiologist to hold plavix for seven days before procedure. He feels frustrated about multiple workup and meanwhile he becomes more fatigues, with worsening of kidney function. Patient was reluctant about proceeding additional invasive procedures and wants to start treatment. Decision was made to start active MM treatment.   # He was evaluated by [facility] bone marrow transplant team for autologous bone marrow transplant. He is not considered to  good candidate for transplant. Patient cardiologist Dr. Chancy Milroy had started patient on Lasix 20 mg daily. Lasix was discontinued by [facility] bone marrow transplant team as patient was having dizziness and borderline blood pressure during his clinic visit there.  10/02/2018 He had follow-up left foot/ankle x-ray as a follow-up of his left heel lesion which was initially presented on PET scan and not detectable on follow-up PET scan.  Images were reviewed by me.  No suspicious acute  or subacute osseous abnormality  Current Treatment:  S/p RVD x 4 cycles tolerates well except cytopenia.  Revlimid 10 mg PO once per day on days 1 to 14 (dose reduced due to GFR) Revlimid renal dosing of 24m,  Velcade 1.3 mg/m2 IV once per day on days 1,  8, 15 Dexamethasone 20 mg PO once per day on days 1, 8, 15 given patient's diabetes Given patient's multiple comorbidity and the side effects of cytopenia during his RVD treatment and the uncertainty of this is really a site of plasmacytoma or not, I recommend not to continue him on maintenance Revlimid.   INTERVAL HISTORY Patient presents for follow-up of management of multiple myeloma/smoldering multiple myeloma and iron deficiency anemia. Patient is not currently on any maintenance treatments. He reports feeling tired and fatigued.  Denies any unintentional weight loss, chest pain, abdominal pain, lower extremity pain or swelling. Fatigue with exertion. Denies any shortness of breath. Patient is off methotrexate for rheumatoid arthritis.  On Plaquenil. Appetite is fair. Patient was recently seen by gastroenterology Dr. AVicente MalesPatient had blood work done on 02/15/2019 which showed decrease of hemoglobin to 9.2, MCV 74. .  Patient has repeat small bowel capsule study on 02/16/2019 Which showed multiple AVMs seen in the mid jejunum proximal to the site of prior tattoo.  Dr. AVicente Malesrecommend patient [facility] Dr. AVicente Malesalso notify me about patient's decreased hemoglobin.  Patient's follow-up appointment was moved up to today.  Review of Systems  Constitutional: Positive for fatigue. Negative for appetite change, chills, fever and unexpected weight change.  HENT:   Negative for hearing loss and voice change.   Eyes: Negative for eye problems and icterus.  Respiratory: Negative for chest tightness, cough and shortness of breath.   Cardiovascular: Negative for chest pain and leg  swelling.  Gastrointestinal: Negative for abdominal distention, abdominal pain and diarrhea.  Endocrine: Negative for hot flashes.  Genitourinary: Negative for difficulty urinating, dysuria and frequency.   Musculoskeletal: Negative for arthralgias.  Skin: Negative for itching and rash.  Neurological: Negative for light-headedness and numbness.  Hematological: Negative for adenopathy. Does not bruise/bleed easily.  Psychiatric/Behavioral: Negative for confusion.     MEDICAL HISTORY:  Past Medical History:  Diagnosis Date  . Anxiety   . Arthritis    rheumatoid  . Atrial fibrillation (HCC)    hx of  . Cancer ([place]   . Chronic kidney disease   . Colon polyp 07-07-15   TUBULAR ADENOMA WITH AT LEAST HIGH-GRADE / Dr RRayann Heman . Diabetes mellitus without complication ([place]   . Dysrhythmia    bradycardia....Marland KitchenMarland Kitchen to 1 heart block  . GERD (gastroesophageal reflux disease)   . Heart murmur    patient unaware of history of murmur  . Hypercholesterolemia   . Hypertension   . Presence of permanent cardiac pacemaker   . Prostate cancer ([place] 01/01/13, 01/30/14   Gleason 3+4=7, volume 46.6 cc  . Rheumatoid arthritis ([place]   . S/P radiation therapy  04/03/2014 through 06/04/2014                                                      Prostate 7800 cGy in 40 sessions                          .  Sleep apnea    uses cpap but it is not in the hospital with him    SURGICAL HISTORY: Past Surgical History:  Procedure Laterality Date  . ABDOMINAL AORTIC ANEURYSM REPAIR  11/2013  . COLON SURGERY  March 2017   Right hemicolectomy for tubulovillous adenoma with high-grade dysplasia.  . COLONOSCOPY WITH PROPOFOL N/A 07/07/2015   Procedure: COLONOSCOPY WITH PROPOFOL;  Surgeon: Josefine Class, MD;  Location[facility] END[facility]COPY;  Service: Endoscopy;  Laterality: N/A;  . COLONOSCOPY WITH PROPOFOL N/A 10/28/2016   Procedure: COLONOSCOPY WITH PROPOFOL;  Surgeon: [name], MD;  Location: [facility] ENDOSCOPY;  Service:  Endoscopy;  Laterality: N/A;  . ESOPHAGOGASTRODUODENOSCOPY (EGD) WITH PROPOFOL N/A 10/28/2016   Procedure: ESOPHAGOGASTRODUODENOSCOPY (EGD) WITH PROPOFOL;  Surgeon: [name], MD;  Location: [facility] ENDOSCOPY;  Service: Endoscopy;  Laterality: N/A;  . GIVENS CAPSULE STUDY N/A 07/25/2017   Procedure: GIVENS CAPSULE STUDY;  Surgeon: [name], MD;  Location: [facility] ENDOSCOPY;  Service: Gastroenterology;  Laterality: N/A;  . GIVENS CAPSULE STUDY N/A 01/29/2019   Procedure: GIVENS CAPSULE STUDY;  Surgeon: [name], MD;  Location: [facility] ENDOSCOPY;  Service: Gastroenterology;  Laterality: N/A;  . LAPAROSCOPIC RIGHT COLECTOMY Right 08/08/2015   Procedure: LAPAROSCOPIC RIGHT COLECTOMY;  Surgeon: Robert Bellow, MD;  Location: [facility] ORS;  Service: General;  Laterality: Right;  . NASAL SINUS SURGERY    . PACEMAKER INSERTION Left 08/26/2016   Procedure: INSERTION PACEMAKER;  Surgeon: Isaias Cowman, MD;  Location: [facility] ORS;  Service: Cardiovascular;  Laterality: Left;  . PROSTATE BIOPSY  01/01/13, 01/30/14   Gleason 3+3=6, vol 46.6 cc  . TONSILLECTOMY    . uvula surgery     for sleep apnea    SOCIAL HISTORY: Social History   Socioeconomic History  . Marital status: Divorced    Spouse name: Not on file  . Number of children: Not on file  . Years of education: Not on file  . Highest education level: Not on file  Occupational History  . Not on file  Social Needs  . Financial resource strain: Not on file  . Food insecurity    Worry: Not on file    Inability: Not on file  . Transportation needs    Medical: Not on file    Non-medical: Not on file  Tobacco Use  . Smoking status: Current Every Day Smoker    Packs/day: 1.00    Years: 60.00    Pack years: 60.00    Types: Cigarettes  . Smokeless tobacco: Never Used  . Tobacco comment: DOWN TO 1/2 PPD  Substance and Sexual Activity  . Alcohol use: Yes    Alcohol/week: 1.0 standard drinks    Types: 1 Cans of beer per week    Comment:  occasionally  . Drug use: No  . Sexual activity: Not Currently  Lifestyle  . Physical activity    Days per week: Not on file    Minutes per session: Not on file  . Stress: Not on file  Relationships  . Social Herbalist on phone: Not on file    Gets together: Not on file    Attends religious service: Not on file    Active member of club or organization: Not on file    Attends meetings of clubs or organizations: Not on file    Relationship status: Not on file  . Intimate partner violence    Fear of current or ex partner: Not on file    Emotionally abused: Not on  file    Physically abused: Not on file    Forced sexual activity: Not on file  Other Topics Concern  . Not on file  Social History Narrative  . Not on file    FAMILY HISTORY: Family History  Problem Relation Age of Onset  . Heart attack Mother   . Cirrhosis Father   . Pancreatic cancer Brother   . Diabetes Brother   . Diabetes Daughter        medication induced for cancer treatments  . Cancer Daughter        breast, brain    ALLERGIES:  has No Known Allergies.  MEDICATIONS:  Current Outpatient Medications  Medication Sig Dispense Refill  . acetaminophen (TYLENOL) 325 MG tablet Take 325 mg by mouth every 6 (six) hours as needed.    Marland Kitchen albuterol (PROVENTIL HFA;VENTOLIN HFA) 108 (90 Base) MCG/ACT inhaler     . ALPRAZolam (XANAX) 0.25 MG tablet Take by mouth 2 (two) times daily as needed.     Marland Kitchen amLODipine-benazepril (LOTREL) 5-20 MG capsule Take 1 capsule by mouth daily.    Marland Kitchen aspirin 81 MG chewable tablet Chew 81 mg by mouth daily.    Marland Kitchen atorvastatin (LIPITOR) 80 MG tablet Take 80 mg by mouth at bedtime.     . benazepril (LOTENSIN) 20 MG tablet Take 20 mg by mouth daily.    . benzonatate (TESSALON) 100 MG capsule Take 100 mg by mouth 3 (three) times daily.    . Butalbital-APAP-Caffeine (FIORICET) 50-300-40 MG CAPS Take 1 capsule every four to six hours as needed    . Cholecalciferol (D3-50) 50000 units  capsule Take 50,000 Units by mouth once a week.     . clopidogrel (PLAVIX) 75 MG tablet Take by mouth.    . colchicine 0.6 MG tablet     . ELIQUIS 2.5 MG TABS tablet     . ezetimibe (ZETIA) 10 MG tablet Take 1 tablet by mouth 1 day or 1 dose.    . fexofenadine (ALLEGRA) 180 MG tablet Take 180 mg by mouth daily.    . fluticasone (FLONASE) 50 MCG/ACT nasal spray Place 2 sprays into both nostrils daily as needed for rhinitis.     . Fluticasone Furoate-Vilanterol (BREO ELLIPTA) 100-25 MCG/INH AEPB Inhale 1 puff into the lungs daily.     . folic acid (FOLVITE) 1 MG tablet Take by mouth.    . furosemide (LASIX) 20 MG tablet Take 20 mg by mouth daily.    Marland Kitchen gabapentin (NEURONTIN) 300 MG capsule Take 300 mg by mouth 3 (three) times daily.  2  . gabapentin (NEURONTIN) 400 MG capsule Take 400 mg by mouth 3 (three) times daily.    Marland Kitchen glucose blood (ONE TOUCH ULTRA TEST) test strip     . hydroxychloroquine (PLAQUENIL) 200 MG tablet Take 400 mg by mouth daily.  2  . isosorbide mononitrate (IMDUR) 30 MG 24 hr tablet Take 30 mg by mouth daily.    Marland Kitchen loperamide (IMODIUM) 2 MG capsule Take 1 capsule (2 mg total) by mouth See admin instructions. With onset of diarrhea, take '4mg'$  followed by '2mg'$  every 2 hours resolved. Maximum: 16 mg/day 120 capsule 0  . metFORMIN (GLUCOPHAGE) 500 MG tablet Take 500 mg by mouth daily with breakfast.     . metoprolol succinate (TOPROL-XL) 25 MG 24 hr tablet Take 25 mg by mouth daily.    . ondansetron (ZOFRAN) 8 MG tablet Take 1 tablet (8 mg total) 2 (two) times daily as needed by  mouth (Nausea or vomiting). 30 tablet 1  . pantoprazole (PROTONIX) 20 MG tablet Take 20 mg by mouth daily.    . pantoprazole (PROTONIX) 40 MG tablet Take 1 tablet (40 mg total) by mouth 2 (two) times daily. 60 tablet 3  . tiZANidine (ZANAFLEX) 2 MG tablet Take 2 mg by mouth 3 (three) times daily.    . traMADol-acetaminophen (ULTRACET) 37.5-325 MG tablet     . triamcinolone cream (KENALOG) 0.1 % Apply 1  application topically 2 (two) times daily.     No current facility-administered medications for this visit.       Marland Kitchen  PHYSICAL EXAMINATION: ECOG PERFORMANCE STATUS: 1 - Symptomatic but completely ambulatory Vitals:   02/19/19 1351  BP: 108/72  Pulse: 88  Temp: 98.1 F (36.7 C)   Filed Weights   02/19/19 1351  Weight: 184 lb (83.5 kg)   Physical Exam  Constitutional: He is oriented to person, place, and time and well-developed, well-nourished, and in no distress. No distress.  HENT:  Head: Normocephalic and atraumatic.  Nose: Nose normal.  Mouth/Throat: Oropharynx is clear and moist. No oropharyngeal exudate.  Eyes: Pupils are equal, round, and reactive to light. EOM are normal. Right eye exhibits no discharge. Left eye exhibits no discharge. No scleral icterus.  Neck: Normal range of motion. Neck supple. No JVD present.  Cardiovascular: Normal rate, regular rhythm and normal heart sounds. Exam reveals no friction rub.  No murmur heard. Pulmonary/Chest: Effort normal and breath sounds normal. No respiratory distress. He has no wheezes. He has no rales. He exhibits no tenderness.  Abdominal: Soft. Bowel sounds are normal. He exhibits no distension and no mass. There is no abdominal tenderness.  Musculoskeletal: Normal range of motion.        General: No tenderness or edema.  Lymphadenopathy:    He has no cervical adenopathy.  Neurological: He is alert and oriented to person, place, and time. No cranial nerve deficit. He exhibits normal muscle tone. Coordination normal.  Skin: Skin is warm and dry. No rash noted. He is not diaphoretic. No erythema. There is pallor.  Hyperpigmentation of skin  Psychiatric: Affect and judgment normal.    LABORATORY DATA:  I have reviewed the data as listed Lab Results  Component Value Date   WBC 7.7 02/15/2019   HGB 9.2 (L) 02/15/2019   HCT 31.3 (L) 02/15/2019   MCV 74 (L) 02/15/2019   PLT 277 02/15/2019   Recent Labs    05/15/18 0925   09/22/18 1108 09/29/18 1133 12/18/18 1344  NA 140   < > 139 135 138  K 4.3   < > 4.3 4.2 4.3  CL 108   < > 109 107 111  CO2 24   < > _0 GLUCOSE 95   < > 100* 90 95  BUN 11   < > _1 CREATININE 1.14   < > 1.53* 1.24 1.49*  CALCIUM 9.0   < > 8.8* 8.9 8.7*  GFRNONAA >60   < > 44* 56* 45*  GFRAA >60   < > 50* >60 52*  PROT 8.1  --  8.6*  --  8.1  ALBUMIN 3.8  --  3.7  --  3.2*  AST 18  --  15  --  14*  ALT 16  --  12  --  14  ALKPHOS 79  --  67  --  58  BILITOT 0.5  --  0.4  --  0.4   < > =  values in this interval not displayed.    SPEP: no M spike Serum free light chain Kappa/Lamda ratio 13.04 UPEP  Free light chain Kappa/lamda ratio 162.43, M spike '198mg'$ /24 hour   Bone marrow biopsy 03/21/2017  Bone Marrow, Aspirate,Biopsy, and Clot, left iliac - HYPERCELLULAR BONE MARROW FOR AGE WITH TRILINEAGE HEMATOPOIESIS. - PLASMACYTOSIS (PLASMA CELLS 8%)  Karyotype: loss of Y chromosome Cytogenetic MDS FISH Panel negative.   Bone marrow 04/06/2017  Bone Marrow, Aspirate,Biopsy, and Clot, right iliac and core - HYPERCELLULAR BONE MARROW FOR AGE WITH PLASMA CELL NEOPLASM - TRILINEAGE HEMATOPOIESIS. - SEE COMMENT. PERIPHERAL BLOOD: - MICROCYTIC-HYPOCHROMIC ANEMIA. Diagnosis Note The bone marrow is hypercellular with trilineage hematopoiesis but with relative abundance of erythroid precursors and increased number of megakaryocytes with nonspecific changes. Significant dyspoiesis is not seen. Iron stores are present with no ring sideroblasts. The plasma cells are increased in number representing 8% of all cells in the aspirate although focal areas in the core biopsy show 10 to 20% as primarily seen by CD138 stain. In situ hybridization for kappa and lambda light chains show kappa light chain restriction consistent with plasma cell neoplasm. Correlation with cytogenetic and FISH studies is recommended. (BNS:ecj/gt 04/07/2017)  IMAGE STUDIES I have personally reviewed  below image results.  03/15/2017 DG bone survey Met: Nonspecific lucent lesion in the distal right ulna measuring 15-16 mm, but otherwise normal bone mineralization for age throughout the visible skeleton. A solitary lytic lesion in a distal extremity would be an unusual presentation of multiple myeloma, and I favor the distal right ulna lesion is benign. Recommend correlation with serum and urine protein electrophoresis.  04/14/2017 PET scan: 1. 2.1 cm hypermetabolic soft tissue lesion in the left heel, adjacent to the calcaneal tuberosity. 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 2. Mottled FDG accumulation diffusely in the marrow space without other frankly overt hypermetabolic bony lesion. 3. Coronary artery and thoracoabdominal aortic atherosclerosis with abdominal aortic stent graft visualized in situ. 4. Bilateral renal cysts and bilateral nonobstructing renal stones.  08/24/2017 PET scan 1. No hypermetabolic osseous lesions. No definite signs of multiple myeloma on CT imaging. 2. Interval improvement in soft tissue activity along the medial aspect of the left calcaneal tuberosity, likely plantar fasciitis. 3. New small left pleural effusion. 4. Bilateral inguinal hernias containing small bowel on the right and sigmoid colon on the left. No evidence of incarceration or obstruction. 5. Otherwise stable incidental findings including diffuse atherosclerosis, bilateral renal cysts, emphysema and sigmoid diverticulosis.  ASSESSMENT & PLAN:  1. Iron deficiency anemia due to chronic blood loss   2. Multiple myeloma in remission ([place])   3. AVM (arteriovenous malformation) of small bowel, acquired with hemorrhage    #Iron deficiency anemia secondary to chronic blood loss due to multiple AVM in small bowels. Labs are reviewed and discussed with patient .  Hemoglobin 9.2 with iron panel consistent with iron deficiency anemia. Recommend patient to proceed with IV Venofer weekly x4.    #small  bowel AVMs: Needs to reestablish care with [facility] gastroenterology for additional ablation of AVMs.  #Smoldering multiple myeloma or active myeloma if hypermetabolic lesion on PET scan is related to myeloma. He was treated with 4 cycles of RVD. Patient is not currently on any maintenance treatment due to multiple comorbidities, and a borderline smoldering multiple myeloma disease status. Doing well clinically.  Last multiple myeloma panel and free light chain ratio were checked in July 2020.  Will recheck at the next visit.  Follow-up pain 10 weeks. with  reevaluation of blood work and MD reassessment.  Orders Placed This Encounter  Procedures  . CBC with Differential/Platelet    Standing Status:   Future    Standing Expiration Date:   02/19/2020  . Iron and TIBC    Standing Status:   Future    Standing Expiration Date:   02/19/2020  . Ferritin    Standing Status:   Future    Standing Expiration Date:   02/19/2020    Earlie Server, MD, PhD Hematology Oncology [facility] at [facility] Pager- [phone] 02/20/2019

## 2019-02-26 ENCOUNTER — Other Ambulatory Visit: Payer: Self-pay

## 2019-02-26 ENCOUNTER — Inpatient Hospital Stay: Payer: Medicare Other

## 2019-02-26 VITALS — BP 118/58 | HR 62 | Temp 96.0°F | Resp 18

## 2019-02-26 DIAGNOSIS — D500 Iron deficiency anemia secondary to blood loss (chronic): Secondary | ICD-10-CM | POA: Diagnosis not present

## 2019-02-26 DIAGNOSIS — D509 Iron deficiency anemia, unspecified: Secondary | ICD-10-CM

## 2019-02-26 MED ORDER — SODIUM CHLORIDE 0.9 % IV SOLN
Freq: Once | INTRAVENOUS | Status: AC
  Administered 2019-02-26: 14:00:00 via INTRAVENOUS
  Filled 2019-02-26: qty 250

## 2019-02-26 MED ORDER — IRON SUCROSE 20 MG/ML IV SOLN
200.0000 mg | Freq: Once | INTRAVENOUS | Status: AC
  Administered 2019-02-26: 14:00:00 200 mg via INTRAVENOUS
  Filled 2019-02-26: qty 10

## 2019-02-26 NOTE — Progress Notes (Signed)
Pt tolerated infusion well. Pt and VS stable at discharge.  

## 2019-02-28 ENCOUNTER — Encounter: Payer: Self-pay | Admitting: Gastroenterology

## 2019-03-02 ENCOUNTER — Other Ambulatory Visit: Payer: Self-pay

## 2019-03-05 ENCOUNTER — Inpatient Hospital Stay: Payer: Medicare Other

## 2019-03-05 ENCOUNTER — Other Ambulatory Visit: Payer: Self-pay

## 2019-03-05 VITALS — BP 110/75 | HR 89 | Temp 97.3°F | Resp 20

## 2019-03-05 DIAGNOSIS — D509 Iron deficiency anemia, unspecified: Secondary | ICD-10-CM

## 2019-03-05 DIAGNOSIS — D500 Iron deficiency anemia secondary to blood loss (chronic): Secondary | ICD-10-CM | POA: Diagnosis not present

## 2019-03-05 MED ORDER — SODIUM CHLORIDE 0.9 % IV SOLN
INTRAVENOUS | Status: DC
  Administered 2019-03-05: 14:00:00 via INTRAVENOUS
  Filled 2019-03-05: qty 250

## 2019-03-05 MED ORDER — IRON SUCROSE 20 MG/ML IV SOLN
200.0000 mg | Freq: Once | INTRAVENOUS | Status: AC
  Administered 2019-03-05: 200 mg via INTRAVENOUS
  Filled 2019-03-05: qty 10

## 2019-03-09 ENCOUNTER — Other Ambulatory Visit: Payer: Self-pay

## 2019-03-12 ENCOUNTER — Inpatient Hospital Stay: Payer: Medicare Other | Attending: Oncology

## 2019-03-12 ENCOUNTER — Other Ambulatory Visit: Payer: Self-pay

## 2019-03-12 VITALS — BP 100/62 | HR 65 | Temp 96.0°F | Resp 17

## 2019-03-12 DIAGNOSIS — D509 Iron deficiency anemia, unspecified: Secondary | ICD-10-CM | POA: Insufficient documentation

## 2019-03-12 MED ORDER — IRON SUCROSE 20 MG/ML IV SOLN
200.0000 mg | Freq: Once | INTRAVENOUS | Status: AC
  Administered 2019-03-12: 200 mg via INTRAVENOUS
  Filled 2019-03-12: qty 10

## 2019-03-12 MED ORDER — SODIUM CHLORIDE 0.9 % IV SOLN
Freq: Once | INTRAVENOUS | Status: AC
  Administered 2019-03-12: 14:00:00 via INTRAVENOUS
  Filled 2019-03-12: qty 250

## 2019-03-19 ENCOUNTER — Other Ambulatory Visit: Payer: Medicare Other

## 2019-03-20 ENCOUNTER — Ambulatory Visit: Payer: Medicare Other

## 2019-03-20 ENCOUNTER — Ambulatory Visit: Payer: Medicare Other | Admitting: Oncology

## 2019-04-26 ENCOUNTER — Ambulatory Visit: Payer: Medicare Other | Admitting: Podiatry

## 2019-04-26 ENCOUNTER — Encounter: Payer: Self-pay | Admitting: Podiatry

## 2019-04-26 ENCOUNTER — Other Ambulatory Visit: Payer: Self-pay

## 2019-04-26 DIAGNOSIS — I739 Peripheral vascular disease, unspecified: Secondary | ICD-10-CM | POA: Diagnosis not present

## 2019-04-26 DIAGNOSIS — D689 Coagulation defect, unspecified: Secondary | ICD-10-CM

## 2019-04-26 DIAGNOSIS — M79609 Pain in unspecified limb: Secondary | ICD-10-CM

## 2019-04-26 DIAGNOSIS — B351 Tinea unguium: Secondary | ICD-10-CM | POA: Diagnosis not present

## 2019-04-26 NOTE — Progress Notes (Signed)
Complaint:  Visit Type: Patient returns to my office for continued preventative foot care services. Complaint: Patient states" my nails have grown long and thick and become painful to walk and wear shoes" Patient has been diagnosed with DM with pvd and neuropathy.. The patient presents for preventative foot care services. No changes to ROS.    Podiatric Exam: Vascular: dorsalis pedis are palpable  B/L. and posterior tibial pulses are not  palpable bilateral. Capillary return is immediate. Temperature gradient is WNL. Skin turgor WNL  Sensorium: Diminished  Semmes Weinstein monofilament test.  Nail Exam: Pt has thick disfigured discolored nails with subungual debris noted bilateral entire nail hallux through fifth toenails.   Ulcer Exam: There is no evidence of ulcer or pre-ulcerative changes or infection. Orthopedic Exam: Muscle tone and strength are WNL. No limitations in general ROM. No crepitus or effusions noted. Foot type and digits show no abnormalities. Bony prominences are unremarkable. HAV  B/L and hammer toe  B/L.  Pes planus. Rigid hammer toe second right. Skin: No Porokeratosis. No infection or ulcers.  Clavi second toe right asymptomatic  Diagnosis:  Onychomycosis, , Pain in right toe, pain in left toes,    Treatment & Plan Procedures and Treatment: Consent by patient was obtained for treatment procedures. The patient understood the discussion of treatment and procedures well. All questions were answered thoroughly reviewed. Debridement of mycotic and hypertrophic toenails, 1 through 5 bilateral and clearing of subungual debris. No ulceration, no infection noted.  Return Visit-Office Procedure: Patient instructed to return to the office for a follow up visit 10 weeks  for continued evaluation and treatment.    Gardiner Barefoot DPM

## 2019-04-27 ENCOUNTER — Other Ambulatory Visit: Payer: Medicare Other

## 2019-04-30 ENCOUNTER — Other Ambulatory Visit: Payer: Medicare Other

## 2019-05-02 ENCOUNTER — Ambulatory Visit: Payer: Medicare Other | Admitting: Oncology

## 2019-05-02 ENCOUNTER — Ambulatory Visit: Payer: Medicare Other

## 2019-05-09 ENCOUNTER — Other Ambulatory Visit: Payer: Self-pay

## 2019-05-09 ENCOUNTER — Inpatient Hospital Stay: Payer: Medicare Other | Attending: Oncology

## 2019-05-09 DIAGNOSIS — M069 Rheumatoid arthritis, unspecified: Secondary | ICD-10-CM | POA: Diagnosis not present

## 2019-05-09 DIAGNOSIS — I129 Hypertensive chronic kidney disease with stage 1 through stage 4 chronic kidney disease, or unspecified chronic kidney disease: Secondary | ICD-10-CM | POA: Insufficient documentation

## 2019-05-09 DIAGNOSIS — I4891 Unspecified atrial fibrillation: Secondary | ICD-10-CM | POA: Diagnosis not present

## 2019-05-09 DIAGNOSIS — K219 Gastro-esophageal reflux disease without esophagitis: Secondary | ICD-10-CM | POA: Diagnosis not present

## 2019-05-09 DIAGNOSIS — Z950 Presence of cardiac pacemaker: Secondary | ICD-10-CM | POA: Insufficient documentation

## 2019-05-09 DIAGNOSIS — F17210 Nicotine dependence, cigarettes, uncomplicated: Secondary | ICD-10-CM | POA: Insufficient documentation

## 2019-05-09 DIAGNOSIS — C9001 Multiple myeloma in remission: Secondary | ICD-10-CM | POA: Diagnosis present

## 2019-05-09 DIAGNOSIS — Z7951 Long term (current) use of inhaled steroids: Secondary | ICD-10-CM | POA: Insufficient documentation

## 2019-05-09 DIAGNOSIS — E119 Type 2 diabetes mellitus without complications: Secondary | ICD-10-CM | POA: Diagnosis not present

## 2019-05-09 DIAGNOSIS — Z8546 Personal history of malignant neoplasm of prostate: Secondary | ICD-10-CM | POA: Insufficient documentation

## 2019-05-09 DIAGNOSIS — E785 Hyperlipidemia, unspecified: Secondary | ICD-10-CM | POA: Diagnosis not present

## 2019-05-09 DIAGNOSIS — R5383 Other fatigue: Secondary | ICD-10-CM | POA: Diagnosis not present

## 2019-05-09 DIAGNOSIS — Z923 Personal history of irradiation: Secondary | ICD-10-CM | POA: Diagnosis not present

## 2019-05-09 DIAGNOSIS — N183 Chronic kidney disease, stage 3 unspecified: Secondary | ICD-10-CM | POA: Insufficient documentation

## 2019-05-09 DIAGNOSIS — Z7902 Long term (current) use of antithrombotics/antiplatelets: Secondary | ICD-10-CM | POA: Diagnosis not present

## 2019-05-09 DIAGNOSIS — Z79899 Other long term (current) drug therapy: Secondary | ICD-10-CM | POA: Insufficient documentation

## 2019-05-09 DIAGNOSIS — Z7984 Long term (current) use of oral hypoglycemic drugs: Secondary | ICD-10-CM | POA: Insufficient documentation

## 2019-05-09 DIAGNOSIS — D631 Anemia in chronic kidney disease: Secondary | ICD-10-CM | POA: Insufficient documentation

## 2019-05-09 DIAGNOSIS — Z7901 Long term (current) use of anticoagulants: Secondary | ICD-10-CM | POA: Insufficient documentation

## 2019-05-09 DIAGNOSIS — F419 Anxiety disorder, unspecified: Secondary | ICD-10-CM | POA: Diagnosis not present

## 2019-05-09 DIAGNOSIS — D500 Iron deficiency anemia secondary to blood loss (chronic): Secondary | ICD-10-CM | POA: Diagnosis present

## 2019-05-09 LAB — CBC WITH DIFFERENTIAL/PLATELET
Abs Immature Granulocytes: 0.02 10*3/uL (ref 0.00–0.07)
Basophils Absolute: 0 10*3/uL (ref 0.0–0.1)
Basophils Relative: 0 %
Eosinophils Absolute: 0.2 10*3/uL (ref 0.0–0.5)
Eosinophils Relative: 3 %
HCT: 36.6 % — ABNORMAL LOW (ref 39.0–52.0)
Hemoglobin: 10.6 g/dL — ABNORMAL LOW (ref 13.0–17.0)
Immature Granulocytes: 0 %
Lymphocytes Relative: 13 %
Lymphs Abs: 1 10*3/uL (ref 0.7–4.0)
MCH: 21.8 pg — ABNORMAL LOW (ref 26.0–34.0)
MCHC: 29 g/dL — ABNORMAL LOW (ref 30.0–36.0)
MCV: 75.3 fL — ABNORMAL LOW (ref 80.0–100.0)
Monocytes Absolute: 0.6 10*3/uL (ref 0.1–1.0)
Monocytes Relative: 8 %
Neutro Abs: 5.9 10*3/uL (ref 1.7–7.7)
Neutrophils Relative %: 76 %
Platelets: 237 10*3/uL (ref 150–400)
RBC: 4.86 MIL/uL (ref 4.22–5.81)
RDW: 21.6 % — ABNORMAL HIGH (ref 11.5–15.5)
WBC: 7.8 10*3/uL (ref 4.0–10.5)
nRBC: 0 % (ref 0.0–0.2)

## 2019-05-09 LAB — IRON AND TIBC
Iron: 18 ug/dL — ABNORMAL LOW (ref 45–182)
Saturation Ratios: 4 % — ABNORMAL LOW (ref 17.9–39.5)
TIBC: 419 ug/dL (ref 250–450)
UIBC: 401 ug/dL

## 2019-05-09 LAB — FERRITIN: Ferritin: 8 ng/mL — ABNORMAL LOW (ref 24–336)

## 2019-05-11 ENCOUNTER — Inpatient Hospital Stay: Payer: Medicare Other

## 2019-05-11 ENCOUNTER — Telehealth: Payer: Self-pay

## 2019-05-11 ENCOUNTER — Other Ambulatory Visit: Payer: Self-pay

## 2019-05-11 ENCOUNTER — Inpatient Hospital Stay (HOSPITAL_BASED_OUTPATIENT_CLINIC_OR_DEPARTMENT_OTHER): Payer: Medicare Other | Admitting: Oncology

## 2019-05-11 ENCOUNTER — Encounter: Payer: Self-pay | Admitting: Oncology

## 2019-05-11 VITALS — BP 141/74 | HR 86 | Temp 97.9°F | Resp 16 | Wt 184.0 lb

## 2019-05-11 VITALS — BP 129/71 | HR 60 | Temp 96.3°F | Resp 17

## 2019-05-11 DIAGNOSIS — C9001 Multiple myeloma in remission: Secondary | ICD-10-CM

## 2019-05-11 DIAGNOSIS — D500 Iron deficiency anemia secondary to blood loss (chronic): Secondary | ICD-10-CM

## 2019-05-11 DIAGNOSIS — K5521 Angiodysplasia of colon with hemorrhage: Secondary | ICD-10-CM

## 2019-05-11 DIAGNOSIS — D509 Iron deficiency anemia, unspecified: Secondary | ICD-10-CM

## 2019-05-11 MED ORDER — SODIUM CHLORIDE 0.9 % IV SOLN
Freq: Once | INTRAVENOUS | Status: DC
  Filled 2019-05-11: qty 250

## 2019-05-11 MED ORDER — SODIUM CHLORIDE 0.9 % IV SOLN
Freq: Once | INTRAVENOUS | Status: AC
  Administered 2019-05-11: 15:00:00 via INTRAVENOUS
  Filled 2019-05-11: qty 250

## 2019-05-11 MED ORDER — IRON SUCROSE 20 MG/ML IV SOLN
200.0000 mg | Freq: Once | INTRAVENOUS | Status: AC
  Administered 2019-05-11: 200 mg via INTRAVENOUS
  Filled 2019-05-11: qty 10

## 2019-05-11 NOTE — Progress Notes (Signed)
Pt tolerated infusion well. Pt and VS stable at discharge.  

## 2019-05-11 NOTE — Telephone Encounter (Signed)
Called pt to follow up on referral to [place] for a enteroscopy recommended by Dr. Vicente Males after pt's capsule study. I enquired if he was able to schedule his enteroscopy procedure with [facility] as we received a letter stating they weren't able to reach pt via phone so they sent pt a letter via mail. Pt states he never received their calls or the mailed letter. I explained to pt that they gave instructions for pt to contact their office to schedule. I have given pt their contact information and he agrees to call them today.

## 2019-05-11 NOTE — Progress Notes (Signed)
Patient here for followup. No concerns voiced. Pt is currently on eliquis 2.5mg  BID. Plavix and aspirin have been discontinued from medication list as he is no longer taking.

## 2019-05-13 NOTE — Progress Notes (Signed)
Hematology/Oncology Follow Up Note [facility] Telephone:([phone] [phone]) [phone]  Patient Care Team: Jodi Marble, MD as PCP - General (Internal Medicine) Bary Castilla, Forest Gleason, MD (General Surgery) Edrick Kins, MD as Rounding Team (Internal Medicine) Hillary Bow, MD (Inactive) as Consulting Physician (Internal Medicine) [name], MD as Surgeon (Gastroenterology) Isaias Cowman, MD as Consulting Physician (Cardiology) Earlie Server, MD as Consulting Physician (Oncology)  REFERRING PROVIDER: Dr.Lateef, [place]  REASON FOR VISIT Follow up for management of plasma cell dyscrasia.  And iron deficiency anemia.   HISTORY OF PRESENTING ILLNESS:  78 y.o.  male with PMH listed below who presents to follow up on the evaluation and management of his abnormal urine protein electrophoresis results. I reviewed the records from [facility], [place] and [place] was performed and HemOnc related medical problems are listed below.  He lives with a friend. He has 3 adult children.   1 Chronic Kidney disease, Stage III: patient follows up with Dr.Lateef.  2 Proteinuria:  02/18/2017: Albumin/Creatinine ratio was 7, Urine protein electrophoresis,Random Urine revealed M Spike 43.2%, total protein of 43.65m/dl,  01/05/2017 Albumin/Creatinine ratio was 455.2, Albumin 996.5, , 2.15.2018 Albumin/Creatinine ratio was 80.3, Albumin 73.3, Urine protein electrophoresis,Random Urine revealed M Spike 29.7%, total protein of 29.155mdl, 06/30/2016 Serum protein electrophoresis did not detect M spike.  Autoimmune disorder work up showed Negative anti dsDNA, RNP antibodies, smith antibody, Sjogren antibodies, anti Jo-1, positive antichromotin antibodies, positive ANA,  3 Anemia of chronic kidney disease:  4 Iron deficiency anemia: history of iron deficiency, ferritin was 7 on 08/11/2016. He had EGD and colonoscopy that were done  this year which showed duodenitis/esphagitis/diverticulosis/benigh polyps. # small bowel AVMs was ablated [facility]  5 Rheumatoid arthritis: he follows up with Dr.Kernodle and is on chronic MTX. He Is currently off MTX reports joint pains are controlled, not quite symptomatic.   Patient denies any persistent bone pain or any pain. He continue to feel lack of energy, persistent, not improved with resting or taking naps. He also has lost 25 pounds in the past year which was unintentional.   # Rheumatoid arthritis and currently is off MTX.  # It is ambiguous whether he has truly symptomatic multiple myeloma or a smoldering myeloma, given that anemia can be secondary to iron deficiency and CKD.  The hypermetabolic 2.1 cm hypermetabolic soft tissue lesion in the left heel, can reflect a Plasmacytoma,which should have been biopsied. However patient initially denies being on any blood thinner, later he was found to be on Plavix with pre biopsy questionnaire screening, and biopsy was held. Patient has to contact cardiologist to hold plavix for seven days before procedure. He feels frustrated about multiple workup and meanwhile he becomes more fatigues, with worsening of kidney function. Patient was reluctant about proceeding additional invasive procedures and wants to start treatment. Decision was made to start active MM treatment.   # [facility] bone marrow transplant. He is not considered to  good candidate for transplant. Patient cardiologist Dr. KaChancy Milroyad started patient on Lasix 20 mg daily. [facility] as patient was having dizziness and borderline blood pressure during his clinic visit there.  10/02/2018 He had follow-up left foot/ankle x-ray as a follow-up of his left heel lesion which was initially presented on PET scan and not detectable on follow-up PET scan.  Images were reviewed by me.  No  suspicious acute or subacute osseous abnormality  Current Treatment:  S/p RVD x 4 cycles tolerates well except cytopenia.  Revlimid 10 mg PO once per day on days 1 to 14 (dose reduced due to GFR) Revlimid renal dosing of 65m,  Velcade 1.3 mg/m2 IV once per day on days 1,  8, 15 Dexamethasone 20 mg PO once per day on days 1, 8, 15 given patient's diabetes Given patient's multiple comorbidity and the side effects of cytopenia during his RVD treatment and the uncertainty of this is really a site of plasmacytoma or not, I recommend not to continue him on maintenance Revlimid.   INTERVAL HISTORY Patient presents for follow-up of management of multiple myeloma/smoldering multiple myeloma and iron deficiency anemia. Patient is not currently on any maintenance treatments. Patient continues to feel fatigued, improved compared to previously Patient has repeat small bowel capsule study on 02/16/2019 Which showed multiple AVMs seen in the mid jejunum proximal to the site of prior tattoo.  Patient is on Eliquis 2.5 mg twice daily.   Review of Systems  Constitutional: Positive for fatigue. Negative for appetite change, chills, fever and unexpected weight change.  HENT:   Negative for hearing loss and voice change.   Eyes: Negative for eye problems and icterus.  Respiratory: Negative for chest tightness, cough and shortness of breath.   Cardiovascular: Negative for chest pain and leg swelling.  Gastrointestinal: Negative for abdominal distention, abdominal pain and diarrhea.  Endocrine: Negative for hot flashes.  Genitourinary: Negative for difficulty urinating, dysuria and frequency.   Musculoskeletal: Negative for arthralgias.  Skin: Negative for itching and rash.  Neurological: Negative for light-headedness and numbness.  Hematological: Negative for adenopathy. Does not bruise/bleed easily.  Psychiatric/Behavioral: Negative for confusion.     MEDICAL HISTORY:  Past Medical History:  Diagnosis  Date  . Anxiety   . Arthritis    rheumatoid  . Atrial fibrillation (HCC)    hx of  . Cancer ([place]   . Chronic kidney disease   . Colon polyp 07-07-15   TUBULAR ADENOMA WITH AT LEAST HIGH-GRADE / Dr RRayann Heman . Diabetes mellitus without complication ([place]   . Dysrhythmia    bradycardia....Marland KitchenMarland Kitchen to 1 heart block  . GERD (gastroesophageal reflux disease)   . Heart murmur    patient unaware of history of murmur  . Hypercholesterolemia   . Hypertension   . Presence of permanent cardiac pacemaker   . Prostate cancer ([place] 01/01/13, 01/30/14   Gleason 3+4=7, volume 46.6 cc  . Rheumatoid arthritis ([place]   . S/P radiation therapy  04/03/2014 through 06/04/2014                                                      Prostate 7800 cGy in 40 sessions                          . Sleep apnea    uses cpap but it is not in the hospital with him    SURGICAL HISTORY: Past Surgical History:  Procedure Laterality Date  . ABDOMINAL AORTIC ANEURYSM REPAIR  11/2013  . COLON SURGERY  March 2017   Right hemicolectomy for tubulovillous adenoma with high-grade dysplasia.  . COLONOSCOPY WITH PROPOFOL N/A 07/07/2015   Procedure: COLONOSCOPY WITH PROPOFOL;  Surgeon: MJosefine Class MD;  Location: [facility] ENDOSCOPY;  Service: Endoscopy;  Laterality: N/A;  . COLONOSCOPY WITH PROPOFOL N/A 10/28/2016   Procedure: COLONOSCOPY WITH PROPOFOL;  Surgeon: [name], MD;  Location[facility] ENDOSCOPY;  Service: Endoscopy;  Laterality: N/A;  . ESOPHAGOGASTRODUODENOSCOPY (EGD) WITH PROPOFOL N/A 10/28/2016   Procedure: ESOPHAGOGASTRODUODENOSCOPY (EGD) WITH PROPOFOL;  Surgeon: [name], MD;  Location: [facility] ENDOSCOPY;  Service: Endoscopy;  Laterality: N/A;  . GIVENS CAPSULE STUDY N/A 07/25/2017   Procedure: GIVENS CAPSULE STUDY;  Surgeon: [name], MD;  Location: [facility] ENDOSCOPY;  Service: Gastroenterology;  Laterality: N/A;  . GIVENS CAPSULE STUDY N/A 01/29/2019   Procedure: GIVENS CAPSULE STUDY;  Surgeon: [name], MD;   Location: [facility] ENDOSCOPY;  Service: Gastroenterology;  Laterality: N/A;  . LAPAROSCOPIC RIGHT COLECTOMY Right 08/08/2015   Procedure: LAPAROSCOPIC RIGHT COLECTOMY;  Surgeon: Robert Bellow, MD;  Location: [facility] ORS;  Service: General;  Laterality: Right;  . NASAL SINUS SURGERY    . PACEMAKER INSERTION Left 08/26/2016   Procedure: INSERTION PACEMAKER;  Surgeon: Isaias Cowman, MD;  Location: [facility] ORS;  Service: Cardiovascular;  Laterality: Left;  . PROSTATE BIOPSY  01/01/13, 01/30/14   Gleason 3+3=6, vol 46.6 cc  . TONSILLECTOMY    . uvula surgery     for sleep apnea    SOCIAL HISTORY: Social History   Socioeconomic History  . Marital status: Divorced    Spouse name: Not on file  . Number of children: Not on file  . Years of education: Not on file  . Highest education level: Not on file  Occupational History  . Not on file  Social Needs  . Financial resource strain: Not on file  . Food insecurity    Worry: Not on file    Inability: Not on file  . Transportation needs    Medical: Not on file    Non-medical: Not on file  Tobacco Use  . Smoking status: Current Every Day Smoker    Packs/day: 1.00    Years: 60.00    Pack years: 60.00    Types: Cigarettes  . Smokeless tobacco: Never Used  . Tobacco comment: DOWN TO 1/2 PPD  Substance and Sexual Activity  . Alcohol use: Yes    Alcohol/week: 1.0 standard drinks    Types: 1 Cans of beer per week    Comment: occasionally  . Drug use: No  . Sexual activity: Not Currently  Lifestyle  . Physical activity    Days per week: Not on file    Minutes per session: Not on file  . Stress: Not on file  Relationships  . Social Herbalist on phone: Not on file    Gets together: Not on file    Attends religious service: Not on file    Active member of club or organization: Not on file    Attends meetings of clubs or organizations: Not on file    Relationship status: Not on file  . Intimate partner violence    Fear of  current or ex partner: Not on file    Emotionally abused: Not on file    Physically abused: Not on file    Forced sexual activity: Not on file  Other Topics Concern  . Not on file  Social History Narrative  . Not on file    FAMILY HISTORY: Family History  Problem Relation Age of Onset  . Heart attack Mother   . Cirrhosis Father   . Pancreatic cancer Brother   . Diabetes Brother   .  Diabetes Daughter        medication induced for cancer treatments  . Cancer Daughter        breast, brain    ALLERGIES:  has No Known Allergies.  MEDICATIONS:  Current Outpatient Medications  Medication Sig Dispense Refill  . acetaminophen (TYLENOL) 325 MG tablet Take 325 mg by mouth every 6 (six) hours as needed.    Marland Kitchen albuterol (PROVENTIL HFA;VENTOLIN HFA) 108 (90 Base) MCG/ACT inhaler     . ALPRAZolam (XANAX) 0.25 MG tablet Take by mouth 2 (two) times daily as needed.     Marland Kitchen atorvastatin (LIPITOR) 80 MG tablet Take 80 mg by mouth at bedtime.     . Butalbital-APAP-Caffeine (FIORICET) 50-300-40 MG CAPS Take 1 capsule every four to six hours as needed    . ELIQUIS 2.5 MG TABS tablet Take 2.5 mg by mouth 2 (two) times daily.     Marland Kitchen ezetimibe (ZETIA) 10 MG tablet Take 1 tablet by mouth 1 day or 1 dose.    . fexofenadine (ALLEGRA) 180 MG tablet Take 180 mg by mouth daily.    . fluticasone (FLONASE) 50 MCG/ACT nasal spray Place 2 sprays into both nostrils daily as needed for rhinitis.     . Fluticasone Furoate-Vilanterol (BREO ELLIPTA) 100-25 MCG/INH AEPB Inhale 1 puff into the lungs daily.     Marland Kitchen gabapentin (NEURONTIN) 400 MG capsule Take 400 mg by mouth 3 (three) times daily.    Marland Kitchen glucose blood (ONE TOUCH ULTRA TEST) test strip     . hydroxychloroquine (PLAQUENIL) 200 MG tablet Take 400 mg by mouth daily.  2  . isosorbide mononitrate (IMDUR) 30 MG 24 hr tablet Take 30 mg by mouth daily.    Marland Kitchen loperamide (IMODIUM) 2 MG capsule Take 1 capsule (2 mg total) by mouth See admin instructions. With onset of  diarrhea, take 56m followed by 246mevery 2 hours resolved. Maximum: 16 mg/day 120 capsule 0  . metFORMIN (GLUCOPHAGE) 500 MG tablet Take 500 mg by mouth daily with breakfast.     . metoprolol succinate (TOPROL-XL) 25 MG 24 hr tablet Take 25 mg by mouth daily.    . ondansetron (ZOFRAN) 8 MG tablet Take 1 tablet (8 mg total) 2 (two) times daily as needed by mouth (Nausea or vomiting). 30 tablet 1  . pantoprazole (PROTONIX) 20 MG tablet Take 20 mg by mouth daily.    . Marland KitcheniZANidine (ZANAFLEX) 2 MG tablet Take 2 mg by mouth 3 (three) times daily.    . traMADol-acetaminophen (ULTRACET) 37.5-325 MG tablet     . triamcinolone cream (KENALOG) 0.1 % Apply 1 application topically 2 (two) times daily.    . Marland KitchenmLODipine-benazepril (LOTREL) 5-20 MG capsule Take 1 capsule by mouth daily.    . benazepril (LOTENSIN) 20 MG tablet Take 20 mg by mouth daily.    . benzonatate (TESSALON) 100 MG capsule Take 100 mg by mouth 3 (three) times daily.    . Cholecalciferol (D3-50) 50000 units capsule Take 50,000 Units by mouth once a week.     . clopidogrel (PLAVIX) 75 MG tablet Take by mouth.    . colchicine 0.6 MG tablet     . folic acid (FOLVITE) 1 MG tablet Take by mouth.    . furosemide (LASIX) 20 MG tablet Take 20 mg by mouth daily.    . Marland Kitchenabapentin (NEURONTIN) 300 MG capsule Take 300 mg by mouth 3 (three) times daily.  2  . pantoprazole (PROTONIX) 40 MG tablet Take 1 tablet (40 mg  total) by mouth 2 (two) times daily. (Patient not taking: Reported on 05/11/2019) 60 tablet 3   No current facility-administered medications for this visit.       Marland Kitchen  PHYSICAL EXAMINATION: ECOG PERFORMANCE STATUS: 1 - Symptomatic but completely ambulatory Vitals:   05/11/19 1345  BP: (!) 141/74  Pulse: 86  Resp: 16  Temp: 97.9 F (36.6 C)  SpO2: 100%   Filed Weights   05/11/19 1345  Weight: 184 lb (83.5 kg)   Physical Exam  Constitutional: He is oriented to person, place, and time and well-developed, well-nourished, and in no  distress. No distress.  HENT:  Head: Normocephalic and atraumatic.  Nose: Nose normal.  Mouth/Throat: Oropharynx is clear and moist. No oropharyngeal exudate.  Eyes: Pupils are equal, round, and reactive to light. EOM are normal. Right eye exhibits no discharge. Left eye exhibits no discharge. No scleral icterus.  Neck: Normal range of motion. Neck supple. No JVD present.  Cardiovascular: Normal rate, regular rhythm and normal heart sounds. Exam reveals no friction rub.  No murmur heard. Pulmonary/Chest: Effort normal and breath sounds normal. No respiratory distress. He has no wheezes. He has no rales. He exhibits no tenderness.  Abdominal: Soft. Bowel sounds are normal. He exhibits no distension and no mass. There is no abdominal tenderness.  Musculoskeletal: Normal range of motion.        General: No tenderness or edema.  Lymphadenopathy:    He has no cervical adenopathy.  Neurological: He is alert and oriented to person, place, and time. No cranial nerve deficit. He exhibits normal muscle tone. Coordination normal.  Skin: Skin is warm and dry. No rash noted. He is not diaphoretic. No erythema. No pallor.  Hypopigmentation of skin  Psychiatric: Affect and judgment normal.    LABORATORY DATA:  I have reviewed the data as listed Lab Results  Component Value Date   WBC 7.8 05/09/2019   HGB 10.6 (L) 05/09/2019   HCT 36.6 (L) 05/09/2019   MCV 75.3 (L) 05/09/2019   PLT 237 05/09/2019   Recent Labs    05/15/18 0925  09/22/18 1108 09/29/18 1133 12/18/18 1344  NA 140   < > 139 135 138  K 4.3   < > 4.3 4.2 4.3  CL 108   < > 109 107 111  CO2 24   < > '24 22 23  ' GLUCOSE 95   < > 100* 90 95  BUN 11   < > '17 18 22  ' CREATININE 1.14   < > 1.53* 1.24 1.49*  CALCIUM 9.0   < > 8.8* 8.9 8.7*  GFRNONAA >60   < > 44* 56* 45*  GFRAA >60   < > 50* >60 52*  PROT 8.1  --  8.6*  --  8.1  ALBUMIN 3.8  --  3.7  --  3.2*  AST 18  --  15  --  14*  ALT 16  --  12  --  14  ALKPHOS 79  --  67  --   58  BILITOT 0.5  --  0.4  --  0.4   < > = values in this interval not displayed.    SPEP: no M spike Serum free light chain Kappa/Lamda ratio 13.04 UPEP  Free light chain Kappa/lamda ratio 162.43, M spike 156m/24 hour   Bone marrow biopsy 03/21/2017  Bone Marrow, Aspirate,Biopsy, and Clot, left iliac - HYPERCELLULAR BONE MARROW FOR AGE WITH TRILINEAGE HEMATOPOIESIS. - PLASMACYTOSIS (PLASMA CELLS 8%)  Karyotype:  loss of Y chromosome Cytogenetic MDS FISH Panel negative.   Bone marrow 04/06/2017  Bone Marrow, Aspirate,Biopsy, and Clot, right iliac and core - HYPERCELLULAR BONE MARROW FOR AGE WITH PLASMA CELL NEOPLASM - TRILINEAGE HEMATOPOIESIS. - SEE COMMENT. PERIPHERAL BLOOD: - MICROCYTIC-HYPOCHROMIC ANEMIA. Diagnosis Note The bone marrow is hypercellular with trilineage hematopoiesis but with relative abundance of erythroid precursors and increased number of megakaryocytes with nonspecific changes. Significant dyspoiesis is not seen. Iron stores are present with no ring sideroblasts. The plasma cells are increased in number representing 8% of all cells in the aspirate although focal areas in the core biopsy show 10 to 20% as primarily seen by CD138 stain. In situ hybridization for kappa and lambda light chains show kappa light chain restriction consistent with plasma cell neoplasm. Correlation with cytogenetic and FISH studies is recommended. (BNS:ecj/gt 04/07/2017)  IMAGE STUDIES I have personally reviewed below image results.  03/15/2017 DG bone survey Met: Nonspecific lucent lesion in the distal right ulna measuring 15-16 mm, but otherwise normal bone mineralization for age throughout the visible skeleton. A solitary lytic lesion in a distal extremity would be an unusual presentation of multiple myeloma, and I favor the distal right ulna lesion is benign. Recommend correlation with serum and urine protein electrophoresis.  04/14/2017 PET scan: 1. 2.1 cm hypermetabolic soft  tissue lesion in the left heel, adjacent to the calcaneal tuberosity. 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 2. Mottled FDG accumulation diffusely in the marrow space without other frankly overt hypermetabolic bony lesion. 3. Coronary artery and thoracoabdominal aortic atherosclerosis with abdominal aortic stent graft visualized in situ. 4. Bilateral renal cysts and bilateral nonobstructing renal stones.  08/24/2017 PET scan 1. No hypermetabolic osseous lesions. No definite signs of multiple myeloma on CT imaging. 2. Interval improvement in soft tissue activity along the medial aspect of the left calcaneal tuberosity, likely plantar fasciitis. 3. New small left pleural effusion. 4. Bilateral inguinal hernias containing small bowel on the right and sigmoid colon on the left. No evidence of incarceration or obstruction. 5. Otherwise stable incidental findings including diffuse atherosclerosis, bilateral renal cysts, emphysema and sigmoid diverticulosis.  ASSESSMENT & PLAN:  1. Iron deficiency anemia due to chronic blood loss   2. Multiple myeloma in remission ([place])   3. AVM (arteriovenous malformation) of small bowel, acquired with hemorrhage    #Iron deficiency anemia secondary to chronic blood loss due to multiple AVM in small bowels. Labs reviewed and discussed with patient. Hemoglobin improved to 10.6.  Iron panel continues to show iron deficiency Proceed with IV Venofer 200 mg x 1 today. Repeat another IV Venofer in 2 to 3 weeks. Patient has an appointment with [place] [facility] at the beginning of January.  #Smoldering multiple myeloma or active myeloma if hypermetabolic lesion on PET scan is related to myeloma. He was treated with 4 cycles of RVD. Patient is not currently on any maintenance treatment due to multiple comorbidities, and a borderline smoldering multiple myeloma disease status. Check multiple myeloma panel at the next visit. Follow-up in 8 to 10 weeks with  reevaluation of blood work and MD reassessment.  Orders Placed This Encounter  Procedures  . Multiple Myeloma Panel (SPEP&IFE w/QIG)    Standing Status:   Future    Standing Expiration Date:   05/12/2020  . Kappa/lambda light chains    Standing Status:   Future    Standing Expiration Date:   05/12/2020    Earlie Server, MD, PhD Hematology Oncology [place] at [facility]  Pager- [phone] 05/13/2019

## 2019-05-21 ENCOUNTER — Ambulatory Visit: Payer: Medicare Other | Admitting: Gastroenterology

## 2019-05-25 ENCOUNTER — Other Ambulatory Visit: Payer: Self-pay

## 2019-05-28 ENCOUNTER — Other Ambulatory Visit: Payer: Self-pay

## 2019-05-28 ENCOUNTER — Inpatient Hospital Stay: Payer: Medicare Other

## 2019-05-28 VITALS — BP 98/62 | HR 65 | Temp 97.3°F | Resp 18

## 2019-05-28 DIAGNOSIS — D500 Iron deficiency anemia secondary to blood loss (chronic): Secondary | ICD-10-CM

## 2019-05-28 DIAGNOSIS — D509 Iron deficiency anemia, unspecified: Secondary | ICD-10-CM

## 2019-05-28 MED ORDER — IRON SUCROSE 20 MG/ML IV SOLN
200.0000 mg | Freq: Once | INTRAVENOUS | Status: AC
  Administered 2019-05-28: 200 mg via INTRAVENOUS
  Filled 2019-05-28: qty 10

## 2019-05-28 MED ORDER — SODIUM CHLORIDE 0.9 % IV SOLN
INTRAVENOUS | Status: DC
  Filled 2019-05-28: qty 250

## 2019-06-27 ENCOUNTER — Encounter: Payer: Self-pay | Admitting: Student

## 2019-07-05 ENCOUNTER — Ambulatory Visit: Payer: Medicare Other | Admitting: Podiatry

## 2019-07-05 ENCOUNTER — Encounter: Payer: Self-pay | Admitting: Podiatry

## 2019-07-05 ENCOUNTER — Other Ambulatory Visit: Payer: Self-pay

## 2019-07-05 DIAGNOSIS — I739 Peripheral vascular disease, unspecified: Secondary | ICD-10-CM | POA: Diagnosis not present

## 2019-07-05 DIAGNOSIS — M79676 Pain in unspecified toe(s): Secondary | ICD-10-CM

## 2019-07-05 DIAGNOSIS — D689 Coagulation defect, unspecified: Secondary | ICD-10-CM | POA: Diagnosis not present

## 2019-07-05 DIAGNOSIS — B351 Tinea unguium: Secondary | ICD-10-CM

## 2019-07-05 NOTE — Progress Notes (Signed)
Complaint:  Visit Type: Patient returns to my office for continued preventative foot care services. Complaint: Patient states" my nails have grown long and thick and become painful to walk and wear shoes" Patient has been diagnosed with DM with pvd and neuropathy.. The patient presents for preventative foot care services. No changes to ROS.    Podiatric Exam: Vascular: dorsalis pedis are palpable  B/L. and posterior tibial pulses are not  palpable bilateral. Capillary return is immediate. Temperature gradient is WNL. Skin turgor WNL  Sensorium: Diminished  Semmes Weinstein monofilament test.  Nail Exam: Pt has thick disfigured discolored nails with subungual debris noted bilateral entire nail hallux through fifth toenails.   Ulcer Exam: There is no evidence of ulcer or pre-ulcerative changes or infection. Orthopedic Exam: Muscle tone and strength are WNL. No limitations in general ROM. No crepitus or effusions noted. Foot type and digits show no abnormalities. Bony prominences are unremarkable. HAV  B/L and hammer toe  B/L.  Pes planus. Rigid hammer toe second right. Skin: No Porokeratosis. No infection or ulcers.  Clavi second toe right resolved.  Diagnosis:  Onychomycosis, , Pain in right toe, pain in left toes,    Treatment & Plan Procedures and Treatment: Consent by patient was obtained for treatment procedures. The patient understood the discussion of treatment and procedures well. All questions were answered thoroughly reviewed. Debridement of mycotic and hypertrophic toenails, 1 through 5 bilateral and clearing of subungual debris. No ulceration, no infection noted.  Return Visit-Office Procedure: Patient instructed to return to the office for a follow up visit 10 weeks  for continued evaluation and treatment.    Gardiner Barefoot DPM

## 2019-07-10 ENCOUNTER — Ambulatory Visit: Payer: Medicare PPO | Admitting: Gastroenterology

## 2019-07-10 ENCOUNTER — Other Ambulatory Visit: Payer: Self-pay

## 2019-07-10 ENCOUNTER — Ambulatory Visit: Payer: Medicare Other | Admitting: Gastroenterology

## 2019-07-10 VITALS — BP 90/62 | HR 76 | Temp 97.6°F | Ht 71.0 in | Wt 187.0 lb

## 2019-07-10 DIAGNOSIS — D500 Iron deficiency anemia secondary to blood loss (chronic): Secondary | ICD-10-CM | POA: Diagnosis not present

## 2019-07-10 DIAGNOSIS — K5520 Angiodysplasia of colon without hemorrhage: Secondary | ICD-10-CM | POA: Diagnosis not present

## 2019-07-10 NOTE — Progress Notes (Signed)
Omar Bellows MD, MRCP(U.K) [address]  [place]  [place], [place] [address]  Main: [phone]  Fax: [phone]   Primary Care Physician: Omar Marble, MD  Primary Gastroenterologist:  Dr. Jonathon [name]   Follow-up for iron deficiency anemia secondary to small bowel AVMs  HPI: Omar [name] is a 79 y.o. male   Summary of history : He has a history of iron deficiency anemia, multiple myeloma,right colectomy in 2017 for  A polyp with high-grade dysplasia in the ascending colon. He was hospitalized in 2018 with melena. colonoscopy in May 2018 nonbleeding internal hemorrhoids were seen. Multiple medium-sized AVMs were found in the rectum nonbleeding and the appearance suggestive of radiation proctitis very close to the anal verge. 3 sessile polyps resected.egd May 2018  noted gastritis duodenitis LA grade a esophagitis, benign stricture/Schatzki's ring was seen which was not dilated as he did not have any dysphagia. Started on a PPI . He does have CKD.  07/25/2017: Capsule study of small bowel: Multiple bleeding and nonbleeding AVMs of the small bowel was seen. Subsequently referred to [facility] andunderwent balloon enteroscopy anterograde and had multiple AVMs in the duodenum and jejunum mid jejunum ablated. Distal aspect reached was tattooed.He has been taking his Eliquis.  01/02/2019: Urine analysis: No blood 02/15/2019: Capsule study of the small bowel shows few nonbleeding AVMs in the mid jejunum which is proximal to the area of the tattoo.    Interval history9/03/2018-07/10/2019  He follows with Dr. Tasia Catchings for his iron deficiency and iron infusions.  Referred to [facility] gastroenterology for AVMs in the small bowel. 06/19/2019: Double-balloon enteroscopy 5 AVMs in the jejunum and 2 AVMs in the duodenum were ablated with APC.  Tattoo was seen in the area.  If further bleeding noted to consider treatment with octreotide.   05/09/2019: Hemoglobin 10.6 g and MCV 75.  Iron  of 18, ferritin of 8  Taking Protonix daily.  Brown-colored stools.  No new concerns.  Current Outpatient Medications  Medication Sig Dispense Refill  . acetaminophen (TYLENOL) 325 MG tablet Take 325 mg by mouth every 6 (six) hours as needed.    Marland Kitchen albuterol (PROVENTIL HFA;VENTOLIN HFA) 108 (90 Base) MCG/ACT inhaler     . ALPRAZolam (XANAX) 0.25 MG tablet Take by mouth 2 (two) times daily as needed.     Marland Kitchen amLODipine-benazepril (LOTREL) 5-20 MG capsule Take 1 capsule by mouth daily.    Marland Kitchen apixaban (ELIQUIS) 2.5 MG TABS tablet Take by mouth.    Marland Kitchen atorvastatin (LIPITOR) 80 MG tablet Take by mouth.    . benazepril (LOTENSIN) 20 MG tablet Take 20 mg by mouth daily.    . benzonatate (TESSALON) 100 MG capsule Take 100 mg by mouth 3 (three) times daily.    . Butalbital-APAP-Caffeine (FIORICET) 50-300-40 MG CAPS Take 1 capsule every four to six hours as needed    . Cholecalciferol 1.25 MG (50000 UT) capsule Take by mouth.    . clopidogrel (PLAVIX) 75 MG tablet Take by mouth.    . colchicine 0.6 MG tablet Take by mouth.    . ezetimibe (ZETIA) 10 MG tablet Take 1 tablet by mouth 1 day or 1 dose.    . fexofenadine (ALLEGRA) 180 MG tablet Take by mouth.    . fluticasone (FLONASE) 50 MCG/ACT nasal spray Place 2 sprays into both nostrils daily as needed for rhinitis.     . Fluticasone Furoate-Vilanterol (BREO ELLIPTA) 100-25 MCG/INH AEPB Inhale 1 puff into the lungs daily.     Marland Kitchen  folic acid (FOLVITE) 1 MG tablet Take by mouth.    . furosemide (LASIX) 20 MG tablet Take 20 mg by mouth daily.    Marland Kitchen gabapentin (NEURONTIN) 400 MG capsule Take by mouth.    Marland Kitchen glucose blood (ONETOUCH ULTRA) test strip     . hydroxychloroquine (PLAQUENIL) 200 MG tablet Take by mouth.    . isosorbide mononitrate (IMDUR) 30 MG 24 hr tablet Take 30 mg by mouth daily.    Marland Kitchen loperamide (IMODIUM) 2 MG capsule Take 1 capsule (2 mg total) by mouth See admin instructions. With onset of diarrhea, take 17m followed by 253mevery 2 hours  resolved. Maximum: 16 mg/day 120 capsule 0  . metFORMIN (GLUCOPHAGE) 500 MG tablet Take 500 mg by mouth daily with breakfast.     . metoprolol succinate (TOPROL-XL) 25 MG 24 hr tablet Take 25 mg by mouth daily.    . ondansetron (ZOFRAN) 8 MG tablet Take 1 tablet (8 mg total) 2 (two) times daily as needed by mouth (Nausea or vomiting). 30 tablet 1  . pantoprazole (PROTONIX) 20 MG tablet Take 20 mg by mouth daily.    . pantoprazole (PROTONIX) 40 MG tablet Take 1 tablet (40 mg total) by mouth 2 (two) times daily. 60 tablet 3  . tiZANidine (ZANAFLEX) 2 MG tablet Take 2 mg by mouth 3 (three) times daily.    . traMADol-acetaminophen (ULTRACET) 37.5-325 MG tablet     . triamcinolone cream (KENALOG) 0.1 % Apply 1 application topically 2 (two) times daily.    . Marland KitchenNABLE TO FIND SMARTSIG:1 Capsule(s) By Mouth Once a Week     No current facility-administered medications for this visit.    Allergies as of 07/10/2019  . (No Known Allergies)    ROS:  General: Negative for anorexia, weight loss, fever, chills, fatigue, weakness. ENT: Negative for hoarseness, difficulty swallowing , nasal congestion. CV: Negative for chest pain, angina, palpitations, dyspnea on exertion, peripheral edema.  Respiratory: Negative for dyspnea at rest, dyspnea on exertion, cough, sputum, wheezing.  GI: See history of present illness. GU:  Negative for dysuria, hematuria, urinary incontinence, urinary frequency, nocturnal urination.  Endo: Negative for unusual weight change.    Physical Examination:   There were no vitals taken for this visit.  General: Well-nourished, well-developed in no acute distress.  Eyes: No icterus. Conjunctivae pink. Neuro: Alert and oriented x 3.  Grossly intact. Skin: Warm and dry, no jaundice.   Psych: Alert and cooperative, normal mood and affect.   Imaging Studies: No results found.  Assessment and Plan:   Omar BLANKENSHIPs a [age].o. y/o male here to follow-up for iron  deficiency anemia. H/o multple myeloma, radiation proctitis, CKD.  [facility].  Last performed in January 2021.  Plan as per [facility] if he has evidence of further bleeding to consider octreotide injections.  He already follows with Dr. YuTasia Catchingsn hematology for iron infusions and for his myeloma and could consider Sandostatin injections.   Dr KiJonathon BellowsMD,MRCP (UShelby [facility] Surgery Center LLCFollow up in as needed

## 2019-07-12 ENCOUNTER — Telehealth: Payer: Self-pay

## 2019-07-12 NOTE — Telephone Encounter (Signed)
Patient called wanting to move MD/Venofer on 2/8 to another day. Please reschedule to another day in the same week. Inform pt of new appt details. Thank you

## 2019-07-13 ENCOUNTER — Inpatient Hospital Stay: Payer: Medicare PPO

## 2019-07-16 ENCOUNTER — Inpatient Hospital Stay: Payer: Medicare PPO

## 2019-07-16 ENCOUNTER — Inpatient Hospital Stay: Payer: Medicare PPO | Admitting: Oncology

## 2019-07-20 ENCOUNTER — Inpatient Hospital Stay: Payer: Medicare PPO | Attending: Oncology

## 2019-07-20 ENCOUNTER — Other Ambulatory Visit: Payer: Self-pay

## 2019-07-20 DIAGNOSIS — R5383 Other fatigue: Secondary | ICD-10-CM | POA: Insufficient documentation

## 2019-07-20 DIAGNOSIS — C9001 Multiple myeloma in remission: Secondary | ICD-10-CM | POA: Diagnosis present

## 2019-07-20 DIAGNOSIS — M069 Rheumatoid arthritis, unspecified: Secondary | ICD-10-CM | POA: Diagnosis not present

## 2019-07-20 DIAGNOSIS — Z7951 Long term (current) use of inhaled steroids: Secondary | ICD-10-CM | POA: Insufficient documentation

## 2019-07-20 DIAGNOSIS — N183 Chronic kidney disease, stage 3 unspecified: Secondary | ICD-10-CM | POA: Diagnosis not present

## 2019-07-20 DIAGNOSIS — D500 Iron deficiency anemia secondary to blood loss (chronic): Secondary | ICD-10-CM | POA: Diagnosis present

## 2019-07-20 DIAGNOSIS — Q2730 Arteriovenous malformation, site unspecified: Secondary | ICD-10-CM | POA: Diagnosis not present

## 2019-07-20 DIAGNOSIS — E119 Type 2 diabetes mellitus without complications: Secondary | ICD-10-CM | POA: Diagnosis not present

## 2019-07-20 DIAGNOSIS — K219 Gastro-esophageal reflux disease without esophagitis: Secondary | ICD-10-CM | POA: Diagnosis not present

## 2019-07-20 DIAGNOSIS — I129 Hypertensive chronic kidney disease with stage 1 through stage 4 chronic kidney disease, or unspecified chronic kidney disease: Secondary | ICD-10-CM | POA: Diagnosis not present

## 2019-07-20 DIAGNOSIS — Z79899 Other long term (current) drug therapy: Secondary | ICD-10-CM | POA: Insufficient documentation

## 2019-07-20 DIAGNOSIS — Z7901 Long term (current) use of anticoagulants: Secondary | ICD-10-CM | POA: Insufficient documentation

## 2019-07-20 DIAGNOSIS — E785 Hyperlipidemia, unspecified: Secondary | ICD-10-CM | POA: Insufficient documentation

## 2019-07-20 DIAGNOSIS — K5521 Angiodysplasia of colon with hemorrhage: Secondary | ICD-10-CM

## 2019-07-20 DIAGNOSIS — F17210 Nicotine dependence, cigarettes, uncomplicated: Secondary | ICD-10-CM | POA: Diagnosis not present

## 2019-07-20 DIAGNOSIS — Z950 Presence of cardiac pacemaker: Secondary | ICD-10-CM | POA: Diagnosis not present

## 2019-07-20 DIAGNOSIS — Z7902 Long term (current) use of antithrombotics/antiplatelets: Secondary | ICD-10-CM | POA: Diagnosis not present

## 2019-07-20 DIAGNOSIS — F419 Anxiety disorder, unspecified: Secondary | ICD-10-CM | POA: Diagnosis not present

## 2019-07-20 DIAGNOSIS — I4891 Unspecified atrial fibrillation: Secondary | ICD-10-CM | POA: Diagnosis not present

## 2019-07-20 DIAGNOSIS — D631 Anemia in chronic kidney disease: Secondary | ICD-10-CM | POA: Diagnosis not present

## 2019-07-20 DIAGNOSIS — Z8546 Personal history of malignant neoplasm of prostate: Secondary | ICD-10-CM | POA: Diagnosis not present

## 2019-07-20 DIAGNOSIS — Z7984 Long term (current) use of oral hypoglycemic drugs: Secondary | ICD-10-CM | POA: Insufficient documentation

## 2019-07-20 DIAGNOSIS — Z833 Family history of diabetes mellitus: Secondary | ICD-10-CM | POA: Diagnosis not present

## 2019-07-20 DIAGNOSIS — Z8249 Family history of ischemic heart disease and other diseases of the circulatory system: Secondary | ICD-10-CM | POA: Diagnosis not present

## 2019-07-20 LAB — IRON AND TIBC
Iron: 16 ug/dL — ABNORMAL LOW (ref 45–182)
Saturation Ratios: 4 % — ABNORMAL LOW (ref 17.9–39.5)
TIBC: 420 ug/dL (ref 250–450)
UIBC: 404 ug/dL

## 2019-07-20 LAB — CBC WITH DIFFERENTIAL/PLATELET
Abs Immature Granulocytes: 0.02 10*3/uL (ref 0.00–0.07)
Basophils Absolute: 0 10*3/uL (ref 0.0–0.1)
Basophils Relative: 1 %
Eosinophils Absolute: 0.2 10*3/uL (ref 0.0–0.5)
Eosinophils Relative: 2 %
HCT: 36.2 % — ABNORMAL LOW (ref 39.0–52.0)
Hemoglobin: 10.2 g/dL — ABNORMAL LOW (ref 13.0–17.0)
Immature Granulocytes: 0 %
Lymphocytes Relative: 11 %
Lymphs Abs: 0.9 10*3/uL (ref 0.7–4.0)
MCH: 21.1 pg — ABNORMAL LOW (ref 26.0–34.0)
MCHC: 28.2 g/dL — ABNORMAL LOW (ref 30.0–36.0)
MCV: 74.9 fL — ABNORMAL LOW (ref 80.0–100.0)
Monocytes Absolute: 0.4 10*3/uL (ref 0.1–1.0)
Monocytes Relative: 5 %
Neutro Abs: 7.1 10*3/uL (ref 1.7–7.7)
Neutrophils Relative %: 81 %
Platelets: 210 10*3/uL (ref 150–400)
RBC: 4.83 MIL/uL (ref 4.22–5.81)
RDW: 21 % — ABNORMAL HIGH (ref 11.5–15.5)
WBC: 8.6 10*3/uL (ref 4.0–10.5)
nRBC: 0 % (ref 0.0–0.2)

## 2019-07-20 LAB — FERRITIN: Ferritin: 7 ng/mL — ABNORMAL LOW (ref 24–336)

## 2019-07-22 LAB — KAPPA/LAMBDA LIGHT CHAINS
Kappa free light chain: 113.1 mg/L — ABNORMAL HIGH (ref 3.3–19.4)
Kappa, lambda light chain ratio: 3.44 — ABNORMAL HIGH (ref 0.26–1.65)
Lambda free light chains: 32.9 mg/L — ABNORMAL HIGH (ref 5.7–26.3)

## 2019-07-24 ENCOUNTER — Inpatient Hospital Stay: Payer: Medicare PPO

## 2019-07-24 ENCOUNTER — Other Ambulatory Visit: Payer: Self-pay

## 2019-07-24 ENCOUNTER — Inpatient Hospital Stay (HOSPITAL_BASED_OUTPATIENT_CLINIC_OR_DEPARTMENT_OTHER): Payer: Medicare PPO | Admitting: Oncology

## 2019-07-24 ENCOUNTER — Encounter: Payer: Self-pay | Admitting: Oncology

## 2019-07-24 VITALS — BP 115/74 | HR 88 | Temp 95.1°F | Resp 16 | Wt 192.5 lb

## 2019-07-24 DIAGNOSIS — D631 Anemia in chronic kidney disease: Secondary | ICD-10-CM

## 2019-07-24 DIAGNOSIS — Z7984 Long term (current) use of oral hypoglycemic drugs: Secondary | ICD-10-CM

## 2019-07-24 DIAGNOSIS — Z833 Family history of diabetes mellitus: Secondary | ICD-10-CM

## 2019-07-24 DIAGNOSIS — Z79899 Other long term (current) drug therapy: Secondary | ICD-10-CM

## 2019-07-24 DIAGNOSIS — F419 Anxiety disorder, unspecified: Secondary | ICD-10-CM

## 2019-07-24 DIAGNOSIS — D500 Iron deficiency anemia secondary to blood loss (chronic): Secondary | ICD-10-CM | POA: Diagnosis not present

## 2019-07-24 DIAGNOSIS — K219 Gastro-esophageal reflux disease without esophagitis: Secondary | ICD-10-CM

## 2019-07-24 DIAGNOSIS — Z950 Presence of cardiac pacemaker: Secondary | ICD-10-CM

## 2019-07-24 DIAGNOSIS — I4891 Unspecified atrial fibrillation: Secondary | ICD-10-CM

## 2019-07-24 DIAGNOSIS — Z8546 Personal history of malignant neoplasm of prostate: Secondary | ICD-10-CM

## 2019-07-24 DIAGNOSIS — Z7901 Long term (current) use of anticoagulants: Secondary | ICD-10-CM

## 2019-07-24 DIAGNOSIS — I129 Hypertensive chronic kidney disease with stage 1 through stage 4 chronic kidney disease, or unspecified chronic kidney disease: Secondary | ICD-10-CM

## 2019-07-24 DIAGNOSIS — N183 Chronic kidney disease, stage 3 unspecified: Secondary | ICD-10-CM

## 2019-07-24 DIAGNOSIS — Z8249 Family history of ischemic heart disease and other diseases of the circulatory system: Secondary | ICD-10-CM

## 2019-07-24 DIAGNOSIS — Z7951 Long term (current) use of inhaled steroids: Secondary | ICD-10-CM

## 2019-07-24 DIAGNOSIS — Z7902 Long term (current) use of antithrombotics/antiplatelets: Secondary | ICD-10-CM

## 2019-07-24 DIAGNOSIS — D509 Iron deficiency anemia, unspecified: Secondary | ICD-10-CM

## 2019-07-24 DIAGNOSIS — E119 Type 2 diabetes mellitus without complications: Secondary | ICD-10-CM

## 2019-07-24 DIAGNOSIS — E785 Hyperlipidemia, unspecified: Secondary | ICD-10-CM

## 2019-07-24 DIAGNOSIS — C9001 Multiple myeloma in remission: Secondary | ICD-10-CM

## 2019-07-24 DIAGNOSIS — R5383 Other fatigue: Secondary | ICD-10-CM

## 2019-07-24 DIAGNOSIS — F17210 Nicotine dependence, cigarettes, uncomplicated: Secondary | ICD-10-CM

## 2019-07-24 DIAGNOSIS — Q2730 Arteriovenous malformation, site unspecified: Secondary | ICD-10-CM

## 2019-07-24 DIAGNOSIS — M069 Rheumatoid arthritis, unspecified: Secondary | ICD-10-CM

## 2019-07-24 LAB — MULTIPLE MYELOMA PANEL, SERUM
Albumin SerPl Elph-Mcnc: 3.6 g/dL (ref 2.9–4.4)
Albumin/Glob SerPl: 1 (ref 0.7–1.7)
Alpha 1: 0.2 g/dL (ref 0.0–0.4)
Alpha2 Glob SerPl Elph-Mcnc: 0.8 g/dL (ref 0.4–1.0)
B-Globulin SerPl Elph-Mcnc: 1 g/dL (ref 0.7–1.3)
Gamma Glob SerPl Elph-Mcnc: 1.8 g/dL (ref 0.4–1.8)
Globulin, Total: 3.8 g/dL (ref 2.2–3.9)
IgA: 230 mg/dL (ref 61–437)
IgG (Immunoglobin G), Serum: 2003 mg/dL — ABNORMAL HIGH (ref 603–1613)
IgM (Immunoglobulin M), Srm: 98 mg/dL (ref 15–143)
Total Protein ELP: 7.4 g/dL (ref 6.0–8.5)

## 2019-07-24 MED ORDER — IRON SUCROSE 20 MG/ML IV SOLN
200.0000 mg | Freq: Once | INTRAVENOUS | Status: AC
  Administered 2019-07-24: 200 mg via INTRAVENOUS
  Filled 2019-07-24: qty 10

## 2019-07-24 MED ORDER — SODIUM CHLORIDE 0.9 % IV SOLN
INTRAVENOUS | Status: DC
  Filled 2019-07-24: qty 250

## 2019-07-24 NOTE — Progress Notes (Signed)
Hematology/Oncology Follow Up Note [facility] [phone] [phone]  [name] Care Team: Jodi Marble, MD as PCP - General (Internal Medicine) Bary Castilla, Forest Gleason, MD (General Surgery) Edrick Kins, MD as Rounding Team (Internal Medicine) Hillary Bow, MD (Inactive) as Consulting Physician (Internal Medicine) [name], MD as Surgeon (Gastroenterology) Isaias Cowman, MD as Consulting Physician (Cardiology) Earlie Server, MD as Consulting Physician (Oncology)  REFERRING PROVIDER: Dr.Lateef, [place]  REASON FOR VISIT Follow up for management of plasma cell dyscrasia.  And iron deficiency anemia.   HISTORY OF PRESENTING ILLNESS:  79 y.o.  male with PMH listed below who presents to follow up on the evaluation and management of his abnormal urine protein electrophoresis results. I reviewed the records from [facility], [place] and [place] was performed and HemOnc related medical problems are listed below.  He lives with a friend. He has 3 adult children.   1 Chronic Kidney disease, Stage III: [name] follows up with Dr.Lateef.  2 Proteinuria:  02/18/2017: Albumin/Creatinine ratio was 7, Urine protein electrophoresis,Random Urine revealed M Spike 43.2%, total protein of 43.65m/dl,  01/05/2017 Albumin/Creatinine ratio was 455.2, Albumin 996.5, , 2.15.2018 Albumin/Creatinine ratio was 80.3, Albumin 73.3, Urine protein electrophoresis,Random Urine revealed M Spike 29.7%, total protein of 29.190mdl, 06/30/2016 Serum protein electrophoresis did not detect M spike.  Autoimmune disorder work up showed Negative anti dsDNA, RNP antibodies, smith antibody, Sjogren antibodies, anti Jo-1, positive antichromotin antibodies, positive ANA,  3 Anemia of chronic kidney disease:  4 Iron deficiency anemia: history of iron deficiency, ferritin was 7 on 08/11/2016. He had EGD and colonoscopy that were done  this year which showed duodenitis/esphagitis/diverticulosis/benigh polyps. # [facility]  5 Rheumatoid arthritis: he follows up with Dr.Kernodle and is on chronic MTX. He Is currently off MTX reports joint pains are controlled, not quite symptomatic.   [name] denies any persistent bone pain or any pain. He continue to feel lack of energy, persistent, not improved with resting or taking naps. He also has lost 25 pounds in the past year which was unintentional.   # Rheumatoid arthritis and currently is off MTX.  # It is ambiguous whether he has truly symptomatic multiple myeloma or a smoldering myeloma, given that anemia can be secondary to iron deficiency and CKD.  The hypermetabolic 2.1 cm hypermetabolic soft tissue lesion in the left heel, can reflect a Plasmacytoma,which should have been biopsied. However [name] initially denies being on any blood thinner, later he was found to be on Plavix with pre biopsy questionnaire screening, and biopsy was held. [name] has to contact cardiologist to hold plavix for seven days before procedure. He feels frustrated about multiple workup and meanwhile he becomes more fatigues, with worsening of kidney function. [name] was reluctant about proceeding additional invasive procedures and wants to start treatment. Decision was made to start active MM treatment.   # [facility]. He is not considered to  good candidate for transplant. [name] cardiologist Dr. KaChancy Milroyad started [name] on Lasix 20 mg daily. [facility] as [name] was having dizziness and borderline blood pressure during his clinic visit there.  10/02/2018 He had follow-up left foot/ankle x-ray as a follow-up of his left heel lesion which was initially presented on PET scan and not detectable on follow-up PET scan.  Images were reviewed by me.  No  suspicious acute or subacute osseous abnormality  Current Treatment:  S/p RVD x 4 cycles tolerates well except cytopenia.  Revlimid 10 mg PO once per day on days 1 to 14 (dose reduced due to GFR) Revlimid renal dosing of 36m,  Velcade 1.3 mg/m2 IV once per day on days 1,  8, 15 Dexamethasone 20 mg PO once per day on days 1, 8, 15 given [name]'s diabetes Given [name]'s multiple comorbidity and the side effects of cytopenia during his RVD treatment and the uncertainty of this is really a site of plasmacytoma or not, I recommend not to continue him on maintenance Revlimid.   INTERVAL HISTORY [name] presents for follow-up of management of multiple myeloma/smoldering multiple myeloma and iron deficiency anemia.[name] is not currently on any maintenance treatments. [name] post 06/19/2019 double-balloon enteroscopy with findings of 5 AVMs in the jejunum and 2 AVMs in the duodenum and AVMs were ablated with APC.  Tattoo was seen in the area. If further bleeding, recommend to consider treatment with octreotide. [name] takes Protonix daily.  He denies any bloody or dark stool. He has chronic fatigue which he feels is at baseline. [name] is on Eliquis 2.5 mg twice daily for  [name] continues to feel fatigued, improved compared to previously [name] has repeat small bowel capsule study on 02/16/2019 Which showed multiple AVMs seen in the mid jejunum proximal to the site of prior tattoo.  [name] is on Eliquis 2.5 mg twice daily for atrial fibrillation. [name] has rheumatoid arthritis and currently not on methotrexate. Back pain, [name] was seen by Dr. KJefm Bryantrheumatology and was recommended to have x-ray lumbar sacral spine.  Results not available to me in [place]   Review of Systems  Constitutional: Positive for fatigue. Negative for appetite change, chills, fever and unexpected weight change.  HENT:   Negative for hearing loss  and voice change.   Eyes: Negative for eye problems and icterus.  Respiratory: Negative for chest tightness, cough and shortness of breath.   Cardiovascular: Negative for chest pain and leg swelling.  Gastrointestinal: Negative for abdominal distention, abdominal pain and diarrhea.  Endocrine: Negative for hot flashes.  Genitourinary: Negative for difficulty urinating, dysuria and frequency.   Musculoskeletal: Negative for arthralgias.  Skin: Negative for itching and rash.  Neurological: Negative for light-headedness and numbness.  Hematological: Negative for adenopathy. Does not bruise/bleed easily.  Psychiatric/Behavioral: Negative for confusion.     MEDICAL HISTORY:  Past Medical History:  Diagnosis Date  . Anxiety   . Arthritis    rheumatoid  . Atrial fibrillation (HCC)    hx of  . Cancer ([place]   . Chronic kidney disease   . Colon polyp 07-07-15   TUBULAR ADENOMA WITH AT LEAST HIGH-GRADE / Dr RRayann Heman . Diabetes mellitus without complication ([place]   . Dysrhythmia    bradycardia....Marland KitchenMarland Kitchen to 1 heart block  . GERD (gastroesophageal reflux disease)   . Heart murmur    [name] unaware of history of murmur  . Hypercholesterolemia   . Hypertension   . Presence of permanent cardiac pacemaker   . Prostate cancer ([place] 01/01/13, 01/30/14   Gleason 3+4=7, volume 46.6 cc  . Rheumatoid arthritis ([place]   . S/P radiation therapy  04/03/2014 through 06/04/2014  Prostate 7800 cGy in 40 sessions                          . Sleep apnea    uses cpap but it is not in the hospital with him    SURGICAL HISTORY: Past Surgical History:  Procedure Laterality Date  . ABDOMINAL AORTIC ANEURYSM REPAIR  11/2013  . COLON SURGERY  March 2017   Right hemicolectomy for tubulovillous adenoma with high-grade dysplasia.  . COLONOSCOPY WITH PROPOFOL N/A 07/07/2015   Procedure: COLONOSCOPY WITH PROPOFOL;  Surgeon: Josefine Class, MD;  Location[facility]  ENDOSCOPY;  Service: Endoscopy;  Laterality: N/A;  . COLONOSCOPY WITH PROPOFOL N/A 10/28/2016   Procedure: COLONOSCOPY WITH PROPOFOL;  Surgeon: [name], MD;  Location[facility] ENDOSCOPY;  Service: Endoscopy;  Laterality: N/A;  . ESOPHAGOGASTRODUODENOSCOPY (EGD) WITH PROPOFOL N/A 10/28/2016   Procedure: ESOPHAGOGASTRODUODENOSCOPY (EGD) WITH PROPOFOL;  Surgeon: [name], MD;  Location: [facility] ENDOSCOPY;  Service: Endoscopy;  Laterality: N/A;  . GIVENS CAPSULE STUDY N/A 07/25/2017   Procedure: GIVENS CAPSULE STUDY;  Surgeon: [name], MD;  Location: [facility] ENDOSCOPY;  Service: Gastroenterology;  Laterality: N/A;  . GIVENS CAPSULE STUDY N/A 01/29/2019   Procedure: GIVENS CAPSULE STUDY;  Surgeon: [name], MD;  Location[facility] ENDOSCOPY;  Service: Gastroenterology;  Laterality: N/A;  . LAPAROSCOPIC RIGHT COLECTOMY Right 08/08/2015   Procedure: LAPAROSCOPIC RIGHT COLECTOMY;  Surgeon: Robert Bellow, MD;  Location: [facility] ORS;  Service: General;  Laterality: Right;  . NASAL SINUS SURGERY    . PACEMAKER INSERTION Left 08/26/2016   Procedure: INSERTION PACEMAKER;  Surgeon: Isaias Cowman, MD;  Location: [facility] ORS;  Service: Cardiovascular;  Laterality: Left;  . PROSTATE BIOPSY  01/01/13, 01/30/14   Gleason 3+3=6, vol 46.6 cc  . TONSILLECTOMY    . uvula surgery     for sleep apnea    SOCIAL HISTORY: Social History   Socioeconomic History  . Marital status: Divorced    Spouse name: Not on file  . Number of children: Not on file  . Years of education: Not on file  . Highest education level: Not on file  Occupational History  . Not on file  Tobacco Use  . Smoking status: Current Every Day Smoker    Packs/day: 1.00    Years: 60.00    Pack years: 60.00    Types: Cigarettes  . Smokeless tobacco: Never Used  . Tobacco comment: DOWN TO 1/2 PPD  Substance and Sexual Activity  . Alcohol use: Yes    Alcohol/week: 1.0 standard drinks    Types: 1 Cans of beer per week    Comment: occasionally    . Drug use: No  . Sexual activity: Not Currently  Other Topics Concern  . Not on file  Social History Narrative  . Not on file   Social Determinants of Health   Financial Resource Strain:   . Difficulty of Paying Living Expenses: Not on file  Food Insecurity:   . Worried About Charity fundraiser in the Last Year: Not on file  . Ran Out of Food in the Last Year: Not on file  Transportation Needs:   . Lack of Transportation (Medical): Not on file  . Lack of Transportation (Non-Medical): Not on file  Physical Activity:   . Days of Exercise per Week: Not on file  . Minutes of Exercise per Session: Not on file  Stress:   . Feeling of Stress : Not on file  Social  Connections:   . Frequency of Communication with Friends and Family: Not on file  . Frequency of Social Gatherings with Friends and Family: Not on file  . Attends Religious Services: Not on file  . Active Member of Clubs or Organizations: Not on file  . Attends Archivist Meetings: Not on file  . Marital Status: Not on file  Intimate Partner Violence:   . Fear of Current or Ex-Partner: Not on file  . Emotionally Abused: Not on file  . Physically Abused: Not on file  . Sexually Abused: Not on file    FAMILY HISTORY: Family History  Problem Relation Age of Onset  . Heart attack Mother   . Cirrhosis Father   . Pancreatic cancer Brother   . Diabetes Brother   . Diabetes Daughter        medication induced for cancer treatments  . Cancer Daughter        breast, brain    ALLERGIES:  has No Known Allergies.  MEDICATIONS:  Current Outpatient Medications  Medication Sig Dispense Refill  . acetaminophen (TYLENOL) 325 MG tablet Take 325 mg by mouth every 6 (six) hours as needed.    Marland Kitchen albuterol (PROVENTIL HFA;VENTOLIN HFA) 108 (90 Base) MCG/ACT inhaler     . ALPRAZolam (XANAX) 0.25 MG tablet Take by mouth 2 (two) times daily as needed.     Marland Kitchen amLODipine-benazepril (LOTREL) 5-20 MG capsule Take 1 capsule by  mouth daily.    Marland Kitchen apixaban (ELIQUIS) 2.5 MG TABS tablet Take by mouth.    Marland Kitchen atorvastatin (LIPITOR) 80 MG tablet Take by mouth.    . benazepril (LOTENSIN) 20 MG tablet Take 20 mg by mouth daily.    . benzonatate (TESSALON) 100 MG capsule Take 100 mg by mouth 3 (three) times daily.    . Butalbital-APAP-Caffeine (FIORICET) 50-300-40 MG CAPS Take 1 capsule every four to six hours as needed    . Cholecalciferol 1.25 MG (50000 UT) capsule Take by mouth.    . clopidogrel (PLAVIX) 75 MG tablet Take by mouth.    . colchicine 0.6 MG tablet Take by mouth.    . ezetimibe (ZETIA) 10 MG tablet Take 1 tablet by mouth 1 day or 1 dose.    . fexofenadine (ALLEGRA) 180 MG tablet Take by mouth.    . fluticasone (FLONASE) 50 MCG/ACT nasal spray Place 2 sprays into both nostrils daily as needed for rhinitis.     . Fluticasone Furoate-Vilanterol (BREO ELLIPTA) 100-25 MCG/INH AEPB Inhale 1 puff into the lungs daily.     Marland Kitchen gabapentin (NEURONTIN) 400 MG capsule Take by mouth.    Marland Kitchen glucose blood (ONETOUCH ULTRA) test strip     . hydroxychloroquine (PLAQUENIL) 200 MG tablet Take by mouth.    . isosorbide mononitrate (IMDUR) 30 MG 24 hr tablet Take 30 mg by mouth daily.    . metFORMIN (GLUCOPHAGE) 500 MG tablet Take 500 mg by mouth daily with breakfast.     . metoprolol succinate (TOPROL-XL) 25 MG 24 hr tablet Take 25 mg by mouth daily.    Marland Kitchen tiZANidine (ZANAFLEX) 2 MG tablet Take 2 mg by mouth 3 (three) times daily.    . traMADol-acetaminophen (ULTRACET) 37.5-325 MG tablet     . triamcinolone cream (KENALOG) 0.1 % Apply 1 application topically 2 (two) times daily.    Marland Kitchen UNABLE TO FIND SMARTSIG:1 Capsule(s) By Mouth Once a Week    . folic acid (FOLVITE) 1 MG tablet Take by mouth.    Marland Kitchen  furosemide (LASIX) 20 MG tablet Take 20 mg by mouth daily.    Marland Kitchen loperamide (IMODIUM) 2 MG capsule Take 1 capsule (2 mg total) by mouth See admin instructions. With onset of diarrhea, take 68m followed by 284mevery 2 hours resolved. Maximum:  16 mg/day ([name] not taking: Reported on 07/24/2019) 120 capsule 0  . ondansetron (ZOFRAN) 8 MG tablet Take 1 tablet (8 mg total) 2 (two) times daily as needed by mouth (Nausea or vomiting). ([name] not taking: Reported on 07/24/2019) 30 tablet 1  . pantoprazole (PROTONIX) 20 MG tablet Take 20 mg by mouth daily.    . pantoprazole (PROTONIX) 40 MG tablet Take 1 tablet (40 mg total) by mouth 2 (two) times daily. ([name] not taking: Reported on 07/10/2019) 60 tablet 3   No current facility-administered medications for this visit.      . Marland KitchenPHYSICAL EXAMINATION: ECOG PERFORMANCE STATUS: 1 - Symptomatic but completely ambulatory Vitals:   07/24/19 1448  BP: 115/74  Pulse: 88  Resp: 16  Temp: (!) 95.1 F (35.1 C)   Filed Weights   07/24/19 1448  Weight: 192 lb 8 oz (87.3 kg)   Physical Exam  Constitutional: He is oriented to person, place, and time and well-developed, well-nourished, and in no distress. No distress.  HENT:  Head: Normocephalic and atraumatic.  Mouth/Throat: No oropharyngeal exudate.  Eyes: Pupils are equal, round, and reactive to light. EOM are normal. Right eye exhibits no discharge. Left eye exhibits no discharge. No scleral icterus.  Neck: No JVD present.  Cardiovascular: Normal rate, regular rhythm and normal heart sounds. Exam reveals no friction rub.  No murmur heard. Pulmonary/Chest: Effort normal and breath sounds normal. No respiratory distress. He has no wheezes. He has no rales. He exhibits no tenderness.  Abdominal: Soft. Bowel sounds are normal. He exhibits no distension and no mass. There is no abdominal tenderness.  Musculoskeletal:        General: No tenderness or edema. Normal range of motion.     Cervical back: Normal range of motion and neck supple.  Lymphadenopathy:    He has no cervical adenopathy.  Neurological: He is alert and oriented to person, place, and time.  Skin: Skin is warm and dry. He is not diaphoretic. No erythema.    Hypopigmentation of skin  Psychiatric: Affect normal.    LABORATORY DATA:  I have reviewed the data as listed Lab Results  Component Value Date   WBC 8.6 07/20/2019   HGB 10.2 (L) 07/20/2019   HCT 36.2 (L) 07/20/2019   MCV 74.9 (L) 07/20/2019   PLT 210 07/20/2019   Recent Labs    09/22/18 1108 09/29/18 1133 12/18/18 1344  NA 139 135 138  K 4.3 4.2 4.3  CL 109 107 111  CO2 '24 22 23  ' GLUCOSE 100* 90 95  BUN '17 18 22  ' CREATININE 1.53* 1.24 1.49*  CALCIUM 8.8* 8.9 8.7*  GFRNONAA Omar* 56* 45*  GFRAA 50* >60 52*  PROT 8.6*  --  8.1  ALBUMIN 3.7  --  3.2*  AST 15  --  14*  ALT 12  --  14  ALKPHOS 67  --  58  BILITOT 0.4  --  0.4    SPEP: no M spike Serum free light chain Kappa/Lamda ratio 13.04 UPEP  Free light chain Kappa/lamda ratio 162.43, M spike 19850m4 hour   Bone marrow biopsy 03/21/2017  Bone Marrow, Aspirate,Biopsy, and Clot, left iliac - HYPERCELLULAR BONE MARROW FOR AGE WITH TRILINEAGE HEMATOPOIESIS. -  PLASMACYTOSIS (PLASMA CELLS 8%)  Karyotype: loss of Y chromosome Cytogenetic MDS FISH Panel negative.   Bone marrow 04/06/2017  Bone Marrow, Aspirate,Biopsy, and Clot, right iliac and core - HYPERCELLULAR BONE MARROW FOR AGE WITH PLASMA CELL NEOPLASM - TRILINEAGE HEMATOPOIESIS. - SEE COMMENT. PERIPHERAL BLOOD: - MICROCYTIC-HYPOCHROMIC ANEMIA. Diagnosis Note The bone marrow is hypercellular with trilineage hematopoiesis but with relative abundance of erythroid precursors and increased number of megakaryocytes with nonspecific changes. Significant dyspoiesis is not seen. Iron stores are present with no ring sideroblasts. The plasma cells are increased in number representing 8% of all cells in the aspirate although focal areas in the core biopsy show 10 to 20% as primarily seen by CD138 stain. In situ hybridization for kappa and lambda light chains show kappa light chain restriction consistent with plasma cell neoplasm. Correlation with cytogenetic and  FISH studies is recommended. (BNS:ecj/gt 04/07/2017)  IMAGE STUDIES I have personally reviewed below image results.  03/15/2017 DG bone survey Met: Nonspecific lucent lesion in the distal right ulna measuring 15-16 mm, but otherwise normal bone mineralization for age throughout the visible skeleton. A solitary lytic lesion in a distal extremity would be an unusual presentation of multiple myeloma, and I favor the distal right ulna lesion is benign. Recommend correlation with serum and urine protein electrophoresis.  04/14/2017 PET scan: 1. 2.1 cm hypermetabolic soft tissue lesion in the left heel, adjacent to the calcaneal tuberosity. 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 2. Mottled FDG accumulation diffusely in the marrow space without other frankly overt hypermetabolic bony lesion. 3. Coronary artery and thoracoabdominal aortic atherosclerosis with abdominal aortic stent graft visualized in situ. 4. Bilateral renal cysts and bilateral nonobstructing renal stones.  08/24/2017 PET scan 1. No hypermetabolic osseous lesions. No definite signs of multiple myeloma on CT imaging. 2. Interval improvement in soft tissue activity along the medial aspect of the left calcaneal tuberosity, likely plantar fasciitis. 3. New small left pleural effusion. 4. Bilateral inguinal hernias containing small bowel on the right and sigmoid colon on the left. No evidence of incarceration or obstruction. 5. Otherwise stable incidental findings including diffuse atherosclerosis, bilateral renal cysts, emphysema and sigmoid diverticulosis.  ASSESSMENT & PLAN:  1. Iron deficiency anemia due to chronic blood loss   2. AVM (arteriovenous malformation)   3. Multiple myeloma in remission ([place])   4. History of prostate cancer    #Iron deficiency anemia secondary to chronic blood loss due to multiple AVM in small bowels. Status post enteroscopy at [facility] for AVM ablation. Labs reviewed and discussed with  [name]. Hemoglobin 10.2, MCV 74.9.  Iron panel showed ferritin of 7, iron saturation 4. Discussed with [name] that I suspect that he continues to have some chronic GI bleeding. It is also possible that he was very iron deficient despite multiple IV iron's prior to the enteroscopy procedure. I recommend to proceed with IV Venofer weekly x5 to further improve his iron store. If his iron store and hemoglobin improves, and then drops again, that will indicate chronic ongoing blood loss. And he will need to consider octreotide injections. Also I recommend [name] discuss with primary care provider if anticoagulation with low-dose Eliquis can be stopped.  #Smoldering multiple myeloma/active myeloma if previous hypermetabolic lesion on PET scan was related to myeloma. [name] was treated with 4 cycles of RVD.  He is not currently on any maintenance therapy due to multiple comorbidities, borderline smoldering multiple myeloma status. Multiple myeloma panel results were reviewed and discussed with [name].  Stable disease. #History of  prostate cancer, [name] reports that he is not currently following up with anyone.  I will check PSA at the next visit.  Follow-up in 8 weeks with reevaluation of blood work and MD reassessment.  Orders Placed This Encounter  Procedures  . CBC with Differential/Platelet    Standing Status:   Future    Standing Expiration Date:   07/23/2020  . Ferritin    Standing Status:   Future    Standing Expiration Date:   07/23/2020  . Iron and TIBC    Standing Status:   Future    Standing Expiration Date:   07/23/2020    Earlie Server, MD, PhD Hematology Oncology [facility] at [facility] Pager- [phone] 07/24/2019

## 2019-07-26 ENCOUNTER — Ambulatory Visit: Payer: Medicare Other | Admitting: Podiatry

## 2019-08-02 ENCOUNTER — Inpatient Hospital Stay: Payer: Medicare PPO

## 2019-08-02 ENCOUNTER — Other Ambulatory Visit: Payer: Self-pay

## 2019-08-02 DIAGNOSIS — D500 Iron deficiency anemia secondary to blood loss (chronic): Secondary | ICD-10-CM | POA: Diagnosis not present

## 2019-08-02 DIAGNOSIS — D509 Iron deficiency anemia, unspecified: Secondary | ICD-10-CM

## 2019-08-02 MED ORDER — IRON SUCROSE 20 MG/ML IV SOLN
200.0000 mg | Freq: Once | INTRAVENOUS | Status: AC
  Administered 2019-08-02: 200 mg via INTRAVENOUS
  Filled 2019-08-02: qty 10

## 2019-08-02 MED ORDER — SODIUM CHLORIDE 0.9 % IV SOLN
INTRAVENOUS | Status: DC
  Filled 2019-08-02: qty 250

## 2019-08-09 ENCOUNTER — Inpatient Hospital Stay: Payer: Medicare PPO | Attending: Oncology

## 2019-08-09 DIAGNOSIS — D509 Iron deficiency anemia, unspecified: Secondary | ICD-10-CM | POA: Insufficient documentation

## 2019-08-14 ENCOUNTER — Telehealth: Payer: Self-pay

## 2019-08-14 NOTE — Telephone Encounter (Signed)
-----   Message from Earlie Server, MD sent at 08/14/2019  8:30 AM EST ----- Regarding: RE: missed venofer appt Yes please add one treatment.  ----- Message ----- From: Vanice Sarah, CMA Sent: 08/13/2019  10:16 AM EST To: Vanice Sarah, CMA, Earlie Server, MD Subject: missed venofer appt                            He did not get Venofer last Th.  He is scheduled for Venofer on 3/11 and 3/18.  Do you want to add on another weekly Venofer for the missed appt?

## 2019-08-14 NOTE — Telephone Encounter (Signed)
Done... Missed Venofer appt added on to 08/30/19 @ 130 Called pt and left a detailed message on his vmail to make him aware

## 2019-08-16 ENCOUNTER — Inpatient Hospital Stay: Payer: Medicare PPO

## 2019-08-16 VITALS — BP 98/65 | HR 96 | Temp 96.0°F | Resp 17

## 2019-08-16 DIAGNOSIS — D509 Iron deficiency anemia, unspecified: Secondary | ICD-10-CM | POA: Diagnosis not present

## 2019-08-16 MED ORDER — SODIUM CHLORIDE 0.9 % IV SOLN
INTRAVENOUS | Status: DC
  Filled 2019-08-16: qty 250

## 2019-08-16 MED ORDER — IRON SUCROSE 20 MG/ML IV SOLN
200.0000 mg | Freq: Once | INTRAVENOUS | Status: AC
  Administered 2019-08-16: 200 mg via INTRAVENOUS
  Filled 2019-08-16: qty 10

## 2019-08-23 ENCOUNTER — Inpatient Hospital Stay: Payer: Medicare PPO

## 2019-08-23 ENCOUNTER — Other Ambulatory Visit: Payer: Self-pay

## 2019-08-23 VITALS — BP 120/72 | HR 73 | Temp 96.9°F | Resp 20

## 2019-08-23 DIAGNOSIS — D509 Iron deficiency anemia, unspecified: Secondary | ICD-10-CM

## 2019-08-23 MED ORDER — IRON SUCROSE 20 MG/ML IV SOLN
200.0000 mg | Freq: Once | INTRAVENOUS | Status: AC
  Administered 2019-08-23: 200 mg via INTRAVENOUS
  Filled 2019-08-23: qty 10

## 2019-08-23 MED ORDER — SODIUM CHLORIDE 0.9 % IV SOLN
INTRAVENOUS | Status: DC
  Filled 2019-08-23: qty 250

## 2019-08-30 ENCOUNTER — Inpatient Hospital Stay: Payer: Medicare PPO

## 2019-08-30 VITALS — BP 100/75 | HR 68 | Temp 96.6°F | Resp 18

## 2019-08-30 DIAGNOSIS — D509 Iron deficiency anemia, unspecified: Secondary | ICD-10-CM

## 2019-08-30 MED ORDER — SODIUM CHLORIDE 0.9 % IV SOLN
Freq: Once | INTRAVENOUS | Status: AC
  Filled 2019-08-30: qty 250

## 2019-08-30 MED ORDER — IRON SUCROSE 20 MG/ML IV SOLN
200.0000 mg | Freq: Once | INTRAVENOUS | Status: AC
  Administered 2019-08-30: 200 mg via INTRAVENOUS
  Filled 2019-08-30: qty 10

## 2019-09-06 ENCOUNTER — Other Ambulatory Visit: Payer: Self-pay

## 2019-09-13 ENCOUNTER — Ambulatory Visit: Payer: Medicare PPO | Admitting: Podiatry

## 2019-09-13 ENCOUNTER — Encounter: Payer: Self-pay | Admitting: Podiatry

## 2019-09-13 ENCOUNTER — Other Ambulatory Visit: Payer: Self-pay

## 2019-09-13 DIAGNOSIS — I739 Peripheral vascular disease, unspecified: Secondary | ICD-10-CM | POA: Diagnosis not present

## 2019-09-13 DIAGNOSIS — M79609 Pain in unspecified limb: Secondary | ICD-10-CM | POA: Diagnosis not present

## 2019-09-13 DIAGNOSIS — L84 Corns and callosities: Secondary | ICD-10-CM

## 2019-09-13 DIAGNOSIS — B351 Tinea unguium: Secondary | ICD-10-CM | POA: Diagnosis not present

## 2019-09-13 DIAGNOSIS — D689 Coagulation defect, unspecified: Secondary | ICD-10-CM | POA: Diagnosis not present

## 2019-09-13 NOTE — Progress Notes (Signed)
This patient returns to my office for at risk foot care.  This patient requires this care by a professional since this patient will be at risk due to having PAD and diabetes. And coagulation defect.  Patient is taking eliquiss.  This patient is unable to cut nails himself since the patient cannot reach his nails.These nails are painful walking and wearing shoes.  This patient presents for at risk foot care today.  General Appearance  Alert, conversant and in no acute stress.  Vascular  Dorsalis pedis and posterior tibial  pulses are not  palpable  bilaterally.  Capillary return is within normal limits  bilaterally. Temperature is within normal limits  bilaterally.  Neurologic  Senn-Weinstein monofilament wire test within normal limits  bilaterally. Muscle power within normal limits bilaterally.  Nails Thick disfigured discolored nails with subungual debris  from hallux to fifth toes bilaterally. No evidence of bacterial infection or drainage bilaterally.  Orthopedic  No limitations of motion  feet .  No crepitus or effusions noted.  No bony pathology or digital deformities noted.  HAV wuth hammer toe  B/L.  Rigid contracted second digit right foot.  Skin  normotropic skin with no porokeratosis noted bilaterally.  No signs of infections or ulcers noted.     Onychomycosis  Pain in right toes  Pain in left toes  Consent was obtained for treatment procedures.   Mechanical debridement of nails 1-5  bilaterally performed with a nail nipper.  Filed with dremel without incident.    Return office visit    10 weeks                 Told patient to return for periodic foot care and evaluation due to potential at risk complications.   Donnesha Karg DPM  

## 2019-09-17 ENCOUNTER — Other Ambulatory Visit: Payer: Self-pay

## 2019-09-17 ENCOUNTER — Inpatient Hospital Stay: Payer: Medicare PPO | Attending: Oncology

## 2019-09-17 DIAGNOSIS — Z8546 Personal history of malignant neoplasm of prostate: Secondary | ICD-10-CM | POA: Diagnosis not present

## 2019-09-17 DIAGNOSIS — D500 Iron deficiency anemia secondary to blood loss (chronic): Secondary | ICD-10-CM | POA: Diagnosis present

## 2019-09-17 DIAGNOSIS — I4891 Unspecified atrial fibrillation: Secondary | ICD-10-CM | POA: Insufficient documentation

## 2019-09-17 DIAGNOSIS — K5521 Angiodysplasia of colon with hemorrhage: Secondary | ICD-10-CM | POA: Diagnosis not present

## 2019-09-17 DIAGNOSIS — Q2730 Arteriovenous malformation, site unspecified: Secondary | ICD-10-CM

## 2019-09-17 DIAGNOSIS — C9001 Multiple myeloma in remission: Secondary | ICD-10-CM

## 2019-09-17 DIAGNOSIS — Z7901 Long term (current) use of anticoagulants: Secondary | ICD-10-CM | POA: Diagnosis not present

## 2019-09-17 DIAGNOSIS — C9000 Multiple myeloma not having achieved remission: Secondary | ICD-10-CM | POA: Insufficient documentation

## 2019-09-17 LAB — COMPREHENSIVE METABOLIC PANEL
ALT: 14 U/L (ref 0–44)
AST: 15 U/L (ref 15–41)
Albumin: 3.4 g/dL — ABNORMAL LOW (ref 3.5–5.0)
Alkaline Phosphatase: 55 U/L (ref 38–126)
Anion gap: 6 (ref 5–15)
BUN: 21 mg/dL (ref 8–23)
CO2: 24 mmol/L (ref 22–32)
Calcium: 8.6 mg/dL — ABNORMAL LOW (ref 8.9–10.3)
Chloride: 108 mmol/L (ref 98–111)
Creatinine, Ser: 1.51 mg/dL — ABNORMAL HIGH (ref 0.61–1.24)
GFR calc Af Amer: 51 mL/min — ABNORMAL LOW (ref >60)
GFR calc non Af Amer: 44 mL/min — ABNORMAL LOW (ref >60)
Glucose, Bld: 93 mg/dL (ref 70–99)
Potassium: 4.6 mmol/L (ref 3.5–5.1)
Sodium: 138 mmol/L (ref 135–145)
Total Bilirubin: 0.5 mg/dL (ref 0.3–1.2)
Total Protein: 7.5 g/dL (ref 6.5–8.1)

## 2019-09-17 LAB — CBC WITH DIFFERENTIAL/PLATELET
Abs Immature Granulocytes: 0.05 10*3/uL (ref 0.00–0.07)
Basophils Absolute: 0 10*3/uL (ref 0.0–0.1)
Basophils Relative: 1 %
Eosinophils Absolute: 0.3 10*3/uL (ref 0.0–0.5)
Eosinophils Relative: 3 %
HCT: 38.5 % — ABNORMAL LOW (ref 39.0–52.0)
Hemoglobin: 11.6 g/dL — ABNORMAL LOW (ref 13.0–17.0)
Immature Granulocytes: 1 %
Lymphocytes Relative: 11 %
Lymphs Abs: 0.9 10*3/uL (ref 0.7–4.0)
MCH: 23.8 pg — ABNORMAL LOW (ref 26.0–34.0)
MCHC: 30.1 g/dL (ref 30.0–36.0)
MCV: 78.9 fL — ABNORMAL LOW (ref 80.0–100.0)
Monocytes Absolute: 0.7 10*3/uL (ref 0.1–1.0)
Monocytes Relative: 8 %
Neutro Abs: 6.3 10*3/uL (ref 1.7–7.7)
Neutrophils Relative %: 76 %
Platelets: 250 10*3/uL (ref 150–400)
RBC: 4.88 MIL/uL (ref 4.22–5.81)
RDW: 26.5 % — ABNORMAL HIGH (ref 11.5–15.5)
WBC: 8.2 10*3/uL (ref 4.0–10.5)
nRBC: 0 % (ref 0.0–0.2)

## 2019-09-17 LAB — IRON AND TIBC
Iron: 28 ug/dL — ABNORMAL LOW (ref 45–182)
Saturation Ratios: 9 % — ABNORMAL LOW (ref 17.9–39.5)
TIBC: 321 ug/dL (ref 250–450)
UIBC: 293 ug/dL

## 2019-09-17 LAB — FERRITIN: Ferritin: 39 ng/mL (ref 24–336)

## 2019-09-17 LAB — PSA: Prostatic Specific Antigen: 0.07 ng/mL (ref 0.00–4.00)

## 2019-09-19 ENCOUNTER — Inpatient Hospital Stay (HOSPITAL_BASED_OUTPATIENT_CLINIC_OR_DEPARTMENT_OTHER): Payer: Medicare PPO | Admitting: Oncology

## 2019-09-19 ENCOUNTER — Inpatient Hospital Stay: Payer: Medicare PPO

## 2019-09-19 ENCOUNTER — Telehealth: Payer: Self-pay

## 2019-09-19 ENCOUNTER — Other Ambulatory Visit: Payer: Self-pay

## 2019-09-19 ENCOUNTER — Encounter: Payer: Self-pay | Admitting: Oncology

## 2019-09-19 VITALS — BP 102/67 | HR 73 | Temp 97.1°F | Resp 18 | Wt 189.2 lb

## 2019-09-19 VITALS — BP 129/74 | HR 64 | Resp 18

## 2019-09-19 DIAGNOSIS — C9001 Multiple myeloma in remission: Secondary | ICD-10-CM | POA: Diagnosis not present

## 2019-09-19 DIAGNOSIS — Q2730 Arteriovenous malformation, site unspecified: Secondary | ICD-10-CM | POA: Diagnosis not present

## 2019-09-19 DIAGNOSIS — Z8546 Personal history of malignant neoplasm of prostate: Secondary | ICD-10-CM

## 2019-09-19 DIAGNOSIS — D509 Iron deficiency anemia, unspecified: Secondary | ICD-10-CM

## 2019-09-19 DIAGNOSIS — D500 Iron deficiency anemia secondary to blood loss (chronic): Secondary | ICD-10-CM | POA: Diagnosis not present

## 2019-09-19 MED ORDER — IRON SUCROSE 20 MG/ML IV SOLN
200.0000 mg | Freq: Once | INTRAVENOUS | Status: AC
  Administered 2019-09-19: 200 mg via INTRAVENOUS
  Filled 2019-09-19: qty 10

## 2019-09-19 MED ORDER — SODIUM CHLORIDE 0.9 % IV SOLN
INTRAVENOUS | Status: DC
  Filled 2019-09-19: qty 250

## 2019-09-19 NOTE — Progress Notes (Signed)
Hematology/Oncology Follow Up Note [facility] [phone] [phone]  Patient Care Team: Jodi Marble, MD as PCP - General (Internal Medicine) Bary Castilla, Forest Gleason, MD (General Surgery) Edrick Kins, MD as Rounding Team (Internal Medicine) Hillary Bow, MD (Inactive) as Consulting Physician (Internal Medicine) [name], MD as Surgeon (Gastroenterology) Isaias Cowman, MD as Consulting Physician (Cardiology) Earlie Server, MD as Consulting Physician (Oncology)  REFERRING PROVIDER: Dr.Lateef, [place]  REASON FOR VISIT Follow up for management of plasma cell dyscrasia.  And iron deficiency anemia.   HISTORY OF PRESENTING ILLNESS:  79 y.o.  male with PMH listed below who presents to follow up on the evaluation and management of his abnormal urine protein electrophoresis results. I reviewed the records from [facility], [place] and [place] was performed and HemOnc related medical problems are listed below.  He lives with a friend. He has 3 adult children.   1 Chronic Kidney disease, Stage III: patient follows up with Dr.Lateef.  2 Proteinuria:  02/18/2017: Albumin/Creatinine ratio was 7, Urine protein electrophoresis,Random Urine revealed M Spike 43.2%, total protein of 43.42m/dl,  01/05/2017 Albumin/Creatinine ratio was 455.2, Albumin 996.5, , 2.15.2018 Albumin/Creatinine ratio was 80.3, Albumin 73.3, Urine protein electrophoresis,Random Urine revealed M Spike 29.7%, total protein of 29.134mdl, 06/30/2016 Serum protein electrophoresis did not detect M spike.  Autoimmune disorder work up showed Negative anti dsDNA, RNP antibodies, smith antibody, Sjogren antibodies, anti Jo-1, positive antichromotin antibodies, positive ANA,  3 Anemia of chronic kidney disease:  4 Iron deficiency anemia: history of iron deficiency, ferritin was 7 on 08/11/2016. He had EGD and colonoscopy that were done  this year which showed duodenitis/esphagitis/diverticulosis/benigh polyps. # small bowel AVMs was ablated at [facility]  5 Rheumatoid arthritis: he follows up with Dr.Kernodle and is on chronic MTX. He Is currently off MTX reports joint pains are controlled, not quite symptomatic.   Patient denies any persistent bone pain or any pain. He continue to feel lack of energy, persistent, not improved with resting or taking naps. He also has lost 25 pounds in the past year which was unintentional.   # Rheumatoid arthritis and currently is off MTX.  # It is ambiguous whether he has truly symptomatic multiple myeloma or a smoldering myeloma, given that anemia can be secondary to iron deficiency and CKD.  The hypermetabolic 2.1 cm hypermetabolic soft tissue lesion in the left heel, can reflect a Plasmacytoma,which should have been biopsied. However patient initially denies being on any blood thinner, later he was found to be on Plavix with pre biopsy questionnaire screening, and biopsy was held. Patient has to contact cardiologist to hold plavix for seven days before procedure. He feels frustrated about multiple workup and meanwhile he becomes more fatigues, with worsening of kidney function. Patient was reluctant about proceeding additional invasive procedures and wants to start treatment. Decision was made to start active MM treatment.   # [facility] bone marrow [facility]. He is not considered to  good candidate for transplant. Patient cardiologist Dr. KaChancy Milroyad started patient on Lasix 20 mg daily. [facility] as patient was having dizziness and borderline blood pressure during his clinic visit there.  10/02/2018 He had follow-up left foot/ankle x-ray as a follow-up of his left heel lesion which was initially presented on PET scan and not detectable on follow-up PET scan.  Images were reviewed by me.  No  suspicious acute or subacute osseous abnormality  Current Treatment:  S/p RVD x 4 cycles tolerates well except cytopenia.  Revlimid 10 mg PO once per day on days 1 to 14 (dose reduced due to GFR) Revlimid renal dosing of 9m,  Velcade 1.3 mg/m2 IV once per day on days 1,  8, 15 Dexamethasone 20 mg PO once per day on days 1, 8, 15 given patient's diabetes Given patient's multiple comorbidity and the side effects of cytopenia during his RVD treatment and the uncertainty of this is really a site of plasmacytoma or not, I recommend not to continue him on maintenance Revlimid.   # repeat small bowel capsule study on 02/16/2019 Which showed multiple AVMs seen in the mid jejunum proximal to the site of prior tattoo. # [facility]2019 double-balloon enteroscopy with findings of 5 AVMs in the jejunum and 2 AVMs in the duodenum and AVMs were ablated with APC.  Tattoo was seen in the area. If further bleeding, recommend to consider treatment with octreotide. Patient takes Protonix daily.    INTERVAL HISTORY Patient presents for follow-up of management of multiple myeloma/smoldering multiple myeloma and iron deficiency anemia.Patient is not currently on any maintenance treatments. Patient is on Eliquis 2.5 mg twice daily for atrial fibrillation.  Denies hematochezia, hematuria, hematemesis, epistaxis, black tarry stool or easy bruising.  He has received multiple IV venofer treatments.  His fatigue has improved.   Review of Systems  Constitutional: Negative for appetite change, chills, fatigue, fever and unexpected weight change.  HENT:   Negative for hearing loss and voice change.   Eyes: Negative for eye problems and icterus.  Respiratory: Negative for chest tightness, cough and shortness of breath.   Cardiovascular: Negative for chest pain and leg swelling.  Gastrointestinal: Negative for abdominal distention, abdominal pain and diarrhea.  Endocrine:  Negative for hot flashes.  Genitourinary: Negative for difficulty urinating, dysuria and frequency.   Musculoskeletal: Negative for arthralgias.  Skin: Negative for itching and rash.  Neurological: Negative for light-headedness and numbness.  Hematological: Negative for adenopathy. Does not bruise/bleed easily.  Psychiatric/Behavioral: Negative for confusion.     MEDICAL HISTORY:  Past Medical History:  Diagnosis Date  . Anxiety   . Arthritis    rheumatoid  . Atrial fibrillation (HCC)    hx of  . Cancer ([facility]   . Chronic kidney disease   . Colon polyp 07-07-15   TUBULAR ADENOMA WITH AT LEAST HIGH-GRADE / Dr RRayann Heman . Diabetes mellitus without complication ([facility]   . Dysrhythmia    bradycardia....Marland KitchenMarland Kitchen to 1 heart block  . GERD (gastroesophageal reflux disease)   . Heart murmur    patient unaware of history of murmur  . Hypercholesterolemia   . Hypertension   . Presence of permanent cardiac pacemaker   . Prostate cancer ([facility] 01/01/13, 01/30/14   Gleason 3+4=7, volume 46.6 cc  . Rheumatoid arthritis ([facility]   . S/P radiation therapy  04/03/2014 through 06/04/2014                                                      Prostate 7800 cGy in 40 sessions                          . Sleep  apnea    uses cpap but it is not in the hospital with him    SURGICAL HISTORY: Past Surgical History:  Procedure Laterality Date  . ABDOMINAL AORTIC ANEURYSM REPAIR  11/2013  . COLON SURGERY  March 2017   Right hemicolectomy for tubulovillous adenoma with high-grade dysplasia.  . COLONOSCOPY WITH PROPOFOL N/A 07/07/2015   Procedure: COLONOSCOPY WITH PROPOFOL;  Surgeon: Josefine Class, MD;  Location[facility] ENDOSCOPY;  Service: Endoscopy;  Laterality: N/A;  . COLONOSCOPY WITH PROPOFOL N/A 10/28/2016   Procedure: COLONOSCOPY WITH PROPOFOL;  Surgeon: [name], MD;  Location[facility] ENDOSCOPY;  Service: Endoscopy;  Laterality: N/A;  . ESOPHAGOGASTRODUODENOSCOPY (EGD) WITH PROPOFOL N/A 10/28/2016   Procedure:  ESOPHAGOGASTRODUODENOSCOPY (EGD) WITH PROPOFOL;  Surgeon: [name], MD;  Location: [facility] ENDOSCOPY;  Service: Endoscopy;  Laterality: N/A;  . GIVENS CAPSULE STUDY N/A 07/25/2017   Procedure: GIVENS CAPSULE STUDY;  Surgeon: [name], MD;  Location: [facility] ENDOSCOPY;  Service: Gastroenterology;  Laterality: N/A;  . GIVENS CAPSULE STUDY N/A 01/29/2019   Procedure: GIVENS CAPSULE STUDY;  Surgeon: [name], MD;  Location: [facility] ENDOSCOPY;  Service: Gastroenterology;  Laterality: N/A;  . LAPAROSCOPIC RIGHT COLECTOMY Right 08/08/2015   Procedure: LAPAROSCOPIC RIGHT COLECTOMY;  Surgeon: Robert Bellow, MD;  Location: [facility] ORS;  Service: General;  Laterality: Right;  . NASAL SINUS SURGERY    . PACEMAKER INSERTION Left 08/26/2016   Procedure: INSERTION PACEMAKER;  Surgeon: Isaias Cowman, MD;  Location: [facility] ORS;  Service: Cardiovascular;  Laterality: Left;  . PROSTATE BIOPSY  01/01/13, 01/30/14   Gleason 3+3=6, vol 46.6 cc  . TONSILLECTOMY    . uvula surgery     for sleep apnea    SOCIAL HISTORY: Social History   Socioeconomic History  . Marital status: Divorced    Spouse name: Not on file  . Number of children: Not on file  . Years of education: Not on file  . Highest education level: Not on file  Occupational History  . Not on file  Tobacco Use  . Smoking status: Current Every Day Smoker    Packs/day: 1.00    Years: 60.00    Pack years: 60.00    Types: Cigarettes  . Smokeless tobacco: Never Used  . Tobacco comment: DOWN TO 1/2 PPD  Substance and Sexual Activity  . Alcohol use: Yes    Alcohol/week: 1.0 standard drinks    Types: 1 Cans of beer per week    Comment: occasionally  . Drug use: No  . Sexual activity: Not Currently  Other Topics Concern  . Not on file  Social History Narrative  . Not on file   Social Determinants of Health   Financial Resource Strain:   . Difficulty of Paying Living Expenses:   Food Insecurity:   . Worried About Charity fundraiser in the  Last Year:   . Arboriculturist in the Last Year:   Transportation Needs:   . Film/video editor (Medical):   Marland Kitchen Lack of Transportation (Non-Medical):   Physical Activity:   . Days of Exercise per Week:   . Minutes of Exercise per Session:   Stress:   . Feeling of Stress :   Social Connections:   . Frequency of Communication with Friends and Family:   . Frequency of Social Gatherings with Friends and Family:   . Attends Religious Services:   . Active Member of Clubs or Organizations:   . Attends Archivist Meetings:   Marland Kitchen Marital Status:  Intimate Partner Violence:   . Fear of Current or Ex-Partner:   . Emotionally Abused:   Marland Kitchen Physically Abused:   . Sexually Abused:     FAMILY HISTORY: Family History  Problem Relation Age of Onset  . Heart attack Mother   . Cirrhosis Father   . Pancreatic cancer Brother   . Diabetes Brother   . Diabetes Daughter        medication induced for cancer treatments  . Cancer Daughter        breast, brain    ALLERGIES:  has No Known Allergies.  MEDICATIONS:  Current Outpatient Medications  Medication Sig Dispense Refill  . acetaminophen (TYLENOL) 325 MG tablet Take 325 mg by mouth every 6 (six) hours as needed.    Marland Kitchen albuterol (PROVENTIL HFA;VENTOLIN HFA) 108 (90 Base) MCG/ACT inhaler     . ALPRAZolam (XANAX) 0.25 MG tablet Take by mouth 2 (two) times daily as needed.     Marland Kitchen apixaban (ELIQUIS) 2.5 MG TABS tablet Take by mouth.    Marland Kitchen atorvastatin (LIPITOR) 80 MG tablet Take by mouth.    . Butalbital-APAP-Caffeine (FIORICET) 50-300-40 MG CAPS Take 1 capsule every four to six hours as needed    . Cholecalciferol 1.25 MG (50000 UT) capsule Take by mouth.    . colchicine 0.6 MG tablet Take by mouth.    . ezetimibe (ZETIA) 10 MG tablet Take 1 tablet by mouth 1 day or 1 dose.    . fexofenadine (ALLEGRA) 180 MG tablet Take by mouth.    . fluticasone (FLONASE) 50 MCG/ACT nasal spray Place 2 sprays into both nostrils daily as needed for  rhinitis.     . Fluticasone Furoate-Vilanterol (BREO ELLIPTA) 100-25 MCG/INH AEPB Inhale 1 puff into the lungs daily.     . folic acid (FOLVITE) 1 MG tablet Take by mouth.    . gabapentin (NEURONTIN) 400 MG capsule Take by mouth.    Marland Kitchen glucose blood (ONETOUCH ULTRA) test strip     . isosorbide mononitrate (IMDUR) 30 MG 24 hr tablet Take 30 mg by mouth daily.    Marland Kitchen JARDIANCE 25 MG TABS tablet     . metoprolol succinate (TOPROL-XL) 25 MG 24 hr tablet Take 25 mg by mouth daily.    . pantoprazole (PROTONIX) 40 MG tablet Take 1 tablet (40 mg total) by mouth 2 (two) times daily. 60 tablet 3  . tamsulosin (FLOMAX) 0.4 MG CAPS capsule     . traMADol-acetaminophen (ULTRACET) 37.5-325 MG tablet     . amLODipine-benazepril (LOTREL) 5-20 MG capsule Take 1 capsule by mouth daily.    . benazepril (LOTENSIN) 20 MG tablet Take 20 mg by mouth daily.    . benzonatate (TESSALON) 100 MG capsule Take 100 mg by mouth 3 (three) times daily.    . clopidogrel (PLAVIX) 75 MG tablet Take by mouth.    . furosemide (LASIX) 20 MG tablet Take 20 mg by mouth daily.    . hydroxychloroquine (PLAQUENIL) 200 MG tablet Take by mouth.    . loperamide (IMODIUM) 2 MG capsule Take 1 capsule (2 mg total) by mouth See admin instructions. With onset of diarrhea, take 53m followed by 224mevery 2 hours resolved. Maximum: 16 mg/day (Patient not taking: Reported on 09/19/2019) 120 capsule 0  . metFORMIN (GLUCOPHAGE) 500 MG tablet Take 500 mg by mouth daily with breakfast.     . ondansetron (ZOFRAN) 8 MG tablet Take 1 tablet (8 mg total) 2 (two) times daily as needed by mouth (Nausea  or vomiting). (Patient not taking: Reported on 09/19/2019) 30 tablet 1  . pantoprazole (PROTONIX) 20 MG tablet Take 20 mg by mouth daily.    Marland Kitchen tiZANidine (ZANAFLEX) 2 MG tablet Take 2 mg by mouth 3 (three) times daily.    Marland Kitchen triamcinolone cream (KENALOG) 0.1 % Apply 1 application topically 2 (two) times daily.    Marland Kitchen UNABLE TO FIND SMARTSIG:1 Capsule(s) By Mouth Once a  Week     No current facility-administered medications for this visit.      Marland Kitchen  PHYSICAL EXAMINATION: ECOG PERFORMANCE STATUS: 1 - Symptomatic but completely ambulatory Vitals:   09/19/19 1306  BP: 102/67  Pulse: 73  Resp: 18  Temp: (!) 97.1 F (36.2 C)   Filed Weights   09/19/19 1306  Weight: 189 lb 3.2 oz (85.8 kg)   Physical Exam  Constitutional: He is oriented to person, place, and time and well-developed, well-nourished, and in no distress. No distress.  HENT:  Head: Normocephalic and atraumatic.  Nose: Nose normal.  Mouth/Throat: Oropharynx is clear and moist. No oropharyngeal exudate.  Eyes: Pupils are equal, round, and reactive to light. EOM are normal. Right eye exhibits no discharge. Left eye exhibits no discharge. No scleral icterus.  Neck: No JVD present.  Cardiovascular: Normal rate, regular rhythm and normal heart sounds. Exam reveals no friction rub.  No murmur heard. Pulmonary/Chest: Effort normal and breath sounds normal. No respiratory distress. He has no wheezes. He has no rales. He exhibits no tenderness.  Abdominal: Soft. Bowel sounds are normal. He exhibits no distension and no mass. There is no abdominal tenderness.  Musculoskeletal:        General: No tenderness or edema. Normal range of motion.     Cervical back: Normal range of motion and neck supple.  Lymphadenopathy:    He has no cervical adenopathy.  Neurological: He is alert and oriented to person, place, and time. No cranial nerve deficit. He exhibits normal muscle tone. Coordination normal.  Skin: Skin is warm and dry. He is not diaphoretic. No erythema.  Hypopigmentation of skin  Psychiatric: Affect normal.    LABORATORY DATA:  I have reviewed the data as listed Lab Results  Component Value Date   WBC 8.2 09/17/2019   HGB 11.6 (L) 09/17/2019   HCT 38.5 (L) 09/17/2019   MCV 78.9 (L) 09/17/2019   PLT 250 09/17/2019   Recent Labs    09/22/18 1108 09/22/18 1108 09/29/18 1133  12/18/18 1344 09/17/19 1057  NA 139   < > 135 138 138  K 4.3   < > 4.2 4.3 4.6  CL 109   < > 107 111 108  CO2 24   < > '22 23 24  ' GLUCOSE 100*   < > 90 95 93  BUN 17   < > '18 22 21  ' CREATININE 1.53*   < > 1.24 1.49* 1.51*  CALCIUM 8.8*   < > 8.9 8.7* 8.6*  GFRNONAA 44*   < > 56* 45* 44*  GFRAA 50*   < > >60 52* 51*  PROT 8.6*  --   --  8.1 7.5  ALBUMIN 3.7  --   --  3.2* 3.4*  AST 15  --   --  14* 15  ALT 12  --   --  14 14  ALKPHOS 67  --   --  58 55  BILITOT 0.4  --   --  0.4 0.5   < > = values in this interval  not displayed.    SPEP: no M spike Serum free light chain Kappa/Lamda ratio 13.04 UPEP  Free light chain Kappa/lamda ratio 162.43, M spike 173m/24 hour   Bone marrow biopsy 03/21/2017  Bone Marrow, Aspirate,Biopsy, and Clot, left iliac - HYPERCELLULAR BONE MARROW FOR AGE WITH TRILINEAGE HEMATOPOIESIS. - PLASMACYTOSIS (PLASMA CELLS 8%)  Karyotype: loss of Y chromosome Cytogenetic MDS FISH Panel negative.   Bone marrow 04/06/2017  Bone Marrow, Aspirate,Biopsy, and Clot, right iliac and core - HYPERCELLULAR BONE MARROW FOR AGE WITH PLASMA CELL NEOPLASM - TRILINEAGE HEMATOPOIESIS. - SEE COMMENT. PERIPHERAL BLOOD: - MICROCYTIC-HYPOCHROMIC ANEMIA. Diagnosis Note The bone marrow is hypercellular with trilineage hematopoiesis but with relative abundance of erythroid precursors and increased number of megakaryocytes with nonspecific changes. Significant dyspoiesis is not seen. Iron stores are present with no ring sideroblasts. The plasma cells are increased in number representing 8% of all cells in the aspirate although focal areas in the core biopsy show 10 to 20% as primarily seen by CD138 stain. In situ hybridization for kappa and lambda light chains show kappa light chain restriction consistent with plasma cell neoplasm. Correlation with cytogenetic and FISH studies is recommended. (BNS:ecj/gt 04/07/2017)  IMAGE STUDIES I have personally reviewed below image  results.  03/15/2017 DG bone survey Met: Nonspecific lucent lesion in the distal right ulna measuring 15-16 mm, but otherwise normal bone mineralization for age throughout the visible skeleton. A solitary lytic lesion in a distal extremity would be an unusual presentation of multiple myeloma, and I favor the distal right ulna lesion is benign. Recommend correlation with serum and urine protein electrophoresis.  04/14/2017 PET scan: 1. 2.1 cm hypermetabolic soft tissue lesion in the left heel, adjacent to the calcaneal tuberosity. 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 2. Mottled FDG accumulation diffusely in the marrow space without other frankly overt hypermetabolic bony lesion. 3. Coronary artery and thoracoabdominal aortic atherosclerosis with abdominal aortic stent graft visualized in situ. 4. Bilateral renal cysts and bilateral nonobstructing renal stones.  08/24/2017 PET scan 1. No hypermetabolic osseous lesions. No definite signs of multiple myeloma on CT imaging. 2. Interval improvement in soft tissue activity along the medial aspect of the left calcaneal tuberosity, likely plantar fasciitis. 3. New small left pleural effusion. 4. Bilateral inguinal hernias containing small bowel on the right and sigmoid colon on the left. No evidence of incarceration or obstruction. 5. Otherwise stable incidental findings including diffuse atherosclerosis, bilateral renal cysts, emphysema and sigmoid diverticulosis.  ASSESSMENT & PLAN:  1. Iron deficiency anemia due to chronic blood loss    #Iron deficiency anemia secondary to chronic blood loss due to multiple AVM in small bowels. [facility] Labs are reviewed and discussed with patient. Hemoglobin 11/6, ferritin 39, iron saturation 9.  I recommend patient to continue IV venofer 2048mweekly x 2.  Then he will have cbc checked Q2 weeks for close monitoring of his count.  If anemia/iron deficiency worsen, then  he may have recurrent GI bleeding.  Octreotide may be considered. Will need to further discuss with GI.   # Chronic anticoagulation with Eliquis 2.47m43mid for A Fib. Called De.Khan's office to discuss anticoagulation.  Awaiting call back.   #Smoldering multiple myeloma/active myeloma if previous hypermetabolic lesion on PET scan was related to myeloma. Patient was treated with 4 cycles of RVD.  He is not currently on any maintenance therapy due to multiple comorbidities, borderline smoldering multiple myeloma status. Multiple myeloma panel results were reviewed and discussed  with patient. Stable disease. Marland Kitchen #History of prostate cancer, patient reports that he is not currently following up with anyone.  PSA is stable.  Follow-up in 8 weeks with reevaluation of blood work and MD reassessment.  Orders Placed This Encounter  Procedures  . CBC with Differential/Platelet    Standing Status:   Future    Standing Expiration Date:   09/18/2020  . Iron and TIBC    Standing Status:   Future    Standing Expiration Date:   09/18/2020  . Ferritin    Standing Status:   Future    Standing Expiration Date:   09/18/2020    Earlie Server, MD, PhD Hematology Oncology [facility] Pager- [phone] 09/19/2019

## 2019-09-19 NOTE — Progress Notes (Signed)
Patient newly diagnosed with kidney stone and is being referred to urologist.

## 2019-09-19 NOTE — Telephone Encounter (Addendum)
Contacted Dr. Marella Bile office and requested for call return to Dr. Tasia Catchings regarding patient's anticoagulation medication.

## 2019-09-20 ENCOUNTER — Telehealth: Payer: Self-pay

## 2019-09-20 NOTE — Telephone Encounter (Signed)
-----   Message from Earlie Server, MD sent at 09/19/2019 10:08 PM EDT ----- Please also arrange him to do CBC in 3 weeks, 5 weeks.  And also call Dr.Khan's office asking him to call me regarding his anticoagulation

## 2019-09-20 NOTE — Telephone Encounter (Signed)
Done... Pt has been scheduled for additional lab to be drawn in 3 weeks and 5 weeks. Pt is aware of his scheduled lab appts.Marland Kitchen

## 2019-09-20 NOTE — Telephone Encounter (Signed)
I have contacted Dr. Marella Bile office and asked for him to return call to Dr. Tasia Catchings.

## 2019-09-27 ENCOUNTER — Inpatient Hospital Stay: Payer: Medicare PPO

## 2019-09-27 ENCOUNTER — Other Ambulatory Visit: Payer: Self-pay

## 2019-09-27 VITALS — BP 97/61 | HR 69 | Temp 96.1°F | Resp 20

## 2019-09-27 DIAGNOSIS — D500 Iron deficiency anemia secondary to blood loss (chronic): Secondary | ICD-10-CM | POA: Diagnosis not present

## 2019-09-27 DIAGNOSIS — D509 Iron deficiency anemia, unspecified: Secondary | ICD-10-CM

## 2019-09-27 MED ORDER — SODIUM CHLORIDE 0.9 % IV SOLN
INTRAVENOUS | Status: DC
  Filled 2019-09-27: qty 250

## 2019-09-27 MED ORDER — IRON SUCROSE 20 MG/ML IV SOLN
200.0000 mg | Freq: Once | INTRAVENOUS | Status: AC
  Administered 2019-09-27: 200 mg via INTRAVENOUS
  Filled 2019-09-27: qty 10

## 2019-10-02 NOTE — Progress Notes (Signed)
10/03/19 12:59 PM   Omar [name] [birth year] [id number]  Referring provider: Jodi Marble, MD [place],  [place] [address] Chief Complaint  Patient presents with  . Nephrolithiasis    HPI: Omar [name] is a 79 y.o. M w/ hx of prostate cancer s/p IMRT 2015 presents today for the evaluation and management of nephrolithiasis.   Visited PCP on 08/27/19 c/o low back pain which worsens w/ standing and relieved with sitting upright but exacerbated by slouching. F/u CT A/P on 08/29/19 revealed multiple nonobstructing bilateral renal calculi and multiple bilateral renal cysts. No obstructive uropathy noted. Report also indicates thickening of bladder wall and metallic markers in his prostate. His CT scan done at [facility] and we only have report available. Don't have size or complexity of stones.   Imaging dating back to 2018 was personally reviewed and at that time he had punctate stones 2-3 mm bilaterally as well as bilateral renal cysts which is simple in nature measuring up to 9.2 cm on the right in largest diameter.   Most recent PSA 0.07 as of 09/17/19.    He reports of pain across entire lower back which started years ago worsening upon physical exacerbation. He reports of improvement in his urinary symptoms w/ Flomax which were started for ? Stones recently by PCP.    PMH: Past Medical History:  Diagnosis Date  . Anxiety   . Arthritis    rheumatoid  . Atrial fibrillation (HCC)    hx of  . Cancer ([place])   . Chronic kidney disease   . Colon polyp 07-07-15   TUBULAR ADENOMA WITH AT LEAST HIGH-GRADE / Dr Rayann Heman  . Diabetes mellitus without complication ([place])   . Dysrhythmia    bradycardia...Marland KitchenMarland Kitchen2 to 1 heart block  . GERD (gastroesophageal reflux disease)   . Heart murmur    patient unaware of history of murmur  . Hypercholesterolemia   . Hypertension   . Presence of permanent cardiac pacemaker   . Prostate cancer ([place]) 01/01/13, 01/30/14   Gleason  3+4=7, volume 46.6 cc  . Rheumatoid arthritis ([place])   . S/P radiation therapy  04/03/2014 through 06/04/2014                                                      Prostate 7800 cGy in 40 sessions                          . Sleep apnea    uses cpap but it is not in the hospital with him    Surgical History: Past Surgical History:  Procedure Laterality Date  . ABDOMINAL AORTIC ANEURYSM REPAIR  11/2013  . COLON SURGERY  March 2017   Right hemicolectomy for tubulovillous adenoma with high-grade dysplasia.  . COLONOSCOPY WITH PROPOFOL N/A 07/07/2015   Procedure: COLONOSCOPY WITH PROPOFOL;  Surgeon: Josefine Class, MD;  Location: [facility] ENDOSCOPY;  Service: Endoscopy;  Laterality: N/A;  . COLONOSCOPY WITH PROPOFOL N/A 10/28/2016   Procedure: COLONOSCOPY WITH PROPOFOL;  Surgeon: [name], MD;  Location: [facility] ENDOSCOPY;  Service: Endoscopy;  Laterality: N/A;  . ESOPHAGOGASTRODUODENOSCOPY (EGD) WITH PROPOFOL N/A 10/28/2016   Procedure: ESOPHAGOGASTRODUODENOSCOPY (EGD) WITH PROPOFOL;  Surgeon: [name], MD;  Location: [facility] ENDOSCOPY;  Service: Endoscopy;  Laterality: N/A;  . GIVENS  CAPSULE STUDY N/A 07/25/2017   Procedure: GIVENS CAPSULE STUDY;  Surgeon: [name], MD;  Location: [facility] ENDOSCOPY;  Service: Gastroenterology;  Laterality: N/A;  . GIVENS CAPSULE STUDY N/A 01/29/2019   Procedure: GIVENS CAPSULE STUDY;  Surgeon: [name], MD;  Location: [facility] ENDOSCOPY;  Service: Gastroenterology;  Laterality: N/A;  . LAPAROSCOPIC RIGHT COLECTOMY Right 08/08/2015   Procedure: LAPAROSCOPIC RIGHT COLECTOMY;  Surgeon: Robert Bellow, MD;  Location: [facility] ORS;  Service: General;  Laterality: Right;  . NASAL SINUS SURGERY    . PACEMAKER INSERTION Left 08/26/2016   Procedure: INSERTION PACEMAKER;  Surgeon: Isaias Cowman, MD;  Location: [facility] ORS;  Service: Cardiovascular;  Laterality: Left;  . PROSTATE BIOPSY  01/01/13, 01/30/14   Gleason 3+3=6, vol 46.6 cc  . TONSILLECTOMY    . uvula surgery      for sleep apnea    Home Medications:  Allergies as of 10/03/2019   No Known Allergies     Medication List       Accurate as of October 03, 2019 11:59 PM. If you have any questions, ask your nurse or doctor.        STOP taking these medications   benzonatate 100 MG capsule Commonly known as: TESSALON Stopped by: Hollice Espy, MD     TAKE these medications   acetaminophen 325 MG tablet Commonly known as: TYLENOL Take 325 mg by mouth every 6 (six) hours as needed.   albuterol 108 (90 Base) MCG/ACT inhaler Commonly known as: VENTOLIN HFA   ALPRAZolam 0.25 MG tablet Commonly known as: XANAX Take by mouth 2 (two) times daily as needed.   amLODipine-benazepril 5-20 MG capsule Commonly known as: LOTREL Take 1 capsule by mouth daily.   apixaban 2.5 MG Tabs tablet Commonly known as: ELIQUIS Take by mouth.   atorvastatin 80 MG tablet Commonly known as: LIPITOR Take by mouth.   benazepril 20 MG tablet Commonly known as: LOTENSIN Take 20 mg by mouth daily.   Breo Ellipta 100-25 MCG/INH Aepb Generic drug: fluticasone furoate-vilanterol Inhale 1 puff into the lungs daily.   Cholecalciferol 1.25 MG (50000 UT) capsule Take by mouth.   clopidogrel 75 MG tablet Commonly known as: PLAVIX Take by mouth.   colchicine 0.6 MG tablet Take by mouth.   ezetimibe 10 MG tablet Commonly known as: ZETIA Take 1 tablet by mouth 1 day or 1 dose.   fexofenadine 180 MG tablet Commonly known as: ALLEGRA Take by mouth.   Fioricet 50-300-40 MG Caps Generic drug: Butalbital-APAP-Caffeine Take 1 capsule every four to six hours as needed   fluticasone 50 MCG/ACT nasal spray Commonly known as: FLONASE Place 2 sprays into both nostrils daily as needed for rhinitis.   folic acid 1 MG tablet Commonly known as: FOLVITE Take by mouth.   furosemide 20 MG tablet Commonly known as: LASIX Take 20 mg by mouth daily.   gabapentin 400 MG capsule Commonly known as: NEURONTIN Take by  mouth.   hydroxychloroquine 200 MG tablet Commonly known as: PLAQUENIL Take by mouth.   isosorbide mononitrate 30 MG 24 hr tablet Commonly known as: IMDUR Take 30 mg by mouth daily.   Jardiance 25 MG Tabs tablet Generic drug: empagliflozin   loperamide 2 MG capsule Commonly known as: IMODIUM Take 1 capsule (2 mg total) by mouth See admin instructions. With onset of diarrhea, take 4mg  followed by 2mg  every 2 hours resolved. Maximum: 16 mg/day   metFORMIN 500 MG tablet Commonly known as: GLUCOPHAGE Take 500 mg by mouth daily  with breakfast.   metoprolol succinate 25 MG 24 hr tablet Commonly known as: TOPROL-XL Take 25 mg by mouth daily.   ondansetron 8 MG tablet Commonly known as: Zofran Take 1 tablet (8 mg total) 2 (two) times daily as needed by mouth (Nausea or vomiting).   OneTouch Ultra test strip Generic drug: glucose blood   pantoprazole 40 MG tablet Commonly known as: PROTONIX Take 1 tablet (40 mg total) by mouth 2 (two) times daily.   pantoprazole 20 MG tablet Commonly known as: PROTONIX Take 20 mg by mouth daily.   tamsulosin 0.4 MG Caps capsule Commonly known as: FLOMAX Take 1 capsule (0.4 mg total) by mouth daily after supper. What changed:   how much to take  how to take this  when to take this Changed by: Hollice Espy, MD   tiZANidine 2 MG tablet Commonly known as: ZANAFLEX Take 2 mg by mouth 3 (three) times daily.   traMADol-acetaminophen 37.5-325 MG tablet Commonly known as: ULTRACET   triamcinolone cream 0.1 % Commonly known as: KENALOG Apply 1 application topically 2 (two) times daily.   UNABLE TO FIND SMARTSIG:1 Capsule(s) By Mouth Once a Week       Allergies: No Known Allergies  Family History: Family History  Problem Relation Age of Onset  . Heart attack Mother   . Cirrhosis Father   . Pancreatic cancer Brother   . Diabetes Brother   . Diabetes Daughter        medication induced for cancer treatments  . Cancer  Daughter        breast, brain    Social History:  reports that he has been smoking cigarettes. He has a 60.00 pack-year smoking history. He has never used smokeless tobacco. He reports current alcohol use of about 1.0 standard drinks of alcohol per week. He reports that he does not use drugs.   Physical Exam: BP 104/69   Pulse 85   Ht 5\' 11"  (1.803 m)   Wt 180 lb (81.6 kg)   BMI 25.10 kg/m   Constitutional:  Alert and oriented, No acute distress. HEENT: [place] AT, moist mucus membranes.  Trachea midline, no masses. Cardiovascular: No clubbing, cyanosis, or edema. Respiratory: Normal respiratory effort, no increased work of breathing. Skin: No rashes, bruises or suspicious lesions. Neurologic: Grossly intact, no focal deficits, moving all 4 extremities. Psychiatric: Normal mood and affect.  Laboratory Data:  Lab Results  Component Value Date   CREATININE 1.51 (H) 09/17/2019   UA 3+ glucose otherwise unremarkable.   Assessment & Plan:    1. Bilateral Kidney stones  Unclear if stones progressed - unable to review images and no sizes KUB today for anatomic evaluation and inability to review CT scan Suspect these likely stable given non obstructing nature  Not recommend any intervention   2. Renal cyst  Previously dating back to 2018  Suspect also likely unchanged, again unable to view images and not sizes or report Ideally patient can bring [place] disk for view  3. Hx of Prostate Cancer PSA remains low  s/p iMRT 2015   4. Back pain  Discussed referral to PT if desired, declined Likely [facility]  5. BPH w/ obstruction  Improved urinary symptoms w/ Flomax  Continue Flomax; refilled for 1 year Return in 1 year for IPSS/PVR/PSA   Return in about 1 year (around 10/02/2020) for IPSS/ PVR.  [facility] [address] [place], [place] [address] [phone]  I, Lucas Mallow, am acting as  a scribe for Dr. Hollice Espy,  I have  reviewed the above documentation for accuracy and completeness, and I agree with the above.   Hollice Espy, MD

## 2019-10-03 ENCOUNTER — Other Ambulatory Visit: Payer: Self-pay

## 2019-10-03 ENCOUNTER — Ambulatory Visit
Admission: RE | Admit: 2019-10-03 | Discharge: 2019-10-03 | Disposition: A | Discharge status: Home / Self Care | Payer: Medicare PPO | Source: Ambulatory Visit | Attending: Urology | Admitting: Urology

## 2019-10-03 ENCOUNTER — Ambulatory Visit: Payer: Medicare PPO | Admitting: Urology

## 2019-10-03 VITALS — BP 104/69 | HR 85 | Ht 71.0 in | Wt 180.0 lb

## 2019-10-03 DIAGNOSIS — N200 Calculus of kidney: Secondary | ICD-10-CM | POA: Insufficient documentation

## 2019-10-03 DIAGNOSIS — G8929 Other chronic pain: Secondary | ICD-10-CM

## 2019-10-03 DIAGNOSIS — M545 Low back pain, unspecified: Secondary | ICD-10-CM

## 2019-10-03 DIAGNOSIS — N401 Enlarged prostate with lower urinary tract symptoms: Secondary | ICD-10-CM | POA: Diagnosis not present

## 2019-10-03 DIAGNOSIS — N281 Cyst of kidney, acquired: Secondary | ICD-10-CM

## 2019-10-03 DIAGNOSIS — N138 Other obstructive and reflux uropathy: Secondary | ICD-10-CM

## 2019-10-03 MED ORDER — TAMSULOSIN HCL 0.4 MG PO CAPS: 0.4000 mg | ORAL_CAPSULE | Freq: Every day | ORAL | 3 refills | Status: DC

## 2019-10-04 ENCOUNTER — Telehealth: Payer: Self-pay | Admitting: *Deleted

## 2019-10-04 LAB — MICROSCOPIC EXAMINATION
Bacteria, UA: NONE SEEN
Epithelial Cells (non renal): NONE SEEN /hpf (ref 0–10)
RBC, Urine: NONE SEEN /hpf (ref 0–2)

## 2019-10-04 LAB — URINALYSIS, COMPLETE
Bilirubin, UA: NEGATIVE
Ketones, UA: NEGATIVE
Leukocytes,UA: NEGATIVE
Nitrite, UA: NEGATIVE
Protein,UA: NEGATIVE
RBC, UA: NEGATIVE
Specific Gravity, UA: 1.02 (ref 1.005–1.030)
Urobilinogen, Ur: 0.2 mg/dL (ref 0.2–1.0)
pH, UA: 5.5 (ref 5.0–7.5)

## 2019-10-04 NOTE — Telephone Encounter (Addendum)
Patient informed, voiced understanding.   ----- Message from Hollice Espy, MD sent at 10/04/2019  8:29 AM EDT ----- This x-ray does not show your stones.  This means that there likely very small as I suspected from previous imaging.  Again, I do not feel that this is the source of your pain.  I would recommend observation for the stones as discussed.  See you next year.  Hollice Espy, MD

## 2019-10-11 ENCOUNTER — Other Ambulatory Visit: Payer: Self-pay

## 2019-10-11 ENCOUNTER — Inpatient Hospital Stay: Payer: Medicare PPO | Attending: Oncology

## 2019-10-11 DIAGNOSIS — D500 Iron deficiency anemia secondary to blood loss (chronic): Secondary | ICD-10-CM | POA: Diagnosis present

## 2019-10-11 DIAGNOSIS — K5520 Angiodysplasia of colon without hemorrhage: Secondary | ICD-10-CM | POA: Diagnosis not present

## 2019-10-11 LAB — CBC WITH DIFFERENTIAL/PLATELET
Abs Immature Granulocytes: 0.04 10*3/uL (ref 0.00–0.07)
Basophils Absolute: 0.1 10*3/uL (ref 0.0–0.1)
Basophils Relative: 1 %
Eosinophils Absolute: 0.4 10*3/uL (ref 0.0–0.5)
Eosinophils Relative: 4 %
HCT: 44.9 % (ref 39.0–52.0)
Hemoglobin: 13.7 g/dL (ref 13.0–17.0)
Immature Granulocytes: 0 %
Lymphocytes Relative: 12 %
Lymphs Abs: 1.2 10*3/uL (ref 0.7–4.0)
MCH: 24.4 pg — ABNORMAL LOW (ref 26.0–34.0)
MCHC: 30.5 g/dL (ref 30.0–36.0)
MCV: 80 fL (ref 80.0–100.0)
Monocytes Absolute: 0.9 10*3/uL (ref 0.1–1.0)
Monocytes Relative: 9 %
Neutro Abs: 7.2 10*3/uL (ref 1.7–7.7)
Neutrophils Relative %: 74 %
Platelets: 215 10*3/uL (ref 150–400)
RBC: 5.61 MIL/uL (ref 4.22–5.81)
RDW: 25.6 % — ABNORMAL HIGH (ref 11.5–15.5)
WBC: 9.7 10*3/uL (ref 4.0–10.5)
nRBC: 0 % (ref 0.0–0.2)

## 2019-10-15 DIAGNOSIS — N200 Calculus of kidney: Secondary | ICD-10-CM | POA: Insufficient documentation

## 2019-10-25 ENCOUNTER — Other Ambulatory Visit: Payer: Self-pay

## 2019-10-25 ENCOUNTER — Inpatient Hospital Stay: Payer: Medicare PPO

## 2019-10-25 DIAGNOSIS — D500 Iron deficiency anemia secondary to blood loss (chronic): Secondary | ICD-10-CM | POA: Diagnosis not present

## 2019-10-25 LAB — CBC WITH DIFFERENTIAL/PLATELET
Abs Immature Granulocytes: 0.06 10*3/uL (ref 0.00–0.07)
Basophils Absolute: 0 10*3/uL (ref 0.0–0.1)
Basophils Relative: 0 %
Eosinophils Absolute: 0.3 10*3/uL (ref 0.0–0.5)
Eosinophils Relative: 2 %
HCT: 44.5 % (ref 39.0–52.0)
Hemoglobin: 13.7 g/dL (ref 13.0–17.0)
Immature Granulocytes: 1 %
Lymphocytes Relative: 11 %
Lymphs Abs: 1.1 10*3/uL (ref 0.7–4.0)
MCH: 24.8 pg — ABNORMAL LOW (ref 26.0–34.0)
MCHC: 30.8 g/dL (ref 30.0–36.0)
MCV: 80.5 fL (ref 80.0–100.0)
Monocytes Absolute: 0.9 10*3/uL (ref 0.1–1.0)
Monocytes Relative: 8 %
Neutro Abs: 8.1 10*3/uL — ABNORMAL HIGH (ref 1.7–7.7)
Neutrophils Relative %: 78 %
Platelets: 223 10*3/uL (ref 150–400)
RBC: 5.53 MIL/uL (ref 4.22–5.81)
RDW: 24.6 % — ABNORMAL HIGH (ref 11.5–15.5)
WBC: 10.5 10*3/uL (ref 4.0–10.5)
nRBC: 0 % (ref 0.0–0.2)

## 2019-10-31 ENCOUNTER — Telehealth: Payer: Self-pay

## 2019-10-31 NOTE — Telephone Encounter (Signed)
Message left notifying patient that it is time to schedule the low dose lung cancer screening CT scan.  Instructed patient to return call to Shawn Perkins at [phone] to verify information prior to CT scan being scheduled.    

## 2019-11-12 ENCOUNTER — Other Ambulatory Visit: Payer: Self-pay

## 2019-11-12 ENCOUNTER — Inpatient Hospital Stay: Payer: Medicare PPO | Attending: Oncology

## 2019-11-12 DIAGNOSIS — D631 Anemia in chronic kidney disease: Secondary | ICD-10-CM | POA: Insufficient documentation

## 2019-11-12 DIAGNOSIS — F17210 Nicotine dependence, cigarettes, uncomplicated: Secondary | ICD-10-CM | POA: Diagnosis not present

## 2019-11-12 DIAGNOSIS — M069 Rheumatoid arthritis, unspecified: Secondary | ICD-10-CM | POA: Insufficient documentation

## 2019-11-12 DIAGNOSIS — I951 Orthostatic hypotension: Secondary | ICD-10-CM | POA: Insufficient documentation

## 2019-11-12 DIAGNOSIS — N183 Chronic kidney disease, stage 3 unspecified: Secondary | ICD-10-CM | POA: Diagnosis present

## 2019-11-12 DIAGNOSIS — D500 Iron deficiency anemia secondary to blood loss (chronic): Secondary | ICD-10-CM | POA: Insufficient documentation

## 2019-11-12 DIAGNOSIS — Z7901 Long term (current) use of anticoagulants: Secondary | ICD-10-CM | POA: Diagnosis not present

## 2019-11-12 DIAGNOSIS — I129 Hypertensive chronic kidney disease with stage 1 through stage 4 chronic kidney disease, or unspecified chronic kidney disease: Secondary | ICD-10-CM | POA: Diagnosis present

## 2019-11-12 DIAGNOSIS — C9001 Multiple myeloma in remission: Secondary | ICD-10-CM | POA: Insufficient documentation

## 2019-11-12 DIAGNOSIS — Z79899 Other long term (current) drug therapy: Secondary | ICD-10-CM | POA: Diagnosis not present

## 2019-11-12 DIAGNOSIS — E861 Hypovolemia: Secondary | ICD-10-CM | POA: Diagnosis not present

## 2019-11-12 DIAGNOSIS — Z8546 Personal history of malignant neoplasm of prostate: Secondary | ICD-10-CM | POA: Insufficient documentation

## 2019-11-12 LAB — CBC WITH DIFFERENTIAL/PLATELET
Abs Immature Granulocytes: 0.05 10*3/uL (ref 0.00–0.07)
Basophils Absolute: 0 10*3/uL (ref 0.0–0.1)
Basophils Relative: 0 %
Eosinophils Absolute: 0.3 10*3/uL (ref 0.0–0.5)
Eosinophils Relative: 3 %
HCT: 46 % (ref 39.0–52.0)
Hemoglobin: 14.4 g/dL (ref 13.0–17.0)
Immature Granulocytes: 1 %
Lymphocytes Relative: 12 %
Lymphs Abs: 1 10*3/uL (ref 0.7–4.0)
MCH: 25 pg — ABNORMAL LOW (ref 26.0–34.0)
MCHC: 31.3 g/dL (ref 30.0–36.0)
MCV: 79.9 fL — ABNORMAL LOW (ref 80.0–100.0)
Monocytes Absolute: 0.8 10*3/uL (ref 0.1–1.0)
Monocytes Relative: 9 %
Neutro Abs: 6.7 10*3/uL (ref 1.7–7.7)
Neutrophils Relative %: 75 %
Platelets: 233 10*3/uL (ref 150–400)
RBC: 5.76 MIL/uL (ref 4.22–5.81)
RDW: 22.6 % — ABNORMAL HIGH (ref 11.5–15.5)
WBC: 8.9 10*3/uL (ref 4.0–10.5)
nRBC: 0 % (ref 0.0–0.2)

## 2019-11-12 LAB — COMPREHENSIVE METABOLIC PANEL
ALT: 22 U/L (ref 0–44)
AST: 21 U/L (ref 15–41)
Albumin: 3.7 g/dL (ref 3.5–5.0)
Alkaline Phosphatase: 69 U/L (ref 38–126)
Anion gap: 8 (ref 5–15)
BUN: 23 mg/dL (ref 8–23)
CO2: 23 mmol/L (ref 22–32)
Calcium: 8.6 mg/dL — ABNORMAL LOW (ref 8.9–10.3)
Chloride: 106 mmol/L (ref 98–111)
Creatinine, Ser: 1.9 mg/dL — ABNORMAL HIGH (ref 0.61–1.24)
GFR calc Af Amer: 39 mL/min — ABNORMAL LOW (ref >60)
GFR calc non Af Amer: 33 mL/min — ABNORMAL LOW (ref >60)
Glucose, Bld: 94 mg/dL (ref 70–99)
Potassium: 5 mmol/L (ref 3.5–5.1)
Sodium: 137 mmol/L (ref 135–145)
Total Bilirubin: 0.5 mg/dL (ref 0.3–1.2)
Total Protein: 8 g/dL (ref 6.5–8.1)

## 2019-11-12 LAB — FERRITIN: Ferritin: 22 ng/mL — ABNORMAL LOW (ref 24–336)

## 2019-11-12 LAB — IRON AND TIBC
Iron: 25 ug/dL — ABNORMAL LOW (ref 45–182)
Saturation Ratios: 6 % — ABNORMAL LOW (ref 17.9–39.5)
TIBC: 388 ug/dL (ref 250–450)
UIBC: 363 ug/dL

## 2019-11-13 LAB — KAPPA/LAMBDA LIGHT CHAINS
Kappa free light chain: 143.7 mg/L — ABNORMAL HIGH (ref 3.3–19.4)
Kappa, lambda light chain ratio: 3.79 — ABNORMAL HIGH (ref 0.26–1.65)
Lambda free light chains: 37.9 mg/L — ABNORMAL HIGH (ref 5.7–26.3)

## 2019-11-14 ENCOUNTER — Inpatient Hospital Stay: Payer: Medicare PPO

## 2019-11-14 ENCOUNTER — Encounter: Payer: Self-pay | Admitting: Oncology

## 2019-11-14 ENCOUNTER — Inpatient Hospital Stay: Payer: Medicare PPO | Admitting: Oncology

## 2019-11-14 ENCOUNTER — Other Ambulatory Visit: Payer: Self-pay

## 2019-11-14 VITALS — BP 102/64 | HR 60 | Resp 18

## 2019-11-14 VITALS — BP 84/59 | HR 76 | Temp 96.8°F | Wt 197.3 lb

## 2019-11-14 DIAGNOSIS — D500 Iron deficiency anemia secondary to blood loss (chronic): Secondary | ICD-10-CM

## 2019-11-14 DIAGNOSIS — C9001 Multiple myeloma in remission: Secondary | ICD-10-CM | POA: Diagnosis not present

## 2019-11-14 DIAGNOSIS — E861 Hypovolemia: Secondary | ICD-10-CM | POA: Diagnosis not present

## 2019-11-14 DIAGNOSIS — I9589 Other hypotension: Secondary | ICD-10-CM

## 2019-11-14 DIAGNOSIS — D509 Iron deficiency anemia, unspecified: Secondary | ICD-10-CM

## 2019-11-14 DIAGNOSIS — I129 Hypertensive chronic kidney disease with stage 1 through stage 4 chronic kidney disease, or unspecified chronic kidney disease: Secondary | ICD-10-CM | POA: Diagnosis not present

## 2019-11-14 MED ORDER — SODIUM CHLORIDE 0.9 % IV SOLN
Freq: Once | INTRAVENOUS | Status: AC
  Filled 2019-11-14: qty 250

## 2019-11-14 MED ORDER — IRON SUCROSE 20 MG/ML IV SOLN
200.0000 mg | Freq: Once | INTRAVENOUS | Status: AC
  Administered 2019-11-14: 200 mg via INTRAVENOUS
  Filled 2019-11-14: qty 10

## 2019-11-14 MED ORDER — SODIUM CHLORIDE 0.9 % IV SOLN
Freq: Once | INTRAVENOUS | Status: AC
  Administered 2019-11-14: 500 mL via INTRAVENOUS
  Filled 2019-11-14: qty 250

## 2019-11-14 NOTE — Progress Notes (Signed)
Hematology/Oncology Follow Up Note [facility] Telephone:(336) [phone] Fax:(336) [phone]  Patient Care Team: Jodi Marble, MD as PCP - General (Internal Medicine) Bary Castilla, Forest Gleason, MD (General Surgery) Edrick Kins, MD as Rounding Team (Internal Medicine) Hillary Bow, MD (Inactive) as Consulting Physician (Internal Medicine) [name], MD as Surgeon (Gastroenterology) Isaias Cowman, MD as Consulting Physician (Cardiology) Earlie Server, MD as Consulting Physician (Oncology)  REFERRING PROVIDER: Dr.Lateef, [place]  REASON FOR VISIT Follow up for management of plasma cell dyscrasia.  And iron deficiency anemia.   HISTORY OF PRESENTING ILLNESS:  79 y.o.  male with PMH listed below who presents to follow up on the evaluation and management of his abnormal urine protein electrophoresis results. I reviewed the records from [facility], [place] and [place] was performed and HemOnc related medical problems are listed below.  He lives with a friend. He has 3 adult children.   1 Chronic Kidney disease, Stage III: patient follows up with Dr.Lateef.  2 Proteinuria:  02/18/2017: Albumin/Creatinine ratio was 7, Urine protein electrophoresis,Random Urine revealed M Spike 43.2%, total protein of 43.4m/dl,  01/05/2017 Albumin/Creatinine ratio was 455.2, Albumin 996.5, , 2.15.2018 Albumin/Creatinine ratio was 80.3, Albumin 73.3, Urine protein electrophoresis,Random Urine revealed M Spike 29.7%, total protein of 29.160mdl, 06/30/2016 Serum protein electrophoresis did not detect M spike.  Autoimmune disorder work up showed Negative anti dsDNA, RNP antibodies, smith antibody, Sjogren antibodies, anti Jo-1, positive antichromotin antibodies, positive ANA,  3 Anemia of chronic kidney disease:  4 Iron deficiency anemia: history of iron deficiency, ferritin was 7 on 08/11/2016. He had EGD and colonoscopy that were done  this year which showed duodenitis/esphagitis/diverticulosis/benigh polyps. # small bowel AVMs [facility]  5 Rheumatoid arthritis: he follows up with Dr.Kernodle and is on chronic MTX. He Is currently off MTX reports joint pains are controlled, not quite symptomatic.   Patient denies any persistent bone pain or any pain. He continue to feel lack of energy, persistent, not improved with resting or taking naps. He also has lost 25 pounds in the past year which was unintentional.   # Rheumatoid arthritis and currently is off MTX.  # It is ambiguous whether he has truly symptomatic multiple myeloma or a smoldering myeloma, given that anemia can be secondary to iron deficiency and CKD.  The hypermetabolic 2.1 cm hypermetabolic soft tissue lesion in the left heel, can reflect a Plasmacytoma,which should have been biopsied. However patient initially denies being on any blood thinner, later he was found to be on Plavix with pre biopsy questionnaire screening, and biopsy was held. Patient has to contact cardiologist to hold plavix for seven days before procedure. He feels frustrated about multiple workup and meanwhile he becomes more fatigues, with worsening of kidney function. Patient was reluctant about proceeding additional invasive procedures and wants to start treatment. Decision was made to start active MM treatment.   # [facility]. He is not considered to  good candidate for transplant. Patient cardiologist Dr. KaChancy Milroyad started patient on Lasix 20 mg daily. [facility] as patient was having dizziness and borderline blood pressure during his clinic visit there.  10/02/2018 He had follow-up left foot/ankle x-ray as a follow-up of his left heel lesion which was initially presented on PET scan and not detectable on follow-up PET scan.  Images were reviewed by me.  No  suspicious acute or subacute osseous abnormality  Current Treatment:  S/p RVD x 4 cycles tolerates well except cytopenia.  Revlimid 10 mg PO once per day on days 1 to 14 (dose reduced due to GFR) Revlimid renal dosing of 43m,  Velcade 1.3 mg/m2 IV once per day on days 1,  8, 15 Dexamethasone 20 mg PO once per day on days 1, 8, 15 given patient's diabetes Given patient's multiple comorbidity and the side effects of cytopenia during his RVD treatment and the uncertainty of this is really a site of plasmacytoma or not, I recommend not to continue him on maintenance Revlimid.   # repeat small bowel capsule study on 02/16/2019 Which showed multiple AVMs seen in the mid jejunum proximal to the site of prior tattoo. # [facility]2019 double-balloon enteroscopy with findings of 5 AVMs in the jejunum and 2 AVMs in the duodenum and AVMs were ablated with APC.  Tattoo was seen in the area. If further bleeding, recommend to consider treatment with octreotide. Patient takes Protonix daily.    INTERVAL HISTORY Patient presents for follow-up of management of multiple myeloma/smoldering multiple myeloma and iron deficiency anemia.Patient is not currently on any maintenance treatments. Patient reports feeling well. He was off Eliquis 2.5 mg twice daily.  Recently restarted per cardiology's recommendation. Denies hematochezia, hematuria, hematemesis, epistaxis, black tarry stool or easy bruising.  His blood pressure is low today in the clinic.  BP was 84/59.  Patient denies any lightheaded or dizziness.  He reports that his blood pressure was good when he saw cardiologist yesterday.  Review of Systems  Constitutional: Negative for appetite change, chills, fatigue, fever and unexpected weight change.  HENT:   Negative for hearing loss and voice change.   Eyes: Negative for eye problems and icterus.  Respiratory: Negative for chest tightness, cough and shortness of  breath.   Cardiovascular: Negative for chest pain and leg swelling.  Gastrointestinal: Negative for abdominal distention, abdominal pain and diarrhea.  Endocrine: Negative for hot flashes.  Genitourinary: Negative for difficulty urinating, dysuria and frequency.   Musculoskeletal: Negative for arthralgias.  Skin: Negative for itching and rash.  Neurological: Negative for light-headedness and numbness.  Hematological: Negative for adenopathy. Does not bruise/bleed easily.  Psychiatric/Behavioral: Negative for confusion.     MEDICAL HISTORY:  Past Medical History:  Diagnosis Date  . Anxiety   . Arthritis    rheumatoid  . Atrial fibrillation (HCC)    hx of  . Cancer ([place]   . Chronic kidney disease   . Colon polyp 07-07-15   TUBULAR ADENOMA WITH AT LEAST HIGH-GRADE / Dr RRayann Heman . Diabetes mellitus without complication [place]   . Dysrhythmia    bradycardia....Marland KitchenMarland Kitchen to 1 heart block  . GERD (gastroesophageal reflux disease)   . Heart murmur    patient unaware of history of murmur  . Hypercholesterolemia   . Hypertension   . Presence of permanent cardiac pacemaker   . [place], 01/30/14   Gleason 3+4=7, volume 46.6 cc  . Rheumatoid arthritis ([place]   . S/P radiation therapy  04/03/2014 through 06/04/2014                                                      Prostate 7800 cGy in 40 sessions                          .  Sleep apnea    uses cpap but it is not in the hospital with him    SURGICAL HISTORY: Past Surgical History:  Procedure Laterality Date  . ABDOMINAL AORTIC ANEURYSM REPAIR  11/2013  . COLON SURGERY  March 2017   Right hemicolectomy for tubulovillous adenoma with high-grade dysplasia.  . COLONOSCOPY WITH PROPOFOL N/A 07/07/2015   Procedure: COLONOSCOPY WITH PROPOFOL;  Surgeon: Josefine Class, MD;  Location: [facility] ENDOSCOPY;  Service: Endoscopy;  Laterality: N/A;  . COLONOSCOPY WITH PROPOFOL N/A 10/28/2016   Procedure: COLONOSCOPY WITH PROPOFOL;   Surgeon: [name], MD;  Location: [facility] ENDOSCOPY;  Service: Endoscopy;  Laterality: N/A;  . ESOPHAGOGASTRODUODENOSCOPY (EGD) WITH PROPOFOL N/A 10/28/2016   Procedure: ESOPHAGOGASTRODUODENOSCOPY (EGD) WITH PROPOFOL;  Surgeon: [name], MD;  Location: [facility] ENDOSCOPY;  Service: Endoscopy;  Laterality: N/A;  . GIVENS CAPSULE STUDY N/A 07/25/2017   Procedure: GIVENS CAPSULE STUDY;  Surgeon: [name], MD;  Location: [facility] ENDOSCOPY;  Service: Gastroenterology;  Laterality: N/A;  . GIVENS CAPSULE STUDY N/A 01/29/2019   Procedure: GIVENS CAPSULE STUDY;  Surgeon: [name], MD;  [facility];  Service: Gastroenterology;  Laterality: N/A;  . LAPAROSCOPIC RIGHT COLECTOMY Right 08/08/2015   Procedure: LAPAROSCOPIC RIGHT COLECTOMY;  Surgeon: Robert Bellow, MD;  Location: [facility] ORS;  Service: General;  Laterality: Right;  . NASAL SINUS SURGERY    . PACEMAKER INSERTION Left 08/26/2016   Procedure: INSERTION PACEMAKER;  Surgeon: Isaias Cowman, MD;  Location: [facility] ORS;  Service: Cardiovascular;  Laterality: Left;  . PROSTATE BIOPSY  01/01/13, 01/30/14   Gleason 3+3=6, vol 46.6 cc  . TONSILLECTOMY    . uvula surgery     for sleep apnea    SOCIAL HISTORY: Social History   Socioeconomic History  . Marital status: Divorced    Spouse name: Not on file  . Number of children: Not on file  . Years of education: Not on file  . Highest education level: Not on file  Occupational History  . Not on file  Tobacco Use  . Smoking status: Current Every Day Smoker    Packs/day: 1.00    Years: 60.00    Pack years: 60.00    Types: Cigarettes  . Smokeless tobacco: Never Used  . Tobacco comment: DOWN TO 1/2 PPD  Substance and Sexual Activity  . Alcohol use: Yes    Alcohol/week: 1.0 standard drinks    Types: 1 Cans of beer per week    Comment: occasionally  . Drug use: No  . Sexual activity: Not Currently  Other Topics Concern  . Not on file  Social History Narrative  . Not on file    Social Determinants of Health   Financial Resource Strain:   . Difficulty of Paying Living Expenses:   Food Insecurity:   . Worried About Charity fundraiser in the Last Year:   . Arboriculturist in the Last Year:   Transportation Needs:   . Film/video editor (Medical):   Marland Kitchen Lack of Transportation (Non-Medical):   Physical Activity:   . Days of Exercise per Week:   . Minutes of Exercise per Session:   Stress:   . Feeling of Stress :   Social Connections:   . Frequency of Communication with Friends and Family:   . Frequency of Social Gatherings with Friends and Family:   . Attends Religious Services:   . Active Member of Clubs or Organizations:   . Attends Archivist Meetings:   Marland Kitchen Marital  Status:   Intimate Partner Violence:   . Fear of Current or Ex-Partner:   . Emotionally Abused:   Marland Kitchen Physically Abused:   . Sexually Abused:     FAMILY HISTORY: Family History  Problem Relation Age of Onset  . Heart attack Mother   . Cirrhosis Father   . Pancreatic cancer Brother   . Diabetes Brother   . Diabetes Daughter        medication induced for cancer treatments  . Cancer Daughter        breast, brain    ALLERGIES:  has No Known Allergies.  MEDICATIONS:  Current Outpatient Medications  Medication Sig Dispense Refill  . acetaminophen (TYLENOL) 325 MG tablet Take 325 mg by mouth every 6 (six) hours as needed.    Marland Kitchen albuterol (PROVENTIL HFA;VENTOLIN HFA) 108 (90 Base) MCG/ACT inhaler     . ALPRAZolam (XANAX) 0.25 MG tablet Take by mouth 2 (two) times daily as needed.     Marland Kitchen amLODipine-benazepril (LOTREL) 5-20 MG capsule Take 1 capsule by mouth daily.    Marland Kitchen apixaban (ELIQUIS) 2.5 MG TABS tablet Take by mouth.    Marland Kitchen atorvastatin (LIPITOR) 80 MG tablet Take by mouth.    . benazepril (LOTENSIN) 20 MG tablet Take 20 mg by mouth daily.    . Butalbital-APAP-Caffeine (FIORICET) 50-300-40 MG CAPS Take 1 capsule every four to six hours as needed    . Cholecalciferol 1.25  MG (50000 UT) capsule Take by mouth.    . clopidogrel (PLAVIX) 75 MG tablet Take by mouth.    . colchicine 0.6 MG tablet Take by mouth.    . ezetimibe (ZETIA) 10 MG tablet Take 1 tablet by mouth 1 day or 1 dose.    . fexofenadine (ALLEGRA) 180 MG tablet Take by mouth.    . fluticasone (FLONASE) 50 MCG/ACT nasal spray Place 2 sprays into both nostrils daily as needed for rhinitis.     . Fluticasone Furoate-Vilanterol (BREO ELLIPTA) 100-25 MCG/INH AEPB Inhale 1 puff into the lungs daily.     Marland Kitchen gabapentin (NEURONTIN) 400 MG capsule Take by mouth.    Marland Kitchen glucose blood (ONETOUCH ULTRA) test strip     . hydroxychloroquine (PLAQUENIL) 200 MG tablet Take by mouth.    . isosorbide mononitrate (IMDUR) 30 MG 24 hr tablet Take 30 mg by mouth daily.    Marland Kitchen JARDIANCE 25 MG TABS tablet     . loperamide (IMODIUM) 2 MG capsule Take 1 capsule (2 mg total) by mouth See admin instructions. With onset of diarrhea, take 44m followed by 221mevery 2 hours resolved. Maximum: 16 mg/day 120 capsule 0  . metoprolol succinate (TOPROL-XL) 25 MG 24 hr tablet Take 25 mg by mouth daily.    . ondansetron (ZOFRAN) 8 MG tablet Take 1 tablet (8 mg total) 2 (two) times daily as needed by mouth (Nausea or vomiting). 30 tablet 1  . pantoprazole (PROTONIX) 20 MG tablet Take 20 mg by mouth daily.    . tamsulosin (FLOMAX) 0.4 MG CAPS capsule Take 1 capsule (0.4 mg total) by mouth daily after supper. 90 capsule 3  . tiZANidine (ZANAFLEX) 2 MG tablet Take 2 mg by mouth 3 (three) times daily.    . traMADol-acetaminophen (ULTRACET) 37.5-325 MG tablet     . triamcinolone cream (KENALOG) 0.1 % Apply 1 application topically 2 (two) times daily.    . folic acid (FOLVITE) 1 MG tablet Take by mouth.    . furosemide (LASIX) 20 MG tablet Take 20 mg  by mouth daily.    . metFORMIN (GLUCOPHAGE) 500 MG tablet Take 500 mg by mouth daily with breakfast.     . UNABLE TO FIND SMARTSIG:1 Capsule(s) By Mouth Once a Week     No current facility-administered  medications for this visit.      Marland Kitchen  PHYSICAL EXAMINATION: ECOG PERFORMANCE STATUS: 1 - Symptomatic but completely ambulatory Vitals:   11/14/19 1402  BP: (!) 84/59  Pulse: 76  Temp: (!) 96.8 F (36 C)  SpO2: 96%   Filed Weights   11/14/19 1402  Weight: 197 lb 4.8 oz (89.5 kg)   Physical Exam  Constitutional: He is oriented to person, place, and time and well-developed, well-nourished, and in no distress. No distress.  HENT:  Head: Normocephalic and atraumatic.  Nose: Nose normal.  Mouth/Throat: Oropharynx is clear and moist. No oropharyngeal exudate.  Eyes: Pupils are equal, round, and reactive to light. EOM are normal. Right eye exhibits no discharge. Left eye exhibits no discharge. No scleral icterus.  Neck: No JVD present.  Cardiovascular: Normal rate, regular rhythm and normal heart sounds. Exam reveals no friction rub.  No murmur heard. Pulmonary/Chest: Effort normal and breath sounds normal. No respiratory distress. He has no wheezes. He has no rales. He exhibits no tenderness.  Abdominal: Soft. Bowel sounds are normal. He exhibits no distension and no mass. There is no abdominal tenderness.  Musculoskeletal:        General: No tenderness or edema. Normal range of motion.     Cervical back: Normal range of motion and neck supple.  Lymphadenopathy:    He has no cervical adenopathy.  Neurological: He is alert and oriented to person, place, and time.  Skin: Skin is warm and dry. He is not diaphoretic. No erythema.  Hypopigmentation of skin  Psychiatric: Affect normal.    LABORATORY DATA:  I have reviewed the data as listed Lab Results  Component Value Date   WBC 8.9 11/12/2019   HGB 14.4 11/12/2019   HCT 46.0 11/12/2019   MCV 79.9 (L) 11/12/2019   PLT 233 11/12/2019   Recent Labs    12/18/18 1344 09/17/19 1057 11/12/19 1128  NA 138 138 137  K 4.3 4.6 5.0  CL 111 108 106  CO2 '23 24 23  ' GLUCOSE 95 93 94  BUN '22 21 23  ' CREATININE 1.49* 1.51* 1.90*    CALCIUM 8.7* 8.6* 8.6*  GFRNONAA 45* 44* 33*  GFRAA 52* 51* 39*  PROT 8.1 7.5 8.0  ALBUMIN 3.2* 3.4* 3.7  AST 14* 15 21  ALT '14 14 22  ' ALKPHOS 58 55 69  BILITOT 0.4 0.5 0.5    SPEP: no M spike Serum free light chain Kappa/Lamda ratio 13.04 UPEP  Free light chain Kappa/lamda ratio 162.43, M spike 122m/24 hour   Bone marrow biopsy 03/21/2017  Bone Marrow, Aspirate,Biopsy, and Clot, left iliac - HYPERCELLULAR BONE MARROW FOR AGE WITH TRILINEAGE HEMATOPOIESIS. - PLASMACYTOSIS (PLASMA CELLS 8%)  Karyotype: loss of Y chromosome Cytogenetic MDS FISH Panel negative.   Bone marrow 04/06/2017  Bone Marrow, Aspirate,Biopsy, and Clot, right iliac and core - HYPERCELLULAR BONE MARROW FOR AGE WITH PLASMA CELL NEOPLASM - TRILINEAGE HEMATOPOIESIS. - SEE COMMENT. PERIPHERAL BLOOD: - MICROCYTIC-HYPOCHROMIC ANEMIA. Diagnosis Note The bone marrow is hypercellular with trilineage hematopoiesis but with relative abundance of erythroid precursors and increased number of megakaryocytes with nonspecific changes. Significant dyspoiesis is not seen. Iron stores are present with no ring sideroblasts. The plasma cells are increased in number representing 8%  of all cells in the aspirate although focal areas in the core biopsy show 10 to 20% as primarily seen by CD138 stain. In situ hybridization for kappa and lambda light chains show kappa light chain restriction consistent with plasma cell neoplasm. Correlation with cytogenetic and FISH studies is recommended. (BNS:ecj/gt 04/07/2017)  IMAGE STUDIES I have personally reviewed below image results.  03/15/2017 DG bone survey Met: Nonspecific lucent lesion in the distal right ulna measuring 15-16 mm, but otherwise normal bone mineralization for age throughout the visible skeleton. A solitary lytic lesion in a distal extremity would be an unusual presentation of multiple myeloma, and I favor the distal right ulna lesion is benign. Recommend correlation  with serum and urine protein electrophoresis.  04/14/2017 PET scan: 1. 2.1 cm hypermetabolic soft tissue lesion in the left heel, adjacent to the calcaneal tuberosity. 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 2. Mottled FDG accumulation diffusely in the marrow space without other frankly overt hypermetabolic bony lesion. 3. Coronary artery and thoracoabdominal aortic atherosclerosis with abdominal aortic stent graft visualized in situ. 4. Bilateral renal cysts and bilateral nonobstructing renal stones.  08/24/2017 PET scan 1. No hypermetabolic osseous lesions. No definite signs of multiple myeloma on CT imaging. 2. Interval improvement in soft tissue activity along the medial aspect of the left calcaneal tuberosity, likely plantar fasciitis. 3. New small left pleural effusion. 4. Bilateral inguinal hernias containing small bowel on the right and sigmoid colon on the left. No evidence of incarceration or obstruction. 5. Otherwise stable incidental findings including diffuse atherosclerosis, bilateral renal cysts, emphysema and sigmoid diverticulosis.  ASSESSMENT & PLAN:  1. Iron deficiency anemia due to chronic blood loss   2. Multiple myeloma in remission ([place])   3. Hypotension due to hypovolemia    #Iron deficiency anemia secondary to chronic blood loss due to multiple AVM in small bowels. Status post enteroscopy at [facility] for AVM ablation. Labs are reviewed and discussed with patient. Hemoglobin has improved to 14.4.  ferritin improved to 22.  Iron saturation still decreased at 6% Since he has restarted on Eliquis 2.5 mg twice daily, I will give him 1 more dose of Venofer 200 mg x 1.  #Hypotension, orthostatic.  Likely due to decreased oral intake. Patient will receive 500 cc IV fluid today in the clinic. Repeat blood pressure has improved. Patient was released home. Discussed with patient asking to measure his blood pressure tomorrow before taking medication.  If systolic blood  pressures less than 120, he showed continue to hold his blood pressure medication.  Also advised patient to continue follow-up with cardiology and primary care provider for monitoring of blood pressure.   #Smoldering multiple myeloma/active myeloma if previous hypermetabolic lesion on PET scan was related to myeloma. Patient was treated with 4 cycles of RVD.  He is not currently on any maintenance therapy due to multiple comorbidities, borderline smoldering multiple myeloma status.  Multiple myeloma panel also stable.  Discussed with him.  #History of prostate cancer, patient reports that he is not currently following up with anyone.  PSA is stable.  Follow-up in 8 weeks with reevaluation of blood work and MD reassessment.  Orders Placed This Encounter  Procedures  . CBC with Differential/Platelet    Standing Status:   Future    Standing Expiration Date:   11/13/2020  . Ferritin    Standing Status:   Future    Standing Expiration Date:   11/13/2020  . Iron and TIBC    Standing Status:   Future  Standing Expiration Date:   11/13/2020    Earlie Server, MD, PhD Hematology Oncology [facility] Pager- [phone] 11/14/2019

## 2019-11-14 NOTE — Progress Notes (Signed)
MD, Dr. Tasia Catchings, aware of vital signs. Per MD order: patient to receive 0.9% Sodium Chloride infusion, see MAR; and per MD, okay to proceed with scheduled Venofer treatment at this time as well.  1545- Blood Pressure: 102/64 post 0.9% Sodium Chloride infusion and Venofer infusion. MD, Dr. Tasia Catchings, notified and aware. Per MD order: patient can be discharged to home at this time.

## 2019-11-14 NOTE — Progress Notes (Signed)
Patient here for follow up. Pt was restarted on eliquis 2.5 yesterday. Pt with low BP (re-measured 3 times), reports feeling tired.

## 2019-11-15 LAB — MULTIPLE MYELOMA PANEL, SERUM
Albumin SerPl Elph-Mcnc: 3.5 g/dL (ref 2.9–4.4)
Albumin/Glob SerPl: 1 (ref 0.7–1.7)
Alpha 1: 0.2 g/dL (ref 0.0–0.4)
Alpha2 Glob SerPl Elph-Mcnc: 0.8 g/dL (ref 0.4–1.0)
B-Globulin SerPl Elph-Mcnc: 1 g/dL (ref 0.7–1.3)
Gamma Glob SerPl Elph-Mcnc: 1.8 g/dL (ref 0.4–1.8)
Globulin, Total: 3.8 g/dL (ref 2.2–3.9)
IgA: 243 mg/dL (ref 61–437)
IgG (Immunoglobin G), Serum: 2016 mg/dL — ABNORMAL HIGH (ref 603–1613)
IgM (Immunoglobulin M), Srm: 93 mg/dL (ref 15–143)
Total Protein ELP: 7.3 g/dL (ref 6.0–8.5)

## 2019-11-22 ENCOUNTER — Ambulatory Visit: Payer: Medicare PPO | Admitting: Podiatry

## 2019-11-22 ENCOUNTER — Other Ambulatory Visit: Payer: Self-pay

## 2019-11-22 ENCOUNTER — Encounter: Payer: Self-pay | Admitting: Podiatry

## 2019-11-22 DIAGNOSIS — B351 Tinea unguium: Secondary | ICD-10-CM | POA: Diagnosis not present

## 2019-11-22 DIAGNOSIS — I739 Peripheral vascular disease, unspecified: Secondary | ICD-10-CM | POA: Diagnosis not present

## 2019-11-22 DIAGNOSIS — D689 Coagulation defect, unspecified: Secondary | ICD-10-CM

## 2019-11-22 DIAGNOSIS — M79609 Pain in unspecified limb: Secondary | ICD-10-CM | POA: Diagnosis not present

## 2019-11-22 NOTE — Progress Notes (Signed)
This patient returns to my office for at risk foot care.  This patient requires this care by a professional since this patient will be at risk due to having PAD and diabetes. And coagulation defect.  Patient is taking eliquiss.  This patient is unable to cut nails himself since the patient cannot reach his nails.These nails are painful walking and wearing shoes.  This patient presents for at risk foot care today.  General Appearance  Alert, conversant and in no acute stress.  Vascular  Dorsalis pedis and posterior tibial  pulses are not  palpable  bilaterally.  Capillary return is within normal limits  bilaterally. Temperature is within normal limits  bilaterally.  Neurologic  Senn-Weinstein monofilament wire test within normal limits  bilaterally. Muscle power within normal limits bilaterally.  Nails Thick disfigured discolored nails with subungual debris  from hallux to fifth toes bilaterally. No evidence of bacterial infection or drainage bilaterally.  Orthopedic  No limitations of motion  feet .  No crepitus or effusions noted.  No bony pathology or digital deformities noted.  HAV wuth hammer toe  B/L.  Rigid contracted second digit right foot.  Skin  normotropic skin with no porokeratosis noted bilaterally.  No signs of infections or ulcers noted.     Onychomycosis  Pain in right toes  Pain in left toes  Consent was obtained for treatment procedures.   Mechanical debridement of nails 1-5  bilaterally performed with a nail nipper.  Filed with dremel without incident.    Return office visit    10 weeks                 Told patient to return for periodic foot care and evaluation due to potential at risk complications.   Gardiner Barefoot DPM

## 2019-11-23 ENCOUNTER — Telehealth: Payer: Self-pay | Admitting: *Deleted

## 2019-11-23 DIAGNOSIS — Z122 Encounter for screening for malignant neoplasm of respiratory organs: Secondary | ICD-10-CM

## 2019-11-23 DIAGNOSIS — Z87891 Personal history of nicotine dependence: Secondary | ICD-10-CM

## 2019-11-23 NOTE — Telephone Encounter (Signed)
(  11/23/2019) Pt has been notified that lung cancer screening CT scan is due currently or will be in near future. Confirmed pt is within appropriate age range, and asymptomatic. Pt denies illness that would prevent curative treatment for lung cancer if found. Verified smoking history (Current Smoker,1 ppd ). Pt did receive 2nd COVID VX on (07/16/19) [CT to be scheduled approx. 4 weeks after vx date] Pt is agreeable for CT scan being scheduled, prefers an appt on a Monday morning (10am-11am) SRW

## 2019-11-26 NOTE — Addendum Note (Signed)
Addended by: Lieutenant Diego on: 11/26/2019 03:20 PM   Modules accepted: Orders

## 2019-11-26 NOTE — Telephone Encounter (Signed)
Smoking history: current, 61 pack year

## 2019-12-17 ENCOUNTER — Ambulatory Visit
Admission: RE | Admit: 2019-12-17 | Discharge: 2019-12-17 | Disposition: A | Discharge status: Home / Self Care | Payer: Medicare PPO | Source: Ambulatory Visit | Attending: Oncology | Admitting: Oncology

## 2019-12-17 ENCOUNTER — Other Ambulatory Visit: Payer: Self-pay

## 2019-12-17 DIAGNOSIS — Z87891 Personal history of nicotine dependence: Secondary | ICD-10-CM

## 2019-12-17 DIAGNOSIS — Z122 Encounter for screening for malignant neoplasm of respiratory organs: Secondary | ICD-10-CM | POA: Diagnosis present

## 2019-12-21 ENCOUNTER — Encounter: Payer: Self-pay | Admitting: *Deleted

## 2020-01-01 ENCOUNTER — Ambulatory Visit (INDEPENDENT_AMBULATORY_CARE_PROVIDER_SITE_OTHER): Payer: Medicare PPO | Admitting: Vascular Surgery

## 2020-01-01 ENCOUNTER — Ambulatory Visit (INDEPENDENT_AMBULATORY_CARE_PROVIDER_SITE_OTHER): Payer: Medicare PPO

## 2020-01-01 ENCOUNTER — Other Ambulatory Visit: Payer: Self-pay

## 2020-01-01 ENCOUNTER — Encounter (INDEPENDENT_AMBULATORY_CARE_PROVIDER_SITE_OTHER): Payer: Self-pay | Admitting: Vascular Surgery

## 2020-01-01 VITALS — BP 78/49 | HR 87 | Resp 16 | Ht 71.0 in | Wt 199.0 lb

## 2020-01-01 DIAGNOSIS — E785 Hyperlipidemia, unspecified: Secondary | ICD-10-CM | POA: Diagnosis not present

## 2020-01-01 DIAGNOSIS — I10 Essential (primary) hypertension: Secondary | ICD-10-CM | POA: Diagnosis not present

## 2020-01-01 DIAGNOSIS — F17200 Nicotine dependence, unspecified, uncomplicated: Secondary | ICD-10-CM | POA: Diagnosis not present

## 2020-01-01 DIAGNOSIS — I714 Abdominal aortic aneurysm, without rupture, unspecified: Secondary | ICD-10-CM

## 2020-01-01 NOTE — Progress Notes (Signed)
MRN : [id number]  Omar [name] is a 79 y.o. (Nov 03, 1940) male who presents with chief complaint of  Chief Complaint  Patient presents with  . Follow-up    ultrasound  .  History of Present Illness: Patient returns today in follow up of his aneurysm.  He is about 5 to 6 years status post endovascular repair of his abdominal aortic aneurysm.  He is doing well without aneurysm related symptoms currently. Specifically, the patient denies new back or abdominal pain, or signs of peripheral embolization.  Duplex today shows slight decrease in the aneurysm sac size down to 4.0 cm in maximal diameter.  There is no endoleak present and the stent graft is widely patent.  Current Outpatient Medications  Medication Sig Dispense Refill  . acetaminophen (TYLENOL) 325 MG tablet Take 325 mg by mouth every 6 (six) hours as needed.    Marland Kitchen albuterol (PROVENTIL HFA;VENTOLIN HFA) 108 (90 Base) MCG/ACT inhaler     . ALPRAZolam (XANAX) 0.25 MG tablet Take by mouth 2 (two) times daily as needed.     Marland Kitchen amLODipine-benazepril (LOTREL) 5-20 MG capsule Take 1 capsule by mouth daily.    Marland Kitchen apixaban (ELIQUIS) 2.5 MG TABS tablet Take by mouth.    Marland Kitchen atorvastatin (LIPITOR) 80 MG tablet Take by mouth.    . benazepril (LOTENSIN) 20 MG tablet Take 20 mg by mouth daily.    . Butalbital-APAP-Caffeine (FIORICET) 50-300-40 MG CAPS Take 1 capsule every four to six hours as needed    . colchicine 0.6 MG tablet Take by mouth.    . ezetimibe (ZETIA) 10 MG tablet Take 1 tablet by mouth 1 day or 1 dose.    . fexofenadine (ALLEGRA) 180 MG tablet Take by mouth.    . fluticasone (FLONASE) 50 MCG/ACT nasal spray Place 2 sprays into both nostrils daily as needed for rhinitis.     . Fluticasone Furoate-Vilanterol (BREO ELLIPTA) 100-25 MCG/INH AEPB Inhale 1 puff into the lungs daily.     . furosemide (LASIX) 20 MG tablet Take 20 mg by mouth daily.    Marland Kitchen gabapentin (NEURONTIN) 400 MG capsule Take by mouth.    Marland Kitchen glucose blood (ONETOUCH  ULTRA) test strip     . hydroxychloroquine (PLAQUENIL) 200 MG tablet Take by mouth.    . isosorbide mononitrate (IMDUR) 30 MG 24 hr tablet Take 30 mg by mouth daily.    Marland Kitchen JARDIANCE 25 MG TABS tablet     . metoprolol succinate (TOPROL-XL) 25 MG 24 hr tablet Take 25 mg by mouth daily.    . pantoprazole (PROTONIX) 20 MG tablet Take 20 mg by mouth daily.    . tamsulosin (FLOMAX) 0.4 MG CAPS capsule Take 1 capsule (0.4 mg total) by mouth daily after supper. 90 capsule 3  . tiZANidine (ZANAFLEX) 2 MG tablet Take 2 mg by mouth 3 (three) times daily.    . traMADol-acetaminophen (ULTRACET) 37.5-325 MG tablet     . triamcinolone cream (KENALOG) 0.1 % Apply 1 application topically 2 (two) times daily.    . Cholecalciferol 1.25 MG (50000 UT) capsule Take by mouth. (Patient not taking: Reported on 01/01/2020)    . clopidogrel (PLAVIX) 75 MG tablet Take by mouth. (Patient not taking: Reported on 01/01/2020)    . loperamide (IMODIUM) 2 MG capsule Take 1 capsule (2 mg total) by mouth See admin instructions. With onset of diarrhea, take 53m followed by 276mevery 2 hours resolved. Maximum: 16 mg/day (Patient not taking: Reported on 01/01/2020) 120 capsule  0  . metFORMIN (GLUCOPHAGE) 500 MG tablet Take 500 mg by mouth daily with breakfast.     . ondansetron (ZOFRAN) 8 MG tablet Take 1 tablet (8 mg total) 2 (two) times daily as needed by mouth (Nausea or vomiting). (Patient not taking: Reported on 01/01/2020) 30 tablet 1  . UNABLE TO FIND SMARTSIG:1 Capsule(s) By Mouth Once a Week     No current facility-administered medications for this visit.    Past Medical History:  Diagnosis Date  . Anxiety   . Arthritis    rheumatoid  . Atrial fibrillation (HCC)    hx of  . Cancer ([place])   . Chronic kidney disease   . Colon polyp 07-07-15   TUBULAR ADENOMA WITH AT LEAST HIGH-GRADE / Dr Rayann Heman  . Diabetes mellitus without complication ([place])   . Dysrhythmia    bradycardia...Marland KitchenMarland Kitchen2 to 1 heart block  . GERD (gastroesophageal  reflux disease)   . Heart murmur    patient unaware of history of murmur  . Hypercholesterolemia   . Hypertension   . Presence of permanent cardiac pacemaker   . Prostate cancer ([place]) 01/01/13, 01/30/14   Gleason 3+4=7, volume 46.6 cc  . Rheumatoid arthritis ([place])   . S/P radiation therapy  04/03/2014 through 06/04/2014                                                      Prostate 7800 cGy in 40 sessions                          . Sleep apnea    uses cpap but it is not in the hospital with him    Past Surgical History:  Procedure Laterality Date  . ABDOMINAL AORTIC ANEURYSM REPAIR  11/2013  . COLON SURGERY  March 2017   Right hemicolectomy for tubulovillous adenoma with high-grade dysplasia.  . COLONOSCOPY WITH PROPOFOL N/A 07/07/2015   Procedure: COLONOSCOPY WITH PROPOFOL;  Surgeon: Josefine Class, MD;  Location: [facility] ENDOSCOPY;  Service: Endoscopy;  Laterality: N/A;  . COLONOSCOPY WITH PROPOFOL N/A 10/28/2016   Procedure: COLONOSCOPY WITH PROPOFOL;  Surgeon: [name], MD;  Location[facility] ENDOSCOPY;  Service: Endoscopy;  Laterality: N/A;  . ESOPHAGOGASTRODUODENOSCOPY (EGD) WITH PROPOFOL N/A 10/28/2016   Procedure: ESOPHAGOGASTRODUODENOSCOPY (EGD) WITH PROPOFOL;  Surgeon: [name], MD;  Location: [facility] ENDOSCOPY;  Service: Endoscopy;  Laterality: N/A;  . GIVENS CAPSULE STUDY N/A 07/25/2017   Procedure: GIVENS CAPSULE STUDY;  Surgeon: [name], MD;  Location: [facility] ENDOSCOPY;  Service: Gastroenterology;  Laterality: N/A;  . GIVENS CAPSULE STUDY N/A 01/29/2019   Procedure: GIVENS CAPSULE STUDY;  Surgeon: [name], MD;  Location: [facility] ENDOSCOPY;  Service: Gastroenterology;  Laterality: N/A;  . LAPAROSCOPIC RIGHT COLECTOMY Right 08/08/2015   Procedure: LAPAROSCOPIC RIGHT COLECTOMY;  Surgeon: Robert Bellow, MD;  Location: [facility] ORS;  Service: General;  Laterality: Right;  . NASAL SINUS SURGERY    . PACEMAKER INSERTION Left 08/26/2016   Procedure: INSERTION PACEMAKER;  Surgeon:  Isaias Cowman, MD;  Location: [facility] ORS;  Service: Cardiovascular;  Laterality: Left;  . PROSTATE BIOPSY  01/01/13, 01/30/14   Gleason 3+3=6, vol 46.6 cc  . TONSILLECTOMY    . uvula surgery     for sleep apnea     Social History   Tobacco Use  .  Smoking status: Current Every Day Smoker    Packs/day: 1.00    Years: 60.00    Pack years: 60.00    Types: Cigarettes  . Smokeless tobacco: Never Used  . Tobacco comment: DOWN TO 1/2 PPD  Vaping Use  . Vaping Use: Never used  Substance Use Topics  . Alcohol use: Yes    Alcohol/week: 1.0 standard drink    Types: 1 Cans of beer per week    Comment: occasionally  . Drug use: No      Family History  Problem Relation Age of Onset  . Heart attack Mother   . Cirrhosis Father   . Pancreatic cancer Brother   . Diabetes Brother   . Diabetes Daughter        medication induced for cancer treatments  . Cancer Daughter        breast, brain     No Known Allergies  REVIEW OF SYSTEMS(Negative unless checked)  Constitutional: '[]' ??Weight loss'[]' ??Fever'[]' ??Chills Cardiac:'[]' ??Chest pain'[]' ??Chest pressure'[]' ??Palpitations '[]' ??Shortness of breath when laying flat '[]' ??Shortness of breath at rest '[x]' ??Shortness of breath with exertion. Vascular: '[x]' ??Pain in legs with walking'[]' ??Pain in legsat rest'[]' ??Pain in legs when laying flat '[]' ??Claudication '[]' ??Pain in feet when walking '[]' ??Pain in feet at rest '[]' ??Pain in feet when laying flat '[]' ??History of DVT '[]' ??Phlebitis '[x]' ??Swelling in legs '[]' ??Varicose veins '[]' ??Non-healing ulcers Pulmonary: '[]' ??Uses home oxygen '[]' ??Productive cough'[]' ??Hemoptysis '[]' ??Wheeze '[]' ??COPD '[]' ??Asthma Neurologic: '[]' ??Dizziness '[]' ??Blackouts '[]' ??Seizures '[]' ??History of stroke '[]' ??History of TIA'[]' ??Aphasia '[]' ??Temporary blindness'[]' ??Dysphagia '[]' ??Weaknessor numbness in arms '[]' ??Weakness or numbnessin legs Musculoskeletal: '[]' ??Arthritis '[]' ??Joint  swelling '[]' ??Joint pain '[]' ??Low back pain Hematologic:'[]' ??Easy bruising'[]' ??Easy bleeding '[]' ??Hypercoagulable state '[]' ??Anemic  Gastrointestinal:'[x]' ??Blood in stool'[]' ??Vomiting blood'[]' ??Gastroesophageal reflux/heartburn'[x]' ??Abdominal pain Genitourinary: '[]' ??Chronic kidney disease '[]' ??Difficulturination '[]' ??Frequenturination '[]' ??Burning with urination'[]' ??Hematuria Skin: '[]' ??Rashes '[]' ??Ulcers '[]' ??Wounds Psychological: '[]' ??History of anxiety'[]' ??History of major depression.   Physical Examination  BP (!) 78/49 (BP Location: Right Arm)   Pulse 87   Resp 16   Ht '5\' 11"'  (1.803 m)   Wt 199 lb (90.3 kg)   BMI 27.75 kg/m  Gen:  WD/WN, NAD Head: [place]/AT, No temporalis wasting. Ear/Nose/Throat: Hearing grossly intact, nares w/o erythema or drainage Eyes: Conjunctiva clear. Sclera non-icteric Neck: Supple.  Trachea midline Pulmonary:  Good air movement, no use of accessory muscles.  Cardiac: RRR, no JVD Vascular:  Vessel Right Left  Radial Palpable Palpable       Musculoskeletal: M/S 5/5 throughout.  No deformity or atrophy. No edema. Neurologic: Sensation grossly intact in extremities.  Symmetrical.  Speech is fluent.  Psychiatric: Judgment intact, Mood & affect appropriate for pt's clinical situation. Dermatologic: No rashes or ulcers noted.  No cellulitis or open wounds.       Labs Recent Results (from the past 2160 hour(s))  Urinalysis, Complete     Status: Abnormal   Collection Time: 10/03/19  1:28 PM  Result Value Ref Range   Specific Gravity, UA 1.020 1.005 - 1.030   pH, UA 5.5 5.0 - 7.5   Color, UA Yellow Yellow   Appearance Ur Clear Clear   Leukocytes,UA Negative Negative   Protein,UA Negative Negative/Trace   Glucose, UA 3+ (A) Negative   Ketones, UA Negative Negative   RBC, UA Negative Negative   Bilirubin, UA Negative Negative   Urobilinogen, Ur 0.2 0.2 - 1.0 mg/dL   Nitrite, UA Negative Negative   Microscopic Examination  See below:   Microscopic Examination     Status: None   Collection Time: 10/03/19  1:28 PM   URINE  Result Value Ref Range   WBC, UA 0-5 0 -  5 /hpf   RBC None seen 0 - 2 /hpf   Epithelial Cells (non renal) None seen 0 - 10 /hpf   Bacteria, UA None seen None seen/Few  CBC with Differential/Platelet     Status: Abnormal   Collection Time: 10/11/19 11:05 AM  Result Value Ref Range   WBC 9.7 4.0 - 10.5 K/uL   RBC 5.61 4.22 - 5.81 MIL/uL   Hemoglobin 13.7 13.0 - 17.0 g/dL   HCT 44.9 39 - 52 %   MCV 80.0 80.0 - 100.0 fL   MCH 24.4 (L) 26.0 - 34.0 pg   MCHC 30.5 30.0 - 36.0 g/dL   RDW 25.6 (H) 11.5 - 15.5 %   Platelets 215 150 - 400 K/uL   nRBC 0.0 0.0 - 0.2 %   Neutrophils Relative % 74 %   Neutro Abs 7.2 1.7 - 7.7 K/uL   Lymphocytes Relative 12 %   Lymphs Abs 1.2 0.7 - 4.0 K/uL   Monocytes Relative 9 %   Monocytes Absolute 0.9 0 - 1 K/uL   Eosinophils Relative 4 %   Eosinophils Absolute 0.4 0 - 0 K/uL   Basophils Relative 1 %   Basophils Absolute 0.1 0 - 0 K/uL   Immature Granulocytes 0 %   Abs Immature Granulocytes 0.04 0.00 - 0.07 K/uL    Comment: Performed at [facility], [address] [place], [place] [address]  CBC with Differential/Platelet     Status: Abnormal   Collection Time: 10/25/19 10:56 AM  Result Value Ref Range   WBC 10.5 4.0 - 10.5 K/uL   RBC 5.53 4.22 - 5.81 MIL/uL   Hemoglobin 13.7 13.0 - 17.0 g/dL   HCT 44.5 39 - 52 %   MCV 80.5 80.0 - 100.0 fL   MCH 24.8 (L) 26.0 - 34.0 pg   MCHC 30.8 30.0 - 36.0 g/dL   RDW 24.6 (H) 11.5 - 15.5 %   Platelets 223 150 - 400 K/uL   nRBC 0.0 0.0 - 0.2 %   Neutrophils Relative % 78 %   Neutro Abs 8.1 (H) 1.7 - 7.7 K/uL   Lymphocytes Relative 11 %   Lymphs Abs 1.1 0.7 - 4.0 K/uL   Monocytes Relative 8 %   Monocytes Absolute 0.9 0 - 1 K/uL   Eosinophils Relative 2 %   Eosinophils Absolute 0.3 0 - 0 K/uL   Basophils Relative 0 %   Basophils Absolute 0.0 0 - 0 K/uL   Immature Granulocytes 1 %   Abs Immature  Granulocytes 0.06 0.00 - 0.07 K/uL    Comment: Performed at [facility], [address] [place], [place] [address]  Comprehensive metabolic panel     Status: Abnormal   Collection Time: 11/12/19 11:28 AM  Result Value Ref Range   Sodium 137 135 - 145 mmol/L   Potassium 5.0 3.5 - 5.1 mmol/L   Chloride 106 98 - 111 mmol/L   CO2 23 22 - 32 mmol/L   Glucose, Bld 94 70 - 99 mg/dL    Comment: Glucose reference range applies only to samples taken after fasting for at least 8 hours.   BUN 23 8 - 23 mg/dL   Creatinine, Ser 1.90 (H) 0.61 - 1.24 mg/dL   Calcium 8.6 (L) 8.9 - 10.3 mg/dL   Total Protein 8.0 6.5 - 8.1 g/dL   Albumin 3.7 3.5 - 5.0 g/dL   AST 21 15 - 41 U/L   ALT 22 0 - 44 U/L  Alkaline Phosphatase 69 38 - 126 U/L   Total Bilirubin 0.5 0.3 - 1.2 mg/dL   GFR calc non Af Amer 33 (L) >60 mL/min   GFR calc Af Amer 39 (L) >60 mL/min   Anion gap 8 5 - 15    Comment: Performed at [facility], [address] [place], [place] [address]  Kappa/lambda light chains     Status: Abnormal   Collection Time: 11/12/19 11:28 AM  Result Value Ref Range   Kappa free light chain 143.7 (H) 3.3 - 19.4 mg/L   Lamda free light chains 37.9 (H) 5.7 - 26.3 mg/L   Kappa, lamda light chain ratio 3.79 (H) 0.26 - 1.65    Comment: (NOTE) Performed At: [facility] [place], [place] [id number] Rush Farmer MD ZP:[id number]   Multiple Myeloma Panel (SPEP&IFE w/QIG)     Status: Abnormal   Collection Time: 11/12/19 11:28 AM  Result Value Ref Range   IgG (Immunoglobin G), Serum 2,016 (H) 603 - 1,613 mg/dL   IgA 243 61 - 437 mg/dL   IgM (Immunoglobulin M), Srm 93 15 - 143 mg/dL   Total Protein ELP 7.3 6.0 - 8.5 g/dL   Albumin SerPl Elph-Mcnc 3.5 2.9 - 4.4 g/dL   Alpha 1 0.2 0.0 - 0.4 g/dL   Alpha2 Glob SerPl Elph-Mcnc 0.8 0.4 - 1.0 g/dL   B-Globulin SerPl Elph-Mcnc 1.0 0.7 - 1.3 g/dL   Gamma Glob SerPl Elph-Mcnc 1.8 0.4 - 1.8 g/dL   M Protein SerPl Elph-Mcnc Not  Observed Not Observed g/dL   Globulin, Total 3.8 2.2 - 3.9 g/dL   Albumin/Glob SerPl 1.0 0.7 - 1.7   IFE 1 Comment (A)     Comment: Polyclonal increase detected in one or more immunoglobulins.   Please Note Comment     Comment: (NOTE) Protein electrophoresis scan will follow via computer, mail, or courier delivery. Performed At: [facility] [place], [place] [id number] Rush Farmer MD VO:[id number]   Ferritin     Status: Abnormal   Collection Time: 11/12/19 11:28 AM  Result Value Ref Range   Ferritin 22 (L) 24 - 336 ng/mL    Comment: Performed at [facility], [address] [place], [place] [address]  Iron and TIBC     Status: Abnormal   Collection Time: 11/12/19 11:28 AM  Result Value Ref Range   Iron 25 (L) 45 - 182 ug/dL   TIBC 388 250 - 450 ug/dL   Saturation Ratios 6 (L) 17.9 - 39.5 %   UIBC 363 ug/dL    Comment: Performed at [facility], [address] [place], [place] [address]  CBC with Differential/Platelet     Status: Abnormal   Collection Time: 11/12/19 11:28 AM  Result Value Ref Range   WBC 8.9 4.0 - 10.5 K/uL   RBC 5.76 4.22 - 5.81 MIL/uL   Hemoglobin 14.4 13.0 - 17.0 g/dL   HCT 46.0 39 - 52 %   MCV 79.9 (L) 80.0 - 100.0 fL   MCH 25.0 (L) 26.0 - 34.0 pg   MCHC 31.3 30.0 - 36.0 g/dL   RDW 22.6 (H) 11.5 - 15.5 %   Platelets 233 150 - 400 K/uL   nRBC 0.0 0.0 - 0.2 %   Neutrophils Relative % 75 %   Neutro Abs 6.7 1.7 - 7.7 K/uL   Lymphocytes Relative 12 %   Lymphs Abs 1.0 0.7 - 4.0 K/uL   Monocytes Relative 9 %  Monocytes Absolute 0.8 0 - 1 K/uL   Eosinophils Relative 3 %   Eosinophils Absolute 0.3 0 - 0 K/uL   Basophils Relative 0 %   Basophils Absolute 0.0 0 - 0 K/uL   Immature Granulocytes 1 %   Abs Immature Granulocytes 0.05 0.00 - 0.07 K/uL    Comment: Performed at [facility], [address], [place], [place] [address]    Radiology CT CHEST LUNG CANCER SCREENING LOW DOSE WO  CONTRAST  Result Date: 12/17/2019 CLINICAL DATA:  Current smoker, 61 pack-year history. EXAM: CT CHEST WITHOUT CONTRAST LOW-DOSE FOR LUNG CANCER SCREENING TECHNIQUE: Multidetector CT imaging of the chest was performed following the standard protocol without IV contrast. COMPARISON:  11/09/2018. FINDINGS: Cardiovascular: Atherosclerotic calcification of the aorta, aortic valve and coronary arteries. Heart is at the upper limits of normal in size. No pericardial effusion. Mediastinum/Nodes: No pathologically enlarged mediastinal or axillary lymph nodes. Hilar regions are difficult to definitively evaluate without IV contrast but appear grossly unremarkable. Esophagus is grossly unremarkable. Lungs/Pleura: Biapical pleuroparenchymal scarring. Centrilobular and paraseptal emphysema. Pulmonary nodules measure 3.2 mm or less in size, as before. Mild basilar predominant peripheral ground-glass and subpleural reticular densities are similar to minimally progressive from 11/09/2018. No pleural fluid. Airway is unremarkable. Upper Abdomen: Low-attenuation lesion in the left hepatic lobe measures 1.5 cm, unchanged and likely a cyst. Visualized portions of the liver, gallbladder and adrenal glands are unremarkable. Low-attenuation lesions in the kidneys measure up to 5.0 cm on the left and are likely cysts although definitive characterization is limited without IV contrast and/or due to incomplete visualization. Visualized portions of the spleen, pancreas, stomach and bowel are grossly unremarkable. No upper abdominal adenopathy. Musculoskeletal: Degenerative changes in the spine. No worrisome lytic or sclerotic lesions. IMPRESSION: 1. Lung-RADS 2, benign appearance or behavior. Continue annual screening with low-dose chest CT without contrast in 12 months. 2. Basilar predominant peripheral ground-glass and subpleural reticular densities, similar to minimally progressive from 11/09/2018. Findings can be seen with interstitial  lung disease. If further evaluation is desired, high-resolution chest CT without contrast could be performed. 3. Aortic atherosclerosis (ICD10-I70.0). Coronary artery calcification. 4.  Emphysema (ICD10-J43.9). Electronically Signed   By: Lorin Picket M.D.   On: 12/17/2019 13:23    Assessment/Plan BP (high blood pressure) blood pressure control important in reducing the progression of atherosclerotic disease and aneurysmal growth. On appropriate oral medications.   HLD (hyperlipidemia) lipid control important in reducing the progression of atherosclerotic disease. Continue statin therapy   Tobacco use disorder [place] Nails discussed this many times and he understands it is deleterious to his vascular system but he really has no interest in quitting.  AAA (abdominal aortic aneurysm) without rupture (HCC) Duplex today shows slight decrease in the aneurysm sac size down to 4.0 cm in maximal diameter.  There is no endoleak present and the stent graft is widely patent.  No problems over 5 years status post repair.  Continue to follow annually with duplex.    Leotis Pain, MD  01/01/2020 9:41 AM    This note was created with Dragon medical transcription system.  Any errors from dictation are purely unintentional

## 2020-01-01 NOTE — Assessment & Plan Note (Signed)
Duplex today shows slight decrease in the aneurysm sac size down to 4.0 cm in maximal diameter.  There is no endoleak present and the stent graft is widely patent.  No problems over 5 years status post repair.  Continue to follow annually with duplex.

## 2020-01-14 ENCOUNTER — Other Ambulatory Visit: Payer: Self-pay

## 2020-01-14 ENCOUNTER — Inpatient Hospital Stay: Payer: Medicare PPO | Attending: Oncology

## 2020-01-14 DIAGNOSIS — Z7901 Long term (current) use of anticoagulants: Secondary | ICD-10-CM | POA: Diagnosis not present

## 2020-01-14 DIAGNOSIS — N183 Chronic kidney disease, stage 3 unspecified: Secondary | ICD-10-CM | POA: Diagnosis present

## 2020-01-14 DIAGNOSIS — I129 Hypertensive chronic kidney disease with stage 1 through stage 4 chronic kidney disease, or unspecified chronic kidney disease: Secondary | ICD-10-CM | POA: Insufficient documentation

## 2020-01-14 DIAGNOSIS — D631 Anemia in chronic kidney disease: Secondary | ICD-10-CM | POA: Diagnosis present

## 2020-01-14 DIAGNOSIS — I9589 Other hypotension: Secondary | ICD-10-CM

## 2020-01-14 DIAGNOSIS — Z8546 Personal history of malignant neoplasm of prostate: Secondary | ICD-10-CM | POA: Diagnosis not present

## 2020-01-14 DIAGNOSIS — D500 Iron deficiency anemia secondary to blood loss (chronic): Secondary | ICD-10-CM | POA: Insufficient documentation

## 2020-01-14 DIAGNOSIS — M069 Rheumatoid arthritis, unspecified: Secondary | ICD-10-CM | POA: Diagnosis not present

## 2020-01-14 DIAGNOSIS — E861 Hypovolemia: Secondary | ICD-10-CM

## 2020-01-14 DIAGNOSIS — I951 Orthostatic hypotension: Secondary | ICD-10-CM | POA: Insufficient documentation

## 2020-01-14 DIAGNOSIS — C9001 Multiple myeloma in remission: Secondary | ICD-10-CM

## 2020-01-14 DIAGNOSIS — F17210 Nicotine dependence, cigarettes, uncomplicated: Secondary | ICD-10-CM | POA: Diagnosis not present

## 2020-01-14 DIAGNOSIS — R8290 Unspecified abnormal findings in urine: Secondary | ICD-10-CM | POA: Diagnosis not present

## 2020-01-14 DIAGNOSIS — Z79899 Other long term (current) drug therapy: Secondary | ICD-10-CM | POA: Diagnosis not present

## 2020-01-14 LAB — IRON AND TIBC
Iron: 33 ug/dL — ABNORMAL LOW (ref 45–182)
Saturation Ratios: 9 % — ABNORMAL LOW (ref 17.9–39.5)
TIBC: 360 ug/dL (ref 250–450)
UIBC: 327 ug/dL

## 2020-01-14 LAB — CBC WITH DIFFERENTIAL/PLATELET
Abs Immature Granulocytes: 0.05 10*3/uL (ref 0.00–0.07)
Basophils Absolute: 0 10*3/uL (ref 0.0–0.1)
Basophils Relative: 0 %
Eosinophils Absolute: 0.3 10*3/uL (ref 0.0–0.5)
Eosinophils Relative: 3 %
HCT: 47.8 % (ref 39.0–52.0)
Hemoglobin: 15.4 g/dL (ref 13.0–17.0)
Immature Granulocytes: 1 %
Lymphocytes Relative: 12 %
Lymphs Abs: 1.1 10*3/uL (ref 0.7–4.0)
MCH: 25.8 pg — ABNORMAL LOW (ref 26.0–34.0)
MCHC: 32.2 g/dL (ref 30.0–36.0)
MCV: 80.1 fL (ref 80.0–100.0)
Monocytes Absolute: 0.7 10*3/uL (ref 0.1–1.0)
Monocytes Relative: 7 %
Neutro Abs: 7.1 10*3/uL (ref 1.7–7.7)
Neutrophils Relative %: 77 %
Platelets: 234 10*3/uL (ref 150–400)
RBC: 5.97 MIL/uL — ABNORMAL HIGH (ref 4.22–5.81)
RDW: 21.6 % — ABNORMAL HIGH (ref 11.5–15.5)
WBC: 9.1 10*3/uL (ref 4.0–10.5)
nRBC: 0 % (ref 0.0–0.2)

## 2020-01-14 LAB — FERRITIN: Ferritin: 20 ng/mL — ABNORMAL LOW (ref 24–336)

## 2020-01-15 LAB — MISC LABCORP TEST (SEND OUT): Labcorp test code: 143000

## 2020-01-16 ENCOUNTER — Inpatient Hospital Stay: Payer: Medicare PPO

## 2020-01-16 ENCOUNTER — Inpatient Hospital Stay (HOSPITAL_BASED_OUTPATIENT_CLINIC_OR_DEPARTMENT_OTHER): Payer: Medicare PPO | Admitting: Oncology

## 2020-01-16 ENCOUNTER — Encounter: Payer: Self-pay | Admitting: Oncology

## 2020-01-16 ENCOUNTER — Other Ambulatory Visit: Payer: Self-pay

## 2020-01-16 VITALS — BP 90/60 | HR 63 | Temp 96.4°F

## 2020-01-16 VITALS — BP 85/63 | HR 103 | Temp 96.1°F | Resp 18 | Wt 189.9 lb

## 2020-01-16 DIAGNOSIS — Q2730 Arteriovenous malformation, site unspecified: Secondary | ICD-10-CM | POA: Diagnosis not present

## 2020-01-16 DIAGNOSIS — C9001 Multiple myeloma in remission: Secondary | ICD-10-CM | POA: Diagnosis not present

## 2020-01-16 DIAGNOSIS — I951 Orthostatic hypotension: Secondary | ICD-10-CM

## 2020-01-16 DIAGNOSIS — D509 Iron deficiency anemia, unspecified: Secondary | ICD-10-CM

## 2020-01-16 DIAGNOSIS — Z8546 Personal history of malignant neoplasm of prostate: Secondary | ICD-10-CM | POA: Diagnosis not present

## 2020-01-16 DIAGNOSIS — D500 Iron deficiency anemia secondary to blood loss (chronic): Secondary | ICD-10-CM | POA: Diagnosis not present

## 2020-01-16 DIAGNOSIS — I129 Hypertensive chronic kidney disease with stage 1 through stage 4 chronic kidney disease, or unspecified chronic kidney disease: Secondary | ICD-10-CM | POA: Diagnosis not present

## 2020-01-16 MED ORDER — IRON SUCROSE 20 MG/ML IV SOLN
200.0000 mg | Freq: Once | INTRAVENOUS | Status: AC
  Administered 2020-01-16: 200 mg via INTRAVENOUS
  Filled 2020-01-16: qty 10

## 2020-01-16 MED ORDER — SODIUM CHLORIDE 0.9 % IV SOLN
Freq: Once | INTRAVENOUS | Status: AC
  Filled 2020-01-16: qty 250

## 2020-01-16 NOTE — Progress Notes (Signed)
Pt here for follow up. Pt reports new nodules on hands and feet that has been looked at by Dr. Jefm Bryant. BP low today, pt reports slight lightheadedness. Orthostatics performed.

## 2020-01-16 NOTE — Progress Notes (Signed)
1350: Per Benjamine Mola RN per Dr. Tasia Catchings proceed with Venofer and pt to also receive 500cc NS over 30 minutes.  1441: B/P 90/60, pt denies symptoms, no s/s of distress noted. MD aware 1500: Per Dr. Tasia Catchings okay to discharge pt home. Pt to contact PCP and to report to ER if symptoms develop, pt verbalizes understanding  No s/s of distress or reaction noted, pt stable at discharge.

## 2020-01-16 NOTE — Progress Notes (Signed)
Hematology/Oncology Follow Up Note [facility] [phone] [phone]) [phone]  Patient Care Team: Jodi Marble, MD as PCP - General (Internal Medicine) Bary Castilla, Forest Gleason, MD (General Surgery) Edrick Kins, MD as Rounding Team (Internal Medicine) Hillary Bow, MD (Inactive) as Consulting Physician (Internal Medicine) [name], MD as Surgeon (Gastroenterology) Isaias Cowman, MD as Consulting Physician (Cardiology) Earlie Server, MD as Consulting Physician (Oncology)  REFERRING PROVIDER: Dr.Lateef, [place]  REASON FOR VISIT Follow up for management of plasma cell dyscrasia.  And iron deficiency anemia.   HISTORY OF PRESENTING ILLNESS:  79 y.o.  male with PMH listed below who presents to follow up on the evaluation and management of his abnormal urine protein electrophoresis results. I reviewed the records from [facility] [place] [place], [place] and [place] was performed and HemOnc related medical problems are listed below.  He lives with a friend. He has 3 adult children.   1 Chronic Kidney disease, Stage III: patient follows up with Dr.Lateef.  2 Proteinuria:  02/18/2017: Albumin/Creatinine ratio was 7, Urine protein electrophoresis,Random Urine revealed M Spike 43.2%, total protein of 43.39m/dl,  01/05/2017 Albumin/Creatinine ratio was 455.2, Albumin 996.5, , 2.15.2018 Albumin/Creatinine ratio was 80.3, Albumin 73.3, Urine protein electrophoresis,Random Urine revealed M Spike 29.7%, total protein of 29.124mdl, 06/30/2016 Serum protein electrophoresis did not detect M spike.  Autoimmune disorder work up showed Negative anti dsDNA, RNP antibodies, smith antibody, Sjogren antibodies, anti Jo-1, positive antichromotin antibodies, positive ANA,  3 Anemia of chronic kidney disease:  4 Iron deficiency anemia: history of iron deficiency, ferritin was 7 on 08/11/2016. He had EGD and colonoscopy that were done  this year which showed duodenitis/esphagitis/diverticulosis/benigh polyps. # [facility]  5 Rheumatoid arthritis: he follows up with Dr.Kernodle and is on chronic MTX. He Is currently off MTX reports joint pains are controlled, not quite symptomatic.   Patient denies any persistent bone pain or any pain. He continue to feel lack of energy, persistent, not improved with resting or taking naps. He also has lost 25 pounds in the past year which was unintentional.   # Rheumatoid arthritis and currently is off MTX.  # It is ambiguous whether he has truly symptomatic multiple myeloma or a smoldering myeloma, given that anemia can be secondary to iron deficiency and CKD.  The hypermetabolic 2.1 cm hypermetabolic soft tissue lesion in the left heel, can reflect a Plasmacytoma,which should have been biopsied. However patient initially denies being on any blood thinner, later he was found to be on Plavix with pre biopsy questionnaire screening, and biopsy was held. Patient has to contact cardiologist to hold plavix for seven days before procedure. He feels frustrated about multiple workup and meanwhile he becomes more fatigues, with worsening of kidney function. Patient was reluctant about proceeding additional invasive procedures and wants to start treatment. Decision was made to start active MM treatment.   # [facility]. He is not considered to  good candidate for transplant. Patient cardiologist Dr. KaChancy Milroyad started patient on Lasix 20 mg daily. [facility] as patient was having dizziness and borderline blood pressure during his clinic visit there.  10/02/2018 He had follow-up left foot/ankle x-ray as a follow-up of his left heel lesion which was initially presented on PET scan and not detectable on follow-up PET scan.  Images were reviewed by me.  No  suspicious acute or subacute osseous abnormality  Current Treatment:  S/p RVD x 4 cycles tolerates well except cytopenia.  Revlimid 10 mg PO once per day on days 1 to 14 (dose reduced due to GFR) Revlimid renal dosing of 32m,  Velcade 1.3 mg/m2 IV once per day on days 1,  8, 15 Dexamethasone 20 mg PO once per day on days 1, 8, 15 given patient's diabetes Given patient's multiple comorbidity and the side effects of cytopenia during his RVD treatment and the uncertainty of this is really a site of plasmacytoma or not, I recommend not to continue him on maintenance Revlimid.   # repeat small bowel capsule study on 02/16/2019 Which showed multiple AVMs seen in the mid jejunum proximal to the site of prior tattoo. # [facility]19[facility]2019 double-balloon enteroscopy with findings of 5 AVMs in the jejunum and 2 AVMs in the duodenum and AVMs were ablated with APC.  Tattoo was seen in the area. If further bleeding, recommend to consider treatment with octreotide. Patient takes Protonix daily.    INTERVAL HISTORY Patient presents for follow-up of management of multiple myeloma/smoldering multiple myeloma and iron deficiency anemia.Patient is not currently on any maintenance treatments. Patient reports feeling well. He was off Eliquis 2.5 mg twice daily. restarted per cardiology's recommendation. Patient has received IV Venofer treatments. Denies hematochezia, hematuria, hematemesis, epistaxis, black tarry stool or easy bruising.  Blood pressure has been low.  No lightheaded or dizziness.  Review of Systems  Constitutional: Negative for appetite change, chills, fatigue, fever and unexpected weight change.  HENT:   Negative for hearing loss and voice change.   Eyes: Negative for eye problems and icterus.  Respiratory: Negative for chest tightness, cough and shortness of breath.   Cardiovascular: Negative for chest pain and leg swelling.  Gastrointestinal:  Negative for abdominal distention, abdominal pain and diarrhea.  Endocrine: Negative for hot flashes.  Genitourinary: Negative for difficulty urinating, dysuria and frequency.   Musculoskeletal: Positive for arthralgias.       Nodules of hand and feet joints  Skin: Negative for itching and rash.  Neurological: Negative for light-headedness and numbness.  Hematological: Negative for adenopathy. Does not bruise/bleed easily.  Psychiatric/Behavioral: Negative for confusion.     MEDICAL HISTORY:  Past Medical History:  Diagnosis Date  . Anxiety   . Arthritis    rheumatoid  . Atrial fibrillation (HCC)    hx of  . Cancer ([place]   . Chronic kidney disease   . Colon polyp 07-07-15   TUBULAR ADENOMA WITH AT LEAST HIGH-GRADE / Dr RRayann Heman . Diabetes mellitus without complication ([place]   . Dysrhythmia    bradycardia....Marland KitchenMarland Kitchen to 1 heart block  . GERD (gastroesophageal reflux disease)   . Heart murmur    patient unaware of history of murmur  . Hypercholesterolemia   . Hypertension   . Presence of permanent cardiac pacemaker   . [place]state cancer ([place] 01/01/13, 01/30/14   Gleason 3+4=7, volume 46.6 cc  . Rheumatoid arthritis ([place]   . S/P radiation therapy  04/03/2014 through 06/04/2014                                                      Prostate 7800 cGy in 40 sessions                          .  Sleep apnea    uses cpap but it is not in the hospital with him    SURGICAL HISTORY: Past Surgical History:  Procedure Laterality Date  . ABDOMINAL AORTIC ANEURYSM REPAIR  11/2013  . COLON SURGERY  March 2017   Right hemicolectomy for tubulovillous adenoma with high-grade dysplasia.  . COLONOSCOPY WITH PROPOFOL N/A 07/07/2015   Procedure: COLONOSCOPY WITH PROPOFOL;  Surgeon: Josefine Class, MD;  Location: [facility] ENDOSCOPY;  Service: Endoscopy;  Laterality: N/A;  . COLONOSCOPY WITH PROPOFOL N/A 10/28/2016   Procedure: COLONOSCOPY WITH PROPOFOL;  Surgeon: [name], MD;  Location[facility] ENDOSCOPY;   Service: Endoscopy;  Laterality: N/A;  . ESOPHAGOGASTRODUODENOSCOPY (EGD) WITH PROPOFOL N/A 10/28/2016   Procedure: ESOPHAGOGASTRODUODENOSCOPY (EGD) WITH PROPOFOL;  Surgeon: [name], MD;  Location: [facility] ENDOSCOPY;  Service: Endoscopy;  Laterality: N/A;  . GIVENS CAPSULE STUDY N/A 07/25/2017   Procedure: GIVENS CAPSULE STUDY;  Surgeon: [name], MD;  Location: [facility] ENDOSCOPY;  Service: Gastroenterology;  Laterality: N/A;  . GIVENS CAPSULE STUDY N/A 01/29/2019   Procedure: GIVENS CAPSULE STUDY;  Surgeon: [name], MD;  Location: [facility] ENDOSCOPY;  Service: Gastroenterology;  Laterality: N/A;  . LAPAROSCOPIC RIGHT COLECTOMY Right 08/08/2015   Procedure: LAPAROSCOPIC RIGHT COLECTOMY;  Surgeon: Robert Bellow, MD;  Location: [facility] ORS;  Service: General;  Laterality: Right;  . NASAL SINUS SURGERY    . PACEMAKER INSERTION Left 08/26/2016   Procedure: INSERTION PACEMAKER;  Surgeon: Isaias Cowman, MD;  Location: [facility] ORS;  Service: Cardiovascular;  Laterality: Left;  . PROSTATE BIOPSY  01/01/13, 01/30/14   Gleason 3+3=6, vol 46.6 cc  . TONSILLECTOMY    . uvula surgery     for sleep apnea    SOCIAL HISTORY: Social History   Socioeconomic History  . Marital status: Divorced    Spouse name: Not on file  . Number of children: Not on file  . Years of education: Not on file  . Highest education level: Not on file  Occupational History  . Not on file  Tobacco Use  . Smoking status: Current Every Day Smoker    Packs/day: 1.00    Years: 60.00    Pack years: 60.00    Types: Cigarettes  . Smokeless tobacco: Never Used  . Tobacco comment: DOWN TO 1/2 PPD  Vaping Use  . Vaping Use: Never used  Substance and Sexual Activity  . Alcohol use: Yes    Alcohol/week: 1.0 standard drink    Types: 1 Cans of beer per week    Comment: occasionally  . Drug use: No  . Sexual activity: Not Currently  Other Topics Concern  . Not on file  Social History Narrative  . Not on file   Social  Determinants of Health   Financial Resource Strain:   . Difficulty of Paying Living Expenses:   Food Insecurity:   . Worried About Charity fundraiser in the Last Year:   . Arboriculturist in the Last Year:   Transportation Needs:   . Film/video editor (Medical):   Marland Kitchen Lack of Transportation (Non-Medical):   Physical Activity:   . Days of Exercise per Week:   . Minutes of Exercise per Session:   Stress:   . Feeling of Stress :   Social Connections:   . Frequency of Communication with Friends and Family:   . Frequency of Social Gatherings with Friends and Family:   . Attends Religious Services:   . Active Member of Clubs or Organizations:   .  Attends Archivist Meetings:   Marland Kitchen Marital Status:   Intimate Partner Violence:   . Fear of Current or Ex-Partner:   . Emotionally Abused:   Marland Kitchen Physically Abused:   . Sexually Abused:     FAMILY HISTORY: Family History  Problem Relation Age of Onset  . Heart attack Mother   . Cirrhosis Father   . Pancreatic cancer Brother   . Diabetes Brother   . Diabetes Daughter        medication induced for cancer treatments  . Cancer Daughter        breast, brain    ALLERGIES:  has No Known Allergies.  MEDICATIONS:  Current Outpatient Medications  Medication Sig Dispense Refill  . acetaminophen (TYLENOL) 325 MG tablet Take 325 mg by mouth every 6 (six) hours as needed.    Marland Kitchen albuterol (PROVENTIL HFA;VENTOLIN HFA) 108 (90 Base) MCG/ACT inhaler     . ALPRAZolam (XANAX) 0.25 MG tablet Take by mouth 2 (two) times daily as needed.     Marland Kitchen amLODipine-benazepril (LOTREL) 5-20 MG capsule Take 1 capsule by mouth daily.    Marland Kitchen apixaban (ELIQUIS) 2.5 MG TABS tablet Take by mouth.    Marland Kitchen atorvastatin (LIPITOR) 80 MG tablet Take by mouth.    . benazepril (LOTENSIN) 20 MG tablet Take 20 mg by mouth daily.    . Butalbital-APAP-Caffeine (FIORICET) 50-300-40 MG CAPS Take 1 capsule every four to six hours as needed    . Cholecalciferol 1.25 MG (50000  UT) capsule Take by mouth. (Patient not taking: Reported on 01/01/2020)    . clopidogrel (PLAVIX) 75 MG tablet Take by mouth. (Patient not taking: Reported on 01/01/2020)    . colchicine 0.6 MG tablet Take by mouth.    . ezetimibe (ZETIA) 10 MG tablet Take 1 tablet by mouth 1 day or 1 dose.    . fexofenadine (ALLEGRA) 180 MG tablet Take by mouth.    . fluticasone (FLONASE) 50 MCG/ACT nasal spray Place 2 sprays into both nostrils daily as needed for rhinitis.     . Fluticasone Furoate-Vilanterol (BREO ELLIPTA) 100-25 MCG/INH AEPB Inhale 1 puff into the lungs daily.     . furosemide (LASIX) 20 MG tablet Take 20 mg by mouth daily.    Marland Kitchen gabapentin (NEURONTIN) 400 MG capsule Take by mouth.    Marland Kitchen glucose blood (ONETOUCH ULTRA) test strip     . hydroxychloroquine (PLAQUENIL) 200 MG tablet Take by mouth.    . isosorbide mononitrate (IMDUR) 30 MG 24 hr tablet Take 30 mg by mouth daily.    Marland Kitchen JARDIANCE 25 MG TABS tablet     . loperamide (IMODIUM) 2 MG capsule Take 1 capsule (2 mg total) by mouth See admin instructions. With onset of diarrhea, take 53m followed by 251mevery 2 hours resolved. Maximum: 16 mg/day (Patient not taking: Reported on 01/01/2020) 120 capsule 0  . metoprolol succinate (TOPROL-XL) 25 MG 24 hr tablet Take 25 mg by mouth daily.    . ondansetron (ZOFRAN) 8 MG tablet Take 1 tablet (8 mg total) 2 (two) times daily as needed by mouth (Nausea or vomiting). (Patient not taking: Reported on 01/01/2020) 30 tablet 1  . pantoprazole (PROTONIX) 20 MG tablet Take 20 mg by mouth daily.    . tamsulosin (FLOMAX) 0.4 MG CAPS capsule Take 1 capsule (0.4 mg total) by mouth daily after supper. 90 capsule 3  . tiZANidine (ZANAFLEX) 2 MG tablet Take 2 mg by mouth 3 (three) times daily.    .Marland Kitchen  traMADol-acetaminophen (ULTRACET) 37.5-325 MG tablet     . triamcinolone cream (KENALOG) 0.1 % Apply 1 application topically 2 (two) times daily.    Marland Kitchen UNABLE TO FIND SMARTSIG:1 Capsule(s) By Mouth Once a Week     No current  facility-administered medications for this visit.      Marland Kitchen  PHYSICAL EXAMINATION: ECOG PERFORMANCE STATUS: 1 - Symptomatic but completely ambulatory Vitals:   01/16/20 1321 01/16/20 1325  BP: (!) 87/58 (!) 85/63  Pulse: 89 (!) 103  Resp:    Temp:     Filed Weights   01/16/20 1314  Weight: 189 lb 14.4 oz (86.1 kg)   Physical Exam Constitutional:      General: He is not in acute distress.    Appearance: He is not diaphoretic.  HENT:     Head: Normocephalic and atraumatic.     Nose: Nose normal.     Mouth/Throat:     Pharynx: No oropharyngeal exudate.  Eyes:     General: No scleral icterus.       Right eye: No discharge.        Left eye: No discharge.     Pupils: Pupils are equal, round, and reactive to light.  Neck:     Vascular: No JVD.  Cardiovascular:     Rate and Rhythm: Normal rate and regular rhythm.     Heart sounds: Normal heart sounds. No murmur heard.  No friction rub.  Pulmonary:     Effort: Pulmonary effort is normal. No respiratory distress.     Breath sounds: Normal breath sounds. No wheezing or rales.  Chest:     Chest wall: No tenderness.  Abdominal:     General: Bowel sounds are normal. There is no distension.     Palpations: Abdomen is soft. There is no mass.     Tenderness: There is no abdominal tenderness.  Musculoskeletal:        General: No tenderness. Normal range of motion.     Cervical back: Normal range of motion and neck supple.  Lymphadenopathy:     Cervical: No cervical adenopathy.  Skin:    General: Skin is warm and dry.     Findings: No erythema.     Comments: Hypopigmentation of skin  Neurological:     Mental Status: He is alert and oriented to person, place, and time.  Psychiatric:        Mood and Affect: Affect normal.     LABORATORY DATA:  I have reviewed the data as listed Lab Results  Component Value Date   WBC 9.1 01/14/2020   HGB 15.4 01/14/2020   HCT 47.8 01/14/2020   MCV 80.1 01/14/2020   PLT 234 01/14/2020     Recent Labs    09/17/19 1057 11/12/19 1128  NA 138 137  K 4.6 5.0  CL 108 106  CO2 24 23  GLUCOSE 93 94  BUN 21 23  CREATININE 1.51* 1.90*  CALCIUM 8.6* 8.6*  GFRNONAA 44* 33*  GFRAA 51* 39*  PROT 7.5 8.0  ALBUMIN 3.4* 3.7  AST 15 21  ALT 14 22  ALKPHOS 55 69  BILITOT 0.5 0.5    SPEP: no M spike Serum free light chain Kappa/Lamda ratio 13.04 UPEP  Free light chain Kappa/lamda ratio 162.43, M spike '198mg'$ /24 hour   Bone marrow biopsy 03/21/2017  Bone Marrow, Aspirate,Biopsy, and Clot, left iliac - HYPERCELLULAR BONE MARROW FOR AGE WITH TRILINEAGE HEMATOPOIESIS. - PLASMACYTOSIS (PLASMA CELLS 8%)  Karyotype: loss of Y chromosome  Cytogenetic MDS FISH Panel negative.   Bone marrow 04/06/2017  Bone Marrow, Aspirate,Biopsy, and Clot, right iliac and core - HYPERCELLULAR BONE MARROW FOR AGE WITH PLASMA CELL NEOPLASM - TRILINEAGE HEMATOPOIESIS. - SEE COMMENT. PERIPHERAL BLOOD: - MICROCYTIC-HYPOCHROMIC ANEMIA. Diagnosis Note The bone marrow is hypercellular with trilineage hematopoiesis but with relative abundance of erythroid precursors and increased number of megakaryocytes with nonspecific changes. Significant dyspoiesis is not seen. Iron stores are present with no ring sideroblasts. The plasma cells are increased in number representing 8% of all cells in the aspirate although focal areas in the core biopsy show 10 to 20% as primarily seen by CD138 stain. In situ hybridization for kappa and lambda light chains show kappa light chain restriction consistent with plasma cell neoplasm. Correlation with cytogenetic and FISH studies is recommended. (BNS:ecj/gt 04/07/2017)  IMAGE STUDIES I have personally reviewed below image results.  03/15/2017 DG bone survey Met: Nonspecific lucent lesion in the distal right ulna measuring 15-16 mm, but otherwise normal bone mineralization for age throughout the visible skeleton. A solitary lytic lesion in a distal extremity would be an  unusual presentation of multiple myeloma, and I favor the distal right ulna lesion is benign. Recommend correlation with serum and urine protein electrophoresis.  04/14/2017 PET scan: 1. 2.1 cm hypermetabolic soft tissue lesion in the left heel, adjacent to the calcaneal tuberosity. 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 2. Mottled FDG accumulation diffusely in the marrow space without other frankly overt hypermetabolic bony lesion. 3. Coronary artery and thoracoabdominal aortic atherosclerosis with abdominal aortic stent graft visualized in situ. 4. Bilateral renal cysts and bilateral nonobstructing renal stones.  08/24/2017 PET scan 1. No hypermetabolic osseous lesions. No definite signs of multiple myeloma on CT imaging. 2. Interval improvement in soft tissue activity along the medial aspect of the left calcaneal tuberosity, likely plantar fasciitis. 3. New small left pleural effusion. 4. Bilateral inguinal hernias containing small bowel on the right and sigmoid colon on the left. No evidence of incarceration or obstruction. 5. Otherwise stable incidental findings including diffuse atherosclerosis, bilateral renal cysts, emphysema and sigmoid diverticulosis.  ASSESSMENT & PLAN:  1. Iron deficiency anemia due to chronic blood loss   2. Multiple myeloma in remission ([place])   3. AVM (arteriovenous malformation)   4. History of prostate cancer   5. Orthostatic hypotension    #Iron deficiency anemia secondary to chronic blood loss due to multiple AVM in small bowels. Status post enteroscopy at [facility] for AVM ablation. Labs reviewed and discussed with patient. Hemoglobin has completely normalized. Iron panel still consistent with underlying iron deficiency.  Patient will proceed with IV Venofer 200 mg x 1 today.   #Hypotension, orthostatic.   Unknown etiology.  Patient also does not know what blood pressure medications he has been taking. Advised patient to hold blood pressure medication,  contact PCP immediately.  Also advised patient to go to emergency room if he feels lightheaded or dizziness.  Patient will receive IV fluid 500 cc today.  #Smoldering multiple myeloma/active myeloma if previous hypermetabolic lesion on PET scan was related to myeloma. Patient was treated with 4 cycles of RVD.  He is not currently on any maintenance therapy due to multiple comorbidities, borderline smoldering multiple myeloma status.  Multiple myeloma panel also stable.  #History of prostate cancer, patient reports that he is not currently following up with anyone.  PSA is stable.  Follow-up in 8 weeks with reevaluation of blood work and MD reassessment.  No orders of the defined types  were placed in this encounter.   Earlie Server, MD, PhD Hematology Oncology [facility] Pager- [phone] 01/16/2020

## 2020-01-31 ENCOUNTER — Ambulatory Visit: Payer: Medicare PPO | Admitting: Podiatry

## 2020-01-31 ENCOUNTER — Other Ambulatory Visit: Payer: Self-pay

## 2020-01-31 ENCOUNTER — Encounter: Payer: Self-pay | Admitting: Podiatry

## 2020-01-31 DIAGNOSIS — M79676 Pain in unspecified toe(s): Secondary | ICD-10-CM | POA: Diagnosis not present

## 2020-01-31 DIAGNOSIS — B351 Tinea unguium: Secondary | ICD-10-CM | POA: Diagnosis not present

## 2020-01-31 DIAGNOSIS — M2011 Hallux valgus (acquired), right foot: Secondary | ICD-10-CM

## 2020-01-31 DIAGNOSIS — I739 Peripheral vascular disease, unspecified: Secondary | ICD-10-CM | POA: Diagnosis not present

## 2020-01-31 DIAGNOSIS — D689 Coagulation defect, unspecified: Secondary | ICD-10-CM | POA: Diagnosis not present

## 2020-01-31 DIAGNOSIS — M2012 Hallux valgus (acquired), left foot: Secondary | ICD-10-CM

## 2020-01-31 NOTE — Progress Notes (Signed)
This patient returns to my office for at risk foot care.  This patient requires this care by a professional since this patient will be at risk due to having PAD and diabetes. And coagulation defect.  Patient is taking eliquiss.  This patient is unable to cut nails himself since the patient cannot reach his nails.These nails are painful walking and wearing shoes.  This patient presents for at risk foot care today.  General Appearance  Alert, conversant and in no acute stress.  Vascular  Dorsalis pedis and posterior tibial  pulses are not  palpable  bilaterally.  Capillary return is within normal limits  bilaterally. Temperature is within normal limits  bilaterally.  Neurologic  Senn-Weinstein monofilament wire test within normal limits  bilaterally. Muscle power within normal limits bilaterally.  Nails Thick disfigured discolored nails with subungual debris  from hallux to fifth toes bilaterally. No evidence of bacterial infection or drainage bilaterally.  Orthopedic  No limitations of motion  feet .  No crepitus or effusions noted.  No bony pathology or digital deformities noted.  HAV wuth hammer toe  B/L.  Rigid contracted second digit right foot.  Skin  normotropic skin with no porokeratosis noted bilaterally.  No signs of infections or ulcers noted.     Onychomycosis  Pain in right toes  Pain in left toes  Consent was obtained for treatment procedures.   Mechanical debridement of nails 1-5  bilaterally performed with a nail nipper.  Filed with dremel without incident.    Return office visit    10 weeks                 Told patient to return for periodic foot care and evaluation due to potential at risk complications.   Gardiner Barefoot DPM

## 2020-03-14 ENCOUNTER — Inpatient Hospital Stay: Payer: Medicare PPO | Attending: Oncology

## 2020-03-14 ENCOUNTER — Other Ambulatory Visit: Payer: Self-pay

## 2020-03-14 DIAGNOSIS — Z8546 Personal history of malignant neoplasm of prostate: Secondary | ICD-10-CM | POA: Diagnosis not present

## 2020-03-14 DIAGNOSIS — R8290 Unspecified abnormal findings in urine: Secondary | ICD-10-CM | POA: Diagnosis not present

## 2020-03-14 DIAGNOSIS — F17210 Nicotine dependence, cigarettes, uncomplicated: Secondary | ICD-10-CM | POA: Insufficient documentation

## 2020-03-14 DIAGNOSIS — N183 Chronic kidney disease, stage 3 unspecified: Secondary | ICD-10-CM | POA: Diagnosis present

## 2020-03-14 DIAGNOSIS — I129 Hypertensive chronic kidney disease with stage 1 through stage 4 chronic kidney disease, or unspecified chronic kidney disease: Secondary | ICD-10-CM | POA: Diagnosis present

## 2020-03-14 DIAGNOSIS — Z7901 Long term (current) use of anticoagulants: Secondary | ICD-10-CM | POA: Diagnosis not present

## 2020-03-14 DIAGNOSIS — D631 Anemia in chronic kidney disease: Secondary | ICD-10-CM | POA: Insufficient documentation

## 2020-03-14 DIAGNOSIS — Z79899 Other long term (current) drug therapy: Secondary | ICD-10-CM | POA: Diagnosis not present

## 2020-03-14 DIAGNOSIS — C9001 Multiple myeloma in remission: Secondary | ICD-10-CM | POA: Diagnosis not present

## 2020-03-14 DIAGNOSIS — D500 Iron deficiency anemia secondary to blood loss (chronic): Secondary | ICD-10-CM | POA: Insufficient documentation

## 2020-03-14 DIAGNOSIS — I951 Orthostatic hypotension: Secondary | ICD-10-CM

## 2020-03-14 DIAGNOSIS — Q2730 Arteriovenous malformation, site unspecified: Secondary | ICD-10-CM

## 2020-03-14 DIAGNOSIS — M069 Rheumatoid arthritis, unspecified: Secondary | ICD-10-CM | POA: Diagnosis not present

## 2020-03-14 DIAGNOSIS — R809 Proteinuria, unspecified: Secondary | ICD-10-CM | POA: Diagnosis not present

## 2020-03-14 LAB — CBC WITH DIFFERENTIAL/PLATELET
Abs Immature Granulocytes: 0.08 10*3/uL — ABNORMAL HIGH (ref 0.00–0.07)
Basophils Absolute: 0 10*3/uL (ref 0.0–0.1)
Basophils Relative: 0 %
Eosinophils Absolute: 0.3 10*3/uL (ref 0.0–0.5)
Eosinophils Relative: 4 %
HCT: 50.4 % (ref 39.0–52.0)
Hemoglobin: 16.7 g/dL (ref 13.0–17.0)
Immature Granulocytes: 1 %
Lymphocytes Relative: 13 %
Lymphs Abs: 1.2 10*3/uL (ref 0.7–4.0)
MCH: 27.6 pg (ref 26.0–34.0)
MCHC: 33.1 g/dL (ref 30.0–36.0)
MCV: 83.2 fL (ref 80.0–100.0)
Monocytes Absolute: 0.7 10*3/uL (ref 0.1–1.0)
Monocytes Relative: 8 %
Neutro Abs: 6.8 10*3/uL (ref 1.7–7.7)
Neutrophils Relative %: 74 %
Platelets: 220 10*3/uL (ref 150–400)
RBC: 6.06 MIL/uL — ABNORMAL HIGH (ref 4.22–5.81)
RDW: 21.6 % — ABNORMAL HIGH (ref 11.5–15.5)
WBC: 9.2 10*3/uL (ref 4.0–10.5)
nRBC: 0 % (ref 0.0–0.2)

## 2020-03-14 LAB — COMPREHENSIVE METABOLIC PANEL
ALT: 17 U/L (ref 0–44)
AST: 16 U/L (ref 15–41)
Albumin: 3.5 g/dL (ref 3.5–5.0)
Alkaline Phosphatase: 62 U/L (ref 38–126)
Anion gap: 8 (ref 5–15)
BUN: 16 mg/dL (ref 8–23)
CO2: 25 mmol/L (ref 22–32)
Calcium: 8.6 mg/dL — ABNORMAL LOW (ref 8.9–10.3)
Chloride: 106 mmol/L (ref 98–111)
Creatinine, Ser: 1.37 mg/dL — ABNORMAL HIGH (ref 0.61–1.24)
GFR calc non Af Amer: 49 mL/min — ABNORMAL LOW (ref >60)
Glucose, Bld: 95 mg/dL (ref 70–99)
Potassium: 4.5 mmol/L (ref 3.5–5.1)
Sodium: 139 mmol/L (ref 135–145)
Total Bilirubin: 0.6 mg/dL (ref 0.3–1.2)
Total Protein: 8 g/dL (ref 6.5–8.1)

## 2020-03-14 LAB — IRON AND TIBC
Iron: 38 ug/dL — ABNORMAL LOW (ref 45–182)
Saturation Ratios: 11 % — ABNORMAL LOW (ref 17.9–39.5)
TIBC: 356 ug/dL (ref 250–450)
UIBC: 318 ug/dL

## 2020-03-14 LAB — FERRITIN: Ferritin: 18 ng/mL — ABNORMAL LOW (ref 24–336)

## 2020-03-17 ENCOUNTER — Inpatient Hospital Stay: Payer: Medicare PPO

## 2020-03-17 ENCOUNTER — Encounter: Payer: Self-pay | Admitting: Oncology

## 2020-03-17 ENCOUNTER — Inpatient Hospital Stay (HOSPITAL_BASED_OUTPATIENT_CLINIC_OR_DEPARTMENT_OTHER): Payer: Medicare PPO | Admitting: Oncology

## 2020-03-17 ENCOUNTER — Other Ambulatory Visit: Payer: Self-pay

## 2020-03-17 VITALS — BP 107/72 | HR 87 | Temp 96.0°F | Resp 16 | Ht 71.0 in | Wt 199.0 lb

## 2020-03-17 VITALS — BP 103/66 | HR 69

## 2020-03-17 DIAGNOSIS — Z8546 Personal history of malignant neoplasm of prostate: Secondary | ICD-10-CM | POA: Diagnosis not present

## 2020-03-17 DIAGNOSIS — Q2730 Arteriovenous malformation, site unspecified: Secondary | ICD-10-CM

## 2020-03-17 DIAGNOSIS — D509 Iron deficiency anemia, unspecified: Secondary | ICD-10-CM

## 2020-03-17 DIAGNOSIS — D500 Iron deficiency anemia secondary to blood loss (chronic): Secondary | ICD-10-CM

## 2020-03-17 DIAGNOSIS — C9001 Multiple myeloma in remission: Secondary | ICD-10-CM

## 2020-03-17 DIAGNOSIS — I129 Hypertensive chronic kidney disease with stage 1 through stage 4 chronic kidney disease, or unspecified chronic kidney disease: Secondary | ICD-10-CM | POA: Diagnosis not present

## 2020-03-17 LAB — KAPPA/LAMBDA LIGHT CHAINS
Kappa free light chain: 136.3 mg/L — ABNORMAL HIGH (ref 3.3–19.4)
Kappa, lambda light chain ratio: 3.88 — ABNORMAL HIGH (ref 0.26–1.65)
Lambda free light chains: 35.1 mg/L — ABNORMAL HIGH (ref 5.7–26.3)

## 2020-03-17 MED ORDER — IRON SUCROSE 20 MG/ML IV SOLN
200.0000 mg | Freq: Once | INTRAVENOUS | Status: AC
  Administered 2020-03-17: 200 mg via INTRAVENOUS
  Filled 2020-03-17: qty 10

## 2020-03-17 MED ORDER — SODIUM CHLORIDE 0.9 % IV SOLN
INTRAVENOUS | Status: DC
  Filled 2020-03-17: qty 250

## 2020-03-17 NOTE — Progress Notes (Signed)
Hematology/Oncology Follow Up Note [facility] [phone] [phone]) [phone]  Patient Care Team: Jodi Marble, MD as PCP - General (Internal Medicine) Bary Castilla, Forest Gleason, MD (General Surgery) Edrick Kins, MD as Rounding Team (Internal Medicine) Hillary Bow, MD (Inactive) as Consulting Physician (Internal Medicine) [name], MD as Surgeon (Gastroenterology) Isaias Cowman, MD as Consulting Physician (Cardiology) Earlie Server, MD as Consulting Physician (Oncology)  REFERRING PROVIDER: Dr.Lateef, [place]  REASON FOR VISIT Follow up for management of plasma cell dyscrasia.  And iron deficiency anemia.   HISTORY OF PRESENTING ILLNESS:  79 y.o.  male with PMH listed below who presents to follow up on the evaluation and management of his abnormal urine protein electrophoresis results. I reviewed the records from [facility] [place] [place], [place] and [place] was performed and HemOnc related medical problems are listed below.  He lives with a friend. He has 3 adult children.   1 Chronic Kidney disease, Stage III: patient follows up with Dr.Lateef.  2 Proteinuria:  02/18/2017: Albumin/Creatinine ratio was 7, Urine protein electrophoresis,Random Urine revealed M Spike 43.2%, total protein of 43.39m/dl,  01/05/2017 Albumin/Creatinine ratio was 455.2, Albumin 996.5, , 2.15.2018 Albumin/Creatinine ratio was 80.3, Albumin 73.3, Urine protein electrophoresis,Random Urine revealed M Spike 29.7%, total protein of 29.124mdl, 06/30/2016 Serum protein electrophoresis did not detect M spike.  Autoimmune disorder work up showed Negative anti dsDNA, RNP antibodies, smith antibody, Sjogren antibodies, anti Jo-1, positive antichromotin antibodies, positive ANA,  3 Anemia of chronic kidney disease:  4 Iron deficiency anemia: history of iron deficiency, ferritin was 7 on 08/11/2016. He had EGD and colonoscopy that were done  this year which showed duodenitis/esphagitis/diverticulosis/benigh polyps. # [facility]  5 Rheumatoid arthritis: he follows up with Dr.Kernodle and is on chronic MTX. He Is currently off MTX reports joint pains are controlled, not quite symptomatic.   Patient denies any persistent bone pain or any pain. He continue to feel lack of energy, persistent, not improved with resting or taking naps. He also has lost 25 pounds in the past year which was unintentional.   # Rheumatoid arthritis and currently is off MTX.  # It is ambiguous whether he has truly symptomatic multiple myeloma or a smoldering myeloma, given that anemia can be secondary to iron deficiency and CKD.  The hypermetabolic 2.1 cm hypermetabolic soft tissue lesion in the left heel, can reflect a Plasmacytoma,which should have been biopsied. However patient initially denies being on any blood thinner, later he was found to be on Plavix with pre biopsy questionnaire screening, and biopsy was held. Patient has to contact cardiologist to hold plavix for seven days before procedure. He feels frustrated about multiple workup and meanwhile he becomes more fatigues, with worsening of kidney function. Patient was reluctant about proceeding additional invasive procedures and wants to start treatment. Decision was made to start active MM treatment.   # [facility]. He is not considered to  good candidate for transplant. Patient cardiologist Dr. KaChancy Milroyad started patient on Lasix 20 mg daily. [facility] as patient was having dizziness and borderline blood pressure during his clinic visit there.  10/02/2018 He had follow-up left foot/ankle x-ray as a follow-up of his left heel lesion which was initially presented on PET scan and not detectable on follow-up PET scan.  Images were reviewed by me.  No  suspicious acute or subacute osseous abnormality  Current Treatment:  S/p RVD x 4 cycles tolerates well except cytopenia.  Revlimid 10 mg PO once per day on days 1 to 14 (dose reduced due to GFR) Revlimid renal dosing of $RemoveB'10mg'sTqiOoYc$ ,  Velcade 1.3 mg/m2 IV once per day on days 1,  8, 15 Dexamethasone 20 mg PO once per day on days 1, 8, 15 given patient's diabetes Given patient's multiple comorbidity and the side effects of cytopenia during his RVD treatment and the uncertainty of this is really a site of plasmacytoma or not, I recommend not to continue him on maintenance Revlimid.   # repeat small bowel capsule study on 02/16/2019 Which showed multiple AVMs seen in the mid jejunum proximal to the site of prior tattoo. # Patient was referred to [facility] and he is status post 06/19/2019 double-balloon enteroscopy with findings of 5 AVMs in the jejunum and 2 AVMs in the duodenum and AVMs were ablated with APC.  Tattoo was seen in the area. If further bleeding, recommend to consider treatment with octreotide. Patient takes Protonix daily.    INTERVAL HISTORY Patient presents for follow-up of management of multiple myeloma/smoldering multiple myeloma and iron deficiency anemia.Patient is not currently on any maintenance treatments. Patient reports feeling well. No black or bloody stool, easy bruising.  Fatigue level is stable.  Denies hematochezia, hematuria, hematemesis, epistaxis, black tarry stool or easy bruising.   Review of Systems  Constitutional: Negative for appetite change, chills, fatigue, fever and unexpected weight change.  HENT:   Negative for hearing loss and voice change.   Eyes: Negative for eye problems and icterus.  Respiratory: Negative for chest tightness, cough and shortness of breath.   Cardiovascular: Negative for chest pain and leg swelling.  Gastrointestinal: Negative for abdominal distention, abdominal pain and diarrhea.  Endocrine: Negative for hot flashes.  Genitourinary:  Negative for difficulty urinating, dysuria and frequency.   Musculoskeletal: Positive for arthralgias.       Nodules of hand and feet joints  Skin: Negative for itching and rash.  Neurological: Negative for light-headedness and numbness.  Hematological: Negative for adenopathy. Does not bruise/bleed easily.  Psychiatric/Behavioral: Negative for confusion.     MEDICAL HISTORY:  Past Medical History:  Diagnosis Date  . Anxiety   . Arthritis    rheumatoid  . Atrial fibrillation (HCC)    hx of  . Cancer ([place])   . Chronic kidney disease   . Colon polyp 07-07-15   TUBULAR ADENOMA WITH AT LEAST HIGH-GRADE / Dr Rayann Heman  . Diabetes mellitus without complication ([place])   . Dysrhythmia    bradycardia...Marland KitchenMarland Kitchen2 to 1 heart block  . GERD (gastroesophageal reflux disease)   . Heart murmur    patient unaware of history of murmur  . Hypercholesterolemia   . Hypertension   . Presence of permanent cardiac pacemaker   . Prostate cancer ([place]) 01/01/13, 01/30/14   Gleason 3+4=7, volume 46.6 cc  . Rheumatoid arthritis ([place])   . S/P radiation therapy  04/03/2014 through 06/04/2014                                                      Prostate 7800 cGy in 40 sessions                          .  Sleep apnea    uses cpap but it is not in the hospital with him    SURGICAL HISTORY: Past Surgical History:  Procedure Laterality Date  . ABDOMINAL AORTIC ANEURYSM REPAIR  11/2013  . COLON SURGERY  March 2017   Right hemicolectomy for tubulovillous adenoma with high-grade dysplasia.  . COLONOSCOPY WITH PROPOFOL N/A 07/07/2015   Procedure: COLONOSCOPY WITH PROPOFOL;  Surgeon: Josefine Class, MD;  Location: [facility] ENDOSCOPY;  Service: Endoscopy;  Laterality: N/A;  . COLONOSCOPY WITH PROPOFOL N/A 10/28/2016   Procedure: COLONOSCOPY WITH PROPOFOL;  Surgeon: [name], MD;  Location[facility] ENDOSCOPY;  Service: Endoscopy;  Laterality: N/A;  . ESOPHAGOGASTRODUODENOSCOPY (EGD) WITH PROPOFOL N/A 10/28/2016   Procedure:  ESOPHAGOGASTRODUODENOSCOPY (EGD) WITH PROPOFOL;  Surgeon: [name], MD;  Location: [facility] ENDOSCOPY;  Service: Endoscopy;  Laterality: N/A;  . GIVENS CAPSULE STUDY N/A 07/25/2017   Procedure: GIVENS CAPSULE STUDY;  Surgeon: [name], MD;  Location: [facility] ENDOSCOPY;  Service: Gastroenterology;  Laterality: N/A;  . GIVENS CAPSULE STUDY N/A 01/29/2019   Procedure: GIVENS CAPSULE STUDY;  Surgeon: [name], MD;  Location: [facility] ENDOSCOPY;  Service: Gastroenterology;  Laterality: N/A;  . LAPAROSCOPIC RIGHT COLECTOMY Right 08/08/2015   Procedure: LAPAROSCOPIC RIGHT COLECTOMY;  Surgeon: Robert Bellow, MD;  Location: [facility] ORS;  Service: General;  Laterality: Right;  . NASAL SINUS SURGERY    . PACEMAKER INSERTION Left 08/26/2016   Procedure: INSERTION PACEMAKER;  Surgeon: Isaias Cowman, MD;  Location: [facility] ORS;  Service: Cardiovascular;  Laterality: Left;  . PROSTATE BIOPSY  01/01/13, 01/30/14   Gleason 3+3=6, vol 46.6 cc  . TONSILLECTOMY    . uvula surgery     for sleep apnea    SOCIAL HISTORY: Social History   Socioeconomic History  . Marital status: Divorced    Spouse name: Not on file  . Number of children: Not on file  . Years of education: Not on file  . Highest education level: Not on file  Occupational History  . Not on file  Tobacco Use  . Smoking status: Current Every Day Smoker    Packs/day: 1.00    Years: 60.00    Pack years: 60.00    Types: Cigarettes  . Smokeless tobacco: Never Used  . Tobacco comment: DOWN TO 1/2 PPD  Vaping Use  . Vaping Use: Never used  Substance and Sexual Activity  . Alcohol use: Yes    Alcohol/week: 1.0 standard drink    Types: 1 Cans of beer per week    Comment: occasionally  . Drug use: No  . Sexual activity: Not Currently  Other Topics Concern  . Not on file  Social History Narrative  . Not on file   Social Determinants of Health   Financial Resource Strain:   . Difficulty of Paying Living Expenses: Not on file  Food  Insecurity:   . Worried About Charity fundraiser in the Last Year: Not on file  . Ran Out of Food in the Last Year: Not on file  Transportation Needs:   . Lack of Transportation (Medical): Not on file  . Lack of Transportation (Non-Medical): Not on file  Physical Activity:   . Days of Exercise per Week: Not on file  . Minutes of Exercise per Session: Not on file  Stress:   . Feeling of Stress : Not on file  Social Connections:   . Frequency of Communication with Friends and Family: Not on file  . Frequency of Social Gatherings with Friends and Family:  Not on file  . Attends Religious Services: Not on file  . Active Member of Clubs or Organizations: Not on file  . Attends Archivist Meetings: Not on file  . Marital Status: Not on file  Intimate Partner Violence:   . Fear of Current or Ex-Partner: Not on file  . Emotionally Abused: Not on file  . Physically Abused: Not on file  . Sexually Abused: Not on file    FAMILY HISTORY: Family History  Problem Relation Age of Onset  . Heart attack Mother   . Cirrhosis Father   . Pancreatic cancer Brother   . Diabetes Brother   . Diabetes Daughter        medication induced for cancer treatments  . Cancer Daughter        breast, brain    ALLERGIES:  has No Known Allergies.  MEDICATIONS:  Current Outpatient Medications  Medication Sig Dispense Refill  . acetaminophen (TYLENOL) 325 MG tablet Take 325 mg by mouth every 6 (six) hours as needed.    Marland Kitchen albuterol (PROVENTIL HFA;VENTOLIN HFA) 108 (90 Base) MCG/ACT inhaler     . ALPRAZolam (XANAX) 0.25 MG tablet Take by mouth 2 (two) times daily as needed.     Marland Kitchen amLODipine-benazepril (LOTREL) 5-20 MG capsule Take 1 capsule by mouth daily.     Marland Kitchen apixaban (ELIQUIS) 2.5 MG TABS tablet Take 2.5 mg by mouth 2 (two) times daily.     Marland Kitchen atorvastatin (LIPITOR) 80 MG tablet Take by mouth.    . benazepril (LOTENSIN) 20 MG tablet Take 20 mg by mouth daily.    . Butalbital-APAP-Caffeine  (FIORICET) 50-300-40 MG CAPS Take 1 capsule every four to six hours as needed    . Cholecalciferol 1.25 MG (50000 UT) capsule Take by mouth.     . colchicine 0.6 MG tablet Take by mouth.     . ezetimibe (ZETIA) 10 MG tablet Take 1 tablet by mouth 1 day or 1 dose.     . fexofenadine (ALLEGRA) 180 MG tablet Take by mouth.    . fluticasone (FLONASE) 50 MCG/ACT nasal spray Place 2 sprays into both nostrils daily as needed for rhinitis.     . Fluticasone Furoate-Vilanterol (BREO ELLIPTA) 100-25 MCG/INH AEPB Inhale 1 puff into the lungs daily.     . furosemide (LASIX) 20 MG tablet Take 20 mg by mouth daily.    Marland Kitchen gabapentin (NEURONTIN) 400 MG capsule Take by mouth.    Marland Kitchen glucose blood (ONETOUCH ULTRA) test strip     . hydroxychloroquine (PLAQUENIL) 200 MG tablet Take by mouth.    . isosorbide mononitrate (IMDUR) 30 MG 24 hr tablet Take 30 mg by mouth daily.    Marland Kitchen JARDIANCE 25 MG TABS tablet     . loperamide (IMODIUM) 2 MG capsule Take 1 capsule (2 mg total) by mouth See admin instructions. With onset of diarrhea, take $RemoveBefore'4mg'YHVzvlBYMxQLy$  followed by $RemoveBef'2mg'zgVuaxmZGW$  every 2 hours resolved. Maximum: 16 mg/day 120 capsule 0  . metoprolol succinate (TOPROL-XL) 25 MG 24 hr tablet Take 25 mg by mouth daily.     . ondansetron (ZOFRAN) 8 MG tablet Take 1 tablet (8 mg total) 2 (two) times daily as needed by mouth (Nausea or vomiting). 30 tablet 1  . pantoprazole (PROTONIX) 20 MG tablet Take 20 mg by mouth daily.    . tamsulosin (FLOMAX) 0.4 MG CAPS capsule Take 1 capsule (0.4 mg total) by mouth daily after supper. 90 capsule 3  . tiZANidine (ZANAFLEX) 2 MG tablet Take  2 mg by mouth 3 (three) times daily.    . traMADol-acetaminophen (ULTRACET) 37.5-325 MG tablet     . triamcinolone cream (KENALOG) 0.1 % Apply 1 application topically 2 (two) times daily.    Marland Kitchen UNABLE TO FIND SMARTSIG:1 Capsule(s) By Mouth Once a Week     No current facility-administered medications for this visit.   Facility-Administered Medications Ordered in Other Visits   Medication Dose Route Frequency Provider Last Rate Last Admin  . 0.9 %  sodium chloride infusion   Intravenous Continuous Earlie Server, MD   Paused at 03/17/20 1435      .  PHYSICAL EXAMINATION: ECOG PERFORMANCE STATUS: 1 - Symptomatic but completely ambulatory Vitals:   03/17/20 1312  BP: 107/72  Pulse: 87  Resp: 16  Temp: (!) 96 F (35.6 C)  SpO2: 97%   Filed Weights   03/17/20 1312  Weight: 199 lb (90.3 kg)   Physical Exam Constitutional:      General: He is not in acute distress.    Appearance: He is not diaphoretic.  HENT:     Head: Normocephalic and atraumatic.     Nose: Nose normal.     Mouth/Throat:     Pharynx: No oropharyngeal exudate.  Eyes:     General: No scleral icterus.       Right eye: No discharge.        Left eye: No discharge.     Pupils: Pupils are equal, round, and reactive to light.  Neck:     Vascular: No JVD.  Cardiovascular:     Rate and Rhythm: Normal rate and regular rhythm.     Heart sounds: Normal heart sounds. No murmur heard.  No friction rub.  Pulmonary:     Effort: Pulmonary effort is normal. No respiratory distress.     Breath sounds: Normal breath sounds. No wheezing or rales.  Chest:     Chest wall: No tenderness.  Abdominal:     General: Bowel sounds are normal. There is no distension.     Palpations: Abdomen is soft. There is no mass.     Tenderness: There is no abdominal tenderness.  Musculoskeletal:        General: No tenderness. Normal range of motion.     Cervical back: Normal range of motion and neck supple.  Lymphadenopathy:     Cervical: No cervical adenopathy.  Skin:    General: Skin is warm and dry.     Findings: No erythema.     Comments: Hypopigmentation of skin  Neurological:     Mental Status: He is alert and oriented to person, place, and time.  Psychiatric:        Mood and Affect: Affect normal.     LABORATORY DATA:  I have reviewed the data as listed Lab Results  Component Value Date   WBC 9.2  03/14/2020   HGB 16.7 03/14/2020   HCT 50.4 03/14/2020   MCV 83.2 03/14/2020   PLT 220 03/14/2020   Recent Labs    09/17/19 1057 11/12/19 1128 03/14/20 1115  NA 138 137 139  K 4.6 5.0 4.5  CL 108 106 106  CO2 $Re'24 23 25  'Cjz$ GLUCOSE 93 94 95  BUN $Re'21 23 16  'RJw$ CREATININE 1.51* 1.90* 1.37*  CALCIUM 8.6* 8.6* 8.6*  GFRNONAA 44* 33* 49*  GFRAA 51* 39*  --   PROT 7.5 8.0 8.0  ALBUMIN 3.4* 3.7 3.5  AST $Re'15 21 16  'NmU$ ALT $R'14 22 17  'dU$ ALKPHOS 55 69  67  BILITOT 0.5 0.5 0.6    SPEP: no M spike Serum free light chain Kappa/Lamda ratio 13.04 UPEP  Free light chain Kappa/lamda ratio 162.43, M spike $Remov'198mg'tYFJIO$ /24 hour   Bone marrow biopsy 03/21/2017  Bone Marrow, Aspirate,Biopsy, and Clot, left iliac - HYPERCELLULAR BONE MARROW FOR AGE WITH TRILINEAGE HEMATOPOIESIS. - PLASMACYTOSIS (PLASMA CELLS 8%)  Karyotype: loss of Y chromosome Cytogenetic MDS FISH Panel negative.   Bone marrow 04/06/2017  Bone Marrow, Aspirate,Biopsy, and Clot, right iliac and core - HYPERCELLULAR BONE MARROW FOR AGE WITH PLASMA CELL NEOPLASM - TRILINEAGE HEMATOPOIESIS. - SEE COMMENT. PERIPHERAL BLOOD: - MICROCYTIC-HYPOCHROMIC ANEMIA. Diagnosis Note The bone marrow is hypercellular with trilineage hematopoiesis but with relative abundance of erythroid precursors and increased number of megakaryocytes with nonspecific changes. Significant dyspoiesis is not seen. Iron stores are present with no ring sideroblasts. The plasma cells are increased in number representing 8% of all cells in the aspirate although focal areas in the core biopsy show 10 to 20% as primarily seen by CD138 stain. In situ hybridization for kappa and lambda light chains show kappa light chain restriction consistent with plasma cell neoplasm. Correlation with cytogenetic and FISH studies is recommended. (BNS:ecj/gt 04/07/2017)  IMAGE STUDIES I have personally reviewed below image results.  03/15/2017 DG bone survey Met: Nonspecific lucent lesion in the  distal right ulna measuring 15-16 mm, but otherwise normal bone mineralization for age throughout the visible skeleton. A solitary lytic lesion in a distal extremity would be an unusual presentation of multiple myeloma, and I favor the distal right ulna lesion is benign. Recommend correlation with serum and urine protein electrophoresis.  04/14/2017 PET scan: 1. 2.1 cm hypermetabolic soft tissue lesion in the left heel, adjacent to the calcaneal tuberosity. 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 2. Mottled FDG accumulation diffusely in the marrow space without other frankly overt hypermetabolic bony lesion. 3. Coronary artery and thoracoabdominal aortic atherosclerosis with abdominal aortic stent graft visualized in situ. 4. Bilateral renal cysts and bilateral nonobstructing renal stones.  08/24/2017 PET scan 1. No hypermetabolic osseous lesions. No definite signs of multiple myeloma on CT imaging. 2. Interval improvement in soft tissue activity along the medial aspect of the left calcaneal tuberosity, likely plantar fasciitis. 3. New small left pleural effusion. 4. Bilateral inguinal hernias containing small bowel on the right and sigmoid colon on the left. No evidence of incarceration or obstruction. 5. Otherwise stable incidental findings including diffuse atherosclerosis, bilateral renal cysts, emphysema and sigmoid diverticulosis.  ASSESSMENT & PLAN:  1. Multiple myeloma in remission ([place])   2. Iron deficiency anemia due to chronic blood loss   3. AVM (arteriovenous malformation)   4. History of prostate cancer    #Iron deficiency anemia secondary to chronic blood loss due to multiple AVM in small bowels. Status post enteroscopy at [facility] for AVM ablation. Labs are reviewed and discussed with patient.improved iron panel. Patient will proceed with IV Venofer 200 mg x 1 today.  #Smoldering multiple myeloma/active myeloma if previous hypermetabolic lesion on PET scan was related to  myeloma. Patient was treated with 4 cycles of RVD.  He is not currently on any maintenance therapy due to multiple comorbidities, borderline smoldering multiple myeloma status.  Light chain ratio is stable, 3.88.  #History of prostate cancer, patient reports that he is not currently following up with anyone.  PSA checked in April 2021.  Follow-up in 12 weeks with reevaluation of blood work and MD reassessment.  Orders Placed This Encounter  Procedures  .  CBC with Differential/Platelet    Standing Status:   Future    Standing Expiration Date:   03/17/2021  . Comprehensive metabolic panel    Standing Status:   Future    Standing Expiration Date:   03/17/2021  . Ferritin    Standing Status:   Future    Standing Expiration Date:   03/17/2021  . Iron and TIBC    Standing Status:   Future    Standing Expiration Date:   03/17/2021  . Kappa/lambda light chains    Standing Status:   Future    Standing Expiration Date:   03/17/2021    Earlie Server, MD, PhD Hematology Oncology [name] at [facility] Pager- [phone] 03/17/2020

## 2020-04-14 ENCOUNTER — Ambulatory Visit: Payer: Medicare PPO | Admitting: Podiatry

## 2020-04-14 ENCOUNTER — Encounter: Payer: Self-pay | Admitting: Podiatry

## 2020-04-14 ENCOUNTER — Other Ambulatory Visit: Payer: Self-pay

## 2020-04-14 DIAGNOSIS — B351 Tinea unguium: Secondary | ICD-10-CM

## 2020-04-14 DIAGNOSIS — M2012 Hallux valgus (acquired), left foot: Secondary | ICD-10-CM

## 2020-04-14 DIAGNOSIS — M79609 Pain in unspecified limb: Secondary | ICD-10-CM

## 2020-04-14 DIAGNOSIS — E1140 Type 2 diabetes mellitus with diabetic neuropathy, unspecified: Secondary | ICD-10-CM

## 2020-04-14 DIAGNOSIS — D689 Coagulation defect, unspecified: Secondary | ICD-10-CM

## 2020-04-14 DIAGNOSIS — I739 Peripheral vascular disease, unspecified: Secondary | ICD-10-CM

## 2020-04-14 DIAGNOSIS — M2011 Hallux valgus (acquired), right foot: Secondary | ICD-10-CM

## 2020-04-14 NOTE — Progress Notes (Signed)
This patient returns to my office for at risk foot care.  This patient requires this care by a professional since this patient will be at risk due to having PAD and diabetes. And coagulation defect.  Patient is taking eliquiss.  This patient is unable to cut nails himself since the patient cannot reach his nails.These nails are painful walking and wearing shoes.  This patient presents for at risk foot care today.  General Appearance  Alert, conversant and in no acute stress.  Vascular  Dorsalis pedis and posterior tibial  pulses are not  palpable  bilaterally.  Capillary return is within normal limits  bilaterally. Temperature is within normal limits  bilaterally.  Neurologic  Senn-Weinstein monofilament wire test within normal limits  bilaterally. Muscle power within normal limits bilaterally.  Nails Thick disfigured discolored nails with subungual debris  from hallux to fifth toes bilaterally. No evidence of bacterial infection or drainage bilaterally.  Orthopedic  No limitations of motion  feet .  No crepitus or effusions noted.  No bony pathology or digital deformities noted.  HAV wuth hammer toe  B/L.  Rigid contracted second digit right foot.  Skin  normotropic skin with no porokeratosis noted bilaterally.  No signs of infections or ulcers noted.     Onychomycosis  Pain in right toes  Pain in left toes  Consent was obtained for treatment procedures.   Mechanical debridement of nails 1-5  bilaterally performed with a nail nipper.  Filed with dremel without incident.    Return office visit    10 weeks                 Told patient to return for periodic foot care and evaluation due to potential at risk complications.   Gardiner Barefoot DPM

## 2020-04-16 ENCOUNTER — Ambulatory Visit (INDEPENDENT_AMBULATORY_CARE_PROVIDER_SITE_OTHER): Payer: Medicare PPO | Admitting: Orthotics

## 2020-04-16 ENCOUNTER — Other Ambulatory Visit: Payer: Self-pay

## 2020-04-16 DIAGNOSIS — E1140 Type 2 diabetes mellitus with diabetic neuropathy, unspecified: Secondary | ICD-10-CM

## 2020-04-29 DIAGNOSIS — K759 Inflammatory liver disease, unspecified: Secondary | ICD-10-CM | POA: Insufficient documentation

## 2020-04-29 DIAGNOSIS — D649 Anemia, unspecified: Secondary | ICD-10-CM | POA: Insufficient documentation

## 2020-05-28 ENCOUNTER — Ambulatory Visit (INDEPENDENT_AMBULATORY_CARE_PROVIDER_SITE_OTHER): Payer: Medicare PPO | Admitting: Orthotics

## 2020-05-28 ENCOUNTER — Other Ambulatory Visit: Payer: Self-pay

## 2020-05-28 DIAGNOSIS — I739 Peripheral vascular disease, unspecified: Secondary | ICD-10-CM

## 2020-05-28 DIAGNOSIS — B351 Tinea unguium: Secondary | ICD-10-CM

## 2020-05-28 DIAGNOSIS — M2011 Hallux valgus (acquired), right foot: Secondary | ICD-10-CM

## 2020-05-28 DIAGNOSIS — M2012 Hallux valgus (acquired), left foot: Secondary | ICD-10-CM | POA: Diagnosis not present

## 2020-05-28 DIAGNOSIS — D689 Coagulation defect, unspecified: Secondary | ICD-10-CM

## 2020-05-28 DIAGNOSIS — E1140 Type 2 diabetes mellitus with diabetic neuropathy, unspecified: Secondary | ICD-10-CM

## 2020-05-28 NOTE — Progress Notes (Signed)

## 2020-06-18 ENCOUNTER — Other Ambulatory Visit: Payer: Self-pay

## 2020-06-18 ENCOUNTER — Inpatient Hospital Stay: Payer: Medicare PPO | Attending: Oncology

## 2020-06-18 DIAGNOSIS — M069 Rheumatoid arthritis, unspecified: Secondary | ICD-10-CM | POA: Diagnosis not present

## 2020-06-18 DIAGNOSIS — R809 Proteinuria, unspecified: Secondary | ICD-10-CM | POA: Diagnosis not present

## 2020-06-18 DIAGNOSIS — N183 Chronic kidney disease, stage 3 unspecified: Secondary | ICD-10-CM | POA: Insufficient documentation

## 2020-06-18 DIAGNOSIS — D500 Iron deficiency anemia secondary to blood loss (chronic): Secondary | ICD-10-CM

## 2020-06-18 DIAGNOSIS — C9001 Multiple myeloma in remission: Secondary | ICD-10-CM

## 2020-06-18 DIAGNOSIS — F17210 Nicotine dependence, cigarettes, uncomplicated: Secondary | ICD-10-CM | POA: Insufficient documentation

## 2020-06-18 DIAGNOSIS — Z8546 Personal history of malignant neoplasm of prostate: Secondary | ICD-10-CM | POA: Insufficient documentation

## 2020-06-18 DIAGNOSIS — Q2733 Arteriovenous malformation of digestive system vessel: Secondary | ICD-10-CM | POA: Diagnosis not present

## 2020-06-18 DIAGNOSIS — I129 Hypertensive chronic kidney disease with stage 1 through stage 4 chronic kidney disease, or unspecified chronic kidney disease: Secondary | ICD-10-CM | POA: Diagnosis present

## 2020-06-18 DIAGNOSIS — Z7901 Long term (current) use of anticoagulants: Secondary | ICD-10-CM | POA: Diagnosis not present

## 2020-06-18 DIAGNOSIS — D631 Anemia in chronic kidney disease: Secondary | ICD-10-CM | POA: Diagnosis present

## 2020-06-18 DIAGNOSIS — Z79899 Other long term (current) drug therapy: Secondary | ICD-10-CM | POA: Insufficient documentation

## 2020-06-18 LAB — CBC WITH DIFFERENTIAL/PLATELET
Abs Immature Granulocytes: 0.04 10*3/uL (ref 0.00–0.07)
Basophils Absolute: 0 10*3/uL (ref 0.0–0.1)
Basophils Relative: 1 %
Eosinophils Absolute: 0.3 10*3/uL (ref 0.0–0.5)
Eosinophils Relative: 4 %
HCT: 50.5 % (ref 39.0–52.0)
Hemoglobin: 16.7 g/dL (ref 13.0–17.0)
Immature Granulocytes: 1 %
Lymphocytes Relative: 16 %
Lymphs Abs: 1.3 10*3/uL (ref 0.7–4.0)
MCH: 28.7 pg (ref 26.0–34.0)
MCHC: 33.1 g/dL (ref 30.0–36.0)
MCV: 86.9 fL (ref 80.0–100.0)
Monocytes Absolute: 0.7 10*3/uL (ref 0.1–1.0)
Monocytes Relative: 8 %
Neutro Abs: 6 10*3/uL (ref 1.7–7.7)
Neutrophils Relative %: 70 %
Platelets: 164 10*3/uL (ref 150–400)
RBC: 5.81 MIL/uL (ref 4.22–5.81)
RDW: 18.2 % — ABNORMAL HIGH (ref 11.5–15.5)
WBC: 8.3 10*3/uL (ref 4.0–10.5)
nRBC: 0 % (ref 0.0–0.2)

## 2020-06-18 LAB — COMPREHENSIVE METABOLIC PANEL
ALT: 22 U/L (ref 0–44)
AST: 20 U/L (ref 15–41)
Albumin: 3.5 g/dL (ref 3.5–5.0)
Alkaline Phosphatase: 65 U/L (ref 38–126)
Anion gap: 6 (ref 5–15)
BUN: 12 mg/dL (ref 8–23)
CO2: 27 mmol/L (ref 22–32)
Calcium: 8.7 mg/dL — ABNORMAL LOW (ref 8.9–10.3)
Chloride: 105 mmol/L (ref 98–111)
Creatinine, Ser: 1.45 mg/dL — ABNORMAL HIGH (ref 0.61–1.24)
GFR, Estimated: 49 mL/min — ABNORMAL LOW (ref >60)
Glucose, Bld: 85 mg/dL (ref 70–99)
Potassium: 4.3 mmol/L (ref 3.5–5.1)
Sodium: 138 mmol/L (ref 135–145)
Total Bilirubin: 0.4 mg/dL (ref 0.3–1.2)
Total Protein: 7.5 g/dL (ref 6.5–8.1)

## 2020-06-18 LAB — IRON AND TIBC
Iron: 32 ug/dL — ABNORMAL LOW (ref 45–182)
Saturation Ratios: 9 % — ABNORMAL LOW (ref 17.9–39.5)
TIBC: 372 ug/dL (ref 250–450)
UIBC: 340 ug/dL

## 2020-06-18 LAB — FERRITIN: Ferritin: 19 ng/mL — ABNORMAL LOW (ref 24–336)

## 2020-06-19 LAB — KAPPA/LAMBDA LIGHT CHAINS
Kappa free light chain: 121.4 mg/L — ABNORMAL HIGH (ref 3.3–19.4)
Kappa, lambda light chain ratio: 4.65 — ABNORMAL HIGH (ref 0.26–1.65)
Lambda free light chains: 26.1 mg/L (ref 5.7–26.3)

## 2020-06-20 ENCOUNTER — Other Ambulatory Visit: Payer: Self-pay

## 2020-06-20 ENCOUNTER — Inpatient Hospital Stay (HOSPITAL_BASED_OUTPATIENT_CLINIC_OR_DEPARTMENT_OTHER): Payer: Medicare PPO | Admitting: Oncology

## 2020-06-20 ENCOUNTER — Inpatient Hospital Stay: Payer: Medicare PPO

## 2020-06-20 ENCOUNTER — Encounter: Payer: Self-pay | Admitting: Oncology

## 2020-06-20 VITALS — BP 113/79 | HR 66 | Temp 97.4°F | Resp 18 | Wt 206.3 lb

## 2020-06-20 VITALS — BP 107/64 | HR 64 | Resp 18

## 2020-06-20 DIAGNOSIS — Z8546 Personal history of malignant neoplasm of prostate: Secondary | ICD-10-CM

## 2020-06-20 DIAGNOSIS — Q2730 Arteriovenous malformation, site unspecified: Secondary | ICD-10-CM

## 2020-06-20 DIAGNOSIS — D509 Iron deficiency anemia, unspecified: Secondary | ICD-10-CM

## 2020-06-20 DIAGNOSIS — C9001 Multiple myeloma in remission: Secondary | ICD-10-CM

## 2020-06-20 DIAGNOSIS — I129 Hypertensive chronic kidney disease with stage 1 through stage 4 chronic kidney disease, or unspecified chronic kidney disease: Secondary | ICD-10-CM | POA: Diagnosis not present

## 2020-06-20 MED ORDER — FERROUS SULFATE 325 (65 FE) MG PO TBEC: 325.0000 mg | DELAYED_RELEASE_TABLET | Freq: Every day | ORAL | 1 refills | Status: DC

## 2020-06-20 MED ORDER — IRON SUCROSE 20 MG/ML IV SOLN
200.0000 mg | Freq: Once | INTRAVENOUS | Status: AC
  Administered 2020-06-20: 200 mg via INTRAVENOUS
  Filled 2020-06-20: qty 10

## 2020-06-20 MED ORDER — SODIUM CHLORIDE 0.9 % IV SOLN
Freq: Once | INTRAVENOUS | Status: DC
  Filled 2020-06-20: qty 250

## 2020-06-20 MED ORDER — SODIUM CHLORIDE 0.9 % IV SOLN
INTRAVENOUS | Status: DC
  Filled 2020-06-20: qty 250

## 2020-06-20 NOTE — Progress Notes (Signed)
Pt here for follow up. No new concerns voiced.   

## 2020-06-20 NOTE — Progress Notes (Signed)
Hematology/Oncology Follow Up Note [facility] [phone] [phone]) [phone]  Patient Care Team: Jodi Marble, MD as PCP - General (Internal Medicine) Bary Castilla, Forest Gleason, MD (General Surgery) Edrick Kins, MD as Rounding Team (Internal Medicine) Hillary Bow, MD (Inactive) as Consulting Physician (Internal Medicine) [name], MD as Surgeon (Gastroenterology) Isaias Cowman, MD as Consulting Physician (Cardiology) Earlie Server, MD as Consulting Physician (Oncology)  REFERRING PROVIDER: Dr.Lateef, [place]  REASON FOR VISIT Follow up for management of plasma cell dyscrasia.  And iron deficiency anemia.   HISTORY OF PRESENTING ILLNESS:  80 y.o.  male with PMH listed below who presents to follow up on the evaluation and management of his abnormal urine protein electrophoresis results. I reviewed the records from [facility] [place], [place] and [place] was performed and HemOnc related medical problems are listed below.  He lives with a friend. He has 3 adult children.   1 Chronic Kidney disease, Stage III: patient follows up with Dr.Lateef.  2 Proteinuria:  02/18/2017: Albumin/Creatinine ratio was 7, Urine protein electrophoresis,Random Urine revealed M Spike 43.2%, total protein of 43.39m/dl,  01/05/2017 Albumin/Creatinine ratio was 455.2, Albumin 996.5, , 2.15.2018 Albumin/Creatinine ratio was 80.3, Albumin 73.3, Urine protein electrophoresis,Random Urine revealed M Spike 29.7%, total protein of 29.124mdl, 06/30/2016 Serum protein electrophoresis did not detect M spike.  Autoimmune disorder work up showed Negative anti dsDNA, RNP antibodies, smith antibody, Sjogren antibodies, anti Jo-1, positive antichromotin antibodies, positive ANA,  3 Anemia of chronic kidney disease:  4 Iron deficiency anemia: history of iron deficiency, ferritin was 7 on 08/11/2016. He had EGD and colonoscopy that were done  this year which showed duodenitis/esphagitis/diverticulosis/benigh polyps. # [facility]  5 Rheumatoid arthritis: he follows up with Dr.Kernodle and is on chronic MTX. He Is currently off MTX reports joint pains are controlled, not quite symptomatic.   Patient denies any persistent bone pain or any pain. He continue to feel lack of energy, persistent, not improved with resting or taking naps. He also has lost 25 pounds in the past year which was unintentional.   # Rheumatoid arthritis and currently is off MTX.  # It is ambiguous whether he has truly symptomatic multiple myeloma or a smoldering myeloma, given that anemia can be secondary to iron deficiency and CKD.  The hypermetabolic 2.1 cm hypermetabolic soft tissue lesion in the left heel, can reflect a Plasmacytoma,which should have been biopsied. However patient initially denies being on any blood thinner, later he was found to be on Plavix with pre biopsy questionnaire screening, and biopsy was held. Patient has to contact cardiologist to hold plavix for seven days before procedure. He feels frustrated about multiple workup and meanwhile he becomes more fatigues, with worsening of kidney function. Patient was reluctant about proceeding additional invasive procedures and wants to start treatment. Decision was made to start active MM treatment.   # [facility]. He is not considered to  good candidate for transplant. Patient cardiologist Dr. KaChancy Milroyad started patient on Lasix 20 mg daily. [facility] as patient was having dizziness and borderline blood pressure during his clinic visit there.  10/02/2018 He had follow-up left foot/ankle x-ray as a follow-up of his left heel lesion which was initially presented on PET scan and not detectable on follow-up PET scan.  Images were reviewed by me.  No  suspicious acute or subacute osseous abnormality  Current Treatment:  S/p RVD x 4 cycles tolerates well except cytopenia.  Revlimid 10 mg PO once per day on days 1 to 14 (dose reduced due to GFR) Revlimid renal dosing of 85m,  Velcade 1.3 mg/m2 IV once per day on days 1,  8, 15 Dexamethasone 20 mg PO once per day on days 1, 8, 15 given patient's diabetes Given patient's multiple comorbidity and the side effects of cytopenia during his RVD treatment and the uncertainty of this is really a site of plasmacytoma or not, I recommend not to continue him on maintenance Revlimid.   # repeat small bowel capsule study on 02/16/2019 Which showed multiple AVMs seen in the mid jejunum proximal to the site of prior tattoo. # [facility]19/2019 double-balloon enteroscopy with findings of 5 AVMs in the jejunum and 2 AVMs in the duodenum and AVMs were ablated with APC.  Tattoo was seen in the area. If further bleeding, recommend to consider treatment with octreotide. Patient takes Protonix daily.    INTERVAL HISTORY Patient presents for follow-up of management of multiple myeloma/smoldering multiple myeloma and iron deficiency anemia.Patient is not currently on any maintenance treatments. Patient reports feeling well. No black or bloody stool, easy bruising.  Chronic back pain.    Review of Systems  Constitutional: Negative for appetite change, chills, fatigue, fever and unexpected weight change.  HENT:   Negative for hearing loss and voice change.   Eyes: Negative for eye problems and icterus.  Respiratory: Negative for chest tightness, cough and shortness of breath.   Cardiovascular: Negative for chest pain and leg swelling.  Gastrointestinal: Negative for abdominal distention, abdominal pain and diarrhea.  Endocrine: Negative for hot flashes.  Genitourinary: Negative for difficulty urinating, dysuria and frequency.   Musculoskeletal: Positive for arthralgias.   Skin: Negative for itching and rash.  Neurological: Negative for light-headedness and numbness.  Hematological: Negative for adenopathy. Does not bruise/bleed easily.  Psychiatric/Behavioral: Negative for confusion.     MEDICAL HISTORY:  Past Medical History:  Diagnosis Date  . Anxiety   . Arthritis    rheumatoid  . Atrial fibrillation (HCC)    hx of  . Cancer ([place]   . Chronic kidney disease   . Colon polyp 07-07-15   TUBULAR ADENOMA WITH AT LEAST HIGH-GRADE / Dr RRayann Heman . Diabetes mellitus without complication ([place]   . Dysrhythmia    bradycardia....Marland KitchenMarland Kitchen to 1 heart block  . GERD (gastroesophageal reflux disease)   . Heart murmur    patient unaware of history of murmur  . Hypercholesterolemia   . Hypertension   . Presence of permanent cardiac pacemaker   . Prostate cancer ([place] 01/01/13, 01/30/14   Gleason 3+4=7, volume 46.6 cc  . Rheumatoid arthritis ([place]   . S/P radiation therapy  04/03/2014 through 06/04/2014                                                      Prostate 7800 cGy in 40 sessions                          . Sleep apnea    uses cpap but it is not in the hospital with him    SURGICAL HISTORY:  Past Surgical History:  Procedure Laterality Date  . ABDOMINAL AORTIC ANEURYSM REPAIR  11/2013  . COLON SURGERY  March 2017   Right hemicolectomy for tubulovillous adenoma with high-grade dysplasia.  . COLONOSCOPY WITH PROPOFOL N/A 07/07/2015   Procedure: COLONOSCOPY WITH PROPOFOL;  Surgeon: Josefine Class, MD;  Location: [facility] ENDOSCOPY;  Service: Endoscopy;  Laterality: N/A;  . COLONOSCOPY WITH PROPOFOL N/A 10/28/2016   Procedure: COLONOSCOPY WITH PROPOFOL;  Surgeon: [name], MD;  Location: [facility] ENDOSCOPY;  Service: Endoscopy;  Laterality: N/A;  . ESOPHAGOGASTRODUODENOSCOPY (EGD) WITH PROPOFOL N/A 10/28/2016   Procedure: ESOPHAGOGASTRODUODENOSCOPY (EGD) WITH PROPOFOL;  Surgeon: [name], MD;  Location: [facility] ENDOSCOPY;  Service: Endoscopy;  Laterality: N/A;  .  GIVENS CAPSULE STUDY N/A 07/25/2017   Procedure: GIVENS CAPSULE STUDY;  Surgeon: [name], MD;  Location: [facility] ENDOSCOPY;  Service: Gastroenterology;  Laterality: N/A;  . GIVENS CAPSULE STUDY N/A 01/29/2019   Procedure: GIVENS CAPSULE STUDY;  Surgeon: [name], MD;  Location: [facility] ENDOSCOPY;  Service: Gastroenterology;  Laterality: N/A;  . LAPAROSCOPIC RIGHT COLECTOMY Right 08/08/2015   Procedure: LAPAROSCOPIC RIGHT COLECTOMY;  Surgeon: Robert Bellow, MD;  Location: [facility] ORS;  Service: General;  Laterality: Right;  . NASAL SINUS SURGERY    . PACEMAKER INSERTION Left 08/26/2016   Procedure: INSERTION PACEMAKER;  Surgeon: Isaias Cowman, MD;  Location: [facility] ORS;  Service: Cardiovascular;  Laterality: Left;  . PROSTATE BIOPSY  01/01/13, 01/30/14   Gleason 3+3=6, vol 46.6 cc  . TONSILLECTOMY    . uvula surgery     for sleep apnea    SOCIAL HISTORY: Social History   Socioeconomic History  . Marital status: Divorced    Spouse name: Not on file  . Number of children: Not on file  . Years of education: Not on file  . Highest education level: Not on file  Occupational History  . Not on file  Tobacco Use  . Smoking status: Current Every Day Smoker    Packs/day: 1.00    Years: 60.00    Pack years: 60.00    Types: Cigarettes  . Smokeless tobacco: Never Used  . Tobacco comment: DOWN TO 1/2 PPD  Vaping Use  . Vaping Use: Never used  Substance and Sexual Activity  . Alcohol use: Yes    Alcohol/week: 1.0 standard drink    Types: 1 Cans of beer per week    Comment: occasionally  . Drug use: No  . Sexual activity: Not Currently  Other Topics Concern  . Not on file  Social History Narrative  . Not on file   Social Determinants of Health   Financial Resource Strain: Not on file  Food Insecurity: Not on file  Transportation Needs: Not on file  Physical Activity: Not on file  Stress: Not on file  Social Connections: Not on file  Intimate Partner Violence: Not on file     FAMILY HISTORY: Family History  Problem Relation Age of Onset  . Heart attack Mother   . Cirrhosis Father   . Pancreatic cancer Brother   . Diabetes Brother   . Diabetes Daughter        medication induced for cancer treatments  . Cancer Daughter        breast, brain    ALLERGIES:  has No Known Allergies.  MEDICATIONS:  Current Outpatient Medications  Medication Sig Dispense Refill  . acetaminophen (TYLENOL) 325 MG tablet Take 325 mg by mouth every 6 (six) hours as needed.    Marland Kitchen albuterol (PROVENTIL HFA;VENTOLIN  HFA) 108 (90 Base) MCG/ACT inhaler     . ALPRAZolam (XANAX) 0.25 MG tablet Take by mouth 2 (two) times daily as needed.     Marland Kitchen amLODipine-benazepril (LOTREL) 5-20 MG capsule Take 1 capsule by mouth daily.     Marland Kitchen apixaban (ELIQUIS) 2.5 MG TABS tablet Take 2.5 mg by mouth 2 (two) times daily.     Marland Kitchen atorvastatin (LIPITOR) 80 MG tablet Take by mouth.    . Butalbital-APAP-Caffeine 50-300-40 MG CAPS Take 1 capsule every four to six hours as needed    . Cholecalciferol 1.25 MG (50000 UT) capsule Take by mouth.     . ezetimibe (ZETIA) 10 MG tablet Take 1 tablet by mouth 1 day or 1 dose.     . ferrous sulfate 325 (65 FE) MG EC tablet Take 1 tablet (325 mg total) by mouth daily with breakfast. 90 tablet 1  . fexofenadine (ALLEGRA) 180 MG tablet Take by mouth.    . fluticasone (FLONASE) 50 MCG/ACT nasal spray Place 2 sprays into both nostrils daily as needed for rhinitis.     . fluticasone furoate-vilanterol (BREO ELLIPTA) 100-25 MCG/INH AEPB Inhale 1 puff into the lungs daily.     . furosemide (LASIX) 20 MG tablet Take 20 mg by mouth daily.    Marland Kitchen gabapentin (NEURONTIN) 400 MG capsule Take by mouth.    Marland Kitchen glucose blood (ONETOUCH ULTRA) test strip     . HUMIRA PEN 40 MG/0.4ML PNKT     . hydroxychloroquine (PLAQUENIL) 200 MG tablet Take by mouth.    . isosorbide mononitrate (IMDUR) 30 MG 24 hr tablet Take 30 mg by mouth daily.    Marland Kitchen JARDIANCE 25 MG TABS tablet     . metoprolol  succinate (TOPROL-XL) 25 MG 24 hr tablet Take 25 mg by mouth daily.     . ondansetron (ZOFRAN) 8 MG tablet Take 1 tablet (8 mg total) 2 (two) times daily as needed by mouth (Nausea or vomiting). 30 tablet 1  . pantoprazole (PROTONIX) 20 MG tablet Take 20 mg by mouth daily.    . tamsulosin (FLOMAX) 0.4 MG CAPS capsule Take 1 capsule (0.4 mg total) by mouth daily after supper. 90 capsule 3  . tiZANidine (ZANAFLEX) 2 MG tablet Take 2 mg by mouth 3 (three) times daily.    . traMADol-acetaminophen (ULTRACET) 37.5-325 MG tablet     . triamcinolone cream (KENALOG) 0.1 % Apply 1 application topically 2 (two) times daily.    . benazepril (LOTENSIN) 20 MG tablet Take 20 mg by mouth daily.    . colchicine 0.6 MG tablet Take by mouth.  (Patient not taking: Reported on 06/20/2020)    . loperamide (IMODIUM) 2 MG capsule Take 1 capsule (2 mg total) by mouth See admin instructions. With onset of diarrhea, take 26m followed by 260mevery 2 hours resolved. Maximum: 16 mg/day (Patient not taking: Reported on 06/20/2020) 120 capsule 0  . UNABLE TO FIND SMARTSIG:1 Capsule(s) By Mouth Once a Week (Patient not taking: Reported on 06/20/2020)     No current facility-administered medications for this visit.      . Marland KitchenPHYSICAL EXAMINATION: ECOG PERFORMANCE STATUS: 1 - Symptomatic but completely ambulatory Vitals:   06/20/20 1313  BP: 113/79  Pulse: 66  Resp: 18  Temp: (!) 97.4 F (36.3 C)   Filed Weights   06/20/20 1313  Weight: 206 lb 4.8 oz (93.6 kg)   Physical Exam Constitutional:      General: He is not in acute distress.  Appearance: He is not diaphoretic.  HENT:     Head: Normocephalic and atraumatic.     Nose: Nose normal.     Mouth/Throat:     Pharynx: No oropharyngeal exudate.  Eyes:     General: No scleral icterus.       Right eye: No discharge.        Left eye: No discharge.     Pupils: Pupils are equal, round, and reactive to light.  Neck:     Vascular: No JVD.  Cardiovascular:      Rate and Rhythm: Normal rate and regular rhythm.     Heart sounds: Normal heart sounds. No murmur heard. No friction rub.  Pulmonary:     Effort: Pulmonary effort is normal. No respiratory distress.     Breath sounds: Normal breath sounds. No wheezing or rales.  Chest:     Chest wall: No tenderness.  Abdominal:     General: Bowel sounds are normal. There is no distension.     Palpations: Abdomen is soft. There is no mass.     Tenderness: There is no abdominal tenderness.  Musculoskeletal:        General: No tenderness. Normal range of motion.     Cervical back: Normal range of motion and neck supple.  Lymphadenopathy:     Cervical: No cervical adenopathy.  Skin:    General: Skin is warm and dry.     Findings: No erythema.     Comments: Hypopigmentation of skin  Neurological:     Mental Status: He is alert and oriented to person, place, and time.  Psychiatric:        Mood and Affect: Affect normal.     LABORATORY DATA:  I have reviewed the data as listed Lab Results  Component Value Date   WBC 8.3 06/18/2020   HGB 16.7 06/18/2020   HCT 50.5 06/18/2020   MCV 86.9 06/18/2020   PLT 164 06/18/2020   Recent Labs    09/17/19 1057 11/12/19 1128 03/14/20 1115 06/18/20 1349  NA 138 137 139 138  K 4.6 5.0 4.5 4.3  CL 108 106 106 105  CO2 _0 GLUCOSE 93 94 95 85  BUN _1 CREATININE 1.51* 1.90* 1.37* 1.45*  CALCIUM 8.6* 8.6* 8.6* 8.7*  GFRNONAA 44* 33* 49* 49*  GFRAA 51* 39*  --   --   PROT 7.5 8.0 8.0 7.5  ALBUMIN 3.4* 3.7 3.5 3.5  AST _2 ALT _3 ALKPHOS 55 69 62 65  BILITOT 0.5 0.5 0.6 0.4    SPEP: no M spike Serum free light chain Kappa/Lamda ratio 13.04 UPEP  Free light chain Kappa/lamda ratio 162.43, M spike 17m/24 hour   Bone marrow biopsy 03/21/2017  Bone Marrow, Aspirate,Biopsy, and Clot, left iliac - HYPERCELLULAR BONE MARROW FOR AGE WITH TRILINEAGE HEMATOPOIESIS. - PLASMACYTOSIS (PLASMA CELLS 8%)  Karyotype:  loss of Y chromosome Cytogenetic MDS FISH Panel negative.   Bone marrow 04/06/2017  Bone Marrow, Aspirate,Biopsy, and Clot, right iliac and core - HYPERCELLULAR BONE MARROW FOR AGE WITH PLASMA CELL NEOPLASM - TRILINEAGE HEMATOPOIESIS. - SEE COMMENT. PERIPHERAL BLOOD: - MICROCYTIC-HYPOCHROMIC ANEMIA. Diagnosis Note The bone marrow is hypercellular with trilineage hematopoiesis but with relative abundance of erythroid precursors and increased number of megakaryocytes with nonspecific changes. Significant dyspoiesis is not seen. Iron stores are present with no ring sideroblasts. The plasma cells are increased in number representing 8% of all  cells in the aspirate although focal areas in the core biopsy show 10 to 20% as primarily seen by CD138 stain. In situ hybridization for kappa and lambda light chains show kappa light chain restriction consistent with plasma cell neoplasm. Correlation with cytogenetic and FISH studies is recommended. (BNS:ecj/gt 04/07/2017)  IMAGE STUDIES I have personally reviewed below image results.  03/15/2017 DG bone survey Met: Nonspecific lucent lesion in the distal right ulna measuring 15-16 mm, but otherwise normal bone mineralization for age throughout the visible skeleton. A solitary lytic lesion in a distal extremity would be an unusual presentation of multiple myeloma, and I favor the distal right ulna lesion is benign. Recommend correlation with serum and urine protein electrophoresis.  04/14/2017 PET scan: 1. 2.1 cm hypermetabolic soft tissue lesion in the left heel, adjacent to the calcaneal tuberosity. 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 2. Mottled FDG accumulation diffusely in the marrow space without other frankly overt hypermetabolic bony lesion. 3. Coronary artery and thoracoabdominal aortic atherosclerosis with abdominal aortic stent graft visualized in situ. 4. Bilateral renal cysts and bilateral nonobstructing renal stones.  08/24/2017 PET  scan 1. No hypermetabolic osseous lesions. No definite signs of multiple myeloma on CT imaging. 2. Interval improvement in soft tissue activity along the medial aspect of the left calcaneal tuberosity, likely plantar fasciitis. 3. New small left pleural effusion. 4. Bilateral inguinal hernias containing small bowel on the right and sigmoid colon on the left. No evidence of incarceration or obstruction. 5. Otherwise stable incidental findings including diffuse atherosclerosis, bilateral renal cysts, emphysema and sigmoid diverticulosis.  ASSESSMENT & PLAN:  1. Iron deficiency anemia, unspecified iron deficiency anemia type   2. Multiple myeloma in remission ([place])   3. AVM (arteriovenous malformation)   4. History of prostate cancer    #Iron deficiency anemia secondary to chronic blood loss due to multiple AVM in small bowels. Status post enteroscopy at [facility] for AVM ablation. Labs are reviewed and discussed with patient. Proceed with IV venofer 263m x 1 today. He will get additional IV venofer weeklyx 2   #Smoldering multiple myeloma/active myeloma if previous hypermetabolic lesion on PET scan was related to myeloma. Patient was treated with 4 cycles of RVD.  He is not currently on any maintenance therapy due to multiple comorbidities, borderline smoldering multiple myeloma status.  Light chain ratio slightly increased. Will repeat myeloma panel every visit.   #History of prostate cancer, patient reports that he is not currently following up with anyone.  09/17/19 PSA 0.07. I will check PSA at next visit.   Follow-up in 4 months with reevaluation of blood work and MD reassessment.  Orders Placed This Encounter  Procedures  . CBC with Differential/Platelet    Standing Status:   Future    Standing Expiration Date:   06/20/2021  . Comprehensive metabolic panel    Standing Status:   Future    Standing Expiration Date:   06/20/2021  . Ferritin    Standing Status:   Future    Standing  Expiration Date:   06/20/2021  . Iron and TIBC    Standing Status:   Future    Standing Expiration Date:   06/20/2021  . Kappa/lambda light chains    Standing Status:   Future    Standing Expiration Date:   06/20/2021    ZEarlie Server MD, PhD Hematology Oncology CCarnegie Hill Endoscopyat ASt Anthony North Health CampusPager- [phone]/14/2022

## 2020-06-23 ENCOUNTER — Ambulatory Visit: Payer: Medicare PPO | Admitting: Podiatry

## 2020-06-25 ENCOUNTER — Other Ambulatory Visit: Payer: Self-pay

## 2020-06-25 ENCOUNTER — Inpatient Hospital Stay: Payer: Medicare PPO

## 2020-06-25 VITALS — BP 112/68 | HR 62 | Temp 96.7°F | Resp 18

## 2020-06-25 DIAGNOSIS — I129 Hypertensive chronic kidney disease with stage 1 through stage 4 chronic kidney disease, or unspecified chronic kidney disease: Secondary | ICD-10-CM | POA: Diagnosis not present

## 2020-06-25 DIAGNOSIS — D509 Iron deficiency anemia, unspecified: Secondary | ICD-10-CM

## 2020-06-25 MED ORDER — IRON SUCROSE 20 MG/ML IV SOLN
200.0000 mg | Freq: Once | INTRAVENOUS | Status: AC
  Administered 2020-06-25: 200 mg via INTRAVENOUS
  Filled 2020-06-25: qty 10

## 2020-06-25 MED ORDER — SODIUM CHLORIDE 0.9 % IV SOLN
Freq: Once | INTRAVENOUS | Status: AC
  Filled 2020-06-25: qty 250

## 2020-06-25 NOTE — Progress Notes (Signed)
Patient tolerated infusion well. Patient and VSS. Discharged home  

## 2020-06-26 ENCOUNTER — Other Ambulatory Visit: Payer: Self-pay

## 2020-06-26 ENCOUNTER — Encounter: Payer: Self-pay | Admitting: Podiatry

## 2020-06-26 ENCOUNTER — Ambulatory Visit: Payer: Medicare PPO | Admitting: Podiatry

## 2020-06-26 DIAGNOSIS — M2011 Hallux valgus (acquired), right foot: Secondary | ICD-10-CM

## 2020-06-26 DIAGNOSIS — I739 Peripheral vascular disease, unspecified: Secondary | ICD-10-CM | POA: Diagnosis not present

## 2020-06-26 DIAGNOSIS — E1140 Type 2 diabetes mellitus with diabetic neuropathy, unspecified: Secondary | ICD-10-CM

## 2020-06-26 DIAGNOSIS — D689 Coagulation defect, unspecified: Secondary | ICD-10-CM | POA: Diagnosis not present

## 2020-06-26 DIAGNOSIS — M2012 Hallux valgus (acquired), left foot: Secondary | ICD-10-CM

## 2020-06-26 DIAGNOSIS — B351 Tinea unguium: Secondary | ICD-10-CM

## 2020-06-26 DIAGNOSIS — M79609 Pain in unspecified limb: Secondary | ICD-10-CM

## 2020-06-26 NOTE — Progress Notes (Signed)
This patient returns to my office for at risk foot care.  This patient requires this care by a professional since this patient will be at risk due to having PAD and diabetes. And coagulation defect.  Patient is taking eliquiss.  This patient is unable to cut nails himself since the patient cannot reach his nails.These nails are painful walking and wearing shoes.  This patient presents for at risk foot care today.  General Appearance  Alert, conversant and in no acute stress.  Vascular  Dorsalis pedis and posterior tibial  pulses are not  palpable  bilaterally.  Capillary return is within normal limits  bilaterally. Temperature is within normal limits  bilaterally.  Neurologic  Senn-Weinstein monofilament wire test within normal limits  bilaterally. Muscle power within normal limits bilaterally.  Nails Thick disfigured discolored nails with subungual debris  from hallux to fifth toes bilaterally. No evidence of bacterial infection or drainage bilaterally.  Orthopedic  No limitations of motion  feet .  No crepitus or effusions noted.  No bony pathology or digital deformities noted.  HAV wuth hammer toe  B/L.  Rigid contracted second digit right foot.  Skin  normotropic skin with no porokeratosis noted bilaterally.  No signs of infections or ulcers noted.     Onychomycosis  Pain in right toes  Pain in left toes  Consent was obtained for treatment procedures.   Mechanical debridement of nails 1-5  bilaterally performed with a nail nipper.  Filed with dremel without incident.    Return office visit    10 weeks                 Told patient to return for periodic foot care and evaluation due to potential at risk complications.   Gardiner Barefoot DPM

## 2020-07-03 ENCOUNTER — Inpatient Hospital Stay: Payer: Medicare PPO

## 2020-08-28 ENCOUNTER — Encounter: Payer: Self-pay | Admitting: *Deleted

## 2020-09-04 ENCOUNTER — Ambulatory Visit: Payer: Medicare PPO | Admitting: Podiatry

## 2020-09-04 ENCOUNTER — Other Ambulatory Visit: Payer: Self-pay

## 2020-09-04 ENCOUNTER — Encounter: Payer: Self-pay | Admitting: Podiatry

## 2020-09-04 DIAGNOSIS — I739 Peripheral vascular disease, unspecified: Secondary | ICD-10-CM

## 2020-09-04 DIAGNOSIS — E1140 Type 2 diabetes mellitus with diabetic neuropathy, unspecified: Secondary | ICD-10-CM

## 2020-09-04 DIAGNOSIS — B351 Tinea unguium: Secondary | ICD-10-CM

## 2020-09-04 DIAGNOSIS — D689 Coagulation defect, unspecified: Secondary | ICD-10-CM

## 2020-09-04 DIAGNOSIS — M79609 Pain in unspecified limb: Secondary | ICD-10-CM

## 2020-09-04 NOTE — Progress Notes (Signed)
This patient returns to my office for at risk foot care.  This patient requires this care by a professional since this patient will be at risk due to having PAD and diabetes. And coagulation defect.  Patient is taking eliquiss.  This patient is unable to cut nails himself since the patient cannot reach his nails.These nails are painful walking and wearing shoes.  This patient presents for at risk foot care today.  General Appearance  Alert, conversant and in no acute stress.  Vascular  Dorsalis pedis and posterior tibial  pulses are not  palpable  bilaterally.  Capillary return is within normal limits  bilaterally. Temperature is within normal limits  bilaterally.  Neurologic  Senn-Weinstein monofilament wire test within normal limits  bilaterally. Muscle power within normal limits bilaterally.  Nails Thick disfigured discolored nails with subungual debris  from hallux to fifth toes bilaterally. No evidence of bacterial infection or drainage bilaterally.  Orthopedic  No limitations of motion  feet .  No crepitus or effusions noted.  No bony pathology or digital deformities noted.  HAV wuth hammer toe  B/L.  Rigid contracted second digit right foot.  Skin  normotropic skin with no porokeratosis noted bilaterally.  No signs of infections or ulcers noted.     Onychomycosis  Pain in right toes  Pain in left toes  Consent was obtained for treatment procedures.   Mechanical debridement of nails 1-5  bilaterally performed with a nail nipper.  Filed with dremel without incident.    Return office visit    10 weeks                 Told patient to return for periodic foot care and evaluation due to potential at risk complications.   Gardiner Barefoot DPM

## 2020-09-25 ENCOUNTER — Other Ambulatory Visit: Payer: Self-pay

## 2020-10-07 NOTE — Progress Notes (Signed)
10/08/2020  11:38 AM   Omar [name] Feb 14, 1941 [id number]  Referring provider: Jodi Marble, MD [place],  [place] [address] Chief Complaint  Patient presents with  . Nephrolithiasis    HPI: Omar [name] is a 80 y.o. male with a personal history of renal cysts, BPH with obstruction, CKD, and prostate cancer, who returns today for an annual follow up.  IPSS today was 7 PVR 10 mL  PSA as of 06/20/2020 is pending, and trend is below: 0.08 - 09/22/2018 0.07 - 09/17/2019 pending- 10/08/2020  Today he reports that he is still having midline low back pain.   He also noted that he saw some blood in his stool that seemed to resolve quickly.    IPSS    Row Name 10/08/20 1100         International Prostate Symptom Score   How often have you had the sensation of not emptying your bladder? More than half the time     How often have you had to urinate less than every two hours? Not at All     How often have you found you stopped and started again several times when you urinated? Less than 1 in 5 times     How often have you found it difficult to postpone urination? Not at All     How often have you had a weak urinary stream? Less than 1 in 5 times     How often have you had to strain to start urination? Not at All     How many times did you typically get up at night to urinate? 1 Time     Total IPSS Score 7           Quality of Life due to urinary symptoms   If you were to spend the rest of your life with your urinary condition just the way it is now how would you feel about that? Pleased            Score:  1-7 Mild 8-19 Moderate 20-35 Severe   Current everyday smoker. 1 pack per day for 60 years, currently at 0.5 packs per day.  PMH: Past Medical History:  Diagnosis Date  . Anxiety   . Arthritis    rheumatoid  . Atrial fibrillation (HCC)    hx of  . Cancer ([place])   . Chronic kidney disease   . Colon polyp 07-07-15   TUBULAR ADENOMA WITH AT  LEAST HIGH-GRADE / Dr Rayann Heman  . Diabetes mellitus without complication ([place])   . Dysrhythmia    bradycardia...Marland KitchenMarland Kitchen2 to 1 heart block  . GERD (gastroesophageal reflux disease)   . Heart murmur    patient unaware of history of murmur  . Hypercholesterolemia   . Hypertension   . Presence of permanent cardiac pacemaker   . Prostate cancer ([place]) 01/01/13, 01/30/14   Gleason 3+4=7, volume 46.6 cc  . Rheumatoid arthritis ([place])   . S/P radiation therapy  04/03/2014 through 06/04/2014                                                      Prostate 7800 cGy in 40 sessions                          .  Sleep apnea    uses cpap but it is not in the hospital with him    Surgical History: Past Surgical History:  Procedure Laterality Date  . ABDOMINAL AORTIC ANEURYSM REPAIR  11/2013  . COLON SURGERY  March 2017   Right hemicolectomy for tubulovillous adenoma with high-grade dysplasia.  . COLONOSCOPY WITH PROPOFOL N/A 07/07/2015   Procedure: COLONOSCOPY WITH PROPOFOL;  Surgeon: Josefine Class, MD;  Location: [facility] ENDOSCOPY;  Service: Endoscopy;  Laterality: N/A;  . COLONOSCOPY WITH PROPOFOL N/A 10/28/2016   Procedure: COLONOSCOPY WITH PROPOFOL;  Surgeon: [name], MD;  Location[facility]COPY;  Service: Endoscopy;  Laterality: N/A;  . ESOPHAGOGASTRODUODENOSCOPY (EGD) WITH PROPOFOL N/A 10/28/2016   Procedure: ESOPHAGOGASTRODUODENOSCOPY (EGD) WITH PROPOFOL;  Surgeon: [name], MD;  Location: [facility]COPY;  Service: Endoscopy;  Laterality: N/A;  . GIVENS CAPSULE STUDY N/A 07/25/2017   Procedure: GIVENS CAPSULE STUDY;  Surgeon: [name], MD;  Location: [facility] ENDOSCOPY;  Service: Gastroenterology;  Laterality: N/A;  . GIVENS CAPSULE STUDY N/A 01/29/2019   Procedure: GIVENS CAPSULE STUDY;  Surgeon: [name], MD;  Location: [facility] ENDOSCOPY;  Service: Gastroenterology;  Laterality: N/A;  . LAPAROSCOPIC RIGHT COLECTOMY Right 08/08/2015   Procedure: LAPAROSCOPIC RIGHT COLECTOMY;  Surgeon: Robert Bellow,  MD;  Location: [facility] ORS;  Service: General;  Laterality: Right;  . NASAL SINUS SURGERY    . PACEMAKER INSERTION Left 08/26/2016   Procedure: INSERTION PACEMAKER;  Surgeon: Isaias Cowman, MD;  Location: [facility] ORS;  Service: Cardiovascular;  Laterality: Left;  . PROSTATE BIOPSY  01/01/13, 01/30/14   Gleason 3+3=6, vol 46.6 cc  . TONSILLECTOMY    . uvula surgery     for sleep apnea    Home Medications:  Allergies as of 10/08/2020   No Known Allergies     Medication List       Accurate as of Oct 08, 2020 11:38 AM. If you have any questions, ask your nurse or doctor.        acetaminophen 325 MG tablet Commonly known as: TYLENOL Take 325 mg by mouth every 6 (six) hours as needed.   albuterol 108 (90 Base) MCG/ACT inhaler Commonly known as: VENTOLIN HFA   ALPRAZolam 0.25 MG tablet Commonly known as: XANAX Take by mouth 2 (two) times daily as needed.   amLODipine-benazepril 5-20 MG capsule Commonly known as: LOTREL Take 1 capsule by mouth daily.   apixaban 2.5 MG Tabs tablet Commonly known as: ELIQUIS Take 2.5 mg by mouth 2 (two) times daily.   atorvastatin 80 MG tablet Commonly known as: LIPITOR Take by mouth.   benazepril 20 MG tablet Commonly known as: LOTENSIN Take 20 mg by mouth daily.   Butalbital-APAP-Caffeine 50-300-40 MG Caps Take 1 capsule every four to six hours as needed   Cholecalciferol 1.25 MG (50000 UT) capsule Take by mouth.   colchicine 0.6 MG tablet Take by mouth.   ezetimibe 10 MG tablet Commonly known as: ZETIA Take 1 tablet by mouth 1 day or 1 dose.   ferrous sulfate 325 (65 FE) MG EC tablet Take 1 tablet (325 mg total) by mouth daily with breakfast.   fexofenadine 180 MG tablet Commonly known as: ALLEGRA Take by mouth.   fluticasone 50 MCG/ACT nasal spray Commonly known as: FLONASE Place 2 sprays into both nostrils daily as needed for rhinitis.   fluticasone furoate-vilanterol 100-25 MCG/INH Aepb Commonly known as: BREO  ELLIPTA Inhale 1 puff into the lungs daily.   furosemide 20 MG tablet Commonly known as: LASIX Take  20 mg by mouth daily.   gabapentin 400 MG capsule Commonly known as: NEURONTIN Take by mouth.   Humira Pen 40 MG/0.4ML Pnkt Generic drug: Adalimumab   hydroxychloroquine 200 MG tablet Commonly known as: PLAQUENIL Take by mouth.   isosorbide mononitrate 30 MG 24 hr tablet Commonly known as: IMDUR Take 30 mg by mouth daily.   Jardiance 25 MG Tabs tablet Generic drug: empagliflozin   loperamide 2 MG capsule Commonly known as: IMODIUM Take 1 capsule (2 mg total) by mouth See admin instructions. With onset of diarrhea, take 4mg  followed by 2mg  every 2 hours resolved. Maximum: 16 mg/day   metoprolol succinate 25 MG 24 hr tablet Commonly known as: TOPROL-XL Take 25 mg by mouth daily.   ondansetron 8 MG tablet Commonly known as: Zofran Take 1 tablet (8 mg total) 2 (two) times daily as needed by mouth (Nausea or vomiting).   OneTouch Ultra test strip Generic drug: glucose blood   pantoprazole 20 MG tablet Commonly known as: PROTONIX Take 20 mg by mouth daily.   tamsulosin 0.4 MG Caps capsule Commonly known as: FLOMAX Take 1 capsule (0.4 mg total) by mouth daily after supper.   tiZANidine 2 MG tablet Commonly known as: ZANAFLEX Take 2 mg by mouth 3 (three) times daily.   traMADol-acetaminophen 37.5-325 MG tablet Commonly known as: ULTRACET   triamcinolone cream 0.1 % Commonly known as: KENALOG Apply 1 application topically 2 (two) times daily.   UNABLE TO FIND       Allergies: No Known Allergies  Family History: Family History  Problem Relation Age of Onset  . Heart attack Mother   . Cirrhosis Father   . Pancreatic cancer Brother   . Diabetes Brother   . Diabetes Daughter        medication induced for cancer treatments  . Cancer Daughter        breast, brain    Social History:   reports that he has been smoking cigarettes. He has a 60.00 pack-year  smoking history. He has never used smokeless tobacco. He reports current alcohol use of about 1.0 standard drink of alcohol per week. He reports that he does not use drugs.  ROS: Pertinent ROS in HPI.  Physical Exam: BP 112/74   Pulse 81   Ht 5\' 11"  (1.803 m)   Wt 206 lb (93.4 kg)   BMI 28.73 kg/m   Constitutional:  Alert and oriented, No acute distress. HEENT: [place] AT, moist mucus membranes.  Trachea midline, no masses. Cardiovascular: No clubbing, cyanosis, or edema. Respiratory: Normal respiratory effort, no increased work of breathing. Skin: No rashes, bruises or suspicious lesions. Neurologic: Grossly intact, no focal deficits, moving all 4 extremities. Psychiatric: Normal mood and affect.  Laboratory Data:  Lab Results  Component Value Date   CREATININE 1.45 (H) 06/18/2020   I have reviewed the labs.  Pertinent Imaging:  Results for orders placed or performed in visit on 10/08/20  Bladder Scan (Post Void Residual) in office  Result Value Ref Range   Scan Result 10     Results for orders placed during the hospital encounter of 10/03/19  Abdomen 1 view (KUB)  Narrative CLINICAL DATA:  Kidney stones, pain  EXAM: ABDOMEN - 1 VIEW  COMPARISON:  08/23/2016  FINDINGS: Prior stent graft repair of AAA. No visible suspicious calcification. Nonobstructive bowel gas pattern. No organomegaly or free air.  IMPRESSION: No acute findings.   Electronically Signed By: Rolm Baptise M.D. On: 10/04/2019 08:11  I have personally reviewed the images and agree with radiologist interpretation.    Assessment & Plan:    1. History of prostate cancer PSA pending today  Status post IMRT in 2015.  2. BPH with obstruction Improved urinary symptoms with Flomax.  Continue Flomax and refilled today.  3. History of stones No stones on KUB last year.  Will follow up clinically as needed.   Follow Up:  RTC in a year, around 10/08/2021 with labs prior, SHIM and  IPSS.  Jaclyn Shaggy, am acting as a scribe for Dr. Hollice Espy.   I have reviewed the above documentation for accuracy and completeness, and I agree with the above.   Hollice Espy, MD     [facility] [address] [place], [place] [address] [phone]

## 2020-10-08 ENCOUNTER — Other Ambulatory Visit: Payer: Self-pay

## 2020-10-08 ENCOUNTER — Ambulatory Visit: Payer: Medicare PPO | Admitting: Urology

## 2020-10-08 ENCOUNTER — Encounter: Payer: Self-pay | Admitting: Urology

## 2020-10-08 VITALS — BP 112/74 | HR 81 | Ht 71.0 in | Wt 206.0 lb

## 2020-10-08 DIAGNOSIS — N200 Calculus of kidney: Secondary | ICD-10-CM

## 2020-10-08 DIAGNOSIS — N401 Enlarged prostate with lower urinary tract symptoms: Secondary | ICD-10-CM | POA: Diagnosis not present

## 2020-10-08 DIAGNOSIS — N138 Other obstructive and reflux uropathy: Secondary | ICD-10-CM | POA: Diagnosis not present

## 2020-10-08 LAB — BLADDER SCAN AMB NON-IMAGING: Scan Result: 10

## 2020-10-09 ENCOUNTER — Telehealth: Payer: Self-pay | Admitting: *Deleted

## 2020-10-09 LAB — PSA: Prostate Specific Ag, Serum: 0.1 ng/mL (ref 0.0–4.0)

## 2020-10-09 NOTE — Telephone Encounter (Addendum)
Patient notified, voiced understanding.  ----- Message from Hollice Espy, MD sent at 10/09/2020  8:17 AM EDT ----- PSA remains low at 0.1  Hollice Espy, MD

## 2020-10-17 ENCOUNTER — Other Ambulatory Visit: Payer: Self-pay

## 2020-10-17 ENCOUNTER — Inpatient Hospital Stay: Payer: Medicare PPO | Attending: Oncology

## 2020-10-17 DIAGNOSIS — D751 Secondary polycythemia: Secondary | ICD-10-CM | POA: Insufficient documentation

## 2020-10-17 DIAGNOSIS — R5383 Other fatigue: Secondary | ICD-10-CM | POA: Diagnosis not present

## 2020-10-17 DIAGNOSIS — I129 Hypertensive chronic kidney disease with stage 1 through stage 4 chronic kidney disease, or unspecified chronic kidney disease: Secondary | ICD-10-CM | POA: Diagnosis present

## 2020-10-17 DIAGNOSIS — D500 Iron deficiency anemia secondary to blood loss (chronic): Secondary | ICD-10-CM | POA: Insufficient documentation

## 2020-10-17 DIAGNOSIS — Z79899 Other long term (current) drug therapy: Secondary | ICD-10-CM | POA: Insufficient documentation

## 2020-10-17 DIAGNOSIS — N183 Chronic kidney disease, stage 3 unspecified: Secondary | ICD-10-CM | POA: Diagnosis present

## 2020-10-17 DIAGNOSIS — Q2730 Arteriovenous malformation, site unspecified: Secondary | ICD-10-CM

## 2020-10-17 DIAGNOSIS — Z7901 Long term (current) use of anticoagulants: Secondary | ICD-10-CM | POA: Insufficient documentation

## 2020-10-17 DIAGNOSIS — M069 Rheumatoid arthritis, unspecified: Secondary | ICD-10-CM | POA: Insufficient documentation

## 2020-10-17 DIAGNOSIS — G4730 Sleep apnea, unspecified: Secondary | ICD-10-CM | POA: Diagnosis not present

## 2020-10-17 DIAGNOSIS — D631 Anemia in chronic kidney disease: Secondary | ICD-10-CM | POA: Insufficient documentation

## 2020-10-17 DIAGNOSIS — F17210 Nicotine dependence, cigarettes, uncomplicated: Secondary | ICD-10-CM | POA: Insufficient documentation

## 2020-10-17 DIAGNOSIS — C9001 Multiple myeloma in remission: Secondary | ICD-10-CM

## 2020-10-17 DIAGNOSIS — Z8546 Personal history of malignant neoplasm of prostate: Secondary | ICD-10-CM

## 2020-10-17 DIAGNOSIS — D509 Iron deficiency anemia, unspecified: Secondary | ICD-10-CM

## 2020-10-17 LAB — COMPREHENSIVE METABOLIC PANEL
ALT: 24 U/L (ref 0–44)
AST: 24 U/L (ref 15–41)
Albumin: 3.8 g/dL (ref 3.5–5.0)
Alkaline Phosphatase: 67 U/L (ref 38–126)
Anion gap: 8 (ref 5–15)
BUN: 15 mg/dL (ref 8–23)
CO2: 24 mmol/L (ref 22–32)
Calcium: 8.8 mg/dL — ABNORMAL LOW (ref 8.9–10.3)
Chloride: 107 mmol/L (ref 98–111)
Creatinine, Ser: 1.63 mg/dL — ABNORMAL HIGH (ref 0.61–1.24)
GFR, Estimated: 43 mL/min — ABNORMAL LOW (ref >60)
Glucose, Bld: 141 mg/dL — ABNORMAL HIGH (ref 70–99)
Potassium: 4.2 mmol/L (ref 3.5–5.1)
Sodium: 139 mmol/L (ref 135–145)
Total Bilirubin: 0.7 mg/dL (ref 0.3–1.2)
Total Protein: 7.9 g/dL (ref 6.5–8.1)

## 2020-10-17 LAB — CBC WITH DIFFERENTIAL/PLATELET
Abs Immature Granulocytes: 0.03 10*3/uL (ref 0.00–0.07)
Basophils Absolute: 0 10*3/uL (ref 0.0–0.1)
Basophils Relative: 1 %
Eosinophils Absolute: 0.3 10*3/uL (ref 0.0–0.5)
Eosinophils Relative: 4 %
HCT: 58.4 % — ABNORMAL HIGH (ref 39.0–52.0)
Hemoglobin: 18.8 g/dL — ABNORMAL HIGH (ref 13.0–17.0)
Immature Granulocytes: 0 %
Lymphocytes Relative: 14 %
Lymphs Abs: 1.1 10*3/uL (ref 0.7–4.0)
MCH: 29.7 pg (ref 26.0–34.0)
MCHC: 32.2 g/dL (ref 30.0–36.0)
MCV: 92.3 fL (ref 80.0–100.0)
Monocytes Absolute: 0.4 10*3/uL (ref 0.1–1.0)
Monocytes Relative: 5 %
Neutro Abs: 5.9 10*3/uL (ref 1.7–7.7)
Neutrophils Relative %: 76 %
Platelets: 166 10*3/uL (ref 150–400)
RBC: 6.33 MIL/uL — ABNORMAL HIGH (ref 4.22–5.81)
RDW: 17.7 % — ABNORMAL HIGH (ref 11.5–15.5)
WBC: 7.7 10*3/uL (ref 4.0–10.5)
nRBC: 0 % (ref 0.0–0.2)

## 2020-10-17 LAB — IRON AND TIBC
Iron: 57 ug/dL (ref 45–182)
Saturation Ratios: 18 % (ref 17.9–39.5)
TIBC: 321 ug/dL (ref 250–450)
UIBC: 264 ug/dL

## 2020-10-17 LAB — FERRITIN: Ferritin: 37 ng/mL (ref 24–336)

## 2020-10-17 LAB — PSA: Prostatic Specific Antigen: 0.05 ng/mL (ref 0.00–4.00)

## 2020-10-20 ENCOUNTER — Ambulatory Visit: Payer: Medicare PPO

## 2020-10-20 ENCOUNTER — Ambulatory Visit: Payer: Medicare PPO | Admitting: Oncology

## 2020-10-20 LAB — KAPPA/LAMBDA LIGHT CHAINS
Kappa free light chain: 102.8 mg/L — ABNORMAL HIGH (ref 3.3–19.4)
Kappa, lambda light chain ratio: 4.9 — ABNORMAL HIGH (ref 0.26–1.65)
Lambda free light chains: 21 mg/L (ref 5.7–26.3)

## 2020-10-22 LAB — MULTIPLE MYELOMA PANEL, SERUM
Albumin SerPl Elph-Mcnc: 3.5 g/dL (ref 2.9–4.4)
Albumin/Glob SerPl: 1.1 (ref 0.7–1.7)
Alpha 1: 0.2 g/dL (ref 0.0–0.4)
Alpha2 Glob SerPl Elph-Mcnc: 0.6 g/dL (ref 0.4–1.0)
B-Globulin SerPl Elph-Mcnc: 1 g/dL (ref 0.7–1.3)
Gamma Glob SerPl Elph-Mcnc: 1.7 g/dL (ref 0.4–1.8)
Globulin, Total: 3.5 g/dL (ref 2.2–3.9)
IgA: 233 mg/dL (ref 61–437)
IgG (Immunoglobin G), Serum: 1848 mg/dL — ABNORMAL HIGH (ref 603–1613)
IgM (Immunoglobulin M), Srm: 88 mg/dL (ref 15–143)
Total Protein ELP: 7 g/dL (ref 6.0–8.5)

## 2020-10-24 ENCOUNTER — Inpatient Hospital Stay: Payer: Medicare PPO

## 2020-10-24 ENCOUNTER — Inpatient Hospital Stay (HOSPITAL_BASED_OUTPATIENT_CLINIC_OR_DEPARTMENT_OTHER): Payer: Medicare PPO | Admitting: Oncology

## 2020-10-24 VITALS — BP 91/61 | HR 69 | Temp 97.6°F | Resp 18 | Wt 210.7 lb

## 2020-10-24 DIAGNOSIS — C9001 Multiple myeloma in remission: Secondary | ICD-10-CM

## 2020-10-24 DIAGNOSIS — D751 Secondary polycythemia: Secondary | ICD-10-CM

## 2020-10-24 DIAGNOSIS — Z8546 Personal history of malignant neoplasm of prostate: Secondary | ICD-10-CM | POA: Diagnosis not present

## 2020-10-24 DIAGNOSIS — D509 Iron deficiency anemia, unspecified: Secondary | ICD-10-CM | POA: Diagnosis not present

## 2020-10-24 DIAGNOSIS — I129 Hypertensive chronic kidney disease with stage 1 through stage 4 chronic kidney disease, or unspecified chronic kidney disease: Secondary | ICD-10-CM | POA: Diagnosis not present

## 2020-10-24 NOTE — Patient Instructions (Addendum)
[place] [facility]  Discharge Instructions: Thank you for choosing [facility] to provide your oncology and hematology care.  If you have a lab appointment with the [facility], please go directly to the [facility] and check in at the registration area.  Wear comfortable clothing and clothing appropriate for easy access to any Portacath or PICC line.   We strive to give you quality time with your provider. You may need to reschedule your appointment if you arrive late (15 or more minutes).  Arriving late affects you and other patients whose appointments are after yours.  Also, if you miss three or more appointments without notifying the office, you may be dismissed from the clinic at the provider's discretion.      For prescription refill requests, have your pharmacy contact our office and allow 72 hours for refills to be completed.    Today you received the following : Phlebotomy   To help prevent nausea and vomiting after your treatment, we encourage you to take your nausea medication as directed.  BELOW ARE SYMPTOMS THAT SHOULD BE REPORTED IMMEDIATELY: . *FEVER GREATER THAN 100.4 F (38 C) OR HIGHER . *CHILLS OR SWEATING . *NAUSEA AND VOMITING THAT IS NOT CONTROLLED WITH YOUR NAUSEA MEDICATION . *UNUSUAL SHORTNESS OF BREATH . *UNUSUAL BRUISING OR BLEEDING . *URINARY PROBLEMS (pain or burning when urinating, or frequent urination) . *BOWEL PROBLEMS (unusual diarrhea, constipation, pain near the anus) . TENDERNESS IN MOUTH AND THROAT WITH OR WITHOUT PRESENCE OF ULCERS (sore throat, sores in mouth, or a toothache) . UNUSUAL RASH, SWELLING OR PAIN  . UNUSUAL VAGINAL DISCHARGE OR ITCHING   Items with * indicate a potential emergency and should be followed up as soon as possible or go to the Emergency Department if any problems should occur.  Please show the CHEMOTHERAPY ALERT CARD or IMMUNOTHERAPY ALERT CARD at check-in to the Emergency  Department and triage nurse.  Should you have questions after your visit or need to cancel or reschedule your appointment, please contact [facility]  [phone] and follow the prompts.  Office hours are 8:00 a.m. to 4:30 p.m. Monday - Friday. Please note that voicemails left after 4:00 p.m. may not be returned until the following business day.  We are closed weekends and major holidays. You have access to a nurse at all times for urgent questions. Please call the main number to the clinic [phone] and follow the prompts.  For any non-urgent questions, you may also contact your provider using MyChart. We now offer e-Visits for anyone 21 and older to request care online for non-urgent symptoms. For details visit [email or web address].   Also download the MyChart app! Go to the app store, search "MyChart", open the app, select [facility], and log in with your MyChart username and password.  Due to Covid, a mask is required upon entering the hospital/clinic. If you do not have a mask, one will be given to youTherapeutic Phlebotomy Discharge Instructions  - Increase your fluid intake over the next 4 hours  - No smoking for 30 minutes  - Avoid using the affected arm (the one you had the blood drawn from) for heavy lifting or other activities.  - You may resume all normal activities after 30 minutes.  You are to notify the office if you experience:   - Persistent dizziness and/or lightheadedness -Uncontrolled or excessive bleeding at the site.   upon arrival. For doctor visits, patients may have 1  support person aged 56 or older with them. For treatment visits, patients cannot have anyone with them due to current Covid guidelines and our immunocompromised population.

## 2020-10-24 NOTE — Progress Notes (Signed)
Pt here for follow up. pt reports that stools are dark, he is taking iron supplements which he knows may turn stools dark , but just wanted to make MD aware.

## 2020-10-24 NOTE — Progress Notes (Signed)
Per MD- no venofer today. Pt needs phleb of 300 ml. Pt tolerated without incidence and discharged stable.

## 2020-10-25 ENCOUNTER — Encounter: Payer: Self-pay | Admitting: Oncology

## 2020-10-25 NOTE — Progress Notes (Signed)
Hematology/Oncology Follow Up Note [facility] [phone] [phone]) [phone]  Patient Care Team: Jodi Marble, MD as PCP - General (Internal Medicine) Bary Castilla, Forest Gleason, MD (General Surgery) Edrick Kins, MD as Rounding Team (Internal Medicine) Hillary Bow, MD (Inactive) as Consulting Physician (Internal Medicine) [name], MD as Surgeon (Gastroenterology) Isaias Cowman, MD as Consulting Physician (Cardiology) Earlie Server, MD as Consulting Physician (Oncology)  REFERRING PROVIDER: Dr.Lateef, [place]  REASON FOR VISIT Follow up for management of plasma cell dyscrasia.  And iron deficiency anemia.   HISTORY OF PRESENTING ILLNESS:  80 y.o.  male with PMH listed below who presents to follow up on the evaluation and management of his abnormal urine protein electrophoresis results. I reviewed the records from [facility] [place], [place] and [place] was performed and HemOnc related medical problems are listed below.  He lives with a friend. He has 3 adult children.   1 Chronic Kidney disease, Stage III: patient follows up with Dr.Lateef.  2 Proteinuria:  02/18/2017: Albumin/Creatinine ratio was 7, Urine protein electrophoresis,Random Urine revealed M Spike 43.2%, total protein of 43.39m/dl,  01/05/2017 Albumin/Creatinine ratio was 455.2, Albumin 996.5, , 2.15.2018 Albumin/Creatinine ratio was 80.3, Albumin 73.3, Urine protein electrophoresis,Random Urine revealed M Spike 29.7%, total protein of 29.124mdl, 06/30/2016 Serum protein electrophoresis did not detect M spike.  Autoimmune disorder work up showed Negative anti dsDNA, RNP antibodies, smith antibody, Sjogren antibodies, anti Jo-1, positive antichromotin antibodies, positive ANA,  3 Anemia of chronic kidney disease:  4 Iron deficiency anemia: history of iron deficiency, ferritin was 7 on 08/11/2016. He had EGD and colonoscopy that were done  this year which showed duodenitis/esphagitis/diverticulosis/benigh polyps. # [facility]  5 Rheumatoid arthritis: he follows up with Dr.Kernodle and is on chronic MTX. He Is currently off MTX reports joint pains are controlled, not quite symptomatic.   Patient denies any persistent bone pain or any pain. He continue to feel lack of energy, persistent, not improved with resting or taking naps. He also has lost 25 pounds in the past year which was unintentional.   # Rheumatoid arthritis and currently is off MTX.  # It is ambiguous whether he has truly symptomatic multiple myeloma or a smoldering myeloma, given that anemia can be secondary to iron deficiency and CKD.  The hypermetabolic 2.1 cm hypermetabolic soft tissue lesion in the left heel, can reflect a Plasmacytoma,which should have been biopsied. However patient initially denies being on any blood thinner, later he was found to be on Plavix with pre biopsy questionnaire screening, and biopsy was held. Patient has to contact cardiologist to hold plavix for seven days before procedure. He feels frustrated about multiple workup and meanwhile he becomes more fatigues, with worsening of kidney function. Patient was reluctant about proceeding additional invasive procedures and wants to start treatment. Decision was made to start active MM treatment.   # [facility]. He is not considered to  good candidate for transplant. Patient cardiologist Dr. KaChancy Milroyad started patient on Lasix 20 mg daily. [facility] as patient was having dizziness and borderline blood pressure during his clinic visit there.  10/02/2018 He had follow-up left foot/ankle x-ray as a follow-up of his left heel lesion which was initially presented on PET scan and not detectable on follow-up PET scan.  Images were reviewed by me.  No  suspicious acute or subacute osseous abnormality  Current Treatment:  S/p RVD x 4 cycles tolerates well except cytopenia.  Revlimid 10 mg PO once per day on days 1 to 14 (dose reduced due to GFR) Revlimid renal dosing of 12m,  Velcade 1.3 mg/m2 IV once per day on days 1,  8, 15 Dexamethasone 20 mg PO once per day on days 1, 8, 15 given patient's diabetes Given patient's multiple comorbidity and the side effects of cytopenia during his RVD treatment and the uncertainty of this is really a site of plasmacytoma or not, I recommend not to continue him on maintenance Revlimid.   # repeat small bowel capsule study on 02/16/2019 Which showed multiple AVMs seen in the mid jejunum proximal to the site of prior tattoo. # [facility] 06/19/2019 double-balloon enteroscopy with findings of 5 AVMs in the jejunum and 2 AVMs in the duodenum and AVMs were ablated with APC.  Tattoo was seen in the area. If further bleeding, recommend to consider treatment with octreotide. Patient takes Protonix daily.    INTERVAL HISTORY Patient presents for follow-up of management of multiple myeloma/smoldering multiple myeloma and iron deficiency anemia. Patient mentioned that his stool is dark while on oral iron supplementation.  No bright red blood in stool. No new complaints. Sleep apnea, patient has not used his CPAP machine for about a year.  Review of Systems  Constitutional: Negative for appetite change, chills, fatigue, fever and unexpected weight change.  HENT:   Negative for hearing loss and voice change.   Eyes: Negative for eye problems and icterus.  Respiratory: Negative for chest tightness, cough and shortness of breath.   Cardiovascular: Negative for chest pain and leg swelling.  Gastrointestinal: Negative for abdominal distention, abdominal pain and diarrhea.  Endocrine: Negative for hot flashes.  Genitourinary: Negative for difficulty urinating, dysuria and  frequency.   Musculoskeletal: Positive for arthralgias.  Skin: Negative for itching and rash.  Neurological: Negative for light-headedness and numbness.  Hematological: Negative for adenopathy. Does not bruise/bleed easily.  Psychiatric/Behavioral: Negative for confusion.     MEDICAL HISTORY:  Past Medical History:  Diagnosis Date  . Anxiety   . Arthritis    rheumatoid  . Atrial fibrillation (HCC)    hx of  . Cancer ([place]   . Chronic kidney disease   . Colon polyp 07-07-15   TUBULAR ADENOMA WITH AT LEAST HIGH-GRADE / Dr RRayann Heman . Diabetes mellitus without complication ([facility]   . Dysrhythmia    bradycardia....Marland KitchenMarland Kitchen to 1 heart block  . GERD (gastroesophageal reflux disease)   . Heart murmur    patient unaware of history of murmur  . Hypercholesterolemia   . Hypertension   . Presence of permanent cardiac pacemaker   . Prostate cancer ([place] 01/01/13, 01/30/14   Gleason 3+4=7, volume 46.6 cc  . Rheumatoid arthritis ([facility]   . S/P radiation therapy  04/03/2014 through 06/04/2014                                                      Prostate 7800 cGy in 40 sessions                          . Sleep apnea    uses cpap but it is  not in the hospital with him    SURGICAL HISTORY: Past Surgical History:  Procedure Laterality Date  . ABDOMINAL AORTIC ANEURYSM REPAIR  11/2013  . COLON SURGERY  March 2017   Right hemicolectomy for tubulovillous adenoma with high-grade dysplasia.  . COLONOSCOPY WITH PROPOFOL N/A 07/07/2015   Procedure: COLONOSCOPY WITH PROPOFOL;  Surgeon: Josefine Class, MD;  Location[facility] ENDOSCOPY;  Service: Endoscopy;  Laterality: N/A;  . COLONOSCOPY WITH PROPOFOL N/A 10/28/2016   Procedure: COLONOSCOPY WITH PROPOFOL;  Surgeon: [name], MD;  Location[facility] ENDOSCOPY;  Service: Endoscopy;  Laterality: N/A;  . ESOPHAGOGASTRODUODENOSCOPY (EGD) WITH PROPOFOL N/A 10/28/2016   Procedure: ESOPHAGOGASTRODUODENOSCOPY (EGD) WITH PROPOFOL;  Surgeon: [name], MD;  Location:  [facility] ENDOSCOPY;  Service: Endoscopy;  Laterality: N/A;  . GIVENS CAPSULE STUDY N/A 07/25/2017   Procedure: GIVENS CAPSULE STUDY;  Surgeon: [name], MD;  Location: [facility] ENDOSCOPY;  Service: Gastroenterology;  Laterality: N/A;  . GIVENS CAPSULE STUDY N/A 01/29/2019   Procedure: GIVENS CAPSULE STUDY;  Surgeon: [name], MD;  Location: [facility] ENDOSCOPY;  Service: Gastroenterology;  Laterality: N/A;  . LAPAROSCOPIC RIGHT COLECTOMY Right 08/08/2015   Procedure: LAPAROSCOPIC RIGHT COLECTOMY;  Surgeon: Robert Bellow, MD;  Location: [facility] ORS;  Service: General;  Laterality: Right;  . NASAL SINUS SURGERY    . PACEMAKER INSERTION Left 08/26/2016   Procedure: INSERTION PACEMAKER;  Surgeon: Isaias Cowman, MD;  Location: [facility] ORS;  Service: Cardiovascular;  Laterality: Left;  . PROSTATE BIOPSY  01/01/13, 01/30/14   Gleason 3+3=6, vol 46.6 cc  . TONSILLECTOMY    . uvula surgery     for sleep apnea    SOCIAL HISTORY: Social History   Socioeconomic History  . Marital status: Divorced    Spouse name: Not on file  . Number of children: Not on file  . Years of education: Not on file  . Highest education level: Not on file  Occupational History  . Not on file  Tobacco Use  . Smoking status: Current Every Day Smoker    Packs/day: 1.00    Years: 60.00    Pack years: 60.00    Types: Cigarettes  . Smokeless tobacco: Never Used  . Tobacco comment: DOWN TO 1/2 PPD  Vaping Use  . Vaping Use: Never used  Substance and Sexual Activity  . Alcohol use: Yes    Alcohol/week: 1.0 standard drink    Types: 1 Cans of beer per week    Comment: occasionally  . Drug use: No  . Sexual activity: Not Currently  Other Topics Concern  . Not on file  Social History Narrative  . Not on file   Social Determinants of Health   Financial Resource Strain: Not on file  Food Insecurity: Not on file  Transportation Needs: Not on file  Physical Activity: Not on file  Stress: Not on file  Social Connections:  Not on file  Intimate Partner Violence: Not on file    FAMILY HISTORY: Family History  Problem Relation Age of Onset  . Heart attack Mother   . Cirrhosis Father   . Pancreatic cancer Brother   . Diabetes Brother   . Diabetes Daughter        medication induced for cancer treatments  . Cancer Daughter        breast, brain    ALLERGIES:  has No Known Allergies.  MEDICATIONS:  Current Outpatient Medications  Medication Sig Dispense Refill  . acetaminophen (TYLENOL) 325 MG tablet Take 325 mg by mouth every 6 (  six) hours as needed.    Marland Kitchen albuterol (PROVENTIL HFA;VENTOLIN HFA) 108 (90 Base) MCG/ACT inhaler     . ALPRAZolam (XANAX) 0.25 MG tablet Take by mouth 2 (two) times daily as needed.     Marland Kitchen amLODipine-benazepril (LOTREL) 5-20 MG capsule Take 1 capsule by mouth daily.     Marland Kitchen apixaban (ELIQUIS) 2.5 MG TABS tablet Take 2.5 mg by mouth 2 (two) times daily.     Marland Kitchen atorvastatin (LIPITOR) 80 MG tablet Take by mouth.    . Butalbital-APAP-Caffeine 50-300-40 MG CAPS Take 1 capsule every four to six hours as needed    . Cholecalciferol 1.25 MG (50000 UT) capsule Take by mouth.     . colchicine 0.6 MG tablet Take by mouth.    . ezetimibe (ZETIA) 10 MG tablet Take 1 tablet by mouth 1 day or 1 dose.     . ferrous sulfate 325 (65 FE) MG EC tablet Take 1 tablet (325 mg total) by mouth daily with breakfast. 90 tablet 1  . fexofenadine (ALLEGRA) 180 MG tablet Take by mouth.    . fluticasone (FLONASE) 50 MCG/ACT nasal spray Place 2 sprays into both nostrils daily as needed for rhinitis.     . fluticasone furoate-vilanterol (BREO ELLIPTA) 100-25 MCG/INH AEPB Inhale 1 puff into the lungs daily.     . furosemide (LASIX) 20 MG tablet Take 20 mg by mouth daily.    Marland Kitchen gabapentin (NEURONTIN) 400 MG capsule Take by mouth.    Marland Kitchen glucose blood (ONETOUCH ULTRA) test strip     . HUMIRA PEN 40 MG/0.4ML PNKT     . hydroxychloroquine (PLAQUENIL) 200 MG tablet Take by mouth.    . isosorbide mononitrate (IMDUR) 30 MG  24 hr tablet Take 30 mg by mouth daily.    Marland Kitchen JARDIANCE 25 MG TABS tablet     . metoprolol succinate (TOPROL-XL) 25 MG 24 hr tablet Take 25 mg by mouth daily.     . pantoprazole (PROTONIX) 20 MG tablet Take 20 mg by mouth daily.    . tamsulosin (FLOMAX) 0.4 MG CAPS capsule Take 1 capsule (0.4 mg total) by mouth daily after supper. 90 capsule 3  . tiZANidine (ZANAFLEX) 2 MG tablet Take 2 mg by mouth 3 (three) times daily.    . traMADol-acetaminophen (ULTRACET) 37.5-325 MG tablet     . triamcinolone cream (KENALOG) 0.1 % Apply 1 application topically 2 (two) times daily.    . benazepril (LOTENSIN) 20 MG tablet Take 20 mg by mouth daily.    Marland Kitchen loperamide (IMODIUM) 2 MG capsule Take 1 capsule (2 mg total) by mouth See admin instructions. With onset of diarrhea, take 93m followed by 270mevery 2 hours resolved. Maximum: 16 mg/day (Patient not taking: Reported on 10/24/2020) 120 capsule 0  . ondansetron (ZOFRAN) 8 MG tablet Take 1 tablet (8 mg total) 2 (two) times daily as needed by mouth (Nausea or vomiting). (Patient not taking: Reported on 10/24/2020) 30 tablet 1   No current facility-administered medications for this visit.      . Marland KitchenPHYSICAL EXAMINATION: ECOG PERFORMANCE STATUS: 1 - Symptomatic but completely ambulatory Vitals:   10/24/20 1309  BP: 91/61  Pulse: 69  Resp: 18  Temp: 97.6 F (36.4 C)   Filed Weights   10/24/20 1309  Weight: 210 lb 11.2 oz (95.6 kg)   Physical Exam Constitutional:      General: He is not in acute distress.    Appearance: He is not diaphoretic.  HENT:  Head: Normocephalic and atraumatic.     Nose: Nose normal.     Mouth/Throat:     Pharynx: No oropharyngeal exudate.  Eyes:     General: No scleral icterus.       Right eye: No discharge.        Left eye: No discharge.     Pupils: Pupils are equal, round, and reactive to light.  Neck:     Vascular: No JVD.  Cardiovascular:     Rate and Rhythm: Normal rate and regular rhythm.     Heart sounds:  Normal heart sounds. No murmur heard. No friction rub.  Pulmonary:     Effort: Pulmonary effort is normal. No respiratory distress.     Breath sounds: Normal breath sounds. No wheezing or rales.  Chest:     Chest wall: No tenderness.  Abdominal:     General: Bowel sounds are normal. There is no distension.     Palpations: Abdomen is soft. There is no mass.     Tenderness: There is no abdominal tenderness.  Musculoskeletal:        General: No tenderness. Normal range of motion.     Cervical back: Normal range of motion and neck supple.  Lymphadenopathy:     Cervical: No cervical adenopathy.  Skin:    General: Skin is warm and dry.     Findings: No erythema.     Comments: Hypopigmentation of skin  Neurological:     Mental Status: He is alert and oriented to person, place, and time.  Psychiatric:        Mood and Affect: Affect normal.     LABORATORY DATA:  I have reviewed the data as listed Lab Results  Component Value Date   WBC 7.7 10/17/2020   HGB 18.8 (H) 10/17/2020   HCT 58.4 (H) 10/17/2020   MCV 92.3 10/17/2020   PLT 166 10/17/2020   Recent Labs    11/12/19 1128 03/14/20 1115 06/18/20 1349 10/17/20 1242  NA 137 139 138 139  K 5.0 4.5 4.3 4.2  CL 106 106 105 107  CO2 _0 GLUCOSE 94 95 85 141*  BUN _1 CREATININE 1.90* 1.37* 1.45* 1.63*  CALCIUM 8.6* 8.6* 8.7* 8.8*  GFRNONAA 33* 49* 49* 43*  GFRAA 39*  --   --   --   PROT 8.0 8.0 7.5 7.9  ALBUMIN 3.7 3.5 3.5 3.8  AST _2 ALT _3 ALKPHOS 69 62 65 67  BILITOT 0.5 0.6 0.4 0.7    SPEP: no M spike Serum free light chain Kappa/Lamda ratio 13.04 UPEP  Free light chain Kappa/lamda ratio 162.43, M spike 143m/24 hour   Bone marrow biopsy 03/21/2017  Bone Marrow, Aspirate,Biopsy, and Clot, left iliac - HYPERCELLULAR BONE MARROW FOR AGE WITH TRILINEAGE HEMATOPOIESIS. - PLASMACYTOSIS (PLASMA CELLS 8%)  Karyotype: loss of Y chromosome Cytogenetic MDS FISH Panel negative.    Bone marrow 04/06/2017  Bone Marrow, Aspirate,Biopsy, and Clot, right iliac and core - HYPERCELLULAR BONE MARROW FOR AGE WITH PLASMA CELL NEOPLASM - TRILINEAGE HEMATOPOIESIS. - SEE COMMENT. PERIPHERAL BLOOD: - MICROCYTIC-HYPOCHROMIC ANEMIA. Diagnosis Note The bone marrow is hypercellular with trilineage hematopoiesis but with relative abundance of erythroid precursors and increased number of megakaryocytes with nonspecific changes. Significant dyspoiesis is not seen. Iron stores are present with no ring sideroblasts. The plasma cells are increased in number representing 8% of all cells in the aspirate although focal areas  in the core biopsy show 10 to 20% as primarily seen by CD138 stain. In situ hybridization for kappa and lambda light chains show kappa light chain restriction consistent with plasma cell neoplasm. Correlation with cytogenetic and FISH studies is recommended. (BNS:ecj/gt 04/07/2017)  IMAGE STUDIES I have personally reviewed below image results.  03/15/2017 DG bone survey Met: Nonspecific lucent lesion in the distal right ulna measuring 15-16 mm, but otherwise normal bone mineralization for age throughout the visible skeleton. A solitary lytic lesion in a distal extremity would be an unusual presentation of multiple myeloma, and I favor the distal right ulna lesion is benign. Recommend correlation with serum and urine protein electrophoresis.  04/14/2017 PET scan: 1. 2.1 cm hypermetabolic soft tissue lesion in the left heel, adjacent to the calcaneal tuberosity. 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 2. Mottled FDG accumulation diffusely in the marrow space without other frankly overt hypermetabolic bony lesion. 3. Coronary artery and thoracoabdominal aortic atherosclerosis with abdominal aortic stent graft visualized in situ. 4. Bilateral renal cysts and bilateral nonobstructing renal stones.  08/24/2017 PET scan 1. No hypermetabolic osseous lesions. No definite signs  of multiple myeloma on CT imaging. 2. Interval improvement in soft tissue activity along the medial aspect of the left calcaneal tuberosity, likely plantar fasciitis. 3. New small left pleural effusion. 4. Bilateral inguinal hernias containing small bowel on the right and sigmoid colon on the left. No evidence of incarceration or obstruction. 5. Otherwise stable incidental findings including diffuse atherosclerosis, bilateral renal cysts, emphysema and sigmoid diverticulosis.  ASSESSMENT & PLAN:  1. Iron deficiency anemia, unspecified iron deficiency anemia type   2. Multiple myeloma in remission ([place])   3. History of prostate cancer   4. Erythrocytosis    #Iron deficiency anemia secondary to chronic blood loss due to multiple AVM in small bowels. Status post enteroscopy at [facility] for AVM ablation. Labs reviewed and discussed with patient. No anemia. Hemoglobin is high at 18.8, hematocrit 58.4.  No need for IV Venofer treatments. I recommend patient to stop oral iron supplementation.  #Erythrocytosis likely secondary, due to noncompliance for sleep apnea treatment. Discussed with the patient and explained rationale of CPAP machine and why not using the machine may have contributed to the elevated hemoglobin.  Patient is motivated. Proceed with phlebotomy 300 cc x 1 today. Repeat H&H in 2 weeks in 4 weeks +/- phlebotomy.  Anticipate that he may not need additional phlebotomy if he restarts to use CPAP.  #Smoldering multiple myeloma/active myeloma if previous hypermetabolic lesion on PET scan was related to myeloma. Patient was treated with 4 cycles of RVD.  He is not currently on any maintenance therapy due to multiple comorbidities, borderline smoldering multiple myeloma status.  Light chain ratio slightly increased.  We will check 24-hour urine electrophoresis and immunofixation.  #History of prostate cancer, patient reports that he is not currently following up with anyone.  10/17/2020,  PSA point 0.05  Follow-up in 2 months with reevaluation of blood work and MD reassessment.  Orders Placed This Encounter  Procedures  . 24 hr Ur UPEP/UIFE/Light Chains/TP    Standing Status:   Future    Standing Expiration Date:   10/24/2021  . CBC with Differential/Platelet    Standing Status:   Future    Standing Expiration Date:   10/24/2021  . Iron and TIBC    Standing Status:   Future    Standing Expiration Date:   10/24/2021  . Ferritin    Standing Status:   Future  Standing Expiration Date:   10/24/2021    Earlie Server, MD, PhD Hematology Oncology [facility] at [facility] Pager- [phone] 10/25/2020

## 2020-10-27 ENCOUNTER — Other Ambulatory Visit: Payer: Self-pay

## 2020-10-28 DIAGNOSIS — I129 Hypertensive chronic kidney disease with stage 1 through stage 4 chronic kidney disease, or unspecified chronic kidney disease: Secondary | ICD-10-CM | POA: Diagnosis not present

## 2020-10-29 ENCOUNTER — Other Ambulatory Visit: Payer: Self-pay

## 2020-10-29 DIAGNOSIS — D509 Iron deficiency anemia, unspecified: Secondary | ICD-10-CM

## 2020-10-31 LAB — UPEP/UIFE/LIGHT CHAINS/TP, 24-HR UR
% BETA, Urine: 48.6 %
ALPHA 1 URINE: 9.9 %
Albumin, U: 17.8 %
Alpha 2, Urine: 9.7 %
Free Kappa Lt Chains,Ur: 156.37 mg/L — ABNORMAL HIGH (ref 1.17–86.46)
Free Kappa/Lambda Ratio: 15.94 — ABNORMAL HIGH (ref 1.83–14.26)
Free Lambda Lt Chains,Ur: 9.81 mg/L (ref 0.27–15.21)
GAMMA GLOBULIN URINE: 14.1 %
M-SPIKE %, Urine: 26.2 % — ABNORMAL HIGH
M-Spike, Mg/24 Hr: 27 mg/24 hr — ABNORMAL HIGH
Total Protein, Urine-Ur/day: 104 mg/24 hr (ref 30–150)
Total Protein, Urine: 8.3 mg/dL
Total Volume: 1250

## 2020-11-06 ENCOUNTER — Other Ambulatory Visit: Payer: Self-pay

## 2020-11-06 DIAGNOSIS — D751 Secondary polycythemia: Secondary | ICD-10-CM

## 2020-11-07 ENCOUNTER — Inpatient Hospital Stay: Payer: Medicare PPO

## 2020-11-07 ENCOUNTER — Inpatient Hospital Stay: Payer: Medicare PPO | Attending: Oncology

## 2020-11-07 VITALS — BP 136/78 | HR 92 | Temp 97.6°F | Resp 18

## 2020-11-07 DIAGNOSIS — Z7901 Long term (current) use of anticoagulants: Secondary | ICD-10-CM | POA: Diagnosis not present

## 2020-11-07 DIAGNOSIS — D631 Anemia in chronic kidney disease: Secondary | ICD-10-CM | POA: Diagnosis not present

## 2020-11-07 DIAGNOSIS — D500 Iron deficiency anemia secondary to blood loss (chronic): Secondary | ICD-10-CM | POA: Diagnosis not present

## 2020-11-07 DIAGNOSIS — R5383 Other fatigue: Secondary | ICD-10-CM | POA: Insufficient documentation

## 2020-11-07 DIAGNOSIS — M069 Rheumatoid arthritis, unspecified: Secondary | ICD-10-CM | POA: Insufficient documentation

## 2020-11-07 DIAGNOSIS — Z8546 Personal history of malignant neoplasm of prostate: Secondary | ICD-10-CM | POA: Insufficient documentation

## 2020-11-07 DIAGNOSIS — D509 Iron deficiency anemia, unspecified: Secondary | ICD-10-CM

## 2020-11-07 DIAGNOSIS — G4730 Sleep apnea, unspecified: Secondary | ICD-10-CM | POA: Insufficient documentation

## 2020-11-07 DIAGNOSIS — I129 Hypertensive chronic kidney disease with stage 1 through stage 4 chronic kidney disease, or unspecified chronic kidney disease: Secondary | ICD-10-CM | POA: Diagnosis not present

## 2020-11-07 DIAGNOSIS — F17210 Nicotine dependence, cigarettes, uncomplicated: Secondary | ICD-10-CM | POA: Diagnosis not present

## 2020-11-07 DIAGNOSIS — D751 Secondary polycythemia: Secondary | ICD-10-CM | POA: Insufficient documentation

## 2020-11-07 DIAGNOSIS — N183 Chronic kidney disease, stage 3 unspecified: Secondary | ICD-10-CM | POA: Insufficient documentation

## 2020-11-07 DIAGNOSIS — C9001 Multiple myeloma in remission: Secondary | ICD-10-CM | POA: Diagnosis not present

## 2020-11-07 DIAGNOSIS — Z79899 Other long term (current) drug therapy: Secondary | ICD-10-CM | POA: Diagnosis not present

## 2020-11-07 LAB — HEMOGLOBIN AND HEMATOCRIT, BLOOD
HCT: 54.9 % — ABNORMAL HIGH (ref 39.0–52.0)
Hemoglobin: 18.3 g/dL — ABNORMAL HIGH (ref 13.0–17.0)

## 2020-11-07 NOTE — Progress Notes (Signed)
300 ml of blood removed for therapeutic phlebotomy as his HCT was 54.9. Tolerated procedure well. Pt felt fine. VSS at discharge.

## 2020-11-07 NOTE — Patient Instructions (Signed)

## 2020-11-12 DIAGNOSIS — N181 Chronic kidney disease, stage 1: Secondary | ICD-10-CM | POA: Diagnosis not present

## 2020-11-12 DIAGNOSIS — E119 Type 2 diabetes mellitus without complications: Secondary | ICD-10-CM | POA: Diagnosis not present

## 2020-11-14 DIAGNOSIS — I2510 Atherosclerotic heart disease of native coronary artery without angina pectoris: Secondary | ICD-10-CM | POA: Diagnosis not present

## 2020-11-14 DIAGNOSIS — M150 Primary generalized (osteo)arthritis: Secondary | ICD-10-CM | POA: Diagnosis not present

## 2020-11-14 DIAGNOSIS — J449 Chronic obstructive pulmonary disease, unspecified: Secondary | ICD-10-CM | POA: Diagnosis not present

## 2020-11-14 DIAGNOSIS — R0602 Shortness of breath: Secondary | ICD-10-CM | POA: Diagnosis not present

## 2020-11-14 DIAGNOSIS — I10 Essential (primary) hypertension: Secondary | ICD-10-CM | POA: Diagnosis not present

## 2020-11-14 DIAGNOSIS — M109 Gout, unspecified: Secondary | ICD-10-CM | POA: Diagnosis not present

## 2020-11-14 DIAGNOSIS — N181 Chronic kidney disease, stage 1: Secondary | ICD-10-CM | POA: Diagnosis not present

## 2020-11-14 DIAGNOSIS — F411 Generalized anxiety disorder: Secondary | ICD-10-CM | POA: Diagnosis not present

## 2020-11-14 DIAGNOSIS — F17200 Nicotine dependence, unspecified, uncomplicated: Secondary | ICD-10-CM | POA: Diagnosis not present

## 2020-11-14 DIAGNOSIS — E119 Type 2 diabetes mellitus without complications: Secondary | ICD-10-CM | POA: Diagnosis not present

## 2020-11-14 DIAGNOSIS — K922 Gastrointestinal hemorrhage, unspecified: Secondary | ICD-10-CM | POA: Diagnosis not present

## 2020-11-14 DIAGNOSIS — E782 Mixed hyperlipidemia: Secondary | ICD-10-CM | POA: Diagnosis not present

## 2020-11-17 ENCOUNTER — Ambulatory Visit: Payer: Medicare PPO | Admitting: Podiatry

## 2020-11-20 ENCOUNTER — Encounter: Payer: Self-pay | Admitting: Podiatry

## 2020-11-20 ENCOUNTER — Other Ambulatory Visit: Payer: Self-pay

## 2020-11-20 ENCOUNTER — Ambulatory Visit (INDEPENDENT_AMBULATORY_CARE_PROVIDER_SITE_OTHER): Payer: Medicare PPO | Admitting: Podiatry

## 2020-11-20 DIAGNOSIS — B351 Tinea unguium: Secondary | ICD-10-CM

## 2020-11-20 DIAGNOSIS — M79609 Pain in unspecified limb: Secondary | ICD-10-CM | POA: Diagnosis not present

## 2020-11-20 DIAGNOSIS — I739 Peripheral vascular disease, unspecified: Secondary | ICD-10-CM | POA: Diagnosis not present

## 2020-11-20 DIAGNOSIS — D689 Coagulation defect, unspecified: Secondary | ICD-10-CM | POA: Diagnosis not present

## 2020-11-20 DIAGNOSIS — E1140 Type 2 diabetes mellitus with diabetic neuropathy, unspecified: Secondary | ICD-10-CM

## 2020-11-20 NOTE — Progress Notes (Signed)
This patient returns to my office for at risk foot care.  This patient requires this care by a professional since this patient will be at risk due to having PAD and diabetes. And coagulation defect.  Patient is taking eliquiss.  This patient is unable to cut nails himself since the patient cannot reach his nails.These nails are painful walking and wearing shoes.  This patient presents for at risk foot care today.  General Appearance  Alert, conversant and in no acute stress.  Vascular  Dorsalis pedis and posterior tibial  pulses are not  palpable  bilaterally.  Capillary return is within normal limits  bilaterally. Temperature is within normal limits  bilaterally.  Neurologic  Senn-Weinstein monofilament wire test within normal limits  bilaterally. Muscle power within normal limits bilaterally.  Nails Thick disfigured discolored nails with subungual debris  from hallux to fifth toes bilaterally. No evidence of bacterial infection or drainage bilaterally.  Orthopedic  No limitations of motion  feet .  No crepitus or effusions noted.  No bony pathology or digital deformities noted.  HAV wuth hammer toe  B/L.  Rigid contracted second digit right foot.  Skin  normotropic skin with no porokeratosis noted bilaterally.  No signs of infections or ulcers noted.     Onychomycosis  Pain in right toes  Pain in left toes  Consent was obtained for treatment procedures.   Mechanical debridement of nails 1-5  bilaterally performed with a nail nipper.  Filed with dremel without incident.    Return office visit    10 weeks                 Told patient to return for periodic foot care and evaluation due to potential at risk complications.   Gardiner Barefoot DPM

## 2020-11-21 ENCOUNTER — Telehealth: Payer: Self-pay

## 2020-11-21 ENCOUNTER — Inpatient Hospital Stay: Payer: Medicare PPO

## 2020-11-21 VITALS — BP 102/72 | HR 73 | Temp 98.0°F | Resp 18

## 2020-11-21 DIAGNOSIS — C9001 Multiple myeloma in remission: Secondary | ICD-10-CM | POA: Diagnosis not present

## 2020-11-21 DIAGNOSIS — R5383 Other fatigue: Secondary | ICD-10-CM | POA: Diagnosis not present

## 2020-11-21 DIAGNOSIS — D751 Secondary polycythemia: Secondary | ICD-10-CM

## 2020-11-21 DIAGNOSIS — G4730 Sleep apnea, unspecified: Secondary | ICD-10-CM | POA: Diagnosis not present

## 2020-11-21 DIAGNOSIS — M069 Rheumatoid arthritis, unspecified: Secondary | ICD-10-CM | POA: Diagnosis not present

## 2020-11-21 DIAGNOSIS — N183 Chronic kidney disease, stage 3 unspecified: Secondary | ICD-10-CM | POA: Diagnosis not present

## 2020-11-21 DIAGNOSIS — D500 Iron deficiency anemia secondary to blood loss (chronic): Secondary | ICD-10-CM | POA: Diagnosis not present

## 2020-11-21 DIAGNOSIS — I129 Hypertensive chronic kidney disease with stage 1 through stage 4 chronic kidney disease, or unspecified chronic kidney disease: Secondary | ICD-10-CM | POA: Diagnosis not present

## 2020-11-21 DIAGNOSIS — Z79899 Other long term (current) drug therapy: Secondary | ICD-10-CM | POA: Diagnosis not present

## 2020-11-21 DIAGNOSIS — D509 Iron deficiency anemia, unspecified: Secondary | ICD-10-CM

## 2020-11-21 DIAGNOSIS — D631 Anemia in chronic kidney disease: Secondary | ICD-10-CM | POA: Diagnosis not present

## 2020-11-21 LAB — HEMOGLOBIN AND HEMATOCRIT, BLOOD
HCT: 54.2 % — ABNORMAL HIGH (ref 39.0–52.0)
Hemoglobin: 18.2 g/dL — ABNORMAL HIGH (ref 13.0–17.0)

## 2020-11-21 MED ORDER — SODIUM CHLORIDE 0.9 % IV SOLN
Freq: Once | INTRAVENOUS | Status: AC
  Filled 2020-11-21: qty 250

## 2020-11-21 NOTE — Telephone Encounter (Signed)
Per [place] form Meribeth Mattes, RN : pt here for phlebotomy. B/P 92/56. This was a manual recheck. pt states he feels a "headache coming on". but uncomfortable pulling blood off with low b/p .  Pt got 1L of NS over 1 hour and BP was remeasured after: 108/62. Pt ok to proceed iwth 300cc phleb per MD.   Patient scheduled to return in 2 weeks for lab (HH)/ phelb.

## 2020-11-21 NOTE — Progress Notes (Signed)
B/p 92/56, Pt reports feeling "Like a headache is coming on". HCT 54.2. Per Dr. Tasia Catchings give 1 Liter NS over 1 hour, recheck b/p post fluids.   1455: Post fluids B/P 108/62, Per Dr. Tasia Catchings proceed with 300cc phlebotomy and recheck b/pb post. If Sysolic is less than 124 give an additional 500cc of NS.   Only able to drain approx. 50cc from left wrist PIV.   1524: Rt Wrist PIV started for phlebotomy.  1555: 250cc drained from rt PIV.   1600: b/p 102/72, pt denies any complaints at this time pt tolerated 300 cc phlebotomy well, no s/s of distress noted. Per Dr. Tasia Catchings okay to discharge home.   1610: Pt stable at discharge, pt states he will get appts from my chart.

## 2020-11-21 NOTE — Patient Instructions (Signed)

## 2020-12-05 ENCOUNTER — Inpatient Hospital Stay: Payer: Medicare PPO | Attending: Oncology

## 2020-12-05 ENCOUNTER — Inpatient Hospital Stay: Payer: Medicare PPO

## 2020-12-05 ENCOUNTER — Telehealth: Payer: Self-pay

## 2020-12-05 VITALS — BP 98/56 | HR 74 | Temp 98.0°F

## 2020-12-05 DIAGNOSIS — F17210 Nicotine dependence, cigarettes, uncomplicated: Secondary | ICD-10-CM | POA: Insufficient documentation

## 2020-12-05 DIAGNOSIS — Z8546 Personal history of malignant neoplasm of prostate: Secondary | ICD-10-CM | POA: Diagnosis not present

## 2020-12-05 DIAGNOSIS — C9000 Multiple myeloma not having achieved remission: Secondary | ICD-10-CM | POA: Insufficient documentation

## 2020-12-05 DIAGNOSIS — D751 Secondary polycythemia: Secondary | ICD-10-CM | POA: Diagnosis not present

## 2020-12-05 DIAGNOSIS — I4891 Unspecified atrial fibrillation: Secondary | ICD-10-CM | POA: Insufficient documentation

## 2020-12-05 DIAGNOSIS — Z7951 Long term (current) use of inhaled steroids: Secondary | ICD-10-CM | POA: Diagnosis not present

## 2020-12-05 DIAGNOSIS — Z923 Personal history of irradiation: Secondary | ICD-10-CM | POA: Diagnosis not present

## 2020-12-05 DIAGNOSIS — M069 Rheumatoid arthritis, unspecified: Secondary | ICD-10-CM | POA: Diagnosis not present

## 2020-12-05 DIAGNOSIS — Z79899 Other long term (current) drug therapy: Secondary | ICD-10-CM | POA: Insufficient documentation

## 2020-12-05 DIAGNOSIS — N183 Chronic kidney disease, stage 3 unspecified: Secondary | ICD-10-CM | POA: Diagnosis not present

## 2020-12-05 DIAGNOSIS — D509 Iron deficiency anemia, unspecified: Secondary | ICD-10-CM

## 2020-12-05 DIAGNOSIS — D631 Anemia in chronic kidney disease: Secondary | ICD-10-CM | POA: Diagnosis not present

## 2020-12-05 DIAGNOSIS — Z7901 Long term (current) use of anticoagulants: Secondary | ICD-10-CM | POA: Insufficient documentation

## 2020-12-05 DIAGNOSIS — E119 Type 2 diabetes mellitus without complications: Secondary | ICD-10-CM | POA: Diagnosis not present

## 2020-12-05 DIAGNOSIS — Z7902 Long term (current) use of antithrombotics/antiplatelets: Secondary | ICD-10-CM | POA: Diagnosis not present

## 2020-12-05 LAB — HEMOGLOBIN AND HEMATOCRIT, BLOOD
HCT: 54.6 % — ABNORMAL HIGH (ref 39.0–52.0)
Hemoglobin: 17.9 g/dL — ABNORMAL HIGH (ref 13.0–17.0)

## 2020-12-05 NOTE — Telephone Encounter (Signed)
I believe patient is getting phlebotomy right now?

## 2020-12-05 NOTE — Telephone Encounter (Signed)
-----   Message from Secundino Ginger sent at 12/05/2020  2:47 PM EDT ----- Regarding: cancellation./ reschedule Patient called and got answering service. He left message that his car broke down and he wants to reschedule todays appt.

## 2020-12-05 NOTE — Telephone Encounter (Signed)
Great! Thanks, no need to r/s then.

## 2020-12-05 NOTE — Telephone Encounter (Signed)
Please r/s today's missed lab/ phleb to next available date and notify pt of appt. Thanks.

## 2020-12-09 NOTE — Progress Notes (Signed)
Patient in office today and seen by EJ with [facility] for diabetic shoe measurement and casting. Patient's diabetes is being treated by Dr. Elijio Miles at this time. Shoe selection was made and patient advised that the office will call when shoes are available for pick-up. Patient verbalized understanding.

## 2020-12-22 ENCOUNTER — Inpatient Hospital Stay: Payer: Medicare PPO

## 2020-12-22 ENCOUNTER — Other Ambulatory Visit: Payer: Self-pay

## 2020-12-22 DIAGNOSIS — N183 Chronic kidney disease, stage 3 unspecified: Secondary | ICD-10-CM | POA: Diagnosis not present

## 2020-12-22 DIAGNOSIS — E119 Type 2 diabetes mellitus without complications: Secondary | ICD-10-CM | POA: Diagnosis not present

## 2020-12-22 DIAGNOSIS — D751 Secondary polycythemia: Secondary | ICD-10-CM | POA: Diagnosis not present

## 2020-12-22 DIAGNOSIS — Z8546 Personal history of malignant neoplasm of prostate: Secondary | ICD-10-CM | POA: Diagnosis not present

## 2020-12-22 DIAGNOSIS — F17210 Nicotine dependence, cigarettes, uncomplicated: Secondary | ICD-10-CM | POA: Diagnosis not present

## 2020-12-22 DIAGNOSIS — D631 Anemia in chronic kidney disease: Secondary | ICD-10-CM | POA: Diagnosis not present

## 2020-12-22 DIAGNOSIS — D509 Iron deficiency anemia, unspecified: Secondary | ICD-10-CM

## 2020-12-22 DIAGNOSIS — C9000 Multiple myeloma not having achieved remission: Secondary | ICD-10-CM | POA: Diagnosis not present

## 2020-12-22 DIAGNOSIS — Z923 Personal history of irradiation: Secondary | ICD-10-CM | POA: Diagnosis not present

## 2020-12-22 DIAGNOSIS — M069 Rheumatoid arthritis, unspecified: Secondary | ICD-10-CM | POA: Diagnosis not present

## 2020-12-22 LAB — IRON AND TIBC
Iron: 50 ug/dL (ref 45–182)
Saturation Ratios: 13 % — ABNORMAL LOW (ref 17.9–39.5)
TIBC: 378 ug/dL (ref 250–450)
UIBC: 328 ug/dL

## 2020-12-22 LAB — CBC WITH DIFFERENTIAL/PLATELET
Abs Immature Granulocytes: 0.05 10*3/uL (ref 0.00–0.07)
Basophils Absolute: 0 10*3/uL (ref 0.0–0.1)
Basophils Relative: 1 %
Eosinophils Absolute: 0.4 10*3/uL (ref 0.0–0.5)
Eosinophils Relative: 4 %
HCT: 54.1 % — ABNORMAL HIGH (ref 39.0–52.0)
Hemoglobin: 18 g/dL — ABNORMAL HIGH (ref 13.0–17.0)
Immature Granulocytes: 1 %
Lymphocytes Relative: 18 %
Lymphs Abs: 1.6 10*3/uL (ref 0.7–4.0)
MCH: 30.6 pg (ref 26.0–34.0)
MCHC: 33.3 g/dL (ref 30.0–36.0)
MCV: 91.9 fL (ref 80.0–100.0)
Monocytes Absolute: 0.8 10*3/uL (ref 0.1–1.0)
Monocytes Relative: 9 %
Neutro Abs: 6 10*3/uL (ref 1.7–7.7)
Neutrophils Relative %: 67 %
Platelets: 154 10*3/uL (ref 150–400)
RBC: 5.89 MIL/uL — ABNORMAL HIGH (ref 4.22–5.81)
RDW: 15.2 % (ref 11.5–15.5)
WBC: 8.8 10*3/uL (ref 4.0–10.5)
nRBC: 0 % (ref 0.0–0.2)

## 2020-12-22 LAB — FERRITIN: Ferritin: 20 ng/mL — ABNORMAL LOW (ref 24–336)

## 2020-12-24 ENCOUNTER — Other Ambulatory Visit: Payer: Self-pay

## 2020-12-24 ENCOUNTER — Inpatient Hospital Stay: Payer: Medicare PPO

## 2020-12-24 ENCOUNTER — Encounter: Payer: Self-pay | Admitting: Oncology

## 2020-12-24 ENCOUNTER — Inpatient Hospital Stay (HOSPITAL_BASED_OUTPATIENT_CLINIC_OR_DEPARTMENT_OTHER): Payer: Medicare PPO | Admitting: Oncology

## 2020-12-24 VITALS — BP 94/62 | HR 89

## 2020-12-24 VITALS — BP 116/71 | HR 92 | Temp 97.5°F | Resp 18 | Wt 212.8 lb

## 2020-12-24 DIAGNOSIS — C9000 Multiple myeloma not having achieved remission: Secondary | ICD-10-CM | POA: Diagnosis not present

## 2020-12-24 DIAGNOSIS — D509 Iron deficiency anemia, unspecified: Secondary | ICD-10-CM | POA: Diagnosis not present

## 2020-12-24 DIAGNOSIS — Z923 Personal history of irradiation: Secondary | ICD-10-CM | POA: Diagnosis not present

## 2020-12-24 DIAGNOSIS — Z8546 Personal history of malignant neoplasm of prostate: Secondary | ICD-10-CM

## 2020-12-24 DIAGNOSIS — M069 Rheumatoid arthritis, unspecified: Secondary | ICD-10-CM | POA: Diagnosis not present

## 2020-12-24 DIAGNOSIS — N183 Chronic kidney disease, stage 3 unspecified: Secondary | ICD-10-CM | POA: Diagnosis not present

## 2020-12-24 DIAGNOSIS — D751 Secondary polycythemia: Secondary | ICD-10-CM

## 2020-12-24 DIAGNOSIS — C9001 Multiple myeloma in remission: Secondary | ICD-10-CM | POA: Diagnosis not present

## 2020-12-24 DIAGNOSIS — F17210 Nicotine dependence, cigarettes, uncomplicated: Secondary | ICD-10-CM | POA: Diagnosis not present

## 2020-12-24 DIAGNOSIS — D631 Anemia in chronic kidney disease: Secondary | ICD-10-CM | POA: Diagnosis not present

## 2020-12-24 DIAGNOSIS — E119 Type 2 diabetes mellitus without complications: Secondary | ICD-10-CM | POA: Diagnosis not present

## 2020-12-24 NOTE — Progress Notes (Signed)
Hematology/Oncology Follow Up Note [facility] Telephone:([phone] [phone]) [phone]  Patient Care Team: Jodi Marble, MD as PCP - General (Internal Medicine) Bary Castilla, Forest Gleason, MD (General Surgery) Edrick Kins, MD as Rounding Team (Internal Medicine) Hillary Bow, MD (Inactive) as Consulting Physician (Internal Medicine) [name], MD as Surgeon (Gastroenterology) Isaias Cowman, MD as Consulting Physician (Cardiology) Earlie Server, MD as Consulting Physician (Oncology)  REFERRING PROVIDER: Dr.Lateef, [place]  REASON FOR VISIT Follow up for management of plasma cell dyscrasia.  And iron deficiency anemia.   HISTORY OF PRESENTING ILLNESS:  80 y.o.  male with PMH listed below who presents to follow up on the evaluation and management of his abnormal urine protein electrophoresis results. I reviewed the records from [facility] [place] [place], [place] and [place] was performed and HemOnc related medical problems are listed below.  He lives with a friend. He has 3 adult children.   1 Chronic Kidney disease, Stage III: patient follows up with Dr.Lateef.  2 Proteinuria:  02/18/2017: Albumin/Creatinine ratio was 7, Urine protein electrophoresis,Random Urine revealed M Spike 43.2%, total protein of 43.39m/dl,  01/05/2017 Albumin/Creatinine ratio was 455.2, Albumin 996.5, , 2.15.2018 Albumin/Creatinine ratio was 80.3, Albumin 73.3, Urine protein electrophoresis,Random Urine revealed M Spike 29.7%, total protein of 29.124mdl, 06/30/2016 Serum protein electrophoresis did not detect M spike.  Autoimmune disorder work up showed Negative anti dsDNA, RNP antibodies, smith antibody, Sjogren antibodies, anti Jo-1, positive antichromotin antibodies, positive ANA,  3 Anemia of chronic kidney disease:  4 Iron deficiency anemia: history of iron deficiency, ferritin was 7 on 08/11/2016. He had EGD and colonoscopy that were done  this year which showed duodenitis/esphagitis/diverticulosis/benigh polyps. # [facility]  5 Rheumatoid arthritis: he follows up with Dr.Kernodle and is on chronic MTX. He Is currently off MTX reports joint pains are controlled, not quite symptomatic.   Patient denies any persistent bone pain or any pain. He continue to feel lack of energy, persistent, not improved with resting or taking naps. He also has lost 25 pounds in the past year which was unintentional.   # Rheumatoid arthritis and currently is off MTX.  # It is ambiguous whether he has truly symptomatic multiple myeloma or a smoldering myeloma, given that anemia can be secondary to iron deficiency and CKD.  The hypermetabolic 2.1 cm hypermetabolic soft tissue lesion in the left heel, can reflect a Plasmacytoma,which should have been biopsied. However patient initially denies being on any blood thinner, later he was found to be on Plavix with pre biopsy questionnaire screening, and biopsy was held. Patient has to contact cardiologist to hold plavix for seven days before procedure. He feels frustrated about multiple workup and meanwhile he becomes more fatigues, with worsening of kidney function. Patient was reluctant about proceeding additional invasive procedures and wants to start treatment. Decision was made to start active MM treatment.   # [facility]. He is not considered to  good candidate for transplant. Patient cardiologist Dr. KaChancy Milroyad started patient on Lasix 20 mg daily. [facility] as patient was having dizziness and borderline blood pressure during his clinic visit there.  10/02/2018 He had follow-up left foot/ankle x-ray as a follow-up of his left heel lesion which was initially presented on PET scan and not detectable on follow-up PET scan.  Images were reviewed by me.  No  suspicious acute or subacute osseous abnormality  Current Treatment:  S/p RVD x 4 cycles tolerates well except cytopenia.  Revlimid 10 mg PO once per day on days 1 to 14 (dose reduced due to GFR) Revlimid renal dosing of $RemoveB'10mg'GomQjjpu$ ,  Velcade 1.3 mg/m2 IV once per day on days 1,  8, 15 Dexamethasone 20 mg PO once per day on days 1, 8, 15 given patient's diabetes Given patient's multiple comorbidity and the side effects of cytopenia during his RVD treatment and the uncertainty of this is really a site of plasmacytoma or not, I recommend not to continue him on maintenance Revlimid.   # repeat small bowel capsule study on 02/16/2019 Which showed multiple AVMs seen in the mid jejunum proximal to the site of prior tattoo. # Patient was referred to [facility] and he is status post 06/19/2019 double-balloon enteroscopy with findings of 5 AVMs in the jejunum and 2 AVMs in the duodenum and AVMs were ablated with APC.  Tattoo was seen in the area. If further bleeding, recommend to consider treatment with octreotide. Patient takes Protonix daily.    INTERVAL HISTORY Patient presents for follow-up of management of multiple myeloma/smoldering multiple myeloma and iron deficiency, secondary erythrocytosis. Patient continues to smoke more than a pack of cigarettes/ day. He has CPAP machine and can maximally tolerated few hours per night.  Denies any fever, chills, unintentional weight loss, shortness of breath, chest pain.  Review of Systems  Constitutional:  Positive for fatigue. Negative for appetite change, chills, fever and unexpected weight change.  HENT:   Negative for hearing loss and voice change.   Eyes:  Negative for eye problems and icterus.  Respiratory:  Negative for chest tightness, cough and shortness of breath.   Cardiovascular:  Negative for chest pain and leg swelling.  Gastrointestinal:  Negative for abdominal distention, abdominal pain and diarrhea.  Endocrine: Negative for hot flashes.   Genitourinary:  Negative for difficulty urinating, dysuria and frequency.   Musculoskeletal:  Positive for arthralgias.  Skin:  Negative for itching and rash.  Neurological:  Negative for light-headedness and numbness.  Hematological:  Negative for adenopathy. Does not bruise/bleed easily.  Psychiatric/Behavioral:  Negative for confusion.     MEDICAL HISTORY:  Past Medical History:  Diagnosis Date   Anxiety    Arthritis    rheumatoid   Atrial fibrillation (HCC)    hx of   Cancer (HCC)    Chronic kidney disease    Colon polyp 07-07-15   TUBULAR ADENOMA WITH AT LEAST HIGH-GRADE / Dr Rayann Heman   Diabetes mellitus without complication ([place])    Dysrhythmia    bradycardia...Marland KitchenMarland Kitchen2 to 1 heart block   GERD (gastroesophageal reflux disease)    Heart murmur    patient unaware of history of murmur   Hypercholesterolemia    Hypertension    Presence of permanent cardiac pacemaker    Prostate cancer ([place]) 01/01/13, 01/30/14   Gleason 3+4=7, volume 46.6 cc   Rheumatoid arthritis ([place])    S/P radiation therapy  04/03/2014 through 06/04/2014                                                      Prostate 7800 cGy in 40 sessions  Sleep apnea    uses cpap but it is not in the hospital with him    SURGICAL HISTORY: Past Surgical History:  Procedure Laterality Date   ABDOMINAL AORTIC ANEURYSM REPAIR  11/2013   COLON SURGERY  March 2017   Right hemicolectomy for tubulovillous adenoma with high-grade dysplasia.   COLONOSCOPY WITH PROPOFOL N/A 07/07/2015   Procedure: COLONOSCOPY WITH PROPOFOL;  Surgeon: Josefine Class, MD;  Location[facility] ENDOSCOPY;  Service: Endoscopy;  Laterality: N/A;   COLONOSCOPY WITH PROPOFOL N/A 10/28/2016   Procedure: COLONOSCOPY WITH PROPOFOL;  Surgeon: [name], MD;  Location: [facility] ENDOSCOPY;  Service: Endoscopy;  Laterality: N/A;   ESOPHAGOGASTRODUODENOSCOPY (EGD) WITH PROPOFOL N/A 10/28/2016   Procedure: ESOPHAGOGASTRODUODENOSCOPY (EGD) WITH  PROPOFOL;  Surgeon: [name], MD;  Location: [facility] ENDOSCOPY;  Service: Endoscopy;  Laterality: N/A;   GIVENS CAPSULE STUDY N/A 07/25/2017   Procedure: GIVENS CAPSULE STUDY;  Surgeon: [name], MD;  Location: [facility] ENDOSCOPY;  Service: Gastroenterology;  Laterality: N/A;   GIVENS CAPSULE STUDY N/A 01/29/2019   Procedure: GIVENS CAPSULE STUDY;  Surgeon: [name], MD;  Location: [facility] ENDOSCOPY;  Service: Gastroenterology;  Laterality: N/A;   LAPAROSCOPIC RIGHT COLECTOMY Right 08/08/2015   Procedure: LAPAROSCOPIC RIGHT COLECTOMY;  Surgeon: Robert Bellow, MD;  Location: [facility] ORS;  Service: General;  Laterality: Right;   NASAL SINUS SURGERY     PACEMAKER INSERTION Left 08/26/2016   Procedure: INSERTION PACEMAKER;  Surgeon: Isaias Cowman, MD;  Location: [facility] ORS;  Service: Cardiovascular;  Laterality: Left;   PROSTATE BIOPSY  01/01/13, 01/30/14   Gleason 3+3=6, vol 46.6 cc   TONSILLECTOMY     uvula surgery     for sleep apnea    SOCIAL HISTORY: Social History   Socioeconomic History   Marital status: Divorced    Spouse name: Not on file   Number of children: Not on file   Years of education: Not on file   Highest education level: Not on file  Occupational History   Not on file  Tobacco Use   Smoking status: Every Day    Packs/day: 1.00    Years: 60.00    Pack years: 60.00    Types: Cigarettes   Smokeless tobacco: Never   Tobacco comments:    DOWN TO 1/2 PPD  Vaping Use   Vaping Use: Never used  Substance and Sexual Activity   Alcohol use: Yes    Alcohol/week: 1.0 standard drink    Types: 1 Cans of beer per week    Comment: occasionally   Drug use: No   Sexual activity: Not Currently  Other Topics Concern   Not on file  Social History Narrative   Not on file   Social Determinants of Health   Financial Resource Strain: Not on file  Food Insecurity: Not on file  Transportation Needs: Not on file  Physical Activity: Not on file  Stress: Not on file  Social  Connections: Not on file  Intimate Partner Violence: Not on file    FAMILY HISTORY: Family History  Problem Relation Age of Onset   Heart attack Mother    Cirrhosis Father    Pancreatic cancer Brother    Diabetes Brother    Diabetes Daughter        medication induced for cancer treatments   Cancer Daughter        breast, brain    ALLERGIES:  has No Known Allergies.  MEDICATIONS:  Current Outpatient Medications  Medication Sig Dispense Refill   acetaminophen (TYLENOL)  325 MG tablet Take 325 mg by mouth every 6 (six) hours as needed.     albuterol (PROVENTIL HFA;VENTOLIN HFA) 108 (90 Base) MCG/ACT inhaler      amLODipine-benazepril (LOTREL) 5-20 MG capsule Take 1 capsule by mouth daily.      apixaban (ELIQUIS) 2.5 MG TABS tablet Take 2.5 mg by mouth 2 (two) times daily.      atorvastatin (LIPITOR) 80 MG tablet Take by mouth.     benazepril (LOTENSIN) 20 MG tablet Take 20 mg by mouth daily.     Butalbital-APAP-Caffeine 50-300-40 MG CAPS Take 1 capsule every four to six hours as needed     Cholecalciferol 1.25 MG (50000 UT) capsule Take by mouth.      colchicine 0.6 MG tablet Take by mouth.     ezetimibe (ZETIA) 10 MG tablet Take 1 tablet by mouth 1 day or 1 dose.      ferrous sulfate 325 (65 FE) MG EC tablet Take 1 tablet (325 mg total) by mouth daily with breakfast. 90 tablet 1   fexofenadine (ALLEGRA) 180 MG tablet Take by mouth.     fluticasone (FLONASE) 50 MCG/ACT nasal spray Place 2 sprays into both nostrils daily as needed for rhinitis.      fluticasone furoate-vilanterol (BREO ELLIPTA) 100-25 MCG/INH AEPB Inhale 1 puff into the lungs daily.      furosemide (LASIX) 20 MG tablet Take 20 mg by mouth daily.     gabapentin (NEURONTIN) 400 MG capsule Take by mouth.     HUMIRA PEN 40 MG/0.4ML PNKT      hydroxychloroquine (PLAQUENIL) 200 MG tablet Take by mouth.     isosorbide mononitrate (IMDUR) 30 MG 24 hr tablet Take 30 mg by mouth daily.     JARDIANCE 25 MG TABS tablet       metoprolol succinate (TOPROL-XL) 25 MG 24 hr tablet Take 25 mg by mouth daily.      pantoprazole (PROTONIX) 20 MG tablet Take 20 mg by mouth daily.     tamsulosin (FLOMAX) 0.4 MG CAPS capsule Take 1 capsule (0.4 mg total) by mouth daily after supper. 90 capsule 3   tiZANidine (ZANAFLEX) 2 MG tablet Take 2 mg by mouth 3 (three) times daily.     traMADol-acetaminophen (ULTRACET) 37.5-325 MG tablet      triamcinolone cream (KENALOG) 0.1 % Apply 1 application topically 2 (two) times daily.     ALPRAZolam (XANAX) 0.25 MG tablet Take by mouth 2 (two) times daily as needed.  (Patient not taking: Reported on 12/24/2020)     glucose blood (ONETOUCH ULTRA) test strip      loperamide (IMODIUM) 2 MG capsule Take 1 capsule (2 mg total) by mouth See admin instructions. With onset of diarrhea, take 4mg  followed by 2mg  every 2 hours resolved. Maximum: 16 mg/day (Patient not taking: No sig reported) 120 capsule 0   ondansetron (ZOFRAN) 8 MG tablet Take 1 tablet (8 mg total) 2 (two) times daily as needed by mouth (Nausea or vomiting). (Patient not taking: Reported on 10/24/2020) 30 tablet 1   No current facility-administered medications for this visit.      Marland Kitchen  PHYSICAL EXAMINATION: ECOG PERFORMANCE STATUS: 1 - Symptomatic but completely ambulatory Vitals:   12/24/20 1411  BP: 116/71  Pulse: 92  Resp: 18  Temp: (!) 97.5 F (36.4 C)  SpO2: 97%   Filed Weights   12/24/20 1411  Weight: 212 lb 12.8 oz (96.5 kg)   Physical Exam Constitutional:  General: He is not in acute distress.    Appearance: He is not diaphoretic.  HENT:     Head: Normocephalic and atraumatic.     Nose: Nose normal.     Mouth/Throat:     Pharynx: No oropharyngeal exudate.  Eyes:     General: No scleral icterus.       Right eye: No discharge.        Left eye: No discharge.     Pupils: Pupils are equal, round, and reactive to light.  Neck:     Vascular: No JVD.  Cardiovascular:     Rate and Rhythm: Normal rate and  regular rhythm.     Heart sounds: Normal heart sounds. No murmur heard.   No friction rub.  Pulmonary:     Effort: Pulmonary effort is normal. No respiratory distress.     Breath sounds: Normal breath sounds. No wheezing or rales.  Chest:     Chest wall: No tenderness.  Abdominal:     General: Bowel sounds are normal. There is no distension.     Palpations: Abdomen is soft. There is no mass.     Tenderness: There is no abdominal tenderness.  Musculoskeletal:        General: No tenderness. Normal range of motion.     Cervical back: Normal range of motion and neck supple.  Lymphadenopathy:     Cervical: No cervical adenopathy.  Skin:    General: Skin is warm and dry.     Findings: No erythema.     Comments: Hypopigmentation of skin  Neurological:     Mental Status: He is alert and oriented to person, place, and time.  Psychiatric:        Mood and Affect: Affect normal.    LABORATORY DATA:  I have reviewed the data as listed Lab Results  Component Value Date   WBC 8.8 12/22/2020   HGB 18.0 (H) 12/22/2020   HCT 54.1 (H) 12/22/2020   MCV 91.9 12/22/2020   PLT 154 12/22/2020   Recent Labs    03/14/20 1115 06/18/20 1349 10/17/20 1242  NA 139 138 139  K 4.5 4.3 4.2  CL 106 105 107  CO2 $Re'25 27 24  'xIr$ GLUCOSE 95 85 141*  BUN $Re'16 12 15  'IWk$ CREATININE 1.37* 1.45* 1.63*  CALCIUM 8.6* 8.7* 8.8*  GFRNONAA 49* 49* 43*  PROT 8.0 7.5 7.9  ALBUMIN 3.5 3.5 3.8  AST $Re'16 20 24  'KqQ$ ALT $R'17 22 24  'hf$ ALKPHOS 62 65 67  BILITOT 0.6 0.4 0.7     SPEP: no M spike Serum free light chain Kappa/Lamda ratio 13.04 UPEP  Free light chain Kappa/lamda ratio 162.43, M spike $Remov'198mg'vLXVQT$ /24 hour   Bone marrow biopsy 03/21/2017  Bone Marrow, Aspirate,Biopsy, and Clot, left iliac - HYPERCELLULAR BONE MARROW FOR AGE WITH TRILINEAGE HEMATOPOIESIS. - PLASMACYTOSIS (PLASMA CELLS 8%)  Karyotype: loss of Y chromosome Cytogenetic MDS FISH Panel negative.   Bone marrow 04/06/2017  Bone Marrow, Aspirate,Biopsy, and  Clot, right iliac and core - HYPERCELLULAR BONE MARROW FOR AGE WITH PLASMA CELL NEOPLASM - TRILINEAGE HEMATOPOIESIS. - SEE COMMENT. PERIPHERAL BLOOD: - MICROCYTIC-HYPOCHROMIC ANEMIA. Diagnosis Note The bone marrow is hypercellular with trilineage hematopoiesis but with relative abundance of erythroid precursors and increased number of megakaryocytes with nonspecific changes. Significant dyspoiesis is not seen. Iron stores are present with no ring sideroblasts. The plasma cells are increased in number representing 8% of all cells in the aspirate although focal areas in the core biopsy show 10  to 20% as primarily seen by CD138 stain. In situ hybridization for kappa and lambda light chains show kappa light chain restriction consistent with plasma cell neoplasm. Correlation with cytogenetic and FISH studies is recommended. (BNS:ecj/gt 04/07/2017)  IMAGE STUDIES I have personally reviewed below image results.  03/15/2017 DG bone survey Met: Nonspecific lucent lesion in the distal right ulna measuring 15-16 mm, but otherwise normal bone mineralization for age throughout the visible skeleton. A solitary lytic lesion in a distal extremity would be an unusual presentation of multiple myeloma, and I favor the distal right ulna lesion is benign. Recommend correlation with serum and urine protein electrophoresis.  04/14/2017 PET scan: 1. 2.1 cm hypermetabolic soft tissue lesion in the left heel, adjacent to the calcaneal tuberosity. 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 2. Mottled FDG accumulation diffusely in the marrow space without other frankly overt hypermetabolic bony lesion. 3. Coronary artery and thoracoabdominal aortic atherosclerosis with abdominal aortic stent graft visualized in situ. 4. Bilateral renal cysts and bilateral nonobstructing renal stones.   08/24/2017 PET scan 1. No hypermetabolic osseous lesions. No definite signs of multiple myeloma on CT imaging. 2. Interval improvement in  soft tissue activity along the medial aspect of the left calcaneal tuberosity, likely plantar fasciitis. 3. New small left pleural effusion. 4. Bilateral inguinal hernias containing small bowel on the right and sigmoid colon on the left. No evidence of incarceration or obstruction. 5. Otherwise stable incidental findings including diffuse atherosclerosis, bilateral renal cysts, emphysema and sigmoid diverticulosis.  ASSESSMENT & PLAN:  1. Iron deficiency anemia, unspecified iron deficiency anemia type    #Iron deficiency anemia secondary to chronic blood loss due to multiple AVM in small bowels. Status post enteroscopy at [facility] for AVM ablation. Labs reviewed and discussed with patient. He has iron deficiency.  I will hold off supplementing his iron store.  See below    #Secondary erythrocytosis, likely due to smoking and sleep apnea Proceed with phlebotomy 300 cc x 1 today. Repeat H&H in 3, 6 weeks +/- phlebotomy.  Discussed about smoking cessation.   #Smoldering multiple myeloma/active myeloma if previous hypermetabolic lesion on PET scan was related to myeloma. Patient was treated with 4 cycles of RVD.  He is not currently on any maintenance therapy due to multiple comorbidities, borderline smoldering multiple myeloma status.  Light chain ratio slightly increased.   24-hour urine showed Bence-Jones protein. Monitor multiple myeloma panel every 6 months.   #History of prostate cancer, patient reports that he is not currently following up with anyone.  10/17/2020, PSA point 0.05   Follow-up with nurse practitioner in 9 weeks, labs CBC.  I will also order multiple myeloma panel. He will need to switch back to see me after NP visit.   Orders Placed This Encounter  Procedures   Hemoglobin and hematocrit, blood    Standing Status:   Standing    Number of Occurrences:   2    Standing Expiration Date:   12/24/2021   CBC with Differential/Platelet    Standing Status:   Future     Standing Expiration Date:   12/24/2021    Earlie Server, MD, PhD Hematology Oncology [place] [facility] Pager- [phone] 12/24/2020

## 2020-12-24 NOTE — Patient Instructions (Signed)
Therapeutic Phlebotomy Therapeutic phlebotomy is the planned removal of blood from a person's body for the purpose of treating a medical condition. The procedure is similar to donating blood. Usually, about a pint (470 mL, or 0.47 L) of blood is removed.The average adult has 9-12 pints (4.3-5.7 L) of blood in the body. Therapeutic phlebotomy may be used to treat the following medical conditions: Hemochromatosis. This is a condition in which the blood contains too much iron. Polycythemia vera. This is a condition in which the blood contains too many red blood cells. Porphyria cutanea tarda. This is a disease in which an important part of hemoglobin is not made properly. It results in the buildup of abnormal amounts of porphyrins in the body. Sickle cell disease. This is a condition in which the red blood cells form an abnormal crescent shape rather than a round shape. Tell a health care provider about: Any allergies you have. All medicines you are taking, including vitamins, herbs, eye drops, creams, and over-the-counter medicines. Any problems you or family members have had with anesthetic medicines. Any blood disorders you have. Any surgeries you have had. Any medical conditions you have. Whether you are pregnant or may be pregnant. What are the risks? Generally, this is a safe procedure. However, problems may occur, including: Nausea or light-headedness. Low blood pressure (hypotension). Soreness, bleeding, swelling, or bruising at the needle insertion site. Infection. What happens before the procedure? Follow instructions from your health care provider about eating or drinking restrictions. Ask your health care provider about: Changing or stopping your regular medicines. This is especially important if you are taking diabetes medicines or blood thinners (anticoagulants). Taking medicines such as aspirin and ibuprofen. These medicines can thin your blood. Do not take these medicines unless  your health care provider tells you to take them. Taking over-the-counter medicines, vitamins, herbs, and supplements. Wear clothing with sleeves that can be raised above the elbow. Plan to have someone take you home from the hospital or clinic. You may have a blood sample taken. Your blood pressure, pulse rate, and breathing rate will be measured. What happens during the procedure?  To lower your risk of infection: Your health care team will wash or sanitize their hands. Your skin will be cleaned with an antiseptic. You may be given a medicine to numb the area (local anesthetic). A tourniquet will be placed on your arm. A needle will be inserted into one of your veins. Tubing and a collection bag will be attached to that needle. Blood will flow through the needle and tubing into the collection bag. The collection bag will be placed lower than your arm to allow gravity to help the flow of blood into the bag. You may be asked to open and close your hand slowly and continually during the entire collection. After the specified amount of blood has been removed from your body, the collection bag and tubing will be clamped. The needle will be removed from your vein. Pressure will be held on the site of the needle insertion to stop the bleeding. A bandage (dressing) will be placed over the needle insertion site. The procedure may vary among health care providers and hospitals. What happens after the procedure? Your blood pressure, pulse rate, and breathing rate will be measured after the procedure. You will be encouraged to drink fluids. Your recovery will be assessed and monitored. You can return to your normal activities as told by your health care provider. Summary Therapeutic phlebotomy is the planned removal of   blood from a person's body for the purpose of treating a medical condition. Therapeutic phlebotomy may be used to treat hemochromatosis, polycythemia vera, porphyria cutanea tarda,  or sickle cell disease. In the procedure, a needle is inserted and about a pint (470 mL, or 0.47 L) of blood is removed. The average adult has 9-12 pints (4.3-5.7 L) of blood in the body. This is generally a safe procedure, but it can sometimes cause problems such as nausea, light-headedness, or low blood pressure (hypotension). This information is not intended to replace advice given to you by your health care provider. Make sure you discuss any questions you have with your healthcare provider. Document Revised: 06/09/2017 Document Reviewed: 06/09/2017 Elsevier Patient Education  2022 Elsevier Inc.  

## 2020-12-26 ENCOUNTER — Ambulatory Visit (INDEPENDENT_AMBULATORY_CARE_PROVIDER_SITE_OTHER): Payer: Medicare PPO

## 2020-12-26 ENCOUNTER — Ambulatory Visit (INDEPENDENT_AMBULATORY_CARE_PROVIDER_SITE_OTHER): Payer: Medicare PPO | Admitting: Vascular Surgery

## 2020-12-26 ENCOUNTER — Other Ambulatory Visit: Payer: Self-pay

## 2020-12-26 VITALS — BP 88/55 | HR 85 | Ht 71.0 in | Wt 212.0 lb

## 2020-12-26 DIAGNOSIS — I714 Abdominal aortic aneurysm, without rupture, unspecified: Secondary | ICD-10-CM

## 2020-12-26 DIAGNOSIS — F17200 Nicotine dependence, unspecified, uncomplicated: Secondary | ICD-10-CM | POA: Diagnosis not present

## 2020-12-26 DIAGNOSIS — E785 Hyperlipidemia, unspecified: Secondary | ICD-10-CM | POA: Diagnosis not present

## 2020-12-26 DIAGNOSIS — I10 Essential (primary) hypertension: Secondary | ICD-10-CM | POA: Diagnosis not present

## 2020-12-26 NOTE — Progress Notes (Signed)
MRN : [id number]  Omar [name] is a 80 y.o. ([birth year]) male who presents with chief complaint of  Chief Complaint  Patient presents with   Follow-up    1 yr Evar  .  History of Present Illness: Patient returns today in follow up of his AAA.  He is about 6 to 7 years status post endovascular repair of his aneurysm.  He is doing well.  He has no complaints today.  Duplex today shows a stable 4.06 cm abdominal aortic aneurysm sac status post repair.  His stent graft is widely patent and there is no sign of endoleak.  Current Outpatient Medications  Medication Sig Dispense Refill   acetaminophen (TYLENOL) 325 MG tablet Take 325 mg by mouth every 6 (six) hours as needed.     albuterol (PROVENTIL HFA;VENTOLIN HFA) 108 (90 Base) MCG/ACT inhaler      ALPRAZolam (XANAX) 0.25 MG tablet Take by mouth 2 (two) times daily as needed.     amLODipine-benazepril (LOTREL) 5-20 MG capsule Take 1 capsule by mouth daily.      apixaban (ELIQUIS) 2.5 MG TABS tablet Take 2.5 mg by mouth 2 (two) times daily.      atorvastatin (LIPITOR) 80 MG tablet Take by mouth.     benazepril (LOTENSIN) 20 MG tablet Take 20 mg by mouth daily.     Butalbital-APAP-Caffeine 50-300-40 MG CAPS Take 1 capsule every four to six hours as needed     Cholecalciferol 1.25 MG (50000 UT) capsule Take by mouth.      colchicine 0.6 MG tablet Take by mouth.     ezetimibe (ZETIA) 10 MG tablet Take 1 tablet by mouth 1 [name] or 1 dose.      ferrous sulfate 325 (65 FE) MG EC tablet Take 1 tablet (325 mg total) by mouth daily with breakfast. 90 tablet 1   fexofenadine (ALLEGRA) 180 MG tablet Take by mouth.     fluticasone (FLONASE) 50 MCG/ACT nasal spray Place 2 sprays into both nostrils daily as needed for rhinitis.      fluticasone furoate-vilanterol (BREO ELLIPTA) 100-25 MCG/INH AEPB Inhale 1 puff into the lungs daily.      furosemide (LASIX) 20 MG tablet Take 20 mg by mouth daily.     gabapentin (NEURONTIN) 400 MG capsule Take by  mouth.     HUMIRA PEN 40 MG/0.4ML PNKT      hydroxychloroquine (PLAQUENIL) 200 MG tablet Take by mouth.     isosorbide mononitrate (IMDUR) 30 MG 24 hr tablet Take 30 mg by mouth daily.     JARDIANCE 25 MG TABS tablet      loperamide (IMODIUM) 2 MG capsule Take 1 capsule (2 mg total) by mouth See admin instructions. With onset of diarrhea, take 30m followed by 240mevery 2 hours resolved. Maximum: 16 mg/[name] 120 capsule 0   metoprolol succinate (TOPROL-XL) 25 MG 24 hr tablet Take 25 mg by mouth daily.      pantoprazole (PROTONIX) 20 MG tablet Take 20 mg by mouth daily.     tamsulosin (FLOMAX) 0.4 MG CAPS capsule Take 1 capsule (0.4 mg total) by mouth daily after supper. 90 capsule 3   tiZANidine (ZANAFLEX) 2 MG tablet Take 2 mg by mouth 3 (three) times daily.     traMADol-acetaminophen (ULTRACET) 37.5-325 MG tablet      triamcinolone cream (KENALOG) 0.1 % Apply 1 application topically 2 (two) times daily.     glucose blood (ONETOUCH ULTRA) test strip  ondansetron (ZOFRAN) 8 MG tablet Take 1 tablet (8 mg total) 2 (two) times daily as needed by mouth (Nausea or vomiting). (Patient not taking: Reported on 10/24/2020) 30 tablet 1   No current facility-administered medications for this visit.    Past Medical History:  Diagnosis Date   Anxiety    Arthritis    rheumatoid   Atrial fibrillation (HCC)    hx of   Cancer ([place])    Chronic kidney disease    Colon polyp 07-07-15   TUBULAR ADENOMA WITH AT LEAST HIGH-GRADE / Dr Rayann Heman   Diabetes mellitus without complication ([place])    Dysrhythmia    bradycardia...Marland KitchenMarland Kitchen2 to 1 heart block   GERD (gastroesophageal reflux disease)    Heart murmur    patient unaware of history of murmur   Hypercholesterolemia    Hypertension    Presence of permanent cardiac pacemaker    Prostate cancer ([place]) 01/01/13, 01/30/14   Gleason 3+4=7, volume 46.6 cc   Rheumatoid arthritis ([place])    S/P radiation therapy  04/03/2014 through 06/04/2014                                                       Prostate 7800 cGy in 40 sessions                           Sleep apnea    uses cpap but it is not in the hospital with him    Past Surgical History:  Procedure Laterality Date   ABDOMINAL AORTIC ANEURYSM REPAIR  11/2013   COLON SURGERY  March 2017   Right hemicolectomy for tubulovillous adenoma with high-grade dysplasia.   COLONOSCOPY WITH PROPOFOL N/A 07/07/2015   Procedure: COLONOSCOPY WITH PROPOFOL;  Surgeon: Josefine Class, MD;  Location: [facility] ENDOSCOPY;  Service: Endoscopy;  Laterality: N/A;   COLONOSCOPY WITH PROPOFOL N/A 10/28/2016   Procedure: COLONOSCOPY WITH PROPOFOL;  Surgeon: [name], MD;  Location: [facility] ENDOSCOPY;  Service: Endoscopy;  Laterality: N/A;   ESOPHAGOGASTRODUODENOSCOPY (EGD) WITH PROPOFOL N/A 10/28/2016   Procedure: ESOPHAGOGASTRODUODENOSCOPY (EGD) WITH PROPOFOL;  Surgeon: [name], MD;  Location: [facility] ENDOSCOPY;  Service: Endoscopy;  Laterality: N/A;   GIVENS CAPSULE STUDY N/A 07/25/2017   Procedure: GIVENS CAPSULE STUDY;  Surgeon: [name], MD;  Location: [facility] ENDOSCOPY;  Service: Gastroenterology;  Laterality: N/A;   GIVENS CAPSULE STUDY N/A 01/29/2019   Procedure: GIVENS CAPSULE STUDY;  Surgeon: [name], MD;  Location: [facility] ENDOSCOPY;  Service: Gastroenterology;  Laterality: N/A;   LAPAROSCOPIC RIGHT COLECTOMY Right 08/08/2015   Procedure: LAPAROSCOPIC RIGHT COLECTOMY;  Surgeon: Robert Bellow, MD;  Location: [facility] ORS;  Service: General;  Laterality: Right;   NASAL SINUS SURGERY     PACEMAKER INSERTION Left 08/26/2016   Procedure: INSERTION PACEMAKER;  Surgeon: Isaias Cowman, MD;  Location: [facility] ORS;  Service: Cardiovascular;  Laterality: Left;   PROSTATE BIOPSY  01/01/13, 01/30/14   Gleason 3+3=6, vol 46.6 cc   TONSILLECTOMY     uvula surgery     for sleep apnea     Social History   Tobacco Use   Smoking status: Every [name]    Packs/[name]: 1.00    Years: 60.00    Pack years: 60.00    Types: Cigarettes    Smokeless tobacco: Never   Tobacco comments:  DOWN TO 1/2 PPD  Vaping Use   Vaping Use: Never used  Substance Use Topics   Alcohol use: Yes    Alcohol/week: 1.0 standard drink    Types: 1 Cans of beer per week    Comment: occasionally   Drug use: No     Family History  Problem Relation Age of Onset   Heart attack Mother    Cirrhosis Father    Pancreatic cancer Brother    Diabetes Brother    Diabetes Daughter        medication induced for cancer treatments   Cancer Daughter        breast, brain     No Known Allergies   REVIEW OF SYSTEMS (Negative unless checked)   Constitutional: '[]' Weight loss  '[]' Fever  '[]' Chills Cardiac: '[]' Chest pain   '[]' Chest pressure   '[]' Palpitations   '[]' Shortness of breath when laying flat   '[]' Shortness of breath at rest   '[x]' Shortness of breath with exertion. Vascular:  '[x]' Pain in legs with walking   '[]' Pain in legs at rest   '[]' Pain in legs when laying flat   '[]' Claudication   '[]' Pain in feet when walking  '[]' Pain in feet at rest  '[]' Pain in feet when laying flat   '[]' History of DVT   '[]' Phlebitis   '[x]' Swelling in legs   '[]' Varicose veins   '[]' Non-healing ulcers Pulmonary:   '[]' Uses home oxygen   '[]' Productive cough   '[]' Hemoptysis   '[]' Wheeze  '[]' COPD   '[]' Asthma Neurologic:  '[]' Dizziness  '[]' Blackouts   '[]' Seizures   '[]' History of stroke   '[]' History of TIA  '[]' Aphasia   '[]' Temporary blindness   '[]' Dysphagia   '[]' Weakness or numbness in arms   '[]' Weakness or numbness in legs Musculoskeletal:  '[]' Arthritis   '[]' Joint swelling   '[]' Joint pain   '[]' Low back pain Hematologic:  '[]' Easy bruising  '[]' Easy bleeding   '[]' Hypercoagulable state   '[]' Anemic   Gastrointestinal:  '[x]' Blood in stool   '[]' Vomiting blood  '[]' Gastroesophageal reflux/heartburn   '[x]' Abdominal pain Genitourinary:  '[]' Chronic kidney disease   '[]' Difficult urination  '[]' Frequent urination  '[]' Burning with urination   '[]' Hematuria Skin:  '[]' Rashes   '[]' Ulcers   '[]' Wounds Psychological:  '[]' History of anxiety   '[]'  History of major  depression.  Physical Examination  BP (!) 88/55   Pulse 85   Ht '5\' 11"'  (1.803 m)   Wt 212 lb (96.2 kg)   BMI 29.57 kg/m  Gen:  WD/WN, NAD. Appears younger than stated age. Head: [place]/AT, No temporalis wasting. Ear/Nose/Throat: Hearing grossly intact, nares w/o erythema or drainage Eyes: Conjunctiva clear. Sclera non-icteric Neck: Supple.  Trachea midline Pulmonary:  Good air movement, no use of accessory muscles.  Cardiac: RRR, no JVD Vascular:  Vessel Right Left  Radial Palpable Palpable                                   Gastrointestinal: soft, non-tender/non-distended. No guarding/reflex. No increased aortic impulse Musculoskeletal: M/S 5/5 throughout.  No deformity or atrophy. Mild BLE edema. Neurologic: Sensation grossly intact in extremities.  Symmetrical.  Speech is fluent.  Psychiatric: Judgment intact, Mood & affect appropriate for pt's clinical situation. Dermatologic: No rashes or ulcers noted.  No cellulitis or open wounds.      Labs Recent Results (from the past 2160 hour(s))  Bladder Scan (Post Void Residual) in office     Status: None   Collection Time: 10/08/20 11:34 AM  Result  Value Ref Range   Scan Result 10   PSA     Status: None   Collection Time: 10/08/20  1:44 PM  Result Value Ref Range   Prostate Specific Ag, Serum 0.1 0.0 - 4.0 ng/mL    Comment: Roche ECLIA methodology. According to the American Urological Association, Serum PSA should decrease and remain at undetectable levels after radical prostatectomy. The AUA defines biochemical recurrence as an initial PSA value 0.2 ng/mL or greater followed by a subsequent confirmatory PSA value 0.2 ng/mL or greater. Values obtained with different assay methods or kits cannot be used interchangeably. Results cannot be interpreted as absolute evidence of the presence or absence of malignant disease.   Multiple Myeloma Panel (SPEP&IFE w/QIG)     Status: Abnormal   Collection Time: 10/17/20 12:42 PM   Result Value Ref Range   IgG (Immunoglobin G), Serum 1,848 (H) 603 - 1,613 mg/dL   IgA 233 61 - 437 mg/dL   IgM (Immunoglobulin M), Srm 88 15 - 143 mg/dL   Total Protein ELP 7.0 6.0 - 8.5 g/dL   Albumin SerPl Elph-Mcnc 3.5 2.9 - 4.4 g/dL   Alpha 1 0.2 0.0 - 0.4 g/dL   Alpha2 Glob SerPl Elph-Mcnc 0.6 0.4 - 1.0 g/dL   B-Globulin SerPl Elph-Mcnc 1.0 0.7 - 1.3 g/dL   Gamma Glob SerPl Elph-Mcnc 1.7 0.4 - 1.8 g/dL   M Protein SerPl Elph-Mcnc Not Observed Not Observed g/dL   Globulin, Total 3.5 2.2 - 3.9 g/dL   Albumin/Glob SerPl 1.1 0.7 - 1.7   IFE 1 Comment (A)     Comment: Polyclonal increase detected in one or more immunoglobulins.   Please Note Comment     Comment: (NOTE) Protein electrophoresis scan will follow via computer, mail, or courier delivery. Performed At: [facility] [place], [place] [id number] Rush Farmer MD [id number]   CBC with Differential/Platelet     Status: Abnormal   Collection Time: 10/17/20 12:42 PM  Result Value Ref Range   WBC 7.7 4.0 - 10.5 K/uL   RBC 6.33 (H) 4.22 - 5.81 MIL/uL   Hemoglobin 18.8 (H) 13.0 - 17.0 g/dL   HCT 58.4 (H) 39.0 - 52.0 %   MCV 92.3 80.0 - 100.0 fL   MCH 29.7 26.0 - 34.0 pg   MCHC 32.2 30.0 - 36.0 g/dL   RDW 17.7 (H) 11.5 - 15.5 %   Platelets 166 150 - 400 K/uL   nRBC 0.0 0.0 - 0.2 %   Neutrophils Relative % 76 %   Neutro Abs 5.9 1.7 - 7.7 K/uL   Lymphocytes Relative 14 %   Lymphs Abs 1.1 0.7 - 4.0 K/uL   Monocytes Relative 5 %   Monocytes Absolute 0.4 0.1 - 1.0 K/uL   Eosinophils Relative 4 %   Eosinophils Absolute 0.3 0.0 - 0.5 K/uL   Basophils Relative 1 %   Basophils Absolute 0.0 0.0 - 0.1 K/uL   Immature Granulocytes 0 %   Abs Immature Granulocytes 0.03 0.00 - 0.07 K/uL    Comment: Performed at [facility], [address] [place], [place] [address]  Comprehensive metabolic panel     Status: Abnormal   Collection Time: 10/17/20 12:42 PM  Result Value Ref Range   Sodium 139  135 - 145 mmol/L   Potassium 4.2 3.5 - 5.1 mmol/L   Chloride 107 98 - 111 mmol/L   CO2 24 22 - 32 mmol/L   Glucose, Bld 141 (H) 70 - 99 mg/dL  Comment: Glucose reference range applies only to samples taken after fasting for at least 8 hours.   BUN 15 8 - 23 mg/dL   Creatinine, Ser 1.63 (H) 0.61 - 1.24 mg/dL   Calcium 8.8 (L) 8.9 - 10.3 mg/dL   Total Protein 7.9 6.5 - 8.1 g/dL   Albumin 3.8 3.5 - 5.0 g/dL   AST 24 15 - 41 U/L   ALT 24 0 - 44 U/L   Alkaline Phosphatase 67 38 - 126 U/L   Total Bilirubin 0.7 0.3 - 1.2 mg/dL   GFR, Estimated 43 (L) >60 mL/min    Comment: (NOTE) Calculated using the CKD-EPI Creatinine Equation (2021)    Anion gap 8 5 - 15    Comment: Performed at [facility], [address], [place], [place] [address]  Ferritin     Status: None   Collection Time: 10/17/20 12:42 PM  Result Value Ref Range   Ferritin 37 24 - 336 ng/mL    Comment: Performed at [facility], [address] [place], [place] [address]  Iron and TIBC     Status: None   Collection Time: 10/17/20 12:42 PM  Result Value Ref Range   Iron 57 45 - 182 ug/dL   TIBC 321 250 - 450 ug/dL   Saturation Ratios 18 17.9 - 39.5 %   UIBC 264 ug/dL    Comment: Performed at [facility], [address] [place], [place] [address]  Kappa/lambda light chains     Status: Abnormal   Collection Time: 10/17/20 12:42 PM  Result Value Ref Range   Kappa free light chain 102.8 (H) 3.3 - 19.4 mg/L   Lambda free light chains 21.0 5.7 - 26.3 mg/L   Kappa, lambda light chain ratio 4.90 (H) 0.26 - 1.65    Comment: (NOTE) Performed At: [facility] [place], [place] [id number] Rush Farmer MD [id number]   PSA     Status: None   Collection Time: 10/17/20 12:42 PM  Result Value Ref Range   Prostatic Specific Antigen 0.05 0.00 - 4.00 ng/mL    Comment: (NOTE) While PSA levels of <=4.0 ng/ml are reported as reference range, some men with levels below 4.0 ng/ml  can have prostate cancer and many men with PSA above 4.0 ng/ml do not have prostate cancer.  Other tests such as free PSA, age specific reference ranges, PSA velocity and PSA doubling time may be helpful especially in men less than 74 years old. Performed at [facility], [place] [address] [place], [place] [address]   24 hr Ur UPEP/UIFE/Light Chains/TP     Status: Abnormal   Collection Time: 10/28/20  6:00 AM  Result Value Ref Range   Total Protein, Urine 8.3 Not Estab. mg/dL   Total Protein, Urine-Ur/[name] 104 30 - 150 mg/24 hr   Albumin, U 17.8 %   ALPHA 1 URINE 9.9 %   Alpha 2, Urine 9.7 %   % BETA, Urine 48.6 %   GAMMA GLOBULIN URINE 14.1 %   Free Kappa Lt Chains,Ur 156.37 (H) 1.17 - 86.46 mg/L   Free Lambda Lt Chains,Ur 9.81 0.27 - 15.21 mg/L   Free Kappa/Lambda Ratio 15.94 (H) 1.83 - 14.26    Comment: (NOTE) Performed At: [facility] [place], [place] [id number] Rush Farmer MD [id number]    Immunofixation Result, Urine Comment (A)     Comment: Bence Jones Protein positive; kappa type.   Total Volume 1,250  Comment: Performed at [facility], [place], [place] [address]   M-SPIKE %, Urine 26.2 (H) Not Observed %   M-Spike, Mg/24 Hr 27 (H) Not Observed mg/24 hr   Note: Comment     Comment: (NOTE) Protein electrophoresis scan will follow via computer, mail, or courier delivery.   Hemoglobin and hematocrit, blood     Status: Abnormal   Collection Time: 11/07/20  1:52 PM  Result Value Ref Range   Hemoglobin 18.3 (H) 13.0 - 17.0 g/dL   HCT 54.9 (H) 39.0 - 52.0 %    Comment: Performed at [facility], [address] [place], [place] [address]  Hemoglobin and hematocrit, blood     Status: Abnormal   Collection Time: 11/21/20  1:02 PM  Result Value Ref Range   Hemoglobin 18.2 (H) 13.0 - 17.0 g/dL   HCT 54.2 (H) 39.0 - 52.0 %    Comment: Performed at [facility], [address] [place], [place]  [address]  Hemoglobin and hematocrit, blood     Status: Abnormal   Collection Time: 12/05/20  2:32 PM  Result Value Ref Range   Hemoglobin 17.9 (H) 13.0 - 17.0 g/dL   HCT 54.6 (H) 39.0 - 52.0 %    Comment: Performed at [facility], [address] [place], [place] [address]  Iron and TIBC     Status: Abnormal   Collection Time: 12/22/20  1:24 PM  Result Value Ref Range   Iron 50 45 - 182 ug/dL   TIBC 378 250 - 450 ug/dL   Saturation Ratios 13 (L) 17.9 - 39.5 %   UIBC 328 ug/dL    Comment: Performed at [facility] [place] [facility], [address] [place], [place] [address]  CBC with Differential/Platelet     Status: Abnormal   Collection Time: 12/22/20  1:24 PM  Result Value Ref Range   WBC 8.8 4.0 - 10.5 K/uL   RBC 5.89 (H) 4.22 - 5.81 MIL/uL   Hemoglobin 18.0 (H) 13.0 - 17.0 g/dL   HCT 54.1 (H) 39.0 - 52.0 %   MCV 91.9 80.0 - 100.0 fL   MCH 30.6 26.0 - 34.0 pg   MCHC 33.3 30.0 - 36.0 g/dL   RDW 15.2 11.5 - 15.5 %   Platelets 154 150 - 400 K/uL   nRBC 0.0 0.0 - 0.2 %   Neutrophils Relative % 67 %   Neutro Abs 6.0 1.7 - 7.7 K/uL   Lymphocytes Relative 18 %   Lymphs Abs 1.6 0.7 - 4.0 K/uL   Monocytes Relative 9 %   Monocytes Absolute 0.8 0.1 - 1.0 K/uL   Eosinophils Relative 4 %   Eosinophils Absolute 0.4 0.0 - 0.5 K/uL   Basophils Relative 1 %   Basophils Absolute 0.0 0.0 - 0.1 K/uL   Immature Granulocytes 1 %   Abs Immature Granulocytes 0.05 0.00 - 0.07 K/uL    Comment: Performed at [facility], [address] [place], [place] [address]  Ferritin     Status: Abnormal   Collection Time: 12/22/20  1:24 PM  Result Value Ref Range   Ferritin 20 (L) 24 - 336 ng/mL    Comment: Performed at [facility], [address] [place],  [address]    Radiology No results found.  Assessment/Plan BP (high blood pressure) blood pressure control important in reducing the progression of atherosclerotic disease and aneurysmal growth. On appropriate oral  medications.     HLD (hyperlipidemia) lipid  control important in reducing the progression of atherosclerotic disease. Continue statin therapy     Tobacco use disorder We have discussed this many times and he understands it is deleterious to his vascular system but he really has no interest in quitting.  AAA (abdominal aortic aneurysm) without rupture (HCC) Duplex today shows a stable 4.06 cm abdominal aortic aneurysm sac status post repair.  His stent graft is widely patent and there is no sign of endoleak. He is doing well. No changes. Recheck in one year.    Leotis Pain, MD  12/26/2020 10:34 AM    This note was created with Dragon medical transcription system.  Any errors from dictation are purely unintentional

## 2020-12-26 NOTE — Assessment & Plan Note (Signed)
Duplex today shows a stable 4.06 cm abdominal aortic aneurysm sac status post repair.  His stent graft is widely patent and there is no sign of endoleak. He is doing well. No changes. Recheck in one year.

## 2021-01-14 ENCOUNTER — Other Ambulatory Visit: Payer: Self-pay

## 2021-01-14 ENCOUNTER — Inpatient Hospital Stay: Payer: Medicare PPO | Attending: Oncology

## 2021-01-14 ENCOUNTER — Inpatient Hospital Stay: Payer: Medicare PPO

## 2021-01-14 DIAGNOSIS — D509 Iron deficiency anemia, unspecified: Secondary | ICD-10-CM

## 2021-01-14 DIAGNOSIS — D751 Secondary polycythemia: Secondary | ICD-10-CM | POA: Insufficient documentation

## 2021-01-14 LAB — HEMOGLOBIN AND HEMATOCRIT, BLOOD
HCT: 52.5 % — ABNORMAL HIGH (ref 39.0–52.0)
Hemoglobin: 17.1 g/dL — ABNORMAL HIGH (ref 13.0–17.0)

## 2021-01-14 NOTE — Progress Notes (Signed)
300 ml blood removed per phlebotomy parameters. HCT 42.5. Tolerated procedure well. Denies any headache dizziness or weakness. Discharged to home post observation period.

## 2021-01-14 NOTE — Patient Instructions (Signed)

## 2021-01-16 DIAGNOSIS — G4733 Obstructive sleep apnea (adult) (pediatric): Secondary | ICD-10-CM | POA: Diagnosis not present

## 2021-01-29 ENCOUNTER — Other Ambulatory Visit: Payer: Self-pay | Admitting: Urology

## 2021-01-29 ENCOUNTER — Ambulatory Visit: Payer: Medicare PPO | Admitting: Podiatry

## 2021-01-29 DIAGNOSIS — N138 Other obstructive and reflux uropathy: Secondary | ICD-10-CM

## 2021-01-29 DIAGNOSIS — N401 Enlarged prostate with lower urinary tract symptoms: Secondary | ICD-10-CM

## 2021-02-04 ENCOUNTER — Inpatient Hospital Stay: Payer: Medicare PPO

## 2021-02-04 DIAGNOSIS — D509 Iron deficiency anemia, unspecified: Secondary | ICD-10-CM | POA: Diagnosis not present

## 2021-02-04 DIAGNOSIS — D751 Secondary polycythemia: Secondary | ICD-10-CM | POA: Diagnosis not present

## 2021-02-04 LAB — HEMOGLOBIN AND HEMATOCRIT, BLOOD
HCT: 49.3 % (ref 39.0–52.0)
Hemoglobin: 15.9 g/dL (ref 13.0–17.0)

## 2021-02-04 NOTE — Progress Notes (Signed)
HCT 49.3 no phlebotomy required today per MD parameters.

## 2021-02-05 ENCOUNTER — Other Ambulatory Visit: Payer: Self-pay

## 2021-02-05 ENCOUNTER — Encounter: Payer: Self-pay | Admitting: Podiatry

## 2021-02-05 ENCOUNTER — Ambulatory Visit: Payer: Medicare PPO | Admitting: Podiatry

## 2021-02-05 DIAGNOSIS — B351 Tinea unguium: Secondary | ICD-10-CM

## 2021-02-05 DIAGNOSIS — M79609 Pain in unspecified limb: Secondary | ICD-10-CM

## 2021-02-05 DIAGNOSIS — E1140 Type 2 diabetes mellitus with diabetic neuropathy, unspecified: Secondary | ICD-10-CM

## 2021-02-05 DIAGNOSIS — D689 Coagulation defect, unspecified: Secondary | ICD-10-CM

## 2021-02-05 DIAGNOSIS — I739 Peripheral vascular disease, unspecified: Secondary | ICD-10-CM

## 2021-02-05 NOTE — Progress Notes (Signed)
This patient returns to my office for at risk foot care.  This patient requires this care by a professional since this patient will be at risk due to having PAD and diabetes. And coagulation defect.  Patient is taking eliquiss.  This patient is unable to cut nails himself since the patient cannot reach his nails.These nails are painful walking and wearing shoes.  This patient presents for at risk foot care today.  General Appearance  Alert, conversant and in no acute stress.  Vascular  Dorsalis pedis and posterior tibial  pulses are not  palpable  bilaterally.  Capillary return is within normal limits  bilaterally. Temperature is within normal limits  bilaterally.  Neurologic  Senn-Weinstein monofilament wire test within normal limits  bilaterally. Muscle power within normal limits bilaterally.  Nails Thick disfigured discolored nails with subungual debris  from hallux to fifth toes bilaterally. No evidence of bacterial infection or drainage bilaterally.  Orthopedic  No limitations of motion  feet .  No crepitus or effusions noted.  No bony pathology or digital deformities noted.  HAV wuth hammer toe  B/L.  Rigid contracted second digit right foot.  Skin  normotropic skin with no porokeratosis noted bilaterally.  No signs of infections or ulcers noted.     Onychomycosis  Pain in right toes  Pain in left toes  Consent was obtained for treatment procedures.   Mechanical debridement of nails 1-5  bilaterally performed with a nail nipper.  Filed with dremel without incident.    Return office visit    10 weeks                 Told patient to return for periodic foot care and evaluation due to potential at risk complications.   Gardiner Barefoot DPM

## 2021-02-12 DIAGNOSIS — M0579 Rheumatoid arthritis with rheumatoid factor of multiple sites without organ or systems involvement: Secondary | ICD-10-CM | POA: Diagnosis not present

## 2021-02-12 DIAGNOSIS — G8929 Other chronic pain: Secondary | ICD-10-CM | POA: Diagnosis not present

## 2021-02-12 DIAGNOSIS — M545 Low back pain, unspecified: Secondary | ICD-10-CM | POA: Diagnosis not present

## 2021-02-12 DIAGNOSIS — N1832 Chronic kidney disease, stage 3b: Secondary | ICD-10-CM | POA: Diagnosis not present

## 2021-02-12 DIAGNOSIS — C9001 Multiple myeloma in remission: Secondary | ICD-10-CM | POA: Diagnosis not present

## 2021-02-19 DIAGNOSIS — N181 Chronic kidney disease, stage 1: Secondary | ICD-10-CM | POA: Diagnosis not present

## 2021-02-19 DIAGNOSIS — E119 Type 2 diabetes mellitus without complications: Secondary | ICD-10-CM | POA: Diagnosis not present

## 2021-02-24 DIAGNOSIS — I209 Angina pectoris, unspecified: Secondary | ICD-10-CM | POA: Diagnosis not present

## 2021-02-24 DIAGNOSIS — J449 Chronic obstructive pulmonary disease, unspecified: Secondary | ICD-10-CM | POA: Diagnosis not present

## 2021-02-24 DIAGNOSIS — I2510 Atherosclerotic heart disease of native coronary artery without angina pectoris: Secondary | ICD-10-CM | POA: Diagnosis not present

## 2021-02-24 DIAGNOSIS — F411 Generalized anxiety disorder: Secondary | ICD-10-CM | POA: Diagnosis not present

## 2021-02-24 DIAGNOSIS — K922 Gastrointestinal hemorrhage, unspecified: Secondary | ICD-10-CM | POA: Diagnosis not present

## 2021-02-24 DIAGNOSIS — E119 Type 2 diabetes mellitus without complications: Secondary | ICD-10-CM | POA: Diagnosis not present

## 2021-02-24 DIAGNOSIS — F17210 Nicotine dependence, cigarettes, uncomplicated: Secondary | ICD-10-CM | POA: Diagnosis not present

## 2021-02-24 DIAGNOSIS — N181 Chronic kidney disease, stage 1: Secondary | ICD-10-CM | POA: Diagnosis not present

## 2021-02-24 DIAGNOSIS — M150 Primary generalized (osteo)arthritis: Secondary | ICD-10-CM | POA: Diagnosis not present

## 2021-02-24 DIAGNOSIS — R0602 Shortness of breath: Secondary | ICD-10-CM | POA: Diagnosis not present

## 2021-02-24 DIAGNOSIS — J309 Allergic rhinitis, unspecified: Secondary | ICD-10-CM | POA: Diagnosis not present

## 2021-02-24 DIAGNOSIS — E782 Mixed hyperlipidemia: Secondary | ICD-10-CM | POA: Diagnosis not present

## 2021-02-24 DIAGNOSIS — I10 Essential (primary) hypertension: Secondary | ICD-10-CM | POA: Diagnosis not present

## 2021-02-24 DIAGNOSIS — F17200 Nicotine dependence, unspecified, uncomplicated: Secondary | ICD-10-CM | POA: Diagnosis not present

## 2021-02-27 ENCOUNTER — Other Ambulatory Visit: Payer: Self-pay

## 2021-02-27 ENCOUNTER — Inpatient Hospital Stay: Payer: Medicare PPO

## 2021-02-27 ENCOUNTER — Inpatient Hospital Stay: Payer: Medicare PPO | Attending: Oncology

## 2021-02-27 ENCOUNTER — Inpatient Hospital Stay (HOSPITAL_BASED_OUTPATIENT_CLINIC_OR_DEPARTMENT_OTHER): Payer: Medicare PPO | Admitting: Oncology

## 2021-02-27 VITALS — BP 107/78 | HR 76 | Temp 98.1°F | Resp 16 | Wt 213.1 lb

## 2021-02-27 DIAGNOSIS — G4730 Sleep apnea, unspecified: Secondary | ICD-10-CM | POA: Insufficient documentation

## 2021-02-27 DIAGNOSIS — C9001 Multiple myeloma in remission: Secondary | ICD-10-CM | POA: Diagnosis not present

## 2021-02-27 DIAGNOSIS — D500 Iron deficiency anemia secondary to blood loss (chronic): Secondary | ICD-10-CM | POA: Diagnosis not present

## 2021-02-27 DIAGNOSIS — I129 Hypertensive chronic kidney disease with stage 1 through stage 4 chronic kidney disease, or unspecified chronic kidney disease: Secondary | ICD-10-CM | POA: Diagnosis not present

## 2021-02-27 DIAGNOSIS — N183 Chronic kidney disease, stage 3 unspecified: Secondary | ICD-10-CM | POA: Insufficient documentation

## 2021-02-27 DIAGNOSIS — M069 Rheumatoid arthritis, unspecified: Secondary | ICD-10-CM | POA: Insufficient documentation

## 2021-02-27 DIAGNOSIS — D509 Iron deficiency anemia, unspecified: Secondary | ICD-10-CM

## 2021-02-27 DIAGNOSIS — D631 Anemia in chronic kidney disease: Secondary | ICD-10-CM | POA: Diagnosis not present

## 2021-02-27 DIAGNOSIS — D751 Secondary polycythemia: Secondary | ICD-10-CM | POA: Diagnosis not present

## 2021-02-27 DIAGNOSIS — E1122 Type 2 diabetes mellitus with diabetic chronic kidney disease: Secondary | ICD-10-CM | POA: Insufficient documentation

## 2021-02-27 DIAGNOSIS — F17210 Nicotine dependence, cigarettes, uncomplicated: Secondary | ICD-10-CM | POA: Diagnosis not present

## 2021-02-27 DIAGNOSIS — Z8546 Personal history of malignant neoplasm of prostate: Secondary | ICD-10-CM | POA: Insufficient documentation

## 2021-02-27 LAB — CBC WITH DIFFERENTIAL/PLATELET
Abs Immature Granulocytes: 0.04 10*3/uL (ref 0.00–0.07)
Basophils Absolute: 0 10*3/uL (ref 0.0–0.1)
Basophils Relative: 0 %
Eosinophils Absolute: 0.5 10*3/uL (ref 0.0–0.5)
Eosinophils Relative: 5 %
HCT: 50.8 % (ref 39.0–52.0)
Hemoglobin: 16.1 g/dL (ref 13.0–17.0)
Immature Granulocytes: 0 %
Lymphocytes Relative: 18 %
Lymphs Abs: 1.6 10*3/uL (ref 0.7–4.0)
MCH: 28.8 pg (ref 26.0–34.0)
MCHC: 31.7 g/dL (ref 30.0–36.0)
MCV: 90.7 fL (ref 80.0–100.0)
Monocytes Absolute: 0.7 10*3/uL (ref 0.1–1.0)
Monocytes Relative: 8 %
Neutro Abs: 6.3 10*3/uL (ref 1.7–7.7)
Neutrophils Relative %: 69 %
Platelets: 199 10*3/uL (ref 150–400)
RBC: 5.6 MIL/uL (ref 4.22–5.81)
RDW: 14.9 % (ref 11.5–15.5)
WBC: 9.2 10*3/uL (ref 4.0–10.5)
nRBC: 0 % (ref 0.0–0.2)

## 2021-02-27 NOTE — Progress Notes (Signed)
Hematology/Oncology Follow Up Note [facility] [phone] [phone]  Patient Care Team: Jodi Marble, MD as PCP - General (Internal Medicine) Bary Castilla, Forest Gleason, MD (General Surgery) Edrick Kins, MD as Rounding Team (Internal Medicine) Hillary Bow, MD (Inactive) as Consulting Physician (Internal Medicine) [name], MD as Surgeon (Gastroenterology) Isaias Cowman, MD as Consulting Physician (Cardiology) Earlie Server, MD as Consulting Physician (Oncology)  REFERRING PROVIDER: Dr.Lateef, [place]  REASON FOR VISIT Follow up for management of plasma cell dyscrasia.  And iron deficiency anemia.  HISTORY OF PRESENTING ILLNESS: 80 y.o.  male with PMH listed below who presents to follow up on the evaluation and management of his abnormal urine protein electrophoresis results. I reviewed the records from [facility], [place] and [place] was performed and HemOnc related medical problems are listed below.    He has CKD stage III and is followed by Dr. Zollie Scale.  Noted to have proteinuria.  Work-up included urine protein electrophoresis which showed an M spike of 43.2% with a total protein of 43.7.  06/30/2016 serum protein electrophoresis did not detect M spike.  Autoimmune disorder work up showed Negative anti dsDNA, RNP antibodies, smith antibody, Sjogren antibodies, anti Jo-1, positive antichromotin antibodies, positive ANA,   Has history of anemia secondary to CKD and iron deficiency.  EGD and colonoscopy showed duodenitis/esphagitis/diverticulosis/benigh polyps and a small bowel AVMs was ablated at [facility].   Rheumatoid arthritis: he follows up with Dr.Kernodle and he has been on methotrexate but is currently not taking.  Reports joint pains are controlled, not quite symptomatic.   The hypermetabolic 2.1 cm hypermetabolic soft tissue lesion in the left heel, can reflect a  Plasmacytoma,which should have been biopsied. However patient initially denies being on any blood thinner, later he was found to be on Plavix with pre biopsy questionnaire screening, and biopsy was held. Patient has to contact cardiologist to hold plavix for seven days before procedure. He feels frustrated about multiple workup and meanwhile he becomes more fatigues, with worsening of kidney function. Patient was reluctant about proceeding additional invasive procedures and wants to start treatment. Decision was made to start active MM treatment.   He was evaluated by [facility] bone marrow transplant team for autologous bone marrow transplant. He is not considered to  good candidate for transplant. Patient cardiologist Dr. Chancy Milroy had started patient on Lasix 20 mg daily. Lasix was discontinued by [facility] bone marrow transplant team as patient was having dizziness and borderline blood pressure during his clinic visit there.  10/02/2018 He had follow-up left foot/ankle x-ray as a follow-up of his left heel lesion which was initially presented on PET scan and not detectable on follow-up PET scan.  Images were reviewed by me.  No suspicious acute or subacute osseous abnormality  Current Treatment:  S/p RVD x 4 cycles tolerates well except cytopenia.  Revlimid 10 mg PO once per day on days 1 to 14 (dose reduced due to GFR) Revlimid renal dosing of 21m,  Velcade 1.3 mg/m2 IV once per day on days 1,  8, 15 Dexamethasone 20 mg PO once per day on days 1, 8, 15 given patient's diabetes Given patient's multiple comorbidity and the side effects of cytopenia during his RVD treatment and the uncertainty of this is really a site of plasmacytoma or not, I recommend not to continue him on maintenance Revlimid.   Repeat small bowel capsule study on 02/16/2019 Which showed multiple AVMs seen in the mid jejunum proximal to  the site of prior tattoo. Patient was referred to [facility] and he is status post 06/19/2019 double-balloon enteroscopy  with findings of 5 AVMs in the jejunum and 2 AVMs in the duodenum and AVMs were ablated with APC.  Tattoo was seen in the area. If further bleeding, recommend to consider treatment with octreotide. Patient takes Protonix daily.  INTERVAL HISTORY Omar [name] presents today for follow-up for management of MGUS/smoldering multiple myeloma and iron deficiency anemia.  He also has secondary erythrocytosis due to smoking.  He continues to smoke but does endorse cutting back some.  He has chronic lower back pain and states "no medications help this".  He does not want surgery so he "lives with it".  He is not taking iron supplements.   Review of Systems  Constitutional: Negative.  Negative for appetite change, chills, fatigue and fever.  HENT:  Negative.  Negative for hearing loss, lump/mass, mouth sores and nosebleeds.   Eyes: Negative.  Negative for eye problems.  Respiratory:  Negative for cough, hemoptysis and shortness of breath.   Cardiovascular: Negative.  Negative for chest pain and leg swelling.  Gastrointestinal: Negative.  Negative for abdominal pain, blood in stool, constipation, diarrhea, nausea and vomiting.  Endocrine: Negative.  Negative for hot flashes.  Genitourinary: Negative.  Negative for bladder incontinence, difficulty urinating, dysuria, frequency and hematuria.   Musculoskeletal:  Positive for back pain. Negative for flank pain, gait problem and myalgias.  Skin: Negative.  Negative for itching and rash.  Neurological: Negative.  Negative for dizziness, gait problem, headaches, light-headedness and numbness.  Hematological: Negative.  Negative for adenopathy.  Psychiatric/Behavioral:  Negative for confusion. The patient is not nervous/anxious.     MEDICAL HISTORY:  Past Medical History:  Diagnosis Date   Anxiety    Arthritis    rheumatoid   Atrial fibrillation (HCC)    hx of   Cancer (HCC)    Chronic kidney disease    Colon polyp 07-07-15   TUBULAR ADENOMA WITH AT  LEAST HIGH-GRADE / Dr Rayann Heman   Diabetes mellitus without complication ([place])    Dysrhythmia    bradycardia...Marland KitchenMarland Kitchen2 to 1 heart block   GERD (gastroesophageal reflux disease)    Heart murmur    patient unaware of history of murmur   Hypercholesterolemia    Hypertension    Presence of permanent cardiac pacemaker    Prostate cancer ([place]) 01/01/13, 01/30/14   Gleason 3+4=7, volume 46.6 cc   Rheumatoid arthritis ([place])    S/P radiation therapy  04/03/2014 through 06/04/2014                                                      Prostate 7800 cGy in 40 sessions                           Sleep apnea    uses cpap but it is not in the hospital with him    SURGICAL HISTORY: Past Surgical History:  Procedure Laterality Date   ABDOMINAL AORTIC ANEURYSM REPAIR  11/2013   COLON SURGERY  March 2017   Right hemicolectomy for tubulovillous adenoma with high-grade dysplasia.   COLONOSCOPY WITH PROPOFOL N/A 07/07/2015   Procedure: COLONOSCOPY WITH PROPOFOL;  Surgeon: Josefine Class, MD;  Location: [facility] ENDOSCOPY;  Service: Endoscopy;  Laterality: N/A;  COLONOSCOPY WITH PROPOFOL N/A 10/28/2016   Procedure: COLONOSCOPY WITH PROPOFOL;  Surgeon: [name], MD;  Location[facility] ENDOSCOPY;  Service: Endoscopy;  Laterality: N/A;   ESOPHAGOGASTRODUODENOSCOPY (EGD) WITH PROPOFOL N/A 10/28/2016   Procedure: ESOPHAGOGASTRODUODENOSCOPY (EGD) WITH PROPOFOL;  Surgeon: [name], MD;  Location: [facility] ENDOSCOPY;  Service: Endoscopy;  Laterality: N/A;   GIVENS CAPSULE STUDY N/A 07/25/2017   Procedure: GIVENS CAPSULE STUDY;  Surgeon: [name], MD;  Location: [facility] ENDOSCOPY;  Service: Gastroenterology;  Laterality: N/A;   GIVENS CAPSULE STUDY N/A 01/29/2019   Procedure: GIVENS CAPSULE STUDY;  Surgeon: [name], MD;  Location: [facility] ENDOSCOPY;  Service: Gastroenterology;  Laterality: N/A;   LAPAROSCOPIC RIGHT COLECTOMY Right 08/08/2015   Procedure: LAPAROSCOPIC RIGHT COLECTOMY;  Surgeon: Robert Bellow, MD;  Location:  [facility] ORS;  Service: General;  Laterality: Right;   NASAL SINUS SURGERY     PACEMAKER INSERTION Left 08/26/2016   Procedure: INSERTION PACEMAKER;  Surgeon: Isaias Cowman, MD;  Location: [facility] ORS;  Service: Cardiovascular;  Laterality: Left;   PROSTATE BIOPSY  01/01/13, 01/30/14   Gleason 3+3=6, vol 46.6 cc   TONSILLECTOMY     uvula surgery     for sleep apnea    SOCIAL HISTORY: Social History   Socioeconomic History   Marital status: Divorced    Spouse name: Not on file   Number of children: Not on file   Years of education: Not on file   Highest education level: Not on file  Occupational History   Not on file  Tobacco Use   Smoking status: Every Day    Packs/day: 1.00    Years: 60.00    Pack years: 60.00    Types: Cigarettes   Smokeless tobacco: Never   Tobacco comments:    DOWN TO 1/2 PPD  Vaping Use   Vaping Use: Never used  Substance and Sexual Activity   Alcohol use: Yes    Alcohol/week: 1.0 standard drink    Types: 1 Cans of beer per week    Comment: occasionally   Drug use: No   Sexual activity: Not Currently  Other Topics Concern   Not on file  Social History Narrative   Not on file   Social Determinants of Health   Financial Resource Strain: Not on file  Food Insecurity: Not on file  Transportation Needs: Not on file  Physical Activity: Not on file  Stress: Not on file  Social Connections: Not on file  Intimate Partner Violence: Not on file    FAMILY HISTORY: Family History  Problem Relation Age of Onset   Heart attack Mother    Cirrhosis Father    Pancreatic cancer Brother    Diabetes Brother    Diabetes Daughter        medication induced for cancer treatments   Cancer Daughter        breast, brain    ALLERGIES:  has No Known Allergies.  MEDICATIONS:  Current Outpatient Medications  Medication Sig Dispense Refill   acetaminophen (TYLENOL) 325 MG tablet Take 325 mg by mouth every 6 (six) hours as needed.     albuterol (PROVENTIL  HFA;VENTOLIN HFA) 108 (90 Base) MCG/ACT inhaler      ALPRAZolam (XANAX) 0.25 MG tablet Take by mouth 2 (two) times daily as needed.     amLODipine-benazepril (LOTREL) 5-20 MG capsule Take 1 capsule by mouth daily.      apixaban (ELIQUIS) 2.5 MG TABS tablet Take 2.5 mg by mouth 2 (two) times daily.  atorvastatin (LIPITOR) 80 MG tablet Take by mouth.     benazepril (LOTENSIN) 20 MG tablet Take 20 mg by mouth daily.     Butalbital-APAP-Caffeine 50-300-40 MG CAPS Take 1 capsule every four to six hours as needed     Cholecalciferol 1.25 MG (50000 UT) capsule Take by mouth.      colchicine 0.6 MG tablet Take by mouth.     ezetimibe (ZETIA) 10 MG tablet Take 1 tablet by mouth 1 day or 1 dose.      ferrous sulfate 325 (65 FE) MG EC tablet Take 1 tablet (325 mg total) by mouth daily with breakfast. 90 tablet 1   fexofenadine (ALLEGRA) 180 MG tablet Take by mouth.     fluticasone (FLONASE) 50 MCG/ACT nasal spray Place 2 sprays into both nostrils daily as needed for rhinitis.      fluticasone furoate-vilanterol (BREO ELLIPTA) 100-25 MCG/INH AEPB Inhale 1 puff into the lungs daily.      furosemide (LASIX) 20 MG tablet Take 20 mg by mouth daily.     gabapentin (NEURONTIN) 400 MG capsule Take by mouth.     glucose blood (ONETOUCH ULTRA) test strip      HUMIRA PEN 40 MG/0.4ML PNKT      hydroxychloroquine (PLAQUENIL) 200 MG tablet Take by mouth.     isosorbide mononitrate (IMDUR) 30 MG 24 hr tablet Take 30 mg by mouth daily.     JARDIANCE 25 MG TABS tablet      loperamide (IMODIUM) 2 MG capsule Take 1 capsule (2 mg total) by mouth See admin instructions. With onset of diarrhea, take 46m followed by 262mevery 2 hours resolved. Maximum: 16 mg/day 120 capsule 0   metoprolol succinate (TOPROL-XL) 25 MG 24 hr tablet Take 25 mg by mouth daily.      pantoprazole (PROTONIX) 20 MG tablet Take 20 mg by mouth daily.     tamsulosin (FLOMAX) 0.4 MG CAPS capsule TAKE 1 CAPSULE BY MOUTH ONCE DAILY AFTER  SUPPER 90  capsule 0   tiZANidine (ZANAFLEX) 2 MG tablet Take 2 mg by mouth 3 (three) times daily.     traMADol-acetaminophen (ULTRACET) 37.5-325 MG tablet      triamcinolone cream (KENALOG) 0.1 % Apply 1 application topically 2 (two) times daily.     ondansetron (ZOFRAN) 8 MG tablet Take 1 tablet (8 mg total) 2 (two) times daily as needed by mouth (Nausea or vomiting). (Patient not taking: No sig reported) 30 tablet 1   No current facility-administered medications for this visit.      . Marland KitchenPHYSICAL EXAMINATION: ECOG PERFORMANCE STATUS: 1 - Symptomatic but completely ambulatory Vitals:   02/27/21 0936  BP: 107/78  Pulse: 76  Resp: 16  Temp: 98.1 F (36.7 C)  SpO2: 97%   Filed Weights   02/27/21 0936  Weight: 213 lb 1.6 oz (96.7 kg)   Physical Exam Constitutional:      Appearance: Normal appearance.  HENT:     Head: Normocephalic and atraumatic.  Eyes:     Pupils: Pupils are equal, round, and reactive to light.  Cardiovascular:     Rate and Rhythm: Normal rate and regular rhythm.     Heart sounds: Normal heart sounds. No murmur heard. Pulmonary:     Effort: Pulmonary effort is normal.     Breath sounds: Normal breath sounds. No wheezing.  Abdominal:     General: Bowel sounds are normal. There is no distension.     Palpations: Abdomen is soft.  Tenderness: There is no abdominal tenderness.  Musculoskeletal:        General: Normal range of motion.     Cervical back: Normal range of motion.  Skin:    General: Skin is warm and dry.     Findings: No rash.  Neurological:     Mental Status: He is alert and oriented to person, place, and time.     Gait: Gait is intact.  Psychiatric:        Mood and Affect: Mood and affect normal.        Cognition and Memory: Memory normal.        Judgment: Judgment normal.    LABORATORY DATA:  I have reviewed the data as listed Lab Results  Component Value Date   WBC 9.2 02/27/2021   HGB 16.1 02/27/2021   HCT 50.8 02/27/2021   MCV 90.7  02/27/2021   PLT 199 02/27/2021   Recent Labs    03/14/20 1115 06/18/20 1349 10/17/20 1242  NA 139 138 139  K 4.5 4.3 4.2  CL 106 105 107  CO2 _0 GLUCOSE 95 85 141*  BUN _1 CREATININE 1.37* 1.45* 1.63*  CALCIUM 8.6* 8.7* 8.8*  GFRNONAA 49* 49* 43*  PROT 8.0 7.5 7.9  ALBUMIN 3.5 3.5 3.8  AST _2 ALT _3 ALKPHOS 62 65 67  BILITOT 0.6 0.4 0.7    SPEP: no M spike Serum free light chain Kappa/Lamda ratio 13.04 UPEP  Free light chain Kappa/lamda ratio 162.43, M spike 181m/24 hour   Bone marrow biopsy 03/21/2017  Bone Marrow, Aspirate,Biopsy, and Clot, left iliac - HYPERCELLULAR BONE MARROW FOR AGE WITH TRILINEAGE HEMATOPOIESIS. - PLASMACYTOSIS (PLASMA CELLS 8%)  Karyotype: loss of Y chromosome Cytogenetic MDS FISH Panel negative.   Bone marrow 04/06/2017  Bone Marrow, Aspirate,Biopsy, and Clot, right iliac and core - HYPERCELLULAR BONE MARROW FOR AGE WITH PLASMA CELL NEOPLASM - TRILINEAGE HEMATOPOIESIS. - SEE COMMENT. PERIPHERAL BLOOD: - MICROCYTIC-HYPOCHROMIC ANEMIA. Diagnosis Note The bone marrow is hypercellular with trilineage hematopoiesis but with relative abundance of erythroid precursors and increased number of megakaryocytes with nonspecific changes. Significant dyspoiesis is not seen. Iron stores are present with no ring sideroblasts. The plasma cells are increased in number representing 8% of all cells in the aspirate although focal areas in the core biopsy show 10 to 20% as primarily seen by CD138 stain. In situ hybridization for kappa and lambda light chains show kappa light chain restriction consistent with plasma cell neoplasm. Correlation with cytogenetic and FISH studies is recommended. (BNS:ecj/gt 04/07/2017)  IMAGE STUDIES I have personally reviewed below image results.  03/15/2017 DG bone survey Met: Nonspecific lucent lesion in the distal right ulna measuring 15-16 mm, but otherwise normal bone mineralization for age  throughout the visible skeleton. A solitary lytic lesion in a distal extremity would be an unusual presentation of multiple myeloma, and I favor the distal right ulna lesion is benign. Recommend correlation with serum and urine protein electrophoresis.  04/14/2017 PET scan: 1. 2.1 cm hypermetabolic soft tissue lesion in the left heel, adjacent to the calcaneal tuberosity. 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 2. Mottled FDG accumulation diffusely in the marrow space without other frankly overt hypermetabolic bony lesion. 3. Coronary artery and thoracoabdominal aortic atherosclerosis with abdominal aortic stent graft visualized in situ. 4. Bilateral renal cysts and bilateral nonobstructing renal stones.   08/24/2017 PET scan 1. No hypermetabolic osseous lesions. No definite signs of multiple myeloma  on CT imaging. 2. Interval improvement in soft tissue activity along the medial aspect of the left calcaneal tuberosity, likely plantar fasciitis. 3. New small left pleural effusion. 4. Bilateral inguinal hernias containing small bowel on the right and sigmoid colon on the left. No evidence of incarceration or obstruction. 5. Otherwise stable incidental findings including diffuse atherosclerosis, bilateral renal cysts, emphysema and sigmoid diverticulosis.  ASSESSMENT & PLAN: Mr. Preusser is a 80 year old male who presents for follow-up for MGUS/multiple myeloma and iron deficiency anemia.  Iron deficiency anemia- Secondary to chronic blood loss due to AVM in small bowel.  Status post enteroscopy at [facility] for AVM ablation.  Labs from 02/27/2021 show hemoglobin of 16.1.  His last ferritin was from 12/22/2020 and was 20.  Iron saturations are 13%.  Iron supplements are on hold secondary to erythrocytosis due to sleep apnea and smoking.  Secondary erythrocytosis- Etiology smoking and sleep apnea.  Patient is trying to cut back on his cigarette smoking.  Labs from today show hemoglobin of 16.1 and  hematocrit of 50.8.  Hold off on phlebotomy at this time.  He is not symptomatic and has never had symptoms.  Denies any headaches or hyperviscosity symptoms.  Plan is to repeat lab work (H&H) every 3 weeks plus or minus a phlebotomy.  300 mL phlebotomy for hematocrit greater than 52.  Smoldering multiple myeloma versus active myeloma- PET scan showed hypermetabolic lesion that was related to myeloma.  He was treated with 4 cycles of RVD.  He is currently not on any maintenance therapy due to multiple comorbidities and borderline smoldering myeloma status.  Lab work from today show kappa lambda light chain ratio 5.04 which is a slight increase from previous 4.90.  Dr. Tasia Catchings was aware.  Repeat in 6 months.  History of prostate cancer- Currently not followed by urology.  PSA from 10/17/2020 was 0.05  1. Iron deficiency anemia, unspecified iron deficiency anemia type   2. Multiple myeloma in remission ([place])    Disposition- RTC every 3 weeks for lab work (H&H) and plus or minus a phlebotomy if hematocrit is greater than 52.  No phlebotomy today.  RTC in 9 weeks for labs (H&H, multiple myeloma panel and kappa lambda light chains, ferritin and iron panel) see Dr. Tasia Catchings with possible phlebotomy.  No orders of the defined types were placed in this encounter.  I spent 20 minutes dedicated to the care of this patient (face-to-face and non-face-to-face) on the date of the encounter to include what is described in the assessment and plan.  Omar Casa, NP 03/03/2021 9:54 AM

## 2021-02-27 NOTE — Progress Notes (Signed)
Pt c/o back pain but states this is chronic arthritis. No other concerns at this time.

## 2021-02-27 NOTE — Progress Notes (Signed)
  HCT is less then 52 today. Per MD parameters, no phlebotomy required today. Pt aware.

## 2021-03-02 LAB — KAPPA/LAMBDA LIGHT CHAINS
Kappa free light chain: 146.7 mg/L — ABNORMAL HIGH (ref 3.3–19.4)
Kappa, lambda light chain ratio: 5.04 — ABNORMAL HIGH (ref 0.26–1.65)
Lambda free light chains: 29.1 mg/L — ABNORMAL HIGH (ref 5.7–26.3)

## 2021-03-03 ENCOUNTER — Encounter: Payer: Self-pay | Admitting: Oncology

## 2021-03-03 LAB — MULTIPLE MYELOMA PANEL, SERUM
Albumin SerPl Elph-Mcnc: 3.7 g/dL (ref 2.9–4.4)
Albumin/Glob SerPl: 1.1 (ref 0.7–1.7)
Alpha 1: 0.2 g/dL (ref 0.0–0.4)
Alpha2 Glob SerPl Elph-Mcnc: 0.7 g/dL (ref 0.4–1.0)
B-Globulin SerPl Elph-Mcnc: 0.9 g/dL (ref 0.7–1.3)
Gamma Glob SerPl Elph-Mcnc: 1.6 g/dL (ref 0.4–1.8)
Globulin, Total: 3.4 g/dL (ref 2.2–3.9)
IgA: 237 mg/dL (ref 61–437)
IgG (Immunoglobin G), Serum: 1575 mg/dL (ref 603–1613)
IgM (Immunoglobulin M), Srm: 87 mg/dL (ref 15–143)
Total Protein ELP: 7.1 g/dL (ref 6.0–8.5)

## 2021-03-13 DIAGNOSIS — I209 Angina pectoris, unspecified: Secondary | ICD-10-CM | POA: Diagnosis not present

## 2021-03-16 DIAGNOSIS — I209 Angina pectoris, unspecified: Secondary | ICD-10-CM | POA: Diagnosis not present

## 2021-03-19 ENCOUNTER — Other Ambulatory Visit: Payer: Self-pay | Admitting: Oncology

## 2021-03-19 ENCOUNTER — Other Ambulatory Visit: Payer: Self-pay

## 2021-03-19 DIAGNOSIS — C9001 Multiple myeloma in remission: Secondary | ICD-10-CM

## 2021-03-19 DIAGNOSIS — E782 Mixed hyperlipidemia: Secondary | ICD-10-CM | POA: Diagnosis not present

## 2021-03-19 DIAGNOSIS — I2510 Atherosclerotic heart disease of native coronary artery without angina pectoris: Secondary | ICD-10-CM | POA: Diagnosis not present

## 2021-03-19 DIAGNOSIS — F17200 Nicotine dependence, unspecified, uncomplicated: Secondary | ICD-10-CM | POA: Diagnosis not present

## 2021-03-19 DIAGNOSIS — I10 Essential (primary) hypertension: Secondary | ICD-10-CM | POA: Diagnosis not present

## 2021-03-19 NOTE — Addendum Note (Signed)
Addended by: Vanice Sarah on: 03/19/2021 04:01 PM   Modules accepted: Orders

## 2021-03-20 ENCOUNTER — Other Ambulatory Visit: Payer: Self-pay | Admitting: Oncology

## 2021-03-20 ENCOUNTER — Other Ambulatory Visit: Payer: Self-pay

## 2021-03-20 ENCOUNTER — Inpatient Hospital Stay: Payer: Medicare PPO | Attending: Oncology

## 2021-03-20 ENCOUNTER — Inpatient Hospital Stay: Payer: Medicare PPO

## 2021-03-20 DIAGNOSIS — C9001 Multiple myeloma in remission: Secondary | ICD-10-CM | POA: Insufficient documentation

## 2021-03-20 DIAGNOSIS — D751 Secondary polycythemia: Secondary | ICD-10-CM

## 2021-03-20 LAB — HEMOGLOBIN AND HEMATOCRIT, BLOOD
HCT: 50.3 % (ref 39.0–52.0)
Hemoglobin: 16 g/dL (ref 13.0–17.0)

## 2021-03-20 NOTE — Progress Notes (Signed)
HGB 16, HCT 50.3. Per treatment protocols, phlebotomy needed if HCT>52. Patient informed of labs and no phlebotomy needed at this time. Patient verbalizes understanding and denies any further questions or concerns.

## 2021-03-22 DIAGNOSIS — C9001 Multiple myeloma in remission: Secondary | ICD-10-CM | POA: Diagnosis not present

## 2021-03-23 ENCOUNTER — Other Ambulatory Visit: Payer: Self-pay

## 2021-03-23 DIAGNOSIS — C9001 Multiple myeloma in remission: Secondary | ICD-10-CM

## 2021-03-24 LAB — IFE+PROTEIN ELECTRO, 24-HR UR
% BETA, Urine: 48.5 %
ALPHA 1 URINE: 5.8 %
Albumin, U: 19.3 %
Alpha 2, Urine: 7.8 %
GAMMA GLOBULIN URINE: 18.6 %
M-SPIKE %, Urine: 27.7 % — ABNORMAL HIGH
M-Spike, Mg/24 Hr: 38 mg/24 hr — ABNORMAL HIGH
Total Protein, Urine-Ur/day: 136 mg/24 hr (ref 30–150)
Total Protein, Urine: 8.8 mg/dL
Total Volume: 1550

## 2021-04-10 ENCOUNTER — Other Ambulatory Visit: Payer: Self-pay

## 2021-04-10 ENCOUNTER — Inpatient Hospital Stay: Payer: Medicare PPO | Attending: Oncology

## 2021-04-10 ENCOUNTER — Inpatient Hospital Stay: Payer: Medicare PPO

## 2021-04-10 DIAGNOSIS — C9001 Multiple myeloma in remission: Secondary | ICD-10-CM | POA: Insufficient documentation

## 2021-04-10 DIAGNOSIS — D751 Secondary polycythemia: Secondary | ICD-10-CM

## 2021-04-10 LAB — HEMOGLOBIN AND HEMATOCRIT, BLOOD
HCT: 51.3 % (ref 39.0–52.0)
Hemoglobin: 16.9 g/dL (ref 13.0–17.0)

## 2021-04-10 NOTE — Progress Notes (Signed)
HCT is less then 52. No phlebotomy required according therapeutic phlebotomy parameters. Patient made aware.

## 2021-04-29 ENCOUNTER — Other Ambulatory Visit: Payer: Self-pay | Admitting: Urology

## 2021-04-29 DIAGNOSIS — N401 Enlarged prostate with lower urinary tract symptoms: Secondary | ICD-10-CM

## 2021-05-07 ENCOUNTER — Inpatient Hospital Stay: Payer: Medicare PPO | Attending: Oncology

## 2021-05-07 ENCOUNTER — Encounter: Payer: Self-pay | Admitting: Oncology

## 2021-05-07 ENCOUNTER — Inpatient Hospital Stay (HOSPITAL_BASED_OUTPATIENT_CLINIC_OR_DEPARTMENT_OTHER): Payer: Medicare PPO | Admitting: Oncology

## 2021-05-07 ENCOUNTER — Other Ambulatory Visit: Payer: Self-pay

## 2021-05-07 VITALS — BP 91/73 | HR 73 | Temp 98.6°F | Wt 215.0 lb

## 2021-05-07 DIAGNOSIS — K922 Gastrointestinal hemorrhage, unspecified: Secondary | ICD-10-CM | POA: Diagnosis not present

## 2021-05-07 DIAGNOSIS — Z8546 Personal history of malignant neoplasm of prostate: Secondary | ICD-10-CM

## 2021-05-07 DIAGNOSIS — D500 Iron deficiency anemia secondary to blood loss (chronic): Secondary | ICD-10-CM | POA: Diagnosis not present

## 2021-05-07 DIAGNOSIS — G4730 Sleep apnea, unspecified: Secondary | ICD-10-CM | POA: Diagnosis not present

## 2021-05-07 DIAGNOSIS — F17210 Nicotine dependence, cigarettes, uncomplicated: Secondary | ICD-10-CM | POA: Insufficient documentation

## 2021-05-07 DIAGNOSIS — C9001 Multiple myeloma in remission: Secondary | ICD-10-CM

## 2021-05-07 DIAGNOSIS — D751 Secondary polycythemia: Secondary | ICD-10-CM

## 2021-05-07 DIAGNOSIS — Q2739 Arteriovenous malformation, other site: Secondary | ICD-10-CM | POA: Insufficient documentation

## 2021-05-07 LAB — CBC WITH DIFFERENTIAL/PLATELET
Abs Immature Granulocytes: 0.06 10*3/uL (ref 0.00–0.07)
Basophils Absolute: 0 10*3/uL (ref 0.0–0.1)
Basophils Relative: 0 %
Eosinophils Absolute: 0.3 10*3/uL (ref 0.0–0.5)
Eosinophils Relative: 4 %
HCT: 52.6 % — ABNORMAL HIGH (ref 39.0–52.0)
Hemoglobin: 17 g/dL (ref 13.0–17.0)
Immature Granulocytes: 1 %
Lymphocytes Relative: 15 %
Lymphs Abs: 1.4 10*3/uL (ref 0.7–4.0)
MCH: 28.3 pg (ref 26.0–34.0)
MCHC: 32.3 g/dL (ref 30.0–36.0)
MCV: 87.5 fL (ref 80.0–100.0)
Monocytes Absolute: 0.8 10*3/uL (ref 0.1–1.0)
Monocytes Relative: 9 %
Neutro Abs: 6.5 10*3/uL (ref 1.7–7.7)
Neutrophils Relative %: 71 %
Platelets: 175 10*3/uL (ref 150–400)
RBC: 6.01 MIL/uL — ABNORMAL HIGH (ref 4.22–5.81)
RDW: 19.5 % — ABNORMAL HIGH (ref 11.5–15.5)
WBC: 9.1 10*3/uL (ref 4.0–10.5)
nRBC: 0 % (ref 0.0–0.2)

## 2021-05-07 LAB — IRON AND TIBC
Iron: 48 ug/dL (ref 45–182)
Saturation Ratios: 13 % — ABNORMAL LOW (ref 17.9–39.5)
TIBC: 381 ug/dL (ref 250–450)
UIBC: 333 ug/dL

## 2021-05-07 LAB — FERRITIN: Ferritin: 18 ng/mL — ABNORMAL LOW (ref 24–336)

## 2021-05-07 NOTE — Progress Notes (Signed)
Hematology/Oncology Follow Up Note [phone] [phone]  Patient Care Team: Jodi Marble, MD as PCP - General (Internal Medicine) Bary Castilla, Forest Gleason, MD (General Surgery) Edrick Kins, MD as Rounding Team (Internal Medicine) Hillary Bow, MD (Inactive) as Consulting Physician (Internal Medicine) [name], MD as Surgeon (Gastroenterology) Isaias Cowman, MD as Consulting Physician (Cardiology) Earlie Server, MD as Consulting Physician (Oncology)  REFERRING PROVIDER: Dr.Lateef, [place]  REASON FOR VISIT Follow up for management of plasma cell dyscrasia.  And iron deficiency anemia.   HISTORY OF PRESENTING ILLNESS:  80 y.o.  male with PMH listed below who presents to follow up on the evaluation and management of his abnormal urine protein electrophoresis results. I reviewed the records from [facility], [place] and [place] was performed and HemOnc related medical problems are listed below.  He lives with a friend. He has 3 adult children.   1 Chronic Kidney disease, Stage III: patient follows up with Dr.Lateef.  2 Proteinuria:  02/18/2017: Albumin/Creatinine ratio was 7, Urine protein electrophoresis,Random Urine revealed M Spike 43.2%, total protein of 43.71m/dl,  01/05/2017 Albumin/Creatinine ratio was 455.2, Albumin 996.5, , 2.15.2018 Albumin/Creatinine ratio was 80.3, Albumin 73.3, Urine protein electrophoresis,Random Urine revealed M Spike 29.7%, total protein of 29.189mdl, 06/30/2016 Serum protein electrophoresis did not detect M spike.  Autoimmune disorder work up showed Negative anti dsDNA, RNP antibodies, smith antibody, Sjogren antibodies, anti Jo-1, positive antichromotin antibodies, positive ANA,  3 Anemia of chronic kidney disease:  4 Iron deficiency anemia: history of iron deficiency, ferritin was 7 on 08/11/2016. He had EGD and colonoscopy that were done this year which showed  duodenitis/esphagitis/diverticulosis/benigh polyps. # [facility]  5 Rheumatoid arthritis: he follows up with Dr.Kernodle and is on chronic MTX. He Is currently off MTX reports joint pains are controlled, not quite symptomatic.   Patient denies any persistent bone pain or any pain. He continue to feel lack of energy, persistent, not improved with resting or taking naps. He also has lost 25 pounds in the past year which was unintentional.   # Rheumatoid arthritis and currently is off MTX.  # It is ambiguous whether he has truly symptomatic multiple myeloma or a smoldering myeloma, given that anemia can be secondary to iron deficiency and CKD.  The hypermetabolic 2.1 cm hypermetabolic soft tissue lesion in the left heel, can reflect a Plasmacytoma,which should have been biopsied. However patient initially denies being on any blood thinner, later he was found to be on Plavix with pre biopsy questionnaire screening, and biopsy was held. Patient has to contact cardiologist to hold plavix for seven days before procedure. He feels frustrated about multiple workup and meanwhile he becomes more fatigues, with worsening of kidney function. Patient was reluctant about proceeding additional invasive procedures and wants to start treatment. Decision was made to start active MM treatment.   # [facility] bone [facility]. He is not considered to  good candidate for transplant. Patient cardiologist Dr. KaChancy Milroyad started patient on Lasix 20 mg daily. [facility] as patient was having dizziness and borderline blood pressure during his clinic visit there.  10/02/2018 He had follow-up left foot/ankle x-ray as a follow-up of his left heel lesion which was initially presented on PET scan and not detectable on follow-up PET scan.  Images were reviewed by me.  No suspicious acute or subacute  osseous abnormality  Current Treatment:  S/p RVD x 4 cycles tolerates well except cytopenia.  Revlimid 10 mg PO once per day on days 1 to 14 (dose reduced due to GFR) Revlimid renal dosing of 76m,  Velcade 1.3 mg/m2 IV once per day on days 1,  8, 15 Dexamethasone 20 mg PO once per day on days 1, 8, 15 given patient's diabetes Given patient's multiple comorbidity and the side effects of cytopenia during his RVD treatment and the uncertainty of this is really a site of plasmacytoma or not, I recommend not to continue him on maintenance Revlimid.   # repeat small bowel capsule study on 02/16/2019 Which showed multiple AVMs seen in the mid jejunum proximal to the site of prior tattoo. # [facility]19/2019 double-balloon enteroscopy with findings of 5 AVMs in the jejunum and 2 AVMs in the duodenum and AVMs were ablated with APC.  Tattoo was seen in the area. If further bleeding, recommend to consider treatment with octreotide. Patient takes Protonix daily.    INTERVAL HISTORY Patient presents for follow-up of management of multiple myeloma/smoldering multiple myeloma and iron deficiency, secondary erythrocytosis. Patient continues to smoke. He has CPAP machine and can maximally tolerated few hours per night.  Denies any fever, chills, unintentional weight loss, shortness of breath, chest pain.  Review of Systems  Constitutional:  Positive for fatigue. Negative for appetite change, chills, fever and unexpected weight change.  HENT:   Negative for hearing loss and voice change.   Eyes:  Negative for eye problems and icterus.  Respiratory:  Negative for chest tightness, cough and shortness of breath.   Cardiovascular:  Negative for chest pain and leg swelling.  Gastrointestinal:  Negative for abdominal distention, abdominal pain and diarrhea.  Endocrine: Negative for hot flashes.  Genitourinary:  Negative for difficulty urinating, dysuria and frequency.    Musculoskeletal:  Positive for arthralgias.  Skin:  Negative for itching and rash.  Neurological:  Negative for light-headedness and numbness.  Hematological:  Negative for adenopathy. Does not bruise/bleed easily.  Psychiatric/Behavioral:  Negative for confusion.     MEDICAL HISTORY:  Past Medical History:  Diagnosis Date   Anxiety    Arthritis    rheumatoid   Atrial fibrillation (HCC)    hx of   Cancer (HCC)    Chronic kidney disease    Colon polyp 07-07-15   TUBULAR ADENOMA WITH AT LEAST HIGH-GRADE / Dr RRayann Heman  Diabetes mellitus without complication ([place]    Dysrhythmia    bradycardia....Marland KitchenMarland Kitchen to 1 heart block   GERD (gastroesophageal reflux disease)    Heart murmur    patient unaware of history of murmur   Hypercholesterolemia    Hypertension    Presence of permanent cardiac pacemaker    [place], 01/30[place]14   Gleason 3+4=7, volume 46.6 cc   [place]umatoid [place]    S/P radiation therapy  04/03/2014 through 06/04/2014                                                      Prostate 7800 cGy in 40 sessions                           Sleep apnea    uses  cpap but it is not in the hospital with him    SURGICAL HISTORY: Past Surgical History:  Procedure Laterality Date   ABDOMINAL AORTIC ANEURYSM REPAIR  11/2013   COLON SURGERY  March 2017   Right hemicolectomy for tubulovillous adenoma with high-grade dysplasia.   COLONOSCOPY WITH PROPOFOL N/A 07/07/2015   Procedure: COLONOSCOPY WITH PROPOFOL;  Surgeon: Josefine Class, MD;  Location: [facility] ENDOSCOPY;  Service: Endoscopy;  Laterality: N/A;   COLONOSCOPY WITH PROPOFOL N/A 10/28/2016   Procedure: COLONOSCOPY WITH PROPOFOL;  Surgeon: [name], MD;  Location: [facility] ENDOSCOPY;  Service: Endoscopy;  Laterality: N/A;   ESOPHAGOGASTRODUODENOSCOPY (EGD) WITH PROPOFOL N/A 10/28/2016   Procedure: ESOPHAGOGASTRODUODENOSCOPY (EGD) WITH PROPOFOL;  Surgeon: [name], MD;  Location: [facility] ENDOSCOPY;  Service:  Endoscopy;  Laterality: N/A;   GIVENS CAPSULE STUDY N/A 07/25/2017   Procedure: GIVENS CAPSULE STUDY;  Surgeon: [name], MD;  Location: [facility] ENDOSCOPY;  Service: Gastroenterology;  Laterality: N/A;   GIVENS CAPSULE STUDY N/A 01/29/2019   Procedure: GIVENS CAPSULE STUDY;  Surgeon: [name], MD;  Location: [facility] ENDOSCOPY;  Service: Gastroenterology;  Laterality: N/A;   LAPAROSCOPIC RIGHT COLECTOMY Right 08/08/2015   Procedure: LAPAROSCOPIC RIGHT COLECTOMY;  Surgeon: Robert Bellow, MD;  Location: [facility] ORS;  Service: General;  Laterality: Right;   NASAL SINUS SURGERY     PACEMAKER INSERTION Left 08/26/2016   Procedure: INSERTION PACEMAKER;  Surgeon: Isaias Cowman, MD;  Location: [facility] ORS;  Service: Cardiovascular;  Laterality: Left;   PROSTATE BIOPSY  01/01/13, 01/30/14   Gleason 3+3=6, vol 46.6 cc   TONSILLECTOMY     uvula surgery     for sleep apnea    SOCIAL HISTORY: Social History   Socioeconomic History   Marital status: Divorced    Spouse name: Not on file   Number of children: Not on file   Years of education: Not on file   Highest education level: Not on file  Occupational History   Not on file  Tobacco Use   Smoking status: Every Day    Packs/day: 1.00    Years: 60.00    Pack years: 60.00    Types: Cigarettes   Smokeless tobacco: Never   Tobacco comments:    DOWN TO 1/2 PPD  Vaping Use   Vaping Use: Never used  Substance and Sexual Activity   Alcohol use: Yes    Alcohol/week: 1.0 standard drink    Types: 1 Cans of beer per week    Comment: occasionally   Drug use: No   Sexual activity: Not Currently  Other Topics Concern   Not on file  Social History Narrative   Not on file   Social Determinants of Health   Financial Resource Strain: Not on file  Food Insecurity: Not on file  Transportation Needs: Not on file  Physical Activity: Not on file  Stress: Not on file  Social Connections: Not on file  Intimate Partner Violence: Not on file     FAMILY HISTORY: Family History  Problem Relation Age of Onset   Heart attack Mother    Cirrhosis Father    Pancreatic cancer Brother    Diabetes Brother    Diabetes Daughter        medication induced for cancer treatments   Cancer Daughter        breast, brain    ALLERGIES:  has No Known Allergies.  MEDICATIONS:  Current Outpatient Medications  Medication Sig Dispense Refill   acetaminophen (TYLENOL) 325 MG tablet Take 325 mg  by mouth every 6 (six) hours as needed.     albuterol (PROVENTIL HFA;VENTOLIN HFA) 108 (90 Base) MCG/ACT inhaler      ALPRAZolam (XANAX) 0.25 MG tablet Take by mouth 2 (two) times daily as needed.     amLODipine-benazepril (LOTREL) 5-20 MG capsule Take 1 capsule by mouth daily.      apixaban (ELIQUIS) 2.5 MG TABS tablet Take 2.5 mg by mouth 2 (two) times daily.      atorvastatin (LIPITOR) 80 MG tablet Take by mouth.     benazepril (LOTENSIN) 20 MG tablet Take 20 mg by mouth daily.     Butalbital-APAP-Caffeine 50-300-40 MG CAPS Take 1 capsule every four to six hours as needed     Cholecalciferol 1.25 MG (50000 UT) capsule Take by mouth.      colchicine 0.6 MG tablet Take by mouth.     ezetimibe (ZETIA) 10 MG tablet Take 1 tablet by mouth 1 day or 1 dose.      ferrous sulfate 325 (65 FE) MG EC tablet Take 1 tablet (325 mg total) by mouth daily with breakfast. 90 tablet 1   fexofenadine (ALLEGRA) 180 MG tablet Take by mouth.     fluticasone (FLONASE) 50 MCG/ACT nasal spray Place 2 sprays into both nostrils daily as needed for rhinitis.      fluticasone furoate-vilanterol (BREO ELLIPTA) 100-25 MCG/INH AEPB Inhale 1 puff into the lungs daily.      furosemide (LASIX) 20 MG tablet Take 20 mg by mouth daily.     gabapentin (NEURONTIN) 400 MG capsule Take by mouth.     glucose blood (ONETOUCH ULTRA) test strip      HUMIRA PEN 40 MG/0.4ML PNKT      isosorbide mononitrate (IMDUR) 30 MG 24 hr tablet Take 30 mg by mouth daily.     JARDIANCE 25 MG TABS tablet       loperamide (IMODIUM) 2 MG capsule Take 1 capsule (2 mg total) by mouth See admin instructions. With onset of diarrhea, take $RemoveBefore'4mg'kfImAITChPMNo$  followed by $RemoveBef'2mg'jaEQqyNrso$  every 2 hours resolved. Maximum: 16 mg/day 120 capsule 0   pantoprazole (PROTONIX) 20 MG tablet Take 20 mg by mouth daily.     tamsulosin (FLOMAX) 0.4 MG CAPS capsule TAKE 1 CAPSULE BY MOUTH ONCE DAILY AFTER  SUPPER 90 capsule 0   tiZANidine (ZANAFLEX) 2 MG tablet Take 2 mg by mouth 3 (three) times daily.     traMADol-acetaminophen (ULTRACET) 37.5-325 MG tablet      triamcinolone cream (KENALOG) 0.1 % Apply 1 application topically 2 (two) times daily.     hydroxychloroquine (PLAQUENIL) 200 MG tablet Take by mouth.     metoprolol succinate (TOPROL-XL) 25 MG 24 hr tablet Take 25 mg by mouth daily.  (Patient not taking: Reported on 05/07/2021)     ondansetron (ZOFRAN) 8 MG tablet Take 1 tablet (8 mg total) 2 (two) times daily as needed by mouth (Nausea or vomiting). (Patient not taking: Reported on 10/24/2020) 30 tablet 1   No current facility-administered medications for this visit.      Marland Kitchen  PHYSICAL EXAMINATION: ECOG PERFORMANCE STATUS: 1 - Symptomatic but completely ambulatory Vitals:   05/07/21 1354  BP: 91/73  Pulse: 73  Temp: 98.6 F (37 C)   Filed Weights   05/07/21 1354  Weight: 215 lb (97.5 kg)   Physical Exam Constitutional:      General: He is not in acute distress.    Appearance: He is not diaphoretic.  HENT:     Head:  Normocephalic and atraumatic.     Nose: Nose normal.     Mouth/Throat:     Pharynx: No oropharyngeal exudate.  Eyes:     General: No scleral icterus.       Right eye: No discharge.        Left eye: No discharge.     Pupils: Pupils are equal, round, and reactive to light.  Neck:     Vascular: No JVD.  Cardiovascular:     Rate and Rhythm: Normal rate and regular rhythm.     Heart sounds: Normal heart sounds. No murmur heard.   No friction rub.  Pulmonary:     Effort: Pulmonary effort is normal. No  respiratory distress.     Breath sounds: Normal breath sounds. No wheezing or rales.  Chest:     Chest wall: No tenderness.  Abdominal:     General: Bowel sounds are normal. There is no distension.     Palpations: Abdomen is soft. There is no mass.     Tenderness: There is no abdominal tenderness.  Musculoskeletal:        General: No tenderness. Normal range of motion.     Cervical back: Normal range of motion and neck supple.  Lymphadenopathy:     Cervical: No cervical adenopathy.  Skin:    General: Skin is warm and dry.     Findings: No erythema.     Comments: Hypopigmentation of skin  Neurological:     Mental Status: He is alert and oriented to person, place, and time.  Psychiatric:        Mood and Affect: Affect normal.    LABORATORY DATA:  I have reviewed the data as listed Lab Results  Component Value Date   WBC 9.1 05/07/2021   HGB 17.0 05/07/2021   HCT 52.6 (H) 05/07/2021   MCV 87.5 05/07/2021   PLT 175 05/07/2021   Recent Labs    06/18/20 1349 10/17/20 1242  NA 138 139  K 4.3 4.2  CL 105 107  CO2 27 24  GLUCOSE 85 141*  BUN 12 15  CREATININE 1.45* 1.63*  CALCIUM 8.7* 8.8*  GFRNONAA 49* 43*  PROT 7.5 7.9  ALBUMIN 3.5 3.8  AST 20 24  ALT 22 24  ALKPHOS 65 67  BILITOT 0.4 0.7     SPEP: no M spike Serum free light chain Kappa/Lamda ratio 13.04 UPEP  Free light chain Kappa/lamda ratio 162.43, M spike $Remov'198mg'HVZULV$ /24 hour   Bone marrow biopsy 03/21/2017  Bone Marrow, Aspirate,Biopsy, and Clot, left iliac - HYPERCELLULAR BONE MARROW FOR AGE WITH TRILINEAGE HEMATOPOIESIS. - PLASMACYTOSIS (PLASMA CELLS 8%)  Karyotype: loss of Y chromosome Cytogenetic MDS FISH Panel negative.   Bone marrow 04/06/2017  Bone Marrow, Aspirate,Biopsy, and Clot, right iliac and core - HYPERCELLULAR BONE MARROW FOR AGE WITH PLASMA CELL NEOPLASM - TRILINEAGE HEMATOPOIESIS. - SEE COMMENT. PERIPHERAL BLOOD: - MICROCYTIC-HYPOCHROMIC ANEMIA. Diagnosis Note The bone marrow is  hypercellular with trilineage hematopoiesis but with relative abundance of erythroid precursors and increased number of megakaryocytes with nonspecific changes. Significant dyspoiesis is not seen. Iron stores are present with no ring sideroblasts. The plasma cells are increased in number representing 8% of all cells in the aspirate although focal areas in the core biopsy show 10 to 20% as primarily seen by CD138 stain. In situ hybridization for kappa and lambda light chains show kappa light chain restriction consistent with plasma cell neoplasm. Correlation with cytogenetic and FISH studies is recommended. (BNS:ecj/gt 04/07/2017)  IMAGE STUDIES  I have personally reviewed below image results.  03/15/2017 DG bone survey Met: Nonspecific lucent lesion in the distal right ulna measuring 15-16 mm, but otherwise normal bone mineralization for age throughout the visible skeleton. A solitary lytic lesion in a distal extremity would be an unusual presentation of multiple myeloma, and I favor the distal right ulna lesion is benign. Recommend correlation with serum and urine protein electrophoresis.  04/14/2017 PET scan: 1. 2.1 cm hypermetabolic soft tissue lesion in the left heel, adjacent to the calcaneal tuberosity. 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 2. Mottled FDG accumulation diffusely in the marrow space without other frankly overt hypermetabolic bony lesion. 3. Coronary artery and thoracoabdominal aortic atherosclerosis with abdominal aortic stent graft visualized in situ. 4. Bilateral renal cysts and bilateral nonobstructing renal stones.   08/24/2017 PET scan 1. No hypermetabolic osseous lesions. No definite signs of multiple myeloma on CT imaging. 2. Interval improvement in soft tissue activity along the medial aspect of the left calcaneal tuberosity, likely plantar fasciitis. 3. New small left pleural effusion. 4. Bilateral inguinal hernias containing small bowel on the right and sigmoid  colon on the left. No evidence of incarceration or obstruction. 5. Otherwise stable incidental findings including diffuse atherosclerosis, bilateral renal cysts, emphysema and sigmoid diverticulosis.  ASSESSMENT & PLAN:  1. Multiple myeloma in remission ([place])   2. Erythrocytosis   3. History of prostate cancer      #Secondary erythrocytosis, likely due to smoking and sleep apnea Hematocrit is 52.6.  Borderline.  I will hold off phlebotomy today given that hemoglobin is normal at 17. Continue H&H monthly +/- Phlebotomy 300cc  if Hct >-55   #Iron deficiency anemia secondary to chronic blood loss due to multiple AVM in small bowels. Status post enteroscopy at [facility] for AVM ablation. Labs reviewed and discussed with patient Patient has no anemia.  Iron deficiency secondary to erythrocytosis.   #Smoldering multiple myeloma/active myeloma if previous hypermetabolic lesion on PET scan was related to myeloma. Patient was treated with 4 cycles of RVD.  He is not currently on any maintenance therapy due to multiple comorbidities, borderline smoldering multiple myeloma status.   24-hour urine showed Bence-Jones protein. Monitor multiple myeloma panel every 6 months.  Labs are pending today.   #History of prostate cancer, 10/17/2020, PSA point 0.05.  I will check PSA annually.   Follow-up  Continue H&H monthly +/- Phlebotomy if Hct >-55 Lab MD +/- phlebotomy in 3 months.    Orders Placed This Encounter  Procedures   CBC with Differential/Platelet    Standing Status:   Future    Standing Expiration Date:   05/07/2022   Comprehensive metabolic panel    Standing Status:   Future    Standing Expiration Date:   05/07/2022   PSA    Standing Status:   Future    Standing Expiration Date:   05/07/2022   Multiple Myeloma Panel (SPEP&IFE w/QIG)    Standing Status:   Future    Standing Expiration Date:   05/07/2022   Kappa/lambda light chains    Standing Status:   Future    Standing Expiration  Date:   05/07/2022   Hemoglobin and hematocrit, blood    Standing Status:   Future    Standing Expiration Date:   05/07/2022   Hemoglobin and hematocrit, blood    Standing Status:   Future    Standing Expiration Date:   05/07/2022    Earlie Server, MD, PhD Hematology Oncology [place] at  [facility] Pager- [phone] 05/07/2021

## 2021-05-07 NOTE — Progress Notes (Signed)
Per Dr. Tasia Catchings, phlebotomy not needed today.

## 2021-05-08 LAB — KAPPA/LAMBDA LIGHT CHAINS
Kappa free light chain: 151.9 mg/L — ABNORMAL HIGH (ref 3.3–19.4)
Kappa, lambda light chain ratio: 5.86 — ABNORMAL HIGH (ref 0.26–1.65)
Lambda free light chains: 25.9 mg/L (ref 5.7–26.3)

## 2021-05-11 ENCOUNTER — Ambulatory Visit: Payer: Medicare PPO | Admitting: Podiatry

## 2021-05-11 ENCOUNTER — Encounter: Payer: Self-pay | Admitting: Podiatry

## 2021-05-11 ENCOUNTER — Other Ambulatory Visit: Payer: Self-pay

## 2021-05-11 DIAGNOSIS — D689 Coagulation defect, unspecified: Secondary | ICD-10-CM

## 2021-05-11 DIAGNOSIS — B351 Tinea unguium: Secondary | ICD-10-CM | POA: Diagnosis not present

## 2021-05-11 DIAGNOSIS — M79609 Pain in unspecified limb: Secondary | ICD-10-CM | POA: Diagnosis not present

## 2021-05-11 DIAGNOSIS — I739 Peripheral vascular disease, unspecified: Secondary | ICD-10-CM

## 2021-05-11 DIAGNOSIS — E1140 Type 2 diabetes mellitus with diabetic neuropathy, unspecified: Secondary | ICD-10-CM

## 2021-05-11 NOTE — Progress Notes (Signed)
This patient returns to my office for at risk foot care.  This patient requires this care by a professional since this patient will be at risk due to having PAD and diabetes. And coagulation defect.  Patient is taking eliquiss.  This patient is unable to cut nails himself since the patient cannot reach his nails.These nails are painful walking and wearing shoes.  This patient presents for at risk foot care today.  General Appearance  Alert, conversant and in no acute stress.  Vascular  Dorsalis pedis and posterior tibial  pulses are not  palpable  bilaterally.  Capillary return is within normal limits  bilaterally. Temperature is within normal limits  bilaterally.  Neurologic  Senn-Weinstein monofilament wire test within normal limits  bilaterally. Muscle power within normal limits bilaterally.  Nails Thick disfigured discolored nails with subungual debris  from hallux to fifth toes bilaterally. No evidence of bacterial infection or drainage bilaterally.  Orthopedic  No limitations of motion  feet .  No crepitus or effusions noted.  No bony pathology or digital deformities noted.  HAV wuth hammer toe  B/L.  Rigid contracted second digit right foot.  Skin  normotropic skin with no porokeratosis noted bilaterally.  No signs of infections or ulcers noted.     Onychomycosis  Pain in right toes  Pain in left toes  Consent was obtained for treatment procedures.   Mechanical debridement of nails 1-5  bilaterally performed with a nail nipper.  Filed with dremel without incident.    Return office visit    10 weeks                 Told patient to return for periodic foot care and evaluation due to potential at risk complications.   Gardiner Barefoot DPM

## 2021-05-12 LAB — MULTIPLE MYELOMA PANEL, SERUM
Albumin SerPl Elph-Mcnc: 3.5 g/dL (ref 2.9–4.4)
Albumin/Glob SerPl: 1 (ref 0.7–1.7)
Alpha 1: 0.2 g/dL (ref 0.0–0.4)
Alpha2 Glob SerPl Elph-Mcnc: 0.7 g/dL (ref 0.4–1.0)
B-Globulin SerPl Elph-Mcnc: 1 g/dL (ref 0.7–1.3)
Gamma Glob SerPl Elph-Mcnc: 1.7 g/dL (ref 0.4–1.8)
Globulin, Total: 3.6 g/dL (ref 2.2–3.9)
IgA: 251 mg/dL (ref 61–437)
IgG (Immunoglobin G), Serum: 1875 mg/dL — ABNORMAL HIGH (ref 603–1613)
IgM (Immunoglobulin M), Srm: 107 mg/dL (ref 15–143)
Total Protein ELP: 7.1 g/dL (ref 6.0–8.5)

## 2021-05-21 DIAGNOSIS — E119 Type 2 diabetes mellitus without complications: Secondary | ICD-10-CM | POA: Diagnosis not present

## 2021-05-21 DIAGNOSIS — N181 Chronic kidney disease, stage 1: Secondary | ICD-10-CM | POA: Diagnosis not present

## 2021-05-26 DIAGNOSIS — F17200 Nicotine dependence, unspecified, uncomplicated: Secondary | ICD-10-CM | POA: Diagnosis not present

## 2021-05-26 DIAGNOSIS — M150 Primary generalized (osteo)arthritis: Secondary | ICD-10-CM | POA: Diagnosis not present

## 2021-05-26 DIAGNOSIS — N181 Chronic kidney disease, stage 1: Secondary | ICD-10-CM | POA: Diagnosis not present

## 2021-05-26 DIAGNOSIS — J449 Chronic obstructive pulmonary disease, unspecified: Secondary | ICD-10-CM | POA: Diagnosis not present

## 2021-05-26 DIAGNOSIS — M109 Gout, unspecified: Secondary | ICD-10-CM | POA: Diagnosis not present

## 2021-05-26 DIAGNOSIS — I10 Essential (primary) hypertension: Secondary | ICD-10-CM | POA: Diagnosis not present

## 2021-05-26 DIAGNOSIS — Z1211 Encounter for screening for malignant neoplasm of colon: Secondary | ICD-10-CM | POA: Diagnosis not present

## 2021-05-26 DIAGNOSIS — J309 Allergic rhinitis, unspecified: Secondary | ICD-10-CM | POA: Diagnosis not present

## 2021-05-26 DIAGNOSIS — E119 Type 2 diabetes mellitus without complications: Secondary | ICD-10-CM | POA: Diagnosis not present

## 2021-05-26 DIAGNOSIS — F411 Generalized anxiety disorder: Secondary | ICD-10-CM | POA: Diagnosis not present

## 2021-06-04 ENCOUNTER — Other Ambulatory Visit: Payer: Self-pay

## 2021-06-04 ENCOUNTER — Inpatient Hospital Stay: Payer: Medicare PPO

## 2021-06-04 DIAGNOSIS — C9001 Multiple myeloma in remission: Secondary | ICD-10-CM | POA: Diagnosis not present

## 2021-06-04 DIAGNOSIS — Q2739 Arteriovenous malformation, other site: Secondary | ICD-10-CM | POA: Diagnosis not present

## 2021-06-04 DIAGNOSIS — D500 Iron deficiency anemia secondary to blood loss (chronic): Secondary | ICD-10-CM | POA: Diagnosis not present

## 2021-06-04 LAB — HEMOGLOBIN AND HEMATOCRIT, BLOOD
HCT: 52.5 % — ABNORMAL HIGH (ref 39.0–52.0)
Hemoglobin: 17.1 g/dL — ABNORMAL HIGH (ref 13.0–17.0)

## 2021-06-04 NOTE — Progress Notes (Signed)
HCT 52.5 today. Per phlebotomy parameters no phlebotomy is required. Informed patient of above.

## 2021-06-19 ENCOUNTER — Other Ambulatory Visit: Payer: Self-pay

## 2021-06-19 ENCOUNTER — Ambulatory Visit: Payer: Medicare PPO

## 2021-06-19 DIAGNOSIS — E1140 Type 2 diabetes mellitus with diabetic neuropathy, unspecified: Secondary | ICD-10-CM

## 2021-06-19 DIAGNOSIS — I10 Essential (primary) hypertension: Secondary | ICD-10-CM | POA: Diagnosis not present

## 2021-06-19 DIAGNOSIS — M2011 Hallux valgus (acquired), right foot: Secondary | ICD-10-CM

## 2021-06-19 DIAGNOSIS — M2012 Hallux valgus (acquired), left foot: Secondary | ICD-10-CM

## 2021-06-19 DIAGNOSIS — E119 Type 2 diabetes mellitus without complications: Secondary | ICD-10-CM | POA: Diagnosis not present

## 2021-06-19 DIAGNOSIS — I739 Peripheral vascular disease, unspecified: Secondary | ICD-10-CM

## 2021-06-19 NOTE — Progress Notes (Signed)
SITUATION Reason for Visit: Dispensation of Diabetic Certification Paperwork Patient Report:  Patient/Caregiver understands their treating physician must complete the forms in order to be evaluated for diabetic shoes.  OBJECTIVE DATA Relevant Diagnoses Provided by Podiatrist:   ICD-10-CM   1. Type 2 diabetes mellitus with diabetic neuropathy, unspecified whether long term insulin use (HCC)  E11.40     2. PVD (peripheral vascular disease) (HCC)  I73.9     3. Hav (hallux abducto valgus), left  M20.12     4. Hav (hallux abducto valgus), right  M20.11       Current or Previous Devices: Various, patient was not using them  Treating Physician:  Jodi Marble, MD  ACTIONS PERFORMED Discussed with Patient the Medicare mandated diabetic certification paperwork. Confirmed patient's treating physician and diagnoses. Provided paperwork for Patient to present to patient's treating physician. Patient understands all instructions. All questions were answered and concerns were addressed. Patient does not want to proceed with casting until he can speak with his primary care physician.  PLAN Patient is to present paperwork to treating physician and return the paperwork to [facility] to be scheduled for evaluation for diabetic shoes and insoles.

## 2021-06-22 DIAGNOSIS — E782 Mixed hyperlipidemia: Secondary | ICD-10-CM | POA: Diagnosis not present

## 2021-06-22 DIAGNOSIS — I340 Nonrheumatic mitral (valve) insufficiency: Secondary | ICD-10-CM | POA: Diagnosis not present

## 2021-06-22 DIAGNOSIS — R079 Chest pain, unspecified: Secondary | ICD-10-CM | POA: Diagnosis not present

## 2021-06-22 DIAGNOSIS — F17200 Nicotine dependence, unspecified, uncomplicated: Secondary | ICD-10-CM | POA: Diagnosis not present

## 2021-06-22 DIAGNOSIS — I351 Nonrheumatic aortic (valve) insufficiency: Secondary | ICD-10-CM | POA: Diagnosis not present

## 2021-06-22 DIAGNOSIS — I10 Essential (primary) hypertension: Secondary | ICD-10-CM | POA: Diagnosis not present

## 2021-06-22 DIAGNOSIS — I2510 Atherosclerotic heart disease of native coronary artery without angina pectoris: Secondary | ICD-10-CM | POA: Diagnosis not present

## 2021-07-02 ENCOUNTER — Other Ambulatory Visit: Payer: Self-pay

## 2021-07-02 ENCOUNTER — Inpatient Hospital Stay: Payer: Medicare PPO

## 2021-07-02 ENCOUNTER — Inpatient Hospital Stay: Payer: Medicare PPO | Attending: Oncology

## 2021-07-02 DIAGNOSIS — C9001 Multiple myeloma in remission: Secondary | ICD-10-CM | POA: Insufficient documentation

## 2021-07-02 DIAGNOSIS — D509 Iron deficiency anemia, unspecified: Secondary | ICD-10-CM | POA: Insufficient documentation

## 2021-07-02 LAB — HEMOGLOBIN AND HEMATOCRIT, BLOOD
HCT: 52.2 % — ABNORMAL HIGH (ref 39.0–52.0)
Hemoglobin: 17.2 g/dL — ABNORMAL HIGH (ref 13.0–17.0)

## 2021-07-02 NOTE — Progress Notes (Signed)
Per parameters and 05/07/21 MD office note Phlebotomy if HCT is greater than or equal to 55. Today's HCT 52.2, Per parameters no phlebotomy needed, pt denies any complaints at this time. Pt aware and agrees with plan. Pt stable at discharge.

## 2021-07-03 ENCOUNTER — Ambulatory Visit (INDEPENDENT_AMBULATORY_CARE_PROVIDER_SITE_OTHER): Payer: Medicare PPO | Admitting: Nurse Practitioner

## 2021-07-03 ENCOUNTER — Encounter (INDEPENDENT_AMBULATORY_CARE_PROVIDER_SITE_OTHER): Payer: Self-pay | Admitting: Nurse Practitioner

## 2021-07-03 ENCOUNTER — Ambulatory Visit (INDEPENDENT_AMBULATORY_CARE_PROVIDER_SITE_OTHER): Payer: Medicare PPO

## 2021-07-03 VITALS — BP 120/76 | HR 87 | Ht 71.0 in | Wt 230.0 lb

## 2021-07-03 DIAGNOSIS — F17200 Nicotine dependence, unspecified, uncomplicated: Secondary | ICD-10-CM

## 2021-07-03 DIAGNOSIS — I10 Essential (primary) hypertension: Secondary | ICD-10-CM | POA: Diagnosis not present

## 2021-07-03 DIAGNOSIS — I7143 Infrarenal abdominal aortic aneurysm, without rupture: Secondary | ICD-10-CM | POA: Diagnosis not present

## 2021-07-03 DIAGNOSIS — I714 Abdominal aortic aneurysm, without rupture, unspecified: Secondary | ICD-10-CM

## 2021-07-03 DIAGNOSIS — E785 Hyperlipidemia, unspecified: Secondary | ICD-10-CM | POA: Diagnosis not present

## 2021-07-04 ENCOUNTER — Encounter (INDEPENDENT_AMBULATORY_CARE_PROVIDER_SITE_OTHER): Payer: Self-pay | Admitting: Nurse Practitioner

## 2021-07-04 NOTE — Progress Notes (Signed)
Subjective:    Patient ID: Omar [name], male    DOB: 1941/04/19, 81 y.o.   MRN: [id number] Chief Complaint  Patient presents with   Follow-up    18yr evar     The patient returns to the office for surveillance of an abdominal aortic aneurysm status post stent graft placement in 2015.   Patient denies abdominal pain or back pain, no other abdominal complaints. No groin related complaints. No symptoms consistent with distal embolization No changes in claudication distance.   There have been no interval changes in his overall healthcare since his last visit.   Patient denies amaurosis fugax or TIA symptoms. There is no history of claudication or rest pain symptoms of the lower extremities. The patient denies angina or shortness of breath.   Duplex US of the aorta and iliac arteries shows a 3.9 AAA sac with no endoleak, decrease in the sac compared to the previous study.    Review of Systems  All other systems reviewed and are negative.     Objective:   Physical Exam Vitals reviewed.  HENT:     Head: Normocephalic.  Cardiovascular:     Rate and Rhythm: Normal rate.  Pulmonary:     Effort: Pulmonary effort is normal.  Skin:    General: Skin is warm and dry.  Neurological:     Mental Status: He is alert and oriented to person, place, and time.  Psychiatric:        Mood and Affect: Mood normal.        Behavior: Behavior normal.        Thought Content: Thought content normal.        Judgment: Judgment normal.    BP 120/76    Pulse 87    Ht 5\' 11"  (1.803 m)    Wt 230 lb (104.3 kg)    BMI 32.08 kg/m   Past Medical History:  Diagnosis Date   Anxiety    Arthritis    rheumatoid   Atrial fibrillation (HCC)    hx of   Cancer (HCC)    Chronic kidney disease    Colon polyp 07-07-15   TUBULAR ADENOMA WITH AT LEAST HIGH-GRADE / Dr Rayann Heman   Diabetes mellitus without complication ([place])    Dysrhythmia    bradycardia...Marland KitchenMarland Kitchen2 to 1 heart block   GERD (gastroesophageal reflux  disease)    Heart murmur    patient unaware of history of murmur   Hypercholesterolemia    Hypertension    Presence of permanent cardiac pacemaker    Prostate cancer ([place]) 01/01/13, 01/30/14   Gleason 3+4=7, volume 46.6 cc   Rheumatoid arthritis ([place])    S/P radiation therapy  04/03/2014 through 06/04/2014                                                      Prostate 7800 cGy in 40 sessions                           Sleep apnea    uses cpap but it is not in the hospital with him    Social History   Socioeconomic History   Marital status: Divorced    Spouse name: Not on file   Number of children: Not on file   Years of  education: Not on file   Highest education level: Not on file  Occupational History   Not on file  Tobacco Use   Smoking status: Every Day    Packs/day: 1.00    Years: 60.00    Pack years: 60.00    Types: Cigarettes   Smokeless tobacco: Never   Tobacco comments:    DOWN TO 1/2 PPD  Vaping Use   Vaping Use: Never used  Substance and Sexual Activity   Alcohol use: Yes    Alcohol/week: 1.0 standard drink    Types: 1 Cans of beer per week    Comment: occasionally   Drug use: No   Sexual activity: Not Currently  Other Topics Concern   Not on file  Social History Narrative   Not on file   Social Determinants of Health   Financial Resource Strain: Not on file  Food Insecurity: Not on file  Transportation Needs: Not on file  Physical Activity: Not on file  Stress: Not on file  Social Connections: Not on file  Intimate Partner Violence: Not on file    Past Surgical History:  Procedure Laterality Date   ABDOMINAL AORTIC ANEURYSM REPAIR  11/2013   COLON SURGERY  March 2017   Right hemicolectomy for tubulovillous adenoma with high-grade dysplasia.   COLONOSCOPY WITH PROPOFOL N/A 07/07/2015   Procedure: COLONOSCOPY WITH PROPOFOL;  Surgeon: Josefine Class, MD;  Location: [facility] ENDOSCOPY;  Service: Endoscopy;  Laterality: N/A;   COLONOSCOPY WITH  PROPOFOL N/A 10/28/2016   Procedure: COLONOSCOPY WITH PROPOFOL;  Surgeon: [name], MD;  Location: [facility] ENDOSCOPY;  Service: Endoscopy;  Laterality: N/A;   ESOPHAGOGASTRODUODENOSCOPY (EGD) WITH PROPOFOL N/A 10/28/2016   Procedure: ESOPHAGOGASTRODUODENOSCOPY (EGD) WITH PROPOFOL;  Surgeon: [name], MD;  Location: [facility] ENDOSCOPY;  Service: Endoscopy;  Laterality: N/A;   GIVENS CAPSULE STUDY N/A 07/25/2017   Procedure: GIVENS CAPSULE STUDY;  Surgeon: [name], MD;  Location: [facility] ENDOSCOPY;  Service: Gastroenterology;  Laterality: N/A;   GIVENS CAPSULE STUDY N/A 01/29/2019   Procedure: GIVENS CAPSULE STUDY;  Surgeon: [name], MD;  Location: [facility] ENDOSCOPY;  Service: Gastroenterology;  Laterality: N/A;   LAPAROSCOPIC RIGHT COLECTOMY Right 08/08/2015   Procedure: LAPAROSCOPIC RIGHT COLECTOMY;  Surgeon: Robert Bellow, MD;  Location: [facility] ORS;  Service: General;  Laterality: Right;   NASAL SINUS SURGERY     PACEMAKER INSERTION Left 08/26/2016   Procedure: INSERTION PACEMAKER;  Surgeon: Isaias Cowman, MD;  Location: [facility] ORS;  Service: Cardiovascular;  Laterality: Left;   PROSTATE BIOPSY  01/01/13, 01/30/14   Gleason 3+3=6, vol 46.6 cc   TONSILLECTOMY     uvula surgery     for sleep apnea    Family History  Problem Relation Age of Onset   Heart attack Mother    Cirrhosis Father    Pancreatic cancer Brother    Diabetes Brother    Diabetes Daughter        medication induced for cancer treatments   Cancer Daughter        breast, brain    No Known Allergies  CBC Latest Ref Rng & Units 07/02/2021 06/04/2021 05/07/2021  WBC 4.0 - 10.5 K/uL - - 9.1  Hemoglobin 13.0 - 17.0 g/dL 17.2(H) 17.1(H) 17.0  Hematocrit 39.0 - 52.0 % 52.2(H) 52.5(H) 52.6(H)  Platelets 150 - 400 K/uL - - 175      CMP     Component Value Date/Time   NA 139 10/17/2020 1242   NA 141 11/08/2013 0444   K 4.2  10/17/2020 1242   K 4.0 11/08/2013 0444   CL 107 10/17/2020 1242   CL 110 (H) 11/08/2013 0444    CO2 24 10/17/2020 1242   CO2 24 11/08/2013 0444   GLUCOSE 141 (H) 10/17/2020 1242   GLUCOSE 110 (H) 11/08/2013 0444   BUN 15 10/17/2020 1242   BUN 13 11/08/2013 0444   CREATININE 1.63 (H) 10/17/2020 1242   CREATININE 1.02 11/08/2013 0444   CALCIUM 8.8 (L) 10/17/2020 1242   CALCIUM 8.1 (L) 11/08/2013 0444   PROT 7.9 10/17/2020 1242   PROT 6.4 11/08/2013 0444   ALBUMIN 3.8 10/17/2020 1242   ALBUMIN 3.0 (L) 11/08/2013 0444   AST 24 10/17/2020 1242   AST 22 11/08/2013 0444   ALT 24 10/17/2020 1242   ALT 27 11/08/2013 0444   ALKPHOS 67 10/17/2020 1242   ALKPHOS 71 11/08/2013 0444   BILITOT 0.7 10/17/2020 1242   BILITOT 0.5 11/08/2013 0444   GFRNONAA 43 (L) 10/17/2020 1242   GFRNONAA >60 11/08/2013 0444   GFRAA 39 (L) 11/12/2019 1128   GFRAA >60 11/08/2013 0444     No results found.     Assessment & Plan:   1. Infrarenal abdominal aortic aneurysm (AAA) without rupture Recommend: Patient is status post successful endovascular repair of the AAA.   No further intervention is required at this time.   No endoleak is detected and the aneurysm sac is stable.  The patient will continue antiplatelet therapy as prescribed as well as aggressive management of hyperlipidemia. Exercise is again strongly encouraged.   However, endografts require continued surveillance with ultrasound or CT scan. This is mandatory to detect any changes that allow repressurization of the aneurysm sac.  The patient is informed that this would be asymptomatic.  The patient is reminded that lifelong routine surveillance is a necessity with an endograft. Patient will continue to follow-up at 12 month intervals with ultrasound of the aorta.   2. Primary hypertension Continue antihypertensive medications as already ordered, these medications have been reviewed and there are no changes at this time.   3. Hyperlipidemia, unspecified hyperlipidemia type Continue statin as ordered and reviewed, no changes at this  time   4. Tobacco use disorder Smoking cessation was discussed, 3-10 minutes spent on this topic specifically    Current Outpatient Medications on File Prior to Visit  Medication Sig Dispense Refill   acetaminophen (TYLENOL) 325 MG tablet Take 325 mg by mouth every 6 (six) hours as needed.     albuterol (PROVENTIL HFA;VENTOLIN HFA) 108 (90 Base) MCG/ACT inhaler      ALPRAZolam (XANAX) 0.25 MG tablet Take by mouth 2 (two) times daily as needed.     amLODipine-benazepril (LOTREL) 5-20 MG capsule Take 1 capsule by mouth daily.      apixaban (ELIQUIS) 2.5 MG TABS tablet Take 2.5 mg by mouth 2 (two) times daily.      atorvastatin (LIPITOR) 80 MG tablet Take by mouth.     benazepril (LOTENSIN) 20 MG tablet Take 20 mg by mouth daily.     Butalbital-APAP-Caffeine 50-300-40 MG CAPS Take 1 capsule every four to six hours as needed     Cholecalciferol 1.25 MG (50000 UT) capsule Take by mouth.      colchicine 0.6 MG tablet Take by mouth.     ezetimibe (ZETIA) 10 MG tablet Take 1 tablet by mouth 1 day or 1 dose.      ferrous sulfate 325 (65 FE) MG EC tablet Take 1 tablet (325 mg total) by  mouth daily with breakfast. 90 tablet 1   fexofenadine (ALLEGRA) 180 MG tablet Take by mouth.     fluticasone (FLONASE) 50 MCG/ACT nasal spray Place 2 sprays into both nostrils daily as needed for rhinitis.      fluticasone furoate-vilanterol (BREO ELLIPTA) 100-25 MCG/INH AEPB Inhale 1 puff into the lungs daily.      furosemide (LASIX) 20 MG tablet Take 20 mg by mouth daily.     gabapentin (NEURONTIN) 400 MG capsule Take by mouth.     glucose blood (ONETOUCH ULTRA) test strip      HUMIRA PEN 40 MG/0.4ML PNKT      isosorbide mononitrate (IMDUR) 30 MG 24 hr tablet Take 30 mg by mouth daily.     JARDIANCE 25 MG TABS tablet      loperamide (IMODIUM) 2 MG capsule Take 1 capsule (2 mg total) by mouth See admin instructions. With onset of diarrhea, take 4mg  followed by 2mg  every 2 hours resolved. Maximum: 16 mg/day 120  capsule 0   metoprolol succinate (TOPROL-XL) 25 MG 24 hr tablet Take 25 mg by mouth daily.     ondansetron (ZOFRAN) 8 MG tablet Take 1 tablet (8 mg total) 2 (two) times daily as needed by mouth (Nausea or vomiting). 30 tablet 1   pantoprazole (PROTONIX) 20 MG tablet Take 20 mg by mouth daily.     tamsulosin (FLOMAX) 0.4 MG CAPS capsule TAKE 1 CAPSULE BY MOUTH ONCE DAILY AFTER  SUPPER 90 capsule 0   tiZANidine (ZANAFLEX) 2 MG tablet Take 2 mg by mouth 3 (three) times daily.     traMADol-acetaminophen (ULTRACET) 37.5-325 MG tablet      triamcinolone cream (KENALOG) 0.1 % Apply 1 application topically 2 (two) times daily.     hydroxychloroquine (PLAQUENIL) 200 MG tablet Take by mouth.     No current facility-administered medications on file prior to visit.    There are no Patient Instructions on file for this visit. No follow-ups on file.   Kris Hartmann, NP

## 2021-07-24 DIAGNOSIS — I351 Nonrheumatic aortic (valve) insufficiency: Secondary | ICD-10-CM | POA: Diagnosis not present

## 2021-07-24 DIAGNOSIS — I10 Essential (primary) hypertension: Secondary | ICD-10-CM | POA: Diagnosis not present

## 2021-07-24 DIAGNOSIS — I340 Nonrheumatic mitral (valve) insufficiency: Secondary | ICD-10-CM | POA: Diagnosis not present

## 2021-07-24 DIAGNOSIS — E782 Mixed hyperlipidemia: Secondary | ICD-10-CM | POA: Diagnosis not present

## 2021-07-24 DIAGNOSIS — I2510 Atherosclerotic heart disease of native coronary artery without angina pectoris: Secondary | ICD-10-CM | POA: Diagnosis not present

## 2021-07-24 DIAGNOSIS — F17200 Nicotine dependence, unspecified, uncomplicated: Secondary | ICD-10-CM | POA: Diagnosis not present

## 2021-07-30 ENCOUNTER — Encounter: Payer: Self-pay | Admitting: Podiatry

## 2021-07-30 ENCOUNTER — Ambulatory Visit (INDEPENDENT_AMBULATORY_CARE_PROVIDER_SITE_OTHER): Payer: Medicare PPO | Admitting: Podiatry

## 2021-07-30 ENCOUNTER — Other Ambulatory Visit: Payer: Self-pay

## 2021-07-30 DIAGNOSIS — B351 Tinea unguium: Secondary | ICD-10-CM

## 2021-07-30 DIAGNOSIS — E1140 Type 2 diabetes mellitus with diabetic neuropathy, unspecified: Secondary | ICD-10-CM

## 2021-07-30 DIAGNOSIS — I739 Peripheral vascular disease, unspecified: Secondary | ICD-10-CM | POA: Diagnosis not present

## 2021-07-30 DIAGNOSIS — M79609 Pain in unspecified limb: Secondary | ICD-10-CM

## 2021-07-30 DIAGNOSIS — D689 Coagulation defect, unspecified: Secondary | ICD-10-CM | POA: Diagnosis not present

## 2021-07-30 NOTE — Progress Notes (Signed)
This patient returns to my office for at risk foot care.  This patient requires this care by a professional since this patient will be at risk due to having PAD and diabetes. And coagulation defect.  Patient is taking eliquiss.  This patient is unable to cut nails himself since the patient cannot reach his nails.These nails are painful walking and wearing shoes.  This patient presents for at risk foot care today.  Patient has self inflicted cut along the medial border right big toe.  General Appearance  Alert, conversant and in no acute stress.  Vascular  Dorsalis pedis and posterior tibial  pulses are not  palpable  bilaterally.  Capillary return is within normal limits  bilaterally. Temperature is within normal limits  bilaterally.  Neurologic  Senn-Weinstein monofilament wire test within normal limits  bilaterally. Muscle power within normal limits bilaterally.  Nails Thick disfigured discolored nails with subungual debris  from hallux to fifth toes bilaterally. No evidence of bacterial infection or drainage bilaterally.  Orthopedic  No limitations of motion  feet .  No crepitus or effusions noted.  No bony pathology or digital deformities noted.  HAV wuth hammer toe  B/L.  Rigid contracted second digit right foot.  Skin  normotropic skin with no porokeratosis noted bilaterally.  No signs of infections or ulcers noted.     Onychomycosis  Pain in right toes  Pain in left toes  Consent was obtained for treatment procedures.   Mechanical debridement of nails 1-5  bilaterally performed with a nail nipper.  Filed with dremel without incident. Self inflicted cut is healing.     Return office visit    10 weeks                 Told patient to return for periodic foot care and evaluation due to potential at risk complications.   Yuridiana Formanek DPM  

## 2021-07-31 DIAGNOSIS — Z79899 Other long term (current) drug therapy: Secondary | ICD-10-CM | POA: Diagnosis not present

## 2021-07-31 DIAGNOSIS — Z0100 Encounter for examination of eyes and vision without abnormal findings: Secondary | ICD-10-CM | POA: Diagnosis not present

## 2021-08-06 ENCOUNTER — Encounter: Payer: Self-pay | Admitting: Oncology

## 2021-08-06 ENCOUNTER — Inpatient Hospital Stay: Payer: Medicare PPO

## 2021-08-06 ENCOUNTER — Inpatient Hospital Stay: Payer: Medicare PPO | Attending: Oncology

## 2021-08-06 ENCOUNTER — Other Ambulatory Visit: Payer: Self-pay

## 2021-08-06 ENCOUNTER — Inpatient Hospital Stay (HOSPITAL_BASED_OUTPATIENT_CLINIC_OR_DEPARTMENT_OTHER): Payer: Medicare PPO | Admitting: Oncology

## 2021-08-06 VITALS — BP 125/74 | HR 75 | Resp 18

## 2021-08-06 VITALS — BP 99/73 | HR 71 | Temp 96.6°F | Resp 18 | Wt 219.0 lb

## 2021-08-06 DIAGNOSIS — D751 Secondary polycythemia: Secondary | ICD-10-CM | POA: Insufficient documentation

## 2021-08-06 DIAGNOSIS — G4730 Sleep apnea, unspecified: Secondary | ICD-10-CM | POA: Insufficient documentation

## 2021-08-06 DIAGNOSIS — C9000 Multiple myeloma not having achieved remission: Secondary | ICD-10-CM | POA: Diagnosis not present

## 2021-08-06 DIAGNOSIS — F17210 Nicotine dependence, cigarettes, uncomplicated: Secondary | ICD-10-CM | POA: Insufficient documentation

## 2021-08-06 DIAGNOSIS — D509 Iron deficiency anemia, unspecified: Secondary | ICD-10-CM

## 2021-08-06 DIAGNOSIS — Z8379 Family history of other diseases of the digestive system: Secondary | ICD-10-CM | POA: Diagnosis not present

## 2021-08-06 DIAGNOSIS — D500 Iron deficiency anemia secondary to blood loss (chronic): Secondary | ICD-10-CM | POA: Diagnosis not present

## 2021-08-06 DIAGNOSIS — E611 Iron deficiency: Secondary | ICD-10-CM

## 2021-08-06 DIAGNOSIS — Q2733 Arteriovenous malformation of digestive system vessel: Secondary | ICD-10-CM | POA: Insufficient documentation

## 2021-08-06 DIAGNOSIS — D472 Monoclonal gammopathy: Secondary | ICD-10-CM | POA: Diagnosis not present

## 2021-08-06 DIAGNOSIS — Z8546 Personal history of malignant neoplasm of prostate: Secondary | ICD-10-CM | POA: Insufficient documentation

## 2021-08-06 DIAGNOSIS — C9001 Multiple myeloma in remission: Secondary | ICD-10-CM

## 2021-08-06 DIAGNOSIS — Z800 Family history of malignant neoplasm of digestive organs: Secondary | ICD-10-CM | POA: Diagnosis not present

## 2021-08-06 LAB — CBC WITH DIFFERENTIAL/PLATELET
Abs Immature Granulocytes: 0.04 10*3/uL (ref 0.00–0.07)
Basophils Absolute: 0 10*3/uL (ref 0.0–0.1)
Basophils Relative: 0 %
Eosinophils Absolute: 0.4 10*3/uL (ref 0.0–0.5)
Eosinophils Relative: 4 %
HCT: 56.7 % — ABNORMAL HIGH (ref 39.0–52.0)
Hemoglobin: 18.3 g/dL — ABNORMAL HIGH (ref 13.0–17.0)
Immature Granulocytes: 0 %
Lymphocytes Relative: 15 %
Lymphs Abs: 1.4 10*3/uL (ref 0.7–4.0)
MCH: 28.8 pg (ref 26.0–34.0)
MCHC: 32.3 g/dL (ref 30.0–36.0)
MCV: 89.3 fL (ref 80.0–100.0)
Monocytes Absolute: 0.8 10*3/uL (ref 0.1–1.0)
Monocytes Relative: 9 %
Neutro Abs: 6.6 10*3/uL (ref 1.7–7.7)
Neutrophils Relative %: 72 %
Platelets: 196 10*3/uL (ref 150–400)
RBC: 6.35 MIL/uL — ABNORMAL HIGH (ref 4.22–5.81)
RDW: 18.8 % — ABNORMAL HIGH (ref 11.5–15.5)
WBC: 9.3 10*3/uL (ref 4.0–10.5)
nRBC: 0 % (ref 0.0–0.2)

## 2021-08-06 LAB — COMPREHENSIVE METABOLIC PANEL
ALT: 18 U/L (ref 0–44)
AST: 17 U/L (ref 15–41)
Albumin: 4 g/dL (ref 3.5–5.0)
Alkaline Phosphatase: 79 U/L (ref 38–126)
Anion gap: 8 (ref 5–15)
BUN: 22 mg/dL (ref 8–23)
CO2: 25 mmol/L (ref 22–32)
Calcium: 9.2 mg/dL (ref 8.9–10.3)
Chloride: 104 mmol/L (ref 98–111)
Creatinine, Ser: 1.5 mg/dL — ABNORMAL HIGH (ref 0.61–1.24)
GFR, Estimated: 47 mL/min — ABNORMAL LOW (ref >60)
Glucose, Bld: 96 mg/dL (ref 70–99)
Potassium: 4.1 mmol/L (ref 3.5–5.1)
Sodium: 137 mmol/L (ref 135–145)
Total Bilirubin: 0.3 mg/dL (ref 0.3–1.2)
Total Protein: 8.2 g/dL — ABNORMAL HIGH (ref 6.5–8.1)

## 2021-08-06 LAB — PSA: Prostatic Specific Antigen: 0.12 ng/mL (ref 0.00–4.00)

## 2021-08-06 MED ORDER — SODIUM CHLORIDE 0.9 % IV SOLN
Freq: Once | INTRAVENOUS | Status: AC
  Filled 2021-08-06: qty 250

## 2021-08-06 NOTE — Patient Instructions (Signed)

## 2021-08-06 NOTE — Progress Notes (Signed)
Hematology/Oncology Progress note [phone] [phone]     Patient Care Team: Jodi Marble, MD as PCP - General (Internal Medicine) Bary Castilla, Forest Gleason, MD (General Surgery) Edrick Kins, MD as Rounding Team (Internal Medicine) Hillary Bow, MD (Inactive) as Consulting Physician (Internal Medicine) [name], MD as Surgeon (Gastroenterology) Isaias Cowman, MD as Consulting Physician (Cardiology) Earlie Server, MD as Consulting Physician (Oncology)  REFERRING PROVIDER: Dr.Lateef, [place]  REASON FOR VISIT Follow up for management of plasma cell dyscrasia.  And iron deficiency anemia.   HISTORY OF PRESENTING ILLNESS:  81 y.o.  male with PMH listed below who presents to follow up on the evaluation and management of his abnormal urine protein electrophoresis results. I reviewed the records from [facility], [place] and [place] was performed and HemOnc related medical problems are listed below.  He lives with a friend. He has 3 adult children.   1 Chronic Kidney disease, Stage III: patient follows up with Dr.Lateef.  2 Proteinuria:  02/18/2017: Albumin/Creatinine ratio was 7, Urine protein electrophoresis,Random Urine revealed M Spike 43.2%, total protein of 43.30m/dl,  01/05/2017 Albumin/Creatinine ratio was 455.2, Albumin 996.5, , 2.15.2018 Albumin/Creatinine ratio was 80.3, Albumin 73.3, Urine protein electrophoresis,Random Urine revealed M Spike 29.7%, total protein of 29.16mdl, 06/30/2016 Serum protein electrophoresis did not detect M spike.  Autoimmune disorder work up showed Negative anti dsDNA, RNP antibodies, smith antibody, Sjogren antibodies, anti Jo-1, positive antichromotin antibodies, positive ANA,  3 Anemia of chronic kidney disease:  4 Iron deficiency anemia: history of iron deficiency, ferritin was 7 on 08/11/2016. He had EGD and colonoscopy that were done this year which showed  duodenitis/esphagitis/diverticulosis/benigh polyps. # [facility]  5 Rheumatoid arthritis: he follows up with Dr.Kernodle and is on chronic MTX. He Is currently off MTX reports joint pains are controlled, not quite symptomatic.   Patient denies any persistent bone pain or any pain. He continue to feel lack of energy, persistent, not improved with resting or taking naps. He also has lost 25 pounds in the past year which was unintentional.   # Rheumatoid arthritis and currently is off MTX.  # It is ambiguous whether he has truly symptomatic multiple myeloma or a smoldering myeloma, given that anemia can be secondary to iron deficiency and CKD.  The hypermetabolic 2.1 cm hypermetabolic soft tissue lesion in the left heel, can reflect a Plasmacytoma,which should have been biopsied. However patient initially denies being on any blood thinner, later he was found to be on Plavix with pre biopsy questionnaire screening, and biopsy was held. Patient has to contact cardiologist to hold plavix for seven days before procedure. He feels frustrated about multiple workup and meanwhile he becomes more fatigues, with worsening of kidney function. Patient was reluctant about proceeding additional invasive procedures and wants to start treatment. Decision was made to start active MM treatment.   # [facility]. He is not considered to  good candidate for transplant. Patient cardiologist Dr. KaChancy Milroyad started patient on Lasix 20 mg daily. [facility] as patient was having dizziness and borderline blood pressure during his clinic visit there.  10/02/2018 He had follow-up left foot/ankle x-ray as a follow-up of his left heel lesion which was initially presented on PET scan and not detectable on follow-up PET scan.  Images were reviewed by me.  No suspicious acute or  subacute  osseous abnormality  Current Treatment:  S/p RVD x 4 cycles tolerates well except cytopenia.  Revlimid 10 mg PO once per day on days 1 to 14 (dose reduced due to GFR) Revlimid renal dosing of 50m,  Velcade 1.3 mg/m2 IV once per day on days 1,  8, 15 Dexamethasone 20 mg PO once per day on days 1, 8, 15 given patient's diabetes Given patient's multiple comorbidity and the side effects of cytopenia during his RVD treatment and the uncertainty of this is really a site of plasmacytoma or not, I recommend not to continue him on maintenance Revlimid.   # repeat small bowel capsule study on 02/16/2019 Which showed multiple AVMs seen in the mid jejunum proximal to the site of prior tattoo. # [facility]2019 double-balloon enteroscopy with findings of 5 AVMs in the jejunum and 2 AVMs in the duodenum and AVMs were ablated with APC.  Tattoo was seen in the area. If further bleeding, recommend to consider treatment with octreotide. Patient takes Protonix daily.    INTERVAL HISTORY Patient presents for follow-up of management of multiple myeloma/smoldering multiple myeloma and iron deficiency, secondary erythrocytosis. Patient continues to smoke about a pack of cigarettes daily. Patient has sleep apnea, he uses CPAP machine as much as he can, approximately 2-3 times per week. No other new complaints. Review of Systems  Constitutional:  Positive for fatigue. Negative for appetite change, chills, fever and unexpected weight change.  HENT:   Negative for hearing loss and voice change.   Eyes:  Negative for eye problems and icterus.  Respiratory:  Negative for chest tightness, cough and shortness of breath.   Cardiovascular:  Negative for chest pain and leg swelling.  Gastrointestinal:  Negative for abdominal distention, abdominal pain and diarrhea.  Endocrine: Negative for hot flashes.  Genitourinary:  Negative for difficulty urinating, dysuria and frequency.    Musculoskeletal:  Positive for arthralgias.  Skin:  Negative for itching and rash.  Neurological:  Negative for light-headedness and numbness.  Hematological:  Negative for adenopathy. Does not bruise/bleed easily.  Psychiatric/Behavioral:  Negative for confusion.     MEDICAL HISTORY:  Past Medical History:  Diagnosis Date   Anxiety    Arthritis    rheumatoid   Atrial fibrillation (HCC)    hx of   Cancer (HCC)    Chronic kidney disease    Colon polyp 07-07-15   TUBULAR ADENOMA WITH AT LEAST HIGH-GRADE / Dr RRayann Heman  Diabetes mellitus without complication ([facility]    Dysrhythmia    bradycardia....Marland KitchenMarland Kitchen to 1 heart block   GERD (gastroesophageal reflux disease)    Heart murmur    patient unaware of history of murmur   Hypercholesterolemia    Hypertension    Presence of permanent cardiac pacemaker    Prostate cancer ([facility] 01/01/13, 01/30/14   Gleason 3+4=7, volume 46.6 cc   [place]    S/P radiation therapy  04/03/2014 through 06/04/2014                                                      Prostate 7800 cGy in 40 sessions                           Sleep apnea  uses cpap but it is not in the hospital with him    SURGICAL HISTORY: Past Surgical History:  Procedure Laterality Date   ABDOMINAL AORTIC ANEURYSM REPAIR  11/2013   COLON SURGERY  March 2017   Right hemicolectomy for tubulovillous adenoma with high-grade dysplasia.   COLONOSCOPY WITH PROPOFOL N/A 07/07/2015   Procedure: COLONOSCOPY WITH PROPOFOL;  Surgeon: Josefine Class, MD;  Location: [facility] ENDOSCOPY;  Service: Endoscopy;  Laterality: N/A;   COLONOSCOPY WITH PROPOFOL N/A 10/28/2016   Procedure: COLONOSCOPY WITH PROPOFOL;  Surgeon: [name], MD;  Location: [facility] ENDOSCOPY;  Service: Endoscopy;  Laterality: N/A;   ESOPHAGOGASTRODUODENOSCOPY (EGD) WITH PROPOFOL N/A 10/28/2016   Procedure: ESOPHAGOGASTRODUODENOSCOPY (EGD) WITH PROPOFOL;  Surgeon: [name], MD;  Location: [facility] ENDOSCOPY;  Service:  Endoscopy;  Laterality: N/A;   GIVENS CAPSULE STUDY N/A 07/25/2017   Procedure: GIVENS CAPSULE STUDY;  Surgeon: [name], MD;  Location: [facility] ENDOSCOPY;  Service: Gastroenterology;  Laterality: N/A;   GIVENS CAPSULE STUDY N/A 01/29/2019   Procedure: GIVENS CAPSULE STUDY;  Surgeon: [name], MD;  Location: [facility] ENDOSCOPY;  Service: Gastroenterology;  Laterality: N/A;   LAPAROSCOPIC RIGHT COLECTOMY Right 08/08/2015   Procedure: LAPAROSCOPIC RIGHT COLECTOMY;  Surgeon: Robert Bellow, MD;  Location: [facility] ORS;  Service: General;  Laterality: Right;   NASAL SINUS SURGERY     PACEMAKER INSERTION Left 08/26/2016   Procedure: INSERTION PACEMAKER;  Surgeon: Isaias Cowman, MD;  Location: [facility] ORS;  Service: Cardiovascular;  Laterality: Left;   PROSTATE BIOPSY  01/01/13, 01/30/14   Gleason 3+3=6, vol 46.6 cc   TONSILLECTOMY     uvula surgery     for sleep apnea    SOCIAL HISTORY: Social History   Socioeconomic History   Marital status: Divorced    Spouse name: Not on file   Number of children: Not on file   Years of education: Not on file   Highest education level: Not on file  Occupational History   Not on file  Tobacco Use   Smoking status: Every Day    Packs/day: 1.00    Years: 60.00    Pack years: 60.00    Types: Cigarettes   Smokeless tobacco: Never   Tobacco comments:    DOWN TO 1/2 PPD  Vaping Use   Vaping Use: Never used  Substance and Sexual Activity   Alcohol use: Yes    Alcohol/week: 1.0 standard drink    Types: 1 Cans of beer per week    Comment: occasionally   Drug use: No   Sexual activity: Not Currently  Other Topics Concern   Not on file  Social History Narrative   Not on file   Social Determinants of Health   Financial Resource Strain: Not on file  Food Insecurity: Not on file  Transportation Needs: Not on file  Physical Activity: Not on file  Stress: Not on file  Social Connections: Not on file  Intimate Partner Violence: Not on file     FAMILY HISTORY: Family History  Problem Relation Age of Onset   Heart attack Mother    Cirrhosis Father    Pancreatic cancer Brother    Diabetes Brother    Diabetes Daughter        medication induced for cancer treatments   Cancer Daughter        breast, brain    ALLERGIES:  has No Known Allergies.  MEDICATIONS:  Current Outpatient Medications  Medication Sig Dispense Refill   acetaminophen (TYLENOL) 325 MG tablet Take 325  mg by mouth every 6 (six) hours as needed.     albuterol (PROVENTIL HFA;VENTOLIN HFA) 108 (90 Base) MCG/ACT inhaler      ALPRAZolam (XANAX) 0.25 MG tablet Take by mouth 2 (two) times daily as needed.     amLODipine-benazepril (LOTREL) 5-20 MG capsule Take 1 capsule by mouth daily.      apixaban (ELIQUIS) 2.5 MG TABS tablet Take 2.5 mg by mouth 2 (two) times daily.      atorvastatin (LIPITOR) 80 MG tablet Take by mouth.     benazepril (LOTENSIN) 20 MG tablet Take 20 mg by mouth daily.     Cholecalciferol 1.25 MG (50000 UT) capsule Take by mouth.      colchicine 0.6 MG tablet Take by mouth.     ezetimibe (ZETIA) 10 MG tablet Take 1 tablet by mouth 1 day or 1 dose.      ferrous sulfate 325 (65 FE) MG EC tablet Take 1 tablet (325 mg total) by mouth daily with breakfast. 90 tablet 1   fexofenadine (ALLEGRA) 180 MG tablet Take by mouth.     fluticasone (FLONASE) 50 MCG/ACT nasal spray Place 2 sprays into both nostrils daily as needed for rhinitis.      fluticasone furoate-vilanterol (BREO ELLIPTA) 100-25 MCG/INH AEPB Inhale 1 puff into the lungs daily.      furosemide (LASIX) 20 MG tablet Take 20 mg by mouth daily.     gabapentin (NEURONTIN) 400 MG capsule Take by mouth.     glucose blood (ONETOUCH ULTRA) test strip      HUMIRA PEN 40 MG/0.4ML PNKT      hydroxychloroquine (PLAQUENIL) 200 MG tablet Take by mouth.     isosorbide mononitrate (IMDUR) 30 MG 24 hr tablet Take 30 mg by mouth daily.     JARDIANCE 25 MG TABS tablet      loperamide (IMODIUM) 2 MG  capsule Take 1 capsule (2 mg total) by mouth See admin instructions. With onset of diarrhea, take 69m followed by 257mevery 2 hours resolved. Maximum: 16 mg/day 120 capsule 0   metoprolol succinate (TOPROL-XL) 25 MG 24 hr tablet Take 25 mg by mouth daily.     ondansetron (ZOFRAN) 8 MG tablet Take 1 tablet (8 mg total) 2 (two) times daily as needed by mouth (Nausea or vomiting). 30 tablet 1   pantoprazole (PROTONIX) 20 MG tablet Take 20 mg by mouth daily.     tamsulosin (FLOMAX) 0.4 MG CAPS capsule TAKE 1 CAPSULE BY MOUTH ONCE DAILY AFTER  SUPPER 90 capsule 0   tiZANidine (ZANAFLEX) 2 MG tablet Take 2 mg by mouth 3 (three) times daily.     triamcinolone cream (KENALOG) 0.1 % Apply 1 application topically 2 (two) times daily.     benazepril (LOTENSIN) 10 MG tablet Take by mouth. (Patient not taking: Reported on 08/06/2021)     Butalbital-APAP-Caffeine 50-300-40 MG CAPS Take 1 capsule every four to six hours as needed (Patient not taking: Reported on 08/06/2021)     traMADol-acetaminophen (ULTRACET) 37.5-325 MG tablet  (Patient not taking: Reported on 08/06/2021)     No current facility-administered medications for this visit.      . Marland KitchenPHYSICAL EXAMINATION: ECOG PERFORMANCE STATUS: 1 - Symptomatic but completely ambulatory Vitals:   08/06/21 1409  BP: 99/73  Pulse: 71  Resp: 18  Temp: (!) 96.6 F (35.9 C)   Filed Weights   08/06/21 1409  Weight: 219 lb (99.3 kg)   Physical Exam Constitutional:  General: He is not in acute distress.    Appearance: He is not diaphoretic.  HENT:     Head: Normocephalic and atraumatic.     Nose: Nose normal.     Mouth/Throat:     Pharynx: No oropharyngeal exudate.  Eyes:     General: No scleral icterus.       Right eye: No discharge.        Left eye: No discharge.     Pupils: Pupils are equal, round, and reactive to light.  Neck:     Vascular: No JVD.  Cardiovascular:     Rate and Rhythm: Normal rate and regular rhythm.     Heart sounds: Normal  heart sounds. No murmur heard.   No friction rub.  Pulmonary:     Effort: Pulmonary effort is normal. No respiratory distress.     Breath sounds: Normal breath sounds. No wheezing or rales.  Chest:     Chest wall: No tenderness.  Abdominal:     General: Bowel sounds are normal. There is no distension.     Palpations: Abdomen is soft. There is no mass.     Tenderness: There is no abdominal tenderness.  Musculoskeletal:        General: No tenderness. Normal range of motion.     Cervical back: Normal range of motion and neck supple.  Lymphadenopathy:     Cervical: No cervical adenopathy.  Skin:    General: Skin is warm and dry.     Findings: No erythema.     Comments: Hypopigmentation of skin  Neurological:     Mental Status: He is alert and oriented to person, place, and time.  Psychiatric:        Mood and Affect: Affect normal.    LABORATORY DATA:  I have reviewed the data as listed Lab Results  Component Value Date   WBC 9.3 08/06/2021   HGB 18.3 (H) 08/06/2021   HCT 56.7 (H) 08/06/2021   MCV 89.3 08/06/2021   PLT 196 08/06/2021   Recent Labs    10/17/20 1242 08/06/21 1337  NA 139 137  K 4.2 4.1  CL 107 104  CO2 24 25  GLUCOSE 141* 96  BUN 15 22  CREATININE 1.63* 1.50*  CALCIUM 8.8* 9.2  GFRNONAA 43* 47*  PROT 7.9 8.2*  ALBUMIN 3.8 4.0  AST 24 17  ALT 24 18  ALKPHOS 67 79  BILITOT 0.7 0.3     SPEP: no M spike Serum free light chain Kappa/Lamda ratio 13.04 UPEP  Free light chain Kappa/lamda ratio 162.43, M spike $Remov'198mg'eUbQWg$ /24 hour   Bone marrow biopsy 03/21/2017  Bone Marrow, Aspirate,Biopsy, and Clot, left iliac - HYPERCELLULAR BONE MARROW FOR AGE WITH TRILINEAGE HEMATOPOIESIS. - PLASMACYTOSIS (PLASMA CELLS 8%)  Karyotype: loss of Y chromosome Cytogenetic MDS FISH Panel negative.   Bone marrow 04/06/2017  Bone Marrow, Aspirate,Biopsy, and Clot, right iliac and core - HYPERCELLULAR BONE MARROW FOR AGE WITH PLASMA CELL NEOPLASM - TRILINEAGE  HEMATOPOIESIS. - SEE COMMENT. PERIPHERAL BLOOD: - MICROCYTIC-HYPOCHROMIC ANEMIA. Diagnosis Note The bone marrow is hypercellular with trilineage hematopoiesis but with relative abundance of erythroid precursors and increased number of megakaryocytes with nonspecific changes. Significant dyspoiesis is not seen. Iron stores are present with no ring sideroblasts. The plasma cells are increased in number representing 8% of all cells in the aspirate although focal areas in the core biopsy show 10 to 20% as primarily seen by CD138 stain. In situ hybridization for kappa and lambda light chains  show kappa light chain restriction consistent with plasma cell neoplasm. Correlation with cytogenetic and FISH studies is recommended. (BNS:ecj/gt 04/07/2017)  IMAGE STUDIES I have personally reviewed below image results.  03/15/2017 DG bone survey Met: Nonspecific lucent lesion in the distal right ulna measuring 15-16 mm, but otherwise normal bone mineralization for age throughout the visible skeleton. A solitary lytic lesion in a distal extremity would be an unusual presentation of multiple myeloma, and I favor the distal right ulna lesion is benign. Recommend correlation with serum and urine protein electrophoresis.  04/14/2017 PET scan: 1. 2.1 cm hypermetabolic soft tissue lesion in the left heel, adjacent to the calcaneal tuberosity. 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 2. Mottled FDG accumulation diffusely in the marrow space without other frankly overt hypermetabolic bony lesion. 3. Coronary artery and thoracoabdominal aortic atherosclerosis with abdominal aortic stent graft visualized in situ. 4. Bilateral renal cysts and bilateral nonobstructing renal stones.   08/24/2017 PET scan 1. No hypermetabolic osseous lesions. No definite signs of multiple myeloma on CT imaging. 2. Interval improvement in soft tissue activity along the medial aspect of the left calcaneal tuberosity, likely plantar  fasciitis. 3. New small left pleural effusion. 4. Bilateral inguinal hernias containing small bowel on the right and sigmoid colon on the left. No evidence of incarceration or obstruction. 5. Otherwise stable incidental findings including diffuse atherosclerosis, bilateral renal cysts, emphysema and sigmoid diverticulosis.  ASSESSMENT & PLAN:  1. Smoldering multiple myeloma   2. History of prostate cancer   3. Secondary erythrocytosis   4. Iron deficiency    #Secondary erythrocytosis, likely due to smoking and sleep apnea Hematocrit is > 55, proceed with phlebotomy. Continue periodic phlebotomy program.  #Iron deficiency anemia secondary to chronic blood loss due to multiple AVM in small bowels. Status post enteroscopy [facility] for AVM ablation. Hold off iron supplementation/infusion due to erythrocytosis.  #Smoldering kappa light chain multiple myeloma/active myeloma if previous hypermetabolic lesion on PET scan was related to myeloma.Patient was treated with 4 cycles of RVD.  He is not currently on any maintenance therapy due to multiple comorbidities, borderline smoldering multiple myeloma status.   24-hour urine showed Bence-Jones protein. Monitor multiple myeloma panel has been monitored.   05/07/2022, multiple myeloma panel showed no M protein.  Light chain ratio 5.86, slightly increasing.  Close monitor. His kidney function is stable, no anemia.  No hypercalcemia. Consider repeat bone marrow biopsy if he starts to show signs of organ damage.  #History of prostate cancer, PSA is pending.  Continue follow-up PSA level annually  Follow-up  Continue H&H in 2 months +/- Phlebotomy if Hct >-55 Lab MD +/- phlebotomy in 4 months.    Orders Placed This Encounter  Procedures   CBC with Differential/Platelet    Standing Status:   Future    Standing Expiration Date:   08/07/2022   CBC with Differential/Platelet    Standing Status:   Future    Standing Expiration Date:   08/07/2022    Comprehensive metabolic panel    Standing Status:   Future    Standing Expiration Date:   08/06/2022   Kappa/lambda light chains    Standing Status:   Future    Standing Expiration Date:   08/06/2022   Multiple Myeloma Panel (SPEP&IFE w/QIG)    Standing Status:   Future    Standing Expiration Date:   08/06/2022    Earlie Server, MD, PhD Hematology Oncology [place] at [facility] Pager- [phone] 08/06/2021

## 2021-08-06 NOTE — Progress Notes (Signed)
Pt here for follow up. No new concerns voiced.   

## 2021-08-06 NOTE — Progress Notes (Signed)
Pt received 300 ml therapeutic phlebotomy. Rehydrated with 300 ml NS. Tolerated procedure well. Discharged to home. ?

## 2021-08-07 LAB — KAPPA/LAMBDA LIGHT CHAINS
Kappa free light chain: 155.8 mg/L — ABNORMAL HIGH (ref 3.3–19.4)
Kappa, lambda light chain ratio: 5.54 — ABNORMAL HIGH (ref 0.26–1.65)
Lambda free light chains: 28.1 mg/L — ABNORMAL HIGH (ref 5.7–26.3)

## 2021-08-10 LAB — MULTIPLE MYELOMA PANEL, SERUM
Albumin SerPl Elph-Mcnc: 3.7 g/dL (ref 2.9–4.4)
Albumin/Glob SerPl: 1 (ref 0.7–1.7)
Alpha 1: 0.2 g/dL (ref 0.0–0.4)
Alpha2 Glob SerPl Elph-Mcnc: 0.8 g/dL (ref 0.4–1.0)
B-Globulin SerPl Elph-Mcnc: 1.1 g/dL (ref 0.7–1.3)
Gamma Glob SerPl Elph-Mcnc: 1.9 g/dL — ABNORMAL HIGH (ref 0.4–1.8)
Globulin, Total: 4 g/dL — ABNORMAL HIGH (ref 2.2–3.9)
IgA: 270 mg/dL (ref 61–437)
IgG (Immunoglobin G), Serum: 2031 mg/dL — ABNORMAL HIGH (ref 603–1613)
IgM (Immunoglobulin M), Srm: 111 mg/dL (ref 15–143)
Total Protein ELP: 7.7 g/dL (ref 6.0–8.5)

## 2021-08-12 DIAGNOSIS — I340 Nonrheumatic mitral (valve) insufficiency: Secondary | ICD-10-CM | POA: Diagnosis not present

## 2021-08-12 DIAGNOSIS — I2510 Atherosclerotic heart disease of native coronary artery without angina pectoris: Secondary | ICD-10-CM | POA: Diagnosis not present

## 2021-08-12 DIAGNOSIS — I4891 Unspecified atrial fibrillation: Secondary | ICD-10-CM | POA: Diagnosis not present

## 2021-08-12 DIAGNOSIS — I351 Nonrheumatic aortic (valve) insufficiency: Secondary | ICD-10-CM | POA: Diagnosis not present

## 2021-08-12 DIAGNOSIS — G4733 Obstructive sleep apnea (adult) (pediatric): Secondary | ICD-10-CM | POA: Diagnosis not present

## 2021-08-12 DIAGNOSIS — I7389 Other specified peripheral vascular diseases: Secondary | ICD-10-CM | POA: Diagnosis not present

## 2021-08-12 DIAGNOSIS — I10 Essential (primary) hypertension: Secondary | ICD-10-CM | POA: Diagnosis not present

## 2021-08-12 DIAGNOSIS — Z950 Presence of cardiac pacemaker: Secondary | ICD-10-CM | POA: Diagnosis not present

## 2021-08-12 DIAGNOSIS — R0602 Shortness of breath: Secondary | ICD-10-CM | POA: Diagnosis not present

## 2021-08-13 DIAGNOSIS — C9001 Multiple myeloma in remission: Secondary | ICD-10-CM | POA: Diagnosis not present

## 2021-08-13 DIAGNOSIS — M069 Rheumatoid arthritis, unspecified: Secondary | ICD-10-CM | POA: Diagnosis not present

## 2021-08-13 DIAGNOSIS — M545 Low back pain, unspecified: Secondary | ICD-10-CM | POA: Diagnosis not present

## 2021-08-13 DIAGNOSIS — M0579 Rheumatoid arthritis with rheumatoid factor of multiple sites without organ or systems involvement: Secondary | ICD-10-CM | POA: Diagnosis not present

## 2021-08-13 DIAGNOSIS — N1832 Chronic kidney disease, stage 3b: Secondary | ICD-10-CM | POA: Diagnosis not present

## 2021-08-13 DIAGNOSIS — M4316 Spondylolisthesis, lumbar region: Secondary | ICD-10-CM | POA: Diagnosis not present

## 2021-08-13 DIAGNOSIS — R0602 Shortness of breath: Secondary | ICD-10-CM | POA: Diagnosis not present

## 2021-08-13 DIAGNOSIS — G8929 Other chronic pain: Secondary | ICD-10-CM | POA: Diagnosis not present

## 2021-08-13 DIAGNOSIS — M5136 Other intervertebral disc degeneration, lumbar region: Secondary | ICD-10-CM | POA: Diagnosis not present

## 2021-08-25 DIAGNOSIS — M5416 Radiculopathy, lumbar region: Secondary | ICD-10-CM | POA: Diagnosis not present

## 2021-08-25 DIAGNOSIS — M5136 Other intervertebral disc degeneration, lumbar region: Secondary | ICD-10-CM | POA: Diagnosis not present

## 2021-08-26 DIAGNOSIS — I10 Essential (primary) hypertension: Secondary | ICD-10-CM | POA: Diagnosis not present

## 2021-08-26 DIAGNOSIS — N181 Chronic kidney disease, stage 1: Secondary | ICD-10-CM | POA: Diagnosis not present

## 2021-08-26 DIAGNOSIS — F411 Generalized anxiety disorder: Secondary | ICD-10-CM | POA: Diagnosis not present

## 2021-08-26 DIAGNOSIS — E119 Type 2 diabetes mellitus without complications: Secondary | ICD-10-CM | POA: Diagnosis not present

## 2021-08-26 DIAGNOSIS — J449 Chronic obstructive pulmonary disease, unspecified: Secondary | ICD-10-CM | POA: Diagnosis not present

## 2021-08-26 DIAGNOSIS — Z1331 Encounter for screening for depression: Secondary | ICD-10-CM | POA: Diagnosis not present

## 2021-08-26 DIAGNOSIS — F17200 Nicotine dependence, unspecified, uncomplicated: Secondary | ICD-10-CM | POA: Diagnosis not present

## 2021-08-26 DIAGNOSIS — Z0001 Encounter for general adult medical examination with abnormal findings: Secondary | ICD-10-CM | POA: Diagnosis not present

## 2021-08-26 DIAGNOSIS — M150 Primary generalized (osteo)arthritis: Secondary | ICD-10-CM | POA: Diagnosis not present

## 2021-09-10 ENCOUNTER — Emergency Department
Admission: EM | Admit: 2021-09-10 | Discharge: 2021-09-11 | Disposition: A | Discharge status: Home / Self Care | Payer: Medicare PPO | Attending: Emergency Medicine | Admitting: Emergency Medicine

## 2021-09-10 ENCOUNTER — Encounter: Payer: Self-pay | Admitting: Emergency Medicine

## 2021-09-10 ENCOUNTER — Other Ambulatory Visit: Payer: Self-pay

## 2021-09-10 DIAGNOSIS — Z950 Presence of cardiac pacemaker: Secondary | ICD-10-CM | POA: Diagnosis not present

## 2021-09-10 DIAGNOSIS — N189 Chronic kidney disease, unspecified: Secondary | ICD-10-CM | POA: Insufficient documentation

## 2021-09-10 DIAGNOSIS — R319 Hematuria, unspecified: Secondary | ICD-10-CM | POA: Diagnosis not present

## 2021-09-10 DIAGNOSIS — E1122 Type 2 diabetes mellitus with diabetic chronic kidney disease: Secondary | ICD-10-CM | POA: Diagnosis not present

## 2021-09-10 DIAGNOSIS — Z7901 Long term (current) use of anticoagulants: Secondary | ICD-10-CM | POA: Insufficient documentation

## 2021-09-10 DIAGNOSIS — Z8546 Personal history of malignant neoplasm of prostate: Secondary | ICD-10-CM | POA: Diagnosis not present

## 2021-09-10 DIAGNOSIS — I4891 Unspecified atrial fibrillation: Secondary | ICD-10-CM | POA: Diagnosis not present

## 2021-09-10 DIAGNOSIS — K4020 Bilateral inguinal hernia, without obstruction or gangrene, not specified as recurrent: Secondary | ICD-10-CM | POA: Diagnosis not present

## 2021-09-10 DIAGNOSIS — N200 Calculus of kidney: Secondary | ICD-10-CM | POA: Diagnosis not present

## 2021-09-10 DIAGNOSIS — R310 Gross hematuria: Secondary | ICD-10-CM | POA: Insufficient documentation

## 2021-09-10 DIAGNOSIS — I129 Hypertensive chronic kidney disease with stage 1 through stage 4 chronic kidney disease, or unspecified chronic kidney disease: Secondary | ICD-10-CM | POA: Insufficient documentation

## 2021-09-10 LAB — CBC
HCT: 53.3 % — ABNORMAL HIGH (ref 39.0–52.0)
Hemoglobin: 17.2 g/dL — ABNORMAL HIGH (ref 13.0–17.0)
MCH: 28.4 pg (ref 26.0–34.0)
MCHC: 32.3 g/dL (ref 30.0–36.0)
MCV: 88.1 fL (ref 80.0–100.0)
Platelets: 224 10*3/uL (ref 150–400)
RBC: 6.05 MIL/uL — ABNORMAL HIGH (ref 4.22–5.81)
RDW: 17.9 % — ABNORMAL HIGH (ref 11.5–15.5)
WBC: 9.9 10*3/uL (ref 4.0–10.5)
nRBC: 0 % (ref 0.0–0.2)

## 2021-09-10 NOTE — ED Triage Notes (Signed)
Pt to ED from home c/o hematuria that started about 5 min PTA.  Bright red blood.  Hx of prostate cancer and multiple myeloma.  Takes Eliquis.  Denies pain or SOB or trouble urinating. ?

## 2021-09-11 ENCOUNTER — Emergency Department: Payer: Medicare PPO

## 2021-09-11 DIAGNOSIS — K4020 Bilateral inguinal hernia, without obstruction or gangrene, not specified as recurrent: Secondary | ICD-10-CM | POA: Diagnosis not present

## 2021-09-11 DIAGNOSIS — Z8546 Personal history of malignant neoplasm of prostate: Secondary | ICD-10-CM | POA: Diagnosis not present

## 2021-09-11 DIAGNOSIS — N200 Calculus of kidney: Secondary | ICD-10-CM | POA: Diagnosis not present

## 2021-09-11 DIAGNOSIS — R319 Hematuria, unspecified: Secondary | ICD-10-CM | POA: Diagnosis not present

## 2021-09-11 LAB — BASIC METABOLIC PANEL
Anion gap: 9 (ref 5–15)
BUN: 17 mg/dL (ref 8–23)
CO2: 22 mmol/L (ref 22–32)
Calcium: 9.1 mg/dL (ref 8.9–10.3)
Chloride: 107 mmol/L (ref 98–111)
Creatinine, Ser: 1.56 mg/dL — ABNORMAL HIGH (ref 0.61–1.24)
GFR, Estimated: 45 mL/min — ABNORMAL LOW (ref >60)
Glucose, Bld: 89 mg/dL (ref 70–99)
Potassium: 4.3 mmol/L (ref 3.5–5.1)
Sodium: 138 mmol/L (ref 135–145)

## 2021-09-11 LAB — URINALYSIS, COMPLETE (UACMP) WITH MICROSCOPIC
Bacteria, UA: NONE SEEN
RBC / HPF: >50 RBC/hpf — ABNORMAL HIGH (ref 0–5)
Specific Gravity, Urine: 1.017 (ref 1.005–1.030)
Squamous Epithelial / HPF: NONE SEEN (ref 0–5)
WBC, UA: >50 WBC/hpf — ABNORMAL HIGH (ref 0–5)

## 2021-09-11 LAB — PROTIME-INR
INR: 1.1 (ref 0.8–1.2)
Prothrombin Time: 14.4 seconds (ref 11.4–15.2)

## 2021-09-11 NOTE — ED Provider Notes (Signed)
? ?[facility] ?Provider Note ? ? ? Event Date/Time  ? First MD Initiated Contact with Patient 09/10/21 2335   ?  (approximate) ? ? ?History  ? ?Hematuria ? ? ?HPI ? ?Omar [name] is a 81 y.o. male with a history of A-fib on Eliquis, remote prostate cancer status post radiation therapy in 2015, OSA on CPAP, hypertension, hyperlipidemia, diabetes, chronic kidney disease who presents for evaluation of hematuria.  Symptoms started 10 minutes prior to arrival.  Patient reports red wine colored urine with blood clots.  Denies any prior history of hematuria.  Denies abdominal pain, dysuria, flank pain, trauma, fever or chills.  Patient has had 2 episodes of gross hematuria with clots.  He reports that he has been able to fully empty his bladder. ?  ? ? ?Past Medical History:  ?Diagnosis Date  ? Anxiety   ? Arthritis   ? rheumatoid  ? Atrial fibrillation ([place])   ? hx of  ? [facility])   ? Chronic kidney disease   ? Colon polyp 07-07-15  ? TUBULAR ADENOMA WITH AT LEAST HIGH-GRADE / Dr Rayann Heman  ? Diabetes mellitus without complication ([place])   ? Dysrhythmia   ? bradycardia...Marland KitchenMarland Kitchen2 to 1 heart block  ? GERD (gastroesophageal reflux disease)   ? Heart murmur   ? patient unaware of history of murmur  ? Hypercholesterolemia   ? Hypertension   ? Presence of permanent cardiac pacemaker   ? Prostate cancer ([place]) 01/01/13, 01/30/14  ? Gleason 3+4=7, volume 46.6 cc  ? Rheumatoid arthritis ([place])   ? S/P radiation therapy  04/03/2014 through 06/04/2014                                                     ? Prostate 7800 cGy in 40 sessions                          ? Sleep apnea   ? uses cpap but it is not in the hospital with him  ? ? ?Past Surgical History:  ?Procedure Laterality Date  ? ABDOMINAL AORTIC ANEURYSM REPAIR  11/2013  ? COLON SURGERY  March 2017  ? Right hemicolectomy for tubulovillous adenoma with high-grade dysplasia.  ? COLONOSCOPY WITH PROPOFOL N/A 07/07/2015  ? Procedure: COLONOSCOPY WITH PROPOFOL;   Surgeon: Josefine Class, MD;  Location: [facility] ENDOSCOPY;  Service: Endoscopy;  Laterality: N/A;  ? COLONOSCOPY WITH PROPOFOL N/A 10/28/2016  ? Procedure: COLONOSCOPY WITH PROPOFOL;  Surgeon: [name], MD;  Location: [facility] ENDOSCOPY;  Service: Endoscopy;  Laterality: N/A;  ? ESOPHAGOGASTRODUODENOSCOPY (EGD) WITH PROPOFOL N/A 10/28/2016  ? Procedure: ESOPHAGOGASTRODUODENOSCOPY (EGD) WITH PROPOFOL;  Surgeon: [name], MD;  Location: [facility] ENDOSCOPY;  Service: Endoscopy;  Laterality: N/A;  ? GIVENS CAPSULE STUDY N/A 07/25/2017  ? Procedure: GIVENS CAPSULE STUDY;  Surgeon: [name], MD;  Location: [facility] ENDOSCOPY;  Service: Gastroenterology;  Laterality: N/A;  ? GIVENS CAPSULE STUDY N/A 01/29/2019  ? Procedure: GIVENS CAPSULE STUDY;  Surgeon: [name], MD;  Location: [facility] ENDOSCOPY;  Service: Gastroenterology;  Laterality: N/A;  ? LAPAROSCOPIC RIGHT COLECTOMY Right 08/08/2015  ? Procedure: LAPAROSCOPIC RIGHT COLECTOMY;  Surgeon: Robert Bellow, MD;  Location: [facility] ORS;  Service: General;  Laterality: Right;  ? NASAL SINUS SURGERY    ? PACEMAKER INSERTION Left 08/26/2016  ?  Procedure: INSERTION PACEMAKER;  Surgeon: Isaias Cowman, MD;  Location: [facility] ORS;  Service: Cardiovascular;  Laterality: Left;  ? PROSTATE BIOPSY  01/01/13, 01/30/14  ? Gleason 3+3=6, vol 46.6 cc  ? TONSILLECTOMY    ? uvula surgery    ? for sleep apnea  ? ? ? ?Physical Exam  ? ?Triage Vital Signs: ?ED Triage Vitals [09/10/21 2325]  ?Enc Vitals Group  ?   BP (!) 147/85  ?   Pulse Rate 85  ?   Resp 16  ?   Temp 98 ?F (36.7 ?C)  ?   Temp Source Oral  ?   SpO2 94 %  ?   Weight 218 lb (98.9 kg)  ?   Height '5\' 11"'  (1.803 m)  ?   Head Circumference   ?   Peak Flow   ?   Pain Score 0  ?   Pain Loc   ?   Pain Edu?   ?   Excl. in [place]?   ? ? ?Most recent vital signs: ?Vitals:  ? 09/10/21 2325 09/11/21 0134  ?BP: (!) 147/85 137/86  ?Pulse: 85 70  ?Resp: 16 18  ?Temp: 98 ?F (36.7 ?C)   ?SpO2: 94% 97%  ? ? ? ?Constitutional: Alert and oriented. Well  appearing and in no apparent distress. ?HEENT: ?     Head: Normocephalic and atraumatic.    ?     Eyes: Conjunctivae are normal. Sclera is non-icteric.  ?     Mouth/Throat: Mucous membranes are moist.  ?     Neck: Supple with no signs of meningismus. ?Cardiovascular: Regular rate and rhythm. No murmurs, gallops, or rubs. 2+ symmetrical distal pulses are present in all extremities.  ?Respiratory: Normal respiratory effort. Lungs are clear to auscultation bilaterally.  ?Gastrointestinal: Soft, non tender, and non distended with positive bowel sounds. No rebound or guarding. ?Genitourinary: No CVA tenderness. ?Musculoskeletal:  No edema, cyanosis, or erythema of extremities. ?Neurologic: Normal speech and language. Face is symmetric. Moving all extremities. No gross focal neurologic deficits are appreciated. ?Skin: Skin is warm, dry and intact. No rash noted. ?Psychiatric: Mood and affect are normal. Speech and behavior are normal. ? ?ED Results / Procedures / Treatments  ? ?Labs ?(all labs ordered are listed, but only abnormal results are displayed) ?Labs Reviewed  ?BASIC METABOLIC PANEL - Abnormal; Notable for the following components:  ?    Result Value  ? Creatinine, Ser 1.56 (*)   ? GFR, Estimated 45 (*)   ? All other components within normal limits  ?CBC - Abnormal; Notable for the following components:  ? RBC 6.05 (*)   ? Hemoglobin 17.2 (*)   ? HCT 53.3 (*)   ? RDW 17.9 (*)   ? All other components within normal limits  ?URINALYSIS, COMPLETE (UACMP) WITH MICROSCOPIC - Abnormal; Notable for the following components:  ? Color, Urine RED (*)   ? APPearance CLEAR (*)   ? Glucose, UA   (*)   ? Value: TEST NOT REPORTED DUE TO COLOR INTERFERENCE OF URINE PIGMENT  ? Hgb urine dipstick   (*)   ? Value: TEST NOT REPORTED DUE TO COLOR INTERFERENCE OF URINE PIGMENT  ? Bilirubin Urine   (*)   ? Value: TEST NOT REPORTED DUE TO COLOR INTERFERENCE OF URINE PIGMENT  ? Ketones, ur   (*)   ? Value: TEST NOT REPORTED DUE TO COLOR  INTERFERENCE OF URINE PIGMENT  ? Protein, ur   (*)   ? Value:  TEST NOT REPORTED DUE TO COLOR INTERFERENCE OF URINE PIGMENT  ? Nitrite   (*)   ? Value: TEST NOT REPORTED DUE TO COLOR INTERFERENCE OF URINE PIGMENT  ? Leukocytes,Ua   (*)   ? Value: TEST NOT REPORTED DUE TO COLOR INTERFERENCE OF URINE PIGMENT  ? RBC / HPF >50 (*)   ? WBC, UA >50 (*)   ? All other components within normal limits  ?URINE CULTURE  ?PROTIME-INR  ? ? ? ?EKG ? ?none ? ? ?RADIOLOGY ?I, Rudene Re, attending MD, have personally viewed and interpreted the images obtained during this visit as below: ? ?CT showing renal cysts and renal stones but no ureteral stone or any obvious bleeding from the cysts.  Also patient has a known infrarenal AAA that is status post endovascular graft repair.  That looks stable as well.  Patient has no abdominal pain or back pain. ? ? ?___________________________________________________ ?Interpretation by Radiologist:  ?CT Renal Stone Study ? ?Result Date: 09/11/2021 ?CLINICAL DATA:  Hematuria. Prostate cancer and multiple myeloma. Chronic anticoagulation. EXAM: CT ABDOMEN AND PELVIS WITHOUT CONTRAST TECHNIQUE: Multidetector CT imaging of the abdomen and pelvis was performed following the standard protocol without IV contrast. RADIATION DOSE REDUCTION: This exam was performed according to the departmental dose-optimization program which includes automated exposure control, adjustment of the mA and/or kV according to patient size and/or use of iterative reconstruction technique. COMPARISON:  07/27/2016 FINDINGS: Lower chest: Bibasilar pleural thickening has developed. Pacemaker leads are now seen within the right heart. Mild coronary artery calcification. No pericardial effusion. Hepatobiliary: Stable cyst within the left hepatic lobe. Liver otherwise unremarkable. Gallbladder unremarkable. No intra or extrahepatic biliary ductal dilation. Pancreas: Unremarkable Spleen: Unremarkable Adrenals/Urinary Tract: The  adrenal glands are unremarkable. The kidneys are normal in size and position. Multiple simple cortical cysts are seen bilaterally. No further follow-up imaging is recommended for these lesions. Multiple punctat

## 2021-09-11 NOTE — Discharge Instructions (Signed)
Hold Eliquis for 48 hours. Drink plenty of fluids to help clear the blood in your bladder.  Your CT shows renal cysts and renal stones that could be causing the bleeding.  However with your history of prostate cancer it is very important that you see a urologist as soon as possible.  I have sent a referral to [facility] urology.  If you do not hear from them by Tuesday morning, make sure to call them for an appointment.  Return to the hospital if your bleeding does not subside for the next 48 hours, if you feel like you are going to pass out, if you have pain or burning with urination, fever, abdominal pain, or flank pain. ?

## 2021-09-11 NOTE — ED Notes (Signed)
ED Provider at bedside. 

## 2021-09-11 NOTE — ED Notes (Signed)
Patient transported to CT 

## 2021-09-12 LAB — URINE CULTURE

## 2021-09-15 DIAGNOSIS — I2510 Atherosclerotic heart disease of native coronary artery without angina pectoris: Secondary | ICD-10-CM | POA: Diagnosis not present

## 2021-09-15 DIAGNOSIS — E782 Mixed hyperlipidemia: Secondary | ICD-10-CM | POA: Diagnosis not present

## 2021-09-15 DIAGNOSIS — N200 Calculus of kidney: Secondary | ICD-10-CM | POA: Diagnosis not present

## 2021-09-15 DIAGNOSIS — I10 Essential (primary) hypertension: Secondary | ICD-10-CM | POA: Diagnosis not present

## 2021-09-15 DIAGNOSIS — R319 Hematuria, unspecified: Secondary | ICD-10-CM | POA: Diagnosis not present

## 2021-09-15 DIAGNOSIS — M109 Gout, unspecified: Secondary | ICD-10-CM | POA: Diagnosis not present

## 2021-09-15 DIAGNOSIS — E1122 Type 2 diabetes mellitus with diabetic chronic kidney disease: Secondary | ICD-10-CM | POA: Diagnosis not present

## 2021-09-21 DIAGNOSIS — F17210 Nicotine dependence, cigarettes, uncomplicated: Secondary | ICD-10-CM | POA: Diagnosis not present

## 2021-09-21 DIAGNOSIS — E782 Mixed hyperlipidemia: Secondary | ICD-10-CM | POA: Diagnosis not present

## 2021-09-21 DIAGNOSIS — I340 Nonrheumatic mitral (valve) insufficiency: Secondary | ICD-10-CM | POA: Diagnosis not present

## 2021-09-21 DIAGNOSIS — K219 Gastro-esophageal reflux disease without esophagitis: Secondary | ICD-10-CM | POA: Diagnosis not present

## 2021-09-21 DIAGNOSIS — I4891 Unspecified atrial fibrillation: Secondary | ICD-10-CM | POA: Diagnosis not present

## 2021-09-21 DIAGNOSIS — G4733 Obstructive sleep apnea (adult) (pediatric): Secondary | ICD-10-CM | POA: Diagnosis not present

## 2021-09-21 DIAGNOSIS — I351 Nonrheumatic aortic (valve) insufficiency: Secondary | ICD-10-CM | POA: Diagnosis not present

## 2021-09-21 DIAGNOSIS — I7389 Other specified peripheral vascular diseases: Secondary | ICD-10-CM | POA: Diagnosis not present

## 2021-09-21 DIAGNOSIS — I2510 Atherosclerotic heart disease of native coronary artery without angina pectoris: Secondary | ICD-10-CM | POA: Diagnosis not present

## 2021-10-05 ENCOUNTER — Inpatient Hospital Stay: Payer: Medicare PPO | Attending: Oncology

## 2021-10-05 ENCOUNTER — Inpatient Hospital Stay: Payer: Medicare PPO

## 2021-10-05 DIAGNOSIS — C9000 Multiple myeloma not having achieved remission: Secondary | ICD-10-CM | POA: Diagnosis not present

## 2021-10-05 DIAGNOSIS — D509 Iron deficiency anemia, unspecified: Secondary | ICD-10-CM | POA: Diagnosis not present

## 2021-10-05 DIAGNOSIS — D751 Secondary polycythemia: Secondary | ICD-10-CM | POA: Diagnosis not present

## 2021-10-05 DIAGNOSIS — Z8546 Personal history of malignant neoplasm of prostate: Secondary | ICD-10-CM | POA: Insufficient documentation

## 2021-10-05 DIAGNOSIS — D472 Monoclonal gammopathy: Secondary | ICD-10-CM

## 2021-10-05 LAB — CBC WITH DIFFERENTIAL/PLATELET
Abs Immature Granulocytes: 0.06 10*3/uL (ref 0.00–0.07)
Basophils Absolute: 0.1 10*3/uL (ref 0.0–0.1)
Basophils Relative: 1 %
Eosinophils Absolute: 0.3 10*3/uL (ref 0.0–0.5)
Eosinophils Relative: 3 %
HCT: 53.4 % — ABNORMAL HIGH (ref 39.0–52.0)
Hemoglobin: 17.1 g/dL — ABNORMAL HIGH (ref 13.0–17.0)
Immature Granulocytes: 1 %
Lymphocytes Relative: 13 %
Lymphs Abs: 1.3 10*3/uL (ref 0.7–4.0)
MCH: 28.5 pg (ref 26.0–34.0)
MCHC: 32 g/dL (ref 30.0–36.0)
MCV: 88.9 fL (ref 80.0–100.0)
Monocytes Absolute: 0.8 10*3/uL (ref 0.1–1.0)
Monocytes Relative: 8 %
Neutro Abs: 7.2 10*3/uL (ref 1.7–7.7)
Neutrophils Relative %: 74 %
Platelets: 242 10*3/uL (ref 150–400)
RBC: 6.01 MIL/uL — ABNORMAL HIGH (ref 4.22–5.81)
RDW: 17.8 % — ABNORMAL HIGH (ref 11.5–15.5)
WBC: 9.8 10*3/uL (ref 4.0–10.5)
nRBC: 0 % (ref 0.0–0.2)

## 2021-10-05 NOTE — Progress Notes (Signed)
Confirmed with MD parameters is phlebotomy if HCT is greater than or equal to 55. Today's HCT 53.4, Per parameters no phlebotomy needed, no questions/ complaints. Stable at discharge, refused AVS.   ?

## 2021-10-07 ENCOUNTER — Other Ambulatory Visit: Payer: Self-pay | Admitting: Family Medicine

## 2021-10-07 DIAGNOSIS — I10 Essential (primary) hypertension: Secondary | ICD-10-CM | POA: Diagnosis not present

## 2021-10-07 DIAGNOSIS — M109 Gout, unspecified: Secondary | ICD-10-CM | POA: Diagnosis not present

## 2021-10-07 DIAGNOSIS — N200 Calculus of kidney: Secondary | ICD-10-CM | POA: Diagnosis not present

## 2021-10-07 DIAGNOSIS — M5136 Other intervertebral disc degeneration, lumbar region: Secondary | ICD-10-CM | POA: Diagnosis not present

## 2021-10-07 DIAGNOSIS — M5416 Radiculopathy, lumbar region: Secondary | ICD-10-CM | POA: Diagnosis not present

## 2021-10-07 DIAGNOSIS — M4807 Spinal stenosis, lumbosacral region: Secondary | ICD-10-CM | POA: Diagnosis not present

## 2021-10-09 ENCOUNTER — Telehealth: Payer: Self-pay

## 2021-10-09 NOTE — Telephone Encounter (Signed)
CMN Resubmitted - previous CMN has expired ?

## 2021-10-13 ENCOUNTER — Ambulatory Visit (INDEPENDENT_AMBULATORY_CARE_PROVIDER_SITE_OTHER): Payer: Medicare PPO | Admitting: Urology

## 2021-10-13 ENCOUNTER — Encounter: Payer: Self-pay | Admitting: Urology

## 2021-10-13 VITALS — BP 135/88 | HR 87 | Ht 71.0 in | Wt 218.0 lb

## 2021-10-13 DIAGNOSIS — N138 Other obstructive and reflux uropathy: Secondary | ICD-10-CM

## 2021-10-13 DIAGNOSIS — R32 Unspecified urinary incontinence: Secondary | ICD-10-CM

## 2021-10-13 DIAGNOSIS — N401 Enlarged prostate with lower urinary tract symptoms: Secondary | ICD-10-CM

## 2021-10-13 DIAGNOSIS — Z8546 Personal history of malignant neoplasm of prostate: Secondary | ICD-10-CM

## 2021-10-13 DIAGNOSIS — R310 Gross hematuria: Secondary | ICD-10-CM

## 2021-10-13 LAB — BLADDER SCAN AMB NON-IMAGING: Scan Result: 17

## 2021-10-13 MED ORDER — TAMSULOSIN HCL 0.4 MG PO CAPS: ORAL_CAPSULE | ORAL | 3 refills | Status: DC

## 2021-10-13 NOTE — Progress Notes (Signed)
10/13/21 1:12 PM   Omar [name] [birth year] [id number]  Referring provider:  Sherron Monday, MD [address] [name],  [place] [address] No chief complaint on file.    HPI: Omar [name] is a 81 y.o.male with a personal history of renal cysts, BPH with obstruction, CKD, and prostate cancer, who presents today for a 1 year follow-up with IPSS, PVR, and PSA.   He is s.p IMRT in 2015 for his prostate cancer. His most recent PSA was 0.12 on 08/06/2021.  CT abdomen and pelvis on 08/29/19 revealed multiple nonobstructing bilateral renal calculi and multiple bilateral renal cysts. No obstructive uropathy noted. Report also indicates thickening of bladder wall and metallic markers in his prostate.  He was seen in the ED on 09/10/2021 for red colored urine with blood clots. UA showed >50 RBCs, >50 WBCs, and urine that was red in color. Culture grew multiple species. Serum creatinine during the ED visit was 1.56. CT renal stone study visualized multiple cysts within the kidneys bilaterally, best characterized as Bosniak class 1 cyst. Minimal bilateral nonobstructing nephrolithiasis.  No urolithiasis.  He was having gross hematuria x3  previously before his ED visit. He symptoms have cleared. He is not happy with his urination like he was previously he has been having increased urination during the nighttime.    IPSS     Row Name 10/13/21 1300         International Prostate Symptom Score   How often have you had the sensation of not emptying your bladder? Not at All     How often have you found you stopped and started again several times when you urinated? Less than half the time     How often have you found it difficult to postpone urination? Not at All     How often have you had a weak urinary stream? Less than half the time     How often have you had to strain to start urination? About half the time     How many times did you typically get up at night to urinate? 1 Time      Total IPSS Score 8       Quality of Life due to urinary symptoms   If you were to spend the rest of your life with your urinary condition just the way it is now how would you feel about that? Unhappy              Score:  1-7 Mild 8-19 Moderate 20-35 Severe  PMH: Past Medical History:  Diagnosis Date   Anxiety    Arthritis    rheumatoid   Atrial fibrillation ([facility])    hx of   Cancer ([facility])    Chronic kidney disease    Colon polyp 07-07-15   TUBULAR ADENOMA WITH AT LEAST HIGH-GRADE / Dr Shelle Iron   Diabetes mellitus without complication ([facility])    Dysrhythmia    bradycardia...Marland KitchenMarland Kitchen2 to 1 heart block   GERD (gastroesophageal reflux disease)    Heart murmur    patient unaware of history of murmur   Hypercholesterolemia    Hypertension    Presence of permanent cardiac pacemaker    Prostate cancer ([facility]) 01/01/13, 01/30/14   Gleason 3+4=7, volume 46.6 cc   Rheumatoid arthritis ([facility])    S/P radiation therapy  04/03/2014 through 06/04/2014  Prostate 7800 cGy in 40 sessions                           Sleep apnea    uses cpap but it is not in the hospital with him    Surgical History: Past Surgical History:  Procedure Laterality Date   ABDOMINAL AORTIC ANEURYSM REPAIR  11/2013   COLON SURGERY  March 2017   Right hemicolectomy for tubulovillous adenoma with high-grade dysplasia.   COLONOSCOPY WITH PROPOFOL N/A 07/07/2015   Procedure: COLONOSCOPY WITH PROPOFOL;  Surgeon: Elnita Maxwell, MD;  Location: [facility] ENDOSCOPY;  Service: Endoscopy;  Laterality: N/A;   COLONOSCOPY WITH PROPOFOL N/A 10/28/2016   Procedure: COLONOSCOPY WITH PROPOFOL;  Surgeon: Wyline Mood, MD;  Location: [facility] ENDOSCOPY;  Service: Endoscopy;  Laterality: N/A;   ESOPHAGOGASTRODUODENOSCOPY (EGD) WITH PROPOFOL N/A 10/28/2016   Procedure: ESOPHAGOGASTRODUODENOSCOPY (EGD) WITH PROPOFOL;  Surgeon: Wyline Mood, MD;  Location: [facility] ENDOSCOPY;  Service: Endoscopy;  Laterality:  N/A;   GIVENS CAPSULE STUDY N/A 07/25/2017   Procedure: GIVENS CAPSULE STUDY;  Surgeon: Wyline Mood, MD;  Location: [facility] ENDOSCOPY;  Service: Gastroenterology;  Laterality: N/A;   GIVENS CAPSULE STUDY N/A 01/29/2019   Procedure: GIVENS CAPSULE STUDY;  Surgeon: Wyline Mood, MD;  Location: [facility] ENDOSCOPY;  Service: Gastroenterology;  Laterality: N/A;   LAPAROSCOPIC RIGHT COLECTOMY Right 08/08/2015   Procedure: LAPAROSCOPIC RIGHT COLECTOMY;  Surgeon: Earline Mayotte, MD;  Location: [facility] ORS;  Service: General;  Laterality: Right;   NASAL SINUS SURGERY     PACEMAKER INSERTION Left 08/26/2016   Procedure: INSERTION PACEMAKER;  Surgeon: Marcina Millard, MD;  Location: [facility] ORS;  Service: Cardiovascular;  Laterality: Left;   PROSTATE BIOPSY  01/01/13, 01/30/14   Gleason 3+3=6, vol 46.6 cc   TONSILLECTOMY     uvula surgery     for sleep apnea    Home Medications:  Allergies as of 10/13/2021   No Known Allergies      Medication List        Accurate as of Oct 13, 2021  1:12 PM. If you have any questions, ask your nurse or doctor.          acetaminophen 325 MG tablet Commonly known as: TYLENOL Take 325 mg by mouth every 6 (six) hours as needed.   albuterol 108 (90 Base) MCG/ACT inhaler Commonly known as: VENTOLIN HFA   allopurinol 100 MG tablet Commonly known as: ZYLOPRIM Take by mouth.   ALPRAZolam 0.25 MG tablet Commonly known as: XANAX Take by mouth 2 (two) times daily as needed.   amLODipine-benazepril 5-20 MG capsule Commonly known as: LOTREL Take 1 capsule by mouth daily.   apixaban 2.5 MG Tabs tablet Commonly known as: ELIQUIS Take 2.5 mg by mouth 2 (two) times daily.   atorvastatin 80 MG tablet Commonly known as: LIPITOR Take by mouth.   benazepril 20 MG tablet Commonly known as: LOTENSIN Take 20 mg by mouth daily.   benazepril 10 MG tablet Commonly known as: LOTENSIN Take by mouth.   Butalbital-APAP-Caffeine 50-300-40 MG Caps   Cholecalciferol 1.25 MG  (50000 UT) capsule Take by mouth.   colchicine 0.6 MG tablet Take by mouth.   ezetimibe 10 MG tablet Commonly known as: ZETIA Take 1 tablet by mouth 1 day or 1 dose.   ferrous sulfate 325 (65 FE) MG EC tablet Take 1 tablet (325 mg total) by mouth daily with breakfast.   fexofenadine 180 MG tablet Commonly known as: ALLEGRA Take by  mouth.   fluticasone 50 MCG/ACT nasal spray Commonly known as: FLONASE Place 2 sprays into both nostrils daily as needed for rhinitis.   fluticasone furoate-vilanterol 100-25 MCG/INH Aepb Commonly known as: BREO ELLIPTA Inhale 1 puff into the lungs daily.   furosemide 20 MG tablet Commonly known as: LASIX Take 20 mg by mouth daily.   gabapentin 400 MG capsule Commonly known as: NEURONTIN Take by mouth.   Humira Pen 40 MG/0.4ML Pnkt Generic drug: Adalimumab   hydroxychloroquine 200 MG tablet Commonly known as: PLAQUENIL Take by mouth.   isosorbide mononitrate 30 MG 24 hr tablet Commonly known as: IMDUR Take 30 mg by mouth daily.   Jardiance 25 MG Tabs tablet Generic drug: empagliflozin   loperamide 2 MG capsule Commonly known as: IMODIUM Take 1 capsule (2 mg total) by mouth See admin instructions. With onset of diarrhea, take 4mg  followed by 2mg  every 2 hours resolved. Maximum: 16 mg/day   metoprolol succinate 25 MG 24 hr tablet Commonly known as: TOPROL-XL Take 25 mg by mouth daily.   ondansetron 8 MG tablet Commonly known as: Zofran Take 1 tablet (8 mg total) 2 (two) times daily as needed by mouth (Nausea or vomiting).   OneTouch Ultra test strip Generic drug: glucose blood   pantoprazole 20 MG tablet Commonly known as: PROTONIX Take 20 mg by mouth daily.   tamsulosin 0.4 MG Caps capsule Commonly known as: FLOMAX TAKE 1 CAPSULE BY MOUTH ONCE DAILY AFTER  SUPPER   tiZANidine 2 MG tablet Commonly known as: ZANAFLEX Take 2 mg by mouth 3 (three) times daily.   traMADol-acetaminophen 37.5-325 MG tablet Commonly known  as: ULTRACET   triamcinolone cream 0.1 % Commonly known as: KENALOG Apply 1 application topically 2 (two) times daily.        Allergies:  No Known Allergies  Family History: Family History  Problem Relation Age of Onset   Heart attack Mother    Cirrhosis Father    Pancreatic cancer Brother    Diabetes Brother    Diabetes Daughter        medication induced for cancer treatments   Cancer Daughter        breast, brain    Social History:  reports that he has been smoking cigarettes. He has a 60.00 pack-year smoking history. He has never used smokeless tobacco. He reports current alcohol use of about 1.0 standard drink per week. He reports that he does not use drugs.   Physical Exam: BP 135/88   Pulse 87   Ht 5\' 11"  (1.803 m)   Wt 218 lb (98.9 kg)   BMI 30.40 kg/m   Constitutional:  Alert and oriented, No acute distress. HEENT: [place] AT, moist mucus membranes.  Trachea midline, no masses. Cardiovascular: No clubbing, cyanosis, or edema. Respiratory: Normal respiratory effort, no increased work of breathing. Skin: No rashes, bruises or suspicious lesions. Neurologic: Grossly intact, no focal deficits, moving all 4 extremities. Psychiatric: Normal mood and affect.  Laboratory Data:  Lab Results  Component Value Date   CREATININE 1.56 (H) 09/10/2021   Lab Results  Component Value Date   HGBA1C 6.1 (H) 08/23/2016    Pertinent Imaging: Results for orders placed or performed in visit on 10/13/21  Bladder Scan (Post Void Residual) in office  Result Value Ref Range   Scan Result 17    I reviewed imaging from 09/11/2021 in the form of CT stone.  This is unremarkable in terms of his kidneys which have some very small nonobstructing  stones, large simple cysts.  Bladder and prostate are unremarkable.  Assessment & Plan:  Gross hematuria  - He has had gross hematuria x3 in the absence of documented infection with essentially negative noncontrast CT scan - Will monitor  further with cystoscopy to rule out any malignancy and underlying pathology.   2. BPH with LUTS  -  He is emptying adequately with a PVR of 17ml. -Improved urinary symptoms with Flomax. - Continue Flomax and refilled today.  3. Urinary incontinence - Primarily nighttime   - we talked about additional of medications such as  versus beta 3 agonist versus anticholinergics   -We will hold off for now, pursue cystoscopy to evaluate the above and based on findings, consider adding 1 of these medications  4.  History of prostate cancer -Status post IMRT, PSA remains low - NED  Follow-up cystoscopy  Tawni Millers as a scribe for Vanna Scotland, MD.,have documented all relevant documentation on the behalf of Vanna Scotland, MD,as directed by  Vanna Scotland, MD while in the presence of Vanna Scotland, MD.   [facility] [address] [place], [place] [address] [phone]

## 2021-10-19 ENCOUNTER — Telehealth: Payer: Self-pay

## 2021-10-19 NOTE — Telephone Encounter (Signed)
CMN received and is valid. Patient to be called for evaluation for diabetic shoes ?

## 2021-10-21 ENCOUNTER — Ambulatory Visit: Payer: Medicare PPO

## 2021-10-28 ENCOUNTER — Ambulatory Visit (INDEPENDENT_AMBULATORY_CARE_PROVIDER_SITE_OTHER): Payer: Medicare PPO | Admitting: Urology

## 2021-10-28 VITALS — BP 140/80 | HR 70 | Ht 71.0 in | Wt 218.0 lb

## 2021-10-28 DIAGNOSIS — N401 Enlarged prostate with lower urinary tract symptoms: Secondary | ICD-10-CM | POA: Diagnosis not present

## 2021-10-28 DIAGNOSIS — N3289 Other specified disorders of bladder: Secondary | ICD-10-CM

## 2021-10-28 DIAGNOSIS — R310 Gross hematuria: Secondary | ICD-10-CM | POA: Diagnosis not present

## 2021-10-28 DIAGNOSIS — N138 Other obstructive and reflux uropathy: Secondary | ICD-10-CM | POA: Diagnosis not present

## 2021-10-28 NOTE — H&P (View-Only) (Signed)
   10/28/21  CC:  Chief Complaint  Patient presents with   Cysto     HPI: Omar [name] is a 81 y.o. male with a personal history of gross hematuria x3  renal cysts, BPH with obstruction, CKD, and prostate cancer, who presents today for a cystoscopy.    He is s/p IMRT in 2015 for his prostate cancer. His most recent PSA was 0.12 on 08/06/2021.  Recent upper tract imaging in the form of CT stone protocol on 10/01/2021 which was fairly unremarkable.    Vitals:   10/28/21 1040  BP: 140/80  Pulse: 70   NED. A&Ox3.   No respiratory distress   Abd soft, NT, ND Normal phallus with bilateral descended testicles  Cystoscopy Procedure Note  Patient identification was confirmed, informed consent was obtained, and patient was prepped using Betadine solution.  Lidocaine jelly was administered per urethral meatus.     Pre-Procedure: - Inspection reveals a normal caliber ureteral meatus.  Procedure: The flexible cystoscope was introduced without difficulty - No urethral strictures/lesions are present. - Normal prostate  -  bladder neck - Bilateral ureteral orifices identified - Bladder mucosa  reveals erythematous  changes with clusters of enlarged blood vessels mulberry shaped particularly at right bladder neck mostly consistent with radiation cystitis although cannot rule out underlying malignancy, approximately 1 cm in largest diameter along with additional satellite lesions - No bladder stones - No trabeculation  Retroflexion shows erythematous changes with clusters of enlarged blood vessels above  Post-Procedure: - Patient tolerated the procedure well   Assessment/ Plan:  Bladder lesions  - Cystoscopy today revealed clusters of enlarged blood vessels consistent with radiation cystitis but it cannot be ruled out that this is not a malignancy.  - Recommend proceeding to the OR for bladder biopsy, bilateral retrograde pyelogram.  He agreeable with this plan. -Risk and  benefits include bleeding, faction damage surrounding structures, need for Foley catheter, amongst others were discussed.  Schedule outpatient bladder biopsy, bilateral retrograde  Conley Rolls as a scribe for Hollice Espy, MD.,have documented all relevant documentation on the behalf of Hollice Espy, MD,as directed by  Hollice Espy, MD while in the presence of Hollice Espy, MD.  I have reviewed the above documentation for accuracy and completeness, and I agree with the above.   Hollice Espy, MD

## 2021-10-28 NOTE — Progress Notes (Signed)
   10/28/21  CC:  Chief Complaint  Patient presents with   Cysto     HPI: Omar [name] is a 81 y.o. male with a personal history of gross hematuria x3  renal cysts, BPH with obstruction, CKD, and prostate cancer, who presents today for a cystoscopy.    He is s/p IMRT in 2015 for his prostate cancer. His most recent PSA was 0.12 on 08/06/2021.  Recent upper tract imaging in the form of CT stone protocol on 10/01/2021 which was fairly unremarkable.    Vitals:   10/28/21 1040  BP: 140/80  Pulse: 70   NED. A&Ox3.   No respiratory distress   Abd soft, NT, ND Normal phallus with bilateral descended testicles  Cystoscopy Procedure Note  Patient identification was confirmed, informed consent was obtained, and patient was prepped using Betadine solution.  Lidocaine jelly was administered per urethral meatus.     Pre-Procedure: - Inspection reveals a normal caliber ureteral meatus.  Procedure: The flexible cystoscope was introduced without difficulty - No urethral strictures/lesions are present. - Normal prostate  -  bladder neck - Bilateral ureteral orifices identified - Bladder mucosa  reveals erythematous  changes with clusters of enlarged blood vessels mulberry shaped particularly at right bladder neck mostly consistent with radiation cystitis although cannot rule out underlying malignancy, approximately 1 cm in largest diameter along with additional satellite lesions - No bladder stones - No trabeculation  Retroflexion shows erythematous changes with clusters of enlarged blood vessels above  Post-Procedure: - Patient tolerated the procedure well   Assessment/ Plan:  Bladder lesions  - Cystoscopy today revealed clusters of enlarged blood vessels consistent with radiation cystitis but it cannot be ruled out that this is not a malignancy.  - Recommend proceeding to the OR for bladder biopsy, bilateral retrograde pyelogram.  He agreeable with this plan. -Risk and  benefits include bleeding, faction damage surrounding structures, need for Foley catheter, amongst others were discussed.  Schedule outpatient bladder biopsy, bilateral retrograde  Conley Rolls as a scribe for Hollice Espy, MD.,have documented all relevant documentation on the behalf of Hollice Espy, MD,as directed by  Hollice Espy, MD while in the presence of Hollice Espy, MD.  I have reviewed the above documentation for accuracy and completeness, and I agree with the above.   Hollice Espy, MD

## 2021-10-29 ENCOUNTER — Other Ambulatory Visit: Payer: Self-pay | Admitting: Urology

## 2021-10-29 ENCOUNTER — Ambulatory Visit: Payer: Medicare PPO | Admitting: Podiatry

## 2021-10-29 ENCOUNTER — Encounter: Payer: Self-pay | Admitting: Podiatry

## 2021-10-29 DIAGNOSIS — R310 Gross hematuria: Secondary | ICD-10-CM

## 2021-10-29 DIAGNOSIS — E1140 Type 2 diabetes mellitus with diabetic neuropathy, unspecified: Secondary | ICD-10-CM

## 2021-10-29 DIAGNOSIS — M79609 Pain in unspecified limb: Secondary | ICD-10-CM

## 2021-10-29 DIAGNOSIS — I739 Peripheral vascular disease, unspecified: Secondary | ICD-10-CM

## 2021-10-29 DIAGNOSIS — B351 Tinea unguium: Secondary | ICD-10-CM

## 2021-10-29 DIAGNOSIS — D689 Coagulation defect, unspecified: Secondary | ICD-10-CM | POA: Diagnosis not present

## 2021-10-29 DIAGNOSIS — M2011 Hallux valgus (acquired), right foot: Secondary | ICD-10-CM | POA: Diagnosis not present

## 2021-10-29 DIAGNOSIS — M2012 Hallux valgus (acquired), left foot: Secondary | ICD-10-CM | POA: Diagnosis not present

## 2021-10-29 LAB — URINALYSIS, COMPLETE
Bilirubin, UA: NEGATIVE
Ketones, UA: NEGATIVE
Leukocytes,UA: NEGATIVE
Nitrite, UA: NEGATIVE
Protein,UA: NEGATIVE
RBC, UA: NEGATIVE
Specific Gravity, UA: 1.01 (ref 1.005–1.030)
Urobilinogen, Ur: 0.2 mg/dL (ref 0.2–1.0)
pH, UA: 5.5 (ref 5.0–7.5)

## 2021-10-29 LAB — MICROSCOPIC EXAMINATION: Bacteria, UA: NONE SEEN

## 2021-10-29 NOTE — Progress Notes (Signed)
Surgical Physician Order Form [facility]  * Scheduling expectation : Next Available  *Length of Case:   *Clearance needed: yes  *Anticoagulation Instructions: May continue all anticoagulants  *Aspirin Instructions: Ok to continue Aspirin  *Post-op visit Date/Instructions:   TBD  *Diagnosis:  Gross hematuria  *Procedure: bilateral  retrograde pyelogram, bladder biopsy   Additional orders: N/A  -Admit type: OUTpatient  -Anesthesia: General  -VTE Prophylaxis Standing Order SCD's       Other:   -Standing Lab Orders Per Anesthesia    Lab other: None  -Standing Test orders EKG/Chest x-ray per Anesthesia       Test other:   - Medications:  Ancef 2gm IV  -Other orders:  N/A

## 2021-10-29 NOTE — Progress Notes (Signed)
This patient returns to my office for at risk foot care.  This patient requires this care by a professional since this patient will be at risk due to having PAD and diabetes. And coagulation defect.  Patient is taking eliquiss.  This patient is unable to cut nails himself since the patient cannot reach his nails.These nails are painful walking and wearing shoes.  This patient presents for at risk foot care today.  Patient has self inflicted cut along the medial border right big toe.  General Appearance  Alert, conversant and in no acute stress.  Vascular  Dorsalis pedis and posterior tibial  pulses are not  palpable  bilaterally.  Capillary return is within normal limits  bilaterally. Temperature is within normal limits  bilaterally.  Neurologic  Senn-Weinstein monofilament wire test within normal limits  bilaterally. Muscle power within normal limits bilaterally.  Nails Thick disfigured discolored nails with subungual debris  from hallux to fifth toes bilaterally. No evidence of bacterial infection or drainage bilaterally.  Orthopedic  No limitations of motion  feet .  No crepitus or effusions noted.  No bony pathology or digital deformities noted.  HAV wuth hammer toe  B/L.  Rigid contracted second digit right foot.  Skin  normotropic skin with no porokeratosis noted bilaterally.  No signs of infections or ulcers noted.     Onychomycosis  Pain in right toes  Pain in left toes  Consent was obtained for treatment procedures.   Mechanical debridement of nails 1-5  bilaterally performed with a nail nipper.  Filed with dremel without incident. Self inflicted cut is healing.     Return office visit    10 weeks                 Told patient to return for periodic foot care and evaluation due to potential at risk complications.   Asberry Lascola DPM  

## 2021-10-30 ENCOUNTER — Telehealth: Payer: Self-pay | Admitting: Urgent Care

## 2021-10-30 ENCOUNTER — Telehealth: Payer: Self-pay

## 2021-10-30 NOTE — Telephone Encounter (Signed)
I spoke with Omar [name]. We have discussed possible surgery dates and Monday June 12th, 2023 was agreed upon by all parties. Patient given information about surgery date, what to expect pre-operatively and post operatively.  We discussed that a Pre-Admission Testing office will be calling to set up the pre-op visit that will take place prior to surgery, and that these appointments are typically done over the phone with a Pre-Admissions RN.  Informed patient that our office will communicate any additional care to be provided after surgery. Patients questions or concerns were discussed during our call. Advised to call our office should there be any additional information, questions or concerns that arise. Patient verbalized understanding.

## 2021-10-30 NOTE — Progress Notes (Signed)
  Perioperative Services Pre-Admission/Anesthesia Testing     Date: 10/30/21  Name: Omar [name] MRN:   [id number]  Re: Request from surgery for clearance prior to scheduled procedure  Patient is scheduled to undergo a CYSTOSCOPY WITH BLADDER BIOPSY; RETROGRADE PYELOGRAM on 11/16/2021 with Dr. Hollice Espy, MD. Patient has not been scheduled for his PAT appointment at this point, thus has not undergone review by PAT RN and/or APP. Received communication from primary attending surgeon's office requesting that patient be submitted for clearance from cardiology.   PROVIDER SPECIALTY FAXED TO   Neoma Laming, MD  Cardiology  [phone]   Plan:  Clearance documents generated and faxed to appropriate provider(s) as noted above. Note will be updated to reflect communication with provider's office as it relates to clearance being provided and/or the need for office visit prior to clearance for surgery being issued.   Honor Loh, MSN, APRN, FNP-C, CEN [facility]  Peri-operative Services Nurse Practitioner Phone: [phone] 10/30/21 1:13 PM  NOTE: This note has been prepared using Dragon dictation software. Despite my best ability to proofread, there is always the potential that unintentional transcriptional errors may still occur from this process.

## 2021-10-30 NOTE — Progress Notes (Signed)
[facility] Posting Form   Surgery Date/Time: Date: 11/16/2021  Surgeon: Dr. Hollice Espy, MD  Surgery Location: Day Surgery  Inpt ( No  )   Outpt (Yes)   Obs ( No  )   Diagnosis: Gross Hematuria R31.0  -CPT: 22411, [phone]   Surgery: Cystoscopy with Bladder Biopsy with bilateral retrograde pyelograms  Stop Anticoagulations: Yes and may continue ASA  Cardiac/Medical/Pulmonary Clearance needed: yes  From: Dr. Humphrey Rolls, Submitted by Honor Loh, NP  *Orders entered into EPIC  Date: 10/30/21   *Case booked in EPIC  Date: 10/30/21  *Notified pt of Surgery: Date: 10/30/21  PRE-OP UA & CX: no  *Placed into Prior Authorization Work Que Date: 10/30/21   Assistant/laser/rep:No

## 2021-11-04 ENCOUNTER — Telehealth: Payer: Self-pay | Admitting: Urgent Care

## 2021-11-04 NOTE — Progress Notes (Signed)
  Perioperative Services Pre-Admission/Anesthesia Testing     Date: 11/04/21  Name: Omar [name] MRN:   [id number]  Re: Surgical clearance  Surgical clearance received from Dr. April Manson office (cardiology). Patient has been cleared for the planned [place] BIOPSY procedure scheduled for 11/16/2021 with Dr. Hollice Espy, MD.  Cardiology notes that patient may proceed with an overall MODERATE risk stratification. He has been cleared to hold his apixaban dose for 3 days prior to his procedure with plans to restart as soon as postoperative bleeding risk to be minimized by Dr. Erlene Quan. Copy of signed clearance form placed on patient's OR chart for review by the surgical/anesthetic team on the day of his procedure.   Honor Loh, MSN, APRN, FNP-C, CEN [facility]  Peri-operative Services Nurse Practitioner Phone: [phone] 11/04/21 4:16 PM

## 2021-11-10 ENCOUNTER — Encounter
Admission: RE | Admit: 2021-11-10 | Discharge: 2021-11-10 | Disposition: A | Discharge status: Home / Self Care | Payer: Medicare PPO | Source: Ambulatory Visit | Attending: Urology | Admitting: Urology

## 2021-11-10 HISTORY — DX: Multiple myeloma not having achieved remission: C90.00

## 2021-11-10 HISTORY — DX: Iron deficiency anemia, unspecified: D50.9

## 2021-11-10 HISTORY — DX: Chronic obstructive pulmonary disease, unspecified: J44.9

## 2021-11-10 HISTORY — DX: Heart failure, unspecified: I50.9

## 2021-11-10 HISTORY — DX: Inflammatory liver disease, unspecified: K75.9

## 2021-11-10 HISTORY — DX: Gout, unspecified: M10.9

## 2021-11-10 HISTORY — DX: Atherosclerotic heart disease of native coronary artery without angina pectoris: I25.10

## 2021-11-10 NOTE — Patient Instructions (Addendum)
Your procedure is scheduled on: Monday, June 12 Report to the Registration Desk on the 1st floor of the [facility]. To find out your arrival time, please call [phone] between 1PM - 3PM on: Friday, June 9 If your arrival time is 6:00 am, do not arrive prior to that time as the [facility] entrance doors do not open until 6:00 am.  REMEMBER: Instructions that are not followed completely may result in serious medical risk, up to and including death; or upon the discretion of your surgeon and anesthesiologist your surgery may need to be rescheduled.  Do not eat or drink after midnight the night before surgery.  No gum chewing, lozengers or hard candies.  TAKE THESE MEDICATIONS THE MORNING OF SURGERY WITH A SIP OF WATER:  Albuterol inhaler Atorvastatin Ezetimibe Breo-ellipta inhaler Gabapentin Isosorbide mononitrate Pantoprazole - (take one the night before and one on the morning of surgery - helps to prevent nausea after surgery.) Tamsulosin  Use inhalers on the day of surgery and bring to the hospital.  Jardiance - hold 3 days prior to surgery. Last day to take Jardiance is Thursday, June 8. Resume AFTER surgery.  Follow recommendations from Cardiologist regarding stopping Eliquis (apixaban). According to Dr Chauncey Cruel. Laurelyn Sickle note; hold Eliquis for 3 days prior to surgery. The last day to take Eliquis is Thursday, June 8. Resume after surgery per surgeon instructions.  One week prior to surgery: starting June 5 Stop Anti-inflammatories (NSAIDS) such as Advil, Aleve, Ibuprofen, Motrin, Naproxen, Naprosyn and Aspirin based products such as Excedrin, Goodys Powder, BC Powder. Stop ANY OVER THE COUNTER supplements until after surgery. You may however, continue to take Tylenol if needed for pain up until the day of surgery.  No Alcohol for 24 hours before or after surgery.  No Smoking including e-cigarettes for 24 hours prior to surgery.  No chewable tobacco products for at least 6  hours prior to surgery.  No nicotine patches on the day of surgery.  On the morning of surgery brush your teeth with toothpaste and water, you may rinse your mouth with mouthwash if you wish. Do not swallow any toothpaste or mouthwash.  Do not wear jewelry, make-up, hairpins, clips or nail polish.  Do not wear lotions, powders, or perfumes.   Do not shave body from the neck down 48 hours prior to surgery just in case you cut yourself which could leave a site for infection.   Contact lenses, hearing aids and dentures may not be worn into surgery.  Do not bring valuables to the hospital. [facility] is not responsible for any missing/lost belongings or valuables.   Bring your C-PAP to the hospital with you in case you may have to spend the night.   Notify your doctor if there is any change in your medical condition (cold, fever, infection).  Wear comfortable clothing (specific to your surgery type) to the hospital.  After surgery, you can help prevent lung complications by doing breathing exercises.  Take deep breaths and cough every 1-2 hours. Your doctor may order a device called an Incentive Spirometer to help you take deep breaths.  If you are being discharged the day of surgery, you will not be allowed to drive home. You will need a responsible adult (18 years or older) to drive you home and stay with you that night.   If you are taking public transportation, you will need to have a responsible adult (18 years or older) with you. Please confirm with your physician  that it is acceptable to use public transportation.   Please call the [facility] at [phone] if you have any questions about these instructions.  Surgery Visitation Policy:  Patients undergoing a surgery or procedure may have two family members or support persons with them as long as the person is not COVID-19 positive or experiencing its symptoms.

## 2021-11-13 ENCOUNTER — Encounter: Payer: Self-pay | Admitting: Urology

## 2021-11-13 NOTE — Progress Notes (Signed)
Perioperative Services  Pre-Admission/Anesthesia Testing Clinical Review  Date: 11/13/21  Patient Demographics:  Name: Omar [name] DOB:   1941/04/27 MRN:   [id number]  Planned Surgical Procedure(s):    Case: [id number] Date/Time: 11/16/21 1311   Procedures:      CYSTOSCOPY WITH BLADDER BIOPSY     CYSTOSCOPY WITH RETROGRADE PYELOGRAM (Bilateral)   Anesthesia type: General   Pre-op diagnosis: Gross Hematuria   Location: [facility] OR ROOM 10 / [place] ORS FOR ANESTHESIA GROUP   Surgeons: Hollice Espy, MD   NOTE: Available PAT nursing documentation and vital signs have been reviewed. Clinical nursing staff has updated patient's PMH/PSHx, current medication list, and drug allergies/intolerances to ensure comprehensive history available to assist in medical decision making as it pertains to the aforementioned surgical procedure and anticipated anesthetic course. Extensive review of available clinical information performed. [place] [facility] and PSHx updated with any diagnoses/procedures that  may have been inadvertently omitted during his intake with the pre-admission testing department's nursing staff.  Clinical Discussion:  Omar [name] is a 81 y.o. male who is submitted for pre-surgical anesthesia review and clearance prior to him undergoing the above procedure. Patient is a Current Smoker (60 pack years). Pertinent PMH includes: CAD, atrial fibrillation, CHF, second-degree AV block (s/p PPM placement), PVD, aortic atherosclerosis, cardiac murmur, HTN, HLD, T2DM, GERD (on daily PPI), COPD, OSAH (requires nocturnal PAP therapy), CKD-III, IDA, multiple myeloma, prostate cancer (s/p XRT), RA, BPH.  Patient is followed by cardiology Omar Rolls, MD). He was last seen in the cardiology clinic on 09/21/2021; notes reviewed.  At the time of his clinic visit, patient doing well overall from a cardiovascular perspective.  He denied any episodes of chest pain, PND, orthopnea, palpitations, significant  peripheral edema, vertiginous symptoms, or presyncope/syncope.  Patient with chronic baseline dyspnea related to his ongoing smoking history and underlying COPD diagnosis.  Patient with a past medical history significant for cardiovascular diagnoses.  Patient underwent endovascular AAA repair (EVAR) on 11/05/2013.  Aneurysmal dilatation has been serially monitored status post repair.  Last imaging on 07/03/2021 revealed the largest area of aneurysmal dilatation measuring 3.9 cm.  Coronary CTA performed on 01/10/2015 revealing a calcium score of 722.3.  Patient with a RIGHT dominant system.  There was moderate to severe mid LAD disease, mild LCx disease, and 50-60% disease and the RCA.  Patient with a history of sick sinus syndrome with second-degree AV block with periods of intermittent third-degree AV block.  He underwent  Medtronic PPM placement on 08/26/2016.  Myocardial perfusion imaging study performed on 03/13/2021 revealing a normal left ventricular systolic function with an EF of 76%.  There was a moderate fixed basal inferior, mid inferior, and apex location perfusion defect noted.  Wall motion determined to be normal.  Study interpreted as equivocal.  TTE performed on 03/16/2021 revealed a normal left ventricular systolic function with an EF of 63%. Diastolic Doppler parameters consistent with abnormal relaxation (G1DD).  There was mild to moderate pan valvular regurgitation.  There was no evidence of a significant transvalvular gradient to suggest stenosis.  Patient with an atrial fibrillation diagnosis; CHA2DS2-VASc Score = 6 (age x 2, CHF, HTN, PVD/aortic plaque, T2DM).  Rate and rhythm maintained without the use of pharmacological intervention.  Given his age and renal function, patient is chronically anticoagulated using dose reduced apixaban; compliant with therapy with no evidence or reports of GI bleeding.  Blood pressure well controlled at 124/68 on prescribed ACEi and nitrate  therapies.  Patient is on a  statin + ezetimibe for his HLD diagnosis and further ASCVD prevention.  T2DM well-controlled per patient report.  With that being said, I do not have access to his last hemoglobin A1c.  Of note, patient is on a SGLT2i (empagliflozin) in the setting of known T2DM with concurrent CHF.  Patient has an OSAH diagnosis and is reported to be compliant with prescribed nocturnal PAP therapy.  Functional capacity somewhat limited by age and multiple medical comorbidities, however patient still felt to be able to achieve at least 4 METS of activity without angina/anginal symptoms.  Patient has PPM in place; device regularly interrogated per cardiology recommendations. No changes were made to his medication regimen.  Patient to follow-up with his outpatient cardiologist in 6 months or sooner if needed.  Omar [name] is scheduled for an CYSTOSCOPY WITH BLADDER BIOPSY; BILATERAL RETROGRADE PYELOGRAM on 11/16/2021 with Dr. Hollice Espy, MD.  Given patient's past medical history significant for cardiovascular diagnoses, presurgical cardiac clearance was sought by the PAT team. Per cardiology, "this patient is optimized for surgery and may proceed with the planned procedural course with a MODERATE risk of significant perioperative cardiovascular complications".  Again, this patient is on daily apixaban therapy.  He has been instructed on recommendations from his cardiologist for holding his apixaban for 3 days prior to his procedure with plans to restart since postoperative bleeding risk felt to be minimized by his primary attending surgeon.  The patient is aware that his last dose of apixaban should be on 11/12/2021.  Patient denies previous perioperative complications with anesthesia in the past. In review of the available records, it is noted that patient underwent a MAC anesthetic course at [facility] ([facility]A [facility]) in 06/2019 without documented complications.       11/10/2021    4:32 PM 10/28/2021   10:40 AM 10/13/2021   11:29 AM  Vitals with BMI  Height _0  _1  _2   Weight 218 lbs 218 lbs 218 lbs  BMI 30.42 54.00 86.76  Systolic  195 093  Diastolic  80 88  Pulse  70 87    Providers/Specialists:   NOTE: Primary physician provider listed below. Patient may have been seen by APP or partner within same practice.   PROVIDER ROLE / SPECIALTY LAST Lu Duffel, MD Hollice Espy (Surgeon) 10/29/2021  Omar Marble, MD Primary Care Provider ???  Omar Foster, MD Cardiology 09/21/2021   Allergies:  Patient has no known allergies.  Current Home Medications:   No current facility-administered medications for this encounter.    albuterol (PROVENTIL HFA;VENTOLIN HFA) 108 (90 Base) MCG/ACT inhaler   allopurinol (ZYLOPRIM) 100 MG tablet   ALPRAZolam (XANAX) 0.25 MG tablet   apixaban (ELIQUIS) 2.5 MG TABS tablet   atorvastatin (LIPITOR) 80 MG tablet   benazepril (LOTENSIN) 10 MG tablet   Butalbital-APAP-Caffeine 50-300-40 MG CAPS   Cholecalciferol 1.25 MG (50000 UT) capsule   ezetimibe (ZETIA) 10 MG tablet   fluticasone (FLONASE) 50 MCG/ACT nasal spray   fluticasone furoate-vilanterol (BREO ELLIPTA) 100-25 MCG/INH AEPB   gabapentin (NEURONTIN) 400 MG capsule   HUMIRA PEN 40 MG/0.4ML PNKT   isosorbide mononitrate (IMDUR) 60 MG 24 hr tablet   JARDIANCE 25 MG TABS tablet   pantoprazole (PROTONIX) 20 MG tablet   tamsulosin (FLOMAX) 0.4 MG CAPS capsule   traMADol (ULTRAM) 50 MG tablet   History:   Past Medical History:  Diagnosis Date   AAA (abdominal aortic aneurysm) (HCC)    a.) s/p EVAR  11/05/2013.   Anxiety    a.) on BZO (alprazolam) PRN   Aortic atherosclerosis (HCC)    Atrial fibrillation (HCC)    a.) CHA2DS2-VASc = 6 (age x2, CHF, HTN, PVD/aortic plaque, T2DM). b.) rate/rhythm maintained without use of pharmacological intervention; chronically anticoagulated using dose reduced apixaban.   BPH (benign prostatic  hyperplasia)    CHF (congestive heart failure) ([place])    a.)  TTE 03/16/2021: EF 63%; moderate LVH; mild BAE mild to moderate pan valvular regurgitation; G1DD.   CKD (chronic kidney disease), stage III (HCC)    Colon polyp 07/07/2015   TUBULAR ADENOMA WITH AT LEAST HIGH-GRADE / Dr Rayann Heman   COPD (chronic obstructive pulmonary disease) ([place])    Coronary artery disease    a.) coronary CTA 01/10/2015 CA score 722.3; mod-sev LAD disease; mild LCx disease; 50-60% RCA disease.   Current use of long term anticoagulation    a.) dose reduced apixaban   Diverticulosis    Erectile dysfunction    GERD (gastroesophageal reflux disease)    Gout    Heart murmur    Hepatic cyst    Hepatic steatosis    Hepatitis    Hypercholesterolemia    Hypertension    Iron deficiency anemia    Migraines    Multiple myeloma (HCC)    Nephrolithiasis    OSA on CPAP    Presence of permanent cardiac pacemaker    Prostate cancer ([place]) 01/01/13, 01/30/14   Gleason 3+4=7, volume 46.6 cc   PVD (peripheral vascular disease) ([place])    Renal cyst    Rheumatoid arthritis ([place])    S/P radiation therapy  04/03/2014 through 06/04/2014                                                      Prostate 7800 cGy in 40 sessions                           SSS (sick sinus syndrome) ([place])    a.) s/p Medtronic Azure PPM placement 08/26/2016   T2DM (type 2 diabetes mellitus) ([place])    Third degree heart block ([place]) 08/26/2016   a.) s/p PPM placement (Medtronic Azure XT DR MRI [id number] S/N: [id number]) on 08/26/2016   Vitamin D deficiency    Past Surgical History:  Procedure Laterality Date   ABDOMINAL AORTIC ANEURYSM REPAIR  11/2013   COLON SURGERY  March 2017   Right hemicolectomy for tubulovillous adenoma with high-grade dysplasia.   COLONOSCOPY WITH PROPOFOL N/A 07/07/2015   Procedure: COLONOSCOPY WITH PROPOFOL;  Surgeon: Josefine Class, MD;  Location: [facility] ENDOSCOPY;  Service: Endoscopy;  Laterality: N/A;   COLONOSCOPY WITH PROPOFOL  N/A 10/28/2016   Procedure: COLONOSCOPY WITH PROPOFOL;  Surgeon: [name], MD;  Location[facility] ENDOSCOPY;  Service: Endoscopy;  Laterality: N/A;   ESOPHAGOGASTRODUODENOSCOPY (EGD) WITH PROPOFOL N/A 10/28/2016   Procedure: ESOPHAGOGASTRODUODENOSCOPY (EGD) WITH PROPOFOL;  Surgeon: [name], MD;  Location: [facility] ENDOSCOPY;  Service: Endoscopy;  Laterality: N/A;   GIVENS CAPSULE STUDY N/A 07/25/2017   Procedure: GIVENS CAPSULE STUDY;  Surgeon: [name], MD;  Location: [facility] ENDOSCOPY;  Service: Gastroenterology;  Laterality: N/A;   GIVENS CAPSULE STUDY N/A 01/29/2019   Procedure: GIVENS CAPSULE STUDY;  Surgeon: [name], MD;  Location: [facility] ENDOSCOPY;  Service: Gastroenterology;  Laterality: N/A;  LAPAROSCOPIC RIGHT COLECTOMY Right 08/08/2015   Procedure: LAPAROSCOPIC RIGHT COLECTOMY;  Surgeon: Robert Bellow, MD;  Location: [facility] ORS;  Service: General;  Laterality: Right;   NASAL SINUS SURGERY     PACEMAKER INSERTION Left 08/26/2016   Procedure: INSERTION PACEMAKER;  Surgeon: Isaias Cowman, MD;  Location: [facility] ORS;  Service: Cardiovascular;  Laterality: Left;   PROSTATE BIOPSY  01/01/13, 01/30/14   Gleason 3+3=6, vol 46.6 cc   TONSILLECTOMY     uvula surgery     for sleep apnea   Family History  Problem Relation Age of Onset   Heart attack Mother    Cirrhosis Father    Pancreatic cancer Brother    Diabetes Brother    Diabetes Daughter        medication induced for cancer treatments   Cancer Daughter        breast, brain   Social History   Tobacco Use   Smoking status: Every Day    Packs/day: 1.00    Years: 60.00    Total pack years: 60.00    Types: Cigarettes   Smokeless tobacco: Never   Tobacco comments:    DOWN TO 1/2 PPD  Vaping Use   Vaping Use: Never used  Substance Use Topics   Alcohol use: Yes    Alcohol/week: 1.0 standard drink of alcohol    Types: 1 Cans of beer per week    Comment: occasionally   Drug use: No    Pertinent Clinical Results:  LABS:  Labs reviewed: Acceptable for surgery.  Lab Results  Component Value Date   WBC 9.8 10/05/2021   HGB 17.1 (H) 10/05/2021   HCT 53.4 (H) 10/05/2021   MCV 88.9 10/05/2021   PLT 242 10/05/2021   Lab Results  Component Value Date   NA 138 09/10/2021   K 4.3 09/10/2021   CO2 22 09/10/2021   GLUCOSE 89 09/10/2021   BUN 17 09/10/2021   CREATININE 1.56 (H) 09/10/2021   CALCIUM 9.1 09/10/2021   GFRNONAA 45 (L) 09/10/2021   Component Date Value Ref Range Status   Specific Gravity, UA 10/28/2021 1.010  1.005 - 1.030 Final   pH, UA 10/28/2021 5.5  5.0 - 7.5 Final   Color, UA 10/28/2021 Yellow  Yellow Final   Appearance Ur 10/28/2021 Clear  Clear Final   Leukocytes,UA 10/28/2021 Negative  Negative Final   Protein,UA 10/28/2021 Negative  Negative/Trace Final   Glucose, UA 10/28/2021 2+ (A)  Negative Final   Ketones, UA 10/28/2021 Negative  Negative Final   RBC, UA 10/28/2021 Negative  Negative Final   Bilirubin, UA 10/28/2021 Negative  Negative Final   Urobilinogen, Ur 10/28/2021 0.2  0.2 - 1.0 mg/dL Final   Nitrite, UA 10/28/2021 Negative  Negative Final   Microscopic Examination 10/28/2021 See below:   Final   WBC, UA 10/28/2021 0-5  0 - 5 /hpf Final   RBC 10/28/2021 0-2  0 - 2 /hpf Final   Epithelial Cells (non renal) 10/28/2021 0-10  0 - 10 /hpf Final   Bacteria, UA 10/28/2021 None seen  None seen/Few Final    ECG: Date: 08/12/2021 Time ECG obtained: 0925 AM Rate: 84 bpm Rhythm: Atrial sensed ventricular paced rhythm Axis (leads I and aVF): Normal Intervals: PR 180 ms. QRS 166 ms. QTc 522 ms. ST segment and T wave changes: No evidence of acute ST segment elevation or depression Comparison: Similar to previous tracing obtained on 08/29/2018   IMAGING / PROCEDURES: TRANSTHORACIC ECHOCARDIOGRAM performed on 03/16/2021 Normal left  ventricular systolic function with an EF of 63% Normal regional wall motion Diastolic Doppler parameters consistent with abnormal relaxation  (G1DD). Mild aortic valve regurgitation Mild mitral valve regurgitation Trace pulmonic valve regurgitation Mild to moderate tricuspid valve regurgitation No evidence of a significant transvalvular gradient to suggest stenosis No pericardial effusion Aortic root diameter 3.9 cm  MYOCARDIAL PERFUSION IMAGING STUDY (LEXISCAN) performed on 03/13/2021 Normal left ventricular systolic function with an EF of 76% Moderate size and intensity fixed basal inferior, mid inferior, and apex wall perfusion defects Normal wall motion Equivocal stress test  CORONARY CTA performed on 01/10/2015 Coronary calcium score 722.3 RIGHT dominant system Moderate to severe mid LAD disease Mild diffuse LCx disease RCA has 50-60% disease, but cannot be certain due to stent artifact in the mid RCA  Impression and Plan:  BLAYZE HAEN has been referred for pre-anesthesia review and clearance prior to him undergoing the planned anesthetic and procedural courses. Available labs, pertinent testing, and imaging results were personally reviewed by me. This patient has been appropriately cleared by cardiology with an overall MODERATE risk of significant perioperative cardiovascular complications. Completed perioperative prescription for cardiac device management documentation completed by primary cardiology team and placed on patient's chart for review by the surgical/anesthetic team on the day of his procedure.   Based on clinical review performed today (11/13/21), barring any significant acute changes in the patient's overall condition, it is anticipated that he will be able to proceed with the planned surgical intervention. Any acute changes in clinical condition may necessitate his procedure being postponed and/or cancelled. Patient will meet with anesthesia team (MD and/or CRNA) on the day of his procedure for preoperative evaluation/assessment. Questions regarding anesthetic course will be fielded at that time.    Pre-surgical instructions were reviewed with the patient during his PAT appointment and questions were fielded by PAT clinical staff. Patient was advised that if any questions or concerns arise prior to his procedure then he should return a call to PAT and/or his surgeon's office to discuss.  Honor Loh, MSN, APRN, FNP-C, [facility]  Peri-operative Services Nurse Practitioner Phone: [phone] Fax: [phone] 11/13/21 9:43 AM  NOTE: This note has been prepared using Dragon dictation software. Despite my best ability to proofread, there is always the potential that unintentional transcriptional errors may still occur from this process.

## 2021-11-16 ENCOUNTER — Ambulatory Visit: Payer: Medicare PPO | Admitting: Urgent Care

## 2021-11-16 ENCOUNTER — Encounter
Admission: RE | Disposition: A | Discharge status: Home / Self Care | Payer: Self-pay | Source: Home / Self Care | Attending: Urology

## 2021-11-16 ENCOUNTER — Ambulatory Visit: Payer: Medicare PPO

## 2021-11-16 ENCOUNTER — Ambulatory Visit
Admission: RE | Admit: 2021-11-16 | Discharge: 2021-11-16 | Disposition: A | Discharge status: Home / Self Care | Payer: Medicare PPO | Attending: Urology | Admitting: Urology

## 2021-11-16 ENCOUNTER — Encounter: Payer: Self-pay | Admitting: Urology

## 2021-11-16 DIAGNOSIS — D631 Anemia in chronic kidney disease: Secondary | ICD-10-CM | POA: Insufficient documentation

## 2021-11-16 DIAGNOSIS — Z01812 Encounter for preprocedural laboratory examination: Secondary | ICD-10-CM

## 2021-11-16 DIAGNOSIS — J449 Chronic obstructive pulmonary disease, unspecified: Secondary | ICD-10-CM | POA: Insufficient documentation

## 2021-11-16 DIAGNOSIS — R310 Gross hematuria: Secondary | ICD-10-CM

## 2021-11-16 DIAGNOSIS — N3041 Irradiation cystitis with hematuria: Secondary | ICD-10-CM | POA: Diagnosis not present

## 2021-11-16 DIAGNOSIS — N138 Other obstructive and reflux uropathy: Secondary | ICD-10-CM | POA: Insufficient documentation

## 2021-11-16 DIAGNOSIS — I130 Hypertensive heart and chronic kidney disease with heart failure and stage 1 through stage 4 chronic kidney disease, or unspecified chronic kidney disease: Secondary | ICD-10-CM | POA: Diagnosis not present

## 2021-11-16 DIAGNOSIS — I714 Abdominal aortic aneurysm, without rupture, unspecified: Secondary | ICD-10-CM | POA: Insufficient documentation

## 2021-11-16 DIAGNOSIS — E1151 Type 2 diabetes mellitus with diabetic peripheral angiopathy without gangrene: Secondary | ICD-10-CM | POA: Insufficient documentation

## 2021-11-16 DIAGNOSIS — M069 Rheumatoid arthritis, unspecified: Secondary | ICD-10-CM | POA: Insufficient documentation

## 2021-11-16 DIAGNOSIS — R0602 Shortness of breath: Secondary | ICD-10-CM | POA: Diagnosis not present

## 2021-11-16 DIAGNOSIS — Z79899 Other long term (current) drug therapy: Secondary | ICD-10-CM

## 2021-11-16 DIAGNOSIS — Q6102 Congenital multiple renal cysts: Secondary | ICD-10-CM | POA: Diagnosis not present

## 2021-11-16 DIAGNOSIS — F419 Anxiety disorder, unspecified: Secondary | ICD-10-CM | POA: Insufficient documentation

## 2021-11-16 DIAGNOSIS — G4730 Sleep apnea, unspecified: Secondary | ICD-10-CM | POA: Insufficient documentation

## 2021-11-16 DIAGNOSIS — M1990 Unspecified osteoarthritis, unspecified site: Secondary | ICD-10-CM | POA: Diagnosis not present

## 2021-11-16 DIAGNOSIS — N3289 Other specified disorders of bladder: Secondary | ICD-10-CM | POA: Diagnosis not present

## 2021-11-16 DIAGNOSIS — E1122 Type 2 diabetes mellitus with diabetic chronic kidney disease: Secondary | ICD-10-CM | POA: Diagnosis not present

## 2021-11-16 DIAGNOSIS — N401 Enlarged prostate with lower urinary tract symptoms: Secondary | ICD-10-CM | POA: Insufficient documentation

## 2021-11-16 DIAGNOSIS — I700 Atherosclerosis of aorta: Secondary | ICD-10-CM | POA: Insufficient documentation

## 2021-11-16 DIAGNOSIS — I4891 Unspecified atrial fibrillation: Secondary | ICD-10-CM | POA: Diagnosis not present

## 2021-11-16 DIAGNOSIS — I252 Old myocardial infarction: Secondary | ICD-10-CM | POA: Insufficient documentation

## 2021-11-16 DIAGNOSIS — E785 Hyperlipidemia, unspecified: Secondary | ICD-10-CM | POA: Diagnosis not present

## 2021-11-16 DIAGNOSIS — Z950 Presence of cardiac pacemaker: Secondary | ICD-10-CM | POA: Insufficient documentation

## 2021-11-16 DIAGNOSIS — Z8546 Personal history of malignant neoplasm of prostate: Secondary | ICD-10-CM | POA: Diagnosis not present

## 2021-11-16 DIAGNOSIS — N189 Chronic kidney disease, unspecified: Secondary | ICD-10-CM | POA: Diagnosis not present

## 2021-11-16 DIAGNOSIS — F17210 Nicotine dependence, cigarettes, uncomplicated: Secondary | ICD-10-CM | POA: Diagnosis not present

## 2021-11-16 DIAGNOSIS — I2510 Atherosclerotic heart disease of native coronary artery without angina pectoris: Secondary | ICD-10-CM | POA: Insufficient documentation

## 2021-11-16 DIAGNOSIS — N3081 Other cystitis with hematuria: Secondary | ICD-10-CM | POA: Diagnosis not present

## 2021-11-16 DIAGNOSIS — G43909 Migraine, unspecified, not intractable, without status migrainosus: Secondary | ICD-10-CM | POA: Diagnosis not present

## 2021-11-16 DIAGNOSIS — I509 Heart failure, unspecified: Secondary | ICD-10-CM | POA: Diagnosis not present

## 2021-11-16 DIAGNOSIS — C9000 Multiple myeloma not having achieved remission: Secondary | ICD-10-CM | POA: Diagnosis not present

## 2021-11-16 HISTORY — PX: CYSTOSCOPY W/ RETROGRADES: SHX1426

## 2021-11-16 HISTORY — DX: Calculus of kidney: N20.0

## 2021-11-16 HISTORY — DX: Sick sinus syndrome: I49.5

## 2021-11-16 HISTORY — DX: Male erectile dysfunction, unspecified: N52.9

## 2021-11-16 HISTORY — DX: Long term (current) use of anticoagulants: Z79.01

## 2021-11-16 HISTORY — DX: Peripheral vascular disease, unspecified: I73.9

## 2021-11-16 HISTORY — DX: Fatty (change of) liver, not elsewhere classified: K76.0

## 2021-11-16 HISTORY — DX: Atherosclerosis of aorta: I70.0

## 2021-11-16 HISTORY — DX: Type 2 diabetes mellitus without complications: E11.9

## 2021-11-16 HISTORY — DX: Benign prostatic hyperplasia without lower urinary tract symptoms: N40.0

## 2021-11-16 HISTORY — DX: Obstructive sleep apnea (adult) (pediatric): Z99.89

## 2021-11-16 HISTORY — DX: Chronic kidney disease, stage 3 unspecified: N18.30

## 2021-11-16 HISTORY — DX: Vitamin D deficiency, unspecified: E55.9

## 2021-11-16 HISTORY — DX: Cyst of kidney, acquired: N28.1

## 2021-11-16 HISTORY — DX: Migraine, unspecified, not intractable, without status migrainosus: G43.909

## 2021-11-16 HISTORY — DX: Other specified diseases of liver: K76.89

## 2021-11-16 HISTORY — DX: Abdominal aortic aneurysm, without rupture, unspecified: I71.40

## 2021-11-16 HISTORY — DX: Diverticulosis of intestine, part unspecified, without perforation or abscess without bleeding: K57.90

## 2021-11-16 HISTORY — PX: CYSTOSCOPY WITH BIOPSY: SHX5122

## 2021-11-16 HISTORY — DX: Obstructive sleep apnea (adult) (pediatric): G47.33

## 2021-11-16 LAB — GLUCOSE, CAPILLARY
Glucose-Capillary: 102 mg/dL — ABNORMAL HIGH (ref 70–99)
Glucose-Capillary: 117 mg/dL — ABNORMAL HIGH (ref 70–99)

## 2021-11-16 SURGERY — CYSTOSCOPY, WITH BIOPSY
Anesthesia: General | Site: Bladder

## 2021-11-16 MED ORDER — IOHEXOL 180 MG/ML  SOLN
INTRAMUSCULAR | Status: DC | PRN
  Administered 2021-11-16: 20 mL

## 2021-11-16 MED ORDER — CHLORHEXIDINE GLUCONATE 0.12 % MT SOLN
OROMUCOSAL | Status: AC
  Filled 2021-11-16: qty 15

## 2021-11-16 MED ORDER — DEXAMETHASONE SODIUM PHOSPHATE 10 MG/ML IJ SOLN
INTRAMUSCULAR | Status: DC | PRN
  Administered 2021-11-16: 5 mg via INTRAVENOUS

## 2021-11-16 MED ORDER — ONDANSETRON HCL 4 MG/2ML IJ SOLN
INTRAMUSCULAR | Status: DC | PRN
  Administered 2021-11-16: 4 mg via INTRAVENOUS

## 2021-11-16 MED ORDER — ORAL CARE MOUTH RINSE: 15.0000 mL | Freq: Once | OROMUCOSAL | Status: DC

## 2021-11-16 MED ORDER — FENTANYL CITRATE (PF) 100 MCG/2ML IJ SOLN: 25.0000 ug | INTRAMUSCULAR | Status: DC | PRN

## 2021-11-16 MED ORDER — PHENYLEPHRINE 80 MCG/ML (10ML) SYRINGE FOR IV PUSH (FOR BLOOD PRESSURE SUPPORT)
PREFILLED_SYRINGE | INTRAVENOUS | Status: DC | PRN
  Administered 2021-11-16 (×2): 80 ug via INTRAVENOUS

## 2021-11-16 MED ORDER — SUCCINYLCHOLINE CHLORIDE 200 MG/10ML IV SOSY
PREFILLED_SYRINGE | INTRAVENOUS | Status: DC | PRN
  Administered 2021-11-16: 100 mg via INTRAVENOUS

## 2021-11-16 MED ORDER — ROCURONIUM BROMIDE 100 MG/10ML IV SOLN
INTRAVENOUS | Status: DC | PRN
  Administered 2021-11-16: 10 mg via INTRAVENOUS

## 2021-11-16 MED ORDER — FENTANYL CITRATE (PF) 100 MCG/2ML IJ SOLN
INTRAMUSCULAR | Status: AC
  Filled 2021-11-16: qty 2

## 2021-11-16 MED ORDER — CHLORHEXIDINE GLUCONATE 0.12 % MT SOLN: 15.0000 mL | Freq: Once | OROMUCOSAL | Status: DC

## 2021-11-16 MED ORDER — ACETAMINOPHEN 10 MG/ML IV SOLN
INTRAVENOUS | Status: DC | PRN
  Administered 2021-11-16: 1000 mg via INTRAVENOUS

## 2021-11-16 MED ORDER — GLYCOPYRROLATE 0.2 MG/ML IJ SOLN
INTRAMUSCULAR | Status: DC | PRN
  Administered 2021-11-16: .2 mg via INTRAVENOUS

## 2021-11-16 MED ORDER — ACETAMINOPHEN 10 MG/ML IV SOLN
INTRAVENOUS | Status: AC
  Filled 2021-11-16: qty 200

## 2021-11-16 MED ORDER — STERILE WATER FOR IRRIGATION IR SOLN
Status: DC | PRN
  Administered 2021-11-16: 3000 mL via INTRAVESICAL

## 2021-11-16 MED ORDER — CEFAZOLIN SODIUM-DEXTROSE 2-4 GM/100ML-% IV SOLN
2.0000 g | INTRAVENOUS | Status: AC
  Administered 2021-11-16: 2 g via INTRAVENOUS

## 2021-11-16 MED ORDER — VASOPRESSIN 20 UNIT/ML IV SOLN
INTRAVENOUS | Status: DC | PRN
  Administered 2021-11-16: 2 [IU] via INTRAVENOUS

## 2021-11-16 MED ORDER — PROPOFOL 10 MG/ML IV BOLUS
INTRAVENOUS | Status: DC | PRN
  Administered 2021-11-16: 150 mg via INTRAVENOUS

## 2021-11-16 MED ORDER — ONDANSETRON HCL 4 MG/2ML IJ SOLN: 4.0000 mg | Freq: Once | INTRAMUSCULAR | Status: DC | PRN

## 2021-11-16 MED ORDER — SODIUM CHLORIDE 0.9 % IV SOLN: INTRAVENOUS | Status: DC

## 2021-11-16 MED ORDER — CEFAZOLIN SODIUM-DEXTROSE 2-4 GM/100ML-% IV SOLN
INTRAVENOUS | Status: AC
  Filled 2021-11-16: qty 100

## 2021-11-16 MED ORDER — SUGAMMADEX SODIUM 500 MG/5ML IV SOLN
INTRAVENOUS | Status: DC | PRN
  Administered 2021-11-16: 300 mg via INTRAVENOUS

## 2021-11-16 MED ORDER — LIDOCAINE HCL (CARDIAC) PF 100 MG/5ML IV SOSY
PREFILLED_SYRINGE | INTRAVENOUS | Status: DC | PRN
  Administered 2021-11-16: 100 mg via INTRAVENOUS

## 2021-11-16 SURGICAL SUPPLY — 24 items
BAG DRAIN CYSTO-URO LG1000N (MISCELLANEOUS) ×3 IMPLANT
BRUSH SCRUB EZ  4% CHG (MISCELLANEOUS) ×1
BRUSH SCRUB EZ 1% IODOPHOR (MISCELLANEOUS) ×3 IMPLANT
BRUSH SCRUB EZ 4% CHG (MISCELLANEOUS) ×2 IMPLANT
CATH URETL OPEN 5X70 (CATHETERS) ×3 IMPLANT
DRAPE UTILITY 15X26 TOWEL STRL (DRAPES) ×3 IMPLANT
DRSG TELFA 4X3 1S NADH ST (GAUZE/BANDAGES/DRESSINGS) ×3 IMPLANT
ELECT REM PT RETURN 9FT ADLT (ELECTROSURGICAL) ×3
ELECTRODE REM PT RTRN 9FT ADLT (ELECTROSURGICAL) ×2 IMPLANT
GAUZE 4X4 16PLY ~~LOC~~+RFID DBL (SPONGE) ×6 IMPLANT
GLOVE BIO SURGEON STRL SZ 6.5 (GLOVE) ×3 IMPLANT
GOWN STRL REUS W/ TWL LRG LVL3 (GOWN DISPOSABLE) ×4 IMPLANT
GOWN STRL REUS W/TWL LRG LVL3 (GOWN DISPOSABLE) ×6
GUIDEWIRE STR DUAL SENSOR (WIRE) ×2 IMPLANT
IV NS IRRIG 3000ML ARTHROMATIC (IV SOLUTION) ×3 IMPLANT
KIT TURNOVER CYSTO (KITS) ×3 IMPLANT
NDL SAFETY ECLIPSE 18X1.5 (NEEDLE) ×2 IMPLANT
NEEDLE HYPO 18GX1.5 SHARP (NEEDLE) ×3
PACK CYSTO AR (MISCELLANEOUS) ×3 IMPLANT
SET CYSTO W/LG BORE CLAMP LF (SET/KITS/TRAYS/PACK) ×3 IMPLANT
SURGILUBE 2OZ TUBE FLIPTOP (MISCELLANEOUS) ×3 IMPLANT
WATER STERILE IRR 1000ML POUR (IV SOLUTION) ×3 IMPLANT
WATER STERILE IRR 3000ML UROMA (IV SOLUTION) ×3 IMPLANT
WATER STERILE IRR 500ML POUR (IV SOLUTION) ×3 IMPLANT

## 2021-11-16 NOTE — Anesthesia Postprocedure Evaluation (Signed)
Anesthesia Post Note  Patient: Omar [name]  Procedure(s) Performed: CYSTOSCOPY WITH BLADDER BIOPSY (Bladder) CYSTOSCOPY WITH RETROGRADE PYELOGRAM (Bilateral: Bladder)  Patient location during evaluation: PACU Anesthesia Type: General Level of consciousness: awake and awake and alert Pain management: pain level controlled Vital Signs Assessment: post-procedure vital signs reviewed and stable Respiratory status: spontaneous breathing and respiratory function stable Cardiovascular status: stable Anesthetic complications: no   No notable events documented.   Last Vitals:  Vitals:   11/16/21 1130 11/16/21 1145  BP: 123/68 (!) 83/56  Pulse: 63 62  Resp: 20 18  Temp: 36.6 C 36.4 C  SpO2: 98% 95%    Last Pain:  Vitals:   11/16/21 1145  TempSrc:   PainSc: 0-No pain                 VAN STAVEREN,Derika Eckles

## 2021-11-16 NOTE — Transfer of Care (Signed)
Immediate Anesthesia Transfer of Care Note  Patient: Omar [name]  Procedure(s) Performed: CYSTOSCOPY WITH BLADDER BIOPSY (Bladder) CYSTOSCOPY WITH RETROGRADE PYELOGRAM (Bilateral: Bladder)  Patient Location: PACU  Anesthesia Type:General  Level of Consciousness: awake, drowsy and patient cooperative  Airway & Oxygen Therapy: Patient Spontanous Breathing and Patient connected to face mask oxygen  Post-op Assessment: Report given to RN and Post -op Vital signs reviewed and stable  Post vital signs: Reviewed and stable  Last Vitals:  Vitals Value Taken Time  BP 123/68 11/16/21 1130  Temp 36.6 C 11/16/21 1130  Pulse 61 11/16/21 1133  Resp 19 11/16/21 1133  SpO2 92 % 11/16/21 1133  Vitals shown include unvalidated device data.  Last Pain:  Vitals:   11/16/21 1130  TempSrc:   PainSc: 0-No pain      Patients Stated Pain Goal: 0 (69/79/48 0165)  Complications: No notable events documented.

## 2021-11-16 NOTE — Anesthesia Preprocedure Evaluation (Signed)
Anesthesia Evaluation  Patient identified by MRN, date of birth, ID band Patient awake    Reviewed: Allergy & Precautions, NPO status , Patient's Chart, lab work & pertinent test results  Airway Mallampati: II  TM Distance: >3 FB Neck ROM: full    Dental  (+) Missing, Dental Advisory Given   Pulmonary neg pulmonary ROS, shortness of breath and with exertion, sleep apnea , COPD,  COPD inhaler, Current Smoker,    Pulmonary exam normal  + decreased breath sounds      Cardiovascular Exercise Tolerance: Poor hypertension, Pt. on medications + CAD, + Past MI and +CHF  negative cardio ROS Normal cardiovascular exam+ dysrhythmias Atrial Fibrillation + pacemaker  Rhythm:Regular     Neuro/Psych  Headaches, Anxiety negative neurological ROS  negative psych ROS   GI/Hepatic negative GI ROS, Neg liver ROS,   Endo/Other  negative endocrine ROSdiabetes  Renal/GU      Musculoskeletal  (+) Arthritis ,   Abdominal Normal abdominal exam  (+)   Peds  Hematology negative hematology ROS (+) Blood dyscrasia, anemia ,   Anesthesia Other Findings Past Medical History: No date: AAA (abdominal aortic aneurysm) (HCC)     Comment:  a.) s/p EVAR 11/05/2013. No date: Anxiety     Comment:  a.) on BZO (alprazolam) PRN No date: Aortic atherosclerosis (HCC) No date: Atrial fibrillation (HCC)     Comment:  a.) CHA2DS2-VASc = 6 (age x2, CHF, HTN, PVD/aortic               plaque, T2DM). b.) rate/rhythm maintained without use of               pharmacological intervention; chronically anticoagulated               using dose reduced apixaban. No date: BPH (benign prostatic hyperplasia) No date: CHF (congestive heart failure) ([place])     Comment:  a.)  TTE 03/16/2021: EF 63%; moderate LVH; mild BAE mild              to moderate pan valvular regurgitation; G1DD. No date: CKD (chronic kidney disease), stage III ([place]) 07/07/2015: Colon polyp      Comment:  TUBULAR ADENOMA WITH AT LEAST HIGH-GRADE / Dr Rayann Heman No date: COPD (chronic obstructive pulmonary disease) ([place]) No date: Coronary artery disease     Comment:  a.) coronary CTA 01/10/2015 CA score 722.3; mod-sev LAD               disease; mild LCx disease; 50-60% RCA disease. No date: Current use of long term anticoagulation     Comment:  a.) dose reduced apixaban No date: Diverticulosis No date: Erectile dysfunction No date: GERD (gastroesophageal reflux disease) No date: Gout No date: Heart murmur No date: Hepatic cyst No date: Hepatic steatosis No date: Hepatitis No date: Hypercholesterolemia No date: Hypertension No date: Iron deficiency anemia No date: Migraines No date: Multiple myeloma (HCC) No date: Nephrolithiasis No date: OSA on CPAP No date: Presence of permanent cardiac pacemaker 01/01/13, 01/30/14: Prostate cancer ([place])     Comment:  Gleason 3+4=7, volume 46.6 cc No date: PVD (peripheral vascular disease) ([place]) No date: Renal cyst No date: Rheumatoid arthritis ([place])  04/03/2014 through 06/04/2014                                                    :  S/P radiation therapy     Comment:  Prostate 7800 cGy in 40 sessions                         No date: SSS (sick sinus syndrome) (HCC)     Comment:  a.) s/p Medtronic Azure PPM placement 08/26/2016 No date: T2DM (type 2 diabetes mellitus) ([place]) 08/26/2016: Third degree heart block ([place])     Comment:  a.) s/p PPM placement (Medtronic Azure XT DR MRI [id number]               S/N: [id number]) on 08/26/2016 No date: Vitamin D deficiency  Past Surgical History: 11/05/2013: ABDOMINAL AORTIC ANEURYSM REPAIR 08/06/2015: COLON SURGERY     Comment:  Right hemicolectomy for tubulovillous adenoma with               high-grade dysplasia. 07/07/2015: COLONOSCOPY WITH PROPOFOL; N/A     Comment:  Procedure: COLONOSCOPY WITH PROPOFOL;  Surgeon: Josefine Class, MD;  Location: [facility] ENDOSCOPY;  Service:                Endoscopy;  Laterality: N/A; 10/28/2016: COLONOSCOPY WITH PROPOFOL; N/A     Comment:  Procedure: COLONOSCOPY WITH PROPOFOL;  Surgeon: [name], MD;  Location: [facility] ENDOSCOPY;  Service:               Endoscopy;  Laterality: N/A; 10/28/2016: ESOPHAGOGASTRODUODENOSCOPY (EGD) WITH PROPOFOL; N/A     Comment:  Procedure: ESOPHAGOGASTRODUODENOSCOPY (EGD) WITH               PROPOFOL;  Surgeon: [name], MD;  Location: [facility]               ENDOSCOPY;  Service: Endoscopy;  Laterality: N/A; 07/25/2017: GIVENS CAPSULE STUDY; N/A     Comment:  Procedure: GIVENS CAPSULE STUDY;  Surgeon: [name],               MD;  Location: [facility] ENDOSCOPY;  Service:               Gastroenterology;  Laterality: N/A; 01/29/2019: GIVENS CAPSULE STUDY; N/A     Comment:  Procedure: GIVENS CAPSULE STUDY;  Surgeon: [name],               MD;  Location: [facility] ENDOSCOPY;  Service:               Gastroenterology;  Laterality: N/A; 08/08/2015: LAPAROSCOPIC RIGHT COLECTOMY; Right     Comment:  Procedure: LAPAROSCOPIC RIGHT COLECTOMY;  Surgeon:               Robert Bellow, MD;  Location: [facility] ORS;  Service:               General;  Laterality: Right; No date: NASAL SINUS SURGERY 08/26/2016: PACEMAKER INSERTION; Left     Comment:  Procedure: INSERTION PACEMAKER;  Surgeon: Isaias Cowman, MD;  Location: [facility] ORS;  Service:               Cardiovascular;  Laterality: Left; 01/01/13, 01/30/14: PROSTATE BIOPSY     Comment:  Gleason 3+3=6, vol 46.6 cc No date: TONSILLECTOMY No date: uvula surgery     Comment:  for sleep apnea  BMI  Body Mass Index: 30.40 kg/m      Reproductive/Obstetrics negative OB ROS                             Anesthesia Physical Anesthesia Plan  ASA: 3  Anesthesia Plan: General   Post-op Pain Management:    Induction: Intravenous  PONV Risk Score and Plan: Ondansetron, Dexamethasone, Midazolam and Treatment may vary due  to age or medical condition  Airway Management Planned: Oral ETT  Additional Equipment:   Intra-op Plan:   Post-operative Plan: Extubation in OR  Informed Consent: I have reviewed the patients History and Physical, chart, labs and discussed the procedure including the risks, benefits and alternatives for the proposed anesthesia with the patient or authorized representative who has indicated his/her understanding and acceptance.     Dental Advisory Given  Plan Discussed with: CRNA and Surgeon  Anesthesia Plan Comments:         Anesthesia Quick Evaluation

## 2021-11-16 NOTE — Interval H&P Note (Signed)
History and Physical Interval Note:  11/16/2021 10:49 AM  Omar [name]  has presented today for surgery, with the diagnosis of Gross Hematuria.  The various methods of treatment have been discussed with the patient and family. After consideration of risks, benefits and other options for treatment, the patient has consented to  Procedure(s): CYSTOSCOPY WITH BLADDER BIOPSY (N/A) CYSTOSCOPY WITH RETROGRADE PYELOGRAM (Bilateral) as a surgical intervention.  The patient's history has been reviewed, patient examined, no change in status, stable for surgery.  I have reviewed the patient's chart and labs.  Questions were answered to the patient's satisfaction.    RRR CTAB   Hollice Espy

## 2021-11-16 NOTE — Anesthesia Procedure Notes (Signed)
Procedure Name: Intubation Date/Time: 11/16/2021 11:04 AM  Performed by: Kelton Pillar, CRNAPre-anesthesia Checklist: Patient identified, Emergency Drugs available, Suction available and Patient being monitored Patient Re-evaluated:Patient Re-evaluated prior to induction Oxygen Delivery Method: Circle system utilized Preoxygenation: Pre-oxygenation with 100% oxygen Induction Type: IV induction Ventilation: Mask ventilation without difficulty Laryngoscope Size: McGraph and 3 Grade View: Grade I Tube type: Oral Tube size: 7.0 mm Number of attempts: 1 Airway Equipment and Method: Stylet and Oral airway Placement Confirmation: ETT inserted through vocal cords under direct vision, positive ETCO2, breath sounds checked- equal and bilateral and CO2 detector Secured at: 21 cm Tube secured with: Tape Dental Injury: Teeth and Oropharynx as per pre-operative assessment

## 2021-11-16 NOTE — Op Note (Signed)
Date of procedure: 11/16/21  Preoperative diagnosis:  Gross hematuria Bladder lesion (bladder neck)  Postoperative diagnosis:  Same as above  Procedure: Cystoscopy Bilateral retrograde pyelogram Bladder biopsy  Surgeon: Hollice Espy, MD  Anesthesia: General  Complications: None  Intraoperative findings: Hypervascular erythematous changes of the bladder neck with what almost appeared to be varicosity particularly at the left bladder neck.  Findings are most consistent with radiation changes of the malignancy cannot be ruled out.  Bilateral retrograde pyelogram unremarkable.  EBL: Minimal  Specimens: Bladder neck biopsy  Drains: None  Indication: Omar [name] is a 81 y.o. patient with gross hematuria found to have changes around the bladder neck most consistent with radiation cystitis although malignancy cannot be ruled out..  After reviewing the management options for treatment, he elected to proceed with the above surgical procedure(s). We have discussed the potential benefits and risks of the procedure, side effects of the proposed treatment, the likelihood of the patient achieving the goals of the procedure, and any potential problems that might occur during the procedure or recuperation. Informed consent has been obtained.  Description of procedure:  The patient was taken to the operating room and general anesthesia was induced.  The patient was placed in the dorsal lithotomy position, prepped and draped in the usual sterile fashion, and preoperative antibiotics were administered. A preoperative time-out was performed.   A 21 French cystoscope was advanced per urethra into the bladder.  The bladder was carefully inspected.  There were hypervascularity and erythema along the bladder neck, left greater than right with some large blood vessels.  These visual changes were most consistent with radiation related changes however did feel the need to biopsy this to rule out  underlying malignancy.  There was no obvious papillary tumor appreciated.  First, I elected to perform bilateral retrograde pyelogram.  The trigone was somewhat difficult especially in the left due to elevated bladder neck.  Ended up using a wire to access the left UO and eventually was able to intubate over the wire using 5 [place] open-ended ureteral catheter just within the UO itself.  Gentle retrograde pyelogram on this side showed some distal J hooking of the ureter with no hydroureteronephrosis or filling defects on this side.  On the right side, had a much easier time inserting the 5 [place] open-ended ureteral catheter just inside the UO.  Retrograde pyelogram on the stent was also unremarkable without hydronephrosis or filling defects.  Next, cold cup biopsy forceps were used to take 3 representative samples of the bladder neck.  The remainder of the bladder neck was fulgurated using Bugbee electrocautery.  Excellent hemostasis was achieved.  There were no other tumors that were concerning or necessitated biopsy.  The bladder was drained and the scope was then removed.  The patient was cleaned and dried, repositioned in supine position, reversed from anesthesia, and taken to the PACU in stable condition.  Plan: I will call him with the pathology results.  Hollice Espy, M.D.

## 2021-11-16 NOTE — Progress Notes (Signed)
Patient blood pressure is 90/50 checked manually with appropriate cuff size. Notified Dr. [place] Service and no new orders given, says if patient is alert and oriented he is okay.

## 2021-11-16 NOTE — Discharge Instructions (Addendum)

## 2021-11-17 ENCOUNTER — Encounter: Payer: Self-pay | Admitting: Urology

## 2021-11-17 LAB — SURGICAL PATHOLOGY

## 2021-11-18 ENCOUNTER — Telehealth: Payer: Self-pay | Admitting: *Deleted

## 2021-11-18 NOTE — Telephone Encounter (Addendum)
Left patient VM with details asked to return call  ----- Message from Hollice Espy, MD sent at 11/18/2021  8:55 AM EDT ----- Bladder biopsies were benign, no evidence of malignancy which is amazing news!  Hollice Espy, MD

## 2021-12-07 ENCOUNTER — Inpatient Hospital Stay: Payer: Medicare PPO | Attending: Oncology

## 2021-12-07 ENCOUNTER — Inpatient Hospital Stay (HOSPITAL_BASED_OUTPATIENT_CLINIC_OR_DEPARTMENT_OTHER): Payer: Medicare PPO | Admitting: Oncology

## 2021-12-07 ENCOUNTER — Encounter: Payer: Self-pay | Admitting: Oncology

## 2021-12-07 ENCOUNTER — Inpatient Hospital Stay: Payer: Medicare PPO

## 2021-12-07 VITALS — BP 108/69 | HR 86 | Temp 97.9°F | Resp 18 | Wt 207.4 lb

## 2021-12-07 DIAGNOSIS — D472 Monoclonal gammopathy: Secondary | ICD-10-CM | POA: Diagnosis not present

## 2021-12-07 DIAGNOSIS — D751 Secondary polycythemia: Secondary | ICD-10-CM

## 2021-12-07 DIAGNOSIS — C61 Malignant neoplasm of prostate: Secondary | ICD-10-CM

## 2021-12-07 DIAGNOSIS — Z8546 Personal history of malignant neoplasm of prostate: Secondary | ICD-10-CM

## 2021-12-07 DIAGNOSIS — N183 Chronic kidney disease, stage 3 unspecified: Secondary | ICD-10-CM | POA: Insufficient documentation

## 2021-12-07 DIAGNOSIS — D509 Iron deficiency anemia, unspecified: Secondary | ICD-10-CM | POA: Diagnosis present

## 2021-12-07 DIAGNOSIS — D500 Iron deficiency anemia secondary to blood loss (chronic): Secondary | ICD-10-CM | POA: Diagnosis not present

## 2021-12-07 DIAGNOSIS — C9000 Multiple myeloma not having achieved remission: Secondary | ICD-10-CM | POA: Diagnosis not present

## 2021-12-07 DIAGNOSIS — Q2733 Arteriovenous malformation of digestive system vessel: Secondary | ICD-10-CM | POA: Diagnosis not present

## 2021-12-07 DIAGNOSIS — F17200 Nicotine dependence, unspecified, uncomplicated: Secondary | ICD-10-CM

## 2021-12-07 LAB — COMPREHENSIVE METABOLIC PANEL
ALT: 19 U/L (ref 0–44)
AST: 17 U/L (ref 15–41)
Albumin: 3.5 g/dL (ref 3.5–5.0)
Alkaline Phosphatase: 74 U/L (ref 38–126)
Anion gap: 6 (ref 5–15)
BUN: 18 mg/dL (ref 8–23)
CO2: 23 mmol/L (ref 22–32)
Calcium: 8.6 mg/dL — ABNORMAL LOW (ref 8.9–10.3)
Chloride: 105 mmol/L (ref 98–111)
Creatinine, Ser: 1.45 mg/dL — ABNORMAL HIGH (ref 0.61–1.24)
GFR, Estimated: 49 mL/min — ABNORMAL LOW (ref >60)
Glucose, Bld: 88 mg/dL (ref 70–99)
Potassium: 4.1 mmol/L (ref 3.5–5.1)
Sodium: 134 mmol/L — ABNORMAL LOW (ref 135–145)
Total Bilirubin: 0.6 mg/dL (ref 0.3–1.2)
Total Protein: 7.9 g/dL (ref 6.5–8.1)

## 2021-12-07 LAB — CBC WITH DIFFERENTIAL/PLATELET
Abs Immature Granulocytes: 0.04 10*3/uL (ref 0.00–0.07)
Basophils Absolute: 0 10*3/uL (ref 0.0–0.1)
Basophils Relative: 0 %
Eosinophils Absolute: 0.2 10*3/uL (ref 0.0–0.5)
Eosinophils Relative: 3 %
HCT: 52.6 % — ABNORMAL HIGH (ref 39.0–52.0)
Hemoglobin: 17.2 g/dL — ABNORMAL HIGH (ref 13.0–17.0)
Immature Granulocytes: 1 %
Lymphocytes Relative: 12 %
Lymphs Abs: 1 10*3/uL (ref 0.7–4.0)
MCH: 28.5 pg (ref 26.0–34.0)
MCHC: 32.7 g/dL (ref 30.0–36.0)
MCV: 87.2 fL (ref 80.0–100.0)
Monocytes Absolute: 0.7 10*3/uL (ref 0.1–1.0)
Monocytes Relative: 9 %
Neutro Abs: 6.5 10*3/uL (ref 1.7–7.7)
Neutrophils Relative %: 75 %
Platelets: 232 10*3/uL (ref 150–400)
RBC: 6.03 MIL/uL — ABNORMAL HIGH (ref 4.22–5.81)
RDW: 18.6 % — ABNORMAL HIGH (ref 11.5–15.5)
WBC: 8.5 10*3/uL (ref 4.0–10.5)
nRBC: 0 % (ref 0.0–0.2)

## 2021-12-07 NOTE — Progress Notes (Signed)
No phlebotomy required today per Dr Tasia Catchings. Pt discharged to home.

## 2021-12-07 NOTE — Assessment & Plan Note (Signed)
Continue monitor PSA  

## 2021-12-07 NOTE — Assessment & Plan Note (Signed)
Smoldering kappa light chain multiple myeloma/active myeloma if previous hypermetabolic lesion on PET scan was related to myeloma.Patient was treated with 4 cycles of RVD.  He is not currently on any maintenance therapy due to multiple comorbidities, borderline smoldering multiple myeloma status.   24-hour urine showed Bence-Jones protein. Multiple myeloma panel has been monitored and is stable. His kidney function is stable, no anemia.  No hypercalcemia. Consider repeat bone marrow biopsy if he starts to show signs of organ damage

## 2021-12-07 NOTE — Assessment & Plan Note (Signed)
Recommend smoke cessation.  

## 2021-12-07 NOTE — Progress Notes (Signed)
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  He is not currently on any maintenance therapy due to multiple comorbidities, borderline smoldering multiple myeloma status.   24-hour urine showed Bence-Jones protein. Multiple myeloma panel has been monitored and is stable. His kidney function is stable, no anemia.  No hypercalcemia. Consider repeat bone marrow biopsy if he starts to show signs of organ damage  Malignant neoplasm of prostate ([place]) Continue monitor PSA   Iron deficiency anemia due to chronic blood loss secondary to chronic blood loss due to multiple AVM in small bowels. Status post enteroscopy at [facility] for AVM ablation. No signs of iron deficiency currently.  Tobacco use disorder Recommend smoke cessation.  Orders Placed This Encounter  Procedures   CBC with Differential/Platelet    Standing Status:   Future    Standing Expiration Date:   12/08/2022   Comprehensive metabolic panel    Standing Status:   Future    Standing Expiration Date:   12/07/2022   PSA    Standing Status:   Future    Standing Expiration Date:   12/08/2022   Multiple Myeloma Panel (SPEP&IFE w/QIG)    Standing Status:   Future    Standing Expiration Date:   12/07/2022   Kappa/lambda light chains    Standing Status:   Future     Standing Expiration Date:   12/07/2022   Follow up in 4 months.  All questions were answered. The patient knows to call the clinic with any problems, questions or concerns.  Earlie Server, MD, PhD [facility] Hematology Oncology 12/07/2021     HISTORY OF PRESENTING ILLNESS:  81 y.o.  male with PMH listed below who presents to follow up on the evaluation and management of his abnormal urine protein electrophoresis results. I reviewed the records from [facility], [place] and [place] was performed and HemOnc related medical problems are listed below.  He lives with a friend. He has 3 adult children.   1 Chronic Kidney disease, Stage III: patient follows up with Dr.Lateef.  2 Proteinuria:  02/18/2017: Albumin/Creatinine ratio was 7, Urine protein electrophoresis,Random Urine revealed M Spike 43.2%, total protein of 43.90m/dl,  01/05/2017 Albumin/Creatinine ratio was 455.2, Albumin 996.5, , 2.15.2018 Albumin/Creatinine ratio was 80.3, Albumin 73.3, Urine protein electrophoresis,Random Urine revealed M Spike 29.7%, total protein of 29.146mdl, 06/30/2016 Serum protein electrophoresis did not detect M spike.  Autoimmune disorder work up showed Negative anti dsDNA, RNP antibodies, smith antibody, Sjogren antibodies, anti Jo-1, positive antichromotin antibodies, positive ANA,  3 Anemia of chronic kidney disease:  4 Iron deficiency anemia: history of iron deficiency, ferritin was 7 on 08/11/2016. He had EGD and colonoscopy that were done this year which showed duodenitis/esphagitis/diverticulosis/benigh polyps. # small bowel AVMs was [facility] at [facility]  5 Rheumatoid arthritis: he follows up with Dr.Kernodle and is on chronic MTX. He Is currently off MTX reports joint pains are controlled, not quite symptomatic.  Patient denies any persistent bone pain or any pain. He continue to feel lack of energy, persistent, not improved with resting or taking naps. He also has  lost 25 pounds in the past year which was unintentional.   # Rheumatoid arthritis and currently is off MTX.  # It is ambiguous whether he has truly symptomatic multiple myeloma or a smoldering myeloma, given that anemia can be secondary to iron deficiency and CKD.  The hypermetabolic 2.1 cm hypermetabolic soft tissue lesion in the left heel, can reflect a Plasmacytoma,which should have been biopsied. However patient initially denies being on any blood thinner, later he was found to be on Plavix with pre biopsy questionnaire screening, and biopsy was held. Patient has to contact cardiologist to hold plavix for seven days before procedure. He feels frustrated about multiple workup and meanwhile he becomes more fatigues, with worsening of kidney function. Patient was reluctant about proceeding additional invasive procedures and wants to start treatment. Decision was made to start active MM treatment.   # He was evaluated by [facility] bone marrow transplant team for autologous bone marrow transplant. He is not considered to  good candidate for transplant. Patient cardiologist Dr. Chancy Milroy had started patient on Lasix 20 mg daily. Lasix was discontinued by [facility] bone marrow transplant team as patient was having dizziness and borderline blood pressure during his clinic visit there.  10/02/2018 He had follow-up left foot/ankle x-ray as a follow-up of his left heel lesion which was initially presented on PET scan and not detectable on follow-up PET scan.  Images were reviewed by me.  No suspicious acute or subacute osseous abnormality  Current Treatment:  S/p RVD x 4 cycles tolerates well except cytopenia.  Revlimid 10 mg PO once per day on days 1 to 14 (dose reduced due to GFR) Revlimid renal dosing of 30m,  Velcade 1.3 mg/m2 IV once per day on days 1,  8, 15 Dexamethasone 20 mg PO once per day on days 1, 8, 15 given patient's diabetes Given patient's multiple comorbidity and the side effects of cytopenia during his  RVD treatment and the uncertainty of this is really a site of plasmacytoma or not, I recommend not to continue him on maintenance Revlimid.   # repeat small bowel capsule study on 02/16/2019 Which showed multiple AVMs seen in the mid jejunum proximal to the site of prior tattoo. # [facility]2019 double-balloon enteroscopy with findings of 5 AVMs in the jejunum and 2 AVMs in the duodenum and AVMs were ablated with APC.  Tattoo was seen in the area. If further bleeding, recommend to consider treatment with octreotide. Patient takes Protonix daily.    INTERVAL HISTORY Patient presents for follow-up of management of multiple myeloma/smoldering multiple myeloma and iron deficiency, secondary erythrocytosis. Patient continues to smoke about a pack of cigarettes daily. Patient has sleep apnea, he uses CPAP machine as much as he can, approximately 2-3 times per week. He has no new complaints.    Review of Systems  Constitutional:  Positive for fatigue. Negative for appetite change, chills, fever and unexpected weight change.  HENT:   Negative for hearing loss and voice change.   Eyes:  Negative for eye problems and icterus.  Respiratory:  Negative for chest tightness, cough and shortness of breath.   Cardiovascular:  Negative for chest pain and leg swelling.  Gastrointestinal:  Negative for abdominal distention, abdominal pain and diarrhea.  Endocrine: Negative for hot flashes.  Genitourinary:  Negative for difficulty urinating, dysuria and frequency.   Musculoskeletal:  Positive for arthralgias.  Skin:  Negative for itching and rash.  Neurological:  Negative for light-headedness and numbness.  Hematological:  Negative for adenopathy. Does not bruise/bleed easily.  Psychiatric/Behavioral:  Negative for confusion.      MEDICAL HISTORY:  Past Medical History:  Diagnosis Date   AAA (abdominal aortic aneurysm) ([place])    a.) s/p EVAR 11/05/2013.    Anxiety    a.) on BZO (alprazolam) PRN   Aortic atherosclerosis (HCC)    Atrial fibrillation (HCC)    a.) CHA2DS2-VASc = 6 (age x2, CHF, HTN, PVD/aortic plaque, T2DM). b.) rate/rhythm maintained without use of pharmacological intervention; chronically anticoagulated using dose reduced apixaban.   BPH (benign prostatic hyperplasia)    CHF (congestive heart failure) ([place])    a.)  TTE 03/16/2021: EF 63%; moderate LVH; mild BAE mild to moderate pan valvular regurgitation; G1DD.   CKD (chronic kidney disease), stage III (HCC)    Colon polyp 07/07/2015   TUBULAR ADENOMA WITH AT LEAST HIGH-GRADE / Dr Rayann Heman   COPD (chronic obstructive pulmonary disease) ([place])    Coronary artery disease    a.) coronary CTA 01/10/2015 CA score 722.3; mod-sev LAD disease; mild LCx disease; 50-60% RCA disease.   Current use of long term anticoagulation    a.) dose reduced apixaban   Diverticulosis    Erectile dysfunction    GERD (gastroesophageal reflux disease)    Gout    Heart murmur    Hepatic cyst    Hepatic steatosis    Hepatitis    Hypercholesterolemia    Hypertension    Iron deficiency anemia    Migraines    Multiple myeloma (HCC)    Nephrolithiasis    OSA on CPAP    Presence of permanent cardiac pacemaker    Prostate cancer ([place]) 01/01/13, 01/30/14   Gleason 3+4=7, volume 46.6 cc   PVD (peripheral vascular disease) ([place])    Renal cyst    Rheumatoid arthritis ([place])    S/P radiation therapy  04/03/2014 through 06/04/2014                                                      Prostate 7800 cGy in 40 sessions                           SSS (sick sinus syndrome) ([place])    a.) s/p Medtronic Azure PPM placement 08/26/2016   T2DM (type 2 diabetes mellitus) ([place])    Third degree heart block ([place]) 08/26/2016   a.) s/p PPM placement (Medtronic Azure XT DR MRI [id number] S/N: [id number]) on 08/26/2016   Vitamin D deficiency     SURGICAL HISTORY: Past Surgical History:  Procedure Laterality Date   ABDOMINAL  AORTIC ANEURYSM REPAIR  11/05/2013   COLON SURGERY  08/06/2015   Right hemicolectomy for tubulovillous adenoma with high-grade dysplasia.   COLONOSCOPY WITH PROPOFOL N/A 07/07/2015   Procedure: COLONOSCOPY WITH PROPOFOL;  Surgeon: Josefine Class, MD;  Location: [facility] ENDOSCOPY;  Service: Endoscopy;  Laterality: N/A;   COLONOSCOPY WITH PROPOFOL N/A 10/28/2016   Procedure: COLONOSCOPY WITH PROPOFOL;  Surgeon: [name], MD;  Location: [facility] ENDOSCOPY;  Service: Endoscopy;  Laterality: N/A;   CYSTOSCOPY W/ RETROGRADES Bilateral 11/16/2021   Procedure: CYSTOSCOPY  WITH RETROGRADE PYELOGRAM;  Surgeon: Hollice Espy, MD;  Location: [facility] ORS;  Service: Urology;  Laterality: Bilateral;   CYSTOSCOPY WITH BIOPSY N/A 11/16/2021   Procedure: CYSTOSCOPY WITH BLADDER BIOPSY;  Surgeon: Hollice Espy, MD;  Location: [facility] ORS;  Service: Urology;  Laterality: N/A;   ESOPHAGOGASTRODUODENOSCOPY (EGD) WITH PROPOFOL N/A 10/28/2016   Procedure: ESOPHAGOGASTRODUODENOSCOPY (EGD) WITH PROPOFOL;  Surgeon: [name], MD;  Location: [facility] ENDOSCOPY;  Service: Endoscopy;  Laterality: N/A;   GIVENS CAPSULE STUDY N/A 07/25/2017   Procedure: GIVENS CAPSULE STUDY;  Surgeon: [name], MD;  Location: [facility] ENDOSCOPY;  Service: Gastroenterology;  Laterality: N/A;   GIVENS CAPSULE STUDY N/A 01/29/2019   Procedure: GIVENS CAPSULE STUDY;  Surgeon: [name], MD;  Location: [facility] ENDOSCOPY;  Service: Gastroenterology;  Laterality: N/A;   LAPAROSCOPIC RIGHT COLECTOMY Right 08/08/2015   Procedure: LAPAROSCOPIC RIGHT COLECTOMY;  Surgeon: Robert Bellow, MD;  Location: [facility] ORS;  Service: General;  Laterality: Right;   NASAL SINUS SURGERY     PACEMAKER INSERTION Left 08/26/2016   Procedure: INSERTION PACEMAKER;  Surgeon: Isaias Cowman, MD;  Location: [facility] ORS;  Service: Cardiovascular;  Laterality: Left;   PROSTATE BIOPSY  01/01/13, 01/30/14   Gleason 3+3=6, vol 46.6 cc   TONSILLECTOMY     uvula surgery     for sleep  apnea    SOCIAL HISTORY: Social History   Socioeconomic History   Marital status: Divorced    Spouse name: Not on file   Number of children: Not on file   Years of education: Not on file   Highest education level: Not on file  Occupational History   Not on file  Tobacco Use   Smoking status: Every Day    Packs/day: 1.00    Years: 60.00    Total pack years: 60.00    Types: Cigarettes   Smokeless tobacco: Never   Tobacco comments:    DOWN TO 1/2 PPD  Vaping Use   Vaping Use: Never used  Substance and Sexual Activity   Alcohol use: Yes    Alcohol/week: 1.0 standard drink of alcohol    Types: 1 Cans of beer per week    Comment: occasionally   Drug use: No   Sexual activity: Not Currently  Other Topics Concern   Not on file  Social History Narrative   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  MEDICATIONS:  Current Outpatient Medications  Medication Sig Dispense Refill   albuterol (PROVENTIL HFA;VENTOLIN HFA) 108 (90 Base) MCG/ACT inhaler Inhale 1 puff into the lungs every 6 (six) hours as needed.     allopurinol (ZYLOPRIM) 100 MG tablet Take 100 mg by mouth daily.     ALPRAZolam (XANAX) 0.25 MG tablet Take by mouth 2 (two) times daily as needed.     apixaban (ELIQUIS)  2.5 MG TABS tablet Take 2.5 mg by mouth 2 (two) times daily.      atorvastatin (LIPITOR) 80 MG tablet Take 80 mg by mouth at bedtime.      benazepril (LOTENSIN) 10 MG tablet Take 10 mg by mouth daily.     Cholecalciferol 1.25 MG (50000 UT) capsule Take 50,000 Units by mouth once a week. wednesday     ezetimibe (ZETIA) 10 MG tablet Take 10 mg by mouth daily.     fluticasone (FLONASE) 50 MCG/ACT nasal spray Place 1 spray into both nostrils daily as needed for rhinitis.     fluticasone furoate-vilanterol (BREO ELLIPTA) 100-25 MCG/INH AEPB Inhale 1 puff into the lungs daily.      gabapentin (NEURONTIN) 400 MG capsule Take 400 mg by mouth 3 (three) times daily.     HUMIRA PEN 40 MG/0.4ML PNKT Inject 40 mg into the skin every 14 (fourteen) days.     isosorbide mononitrate (IMDUR) 60 MG 24 hr tablet Take 60 mg by mouth daily.     JARDIANCE 25 MG TABS tablet 25 mg daily.     pantoprazole (PROTONIX) 20 MG tablet Take 20 mg by mouth daily.     tamsulosin (FLOMAX) 0.4 MG CAPS capsule TAKE 1 CAPSULE BY MOUTH ONCE DAILY AFTER  SUPPER (Patient taking differently: 0.4 mg daily.) 90 capsule 3   traMADol (ULTRAM) 50 MG tablet Take 50 mg by mouth 2 (two) times daily as needed.     Butalbital-APAP-Caffeine 50-300-40 MG CAPS Take 1 capsule by mouth every 4 (four) hours as needed. (Patient not taking: Reported on 12/07/2021)     No current facility-administered medications for this visit.      Marland Kitchen  PHYSICAL EXAMINATION: ECOG PERFORMANCE STATUS: 1 - Symptomatic but completely ambulatory Vitals:   12/07/21 1326  BP: 108/69  Pulse: 86  Resp: 18  Temp: 97.9 F (36.6 C)   Filed Weights   12/07/21 1326  Weight: 207 lb 6.4 oz (94.1 kg)   Physical Exam Constitutional:      General: He is not in acute distress.    Appearance: He is not diaphoretic.  HENT:     Head: Normocephalic and atraumatic.     Nose: Nose normal.     Mouth/Throat:     Pharynx: No oropharyngeal exudate.  Eyes:     General: No scleral icterus.       Right eye: No discharge.        Left eye: No discharge.     Pupils: Pupils are equal, round, and reactive to light.   Neck:     Vascular: No JVD.  Cardiovascular:     Rate and Rhythm: Normal rate and regular rhythm.     Heart sounds: Normal heart sounds. No murmur heard.    No friction rub.  Pulmonary:     Effort: Pulmonary effort is normal. No respiratory distress.     Breath sounds: Normal breath sounds. No wheezing or rales.  Chest:     Chest wall: No tenderness.  Abdominal:     General: Bowel sounds are normal. There is no distension.     Palpations: Abdomen is soft. There is no mass.     Tenderness: There is no abdominal tenderness.  Musculoskeletal:        General: No tenderness. Normal range of motion.     Cervical back: Normal range of motion and neck supple.  Lymphadenopathy:     Cervical: No cervical adenopathy.  Skin:    General: Skin  is warm and dry.     Findings: No erythema.     Comments: Hypopigmentation of skin  Neurological:     Mental Status: He is alert and oriented to person, place, and time.  Psychiatric:        Mood and Affect: Affect normal.     LABORATORY DATA:  I have reviewed the data as listed    Latest Ref Rng & Units 12/07/2021    1:14 PM 10/05/2021    2:11 PM 09/10/2021   11:29 PM  CBC  WBC 4.0 - 10.5 K/uL 8.5  9.8  9.9   Hemoglobin 13.0 - 17.0 g/dL 17.2  17.1  17.2   Hematocrit 39.0 - 52.0 % 52.6  53.4  53.3   Platelets 150 - 400 K/uL 232  242  224       Latest Ref Rng & Units 12/07/2021    1:14 PM 09/10/2021   11:29 PM 08/06/2021    1:37 PM  CMP  Glucose 70 - 99 mg/dL 88  89  96   BUN 8 - 23 mg/dL '18  17  22   ' Creatinine 0.61 - 1.24 mg/dL 1.45  1.56  1.50   Sodium 135 - 145 mmol/L 134  138  137   Potassium 3.5 - 5.1 mmol/L 4.1  4.3  4.1   Chloride 98 - 111 mmol/L 105  107  104   CO2 22 - 32 mmol/L '23  22  25   ' Calcium 8.9 - 10.3 mg/dL 8.6  9.1  9.2   Total Protein 6.5 - 8.1 g/dL 7.9   8.2   Total Bilirubin 0.3 - 1.2 mg/dL 0.6   0.3   Alkaline Phos 38 - 126 U/L 74   79   AST 15 - 41 U/L 17   17   ALT 0 - 44 U/L 19   18      Bone marrow biopsy  03/21/2017  Bone Marrow, Aspirate,Biopsy, and Clot, left iliac - HYPERCELLULAR BONE MARROW FOR AGE WITH TRILINEAGE HEMATOPOIESIS. - PLASMACYTOSIS (PLASMA CELLS 8%)  Karyotype: loss of Y chromosome Cytogenetic MDS FISH Panel negative.   Bone marrow 04/06/2017  Bone Marrow, Aspirate,Biopsy, and Clot, right iliac and core - HYPERCELLULAR BONE MARROW FOR AGE WITH PLASMA CELL NEOPLASM - TRILINEAGE HEMATOPOIESIS. - SEE COMMENT. PERIPHERAL BLOOD: - MICROCYTIC-HYPOCHROMIC ANEMIA. Diagnosis Note The bone marrow is hypercellular with trilineage hematopoiesis but with relative abundance of erythroid precursors and increased number of megakaryocytes with nonspecific changes. Significant dyspoiesis is not seen. Iron stores are present with no ring sideroblasts. The plasma cells are increased in number representing 8% of all cells in the aspirate although focal areas in the core biopsy show 10 to 20% as primarily seen by CD138 stain. In situ hybridization for kappa and lambda light chains show kappa light chain restriction consistent with plasma cell neoplasm. Correlation with cytogenetic and FISH studies is recommended. (BNS:ecj/gt 04/07/2017)  IMAGE STUDIES I have personally reviewed below image results.  03/15/2017 DG bone survey Met: Nonspecific lucent lesion in the distal right ulna measuring 15-16 mm, but otherwise normal bone mineralization for age throughout the visible skeleton. A solitary lytic lesion in a distal extremity would be an unusual presentation of multiple myeloma, and I favor the distal right ulna lesion is benign. Recommend correlation with serum and urine protein electrophoresis.  04/14/2017 PET scan: 1. 2.1 cm hypermetabolic soft tissue lesion in the left heel, adjacent to the calcaneal tuberosity. 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 2. Mottled  FDG accumulation diffusely in the marrow space without other frankly overt hypermetabolic bony lesion. 3. Coronary artery and  thoracoabdominal aortic atherosclerosis with abdominal aortic stent graft visualized in situ. 4. Bilateral renal cysts and bilateral nonobstructing renal stones.   08/24/2017 PET scan 1. No hypermetabolic osseous lesions. No definite signs of multiple myeloma on CT imaging. 2. Interval improvement in soft tissue activity along the medial aspect of the left calcaneal tuberosity, likely plantar fasciitis. 3. New small left pleural effusion. 4. Bilateral inguinal hernias containing small bowel on the right and sigmoid colon on the left. No evidence of incarceration or obstruction. 5. Otherwise stable incidental findings including diffuse atherosclerosis, bilateral renal cysts, emphysema and sigmoid diverticulosis.

## 2021-12-07 NOTE — Assessment & Plan Note (Signed)
secondary to chronic blood loss due to multiple AVM in small bowels. Status post enteroscopy at [facility] for AVM ablation. No signs of iron deficiency currently.

## 2021-12-09 LAB — KAPPA/LAMBDA LIGHT CHAINS
Kappa free light chain: 183.9 mg/L — ABNORMAL HIGH (ref 3.3–19.4)
Kappa, lambda light chain ratio: 5.14 — ABNORMAL HIGH (ref 0.26–1.65)
Lambda free light chains: 35.8 mg/L — ABNORMAL HIGH (ref 5.7–26.3)

## 2021-12-11 ENCOUNTER — Ambulatory Visit (HOSPITAL_COMMUNITY)
Admission: RE | Admit: 2021-12-11 | Discharge: 2021-12-11 | Disposition: A | Discharge status: Home / Self Care | Payer: Medicare PPO | Source: Ambulatory Visit | Attending: Family Medicine | Admitting: Family Medicine

## 2021-12-11 DIAGNOSIS — E119 Type 2 diabetes mellitus without complications: Secondary | ICD-10-CM | POA: Diagnosis not present

## 2021-12-11 DIAGNOSIS — E785 Hyperlipidemia, unspecified: Secondary | ICD-10-CM | POA: Diagnosis not present

## 2021-12-11 DIAGNOSIS — M4316 Spondylolisthesis, lumbar region: Secondary | ICD-10-CM | POA: Diagnosis not present

## 2021-12-11 DIAGNOSIS — I951 Orthostatic hypotension: Secondary | ICD-10-CM | POA: Diagnosis not present

## 2021-12-11 DIAGNOSIS — M5416 Radiculopathy, lumbar region: Secondary | ICD-10-CM | POA: Insufficient documentation

## 2021-12-11 DIAGNOSIS — E782 Mixed hyperlipidemia: Secondary | ICD-10-CM | POA: Diagnosis not present

## 2021-12-11 DIAGNOSIS — Z0001 Encounter for general adult medical examination with abnormal findings: Secondary | ICD-10-CM | POA: Diagnosis not present

## 2021-12-11 DIAGNOSIS — M5126 Other intervertebral disc displacement, lumbar region: Secondary | ICD-10-CM | POA: Diagnosis not present

## 2021-12-11 DIAGNOSIS — I10 Essential (primary) hypertension: Secondary | ICD-10-CM | POA: Diagnosis not present

## 2021-12-11 DIAGNOSIS — Z1211 Encounter for screening for malignant neoplasm of colon: Secondary | ICD-10-CM | POA: Diagnosis not present

## 2021-12-14 LAB — MULTIPLE MYELOMA PANEL, SERUM
Albumin SerPl Elph-Mcnc: 3.2 g/dL (ref 2.9–4.4)
Albumin/Glob SerPl: 0.9 (ref 0.7–1.7)
Alpha 1: 0.3 g/dL (ref 0.0–0.4)
Alpha2 Glob SerPl Elph-Mcnc: 0.8 g/dL (ref 0.4–1.0)
B-Globulin SerPl Elph-Mcnc: 1 g/dL (ref 0.7–1.3)
Gamma Glob SerPl Elph-Mcnc: 1.9 g/dL — ABNORMAL HIGH (ref 0.4–1.8)
Globulin, Total: 4 g/dL — ABNORMAL HIGH (ref 2.2–3.9)
IgA: 310 mg/dL (ref 61–437)
IgG (Immunoglobin G), Serum: 2018 mg/dL — ABNORMAL HIGH (ref 603–1613)
IgM (Immunoglobulin M), Srm: 135 mg/dL (ref 15–143)
Total Protein ELP: 7.2 g/dL (ref 6.0–8.5)

## 2021-12-15 DIAGNOSIS — I10 Essential (primary) hypertension: Secondary | ICD-10-CM | POA: Diagnosis not present

## 2021-12-15 DIAGNOSIS — J309 Allergic rhinitis, unspecified: Secondary | ICD-10-CM | POA: Diagnosis not present

## 2021-12-15 DIAGNOSIS — F17200 Nicotine dependence, unspecified, uncomplicated: Secondary | ICD-10-CM | POA: Diagnosis not present

## 2021-12-15 DIAGNOSIS — M109 Gout, unspecified: Secondary | ICD-10-CM | POA: Diagnosis not present

## 2021-12-15 DIAGNOSIS — E119 Type 2 diabetes mellitus without complications: Secondary | ICD-10-CM | POA: Diagnosis not present

## 2021-12-15 DIAGNOSIS — F411 Generalized anxiety disorder: Secondary | ICD-10-CM | POA: Diagnosis not present

## 2021-12-15 DIAGNOSIS — J449 Chronic obstructive pulmonary disease, unspecified: Secondary | ICD-10-CM | POA: Diagnosis not present

## 2021-12-15 DIAGNOSIS — N181 Chronic kidney disease, stage 1: Secondary | ICD-10-CM | POA: Diagnosis not present

## 2021-12-15 DIAGNOSIS — M150 Primary generalized (osteo)arthritis: Secondary | ICD-10-CM | POA: Diagnosis not present

## 2021-12-18 DIAGNOSIS — M5416 Radiculopathy, lumbar region: Secondary | ICD-10-CM | POA: Diagnosis not present

## 2021-12-18 DIAGNOSIS — M4807 Spinal stenosis, lumbosacral region: Secondary | ICD-10-CM | POA: Diagnosis not present

## 2021-12-18 DIAGNOSIS — M19042 Primary osteoarthritis, left hand: Secondary | ICD-10-CM | POA: Diagnosis not present

## 2021-12-18 DIAGNOSIS — M0579 Rheumatoid arthritis with rheumatoid factor of multiple sites without organ or systems involvement: Secondary | ICD-10-CM | POA: Diagnosis not present

## 2021-12-18 DIAGNOSIS — M19041 Primary osteoarthritis, right hand: Secondary | ICD-10-CM | POA: Diagnosis not present

## 2021-12-18 DIAGNOSIS — Z79899 Other long term (current) drug therapy: Secondary | ICD-10-CM | POA: Diagnosis not present

## 2021-12-18 DIAGNOSIS — M5136 Other intervertebral disc degeneration, lumbar region: Secondary | ICD-10-CM | POA: Diagnosis not present

## 2021-12-29 DIAGNOSIS — M12852 Other specific arthropathies, not elsewhere classified, left hip: Secondary | ICD-10-CM | POA: Diagnosis not present

## 2021-12-29 DIAGNOSIS — I2510 Atherosclerotic heart disease of native coronary artery without angina pectoris: Secondary | ICD-10-CM | POA: Diagnosis not present

## 2021-12-29 DIAGNOSIS — I10 Essential (primary) hypertension: Secondary | ICD-10-CM | POA: Diagnosis not present

## 2021-12-29 DIAGNOSIS — E782 Mixed hyperlipidemia: Secondary | ICD-10-CM | POA: Diagnosis not present

## 2021-12-29 DIAGNOSIS — G4733 Obstructive sleep apnea (adult) (pediatric): Secondary | ICD-10-CM | POA: Diagnosis not present

## 2022-01-07 ENCOUNTER — Encounter: Payer: Self-pay | Admitting: Podiatry

## 2022-01-07 ENCOUNTER — Ambulatory Visit (INDEPENDENT_AMBULATORY_CARE_PROVIDER_SITE_OTHER): Payer: Medicare PPO | Admitting: Podiatry

## 2022-01-07 DIAGNOSIS — M2011 Hallux valgus (acquired), right foot: Secondary | ICD-10-CM

## 2022-01-07 DIAGNOSIS — D689 Coagulation defect, unspecified: Secondary | ICD-10-CM | POA: Diagnosis not present

## 2022-01-07 DIAGNOSIS — B351 Tinea unguium: Secondary | ICD-10-CM

## 2022-01-07 DIAGNOSIS — M79609 Pain in unspecified limb: Secondary | ICD-10-CM | POA: Diagnosis not present

## 2022-01-07 DIAGNOSIS — E1140 Type 2 diabetes mellitus with diabetic neuropathy, unspecified: Secondary | ICD-10-CM

## 2022-01-07 DIAGNOSIS — I739 Peripheral vascular disease, unspecified: Secondary | ICD-10-CM

## 2022-01-07 DIAGNOSIS — M2012 Hallux valgus (acquired), left foot: Secondary | ICD-10-CM

## 2022-01-07 NOTE — Progress Notes (Signed)
This patient returns to my office for at risk foot care.  This patient requires this care by a professional since this patient will be at risk due to having PAD and diabetes. And coagulation defect.  Patient is taking eliquiss.  This patient is unable to cut nails himself since the patient cannot reach his nails.These nails are painful walking and wearing shoes.  This patient presents for at risk foot care today.  Patient has self inflicted cut along the medial border right big toe.  General Appearance  Alert, conversant and in no acute stress.  Vascular  Dorsalis pedis and posterior tibial  pulses are not  palpable  bilaterally.  Capillary return is within normal limits  bilaterally. Temperature is within normal limits  bilaterally.  Neurologic  Senn-Weinstein monofilament wire test within normal limits  bilaterally. Muscle power within normal limits bilaterally.  Nails Thick disfigured discolored nails with subungual debris  from hallux to fifth toes bilaterally. No evidence of bacterial infection or drainage bilaterally.  Orthopedic  No limitations of motion  feet .  No crepitus or effusions noted.  No bony pathology or digital deformities noted.  HAV wuth hammer toe  B/L.  Rigid contracted second digit right foot.  Skin  normotropic skin with no porokeratosis noted bilaterally.  No signs of infections or ulcers noted.     Onychomycosis  Pain in right toes  Pain in left toes  Consent was obtained for treatment procedures.   Mechanical debridement of nails 1-5  bilaterally performed with a nail nipper.  Filed with dremel without incident. Self inflicted cut is healing.     Return office visit    10 weeks                 Told patient to return for periodic foot care and evaluation due to potential at risk complications.   Evertt Chouinard DPM  

## 2022-01-11 ENCOUNTER — Other Ambulatory Visit: Payer: Self-pay | Admitting: *Deleted

## 2022-01-11 DIAGNOSIS — E119 Type 2 diabetes mellitus without complications: Secondary | ICD-10-CM | POA: Diagnosis not present

## 2022-01-11 DIAGNOSIS — I10 Essential (primary) hypertension: Secondary | ICD-10-CM | POA: Diagnosis not present

## 2022-01-11 DIAGNOSIS — S39012A Strain of muscle, fascia and tendon of lower back, initial encounter: Secondary | ICD-10-CM | POA: Diagnosis not present

## 2022-01-11 DIAGNOSIS — J449 Chronic obstructive pulmonary disease, unspecified: Secondary | ICD-10-CM | POA: Diagnosis not present

## 2022-01-11 DIAGNOSIS — N181 Chronic kidney disease, stage 1: Secondary | ICD-10-CM | POA: Diagnosis not present

## 2022-01-11 DIAGNOSIS — F17200 Nicotine dependence, unspecified, uncomplicated: Secondary | ICD-10-CM | POA: Diagnosis not present

## 2022-01-11 DIAGNOSIS — M150 Primary generalized (osteo)arthritis: Secondary | ICD-10-CM | POA: Diagnosis not present

## 2022-01-11 DIAGNOSIS — M25552 Pain in left hip: Secondary | ICD-10-CM | POA: Diagnosis not present

## 2022-01-11 DIAGNOSIS — F411 Generalized anxiety disorder: Secondary | ICD-10-CM | POA: Diagnosis not present

## 2022-01-11 NOTE — Patient Outreach (Signed)
  Care Coordination   01/11/2022 Name: Omar [name] MRN: [id number] DOB: [birth year]   Care Coordination Outreach Attempts:  Contact was made with the patient today to offer care coordination services as a benefit of their health plan. Patient declined services.  Follow Up Plan:  No further outreach attempts will be made at this time.    Encounter Outcome:  Pt. Refused  Care Coordination Interventions Activated:  No   Care Coordination Interventions:  No, not indicated    Emelia Loron RN, BSN [place] [phone] [email or web address]

## 2022-01-21 DIAGNOSIS — I10 Essential (primary) hypertension: Secondary | ICD-10-CM | POA: Diagnosis not present

## 2022-01-21 DIAGNOSIS — K219 Gastro-esophageal reflux disease without esophagitis: Secondary | ICD-10-CM | POA: Diagnosis not present

## 2022-01-21 DIAGNOSIS — E782 Mixed hyperlipidemia: Secondary | ICD-10-CM | POA: Diagnosis not present

## 2022-01-21 DIAGNOSIS — I4891 Unspecified atrial fibrillation: Secondary | ICD-10-CM | POA: Diagnosis not present

## 2022-01-21 DIAGNOSIS — G4733 Obstructive sleep apnea (adult) (pediatric): Secondary | ICD-10-CM | POA: Diagnosis not present

## 2022-01-21 DIAGNOSIS — I7389 Other specified peripheral vascular diseases: Secondary | ICD-10-CM | POA: Diagnosis not present

## 2022-01-21 DIAGNOSIS — I340 Nonrheumatic mitral (valve) insufficiency: Secondary | ICD-10-CM | POA: Diagnosis not present

## 2022-01-21 DIAGNOSIS — F17200 Nicotine dependence, unspecified, uncomplicated: Secondary | ICD-10-CM | POA: Diagnosis not present

## 2022-01-21 DIAGNOSIS — I351 Nonrheumatic aortic (valve) insufficiency: Secondary | ICD-10-CM | POA: Diagnosis not present

## 2022-03-22 DIAGNOSIS — N181 Chronic kidney disease, stage 1: Secondary | ICD-10-CM | POA: Diagnosis not present

## 2022-03-22 DIAGNOSIS — M25552 Pain in left hip: Secondary | ICD-10-CM | POA: Diagnosis not present

## 2022-03-22 DIAGNOSIS — I10 Essential (primary) hypertension: Secondary | ICD-10-CM | POA: Diagnosis not present

## 2022-03-22 DIAGNOSIS — F17200 Nicotine dependence, unspecified, uncomplicated: Secondary | ICD-10-CM | POA: Diagnosis not present

## 2022-03-22 DIAGNOSIS — E119 Type 2 diabetes mellitus without complications: Secondary | ICD-10-CM | POA: Diagnosis not present

## 2022-03-22 DIAGNOSIS — J449 Chronic obstructive pulmonary disease, unspecified: Secondary | ICD-10-CM | POA: Diagnosis not present

## 2022-03-22 DIAGNOSIS — M150 Primary generalized (osteo)arthritis: Secondary | ICD-10-CM | POA: Diagnosis not present

## 2022-03-22 DIAGNOSIS — M109 Gout, unspecified: Secondary | ICD-10-CM | POA: Diagnosis not present

## 2022-03-22 DIAGNOSIS — F411 Generalized anxiety disorder: Secondary | ICD-10-CM | POA: Diagnosis not present

## 2022-03-23 DIAGNOSIS — I10 Essential (primary) hypertension: Secondary | ICD-10-CM | POA: Diagnosis not present

## 2022-03-23 DIAGNOSIS — E119 Type 2 diabetes mellitus without complications: Secondary | ICD-10-CM | POA: Diagnosis not present

## 2022-03-23 DIAGNOSIS — E782 Mixed hyperlipidemia: Secondary | ICD-10-CM | POA: Diagnosis not present

## 2022-04-01 ENCOUNTER — Ambulatory Visit (INDEPENDENT_AMBULATORY_CARE_PROVIDER_SITE_OTHER): Payer: Medicare PPO | Admitting: Podiatry

## 2022-04-01 DIAGNOSIS — M2011 Hallux valgus (acquired), right foot: Secondary | ICD-10-CM | POA: Diagnosis not present

## 2022-04-01 DIAGNOSIS — M2012 Hallux valgus (acquired), left foot: Secondary | ICD-10-CM

## 2022-04-01 DIAGNOSIS — D689 Coagulation defect, unspecified: Secondary | ICD-10-CM | POA: Diagnosis not present

## 2022-04-01 DIAGNOSIS — I739 Peripheral vascular disease, unspecified: Secondary | ICD-10-CM | POA: Diagnosis not present

## 2022-04-01 DIAGNOSIS — E1140 Type 2 diabetes mellitus with diabetic neuropathy, unspecified: Secondary | ICD-10-CM

## 2022-04-01 DIAGNOSIS — M79609 Pain in unspecified limb: Secondary | ICD-10-CM | POA: Diagnosis not present

## 2022-04-01 DIAGNOSIS — B351 Tinea unguium: Secondary | ICD-10-CM

## 2022-04-01 NOTE — Progress Notes (Signed)
This patient returns to my office for at risk foot care.  This patient requires this care by a professional since this patient will be at risk due to having PAD and diabetes. And coagulation defect.  Patient is taking eliquiss.  This patient is unable to cut nails himself since the patient cannot reach his nails.These nails are painful walking and wearing shoes.  This patient presents for at risk foot care today.  Patient has self inflicted cut along the medial border right big toe.  General Appearance  Alert, conversant and in no acute stress.  Vascular  Dorsalis pedis and posterior tibial  pulses are not  palpable  bilaterally.  Capillary return is within normal limits  bilaterally. Temperature is within normal limits  bilaterally.  Neurologic  Senn-Weinstein monofilament wire test within normal limits  bilaterally. Muscle power within normal limits bilaterally.  Nails Thick disfigured discolored nails with subungual debris  from hallux to fifth toes bilaterally. No evidence of bacterial infection or drainage bilaterally.  Orthopedic  No limitations of motion  feet .  No crepitus or effusions noted.  No bony pathology or digital deformities noted.  HAV wuth hammer toe  B/L.  Rigid contracted second digit right foot.  Skin  normotropic skin with no porokeratosis noted bilaterally.  No signs of infections or ulcers noted.     Onychomycosis  Pain in right toes  Pain in left toes  Consent was obtained for treatment procedures.   Mechanical debridement of nails 1-5  bilaterally performed with a nail nipper.  Filed with dremel without incident. Self inflicted cut is healing.     Return office visit    10 weeks                 Told patient to return for periodic foot care and evaluation due to potential at risk complications.   Gardiner Barefoot DPM

## 2022-04-05 ENCOUNTER — Encounter (INDEPENDENT_AMBULATORY_CARE_PROVIDER_SITE_OTHER): Payer: Self-pay

## 2022-04-05 ENCOUNTER — Inpatient Hospital Stay: Payer: Medicare PPO | Attending: Oncology

## 2022-04-05 ENCOUNTER — Encounter: Payer: Self-pay | Admitting: Oncology

## 2022-04-05 ENCOUNTER — Other Ambulatory Visit: Payer: Self-pay

## 2022-04-05 ENCOUNTER — Inpatient Hospital Stay (HOSPITAL_BASED_OUTPATIENT_CLINIC_OR_DEPARTMENT_OTHER): Payer: Medicare PPO | Admitting: Oncology

## 2022-04-05 ENCOUNTER — Inpatient Hospital Stay: Payer: Medicare PPO

## 2022-04-05 VITALS — BP 86/61 | HR 67 | Temp 97.2°F | Resp 18 | Wt 194.8 lb

## 2022-04-05 DIAGNOSIS — Z7901 Long term (current) use of anticoagulants: Secondary | ICD-10-CM | POA: Diagnosis not present

## 2022-04-05 DIAGNOSIS — D472 Monoclonal gammopathy: Secondary | ICD-10-CM

## 2022-04-05 DIAGNOSIS — R634 Abnormal weight loss: Secondary | ICD-10-CM | POA: Diagnosis not present

## 2022-04-05 DIAGNOSIS — D631 Anemia in chronic kidney disease: Secondary | ICD-10-CM | POA: Insufficient documentation

## 2022-04-05 DIAGNOSIS — N183 Chronic kidney disease, stage 3 unspecified: Secondary | ICD-10-CM | POA: Diagnosis not present

## 2022-04-05 DIAGNOSIS — C61 Malignant neoplasm of prostate: Secondary | ICD-10-CM

## 2022-04-05 DIAGNOSIS — Z79899 Other long term (current) drug therapy: Secondary | ICD-10-CM | POA: Diagnosis not present

## 2022-04-05 DIAGNOSIS — D751 Secondary polycythemia: Secondary | ICD-10-CM | POA: Diagnosis not present

## 2022-04-05 DIAGNOSIS — F17200 Nicotine dependence, unspecified, uncomplicated: Secondary | ICD-10-CM

## 2022-04-05 DIAGNOSIS — F17210 Nicotine dependence, cigarettes, uncomplicated: Secondary | ICD-10-CM | POA: Diagnosis not present

## 2022-04-05 DIAGNOSIS — Z7951 Long term (current) use of inhaled steroids: Secondary | ICD-10-CM | POA: Diagnosis not present

## 2022-04-05 DIAGNOSIS — Z79631 Long term (current) use of antimetabolite agent: Secondary | ICD-10-CM | POA: Insufficient documentation

## 2022-04-05 DIAGNOSIS — M069 Rheumatoid arthritis, unspecified: Secondary | ICD-10-CM | POA: Insufficient documentation

## 2022-04-05 DIAGNOSIS — C9000 Multiple myeloma not having achieved remission: Secondary | ICD-10-CM | POA: Insufficient documentation

## 2022-04-05 LAB — CBC WITH DIFFERENTIAL/PLATELET
Abs Immature Granulocytes: 0.03 10*3/uL (ref 0.00–0.07)
Basophils Absolute: 0 10*3/uL (ref 0.0–0.1)
Basophils Relative: 0 %
Eosinophils Absolute: 0.3 10*3/uL (ref 0.0–0.5)
Eosinophils Relative: 4 %
HCT: 47.5 % (ref 39.0–52.0)
Hemoglobin: 15.3 g/dL (ref 13.0–17.0)
Immature Granulocytes: 0 %
Lymphocytes Relative: 14 %
Lymphs Abs: 1.2 10*3/uL (ref 0.7–4.0)
MCH: 28.7 pg (ref 26.0–34.0)
MCHC: 32.2 g/dL (ref 30.0–36.0)
MCV: 89 fL (ref 80.0–100.0)
Monocytes Absolute: 0.6 10*3/uL (ref 0.1–1.0)
Monocytes Relative: 7 %
Neutro Abs: 6.5 10*3/uL (ref 1.7–7.7)
Neutrophils Relative %: 75 %
Platelets: 238 10*3/uL (ref 150–400)
RBC: 5.34 MIL/uL (ref 4.22–5.81)
RDW: 16.5 % — ABNORMAL HIGH (ref 11.5–15.5)
WBC: 8.6 10*3/uL (ref 4.0–10.5)
nRBC: 0 % (ref 0.0–0.2)

## 2022-04-05 LAB — COMPREHENSIVE METABOLIC PANEL
ALT: 13 U/L (ref 0–44)
AST: 16 U/L (ref 15–41)
Albumin: 3.3 g/dL — ABNORMAL LOW (ref 3.5–5.0)
Alkaline Phosphatase: 70 U/L (ref 38–126)
Anion gap: 4 — ABNORMAL LOW (ref 5–15)
BUN: 20 mg/dL (ref 8–23)
CO2: 21 mmol/L — ABNORMAL LOW (ref 22–32)
Calcium: 8.7 mg/dL — ABNORMAL LOW (ref 8.9–10.3)
Chloride: 112 mmol/L — ABNORMAL HIGH (ref 98–111)
Creatinine, Ser: 1.3 mg/dL — ABNORMAL HIGH (ref 0.61–1.24)
GFR, Estimated: 56 mL/min — ABNORMAL LOW (ref >60)
Glucose, Bld: 99 mg/dL (ref 70–99)
Potassium: 4 mmol/L (ref 3.5–5.1)
Sodium: 137 mmol/L (ref 135–145)
Total Bilirubin: 0.4 mg/dL (ref 0.3–1.2)
Total Protein: 7.8 g/dL (ref 6.5–8.1)

## 2022-04-05 LAB — PSA: Prostatic Specific Antigen: 0.11 ng/mL (ref 0.00–4.00)

## 2022-04-05 LAB — TSH: TSH: 1.187 u[IU]/mL (ref 0.350–4.500)

## 2022-04-05 NOTE — Assessment & Plan Note (Signed)
Smoldering kappa light chain multiple myeloma/active myeloma if previous hypermetabolic lesion on PET scan was related to myeloma.Patient was treated with 4 cycles of RVD.  He is not currently on any maintenance therapy due to multiple comorbidities, borderline smoldering multiple myeloma status.    Multiple myeloma panel has been monitored and results are pending.  His kidney function is stable, no anemia.  No hypercalcemia. Consider repeat bone marrow biopsy if he starts to show signs of organ damage

## 2022-04-05 NOTE — Assessment & Plan Note (Signed)
Encourage his smoke cessation effort.  Erythrocytosis has resolved.  

## 2022-04-05 NOTE — Assessment & Plan Note (Signed)
Continue monitor PSA

## 2022-04-05 NOTE — Progress Notes (Signed)
Hematology/Oncology Progress note [phone] [phone]     Patient Care Team: Jodi Marble, MD as PCP - General (Internal Medicine) Bary Castilla, Forest Gleason, MD (General Surgery) Edrick Kins, MD as Rounding Team (Internal Medicine) Hillary Bow, MD (Inactive) as Consulting Physician (Internal Medicine) [name], MD as Surgeon (Gastroenterology) Isaias Cowman, MD as Consulting Physician (Cardiology) Earlie Server, MD as Consulting Physician (Oncology)   ASSESSMENT & PLAN:   Smoldering multiple myeloma Smoldering kappa light chain multiple myeloma/active myeloma if previous hypermetabolic lesion on PET scan was related to myeloma.Patient was treated with 4 cycles of RVD.  He is not currently on any maintenance therapy due to multiple comorbidities, borderline smoldering multiple myeloma status.    Multiple myeloma panel has been monitored and results are pending.  His kidney function is stable, no anemia.  No hypercalcemia. Consider repeat bone marrow biopsy if he starts to show signs of organ damage  Malignant neoplasm of prostate ([place]) Continue monitor PSA   Tobacco use disorder Encourage his smoke cessation effort.  Erythrocytosis has resolved.   Unintentional weight loss Normal TSH CT renal protocol in April 2023 reviewed  Check CT chest wo contrast.   Orders Placed This Encounter  Procedures   CT Chest Wo Contrast    Standing Status:   Future    Standing Expiration Date:   04/05/2023    Order Specific Question:   Preferred imaging location?    Answer:   [facility]   TSH    Standing Status:   Future    Number of Occurrences:   1    Standing Expiration Date:   04/06/2023   Comprehensive metabolic panel    Standing Status:   Future    Standing Expiration Date:   04/05/2023   CBC with Differential/Platelet    Standing Status:   Future    Standing Expiration Date:   04/06/2023   Kappa/lambda light chains    Standing  Status:   Future    Standing Expiration Date:   04/05/2023   Multiple Myeloma Panel (SPEP&IFE w/QIG)    Standing Status:   Future    Standing Expiration Date:   04/05/2023   PSA    Standing Status:   Future    Standing Expiration Date:   04/06/2023   Follow up in 4 months.  All questions were answered. The patient knows to call the clinic with any problems, questions or concerns.  Earlie Server, MD, PhD [facility] Hematology Oncology 04/05/2022     HISTORY OF PRESENTING ILLNESS:  81 y.o.  male with PMH listed below who presents to follow up on the evaluation and management of his abnormal urine protein electrophoresis results. I reviewed the records from [facility], [place] and [place] was performed and HemOnc related medical problems are listed below.  He lives with a friend. He has 3 adult children.   1 Chronic Kidney disease, Stage III: patient follows up with Dr.Lateef.  2 Proteinuria:  02/18/2017: Albumin/Creatinine ratio was 7, Urine protein electrophoresis,Random Urine revealed M Spike 43.2%, total protein of 43.84m/dl,  01/05/2017 Albumin/Creatinine ratio was 455.2, Albumin 996.5, , 2.15.2018 Albumin/Creatinine ratio was 80.3, Albumin 73.3, Urine protein electrophoresis,Random Urine revealed M Spike 29.7%, total protein of 29.140mdl, 06/30/2016 Serum protein electrophoresis did not detect M spike.  Autoimmune disorder work up showed Negative anti dsDNA, RNP antibodies, smith antibody, Sjogren antibodies, anti Jo-1, positive antichromotin antibodies, positive ANA,  3 Anemia of chronic kidney disease:  4 Iron  deficiency anemia: history of iron deficiency, ferritin was 7 on 08/11/2016. He had EGD and colonoscopy that were done this year which showed duodenitis/esphagitis/diverticulosis/benigh polyps. # small bowel AVMs was ablated at [facility].   5 Rheumatoid arthritis: he follows up with Dr.Kernodle and is on chronic MTX. He Is currently off  MTX reports joint pains are controlled, not quite symptomatic.   Patient denies any persistent bone pain or any pain. He continue to feel lack of energy, persistent, not improved with resting or taking naps. He also has lost 25 pounds in the past year which was unintentional.   # Rheumatoid arthritis and currently is off MTX.  # It is ambiguous whether he has truly symptomatic multiple myeloma or a smoldering myeloma, given that anemia can be secondary to iron deficiency and CKD.  The hypermetabolic 2.1 cm hypermetabolic soft tissue lesion in the left heel, can reflect a Plasmacytoma,which should have been biopsied. However patient initially denies being on any blood thinner, later he was found to be on Plavix with pre biopsy questionnaire screening, and biopsy was held. Patient has to contact cardiologist to hold plavix for seven days before procedure. He feels frustrated about multiple workup and meanwhile he becomes more fatigues, with worsening of kidney function. Patient was reluctant about proceeding additional invasive procedures and wants to start treatment. Decision was made to start active MM treatment.   # He was evaluated by [facility] bone marrow transplant team for autologous bone marrow transplant. He is not considered to  good candidate for transplant. Patient cardiologist Dr. Chancy Milroy had started patient on Lasix 20 mg daily. Lasix was discontinued by [facility] bone marrow transplant team as patient was having dizziness and borderline blood pressure during his clinic visit there.  10/02/2018 He had follow-up left foot/ankle x-ray as a follow-up of his left heel lesion which was initially presented on PET scan and not detectable on follow-up PET scan.  Images were reviewed by me.  No suspicious acute or subacute osseous abnormality  Current Treatment:  S/p RVD x 4 cycles tolerates well except cytopenia.  Revlimid 10 mg PO once per day on days 1 to 14 (dose reduced due to GFR) Revlimid renal dosing of  19m,  Velcade 1.3 mg/m2 IV once per day on days 1,  8, 15 Dexamethasone 20 mg PO once per day on days 1, 8, 15 given patient's diabetes Given patient's multiple comorbidity and the side effects of cytopenia during his RVD treatment and the uncertainty of this is really a site of plasmacytoma or not, I recommend not to continue him on maintenance Revlimid.   # repeat small bowel capsule study on 02/16/2019 Which showed multiple AVMs seen in the mid jejunum proximal to the site of prior tattoo. # [facility]2019 double-balloon enteroscopy with findings of 5 AVMs in the jejunum and 2 AVMs in the duodenum and AVMs were ablated with APC.  Tattoo was seen in the area. If further bleeding, recommend to consider treatment with octreotide. Patient takes Protonix daily.    INTERVAL HISTORY Patient presents for follow-up of management of multiple myeloma/smoldering multiple myeloma and iron deficiency, secondary erythrocytosis. Patient has tried to smoke less.  Patient has sleep apnea, he uses CPAP machine as much as he can, approximately 2-3 times per week. He has no new complaints.    Review of Systems  Constitutional:  Positive for fatigue. Negative for appetite change, chills, fever and unexpected weight change.  HENT:  Negative for hearing loss and voice change.   Eyes:  Negative for eye problems and icterus.  Respiratory:  Negative for chest tightness, cough and shortness of breath.   Cardiovascular:  Negative for chest pain and leg swelling.  Gastrointestinal:  Negative for abdominal distention, abdominal pain and diarrhea.  Endocrine: Negative for hot flashes.  Genitourinary:  Negative for difficulty urinating, dysuria and frequency.   Musculoskeletal:  Positive for arthralgias.  Skin:  Negative for itching and rash.  Neurological:  Negative for light-headedness and numbness.  Hematological:  Negative for adenopathy. Does not bruise/bleed  easily.  Psychiatric/Behavioral:  Negative for confusion.      MEDICAL HISTORY:  Past Medical History:  Diagnosis Date   AAA (abdominal aortic aneurysm) ([place])    a.) s/p EVAR 11/05/2013.   Anxiety    a.) on BZO (alprazolam) PRN   Aortic atherosclerosis (HCC)    Atrial fibrillation (HCC)    a.) CHA2DS2-VASc = 6 (age x2, CHF, HTN, PVD/aortic plaque, T2DM). b.) rate/rhythm maintained without use of pharmacological intervention; chronically anticoagulated using dose reduced apixaban.   BPH (benign prostatic hyperplasia)    CHF (congestive heart failure) ([place])    a.)  TTE 03/16/2021: EF 63%; moderate LVH; mild BAE mild to moderate pan valvular regurgitation; G1DD.   CKD (chronic kidney disease), stage III (HCC)    Colon polyp 07/07/2015   TUBULAR ADENOMA WITH AT LEAST HIGH-GRADE / Dr Rayann Heman   COPD (chronic obstructive pulmonary disease) ([place])    Coronary artery disease    a.) coronary CTA 01/10/2015 CA score 722.3; mod-sev LAD disease; mild LCx disease; 50-60% RCA disease.   Current use of long term anticoagulation    a.) dose reduced apixaban   Diverticulosis    Erectile dysfunction    GERD (gastroesophageal reflux disease)    Gout    Heart murmur    Hepatic cyst    Hepatic steatosis    Hepatitis    Hypercholesterolemia    Hypertension    Iron deficiency anemia    Migraines    Multiple myeloma (HCC)    Nephrolithiasis    OSA on CPAP    Presence of permanent cardiac pacemaker    Prostate cancer ([place]) 01/01/13, 01/30/14   Gleason 3+4=7, volume 46.6 cc   PVD (peripheral vascular disease) ([place])    Renal cyst    Rheumatoid arthritis ([place])    S/P radiation therapy  04/03/2014 through 06/04/2014                                                      Prostate 7800 cGy in 40 sessions                           SSS (sick sinus syndrome) ([place])    a.) s/p Medtronic Azure PPM placement 08/26/2016   T2DM (type 2 diabetes mellitus) ([place])    Third degree heart block ([place]) 08/26/2016   a.)  s/p PPM placement (Medtronic Azure XT DR MRI [id number] S/N: [id number]) on 08/26/2016   Vitamin D deficiency     SURGICAL HISTORY: Past Surgical History:  Procedure Laterality Date   ABDOMINAL AORTIC ANEURYSM REPAIR  11/05/2013   COLON SURGERY  08/06/2015   Right hemicolectomy for tubulovillous adenoma with high-grade dysplasia.   COLONOSCOPY WITH PROPOFOL N/A 07/07/2015  Procedure: COLONOSCOPY WITH PROPOFOL;  Surgeon: Josefine Class, MD;  Location: [facility] ENDOSCOPY;  Service: Endoscopy;  Laterality: N/A;   COLONOSCOPY WITH PROPOFOL N/A 10/28/2016   Procedure: COLONOSCOPY WITH PROPOFOL;  Surgeon: [name], MD;  Location[facility] END[facility]COPY;  Service: Endoscopy;  Laterality: N/A;   CYSTOSCOPY W/ RETROGRADES Bilateral 11/16/2021   Procedure: CYSTOSCOPY WITH RETROGRADE PYELOGRAM;  Surgeon: Hollice Espy, MD;  Location: [facility] ORS;  Service: Urology;  Laterality: Bilateral;   CYSTOSCOPY WITH BIOPSY N/A 11/16/2021   Procedure: CYSTOSCOPY WITH BLADDER BIOPSY;  Surgeon: Hollice Espy, MD;  Location: [facility] ORS;  Service: Urology;  Laterality: N/A;   ESOPHAGOGASTRODUODENOSCOPY (EGD) WITH PROPOFOL N/A 10/28/2016   Procedure: ESOPHAGOGASTRODUODENOSCOPY (EGD) WITH PROPOFOL;  Surgeon: [name], MD;  Location: [facility] ENDOSCOPY;  Service: Endoscopy;  Laterality: N/A;   GIVENS CAPSULE STUDY N/A 07/25/2017   Procedure: GIVENS CAPSULE STUDY;  Surgeon: [name], MD;  Location: [facility] ENDOSCOPY;  Service: Gastroenterology;  Laterality: N/A;   GIVENS CAPSULE STUDY N/A 01/29/2019   Procedure: GIVENS CAPSULE STUDY;  Surgeon: [name], MD;  Location: [facility] ENDOSCOPY;  Service: Gastroenterology;  Laterality: N/A;   LAPAROSCOPIC RIGHT COLECTOMY Right 08/08/2015   Procedure: LAPAROSCOPIC RIGHT COLECTOMY;  Surgeon: Robert Bellow, MD;  Location: [facility] ORS;  Service: General;  Laterality: Right;   NASAL SINUS SURGERY     PACEMAKER INSERTION Left 08/26/2016   Procedure: INSERTION PACEMAKER;  Surgeon: Isaias Cowman, MD;  Location: [facility] ORS;  Service: Cardiovascular;  Laterality: Left;   PROSTATE BIOPSY  01/01/13, 01/30/14   Gleason 3+3=6, vol 46.6 cc   TONSILLECTOMY     uvula surgery     for sleep apnea    SOCIAL HISTORY: Social History   Socioeconomic History   Marital status: Divorced    Spouse name: Not on file   Number of children: Not on file   Years of education: Not on file   Highest education level: Not on file  Occupational History   Not on file  Tobacco Use   Smoking status: Every Day    Packs/day: 1.00    Years: 60.00    Total pack years: 60.00    Types: Cigarettes   Smokeless tobacco: Never   Tobacco comments:    DOWN TO 1/2 PPD  Vaping Use   Vaping Use: Never used  Substance and Sexual Activity   Alcohol use: Yes    Alcohol/week: 1.0 standard drink of alcohol    Types: 1 Cans of beer per week    Comment: occasionally   Drug use: No   Sexual activity: Not Currently  Other Topics Concern   Not on file  Social History Narrative   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  MEDICATIONS:  Current Outpatient Medications  Medication Sig Dispense Refill   albuterol (  PROVENTIL HFA;VENTOLIN HFA) 108 (90 Base) MCG/ACT inhaler Inhale 1 puff into the lungs every 6 (six) hours as needed.     allopurinol (ZYLOPRIM) 100 MG tablet Take 100 mg by mouth daily.     ALPRAZolam (XANAX) 0.25 MG tablet Take by  mouth 2 (two) times daily as needed.     apixaban (ELIQUIS) 2.5 MG TABS tablet Take 2.5 mg by mouth 2 (two) times daily.      atorvastatin (LIPITOR) 80 MG tablet Take 80 mg by mouth at bedtime.     benazepril (LOTENSIN) 10 MG tablet Take 10 mg by mouth daily.     Butalbital-APAP-Caffeine 50-300-40 MG CAPS Take 1 capsule by mouth every 4 (four) hours as needed.     Cholecalciferol 1.25 MG (50000 UT) capsule Take 50,000 Units by mouth once a week. wednesday     ezetimibe (ZETIA) 10 MG tablet Take 10 mg by mouth daily.     fluticasone (FLONASE) 50 MCG/ACT nasal spray Place 1 spray into both nostrils daily as needed for rhinitis.     fluticasone furoate-vilanterol (BREO ELLIPTA) 100-25 MCG/INH AEPB Inhale 1 puff into the lungs daily.      gabapentin (NEURONTIN) 400 MG capsule Take 400 mg by mouth 3 (three) times daily.     HUMIRA PEN 40 MG/0.4ML PNKT Inject 40 mg into the skin every 14 (fourteen) days.     isosorbide mononitrate (IMDUR) 60 MG 24 hr tablet Take 60 mg by mouth daily.     JARDIANCE 25 MG TABS tablet 25 mg daily.     pantoprazole (PROTONIX) 20 MG tablet Take 20 mg by mouth daily.     tamsulosin (FLOMAX) 0.4 MG CAPS capsule TAKE 1 CAPSULE BY MOUTH ONCE DAILY AFTER  SUPPER (Patient taking differently: 0.4 mg daily.) 90 capsule 3   traMADol (ULTRAM) 50 MG tablet Take 50 mg by mouth 2 (two) times daily as needed.     No current facility-administered medications for this visit.      Marland Kitchen  PHYSICAL EXAMINATION: ECOG PERFORMANCE STATUS: 1 - Symptomatic but completely ambulatory Vitals:   04/05/22 1329  BP: (!) 86/61  Pulse: 67  Resp: 18  Temp: (!) 97.2 F (36.2 C)   Filed Weights   04/05/22 1329  Weight: 194 lb 12.8 oz (88.4 kg)   Physical Exam Constitutional:      General: He is not in acute distress.    Appearance: He is not diaphoretic.  HENT:     Head: Normocephalic and atraumatic.     Nose: Nose normal.     Mouth/Throat:     Pharynx: No oropharyngeal exudate.  Eyes:      General: No scleral icterus.       Right eye: No discharge.        Left eye: No discharge.     Pupils: Pupils are equal, round, and reactive to light.  Neck:     Vascular: No JVD.  Cardiovascular:     Rate and Rhythm: Normal rate and regular rhythm.     Heart sounds: Normal heart sounds. No murmur heard.    No friction rub.  Pulmonary:     Effort: Pulmonary effort is normal. No respiratory distress.     Breath sounds: Normal breath sounds. No wheezing or rales.  Chest:     Chest wall: No tenderness.  Abdominal:     General: Bowel sounds are normal. There is no distension.     Palpations: Abdomen is soft. There is no mass.  Tenderness: There is no abdominal tenderness.  Musculoskeletal:        General: No tenderness. Normal range of motion.     Cervical back: Normal range of motion and neck supple.  Lymphadenopathy:     Cervical: No cervical adenopathy.  Skin:    General: Skin is warm and dry.     Findings: No erythema.     Comments: Hypopigmentation of skin  Neurological:     Mental Status: He is alert and oriented to person, place, and time.  Psychiatric:        Mood and Affect: Affect normal.     LABORATORY DATA:  I have reviewed the data as listed    Latest Ref Rng & Units 04/05/2022   12:51 PM 12/07/2021    1:14 PM 10/05/2021    2:11 PM  CBC  WBC 4.0 - 10.5 K/uL 8.6  8.5  9.8   Hemoglobin 13.0 - 17.0 g/dL 15.3  17.2  17.1   Hematocrit 39.0 - 52.0 % 47.5  52.6  53.4   Platelets 150 - 400 K/uL 238  232  242       Latest Ref Rng & Units 04/05/2022   12:51 PM 12/07/2021    1:14 PM 09/10/2021   11:29 PM  CMP  Glucose 70 - 99 mg/dL 99  88  89   BUN 8 - 23 mg/dL _0 Creatinine 0.61 - 1.24 mg/dL 1.30  1.45  1.56   Sodium 135 - 145 mmol/L 137  134  138   Potassium 3.5 - 5.1 mmol/L 4.0  4.1  4.3   Chloride 98 - 111 mmol/L 112  105  107   CO2 22 - 32 mmol/L _1 Calcium 8.9 - 10.3 mg/dL 8.7  8.6  9.1   Total Protein 6.5 - 8.1 g/dL 7.8  7.9     Total Bilirubin 0.3 - 1.2 mg/dL 0.4  0.6    Alkaline Phos 38 - 126 U/L 70  74    AST 15 - 41 U/L 16  17    ALT 0 - 44 U/L 13  19       Bone marrow biopsy 03/21/2017  Bone Marrow, Aspirate,Biopsy, and Clot, left iliac - HYPERCELLULAR BONE MARROW FOR AGE WITH TRILINEAGE HEMATOPOIESIS. - PLASMACYTOSIS (PLASMA CELLS 8%)  Karyotype: loss of Y chromosome Cytogenetic MDS FISH Panel negative.   Bone marrow 04/06/2017  Bone Marrow, Aspirate,Biopsy, and Clot, right iliac and core - HYPERCELLULAR BONE MARROW FOR AGE WITH PLASMA CELL NEOPLASM - TRILINEAGE HEMATOPOIESIS. - SEE COMMENT. PERIPHERAL BLOOD: - MICROCYTIC-HYPOCHROMIC ANEMIA. Diagnosis Note The bone marrow is hypercellular with trilineage hematopoiesis but with relative abundance of erythroid precursors and increased number of megakaryocytes with nonspecific changes. Significant dyspoiesis is not seen. Iron stores are present with no ring sideroblasts. The plasma cells are increased in number representing 8% of all cells in the aspirate although focal areas in the core biopsy show 10 to 20% as primarily seen by CD138 stain. In situ hybridization for kappa and lambda light chains show kappa light chain restriction consistent with plasma cell neoplasm. Correlation with cytogenetic and FISH studies is recommended. (BNS:ecj/gt 04/07/2017)  IMAGE STUDIES I have personally reviewed below image results.  03/15/2017 DG bone survey Met: Nonspecific lucent lesion in the distal right ulna measuring 15-16 mm, but otherwise normal bone mineralization for age throughout the visible skeleton. A solitary lytic lesion in a distal extremity would be an  unusual presentation of multiple myeloma, and I favor the distal right ulna lesion is benign. Recommend correlation with serum and urine protein electrophoresis.  04/14/2017 PET scan: 1. 2.1 cm hypermetabolic soft tissue lesion in the left heel, adjacent to the calcaneal tuberosity. Plasmacytoma at this  location a concern. 2. Mottled FDG accumulation diffusely in the marrow space without other frankly overt hypermetabolic bony lesion. 3. Coronary artery and thoracoabdominal aortic atherosclerosis with abdominal aortic stent graft visualized in situ. 4. Bilateral renal cysts and bilateral nonobstructing renal stones.   08/24/2017 PET scan 1. No hypermetabolic osseous lesions. No definite signs of multiple myeloma on CT imaging. 2. Interval improvement in soft tissue activity along the medial aspect of the left calcaneal tuberosity, likely plantar fasciitis. 3. New small left pleural effusion. 4. Bilateral inguinal hernias containing small bowel on the right and sigmoid colon on the left. No evidence of incarceration or obstruction. 5. Otherwise stable incidental findings including diffuse atherosclerosis, bilateral renal cysts, emphysema and sigmoid diverticulosis.

## 2022-04-05 NOTE — Assessment & Plan Note (Addendum)
Normal TSH CT renal protocol in April 2023 reviewed  Check CT chest wo contrast.

## 2022-04-06 DIAGNOSIS — M150 Primary generalized (osteo)arthritis: Secondary | ICD-10-CM | POA: Diagnosis not present

## 2022-04-06 DIAGNOSIS — E119 Type 2 diabetes mellitus without complications: Secondary | ICD-10-CM | POA: Diagnosis not present

## 2022-04-06 DIAGNOSIS — J449 Chronic obstructive pulmonary disease, unspecified: Secondary | ICD-10-CM | POA: Diagnosis not present

## 2022-04-06 DIAGNOSIS — M25552 Pain in left hip: Secondary | ICD-10-CM | POA: Diagnosis not present

## 2022-04-06 DIAGNOSIS — N181 Chronic kidney disease, stage 1: Secondary | ICD-10-CM | POA: Diagnosis not present

## 2022-04-06 DIAGNOSIS — F17200 Nicotine dependence, unspecified, uncomplicated: Secondary | ICD-10-CM | POA: Diagnosis not present

## 2022-04-06 DIAGNOSIS — I10 Essential (primary) hypertension: Secondary | ICD-10-CM | POA: Diagnosis not present

## 2022-04-06 DIAGNOSIS — F411 Generalized anxiety disorder: Secondary | ICD-10-CM | POA: Diagnosis not present

## 2022-04-06 LAB — KAPPA/LAMBDA LIGHT CHAINS
Kappa free light chain: 249 mg/L — ABNORMAL HIGH (ref 3.3–19.4)
Kappa, lambda light chain ratio: 6.19 — ABNORMAL HIGH (ref 0.26–1.65)
Lambda free light chains: 40.2 mg/L — ABNORMAL HIGH (ref 5.7–26.3)

## 2022-04-09 LAB — MULTIPLE MYELOMA PANEL, SERUM
Albumin SerPl Elph-Mcnc: 3.1 g/dL (ref 2.9–4.4)
Albumin/Glob SerPl: 0.9 (ref 0.7–1.7)
Alpha 1: 0.3 g/dL (ref 0.0–0.4)
Alpha2 Glob SerPl Elph-Mcnc: 0.8 g/dL (ref 0.4–1.0)
B-Globulin SerPl Elph-Mcnc: 0.9 g/dL (ref 0.7–1.3)
Gamma Glob SerPl Elph-Mcnc: 1.8 g/dL (ref 0.4–1.8)
Globulin, Total: 3.8 g/dL (ref 2.2–3.9)
IgA: 294 mg/dL (ref 61–437)
IgG (Immunoglobin G), Serum: 2014 mg/dL — ABNORMAL HIGH (ref 603–1613)
IgM (Immunoglobulin M), Srm: 156 mg/dL — ABNORMAL HIGH (ref 15–143)
Total Protein ELP: 6.9 g/dL (ref 6.0–8.5)

## 2022-04-13 ENCOUNTER — Telehealth: Payer: Self-pay

## 2022-04-13 NOTE — Telephone Encounter (Signed)
-----   Message from Earlie Server, MD sent at 04/11/2022 10:37 AM EST ----- Please arrange him to do a 24 hour UPEP thanks.

## 2022-04-13 NOTE — Telephone Encounter (Signed)
Pt informed and will pick up urine jug tomorrow.

## 2022-04-16 ENCOUNTER — Other Ambulatory Visit: Payer: Self-pay

## 2022-04-16 ENCOUNTER — Inpatient Hospital Stay: Payer: Medicare PPO | Attending: Oncology

## 2022-04-16 DIAGNOSIS — D751 Secondary polycythemia: Secondary | ICD-10-CM | POA: Insufficient documentation

## 2022-04-16 DIAGNOSIS — E611 Iron deficiency: Secondary | ICD-10-CM

## 2022-04-16 DIAGNOSIS — D472 Monoclonal gammopathy: Secondary | ICD-10-CM | POA: Diagnosis not present

## 2022-04-16 DIAGNOSIS — Z8546 Personal history of malignant neoplasm of prostate: Secondary | ICD-10-CM

## 2022-04-19 LAB — IFE+PROTEIN ELECTRO, 24-HR UR
% BETA, Urine: 42.4 %
ALPHA 1 URINE: 7 %
Albumin, U: 31 %
Alpha 2, Urine: 8.4 %
GAMMA GLOBULIN URINE: 11.2 %
M-SPIKE %, Urine: 22.3 % — ABNORMAL HIGH
M-Spike, Mg/24 Hr: 27 mg/24 hr — ABNORMAL HIGH
Total Protein, Urine-Ur/day: 122 mg/24 hr (ref 30–150)
Total Protein, Urine: 9.4 mg/dL
Total Volume: 1300

## 2022-04-21 DIAGNOSIS — M0579 Rheumatoid arthritis with rheumatoid factor of multiple sites without organ or systems involvement: Secondary | ICD-10-CM | POA: Diagnosis not present

## 2022-04-21 DIAGNOSIS — Z720 Tobacco use: Secondary | ICD-10-CM | POA: Diagnosis not present

## 2022-04-21 DIAGNOSIS — Z79899 Other long term (current) drug therapy: Secondary | ICD-10-CM | POA: Diagnosis not present

## 2022-04-21 DIAGNOSIS — C9001 Multiple myeloma in remission: Secondary | ICD-10-CM | POA: Diagnosis not present

## 2022-04-23 DIAGNOSIS — I7389 Other specified peripheral vascular diseases: Secondary | ICD-10-CM | POA: Diagnosis not present

## 2022-04-23 DIAGNOSIS — F17200 Nicotine dependence, unspecified, uncomplicated: Secondary | ICD-10-CM | POA: Diagnosis not present

## 2022-04-23 DIAGNOSIS — I4891 Unspecified atrial fibrillation: Secondary | ICD-10-CM | POA: Diagnosis not present

## 2022-04-23 DIAGNOSIS — K219 Gastro-esophageal reflux disease without esophagitis: Secondary | ICD-10-CM | POA: Diagnosis not present

## 2022-04-23 DIAGNOSIS — I351 Nonrheumatic aortic (valve) insufficiency: Secondary | ICD-10-CM | POA: Diagnosis not present

## 2022-04-23 DIAGNOSIS — I340 Nonrheumatic mitral (valve) insufficiency: Secondary | ICD-10-CM | POA: Diagnosis not present

## 2022-04-23 DIAGNOSIS — R0602 Shortness of breath: Secondary | ICD-10-CM | POA: Diagnosis not present

## 2022-04-23 DIAGNOSIS — I10 Essential (primary) hypertension: Secondary | ICD-10-CM | POA: Diagnosis not present

## 2022-04-23 DIAGNOSIS — E782 Mixed hyperlipidemia: Secondary | ICD-10-CM | POA: Diagnosis not present

## 2022-05-04 DIAGNOSIS — R0602 Shortness of breath: Secondary | ICD-10-CM | POA: Diagnosis not present

## 2022-05-06 DIAGNOSIS — R0602 Shortness of breath: Secondary | ICD-10-CM | POA: Diagnosis not present

## 2022-05-11 DIAGNOSIS — I340 Nonrheumatic mitral (valve) insufficiency: Secondary | ICD-10-CM | POA: Diagnosis not present

## 2022-05-11 DIAGNOSIS — K219 Gastro-esophageal reflux disease without esophagitis: Secondary | ICD-10-CM | POA: Diagnosis not present

## 2022-05-11 DIAGNOSIS — I351 Nonrheumatic aortic (valve) insufficiency: Secondary | ICD-10-CM | POA: Diagnosis not present

## 2022-05-11 DIAGNOSIS — I4891 Unspecified atrial fibrillation: Secondary | ICD-10-CM | POA: Diagnosis not present

## 2022-05-11 DIAGNOSIS — E782 Mixed hyperlipidemia: Secondary | ICD-10-CM | POA: Diagnosis not present

## 2022-05-11 DIAGNOSIS — I7389 Other specified peripheral vascular diseases: Secondary | ICD-10-CM | POA: Diagnosis not present

## 2022-05-11 DIAGNOSIS — I10 Essential (primary) hypertension: Secondary | ICD-10-CM | POA: Diagnosis not present

## 2022-05-11 DIAGNOSIS — G4733 Obstructive sleep apnea (adult) (pediatric): Secondary | ICD-10-CM | POA: Diagnosis not present

## 2022-05-11 DIAGNOSIS — F17210 Nicotine dependence, cigarettes, uncomplicated: Secondary | ICD-10-CM | POA: Diagnosis not present

## 2022-06-24 ENCOUNTER — Encounter: Payer: Self-pay | Admitting: Podiatry

## 2022-06-24 ENCOUNTER — Ambulatory Visit (INDEPENDENT_AMBULATORY_CARE_PROVIDER_SITE_OTHER): Payer: Medicare PPO | Admitting: Podiatry

## 2022-06-24 VITALS — BP 144/88 | Temp 78.0°F

## 2022-06-24 DIAGNOSIS — B351 Tinea unguium: Secondary | ICD-10-CM | POA: Diagnosis not present

## 2022-06-24 DIAGNOSIS — D689 Coagulation defect, unspecified: Secondary | ICD-10-CM

## 2022-06-24 DIAGNOSIS — M79609 Pain in unspecified limb: Secondary | ICD-10-CM | POA: Diagnosis not present

## 2022-06-24 DIAGNOSIS — E1140 Type 2 diabetes mellitus with diabetic neuropathy, unspecified: Secondary | ICD-10-CM

## 2022-06-24 DIAGNOSIS — I739 Peripheral vascular disease, unspecified: Secondary | ICD-10-CM

## 2022-06-24 NOTE — Progress Notes (Signed)
This patient returns to my office for at risk foot care.  This patient requires this care by a professional since this patient will be at risk due to having PAD and diabetes. And coagulation defect.  Patient is taking eliquiss.  This patient is unable to cut nails himself since the patient cannot reach his nails.These nails are painful walking and wearing shoes.  This patient presents for at risk foot care today.  Patient has self inflicted cut along the medial border right big toe.  General Appearance  Alert, conversant and in no acute stress.  Vascular  Dorsalis pedis and posterior tibial  pulses are not  palpable  bilaterally.  Capillary return is within normal limits  bilaterally. Temperature is within normal limits  bilaterally.  Neurologic  Senn-Weinstein monofilament wire test within normal limits  bilaterally. Muscle power within normal limits bilaterally.  Nails Thick disfigured discolored nails with subungual debris  from hallux to fifth toes bilaterally. No evidence of bacterial infection or drainage bilaterally.  Orthopedic  No limitations of motion  feet .  No crepitus or effusions noted.  No bony pathology or digital deformities noted.  HAV wuth hammer toe  B/L.  Rigid contracted second digit right foot.  Skin  normotropic skin with no porokeratosis noted bilaterally.  No signs of infections or ulcers noted.     Onychomycosis  Pain in right toes  Pain in left toes  Consent was obtained for treatment procedures.   Mechanical debridement of nails 1-5  bilaterally performed with a nail nipper.  Filed with dremel without incident. Self inflicted cut is healing.     Return office visit    10 weeks                 Told patient to return for periodic foot care and evaluation due to potential at risk complications.   Gardiner Barefoot DPM

## 2022-06-25 DIAGNOSIS — M4807 Spinal stenosis, lumbosacral region: Secondary | ICD-10-CM | POA: Diagnosis not present

## 2022-06-25 DIAGNOSIS — M5136 Other intervertebral disc degeneration, lumbar region: Secondary | ICD-10-CM | POA: Diagnosis not present

## 2022-06-28 DIAGNOSIS — E119 Type 2 diabetes mellitus without complications: Secondary | ICD-10-CM | POA: Diagnosis not present

## 2022-06-28 DIAGNOSIS — I10 Essential (primary) hypertension: Secondary | ICD-10-CM | POA: Diagnosis not present

## 2022-06-29 ENCOUNTER — Other Ambulatory Visit (INDEPENDENT_AMBULATORY_CARE_PROVIDER_SITE_OTHER): Payer: Self-pay | Admitting: Nurse Practitioner

## 2022-06-29 DIAGNOSIS — I714 Abdominal aortic aneurysm, without rupture, unspecified: Secondary | ICD-10-CM

## 2022-06-30 DIAGNOSIS — J984 Other disorders of lung: Secondary | ICD-10-CM | POA: Diagnosis not present

## 2022-06-30 DIAGNOSIS — N181 Chronic kidney disease, stage 1: Secondary | ICD-10-CM | POA: Diagnosis not present

## 2022-06-30 DIAGNOSIS — R0602 Shortness of breath: Secondary | ICD-10-CM | POA: Diagnosis not present

## 2022-06-30 DIAGNOSIS — F17210 Nicotine dependence, cigarettes, uncomplicated: Secondary | ICD-10-CM | POA: Diagnosis not present

## 2022-06-30 DIAGNOSIS — E119 Type 2 diabetes mellitus without complications: Secondary | ICD-10-CM | POA: Diagnosis not present

## 2022-06-30 DIAGNOSIS — J90 Pleural effusion, not elsewhere classified: Secondary | ICD-10-CM | POA: Diagnosis not present

## 2022-06-30 DIAGNOSIS — J449 Chronic obstructive pulmonary disease, unspecified: Secondary | ICD-10-CM | POA: Diagnosis not present

## 2022-06-30 DIAGNOSIS — F411 Generalized anxiety disorder: Secondary | ICD-10-CM | POA: Diagnosis not present

## 2022-06-30 DIAGNOSIS — M150 Primary generalized (osteo)arthritis: Secondary | ICD-10-CM | POA: Diagnosis not present

## 2022-06-30 DIAGNOSIS — R918 Other nonspecific abnormal finding of lung field: Secondary | ICD-10-CM | POA: Diagnosis not present

## 2022-06-30 DIAGNOSIS — I10 Essential (primary) hypertension: Secondary | ICD-10-CM | POA: Diagnosis not present

## 2022-06-30 DIAGNOSIS — S39012A Strain of muscle, fascia and tendon of lower back, initial encounter: Secondary | ICD-10-CM | POA: Diagnosis not present

## 2022-06-30 DIAGNOSIS — M109 Gout, unspecified: Secondary | ICD-10-CM | POA: Diagnosis not present

## 2022-07-02 ENCOUNTER — Encounter (INDEPENDENT_AMBULATORY_CARE_PROVIDER_SITE_OTHER): Payer: Self-pay | Admitting: Vascular Surgery

## 2022-07-02 ENCOUNTER — Ambulatory Visit (INDEPENDENT_AMBULATORY_CARE_PROVIDER_SITE_OTHER): Payer: Medicare PPO | Admitting: Vascular Surgery

## 2022-07-02 ENCOUNTER — Ambulatory Visit (INDEPENDENT_AMBULATORY_CARE_PROVIDER_SITE_OTHER): Payer: Medicare PPO

## 2022-07-02 VITALS — BP 75/49 | HR 80 | Resp 16 | Ht 71.0 in | Wt 196.0 lb

## 2022-07-02 DIAGNOSIS — I7143 Infrarenal abdominal aortic aneurysm, without rupture: Secondary | ICD-10-CM | POA: Diagnosis not present

## 2022-07-02 DIAGNOSIS — E1140 Type 2 diabetes mellitus with diabetic neuropathy, unspecified: Secondary | ICD-10-CM | POA: Diagnosis not present

## 2022-07-02 DIAGNOSIS — I714 Abdominal aortic aneurysm, without rupture, unspecified: Secondary | ICD-10-CM | POA: Diagnosis not present

## 2022-07-02 DIAGNOSIS — I10 Essential (primary) hypertension: Secondary | ICD-10-CM | POA: Diagnosis not present

## 2022-07-02 NOTE — Assessment & Plan Note (Signed)
His duplex today shows a patent stent graft without endoleak and a maximal sac diameter or 3.6 cm which is down from 3.9 cm previously. Doing well. Continue annual follow up.  No changes to medical regimen.

## 2022-07-02 NOTE — Progress Notes (Signed)
MRN : [id number]  MICHAELPAUL APO is a 82 y.o. ([birth year]) male who presents with chief complaint of  Chief Complaint  Patient presents with   Follow-up    ultrasound    History of Present Illness: Patient returns today in follow up of his AAA.  He is several years s/p endovascular AAA repair.  He is doing well without aneurysm related symptoms. Specifically, the patient denies new back or abdominal pain, or signs of peripheral embolization. His duplex today shows a patent stent graft without endoleak and a maximal sac diameter or 3.6 cm which is down from 3.9 cm previously.  Current Outpatient Medications  Medication Sig Dispense Refill   albuterol (PROVENTIL HFA;VENTOLIN HFA) 108 (90 Base) MCG/ACT inhaler Inhale 1 puff into the lungs every 6 (six) hours as needed.     allopurinol (ZYLOPRIM) 100 MG tablet Take 100 mg by mouth daily.     ALPRAZolam (XANAX) 0.25 MG tablet Take by mouth 2 (two) times daily as needed.     apixaban (ELIQUIS) 2.5 MG TABS tablet Take 2.5 mg by mouth 2 (two) times daily.      atorvastatin (LIPITOR) 80 MG tablet Take 80 mg by mouth at bedtime.     benazepril (LOTENSIN) 10 MG tablet Take 10 mg by mouth daily.     Butalbital-APAP-Caffeine 50-300-40 MG CAPS Take 1 capsule by mouth every 4 (four) hours as needed.     Cholecalciferol 1.25 MG (50000 UT) capsule Take 50,000 Units by mouth once a week. wednesday     ezetimibe (ZETIA) 10 MG tablet Take 10 mg by mouth daily.     fluticasone (FLONASE) 50 MCG/ACT nasal spray Place 1 spray into both nostrils daily as needed for rhinitis.     fluticasone furoate-vilanterol (BREO ELLIPTA) 100-25 MCG/INH AEPB Inhale 1 puff into the lungs daily.      gabapentin (NEURONTIN) 400 MG capsule Take 400 mg by mouth 3 (three) times daily.     HUMIRA PEN 40 MG/0.4ML PNKT Inject 40 mg into the skin every 14 (fourteen) days.     isosorbide mononitrate (IMDUR) 60 MG 24 hr tablet Take 60 mg by mouth daily.     JARDIANCE 25 MG TABS tablet  25 mg daily.     pantoprazole (PROTONIX) 20 MG tablet Take 20 mg by mouth daily.     tamsulosin (FLOMAX) 0.4 MG CAPS capsule TAKE 1 CAPSULE BY MOUTH ONCE DAILY AFTER  SUPPER (Patient taking differently: 0.4 mg daily.) 90 capsule 3   traMADol (ULTRAM) 50 MG tablet Take 50 mg by mouth 2 (two) times daily as needed.     No current facility-administered medications for this visit.    Past Medical History:  Diagnosis Date   AAA (abdominal aortic aneurysm) ([place])    a.) s/p EVAR 11/05/2013.   Anxiety    a.) on BZO (alprazolam) PRN   Aortic atherosclerosis (HCC)    Atrial fibrillation (HCC)    a.) CHA2DS2-VASc = 6 (age x2, CHF, HTN, PVD/aortic plaque, T2DM). b.) rate/rhythm maintained without use of pharmacological intervention; chronically anticoagulated using dose reduced apixaban.   BPH (benign prostatic hyperplasia)    CHF (congestive heart failure) ([place])    a.)  TTE 03/16/2021: EF 63%; moderate LVH; mild BAE mild to moderate pan valvular regurgitation; G1DD.   CKD (chronic kidney disease), stage III (HCC)    Colon polyp 07/07/2015   TUBULAR ADENOMA WITH AT LEAST HIGH-GRADE / Dr Rayann Heman   COPD (chronic obstructive pulmonary disease) ([place])  Coronary artery disease    a.) coronary CTA 01/10/2015 CA score 722.3; mod-sev LAD disease; mild LCx disease; 50-60% RCA disease.   Current use of long term anticoagulation    a.) dose reduced apixaban   Diverticulosis    Erectile dysfunction    GERD (gastroesophageal reflux disease)    Gout    Heart murmur    Hepatic cyst    Hepatic steatosis    Hepatitis    Hypercholesterolemia    Hypertension    Iron deficiency anemia    Migraines    Multiple myeloma (HCC)    Nephrolithiasis    OSA on CPAP    Presence of permanent cardiac pacemaker    Prostate cancer ([place]) 01/01/13, 01/30/14   Gleason 3+4=7, volume 46.6 cc   PVD (peripheral vascular disease) ([place])    Renal cyst    Rheumatoid arthritis ([place])    S/P radiation therapy  04/03/2014 through  06/04/2014                                                      Prostate 7800 cGy in 40 sessions                           SSS (sick sinus syndrome) ([place])    a.) s/p Medtronic Azure PPM placement 08/26/2016   T2DM (type 2 diabetes mellitus) ([place])    Third degree heart block ([place]) 08/26/2016   a.) s/p PPM placement (Medtronic Azure XT DR MRI [id number] S/N: [id number]) on 08/26/2016   Vitamin D deficiency     Past Surgical History:  Procedure Laterality Date   ABDOMINAL AORTIC ANEURYSM REPAIR  11/05/2013   COLON SURGERY  08/06/2015   Right hemicolectomy for tubulovillous adenoma with high-grade dysplasia.   COLONOSCOPY WITH PROPOFOL N/A 07/07/2015   Procedure: COLONOSCOPY WITH PROPOFOL;  Surgeon: Josefine Class, MD;  Location: [facility] ENDOSCOPY;  Service: Endoscopy;  Laterality: N/A;   COLONOSCOPY WITH PROPOFOL N/A 10/28/2016   Procedure: COLONOSCOPY WITH PROPOFOL;  Surgeon: [name], MD;  Location: [facility] ENDOSCOPY;  Service: Endoscopy;  Laterality: N/A;   CYSTOSCOPY W/ RETROGRADES Bilateral 11/16/2021   Procedure: CYSTOSCOPY WITH RETROGRADE PYELOGRAM;  Surgeon: Hollice Espy, MD;  Location: [facility] ORS;  Service: Urology;  Laterality: Bilateral;   CYSTOSCOPY WITH BIOPSY N/A 11/16/2021   Procedure: CYSTOSCOPY WITH BLADDER BIOPSY;  Surgeon: Hollice Espy, MD;  Location: [facility] ORS;  Service: Urology;  Laterality: N/A;   ESOPHAGOGASTRODUODENOSCOPY (EGD) WITH PROPOFOL N/A 10/28/2016   Procedure: ESOPHAGOGASTRODUODENOSCOPY (EGD) WITH PROPOFOL;  Surgeon: [name], MD;  Location: [facility] ENDOSCOPY;  Service: Endoscopy;  Laterality: N/A;   GIVENS CAPSULE STUDY N/A 07/25/2017   Procedure: GIVENS CAPSULE STUDY;  Surgeon: [name], MD;  Location: [facility] ENDOSCOPY;  Service: Gastroenterology;  Laterality: N/A;   GIVENS CAPSULE STUDY N/A 01/29/2019   Procedure: GIVENS CAPSULE STUDY;  Surgeon: [name], MD;  Location: [facility] ENDOSCOPY;  Service: Gastroenterology;  Laterality: N/A;   LAPAROSCOPIC RIGHT  COLECTOMY Right 08/08/2015   Procedure: LAPAROSCOPIC RIGHT COLECTOMY;  Surgeon: Robert Bellow, MD;  Location: [facility] ORS;  Service: General;  Laterality: Right;   NASAL SINUS SURGERY     PACEMAKER INSERTION Left 08/26/2016   Procedure: INSERTION PACEMAKER;  Surgeon: Isaias Cowman, MD;  Location: [facility] ORS;  Service: Cardiovascular;  Laterality: Left;  PROSTATE BIOPSY  01/01/13, 01/30/14   Gleason 3+3=6, vol 46.6 cc   TONSILLECTOMY     uvula surgery     for sleep apnea     Social History   Tobacco Use   Smoking status: Every Day    Packs/day: 1.00    Years: 60.00    Total pack years: 60.00    Types: Cigarettes   Smokeless tobacco: Never   Tobacco comments:    DOWN TO 1/2 PPD  Vaping Use   Vaping Use: Never used  Substance Use Topics   Alcohol use: Yes    Alcohol/week: 1.0 standard drink of alcohol    Types: 1 Cans of beer per week    Comment: occasionally   Drug use: No      Family History  Problem Relation Age of Onset   Heart attack Mother    Cirrhosis Father    Pancreatic cancer Brother    Diabetes Brother    Diabetes Daughter        medication induced for cancer treatments   Cancer Daughter        breast, brain     No Known Allergies   REVIEW OF SYSTEMS (Negative unless checked)   Constitutional: '[]'$ Weight loss  '[]'$ Fever  '[]'$ Chills Cardiac: '[]'$ Chest pain   '[]'$ Chest pressure   '[]'$ Palpitations   '[]'$ Shortness of breath when laying flat   '[]'$ Shortness of breath at rest   '[x]'$ Shortness of breath with exertion. Vascular:  '[x]'$ Pain in legs with walking   '[]'$ Pain in legs at rest   '[]'$ Pain in legs when laying flat   '[]'$ Claudication   '[]'$ Pain in feet when walking  '[]'$ Pain in feet at rest  '[]'$ Pain in feet when laying flat   '[]'$ History of DVT   '[]'$ Phlebitis   '[x]'$ Swelling in legs   '[]'$ Varicose veins   '[]'$ Non-healing ulcers Pulmonary:   '[]'$ Uses home oxygen   '[]'$ Productive cough   '[]'$ Hemoptysis   '[]'$ Wheeze  '[]'$ COPD   '[]'$ Asthma Neurologic:  '[]'$ Dizziness  '[]'$ Blackouts   '[]'$ Seizures   '[]'$ History  of stroke   '[]'$ History of TIA  '[]'$ Aphasia   '[]'$ Temporary blindness   '[]'$ Dysphagia   '[]'$ Weakness or numbness in arms   '[]'$ Weakness or numbness in legs Musculoskeletal:  '[]'$ Arthritis   '[]'$ Joint swelling   '[]'$ Joint pain   '[]'$ Low back pain Hematologic:  '[]'$ Easy bruising  '[]'$ Easy bleeding   '[]'$ Hypercoagulable state   '[]'$ Anemic   Gastrointestinal:  '[x]'$ Blood in stool   '[]'$ Vomiting blood  '[]'$ Gastroesophageal reflux/heartburn   '[x]'$ Abdominal pain Genitourinary:  '[]'$ Chronic kidney disease   '[]'$ Difficult urination  '[]'$ Frequent urination  '[]'$ Burning with urination   '[]'$ Hematuria Skin:  '[]'$ Rashes   '[]'$ Ulcers   '[]'$ Wounds Psychological:  '[]'$ History of anxiety   '[]'$  History of major depression.  Physical Examination  BP (!) 75/49 (BP Location: Right Arm)   Pulse 80   Resp 16   Ht '5\' 11"'$  (1.803 m)   Wt 196 lb (88.9 kg)   BMI 27.34 kg/m  Gen:  WD/WN, NAD Head: [place]/AT, No temporalis wasting. Ear/Nose/Throat: Hearing grossly intact, nares w/o erythema or drainage Eyes: Conjunctiva clear. Sclera non-icteric Neck: Supple.  Trachea midline Pulmonary:  Good air movement, no use of accessory muscles.  Cardiac: RRR, no JVD Vascular:  Vessel Right Left  Radial Palpable Palpable       Musculoskeletal: M/S 5/5 throughout.  No deformity or atrophy. No edema. Neurologic: Sensation grossly intact in extremities.  Symmetrical.  Speech is fluent.  Psychiatric: Judgment intact, Mood & affect appropriate for pt's clinical situation. Dermatologic: No  rashes or ulcers noted.  No cellulitis or open wounds.      Labs Recent Results (from the past 2160 hour(s))  Kappa/lambda light chains     Status: Abnormal   Collection Time: 04/05/22 12:51 PM  Result Value Ref Range   Kappa free light chain 249.0 (H) 3.3 - 19.4 mg/L   Lambda free light chains 40.2 (H) 5.7 - 26.3 mg/L   Kappa, lambda light chain ratio 6.19 (H) 0.26 - 1.65    Comment: (NOTE) Performed At: [facility] [place], [place] [id number] Rush Farmer MD  [id number]   Multiple Myeloma Panel (SPEP&IFE w/QIG)     Status: Abnormal   Collection Time: 04/05/22 12:51 PM  Result Value Ref Range   IgG (Immunoglobin G), Serum 2,014 (H) 603 - 1,613 mg/dL   IgA 294 61 - 437 mg/dL   IgM (Immunoglobulin M), Srm 156 (H) 15 - 143 mg/dL   Total Protein ELP 6.9 6.0 - 8.5 g/dL   Albumin SerPl Elph-Mcnc 3.1 2.9 - 4.4 g/dL   Alpha 1 0.3 0.0 - 0.4 g/dL   Alpha2 Glob SerPl Elph-Mcnc 0.8 0.4 - 1.0 g/dL   B-Globulin SerPl Elph-Mcnc 0.9 0.7 - 1.3 g/dL   Gamma Glob SerPl Elph-Mcnc 1.8 0.4 - 1.8 g/dL   M Protein SerPl Elph-Mcnc Not Observed Not Observed g/dL   Globulin, Total 3.8 2.2 - 3.9 g/dL   Albumin/Glob SerPl 0.9 0.7 - 1.7   IFE 1 Comment (A)     Comment: Polyclonal increase detected in one or more immunoglobulins.   Please Note Comment     Comment: (NOTE) Protein electrophoresis scan will follow via computer, mail, or courier delivery. Performed At: [facility] [place], [place] [id number] Rush Farmer MD [id number]   PSA     Status: None   Collection Time: 04/05/22 12:51 PM  Result Value Ref Range   Prostatic Specific Antigen 0.11 0.00 - 4.00 ng/mL    Comment: (NOTE) While PSA levels of <=4.00 ng/ml are reported as reference range, some men with levels below 4.00 ng/ml can have prostate cancer and many men with PSA above 4.00 ng/ml do not have prostate cancer.  Other tests such as free PSA, age specific reference ranges, PSA velocity and PSA doubling time may be helpful especially in men less than 49 years old. Performed at [facility], [place] [address], [place], [place] [address]   Comprehensive metabolic panel     Status: Abnormal   Collection Time: 04/05/22 12:51 PM  Result Value Ref Range   Sodium 137 135 - 145 mmol/L   Potassium 4.0 3.5 - 5.1 mmol/L   Chloride 112 (H) 98 - 111 mmol/L   CO2 21 (L) 22 - 32 mmol/L   Glucose, Bld 99 70 - 99 mg/dL    Comment: Glucose reference range applies only to  samples taken after fasting for at least 8 hours.   BUN 20 8 - 23 mg/dL   Creatinine, Ser 1.30 (H) 0.61 - 1.24 mg/dL   Calcium 8.7 (L) 8.9 - 10.3 mg/dL   Total Protein 7.8 6.5 - 8.1 g/dL   Albumin 3.3 (L) 3.5 - 5.0 g/dL   AST 16 15 - 41 U/L   ALT 13 0 - 44 U/L   Alkaline Phosphatase 70 38 - 126 U/L   Total Bilirubin 0.4 0.3 - 1.2 mg/dL   GFR, Estimated 56 (L) >60 mL/min    Comment: (NOTE) Calculated using the CKD-EPI Creatinine Equation (  2021)    Anion gap 4 (L) 5 - 15    Comment: Performed at [facility], [address] [place], [place] [address]  CBC with Differential/Platelet     Status: Abnormal   Collection Time: 04/05/22 12:51 PM  Result Value Ref Range   WBC 8.6 4.0 - 10.5 K/uL   RBC 5.34 4.22 - 5.81 MIL/uL   Hemoglobin 15.3 13.0 - 17.0 g/dL   HCT 47.5 39.0 - 52.0 %   MCV 89.0 80.0 - 100.0 fL   MCH 28.7 26.0 - 34.0 pg   MCHC 32.2 30.0 - 36.0 g/dL   RDW 16.5 (H) 11.5 - 15.5 %   Platelets 238 150 - 400 K/uL   nRBC 0.0 0.0 - 0.2 %   Neutrophils Relative % 75 %   Neutro Abs 6.5 1.7 - 7.7 K/uL   Lymphocytes Relative 14 %   Lymphs Abs 1.2 0.7 - 4.0 K/uL   Monocytes Relative 7 %   Monocytes Absolute 0.6 0.1 - 1.0 K/uL   Eosinophils Relative 4 %   Eosinophils Absolute 0.3 0.0 - 0.5 K/uL   Basophils Relative 0 %   Basophils Absolute 0.0 0.0 - 0.1 K/uL   Immature Granulocytes 0 %   Abs Immature Granulocytes 0.03 0.00 - 0.07 K/uL    Comment: Performed at [facility], [address] [place], [place] [address]  TSH     Status: None   Collection Time: 04/05/22 12:51 PM  Result Value Ref Range   TSH 1.187 0.350 - 4.500 uIU/mL    Comment: Performed by a 3rd Generation assay with a functional sensitivity of <=0.01 uIU/mL. Performed at [facility], [address] [place], [place] [address]   Frederik Schmidt, [place] [facility]     Status: Abnormal   Collection Time: 04/16/22 10:57 AM  Result Value Ref Range   Total Protein, Urine 9.4 Not Estab. mg/dL    Total Protein, Urine-Ur/day 122 30 - 150 mg/24 hr   Albumin, U 31.0 %   ALPHA 1 URINE 7.0 %   Alpha 2, Urine 8.4 %   % BETA, Urine 42.4 %   GAMMA GLOBULIN URINE 11.2 %   M-SPIKE %, Urine 22.3 (H) Not Observed %   M-Spike, Mg/24 Hr 27 (H) Not Observed mg/24 hr   Immunofixation Result, Urine Comment (A)     Comment: Bence Jones Protein positive; kappa type.   Note: Comment     Comment: (NOTE) Protein electrophoresis scan will follow via computer, mail, or courier delivery. Performed At: [facility] [place], [place] [id number] Rush Farmer MD [id number]    Total Volume 1,300     Comment: Performed at [facility], [address], [place],  [address]    Radiology No results found.  Assessment/Plan BP (high blood pressure) blood pressure control important in reducing the progression of atherosclerotic disease and aneurysmal growth. On appropriate oral medications.     HLD (hyperlipidemia) lipid control important in reducing the progression of atherosclerotic disease. Continue statin therapy  Diabetes mellitus ([place]) blood glucose control important in reducing the progression of atherosclerotic disease. Also, involved in wound healing. On appropriate medications.   AAA (abdominal aortic aneurysm) without rupture (HCC) His duplex today shows a patent stent graft without endoleak and a maximal sac diameter or 3.6 cm which is down from 3.9 cm previously. Doing well. Continue annual follow up.  No changes to medical regimen.     Leotis Pain, MD  07/02/2022  10:41 AM    This note was created with Dragon medical transcription system.  Any errors from dictation are purely unintentional

## 2022-07-02 NOTE — Assessment & Plan Note (Signed)
blood glucose control important in reducing the progression of atherosclerotic disease. Also, involved in wound healing. On appropriate medications.  

## 2022-07-05 ENCOUNTER — Ambulatory Visit (INDEPENDENT_AMBULATORY_CARE_PROVIDER_SITE_OTHER): Payer: Medicare PPO | Admitting: Nurse Practitioner

## 2022-07-05 ENCOUNTER — Other Ambulatory Visit (INDEPENDENT_AMBULATORY_CARE_PROVIDER_SITE_OTHER): Payer: Medicare PPO

## 2022-07-13 ENCOUNTER — Ambulatory Visit: Payer: Medicare PPO | Admitting: Cardiovascular Disease

## 2022-07-19 ENCOUNTER — Other Ambulatory Visit: Payer: Self-pay | Admitting: Internal Medicine

## 2022-07-26 ENCOUNTER — Other Ambulatory Visit: Payer: Self-pay | Admitting: Cardiovascular Disease

## 2022-08-02 DIAGNOSIS — H2513 Age-related nuclear cataract, bilateral: Secondary | ICD-10-CM | POA: Diagnosis not present

## 2022-08-02 DIAGNOSIS — H353131 Nonexudative age-related macular degeneration, bilateral, early dry stage: Secondary | ICD-10-CM | POA: Diagnosis not present

## 2022-08-02 DIAGNOSIS — Z0100 Encounter for examination of eyes and vision without abnormal findings: Secondary | ICD-10-CM | POA: Diagnosis not present

## 2022-08-02 DIAGNOSIS — E119 Type 2 diabetes mellitus without complications: Secondary | ICD-10-CM | POA: Diagnosis not present

## 2022-08-05 ENCOUNTER — Inpatient Hospital Stay: Payer: Medicare PPO | Attending: Oncology

## 2022-08-05 DIAGNOSIS — C61 Malignant neoplasm of prostate: Secondary | ICD-10-CM | POA: Diagnosis not present

## 2022-08-05 DIAGNOSIS — D751 Secondary polycythemia: Secondary | ICD-10-CM | POA: Insufficient documentation

## 2022-08-05 DIAGNOSIS — D472 Monoclonal gammopathy: Secondary | ICD-10-CM | POA: Diagnosis not present

## 2022-08-05 LAB — CBC WITH DIFFERENTIAL/PLATELET
Abs Immature Granulocytes: 0.04 10*3/uL (ref 0.00–0.07)
Basophils Absolute: 0 10*3/uL (ref 0.0–0.1)
Basophils Relative: 0 %
Eosinophils Absolute: 0.2 10*3/uL (ref 0.0–0.5)
Eosinophils Relative: 3 %
HCT: 51.9 % (ref 39.0–52.0)
Hemoglobin: 16.7 g/dL (ref 13.0–17.0)
Immature Granulocytes: 1 %
Lymphocytes Relative: 13 %
Lymphs Abs: 1.1 10*3/uL (ref 0.7–4.0)
MCH: 28.8 pg (ref 26.0–34.0)
MCHC: 32.2 g/dL (ref 30.0–36.0)
MCV: 89.6 fL (ref 80.0–100.0)
Monocytes Absolute: 0.7 10*3/uL (ref 0.1–1.0)
Monocytes Relative: 8 %
Neutro Abs: 6.5 10*3/uL (ref 1.7–7.7)
Neutrophils Relative %: 75 %
Platelets: 238 10*3/uL (ref 150–400)
RBC: 5.79 MIL/uL (ref 4.22–5.81)
RDW: 17.2 % — ABNORMAL HIGH (ref 11.5–15.5)
WBC: 8.5 10*3/uL (ref 4.0–10.5)
nRBC: 0 % (ref 0.0–0.2)

## 2022-08-05 LAB — COMPREHENSIVE METABOLIC PANEL
ALT: 13 U/L (ref 0–44)
AST: 17 U/L (ref 15–41)
Albumin: 3.6 g/dL (ref 3.5–5.0)
Alkaline Phosphatase: 70 U/L (ref 38–126)
Anion gap: 8 (ref 5–15)
BUN: 23 mg/dL (ref 8–23)
CO2: 20 mmol/L — ABNORMAL LOW (ref 22–32)
Calcium: 9.1 mg/dL (ref 8.9–10.3)
Chloride: 109 mmol/L (ref 98–111)
Creatinine, Ser: 1.41 mg/dL — ABNORMAL HIGH (ref 0.61–1.24)
GFR, Estimated: 50 mL/min — ABNORMAL LOW (ref >60)
Glucose, Bld: 104 mg/dL — ABNORMAL HIGH (ref 70–99)
Potassium: 4.7 mmol/L (ref 3.5–5.1)
Sodium: 137 mmol/L (ref 135–145)
Total Bilirubin: 0.5 mg/dL (ref 0.3–1.2)
Total Protein: 8.4 g/dL — ABNORMAL HIGH (ref 6.5–8.1)

## 2022-08-05 LAB — PSA: Prostatic Specific Antigen: 0.1 ng/mL (ref 0.00–4.00)

## 2022-08-06 LAB — KAPPA/LAMBDA LIGHT CHAINS
Kappa free light chain: 201.2 mg/L — ABNORMAL HIGH (ref 3.3–19.4)
Kappa, lambda light chain ratio: 5.78 — ABNORMAL HIGH (ref 0.26–1.65)
Lambda free light chains: 34.8 mg/L — ABNORMAL HIGH (ref 5.7–26.3)

## 2022-08-09 ENCOUNTER — Ambulatory Visit
Admission: RE | Admit: 2022-08-09 | Discharge: 2022-08-09 | Disposition: A | Discharge status: Home / Self Care | Payer: Medicare PPO | Source: Ambulatory Visit | Attending: Oncology | Admitting: Oncology

## 2022-08-09 DIAGNOSIS — R634 Abnormal weight loss: Secondary | ICD-10-CM | POA: Diagnosis not present

## 2022-08-09 DIAGNOSIS — F17200 Nicotine dependence, unspecified, uncomplicated: Secondary | ICD-10-CM | POA: Diagnosis not present

## 2022-08-09 DIAGNOSIS — J439 Emphysema, unspecified: Secondary | ICD-10-CM | POA: Insufficient documentation

## 2022-08-09 DIAGNOSIS — R911 Solitary pulmonary nodule: Secondary | ICD-10-CM | POA: Insufficient documentation

## 2022-08-11 LAB — MULTIPLE MYELOMA PANEL, SERUM
Albumin SerPl Elph-Mcnc: 3.4 g/dL (ref 2.9–4.4)
Albumin/Glob SerPl: 0.9 (ref 0.7–1.7)
Alpha 1: 0.3 g/dL (ref 0.0–0.4)
Alpha2 Glob SerPl Elph-Mcnc: 0.8 g/dL (ref 0.4–1.0)
B-Globulin SerPl Elph-Mcnc: 1 g/dL (ref 0.7–1.3)
Gamma Glob SerPl Elph-Mcnc: 2.1 g/dL — ABNORMAL HIGH (ref 0.4–1.8)
Globulin, Total: 4.2 g/dL — ABNORMAL HIGH (ref 2.2–3.9)
IgA: 276 mg/dL (ref 61–437)
IgG (Immunoglobin G), Serum: 2093 mg/dL — ABNORMAL HIGH (ref 603–1613)
IgM (Immunoglobulin M), Srm: 135 mg/dL (ref 15–143)
Total Protein ELP: 7.6 g/dL (ref 6.0–8.5)

## 2022-08-12 ENCOUNTER — Inpatient Hospital Stay: Payer: Medicare PPO | Attending: Oncology | Admitting: Oncology

## 2022-08-12 ENCOUNTER — Encounter: Payer: Self-pay | Admitting: Oncology

## 2022-08-12 VITALS — BP 117/67 | HR 80 | Temp 98.0°F | Resp 18 | Wt 198.4 lb

## 2022-08-12 DIAGNOSIS — R634 Abnormal weight loss: Secondary | ICD-10-CM | POA: Diagnosis not present

## 2022-08-12 DIAGNOSIS — C61 Malignant neoplasm of prostate: Secondary | ICD-10-CM

## 2022-08-12 DIAGNOSIS — Z79631 Long term (current) use of antimetabolite agent: Secondary | ICD-10-CM | POA: Diagnosis not present

## 2022-08-12 DIAGNOSIS — M069 Rheumatoid arthritis, unspecified: Secondary | ICD-10-CM | POA: Diagnosis not present

## 2022-08-12 DIAGNOSIS — D472 Monoclonal gammopathy: Secondary | ICD-10-CM

## 2022-08-12 DIAGNOSIS — N183 Chronic kidney disease, stage 3 unspecified: Secondary | ICD-10-CM | POA: Insufficient documentation

## 2022-08-12 DIAGNOSIS — F17200 Nicotine dependence, unspecified, uncomplicated: Secondary | ICD-10-CM

## 2022-08-12 DIAGNOSIS — R918 Other nonspecific abnormal finding of lung field: Secondary | ICD-10-CM | POA: Diagnosis not present

## 2022-08-12 NOTE — Assessment & Plan Note (Signed)
Smoldering kappa light chain multiple myeloma/active myeloma if previous hypermetabolic lesion on PET scan was related to myeloma.Patient was treated with 4 cycles of RVD.  He is not currently on any maintenance therapy due to multiple comorbidities, borderline smoldering multiple myeloma status.    Multiple myeloma panel has been monitored and results are pending.  His kidney function is stable, no anemia.  No hypercalcemia. Consider repeat bone marrow biopsy if he starts to show signs of organ damage 

## 2022-08-12 NOTE — Assessment & Plan Note (Signed)
Normal TSH CT renal protocol in April 2023 reviewed  CT chest showed small lung nodule which needs follow up in 6 months. Otherwise no findings suggesting cancer.

## 2022-08-12 NOTE — Assessment & Plan Note (Signed)
PSA is stable

## 2022-08-12 NOTE — Assessment & Plan Note (Signed)
Encourage his smoke cessation effort.  Erythrocytosis has resolved.

## 2022-08-12 NOTE — Progress Notes (Signed)
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  He is not currently on any maintenance therapy due to multiple comorbidities, borderline smoldering multiple myeloma status.    Multiple myeloma panel has been monitored and results are pending.  His kidney function is stable, no anemia.  No hypercalcemia. Consider repeat bone marrow biopsy if he starts to show signs of organ damage  Unintentional weight loss Normal TSH CT renal protocol in April 2023 reviewed  CT chest showed small lung nodule which needs follow up in 6 months. Otherwise no findings suggesting cancer.   Tobacco use disorder Encourage his smoke cessation effort.  Erythrocytosis has resolved.   Malignant neoplasm of prostate (HCC) PSA is stable.   Orders Placed This Encounter  Procedures   CT Chest Wo Contrast    Standing Status:   Future    Standing Expiration Date:   08/12/2023    Scheduling Instructions:     To be scheduled in approx 6 months    Order Specific Question:   Preferred imaging location?    Answer:   [facility]   CBC with Differential ([place] Only)    Standing Status:   Future    Standing Expiration Date:   08/12/2023   CMP ([place] only)    Standing Status:   Future    Standing Expiration Date:   08/12/2023   Multiple Myeloma Panel (SPEP&IFE w/QIG)    Standing Status:   Future    Standing Expiration Date:   08/12/2023   Kappa/lambda light chains    Standing Status:   Future    Standing Expiration Date:   08/12/2023   Follow up in 4 months.  All questions were answered. The patient knows to call the clinic with any problems, questions or concerns.  Earlie Server, MD,  PhD [facility] Hematology Oncology 08/12/2022     HISTORY OF PRESENTING ILLNESS:  82 y.o.  male with PMH listed below who presents to follow up on the evaluation and management of his abnormal urine protein electrophoresis results. I reviewed the records from [facility], [place] and [place] was performed and HemOnc related medical problems are listed below.  He lives with a friend. He has 3 adult children.   1 Chronic Kidney disease, Stage III: patient follows up with Dr.Lateef.  2 Proteinuria:  02/18/2017: Albumin/Creatinine ratio was 7, Urine protein electrophoresis,Random Urine revealed M Spike 43.2%, total protein of 43.'7mg'$ /dl,  01/05/2017 Albumin/Creatinine ratio was 455.2, Albumin 996.5, , 2.15.2018 Albumin/Creatinine ratio was 80.3, Albumin 73.3, Urine protein electrophoresis,Random Urine revealed M Spike 29.7%, total protein of 29.'1mg'$ /dl, 06/30/2016 Serum protein electrophoresis did not detect M spike.  Autoimmune disorder work up showed Negative anti dsDNA, RNP antibodies, smith antibody, Sjogren antibodies, anti Jo-1, positive antichromotin antibodies, positive ANA,  3 Anemia of chronic kidney disease:  4 Iron deficiency anemia: history of iron deficiency, ferritin was 7 on 08/11/2016. He had EGD and colonoscopy that were done this year which showed duodenitis/esphagitis/diverticulosis/benigh polyps. # small bowel AVMs was ablated at [facility].   5 Rheumatoid arthritis: he follows up with Dr.Kernodle and is on chronic MTX. He Is currently off MTX reports joint pains are controlled, not quite symptomatic.  Patient denies any persistent bone pain or any pain. He continue to feel lack of energy, persistent, not improved with resting or taking naps. He also has lost 25 pounds in the past year which was unintentional.   # Rheumatoid arthritis and currently is off MTX.  # It is ambiguous whether he has truly symptomatic multiple myeloma or a  smoldering myeloma, given that anemia can be secondary to iron deficiency and CKD.  The hypermetabolic 2.1 cm hypermetabolic soft tissue lesion in the left heel, can reflect a Plasmacytoma,which should have been biopsied. However patient initially denies being on any blood thinner, later he was found to be on Plavix with pre biopsy questionnaire screening, and biopsy was held. Patient has to contact cardiologist to hold plavix for seven days before procedure. He feels frustrated about multiple workup and meanwhile he becomes more fatigues, with worsening of kidney function. Patient was reluctant about proceeding additional invasive procedures and wants to start treatment. Decision was made to start active MM treatment.   # He was evaluated by [facility] bone marrow transplant team for autologous bone marrow transplant. He is not considered to  good candidate for transplant. Patient cardiologist Dr. Chancy Milroy had started patient on Lasix 20 mg daily. Lasix was discontinued by [facility] bone marrow transplant team as patient was having dizziness and borderline blood pressure during his clinic visit there.  10/02/2018 He had follow-up left foot/ankle x-ray as a follow-up of his left heel lesion which was initially presented on PET scan and not detectable on follow-up PET scan.  Images were reviewed by me.  No suspicious acute or subacute osseous abnormality  Current Treatment:  S/p RVD x 4 cycles tolerates well except cytopenia.  Revlimid 10 mg PO once per day on days 1 to 14 (dose reduced due to GFR) Revlimid renal dosing of '10mg'$ ,  Velcade 1.3 mg/m2 IV once per day on days 1,  8, 15 Dexamethasone 20 mg PO once per day on days 1, 8, 15 given patient's diabetes Given patient's multiple comorbidity and the side effects of cytopenia during his RVD treatment and the uncertainty of this is really a site of plasmacytoma or not, I recommend not to continue him on maintenance Revlimid.   # repeat small bowel capsule study on  02/16/2019 Which showed multiple AVMs seen in the mid jejunum proximal to the site of prior tattoo. # Patient was referred to [facility] and he is status post 06/19/2019 double-balloon enteroscopy with findings of 5 AVMs in the jejunum and 2 AVMs in the duodenum and AVMs were ablated with APC.  Tattoo was seen in the area. If further bleeding, recommend to consider treatment with octreotide. Patient takes Protonix daily.    INTERVAL HISTORY Patient presents for follow-up of management of multiple myeloma/smoldering multiple myeloma and iron deficiency, secondary erythrocytosis. Patient has tried to smoke less.  Patient has sleep apnea, he uses CPAP machine as much as he can, approximately 2-3 times per week. He has no new complaints.    Review of Systems  Constitutional:  Positive for fatigue. Negative for appetite change, chills, fever and unexpected weight change.  HENT:   Negative for hearing loss and voice change.   Eyes:  Negative for eye problems and icterus.  Respiratory:  Negative for chest tightness, cough and shortness of breath.   Cardiovascular:  Negative for chest pain and leg swelling.  Gastrointestinal:  Negative for abdominal distention, abdominal pain and diarrhea.  Endocrine: Negative for hot flashes.  Genitourinary:  Negative for  difficulty urinating, dysuria and frequency.   Musculoskeletal:  Positive for arthralgias.  Skin:  Negative for itching and rash.  Neurological:  Negative for light-headedness and numbness.  Hematological:  Negative for adenopathy. Does not bruise/bleed easily.  Psychiatric/Behavioral:  Negative for confusion.      MEDICAL HISTORY:  Past Medical History:  Diagnosis Date   AAA (abdominal aortic aneurysm) ([place])    a.) s/p EVAR 11/05/2013.   Anxiety    a.) on BZO (alprazolam) PRN   Aortic atherosclerosis (HCC)    Atrial fibrillation (HCC)    a.) CHA2DS2-VASc = 6 (age x2, CHF, HTN, PVD/aortic plaque, T2DM). b.) rate/rhythm maintained without use  of pharmacological intervention; chronically anticoagulated using dose reduced apixaban.   BPH (benign prostatic hyperplasia)    CHF (congestive heart failure) ([place])    a.)  TTE 03/16/2021: EF 63%; moderate LVH; mild BAE mild to moderate pan valvular regurgitation; G1DD.   CKD (chronic kidney disease), stage III (HCC)    Colon polyp 07/07/2015   TUBULAR ADENOMA WITH AT LEAST HIGH-GRADE / Dr Rayann Heman   COPD (chronic obstructive pulmonary disease) ([place])    Coronary artery disease    a.) coronary CTA 01/10/2015 CA score 722.3; mod-sev LAD disease; mild LCx disease; 50-60% RCA disease.   Current use of long term anticoagulation    a.) dose reduced apixaban   Diverticulosis    Erectile dysfunction    GERD (gastroesophageal reflux disease)    Gout    Heart murmur    Hepatic cyst    Hepatic steatosis    Hepatitis    Hypercholesterolemia    Hypertension    Iron deficiency anemia    Migraines    Multiple myeloma (HCC)    Nephrolithiasis    OSA on CPAP    Presence of permanent cardiac pacemaker    Prostate cancer ([place]) 01/01/13, 01/30/14   Gleason 3+4=7, volume 46.6 cc   PVD (peripheral vascular disease) ([place])    Renal cyst    Rheumatoid arthritis ([place])    S/P radiation therapy  04/03/2014 through 06/04/2014                                                      Prostate 7800 cGy in 40 sessions                           SSS (sick sinus syndrome) ([place])    a.) s/p Medtronic Azure PPM placement 08/26/2016   T2DM (type 2 diabetes mellitus) ([place])    Third degree heart block ([place]) 08/26/2016   a.) s/p PPM placement (Medtronic Azure XT DR MRI [id number] S/N: [id number] H) on 08/26/2016   Vitamin D deficiency     SURGICAL HISTORY: Past Surgical History:  Procedure Laterality Date   ABDOMINAL AORTIC ANEURYSM REPAIR  11/05/2013   COLON SURGERY  08/06/2015   Right hemicolectomy for tubulovillous adenoma with high-grade dysplasia.   COLONOSCOPY WITH PROPOFOL N/A 07/07/2015   Procedure: COLONOSCOPY WITH  PROPOFOL;  Surgeon: Josefine Class, MD;  Location: [facility] ENDOSCOPY;  Service: Endoscopy;  Laterality: N/A;   COLONOSCOPY WITH PROPOFOL N/A 10/28/2016   Procedure: COLONOSCOPY WITH PROPOFOL;  Surgeon: [name], MD;  Location: [facility] ENDOSCOPY;  Service: Endoscopy;  Laterality: N/A;   CYSTOSCOPY W/ RETROGRADES Bilateral 11/16/2021   Procedure: CYSTOSCOPY WITH RETROGRADE  PYELOGRAM;  Surgeon: Hollice Espy, MD;  Location: [facility] ORS;  Service: Urology;  Laterality: Bilateral;   CYSTOSCOPY WITH BIOPSY N/A 11/16/2021   Procedure: CYSTOSCOPY WITH BLADDER BIOPSY;  Surgeon: Hollice Espy, MD;  Location: [facility] ORS;  Service: Urology;  Laterality: N/A;   ESOPHAGOGASTRODUODENOSCOPY (EGD) WITH PROPOFOL N/A 10/28/2016   Procedure: ESOPHAGOGASTRODUODENOSCOPY (EGD) WITH PROPOFOL;  Surgeon: [name], MD;  Location: [facility] ENDOSCOPY;  Service: Endoscopy;  Laterality: N/A;   GIVENS CAPSULE STUDY N/A 07/25/2017   Procedure: GIVENS CAPSULE STUDY;  Surgeon: [name], MD;  Location: [facility] ENDOSCOPY;  Service: Gastroenterology;  Laterality: N/A;   GIVENS CAPSULE STUDY N/A 01/29/2019   Procedure: GIVENS CAPSULE STUDY;  Surgeon: [name], MD;  Location: [facility] ENDOSCOPY;  Service: Gastroenterology;  Laterality: N/A;   LAPAROSCOPIC RIGHT COLECTOMY Right 08/08/2015   Procedure: LAPAROSCOPIC RIGHT COLECTOMY;  Surgeon: Robert Bellow, MD;  Location: [facility] ORS;  Service: General;  Laterality: Right;   NASAL SINUS SURGERY     PACEMAKER INSERTION Left 08/26/2016   Procedure: INSERTION PACEMAKER;  Surgeon: Isaias Cowman, MD;  Location: [facility] ORS;  Service: Cardiovascular;  Laterality: Left;   PROSTATE BIOPSY  01/01/13, 01/30/14   Gleason 3+3=6, vol 46.6 cc   TONSILLECTOMY     uvula surgery     for sleep apnea    SOCIAL HISTORY: Social History   Socioeconomic History   Marital status: Divorced    Spouse name: Not on file   Number of children: Not on file   Years of education: Not on file   Highest  education level: Not on file  Occupational History   Not on file  Tobacco Use   Smoking status: Every Day    Packs/day: 1.00    Years: 60.00    Total pack years: 60.00    Types: Cigarettes   Smokeless tobacco: Never   Tobacco comments:    DOWN TO 1/2 PPD  Vaping Use   Vaping Use: Never used  Substance and Sexual Activity   Alcohol use: Yes    Alcohol/week: 1.0 standard drink of alcohol    Types: 1 Cans of beer per week    Comment: occasionally   Drug use: No   Sexual activity: Not Currently  Other Topics Concern   Not on file  Social History Narrative   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  MEDICATIONS:  Current Outpatient Medications  Medication Sig Dispense Refill   albuterol (PROVENTIL HFA;VENTOLIN HFA) 108 (90 Base) MCG/ACT inhaler Inhale 1 puff into the lungs every 6 (six) hours as needed.     allopurinol (ZYLOPRIM) 100 MG tablet Take 100 mg by mouth daily.     ALPRAZolam (XANAX) 0.25 MG tablet Take by mouth 2 (two) times daily as needed.     apixaban (ELIQUIS) 2.5 MG  TABS tablet Take 2.5 mg by mouth 2 (two) times daily.      atorvastatin (LIPITOR) 80 MG tablet Take 80 mg by mouth at bedtime.     benazepril (LOTENSIN) 10 MG tablet Take 10 mg by mouth daily.     Cholecalciferol 1.25 MG (50000 UT) capsule Take 50,000 Units by mouth once a week. wednesday     ezetimibe (ZETIA) 10 MG tablet Take 10  mg by mouth daily.     fluticasone (FLONASE) 50 MCG/ACT nasal spray Place 1 spray into both nostrils daily as needed for rhinitis.     fluticasone furoate-vilanterol (BREO ELLIPTA) 100-25 MCG/INH AEPB Inhale 1 puff into the lungs daily.      gabapentin (NEURONTIN) 400 MG capsule Take 400 mg by mouth 3 (three) times daily.     HUMIRA PEN 40 MG/0.4ML PNKT Inject 40 mg into the skin every 14 (fourteen) days.     isosorbide mononitrate (IMDUR) 60 MG 24 hr tablet Take 60 mg by mouth daily.     JARDIANCE 25 MG TABS tablet 25 mg daily.     metoprolol succinate (TOPROL-XL) 25 MG 24 hr tablet Take 1 tablet by mouth once daily 90 tablet 0   pantoprazole (PROTONIX) 20 MG tablet Take 20 mg by mouth daily.     tamsulosin (FLOMAX) 0.4 MG CAPS capsule TAKE 1 CAPSULE BY MOUTH ONCE DAILY AFTER  SUPPER (Patient taking differently: 0.4 mg daily.) 90 capsule 3   traMADol-acetaminophen (ULTRACET) 37.5-325 MG tablet Take 1 tablet by mouth every 6 (six) hours as needed for severe pain.     triamcinolone cream (KENALOG) 0.1 % Apply 1 Application topically 2 (two) times daily. to affected area     No current facility-administered medications for this visit.      Marland Kitchen  PHYSICAL EXAMINATION: ECOG PERFORMANCE STATUS: 1 - Symptomatic but completely ambulatory Vitals:   08/12/22 1447  BP: 117/67  Pulse: 80  Resp: 18  Temp: 98 F (36.7 C)   Filed Weights   08/12/22 1447  Weight: 198 lb 6.4 oz (90 kg)   Physical Exam Constitutional:      General: He is not in acute distress.    Appearance: He is not diaphoretic.  HENT:     Head: Normocephalic and atraumatic.     Nose: Nose normal.     Mouth/Throat:     Pharynx: No oropharyngeal exudate.  Eyes:     General: No scleral icterus.       Right eye: No discharge.        Left eye: No discharge.     Pupils: Pupils are equal, round, and reactive to light.  Neck:     Vascular: No JVD.  Cardiovascular:     Rate and Rhythm: Normal rate and regular rhythm.      Heart sounds: Normal heart sounds. No murmur heard.    No friction rub.  Pulmonary:     Effort: Pulmonary effort is normal. No respiratory distress.     Breath sounds: Normal breath sounds. No wheezing or rales.  Chest:     Chest wall: No tenderness.  Abdominal:     General: Bowel sounds are normal. There is no distension.     Palpations: Abdomen is soft. There is no mass.     Tenderness: There is no abdominal tenderness.  Musculoskeletal:        General: No tenderness. Normal range of motion.     Cervical back: Normal range of motion and neck supple.  Lymphadenopathy:     Cervical: No cervical adenopathy.  Skin:    General: Skin is warm and dry.     Findings: No erythema.     Comments: Hypopigmentation of skin  Neurological:     Mental Status: He is alert and oriented to person, place, and time.  Psychiatric:        Mood and Affect: Affect normal.     LABORATORY DATA:  I have reviewed the data as listed    Latest Ref Rng & Units 08/05/2022    1:22 PM 04/05/2022   12:51 PM 12/07/2021    1:14 PM  CBC  WBC 4.0 - 10.5 K/uL 8.5  8.6  8.5   Hemoglobin 13.0 - 17.0 g/dL 16.7  15.3  17.2   Hematocrit 39.0 - 52.0 % 51.9  47.5  52.6   Platelets 150 - 400 K/uL 238  238  232       Latest Ref Rng & Units 08/05/2022    1:22 PM 04/05/2022   12:51 PM 12/07/2021    1:14 PM  CMP  Glucose 70 - 99 mg/dL 104  99  88   BUN 8 - 23 mg/dL '23  20  18   '$ Creatinine 0.61 - 1.24 mg/dL 1.41  1.30  1.45   Sodium 135 - 145 mmol/L 137  137  134   Potassium 3.5 - 5.1 mmol/L 4.7  4.0  4.1   Chloride 98 - 111 mmol/L 109  112  105   CO2 22 - 32 mmol/L '20  21  23   '$ Calcium 8.9 - 10.3 mg/dL 9.1  8.7  8.6   Total Protein 6.5 - 8.1 g/dL 8.4  7.8  7.9   Total Bilirubin 0.3 - 1.2 mg/dL 0.5  0.4  0.6   Alkaline Phos 38 - 126 U/L 70  70  74   AST 15 - 41 U/L '17  16  17   '$ ALT 0 - 44 U/L '13  13  19      '$ Bone marrow biopsy 03/21/2017  Bone Marrow, Aspirate,Biopsy, and Clot, left iliac - HYPERCELLULAR BONE  MARROW FOR AGE WITH TRILINEAGE HEMATOPOIESIS. - PLASMACYTOSIS (PLASMA CELLS 8%)  Karyotype: loss of Y chromosome Cytogenetic MDS FISH Panel negative.   Bone marrow 04/06/2017  Bone Marrow, Aspirate,Biopsy, and Clot, right iliac and core - HYPERCELLULAR BONE MARROW FOR AGE WITH PLASMA CELL NEOPLASM - TRILINEAGE HEMATOPOIESIS. - SEE COMMENT. PERIPHERAL BLOOD: - MICROCYTIC-HYPOCHROMIC ANEMIA. Diagnosis Note The bone marrow is hypercellular with trilineage hematopoiesis but with relative abundance of erythroid precursors and increased number of megakaryocytes with nonspecific changes. Significant dyspoiesis is not seen. Iron stores are present with no ring sideroblasts. The plasma cells are increased in number representing 8% of all cells in the aspirate although focal areas in the core biopsy show 10 to 20% as primarily seen by CD138 stain. In situ hybridization for kappa and lambda light chains show kappa light chain restriction consistent with plasma cell neoplasm. Correlation with cytogenetic and FISH studies is recommended. (BNS:ecj/gt 04/07/2017)  IMAGE STUDIES I have personally reviewed below image results.  03/15/2017 DG bone survey Met: Nonspecific lucent lesion in the distal right ulna measuring 15-16 mm, but otherwise normal bone mineralization for age throughout the visible skeleton. A solitary lytic lesion in a distal extremity would be an unusual presentation of multiple myeloma, and I favor the distal right ulna lesion is benign. Recommend correlation with serum and urine protein electrophoresis.  04/14/2017 PET scan: 1. 2.1 cm  hypermetabolic soft tissue lesion in the left heel, adjacent to the calcaneal tuberosity. 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 2. Mottled FDG accumulation diffusely in the marrow space without other frankly overt hypermetabolic bony lesion. 3. Coronary artery and thoracoabdominal aortic atherosclerosis with abdominal aortic stent graft visualized in  situ. 4. Bilateral renal cysts and bilateral nonobstructing renal stones.   08/24/2017 PET scan 1. No hypermetabolic osseous lesions. No definite signs of multiple myeloma on CT imaging. 2. Interval improvement in soft tissue activity along the medial aspect of the left calcaneal tuberosity, likely plantar fasciitis. 3. New small left pleural effusion. 4. Bilateral inguinal hernias containing small bowel on the right and sigmoid colon on the left. No evidence of incarceration or obstruction. 5. Otherwise stable incidental findings including diffuse atherosclerosis, bilateral renal cysts, emphysema and sigmoid diverticulosis.

## 2022-08-17 ENCOUNTER — Other Ambulatory Visit: Payer: Self-pay | Admitting: Internal Medicine

## 2022-08-20 DIAGNOSIS — Z79899 Other long term (current) drug therapy: Secondary | ICD-10-CM | POA: Diagnosis not present

## 2022-08-20 DIAGNOSIS — M0579 Rheumatoid arthritis with rheumatoid factor of multiple sites without organ or systems involvement: Secondary | ICD-10-CM | POA: Diagnosis not present

## 2022-08-20 DIAGNOSIS — C9001 Multiple myeloma in remission: Secondary | ICD-10-CM | POA: Diagnosis not present

## 2022-08-30 ENCOUNTER — Other Ambulatory Visit: Payer: Self-pay | Admitting: Cardiovascular Disease

## 2022-09-07 ENCOUNTER — Other Ambulatory Visit: Payer: Self-pay | Admitting: Internal Medicine

## 2022-09-14 ENCOUNTER — Telehealth: Payer: Self-pay

## 2022-09-14 NOTE — Patient Outreach (Addendum)
  Care Coordination   Initial Visit Note   09/14/2022 Name: LEM LANS MRN: [id number] DOB: Aug 29, 1940  NICHOLES HUML is a 82 y.o. year old male who sees Sherron Monday, MD for primary care. I spoke with  [name] by phone today.  What matters to the patients health and wellness today?  Patient denies having any nursing or community resource needs at this time.     Goals Addressed             This Visit's Progress    COMPLETED: care coordination activities - no follow up needed.       Interventions Today    Flowsheet Row Most Recent Value  General Interventions   General Interventions Discussed/Reviewed General Interventions Discussed  [Care coordination program/ services discussed. SDOH survey completed. AWV discussed and patient advised to contact provider office to schedule.  Advised to contact primary care provider if care coordination services needed in the future.]              SDOH assessments and interventions completed:  Yes  SDOH Interventions Today    Flowsheet Row Most Recent Value  SDOH Interventions   Food Insecurity Interventions Intervention Not Indicated  Housing Interventions Intervention Not Indicated  Transportation Interventions Intervention Not Indicated        Care Coordination Interventions:  Yes, provided   Follow up plan: No further intervention required.   Encounter Outcome:  Pt. Visit Completed   George Ina RN,BSN,CCM [facility] Care Coordination [phone] direct line

## 2022-09-16 ENCOUNTER — Encounter: Payer: Self-pay | Admitting: Internal Medicine

## 2022-09-16 ENCOUNTER — Ambulatory Visit (INDEPENDENT_AMBULATORY_CARE_PROVIDER_SITE_OTHER): Payer: Medicare PPO | Admitting: Podiatry

## 2022-09-16 ENCOUNTER — Other Ambulatory Visit: Payer: Self-pay | Admitting: Internal Medicine

## 2022-09-16 ENCOUNTER — Encounter: Payer: Self-pay | Admitting: Podiatry

## 2022-09-16 DIAGNOSIS — E1140 Type 2 diabetes mellitus with diabetic neuropathy, unspecified: Secondary | ICD-10-CM

## 2022-09-16 DIAGNOSIS — B351 Tinea unguium: Secondary | ICD-10-CM

## 2022-09-16 DIAGNOSIS — M79609 Pain in unspecified limb: Secondary | ICD-10-CM | POA: Diagnosis not present

## 2022-09-16 NOTE — Progress Notes (Signed)
This patient returns to my office for at risk foot care.  This patient requires this care by a professional since this patient will be at risk due to having PAD and diabetes. And coagulation defect.  Patient is taking eliquiss.  This patient is unable to cut nails himself since the patient cannot reach his nails.These nails are painful walking and wearing shoes.  This patient presents for at risk foot care today.  Patient has self inflicted cut along the medial border right big toe.  General Appearance  Alert, conversant and in no acute stress.  Vascular  Dorsalis pedis and posterior tibial  pulses are not  palpable  bilaterally.  Capillary return is within normal limits  bilaterally. Temperature is within normal limits  bilaterally.  Neurologic  Senn-Weinstein monofilament wire test within normal limits  bilaterally. Muscle power within normal limits bilaterally.  Nails Thick disfigured discolored nails with subungual debris  from hallux to fifth toes bilaterally. No evidence of bacterial infection or drainage bilaterally.  Orthopedic  No limitations of motion  feet .  No crepitus or effusions noted.  No bony pathology or digital deformities noted.  HAV wuth hammer toe  B/L.  Rigid contracted second digit right foot.  Skin  normotropic skin with no porokeratosis noted bilaterally.  No signs of infections or ulcers noted.     Onychomycosis  Pain in right toes  Pain in left toes  Consent was obtained for treatment procedures.   Mechanical debridement of nails 1-5  bilaterally performed with a nail nipper.  Filed with dremel without incident. Self inflicted cut is healing.     Return office visit    10 weeks                 Told patient to return for periodic foot care and evaluation due to potential at risk complications.   Sanjna Haskew DPM  

## 2022-09-17 ENCOUNTER — Other Ambulatory Visit: Payer: Self-pay | Admitting: Internal Medicine

## 2022-09-17 NOTE — Telephone Encounter (Signed)
Can not fill until supervising physician is back monday

## 2022-09-23 ENCOUNTER — Other Ambulatory Visit: Payer: Self-pay | Admitting: Urology

## 2022-09-23 DIAGNOSIS — N138 Other obstructive and reflux uropathy: Secondary | ICD-10-CM

## 2022-09-28 ENCOUNTER — Other Ambulatory Visit: Payer: Self-pay | Admitting: Cardiovascular Disease

## 2022-09-28 ENCOUNTER — Ambulatory Visit: Payer: Medicare PPO | Admitting: Internal Medicine

## 2022-09-29 ENCOUNTER — Other Ambulatory Visit: Payer: Self-pay | Admitting: Internal Medicine

## 2022-10-04 ENCOUNTER — Other Ambulatory Visit: Payer: Self-pay | Admitting: Nurse Practitioner

## 2022-10-07 ENCOUNTER — Other Ambulatory Visit: Payer: Medicare PPO

## 2022-10-07 ENCOUNTER — Other Ambulatory Visit: Payer: Self-pay | Admitting: Internal Medicine

## 2022-10-07 DIAGNOSIS — E119 Type 2 diabetes mellitus without complications: Secondary | ICD-10-CM | POA: Diagnosis not present

## 2022-10-07 DIAGNOSIS — F411 Generalized anxiety disorder: Secondary | ICD-10-CM | POA: Diagnosis not present

## 2022-10-07 DIAGNOSIS — I10 Essential (primary) hypertension: Secondary | ICD-10-CM | POA: Diagnosis not present

## 2022-10-08 LAB — COMPREHENSIVE METABOLIC PANEL
ALT: 14 IU/L (ref 0–44)
AST: 16 IU/L (ref 0–40)
Albumin/Globulin Ratio: 1.1 — ABNORMAL LOW (ref 1.2–2.2)
Albumin: 4 g/dL (ref 3.8–4.8)
Alkaline Phosphatase: 82 IU/L (ref 44–121)
BUN/Creatinine Ratio: 17 (ref 10–24)
BUN: 28 mg/dL — ABNORMAL HIGH (ref 8–27)
Bilirubin Total: 0.4 mg/dL (ref 0.0–1.2)
CO2: 21 mmol/L (ref 20–29)
Calcium: 9.7 mg/dL (ref 8.6–10.2)
Chloride: 106 mmol/L (ref 96–106)
Creatinine, Ser: 1.62 mg/dL — ABNORMAL HIGH (ref 0.76–1.27)
Globulin, Total: 3.5 g/dL (ref 1.5–4.5)
Glucose: 100 mg/dL — ABNORMAL HIGH (ref 70–99)
Potassium: 4.5 mmol/L (ref 3.5–5.2)
Sodium: 141 mmol/L (ref 134–144)
Total Protein: 7.5 g/dL (ref 6.0–8.5)
eGFR: 43 mL/min/{1.73_m2} — ABNORMAL LOW (ref >59)

## 2022-10-08 LAB — LIPID PANEL W/O CHOL/HDL RATIO
Cholesterol, Total: 147 mg/dL (ref 100–199)
HDL: 50 mg/dL (ref >39)
LDL Chol Calc (NIH): 79 mg/dL (ref 0–99)
Triglycerides: 96 mg/dL (ref 0–149)
VLDL Cholesterol Cal: 18 mg/dL (ref 5–40)

## 2022-10-08 LAB — HGB A1C W/O EAG: Hgb A1c MFr Bld: 6.3 % — ABNORMAL HIGH (ref 4.8–5.6)

## 2022-10-11 ENCOUNTER — Other Ambulatory Visit: Payer: Self-pay | Admitting: Internal Medicine

## 2022-10-12 ENCOUNTER — Encounter: Payer: Self-pay | Admitting: Internal Medicine

## 2022-10-12 ENCOUNTER — Ambulatory Visit: Payer: Medicare PPO | Admitting: Internal Medicine

## 2022-10-12 VITALS — BP 112/70 | HR 65 | Ht 71.0 in | Wt 203.8 lb

## 2022-10-12 DIAGNOSIS — E119 Type 2 diabetes mellitus without complications: Secondary | ICD-10-CM | POA: Diagnosis not present

## 2022-10-12 DIAGNOSIS — E782 Mixed hyperlipidemia: Secondary | ICD-10-CM | POA: Diagnosis not present

## 2022-10-12 DIAGNOSIS — K760 Fatty (change of) liver, not elsewhere classified: Secondary | ICD-10-CM | POA: Diagnosis not present

## 2022-10-12 DIAGNOSIS — J301 Allergic rhinitis due to pollen: Secondary | ICD-10-CM

## 2022-10-12 DIAGNOSIS — I739 Peripheral vascular disease, unspecified: Secondary | ICD-10-CM | POA: Diagnosis not present

## 2022-10-12 LAB — GLUCOSE, POCT (MANUAL RESULT ENTRY): POC Glucose: 93 mg/dl (ref 70–99)

## 2022-10-12 MED ORDER — GUAIFENESIN ER 600 MG PO TB12
600.0000 mg | ORAL_TABLET | Freq: Two times a day (BID) | ORAL | 0 refills | Status: AC
Start: 2022-10-12 — End: 2022-10-22

## 2022-10-12 MED ORDER — AZELASTINE HCL 137 MCG/SPRAY NA SOLN
1.0000 | Freq: Every day | NASAL | 2 refills | Status: DC
Start: 2022-10-12 — End: 2023-01-12

## 2022-10-12 NOTE — Progress Notes (Signed)
Established Patient Office Visit  Subjective:  Patient ID: Omar [name], male    DOB: 1941/05/12  Age: 82 y.o. MRN: [id number]  Chief Complaint  Patient presents with   Follow-up    3 month labs    No new complaints, here for lab review and medication refills. Labs reviewed and notable for well controlled diabetes, A1c at target, lipids at target with unremarkable cmp. Denies any hypoglycemic episodes and home bg readings have been at target. Arthritic pain well controlled on Humira.    No other concerns at this time.   Past Medical History:  Diagnosis Date   AAA (abdominal aortic aneurysm) (HCC)    a.) Trai Ells/p EVAR 11/05/2013.   Anxiety    a.) on BZO (alprazolam) PRN   Aortic atherosclerosis (HCC)    Atrial fibrillation (HCC)    a.) CHA2DS2-VASc = 6 (age x2, CHF, HTN, PVD/aortic plaque, T2DM). b.) rate/rhythm maintained without use of pharmacological intervention; chronically anticoagulated using dose reduced apixaban.   BPH (benign prostatic hyperplasia)    CHF (congestive heart failure) (HCC)    a.)  TTE 03/16/2021: EF 63%; moderate LVH; mild BAE mild to moderate pan valvular regurgitation; G1DD.   CKD (chronic kidney disease), stage III (HCC)    Colon polyp 07/07/2015   TUBULAR ADENOMA WITH AT LEAST HIGH-GRADE / Dr Shelle Iron   COPD (chronic obstructive pulmonary disease) (HCC)    Coronary artery disease    a.) coronary CTA 01/10/2015 CA score 722.3; mod-sev LAD disease; mild LCx disease; 50-60% RCA disease.   Current use of long term anticoagulation    a.) dose reduced apixaban   Diverticulosis    Erectile dysfunction    GERD (gastroesophageal reflux disease)    Gout    Heart murmur    Hepatic cyst    Hepatic steatosis    Hepatitis    Hypercholesterolemia    Hypertension    Iron deficiency anemia    Migraines    Multiple myeloma (HCC)    Nephrolithiasis    OSA on CPAP    Presence of permanent cardiac pacemaker    Prostate cancer (HCC) 01/01/13, 01/30/14   Gleason  3+4=7, volume 46.6 cc   PVD (peripheral vascular disease) (HCC)    Renal cyst    Rheumatoid arthritis (HCC)    Athene Schuhmacher/P radiation therapy  04/03/2014 through 06/04/2014                                                      Prostate 7800 cGy in 40 sessions                           SSS (sick sinus syndrome) (HCC)    a.) Talis Iwan/p Medtronic Azure PPM placement 08/26/2016   T2DM (type 2 diabetes mellitus) (HCC)    Third degree heart block (HCC) 08/26/2016   a.) Adriauna Campton/p PPM placement (Medtronic Azure XT DR MRI [id number] Jaythan Hinely/N: [id number]) on 08/26/2016   Vitamin D deficiency     Past Surgical History:  Procedure Laterality Date   ABDOMINAL AORTIC ANEURYSM REPAIR  11/05/2013   COLON SURGERY  08/06/2015   Right hemicolectomy for tubulovillous adenoma with high-grade dysplasia.   COLONOSCOPY WITH PROPOFOL N/A 07/07/2015   Procedure: COLONOSCOPY WITH PROPOFOL;  Surgeon: Elnita Maxwell, MD;  Location: [facility] ENDOSCOPY;  Service:  Endoscopy;  Laterality: N/A;   COLONOSCOPY WITH PROPOFOL N/A 10/28/2016   Procedure: COLONOSCOPY WITH PROPOFOL;  Surgeon: Wyline Mood, MD;  Location: [facility] ENDOSCOPY;  Service: Endoscopy;  Laterality: N/A;   CYSTOSCOPY W/ RETROGRADES Bilateral 11/16/2021   Procedure: CYSTOSCOPY WITH RETROGRADE PYELOGRAM;  Surgeon: Vanna Scotland, MD;  Location: [facility] ORS;  Service: Urology;  Laterality: Bilateral;   CYSTOSCOPY WITH BIOPSY N/A 11/16/2021   Procedure: CYSTOSCOPY WITH BLADDER BIOPSY;  Surgeon: Vanna Scotland, MD;  Location: [facility] ORS;  Service: Urology;  Laterality: N/A;   ESOPHAGOGASTRODUODENOSCOPY (EGD) WITH PROPOFOL N/A 10/28/2016   Procedure: ESOPHAGOGASTRODUODENOSCOPY (EGD) WITH PROPOFOL;  Surgeon: Wyline Mood, MD;  Location: [facility] ENDOSCOPY;  Service: Endoscopy;  Laterality: N/A;   GIVENS CAPSULE STUDY N/A 07/25/2017   Procedure: GIVENS CAPSULE STUDY;  Surgeon: Wyline Mood, MD;  Location: [facility] ENDOSCOPY;  Service: Gastroenterology;  Laterality: N/A;   GIVENS CAPSULE STUDY N/A 01/29/2019    Procedure: GIVENS CAPSULE STUDY;  Surgeon: Wyline Mood, MD;  Location: [facility] ENDOSCOPY;  Service: Gastroenterology;  Laterality: N/A;   LAPAROSCOPIC RIGHT COLECTOMY Right 08/08/2015   Procedure: LAPAROSCOPIC RIGHT COLECTOMY;  Surgeon: Earline Mayotte, MD;  Location: [facility] ORS;  Service: General;  Laterality: Right;   NASAL SINUS SURGERY     PACEMAKER INSERTION Left 08/26/2016   Procedure: INSERTION PACEMAKER;  Surgeon: Marcina Millard, MD;  Location: [facility] ORS;  Service: Cardiovascular;  Laterality: Left;   PROSTATE BIOPSY  01/01/13, 01/30/14   Gleason 3+3=6, vol 46.6 cc   TONSILLECTOMY     uvula surgery     for sleep apnea    Social History   Socioeconomic History   Marital status: Divorced    Spouse name: Not on file   Number of children: Not on file   Years of education: Not on file   Highest education level: Not on file  Occupational History   Not on file  Tobacco Use   Smoking status: Every Day    Packs/day: 1.00    Years: 60.00    Additional pack years: 0.00    Total pack years: 60.00    Types: Cigarettes   Smokeless tobacco: Never   Tobacco comments:    DOWN TO 1/2 PPD  Vaping Use   Vaping Use: Never used  Substance and Sexual Activity   Alcohol use: Yes    Alcohol/week: 1.0 standard drink of alcohol    Types: 1 Cans of beer per week    Comment: occasionally   Drug use: No   Sexual activity: Not Currently  Other Topics Concern   Not on file  Social History Narrative   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   Social Determinants of Health   Financial Resource Strain: Not on file  Food Insecurity: No Food Insecurity (09/14/2022)   Hunger Vital Sign    Worried About Running Out of Food in the Last Year: Never true    Ran Out of Food in the Last Year: Never true  Transportation Needs: No Transportation Needs (09/14/2022)   PRAPARE - Administrator, Civil Service (Medical): No    Lack of Transportation (Non-Medical): No  Physical Activity: Not on  file  Stress: Not on file  Social Connections: Not on file  Intimate Partner Violence: Not on file    Family History  Problem Relation Age of Onset   Heart attack Mother    Cirrhosis Father    Pancreatic cancer Brother    Diabetes Brother    Diabetes Daughter  medication induced for cancer treatments   Cancer Daughter        breast, brain    No Known Allergies  Review of Systems  Constitutional: Negative.   HENT:         Nasal discharge  Eyes: Negative.   Respiratory:  Positive for cough.   Cardiovascular: Negative.   Gastrointestinal: Negative.   Genitourinary: Negative.   Skin: Negative.   Neurological: Negative.   Endo/Heme/Allergies: Negative.        Objective:   BP 112/70   Pulse 65   Ht 5\' 11"  (1.803 m)   Wt 203 lb 12.8 oz (92.4 kg)   SpO2 97%   BMI 28.42 kg/m   Vitals:   10/12/22 1002  BP: 112/70  Pulse: 65  Height: 5\' 11"  (1.803 m)  Weight: 203 lb 12.8 oz (92.4 kg)  SpO2: 97%  BMI (Calculated): 28.44    Physical Exam Vitals reviewed.  Constitutional:      Appearance: Normal appearance.  HENT:     Head: Normocephalic.     Left Ear: There is no impacted cerumen.     Nose: Nose normal.     Mouth/Throat:     Mouth: Mucous membranes are moist.     Pharynx: No posterior oropharyngeal erythema.  Eyes:     Extraocular Movements: Extraocular movements intact.     Pupils: Pupils are equal, round, and reactive to light.  Cardiovascular:     Rate and Rhythm: Regular rhythm.     Chest Wall: PMI is not displaced.     Pulses: Normal pulses.     Heart sounds: Normal heart sounds. No murmur heard. Pulmonary:     Effort: Pulmonary effort is normal.     Breath sounds: Normal air entry. Examination of the right-lower field reveals rales. Examination of the left-lower field reveals rales. Rales present. No rhonchi.  Abdominal:     General: Abdomen is flat. Bowel sounds are normal. There is no distension.     Palpations: Abdomen is soft. There  is no hepatomegaly, splenomegaly or mass.     Tenderness: There is no abdominal tenderness.  Musculoskeletal:        General: Normal range of motion.     Cervical back: Normal range of motion and neck supple.     Right lower leg: No edema.     Left lower leg: No edema.  Skin:    General: Skin is warm and dry.  Neurological:     General: No focal deficit present.     Mental Status: He is alert and oriented to person, place, and time.     Cranial Nerves: No cranial nerve deficit.     Motor: No weakness.  Psychiatric:        Mood and Affect: Mood normal.        Behavior: Behavior normal.      Results for orders placed or performed in visit on 10/12/22  POCT Glucose (CBG)  Result Value Ref Range   POC Glucose 93 70 - 99 mg/dl    Recent Results (from the past 2160 hour(Francile Woolford))  PSA     Status: None   Collection Time: 08/05/22  1:22 PM  Result Value Ref Range   Prostatic Specific Antigen 0.10 0.00 - 4.00 ng/mL    Comment: (NOTE) While PSA levels of <=4.00 ng/ml are reported as reference range, some men with levels below 4.00 ng/ml can have prostate cancer and many men with PSA above 4.00 ng/ml do not have  prostate cancer.  Other tests such as free PA, age specific reference ranges, PA velocity and PA doubling time may be helpful especially in men less than 68 years old. Performed at t [facility], [address], [place], [place] [address]   Multiple Myeloma Panel (PEP&IFE w/QIG)     tatus: Abnormal   Collection Time: 08/05/22  1:22 PM  Result Value Ref Range   IgG (Immunoglobin G), erum 2,093 (H) 603 - 1,613 mg/dL   IgA 604 61 - 540 mg/dL   IgM (Immunoglobulin M), rm 135 15 - 143 mg/dL   Total Protein ELP 7.6 6.0 - 8.5 g/dL   Albumin erPl Elph-Mcnc 3.4 2.9 - 4.4 g/dL   Alpha 1 0.3 0.0 - 0.4 g/dL   Alpha2 Glob erPl Elph-Mcnc 0.8 0.4 - 1.0 g/dL   B-Globulin erPl Elph-Mcnc 1.0 0.7 - 1.3 g/dL   Gamma Glob erPl Elph-Mcnc 2.1 (H) 0.4 - 1.8 g/dL   M Protein erPl  Elph-Mcnc Not Observed Not Observed g/dL   Globulin, Total 4.2 (H) 2.2 - 3.9 g/dL   Albumin/Glob erPl 0.9 0.7 - 1.7   IFE 1 Comment (A)     Comment: Polyclonal increase detected in one or more immunoglobulins.   Please Note Comment     Comment: (NOTE) Protein electrophoresis scan will follow via computer, mail, or courier delivery. Performed At: [facility] [address] [place], [place] [id number] Jolene chimke MD [id number]   Kappa/lambda light chains     tatus: Abnormal   Collection Time: 08/05/22  1:22 PM  Result Value Ref Range   Kappa free light chain 201.2 (H) 3.3 - 19.4 mg/L   Lambda free light chains 34.8 (H) 5.7 - 26.3 mg/L   Kappa, lambda light chain ratio 5.78 (H) 0.26 - 1.65    Comment: (NOTE) Performed At: [facility] [address] [place], [place] [id number] Jolene chimke MD [id number]   CBC with Differential/Platelet     tatus: Abnormal   Collection Time: 08/05/22  1:22 PM  Result Value Ref Range   WBC 8.5 4.0 - 10.5 K/uL   RBC 5.79 4.22 - 5.81 MIL/uL   Hemoglobin 16.7 13.0 - 17.0 g/dL   HCT 25.3 66.4 - 40.3 %   MCV 89.6 80.0 - 100.0 fL   MCH 28.8 26.0 - 34.0 pg   MCHC 32.2 30.0 - 36.0 g/dL   RDW 47.4 (H) 25.9 - 56.3 %   Platelets 238 150 - 400 K/uL   nRBC 0.0 0.0 - 0.2 %   Neutrophils Relative % 75 %   Neutro Abs 6.5 1.7 - 7.7 K/uL   Lymphocytes Relative 13 %   Lymphs Abs 1.1 0.7 - 4.0 K/uL   Monocytes Relative 8 %   Monocytes Absolute 0.7 0.1 - 1.0 K/uL   Eosinophils Relative 3 %   Eosinophils Absolute 0.2 0.0 - 0.5 K/uL   Basophils Relative 0 %   Basophils Absolute 0.0 0.0 - 0.1 K/uL   Immature Granulocytes 1 %   Abs Immature Granulocytes 0.04 0.00 - 0.07 K/uL    Comment: Performed at [facility], [address] [place], [place] [address]  Comprehensive metabolic panel     tatus: Abnormal   Collection Time: 08/05/22  1:22 PM  Result Value Ref Range   odium 137 135 - 145 mmol/L   Potassium 4.7 3.5 - 5.1  mmol/L   Chloride 109 98 - 111 mmol/L   CO2 20 (L) 22 - 32 mmol/L  Glucose, Bld 104 (H) 70 - 99 mg/dL    Comment: Glucose reference range applies only to samples taken after fasting for at least 8 hours.   BUN 23 8 - 23 mg/dL   Creatinine, Ser 1.61 (H) 0.61 - 1.24 mg/dL   Calcium 9.1 8.9 - 09.6 mg/dL   Total Protein 8.4 (H) 6.5 - 8.1 g/dL   Albumin 3.6 3.5 - 5.0 g/dL   AST 17 15 - 41 U/L   ALT 13 0 - 44 U/L   Alkaline Phosphatase 70 38 - 126 U/L   Total Bilirubin 0.5 0.3 - 1.2 mg/dL   GFR, Estimated 50 (L) >60 mL/min    Comment: (NOTE) Calculated using the CKD-EPI Creatinine Equation (2021)    Anion gap 8 5 - 15    Comment: Performed at [facility], [address] [place], [place] [address]  Comprehensive metabolic panel     Status: Abnormal   Collection Time: 10/07/22  1:53 PM  Result Value Ref Range   Glucose 100 (H) 70 - 99 mg/dL   BUN 28 (H) 8 - 27 mg/dL   Creatinine, Ser 9.81 (H) 0.76 - 1.27 mg/dL   eGFR 43 (L) >19 JY/NWG/9.56   BUN/Creatinine Ratio 17 10 - 24   Sodium 141 134 - 144 mmol/L   Potassium 4.5 3.5 - 5.2 mmol/L   Chloride 106 96 - 106 mmol/L   CO2 21 20 - 29 mmol/L   Calcium 9.7 8.6 - 10.2 mg/dL   Total Protein 7.5 6.0 - 8.5 g/dL   Albumin 4.0 3.8 - 4.8 g/dL   Globulin, Total 3.5 1.5 - 4.5 g/dL   Albumin/Globulin Ratio 1.1 (L) 1.2 - 2.2   Bilirubin Total 0.4 0.0 - 1.2 mg/dL   Alkaline Phosphatase 82 44 - 121 IU/L   AST 16 0 - 40 IU/L   ALT 14 0 - 44 IU/L  Lipid Panel w/o Chol/HDL Ratio     Status: None   Collection Time: 10/07/22  1:53 PM  Result Value Ref Range   Cholesterol, Total 147 100 - 199 mg/dL   Triglycerides 96 0 - 149 mg/dL   HDL 50 >21 mg/dL   VLDL Cholesterol Cal 18 5 - 40 mg/dL   LDL Chol Calc (NIH) 79 0 - 99 mg/dL  Hgb H0Q w/o eAG     Status: Abnormal   Collection Time: 10/07/22  1:53 PM  Result Value Ref Range   Hgb A1c MFr Bld 6.3 (H) 4.8 - 5.6 %    Comment:          Prediabetes: 5.7 - 6.4          Diabetes: >6.4           Glycemic control for adults with diabetes: <7.0   POCT Glucose (CBG)     Status: Normal   Collection Time: 10/12/22 10:12 AM  Result Value Ref Range   POC Glucose 93 70 - 99 mg/dl      Assessment & Plan:   Problem List Items Addressed This Visit       Cardiovascular and Mediastinum   PAD (peripheral artery disease) (HCC)     Digestive   Fatty liver disease, nonalcoholic   Relevant Orders   CBC With Diff/Platelet   Comprehensive metabolic panel     Endocrine   Diabetes mellitus (HCC) - Primary   Relevant Orders   POCT Glucose (CBG) (Completed)   Hemoglobin A1c     Other   HLD (hyperlipidemia)  Relevant Orders   Lipid panel   CK   Other Visit Diagnoses     Seasonal allergic rhinitis due to pollen       Relevant Medications   Azelastine HCl 137 MCG/SPRAY SOLN       Return in about 3 months (around 01/12/2023) for awv with labs prior.   Total time spent: 30 minutes  Luna Fuse, MD  10/12/2022

## 2022-10-20 ENCOUNTER — Ambulatory Visit: Payer: Medicare PPO | Admitting: Urology

## 2022-10-20 ENCOUNTER — Other Ambulatory Visit: Payer: Self-pay | Admitting: Cardiovascular Disease

## 2022-10-20 VITALS — BP 107/73 | HR 88 | Ht 71.0 in | Wt 203.0 lb

## 2022-10-20 DIAGNOSIS — Z8546 Personal history of malignant neoplasm of prostate: Secondary | ICD-10-CM

## 2022-10-20 DIAGNOSIS — N138 Other obstructive and reflux uropathy: Secondary | ICD-10-CM | POA: Diagnosis not present

## 2022-10-20 DIAGNOSIS — N401 Enlarged prostate with lower urinary tract symptoms: Secondary | ICD-10-CM

## 2022-10-20 DIAGNOSIS — R310 Gross hematuria: Secondary | ICD-10-CM

## 2022-10-20 LAB — URINALYSIS, COMPLETE
Bilirubin, UA: NEGATIVE
Ketones, UA: NEGATIVE
Leukocytes,UA: NEGATIVE
Nitrite, UA: NEGATIVE
Protein,UA: NEGATIVE
RBC, UA: NEGATIVE
Specific Gravity, UA: 1.01 (ref 1.005–1.030)
Urobilinogen, Ur: 0.2 mg/dL (ref 0.2–1.0)
pH, UA: 5.5 (ref 5.0–7.5)

## 2022-10-20 LAB — MICROSCOPIC EXAMINATION

## 2022-10-20 LAB — BLADDER SCAN AMB NON-IMAGING: Scan Result: 15

## 2022-10-20 NOTE — Progress Notes (Signed)
Marcelle Overlie Plume,acting as a scribe for Vanna Scotland, MD.,have documented all relevant documentation on the behalf of Vanna Scotland, MD,as directed by  Vanna Scotland, MD while in the presence of Vanna Scotland, MD.  10/20/2022 11:34 AM   Omar [name] 05/03/1941 [id number]  Referring provider: Sherron Monday, MD [address] [place],  [place] [address]  Chief Complaint  Patient presents with   Follow-up    HPI: 82 year-old male who returns today for routine annual follow up.   He has a personal history of BPH with obstruction, CKD, and gross hematuria. He also has a personal history of prostate cancer, status post IMRT in 2015.   He had a hematuria workup on 09/11/2021 that showed some abnormalities, felt to be possible changes related to radiation. He did proceed to the operating room for cystoscopy and bladder biopsies. Surgical pathology was consistent with cystitis cystica with reactive atypia, but no malignancy.   His most recent PSA on 08/05/2022 was 0.10 and stable.  His urinalysis today is negative.  Today, he denies any hematuria. He reports urgency and occasional incontinence but does not find it bothersome.  When he does not take his Flomax, he can tell a difference.  Results for orders placed or performed in visit on 10/20/22  BLADDER SCAN AMB NON-IMAGING  Result Value Ref Range   Scan Result 15 ml     IPSS     Row Name 10/20/22 1000         International Prostate Symptom Score   How often have you had the sensation of not emptying your bladder? Less than 1 in 5     How often have you had to urinate less than every two hours? About half the time     How often have you found you stopped and started again several times when you urinated? Not at All     How often have you found it difficult to postpone urination? Not at All     How often have you had a weak urinary stream? Not at All     How often have you had to strain to start urination? Not at All      How many times did you typically get up at night to urinate? 1 Time     Total IPSS Score 5       Quality of Life due to urinary symptoms   If you were to spend the rest of your life with your urinary condition just the way it is now how would you feel about that? Mixed              Score:  1-7 Mild 8-19 Moderate 20-35 Severe    PMH: Past Medical History:  Diagnosis Date   AAA (abdominal aortic aneurysm) (HCC)    a.) s/p EVAR 11/05/2013.   Anxiety    a.) on BZO (alprazolam) PRN   Aortic atherosclerosis (HCC)    Atrial fibrillation (HCC)    a.) CHA2DS2-VASc = 6 (age x2, CHF, HTN, PVD/aortic plaque, T2DM). b.) rate/rhythm maintained without use of pharmacological intervention; chronically anticoagulated using dose reduced apixaban.   BPH (benign prostatic hyperplasia)    CHF (congestive heart failure) (HCC)    a.)  TTE 03/16/2021: EF 63%; moderate LVH; mild BAE mild to moderate pan valvular regurgitation; G1DD.   CKD (chronic kidney disease), stage III (HCC)    Colon polyp 07/07/2015   TUBULAR ADENOMA WITH AT LEAST HIGH-GRADE / Dr Shelle Iron  COPD (chronic obstructive pulmonary disease) (HCC)    Coronary artery disease    a.) coronary CTA 01/10/2015 CA score 722.3; mod-sev LAD disease; mild LCx disease; 50-60% RCA disease.   Current use of long term anticoagulation    a.) dose reduced apixaban   Diverticulosis    Erectile dysfunction    GERD (gastroesophageal reflux disease)    Gout    Heart murmur    Hepatic cyst    Hepatic steatosis    Hepatitis    Hypercholesterolemia    Hypertension    Iron deficiency anemia    Migraines    Multiple myeloma (HCC)    Nephrolithiasis    OSA on CPAP    Presence of permanent cardiac pacemaker    Prostate cancer (HCC) 01/01/13, 01/30/14   Gleason 3+4=7, volume 46.6 cc   PVD (peripheral vascular disease) (HCC)    Renal cyst    Rheumatoid arthritis (HCC)    S/P radiation therapy  04/03/2014 through 06/04/2014                                                       Prostate 7800 cGy in 40 sessions                           SSS (sick sinus syndrome) (HCC)    a.) s/p Medtronic Azure PPM placement 08/26/2016   T2DM (type 2 diabetes mellitus) (HCC)    Third degree heart block (HCC) 08/26/2016   a.) s/p PPM placement (Medtronic Azure XT DR MRI [id number] S/N: [id number]) on 08/26/2016   Vitamin D deficiency     Surgical History: Past Surgical History:  Procedure Laterality Date   ABDOMINAL AORTIC ANEURYSM REPAIR  11/05/2013   COLON SURGERY  08/06/2015   Right hemicolectomy for tubulovillous adenoma with high-grade dysplasia.   COLONOSCOPY WITH PROPOFOL N/A 07/07/2015   Procedure: COLONOSCOPY WITH PROPOFOL;  Surgeon: Elnita Maxwell, MD;  Location: [facility] ENDOSCOPY;  Service: Endoscopy;  Laterality: N/A;   COLONOSCOPY WITH PROPOFOL N/A 10/28/2016   Procedure: COLONOSCOPY WITH PROPOFOL;  Surgeon: Wyline Mood, MD;  Location: [facility] ENDOSCOPY;  Service: Endoscopy;  Laterality: N/A;   CYSTOSCOPY W/ RETROGRADES Bilateral 11/16/2021   Procedure: CYSTOSCOPY WITH RETROGRADE PYELOGRAM;  Surgeon: Vanna Scotland, MD;  Location: [facility] ORS;  Service: Urology;  Laterality: Bilateral;   CYSTOSCOPY WITH BIOPSY N/A 11/16/2021   Procedure: CYSTOSCOPY WITH BLADDER BIOPSY;  Surgeon: Vanna Scotland, MD;  Location: [facility] ORS;  Service: Urology;  Laterality: N/A;   ESOPHAGOGASTRODUODENOSCOPY (EGD) WITH PROPOFOL N/A 10/28/2016   Procedure: ESOPHAGOGASTRODUODENOSCOPY (EGD) WITH PROPOFOL;  Surgeon: Wyline Mood, MD;  Location: [facility] ENDOSCOPY;  Service: Endoscopy;  Laterality: N/A;   GIVENS CAPSULE STUDY N/A 07/25/2017   Procedure: GIVENS CAPSULE STUDY;  Surgeon: Wyline Mood, MD;  Location: [facility] ENDOSCOPY;  Service: Gastroenterology;  Laterality: N/A;   GIVENS CAPSULE STUDY N/A 01/29/2019   Procedure: GIVENS CAPSULE STUDY;  Surgeon: Wyline Mood, MD;  Location: [facility] ENDOSCOPY;  Service: Gastroenterology;  Laterality: N/A;   LAPAROSCOPIC RIGHT COLECTOMY Right  08/08/2015   Procedure: LAPAROSCOPIC RIGHT COLECTOMY;  Surgeon: Earline Mayotte, MD;  Location: [facility] ORS;  Service: General;  Laterality: Right;   NASAL SINUS SURGERY     PACEMAKER INSERTION Left 08/26/2016   Procedure: INSERTION PACEMAKER;  Surgeon: Marcina Millard, MD;  Location: [facility] ORS;  Service: Cardiovascular;  Laterality: Left;   PROSTATE BIOPSY  01/01/13, 01/30/14   Gleason 3+3=6, vol 46.6 cc   TONSILLECTOMY     uvula surgery     for sleep apnea    Home Medications:  Allergies as of 10/20/2022   No Known Allergies      Medication List        Accurate as of Oct 20, 2022 11:34 AM. If you have any questions, ask your nurse or doctor.          albuterol 108 (90 Base) MCG/ACT inhaler Commonly known as: VENTOLIN HFA Inhale 1 puff into the lungs every 6 (six) hours as needed.   allopurinol 100 MG tablet Commonly known as: ZYLOPRIM Take 100 mg by mouth daily.   ALPRAZolam 0.25 MG tablet Commonly known as: XANAX Take by mouth 2 (two) times daily as needed.   atorvastatin 80 MG tablet Commonly known as: LIPITOR Take 80 mg by mouth at bedtime.   Azelastine HCl 137 MCG/SPRAY Soln Place 1 spray into the nose daily.   benazepril 10 MG tablet Commonly known as: LOTENSIN Take 10 mg by mouth daily.   Breo Ellipta 100-25 MCG/ACT Aepb Generic drug: fluticasone furoate-vilanterol INHALE 1 PUFF BY MOUTH ONCE DAILY AS DIRECTED   Eliquis 2.5 MG Tabs tablet Generic drug: apixaban Take 1 tablet by mouth twice daily   ezetimibe 10 MG tablet Commonly known as: ZETIA Take 1 tablet by mouth once daily   fluticasone 50 MCG/ACT nasal spray Commonly known as: FLONASE Place 1 spray into both nostrils daily as needed for rhinitis.   gabapentin 400 MG capsule Commonly known as: NEURONTIN Take 400 mg by mouth 3 (three) times daily.   guaiFENesin 600 MG 12 hr tablet Commonly known as: Mucinex Take 1 tablet (600 mg total) by mouth 2 (two) times daily for 10 days.    Humira (2 Pen) 40 MG/0.4ML Pnkt Generic drug: Adalimumab Inject 40 mg into the skin every 14 (fourteen) days.   isosorbide mononitrate 60 MG 24 hr tablet Commonly known as: IMDUR Take 1 tablet by mouth once daily   Jardiance 25 MG Tabs tablet Generic drug: empagliflozin TAKE 1 TABLET BY MOUTH ONCE DAILY IN THE MORNING   metoprolol succinate 25 MG 24 hr tablet Commonly known as: TOPROL-XL Take 1 tablet by mouth once daily   pantoprazole 20 MG tablet Commonly known as: PROTONIX Take 20 mg by mouth daily.   tamsulosin 0.4 MG Caps capsule Commonly known as: FLOMAX Take 1 capsule (0.4 mg total) by mouth daily.   traMADol-acetaminophen 37.5-325 MG tablet Commonly known as: ULTRACET TAKE 1 TABLET BY MOUTH EVERY 6 HOURS AS NEEDED FOR PAIN   triamcinolone cream 0.1 % Commonly known as: KENALOG Apply 1 Application topically 2 (two) times daily. to affected area   Vitamin D3 1.25 MG (50000 UT) Caps Take 1 capsule by mouth once a week        Family History: Family History  Problem Relation Age of Onset   Heart attack Mother    Cirrhosis Father    Pancreatic cancer Brother    Diabetes Brother    Diabetes Daughter        medication induced for cancer treatments   Cancer Daughter        breast, brain    Social History:  reports that he has been smoking cigarettes. He has a 60.00 pack-year smoking history. He has never used smokeless tobacco. He reports current alcohol use of  about 1.0 standard drink of alcohol per week. He reports that he does not use drugs.   Physical Exam: BP 107/73   Pulse 88   Ht 5\' 11"  (1.803 m)   Wt 203 lb (92.1 kg)   BMI 28.31 kg/m   Constitutional:  Alert and oriented, No acute distress. HEENT: [place] AT, moist mucus membranes.  Trachea midline, no masses. Neurologic: Grossly intact, no focal deficits, moving all 4 extremities. Psychiatric: Normal mood and affect.   Assessment & Plan:    1. History of prostate cancer - PSA is stable -  Plan to continue annual monitoring.  2. Gross hematuria - Status post work-up last year including biopsies which were unremarkable  - No current episodes. Encourage patient to report if symptoms recur. -UA negative today  3. BPH with LUTs - Currently on Flomax with improvement in symptoms. - Recommended that he continue Flomax indefinitely to control symptoms.   Return in about 1 year (around 10/20/2023) for IPSS, PVR, PSA, UA.  I have reviewed the above documentation for accuracy and completeness, and I agree with the above.   Vanna Scotland, MD   [facility] [address], [address] [place], [place] [address] [phone]

## 2022-10-24 ENCOUNTER — Other Ambulatory Visit: Payer: Self-pay | Admitting: Internal Medicine

## 2022-10-26 ENCOUNTER — Other Ambulatory Visit: Payer: Self-pay | Admitting: Internal Medicine

## 2022-11-02 ENCOUNTER — Other Ambulatory Visit: Payer: Self-pay | Admitting: Internal Medicine

## 2022-11-15 ENCOUNTER — Other Ambulatory Visit: Payer: Self-pay

## 2022-11-15 ENCOUNTER — Other Ambulatory Visit: Payer: Self-pay | Admitting: Internal Medicine

## 2022-11-16 MED ORDER — TRAMADOL-ACETAMINOPHEN 37.5-325 MG PO TABS: 1.0000 | ORAL_TABLET | Freq: Four times a day (QID) | ORAL | 0 refills | Status: DC | PRN

## 2022-11-23 DIAGNOSIS — Z79899 Other long term (current) drug therapy: Secondary | ICD-10-CM | POA: Diagnosis not present

## 2022-11-23 DIAGNOSIS — M0579 Rheumatoid arthritis with rheumatoid factor of multiple sites without organ or systems involvement: Secondary | ICD-10-CM | POA: Diagnosis not present

## 2022-11-24 ENCOUNTER — Other Ambulatory Visit: Payer: Self-pay | Admitting: Cardiovascular Disease

## 2022-12-03 ENCOUNTER — Other Ambulatory Visit: Payer: Self-pay | Admitting: Internal Medicine

## 2022-12-15 ENCOUNTER — Other Ambulatory Visit: Payer: Self-pay | Admitting: Internal Medicine

## 2022-12-16 ENCOUNTER — Inpatient Hospital Stay: Payer: Medicare PPO | Attending: Oncology

## 2022-12-16 DIAGNOSIS — Z79899 Other long term (current) drug therapy: Secondary | ICD-10-CM | POA: Diagnosis not present

## 2022-12-16 DIAGNOSIS — C61 Malignant neoplasm of prostate: Secondary | ICD-10-CM | POA: Insufficient documentation

## 2022-12-16 DIAGNOSIS — F17210 Nicotine dependence, cigarettes, uncomplicated: Secondary | ICD-10-CM | POA: Diagnosis not present

## 2022-12-16 DIAGNOSIS — R911 Solitary pulmonary nodule: Secondary | ICD-10-CM | POA: Insufficient documentation

## 2022-12-16 DIAGNOSIS — Z79631 Long term (current) use of antimetabolite agent: Secondary | ICD-10-CM | POA: Diagnosis not present

## 2022-12-16 DIAGNOSIS — N183 Chronic kidney disease, stage 3 unspecified: Secondary | ICD-10-CM | POA: Diagnosis not present

## 2022-12-16 DIAGNOSIS — D472 Monoclonal gammopathy: Secondary | ICD-10-CM | POA: Diagnosis not present

## 2022-12-16 DIAGNOSIS — M069 Rheumatoid arthritis, unspecified: Secondary | ICD-10-CM | POA: Insufficient documentation

## 2022-12-16 DIAGNOSIS — C9000 Multiple myeloma not having achieved remission: Secondary | ICD-10-CM | POA: Diagnosis not present

## 2022-12-16 LAB — CBC WITH DIFFERENTIAL (CANCER CENTER ONLY)
Abs Immature Granulocytes: 0.06 10*3/uL (ref 0.00–0.07)
Basophils Absolute: 0 10*3/uL (ref 0.0–0.1)
Basophils Relative: 1 %
Eosinophils Absolute: 0.3 10*3/uL (ref 0.0–0.5)
Eosinophils Relative: 3 %
HCT: 46.3 % (ref 39.0–52.0)
Hemoglobin: 14.7 g/dL (ref 13.0–17.0)
Immature Granulocytes: 1 %
Lymphocytes Relative: 14 %
Lymphs Abs: 1.1 10*3/uL (ref 0.7–4.0)
MCH: 27.6 pg (ref 26.0–34.0)
MCHC: 31.7 g/dL (ref 30.0–36.0)
MCV: 87 fL (ref 80.0–100.0)
Monocytes Absolute: 0.6 10*3/uL (ref 0.1–1.0)
Monocytes Relative: 7 %
Neutro Abs: 6 10*3/uL (ref 1.7–7.7)
Neutrophils Relative %: 74 %
Platelet Count: 189 10*3/uL (ref 150–400)
RBC: 5.32 MIL/uL (ref 4.22–5.81)
RDW: 20 % — ABNORMAL HIGH (ref 11.5–15.5)
WBC Count: 8.1 10*3/uL (ref 4.0–10.5)
nRBC: 0 % (ref 0.0–0.2)

## 2022-12-16 LAB — CMP (CANCER CENTER ONLY)
ALT: 14 U/L (ref 0–44)
AST: 15 U/L (ref 15–41)
Albumin: 3.6 g/dL (ref 3.5–5.0)
Alkaline Phosphatase: 65 U/L (ref 38–126)
Anion gap: 10 (ref 5–15)
BUN: 31 mg/dL — ABNORMAL HIGH (ref 8–23)
CO2: 20 mmol/L — ABNORMAL LOW (ref 22–32)
Calcium: 9 mg/dL (ref 8.9–10.3)
Chloride: 110 mmol/L (ref 98–111)
Creatinine: 1.59 mg/dL — ABNORMAL HIGH (ref 0.61–1.24)
GFR, Estimated: 44 mL/min — ABNORMAL LOW (ref >60)
Glucose, Bld: 97 mg/dL (ref 70–99)
Potassium: 4.4 mmol/L (ref 3.5–5.1)
Sodium: 140 mmol/L (ref 135–145)
Total Bilirubin: 0.5 mg/dL (ref 0.3–1.2)
Total Protein: 7.5 g/dL (ref 6.5–8.1)

## 2022-12-17 LAB — KAPPA/LAMBDA LIGHT CHAINS
Kappa free light chain: 163.3 mg/L — ABNORMAL HIGH (ref 3.3–19.4)
Kappa, lambda light chain ratio: 5.15 — ABNORMAL HIGH (ref 0.26–1.65)
Lambda free light chains: 31.7 mg/L — ABNORMAL HIGH (ref 5.7–26.3)

## 2022-12-20 ENCOUNTER — Ambulatory Visit (INDEPENDENT_AMBULATORY_CARE_PROVIDER_SITE_OTHER): Payer: Medicare PPO | Admitting: Podiatry

## 2022-12-20 ENCOUNTER — Encounter: Payer: Self-pay | Admitting: Podiatry

## 2022-12-20 VITALS — BP 92/54 | HR 62

## 2022-12-20 DIAGNOSIS — M2012 Hallux valgus (acquired), left foot: Secondary | ICD-10-CM

## 2022-12-20 DIAGNOSIS — M2011 Hallux valgus (acquired), right foot: Secondary | ICD-10-CM

## 2022-12-20 DIAGNOSIS — E119 Type 2 diabetes mellitus without complications: Secondary | ICD-10-CM

## 2022-12-20 DIAGNOSIS — I739 Peripheral vascular disease, unspecified: Secondary | ICD-10-CM | POA: Diagnosis not present

## 2022-12-20 DIAGNOSIS — M79609 Pain in unspecified limb: Secondary | ICD-10-CM | POA: Diagnosis not present

## 2022-12-20 DIAGNOSIS — B351 Tinea unguium: Secondary | ICD-10-CM

## 2022-12-20 DIAGNOSIS — E1140 Type 2 diabetes mellitus with diabetic neuropathy, unspecified: Secondary | ICD-10-CM

## 2022-12-20 DIAGNOSIS — S90411A Abrasion, right great toe, initial encounter: Secondary | ICD-10-CM

## 2022-12-20 MED ORDER — MUPIROCIN 2 % EX OINT
TOPICAL_OINTMENT | CUTANEOUS | 1 refills | Status: DC
Start: 2022-12-20 — End: 2023-02-07

## 2022-12-21 LAB — MULTIPLE MYELOMA PANEL, SERUM
Albumin SerPl Elph-Mcnc: 3.4 g/dL (ref 2.9–4.4)
Albumin/Glob SerPl: 1 (ref 0.7–1.7)
Alpha 1: 0.3 g/dL (ref 0.0–0.4)
Alpha2 Glob SerPl Elph-Mcnc: 0.7 g/dL (ref 0.4–1.0)
B-Globulin SerPl Elph-Mcnc: 1.1 g/dL (ref 0.7–1.3)
Gamma Glob SerPl Elph-Mcnc: 1.7 g/dL (ref 0.4–1.8)
Globulin, Total: 3.7 g/dL (ref 2.2–3.9)
IgA: 239 mg/dL (ref 61–437)
IgG (Immunoglobin G), Serum: 1716 mg/dL — ABNORMAL HIGH (ref 603–1613)
IgM (Immunoglobulin M), Srm: 107 mg/dL (ref 15–143)
Total Protein ELP: 7.1 g/dL (ref 6.0–8.5)

## 2022-12-23 ENCOUNTER — Inpatient Hospital Stay: Payer: Medicare PPO | Admitting: Oncology

## 2022-12-23 ENCOUNTER — Encounter: Payer: Self-pay | Admitting: Podiatry

## 2022-12-23 NOTE — Progress Notes (Signed)
ANNUAL DIABETIC FOOT EXAM  Subjective: Omar [name] presents today annual diabetic foot exam.  Chief Complaint  Patient presents with   Diabetes    "Toenails" Dr. Ellsworth Lennox - 10/12/22, Glucose then was 93 mg/dl, don't check himself   Patient confirms h/o diabetes.  Patient denies any h/o foot wounds.  Patient has been diagnosed with neuropathy.  Risk factors: diabetes, PAD, HTN, CAD, CHF, CKD, hyperlipidemia, heavy tobacco user, RA.  Sherron Monday, MD is patient's PCP.  Past Medical History:  Diagnosis Date   AAA (abdominal aortic aneurysm) (HCC)    a.) s/p EVAR 11/05/2013.   Anxiety    a.) on BZO (alprazolam) PRN   Aortic atherosclerosis (HCC)    Atrial fibrillation (HCC)    a.) CHA2DS2-VASc = 6 (age x2, CHF, HTN, PVD/aortic plaque, T2DM). b.) rate/rhythm maintained without use of pharmacological intervention; chronically anticoagulated using dose reduced apixaban.   BPH (benign prostatic hyperplasia)    CHF (congestive heart failure) (HCC)    a.)  TTE 03/16/2021: EF 63%; moderate LVH; mild BAE mild to moderate pan valvular regurgitation; G1DD.   CKD (chronic kidney disease), stage III (HCC)    Colon polyp 07/07/2015   TUBULAR ADENOMA WITH AT LEAST HIGH-GRADE / Dr Shelle Iron   COPD (chronic obstructive pulmonary disease) (HCC)    Coronary artery disease    a.) coronary CTA 01/10/2015 CA score 722.3; mod-sev LAD disease; mild LCx disease; 50-60% RCA disease.   Current use of long term anticoagulation    a.) dose reduced apixaban   Diverticulosis    Erectile dysfunction    GERD (gastroesophageal reflux disease)    Gout    Heart murmur    Hepatic cyst    Hepatic steatosis    Hepatitis    Hypercholesterolemia    Hypertension    Iron deficiency anemia    Migraines    Multiple myeloma (HCC)    Nephrolithiasis    OSA on CPAP    Presence of permanent cardiac pacemaker    Prostate cancer (HCC) 01/01/13, 01/30/14   Gleason 3+4=7, volume 46.6 cc   PVD (peripheral  vascular disease) (HCC)    Renal cyst    Rheumatoid arthritis (HCC)    S/P radiation therapy  04/03/2014 through 06/04/2014                                                      Prostate 7800 cGy in 40 sessions                           SSS (sick sinus syndrome) (HCC)    a.) s/p Medtronic Azure PPM placement 08/26/2016   T2DM (type 2 diabetes mellitus) (HCC)    Third degree heart block (HCC) 08/26/2016   a.) s/p PPM placement (Medtronic Azure XT DR MRI [id number] S/N: [id number]) on 08/26/2016   Vitamin D deficiency    Patient Active Problem List   Diagnosis Date Noted   Rheumatoid arthritis involving multiple sites with positive rheumatoid factor (HCC) 04/21/2022   Unintentional weight loss 04/05/2022   Secondary erythrocytosis 08/06/2021   Anemia 04/29/2020   Hepatitis 04/29/2020   Calculus of kidney 10/15/2019   Pleural effusion, bilateral 03/10/2018   Mitral valve disorder 02/08/2018   Congestive heart failure (CHF) (HCC) 08/31/2017   GERD (gastroesophageal reflux disease)  08/31/2017   Cervical radiculopathy 06/20/2017   Episodic cluster headache 06/20/2017   Male erectile disorder 06/20/2017   Polyneuropathy 06/20/2017   Goals of care, counseling/discussion 05/02/2017   Smoldering multiple myeloma 04/19/2017   Iron deficiency anemia 03/14/2017   PAD (peripheral artery disease) (HCC) 11/23/2016   Tobacco use disorder 11/23/2016   AAA (abdominal aortic aneurysm) without rupture (HCC) 11/23/2016   Acute diastolic CHF (congestive heart failure) (HCC) 08/27/2016   AVB (atrioventricular block) 08/27/2016   Cardiac pacemaker 08/27/2016   Leukocytosis 08/27/2016   Acute posthemorrhagic anemia 08/27/2016   Iron deficiency anemia due to chronic blood loss 08/27/2016   Acute on chronic renal insufficiency 08/27/2016   Joint pain 08/27/2016   Gout attack 08/27/2016   Disorder of kidney and ureter 08/27/2016   Upper GI bleed 08/21/2016   Acute subendocardial infarction, subsequent  episode of care (HCC) 04/08/2016   Bradyarrhythmia 02/06/2016   Allergic state 12/16/2015   Anxiety 12/16/2015   Atrial fibrillation (HCC) 12/16/2015   Diabetes mellitus (HCC) 12/16/2015   HLD (hyperlipidemia) 12/16/2015   BP (high blood pressure) 12/16/2015   Fatty liver disease, nonalcoholic 12/16/2015   Apnea, sleep 12/16/2015   Other chronic nonalcoholic liver disease 12/16/2015   Tubulovillous adenoma polyp of colon 08/19/2015   Benign neoplasm of colon 08/19/2015   Shortness of breath 08/19/2015   Hernia of abdominal cavity 07/31/2015   Coronary atherosclerosis of native coronary artery 03/31/2015   Malignant neoplasm of prostate (HCC) 02/20/2014   Rheumatoid arthritis, unspecified (HCC) 01/15/2014   History of rheumatoid arthritis 01/15/2014   Degeneration of cervical intervertebral disc 02/11/2011   Polyarthropathy or polyarthritis 02/11/2011   Past Surgical History:  Procedure Laterality Date   ABDOMINAL AORTIC ANEURYSM REPAIR  11/05/2013   COLON SURGERY  08/06/2015   Right hemicolectomy for tubulovillous adenoma with high-grade dysplasia.   COLONOSCOPY WITH PROPOFOL N/A 07/07/2015   Procedure: COLONOSCOPY WITH PROPOFOL;  Surgeon: Elnita Maxwell, MD;  Location: [facility] ENDOSCOPY;  Service: Endoscopy;  Laterality: N/A;   COLONOSCOPY WITH PROPOFOL N/A 10/28/2016   Procedure: COLONOSCOPY WITH PROPOFOL;  Surgeon: Wyline Mood, MD;  Location: [facility] ENDOSCOPY;  Service: Endoscopy;  Laterality: N/A;   CYSTOSCOPY W/ RETROGRADES Bilateral 11/16/2021   Procedure: CYSTOSCOPY WITH RETROGRADE PYELOGRAM;  Surgeon: Vanna Scotland, MD;  Location: [facility] ORS;  Service: Urology;  Laterality: Bilateral;   CYSTOSCOPY WITH BIOPSY N/A 11/16/2021   Procedure: CYSTOSCOPY WITH BLADDER BIOPSY;  Surgeon: Vanna Scotland, MD;  Location: [facility] ORS;  Service: Urology;  Laterality: N/A;   ESOPHAGOGASTRODUODENOSCOPY (EGD) WITH PROPOFOL N/A 10/28/2016   Procedure: ESOPHAGOGASTRODUODENOSCOPY (EGD) WITH  PROPOFOL;  Surgeon: Wyline Mood, MD;  Location: [facility] ENDOSCOPY;  Service: Endoscopy;  Laterality: N/A;   GIVENS CAPSULE STUDY N/A 07/25/2017   Procedure: GIVENS CAPSULE STUDY;  Surgeon: Wyline Mood, MD;  Location: [facility] ENDOSCOPY;  Service: Gastroenterology;  Laterality: N/A;   GIVENS CAPSULE STUDY N/A 01/29/2019   Procedure: GIVENS CAPSULE STUDY;  Surgeon: Wyline Mood, MD;  Location: [facility] ENDOSCOPY;  Service: Gastroenterology;  Laterality: N/A;   LAPAROSCOPIC RIGHT COLECTOMY Right 08/08/2015   Procedure: LAPAROSCOPIC RIGHT COLECTOMY;  Surgeon: Earline Mayotte, MD;  Location: [facility] ORS;  Service: General;  Laterality: Right;   NASAL SINUS SURGERY     PACEMAKER INSERTION Left 08/26/2016   Procedure: INSERTION PACEMAKER;  Surgeon: Marcina Millard, MD;  Location: [facility] ORS;  Service: Cardiovascular;  Laterality: Left;   PROSTATE BIOPSY  01/01/13, 01/30/14   Gleason 3+3=6, vol 46.6 cc   TONSILLECTOMY     uvula  surgery     for sleep apnea   Current Outpatient Medications on File Prior to Visit  Medication Sig Dispense Refill   albuterol (PROVENTIL HFA;VENTOLIN HFA) 108 (90 Base) MCG/ACT inhaler Inhale 1 puff into the lungs every 6 (six) hours as needed.     allopurinol (ZYLOPRIM) 100 MG tablet Take 1 tablet by mouth once daily 90 tablet 3   ALPRAZolam (XANAX) 0.25 MG tablet Take by mouth 2 (two) times daily as needed.     atorvastatin (LIPITOR) 80 MG tablet TAKE 1 TABLET BY MOUTH AT BEDTIME 90 tablet 1   Azelastine HCl 137 MCG/SPRAY SOLN Place 1 spray into the nose daily. 30 mL 2   benazepril (LOTENSIN) 10 MG tablet Take 1 tablet by mouth once daily 90 tablet 0   ELIQUIS 2.5 MG TABS tablet Take 1 tablet by mouth twice daily 180 tablet 0   ezetimibe (ZETIA) 10 MG tablet Take 1 tablet by mouth once daily 90 tablet 1   fluticasone (FLONASE) 50 MCG/ACT nasal spray Place 1 spray into both nostrils daily as needed for rhinitis.     fluticasone furoate-vilanterol (BREO ELLIPTA) 100-25 MCG/ACT AEPB  INHALE 1 PUFF BY MOUTH ONCE DAILY AS DIRECTED 60 each 03   gabapentin (NEURONTIN) 400 MG capsule Take 400 mg by mouth 3 (three) times daily.     HUMIRA PEN 40 MG/0.4ML PNKT Inject 40 mg into the skin every 14 (fourteen) days.     isosorbide mononitrate (IMDUR) 60 MG 24 hr tablet Take 1 tablet by mouth once daily 90 tablet 0   JARDIANCE 25 MG TABS tablet TAKE 1 TABLET BY MOUTH ONCE DAILY IN THE MORNING 90 tablet 0   metoprolol succinate (TOPROL-XL) 25 MG 24 hr tablet Take 1 tablet by mouth once daily 90 tablet 0   pantoprazole (PROTONIX) 20 MG tablet Take 1 tablet by mouth once daily 90 tablet 0   tamsulosin (FLOMAX) 0.4 MG CAPS capsule Take 1 capsule (0.4 mg total) by mouth daily. 90 capsule 0   traMADol-acetaminophen (ULTRACET) 37.5-325 MG tablet TAKE 1 TABLET BY MOUTH EVERY 6 HOURS AS NEEDED FOR PAIN 30 tablet 0   triamcinolone cream (KENALOG) 0.1 % Apply 1 Application topically 2 (two) times daily. to affected area     VITAMIN D3 1.25 MG (50000 UT) capsule Take 1 capsule by mouth once a week 12 capsule 0   No current facility-administered medications on file prior to visit.    No Known Allergies Social History   Occupational History   Not on file  Tobacco Use   Smoking status: Every Day    Current packs/day: 1.00    Average packs/day: 1 pack/day for 60.0 years (60.0 ttl pk-yrs)    Types: Cigarettes   Smokeless tobacco: Never   Tobacco comments:    DOWN TO 1/2 PPD  Vaping Use   Vaping status: Never Used  Substance and Sexual Activity   Alcohol use: Yes    Alcohol/week: 1.0 standard drink of alcohol    Types: 1 Cans of beer per week    Comment: occasionally   Drug use: No   Sexual activity: Not Currently   Family History  Problem Relation Age of Onset   Heart attack Mother    Cirrhosis Father    Pancreatic cancer Brother    Diabetes Brother    Diabetes Daughter        medication induced for cancer treatments   Cancer Daughter        breast, brain  Immunization  History  Administered Date(s) Administered   Influenza, High Dose Seasonal PF 02/02/2018, 02/08/2019   Influenza-Unspecified 02/24/2018   PFIZER(Purple Top)SARS-COV-2 Vaccination 06/25/2019, 07/16/2019   Tdap 10/03/2017   Zoster Recombinant(Shingrix) 03/06/2018     Review of Systems: Negative except as noted in the HPI.   Objective: Vitals:   12/20/22 1020  BP: (!) 92/54  Pulse: 62    Omar [name] is a pleasant 82 y.o. male in NAD. AAO X 3.  Vascular Examination: CFT <4 seconds b/l. DP pulses diminished b/l. PT pulses diminished b/l. Digital hair absent. Skin temperature gradient warm to cool b/l. No ischemia or gangrene. No cyanosis or clubbing noted b/l.    Neurological Examination: Sensation grossly intact b/l with 10 gram monofilament. Vibratory sensation intact b/l.   Dermatological Examination: Pedal skin thin, shiny and atrophic b/l. No open wounds. No interdigital macerations.   Toenails 1-5 b/l thick, discolored, elongated with subungual debris and pain on dorsal palpation.   Patient has abrasion on right great toe. No erythema, no edema, no drainage, no fluctuance.  Musculoskeletal Examination: Muscle strength 5/5 to b/l LE. Hallux hammertoe noted b/l LE. Hammertoe(s) noted to the R 2nd toe.  Radiographs: None  Last A1c:      Latest Ref Rng & Units 10/07/2022    1:53 PM  Hemoglobin A1C  Hemoglobin-A1c 4.8 - 5.6 % 6.3    ADA Risk Categorization: High Risk  Patient has one or more of the following: Loss of protective sensation Absent pedal pulses Severe Foot deformity History of foot ulcer  Assessment: 1. Pain due to onychomycosis of nail   2. Abrasion of right great toe, initial encounter   3. Hallux valgus, acquired, bilateral   4. PVD (peripheral vascular disease) (HCC)   5. Type 2 diabetes mellitus with diabetic neuropathy, unspecified whether long term insulin use (HCC)   6. Encounter for diabetic foot exam (HCC)     Plan:  Meds ordered  this encounter  Medications   mupirocin ointment (BACTROBAN) 2 %    Sig: Apply to right great toe once daily until healed.    Dispense:  66 g    Refill:  1   -Patient was evaluated and treated. All patient's and/or POA's questions/concerns answered on today's visit. -Rx for Mupirocin Ointment to be applied to right great toe once daily until healed. Call office if he has any problems. -Diabetic foot examination performed today. -Continue diabetic foot care principles: inspect feet daily, monitor glucose as recommended by PCP and/or Endocrinologist, and follow prescribed diet per PCP, Endocrinologist and/or dietician. -Patient/POA educated on dangers of using sharp instrumentation on toes/feet. Recommended continued professional foot care in presence of diabetes and PAD. Patient/POA relates understanding. -Patient to continue soft, supportive shoe gear daily. -Mycotic toenails 1-5 bilaterally were debrided in length and girth with sterile nail nippers and dremel. Pinpoint bleeding of L 3rd toe addressed with Lumicain Hemostatic Solution, cleansed with alcohol. triple antibiotic ointment applied. No further treatment required by patient/caregiver. -Patient/POA to call should there be question/concern in the interim. Return in about 10 weeks (around 02/28/2023).  Freddie Breech, DPM

## 2022-12-24 ENCOUNTER — Other Ambulatory Visit: Payer: Self-pay | Admitting: Cardiovascular Disease

## 2022-12-25 ENCOUNTER — Other Ambulatory Visit: Payer: Self-pay | Admitting: Internal Medicine

## 2022-12-29 ENCOUNTER — Inpatient Hospital Stay (HOSPITAL_BASED_OUTPATIENT_CLINIC_OR_DEPARTMENT_OTHER): Payer: Medicare PPO | Admitting: Oncology

## 2022-12-29 ENCOUNTER — Encounter: Payer: Self-pay | Admitting: Oncology

## 2022-12-29 VITALS — BP 112/70 | HR 80 | Temp 97.1°F | Resp 18 | Wt 208.2 lb

## 2022-12-29 DIAGNOSIS — C61 Malignant neoplasm of prostate: Secondary | ICD-10-CM | POA: Diagnosis not present

## 2022-12-29 DIAGNOSIS — Z79631 Long term (current) use of antimetabolite agent: Secondary | ICD-10-CM | POA: Diagnosis not present

## 2022-12-29 DIAGNOSIS — F17210 Nicotine dependence, cigarettes, uncomplicated: Secondary | ICD-10-CM | POA: Diagnosis not present

## 2022-12-29 DIAGNOSIS — C9000 Multiple myeloma not having achieved remission: Secondary | ICD-10-CM | POA: Diagnosis not present

## 2022-12-29 DIAGNOSIS — D472 Monoclonal gammopathy: Secondary | ICD-10-CM

## 2022-12-29 DIAGNOSIS — F17200 Nicotine dependence, unspecified, uncomplicated: Secondary | ICD-10-CM

## 2022-12-29 DIAGNOSIS — N183 Chronic kidney disease, stage 3 unspecified: Secondary | ICD-10-CM | POA: Diagnosis not present

## 2022-12-29 DIAGNOSIS — R911 Solitary pulmonary nodule: Secondary | ICD-10-CM | POA: Diagnosis not present

## 2022-12-29 DIAGNOSIS — Z79899 Other long term (current) drug therapy: Secondary | ICD-10-CM | POA: Diagnosis not present

## 2022-12-29 DIAGNOSIS — M069 Rheumatoid arthritis, unspecified: Secondary | ICD-10-CM | POA: Diagnosis not present

## 2022-12-29 NOTE — Assessment & Plan Note (Addendum)
Smoldering kappa light chain multiple myeloma/active myeloma if previous hypermetabolic lesion on PET scan was related to myeloma.Patient was treated with 4 cycles of RVD.  He is not currently on any maintenance therapy due to multiple comorbidities, borderline smoldering multiple myeloma status.    Multiple myeloma panel has been monitored and results are stable, improved, likely due to steroid use.  His kidney function is stable, no anemia.  No hypercalcemia. Consider repeat bone marrow biopsy if he starts to show signs of organ damage

## 2022-12-29 NOTE — Progress Notes (Signed)
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  He is not currently on any maintenance therapy due to multiple comorbidities, borderline smoldering multiple myeloma status.    Multiple myeloma panel has been monitored and results are stable, improved, likely due to steroid use.  His kidney function is stable, no anemia.  No hypercalcemia. Consider repeat bone marrow biopsy if he starts to show signs of organ damage  Malignant neoplasm of prostate (HCC) PSA is stable.    Tobacco use disorder Encourage his smoke cessation effort.  Erythrocytosis has resolved.   Lung nodule Repeat CT chest in Sept 2024  Orders Placed This Encounter  Procedures   CBC with Differential (Cancer Center Only)    Standing Status:   Future    Standing Expiration Date:   12/29/2023   CMP (Cancer Center only)    Standing Status:   Future    Standing Expiration Date:   12/29/2023   Multiple Myeloma Panel (SPEP&IFE w/QIG)    Standing Status:   Future    Standing Expiration Date:   12/29/2023   Kappa/lambda light chains    Standing Status:   Future    Standing Expiration Date:   12/29/2023   Follow up in 4 months.  All questions were answered. The patient knows to call the clinic with any problems, questions or concerns.  Rickard Patience, MD, PhD [facility] Hematology Oncology 12/29/2022     HISTORY OF PRESENTING ILLNESS:  82 y.o.  male with PMH listed below who presents to follow up on the evaluation and management of his abnormal urine protein electrophoresis results. I reviewed the records from [facility], [facility] and [facility] was performed and  HemOnc related medical problems are listed below.  He lives with a friend. He has 3 adult children.   1 Chronic Kidney disease, Stage III: patient follows up with Dr.Lateef.  2 Proteinuria:  02/18/2017: Albumin/Creatinine ratio was 7, Urine protein electrophoresis,Random Urine revealed M Spike 43.2%, total protein of 43.7mg /dl,  06/11/7827 Albumin/Creatinine ratio was 455.2, Albumin 996.5, , 2.15.2018 Albumin/Creatinine ratio was 80.3, Albumin 73.3, Urine protein electrophoresis,Random Urine revealed M Spike 29.7%, total protein of 29.1mg /dl, 5/62/1308 Serum protein electrophoresis did not detect M spike.  Autoimmune disorder work up showed Negative anti dsDNA, RNP antibodies, smith antibody, Sjogren antibodies, anti Jo-1, positive antichromotin antibodies, positive ANA,  3 Anemia of chronic kidney disease:  4 Iron deficiency anemia: history of iron deficiency, ferritin was 7 on 08/11/2016. He had EGD and colonoscopy that were done this year which showed duodenitis/esphagitis/diverticulosis/benigh polyps. # small bowel AVMs was ablated at [facility].    Rheumatoid arthritis: he follows up with Rheumatology and is on chronic MTX and Humaria  reports joint pains are controlled, not quite symptomatic.   Patient denies any persistent bone pain or any pain. He continue to feel lack of energy, persistent, not improved with resting or taking naps. He also has lost 25 pounds in the past year which was unintentional.    # It is ambiguous whether he has truly symptomatic multiple myeloma or a smoldering myeloma, given that anemia can be secondary to iron deficiency and CKD.  The hypermetabolic 2.1 cm hypermetabolic soft tissue lesion in the  left heel, can reflect a Plasmacytoma,which should have been biopsied. However patient initially denies being on any blood thinner, later he was found to be on Plavix with pre biopsy questionnaire screening, and biopsy was held. Patient has to contact cardiologist to hold plavix  for seven days before procedure. He feels frustrated about multiple workup and meanwhile he becomes more fatigues, with worsening of kidney function. Patient was reluctant about proceeding additional invasive procedures and wants to start treatment. Decision was made to start active MM treatment.   # He was evaluated by [facility] bone marrow transplant team for autologous bone marrow transplant. He is not considered to  good candidate for transplant. Patient cardiologist Dr. Park Breed had started patient on Lasix 20 mg daily. Lasix was discontinued by [facility] bone marrow transplant team as patient was having dizziness and borderline blood pressure during his clinic visit there.  10/02/2018 He had follow-up left foot/ankle x-ray as a follow-up of his left heel lesion which was initially presented on PET scan and not detectable on follow-up PET scan.  Images were reviewed by me.  No suspicious acute or subacute osseous abnormality  Current Treatment:  S/p RVD x 4 cycles tolerates well except cytopenia.  Revlimid 10 mg PO once per day on days 1 to 14 (dose reduced due to GFR) Revlimid renal dosing of 10mg ,  Velcade 1.3 mg/m2 IV once per day on days 1,  8, 15 Dexamethasone 20 mg PO once per day on days 1, 8, 15 given patient's diabetes Given patient's multiple comorbidity and the side effects of cytopenia during his RVD treatment and the uncertainty of this is really a site of plasmacytoma or not, I recommend not to continue him on maintenance Revlimid.   # repeat small bowel capsule study on 02/16/2019 Which showed multiple AVMs seen in the mid jejunum proximal to the site of prior tattoo. # Patient was referred to [facility] and he is status post 06/19/2019 double-balloon enteroscopy with findings of 5 AVMs in the jejunum and 2 AVMs in the duodenum and AVMs were ablated with APC.  Tattoo was seen in the area. If further bleeding, recommend to consider treatment with octreotide. Patient takes Protonix daily.    INTERVAL  HISTORY Patient presents for follow-up of management of multiple myeloma/smoldering multiple myeloma and iron deficiency, secondary erythrocytosis. Patient continues to smoke.  RA, he is on Humaria and methotrexate.  He recently took a course of tapering prednisone.  Patient has sleep apnea, he uses CPAP machine as much as he can, approximately 2-3 times per week. He has no new complaints.    Review of Systems  Constitutional:  Positive for fatigue. Negative for appetite change, chills, fever and unexpected weight change.  HENT:   Negative for hearing loss and voice change.   Eyes:  Negative for eye problems and icterus.  Respiratory:  Negative for chest tightness, cough and shortness of breath.   Cardiovascular:  Negative for chest pain and leg swelling.  Gastrointestinal:  Negative for abdominal distention, abdominal pain and diarrhea.  Endocrine: Negative for hot flashes.  Genitourinary:  Negative for difficulty urinating, dysuria and frequency.   Musculoskeletal:  Positive for arthralgias.  Skin:  Negative for itching and rash.  Neurological:  Negative for light-headedness and numbness.  Hematological:  Negative for adenopathy. Does not bruise/bleed easily.  Psychiatric/Behavioral:  Negative for confusion.      MEDICAL HISTORY:  Past Medical History:  Diagnosis Date   AAA (abdominal aortic aneurysm) (HCC)    a.) s/p  EVAR 11/05/2013.   Anxiety    a.) on BZO (alprazolam) PRN   Aortic atherosclerosis (HCC)    Atrial fibrillation (HCC)    a.) CHA2DS2-VASc = 6 (age x2, CHF, HTN, PVD/aortic plaque, T2DM). b.) rate/rhythm maintained without use of pharmacological intervention; chronically anticoagulated using dose reduced apixaban.   BPH (benign prostatic hyperplasia)    CHF (congestive heart failure) (HCC)    a.)  TTE 03/16/2021: EF 63%; moderate LVH; mild BAE mild to moderate pan valvular regurgitation; G1DD.   CKD (chronic kidney disease), stage III (HCC)    Colon polyp  07/07/2015   TUBULAR ADENOMA WITH AT LEAST HIGH-GRADE / Dr Shelle Iron   COPD (chronic obstructive pulmonary disease) (HCC)    Coronary artery disease    a.) coronary CTA 01/10/2015 CA score 722.3; mod-sev LAD disease; mild LCx disease; 50-60% RCA disease.   Current use of long term anticoagulation    a.) dose reduced apixaban   Diverticulosis    Erectile dysfunction    GERD (gastroesophageal reflux disease)    Gout    Heart murmur    Hepatic cyst    Hepatic steatosis    Hepatitis    Hypercholesterolemia    Hypertension    Iron deficiency anemia    Migraines    Multiple myeloma (HCC)    Nephrolithiasis    OSA on CPAP    Presence of permanent cardiac pacemaker    Prostate cancer (HCC) 01/01/13, 01/30/14   Gleason 3+4=7, volume 46.6 cc   PVD (peripheral vascular disease) (HCC)    Renal cyst    Rheumatoid arthritis (HCC)    S/P radiation therapy  04/03/2014 through 06/04/2014                                                      Prostate 7800 cGy in 40 sessions                           SSS (sick sinus syndrome) (HCC)    a.) s/p Medtronic Azure PPM placement 08/26/2016   T2DM (type 2 diabetes mellitus) (HCC)    Third degree heart block (HCC) 08/26/2016   a.) s/p PPM placement (Medtronic Azure XT DR MRI [id number] S/N: [id number]) on 08/26/2016   Vitamin D deficiency     SURGICAL HISTORY: Past Surgical History:  Procedure Laterality Date   ABDOMINAL AORTIC ANEURYSM REPAIR  11/05/2013   COLON SURGERY  08/06/2015   Right hemicolectomy for tubulovillous adenoma with high-grade dysplasia.   COLONOSCOPY WITH PROPOFOL N/A 07/07/2015   Procedure: COLONOSCOPY WITH PROPOFOL;  Surgeon: Elnita Maxwell, MD;  Location: [facility] ENDOSCOPY;  Service: Endoscopy;  Laterality: N/A;   COLONOSCOPY WITH PROPOFOL N/A 10/28/2016   Procedure: COLONOSCOPY WITH PROPOFOL;  Surgeon: Wyline Mood, MD;  Location: [facility] ENDOSCOPY;  Service: Endoscopy;  Laterality: N/A;   CYSTOSCOPY W/ RETROGRADES Bilateral 11/16/2021    Procedure: CYSTOSCOPY WITH RETROGRADE PYELOGRAM;  Surgeon: Vanna Scotland, MD;  Location: [facility] ORS;  Service: Urology;  Laterality: Bilateral;   CYSTOSCOPY WITH BIOPSY N/A 11/16/2021   Procedure: CYSTOSCOPY WITH BLADDER BIOPSY;  Surgeon: Vanna Scotland, MD;  Location: [facility] ORS;  Service: Urology;  Laterality: N/A;   ESOPHAGOGASTRODUODENOSCOPY (EGD) WITH PROPOFOL N/A 10/28/2016   Procedure: ESOPHAGOGASTRODUODENOSCOPY (EGD) WITH PROPOFOL;  Surgeon: Wyline Mood, MD;  Location: [facility] ENDOSCOPY;  Service: Endoscopy;  Laterality: N/A;   GIVENS CAPSULE STUDY N/A 07/25/2017   Procedure: GIVENS CAPSULE STUDY;  Surgeon: Wyline Mood, MD;  Location: [facility] ENDOSCOPY;  Service: Gastroenterology;  Laterality: N/A;   GIVENS CAPSULE STUDY N/A 01/29/2019   Procedure: GIVENS CAPSULE STUDY;  Surgeon: Wyline Mood, MD;  Location: [facility] ENDOSCOPY;  Service: Gastroenterology;  Laterality: N/A;   LAPAROSCOPIC RIGHT COLECTOMY Right 08/08/2015   Procedure: LAPAROSCOPIC RIGHT COLECTOMY;  Surgeon: Earline Mayotte, MD;  Location: [facility] ORS;  Service: General;  Laterality: Right;   NASAL SINUS SURGERY     PACEMAKER INSERTION Left 08/26/2016   Procedure: INSERTION PACEMAKER;  Surgeon: Marcina Millard, MD;  Location: [facility] ORS;  Service: Cardiovascular;  Laterality: Left;   PROSTATE BIOPSY  01/01/13, 01/30/14   Gleason 3+3=6, vol 46.6 cc   TONSILLECTOMY     uvula surgery     for sleep apnea    SOCIAL HISTORY: Social History   Socioeconomic History   Marital status: Divorced    Spouse name: Not on file   Number of children: Not on file   Years of education: Not on file   Highest education level: Not on file  Occupational History   Not on file  Tobacco Use   Smoking status: Every Day    Current packs/day: 1.00    Average packs/day: 1 pack/day for 60.0 years (60.0 ttl pk-yrs)    Types: Cigarettes   Smokeless tobacco: Never   Tobacco comments:    DOWN TO 1/2 PPD  Vaping Use   Vaping status: Never Used   Substance and Sexual Activity   Alcohol use: Yes    Alcohol/week: 1.0 standard drink of alcohol    Types: 1 Cans of beer per week    Comment: occasionally   Drug use: No   Sexual activity: Not Currently  Other Topics Concern   Not on file  Social History Narrative   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   Social Determinants of Health   Financial Resource Strain: Not on file  Food Insecurity: No Food Insecurity (09/14/2022)   Hunger Vital Sign    Worried About Running Out of Food in the Last Year: Never true    Ran Out of Food in the Last Year: Never true  Transportation Needs: No Transportation Needs (09/14/2022)   PRAPARE - Administrator, Civil Service (Medical): No    Lack of Transportation (Non-Medical): No  Physical Activity: Not on file  Stress: Not on file  Social Connections: Not on file  Intimate Partner Violence: Not on file    FAMILY HISTORY: Family History  Problem Relation Age of Onset   Heart attack Mother    Cirrhosis Father    Pancreatic cancer Brother    Diabetes Brother    Diabetes Daughter        medication induced for cancer treatments   Cancer Daughter        breast, brain    ALLERGIES:  has No Known Allergies.  MEDICATIONS:  Current Outpatient Medications  Medication Sig Dispense Refill   albuterol (PROVENTIL HFA;VENTOLIN HFA) 108 (90 Base) MCG/ACT inhaler Inhale 1 puff into the lungs every 6 (six) hours as needed.     ALPRAZolam (XANAX) 0.25 MG tablet Take by mouth 2 (two) times daily as needed.     atorvastatin (LIPITOR) 80 MG tablet TAKE 1 TABLET BY MOUTH AT BEDTIME 90 tablet 1   Azelastine HCl 137 MCG/SPRAY SOLN Place 1 spray into the nose daily. 30 mL  2   benazepril (LOTENSIN) 10 MG tablet Take 1 tablet by mouth once daily 90 tablet 0   ELIQUIS 2.5 MG TABS tablet Take 1 tablet by mouth twice daily 180 tablet 0   ezetimibe (ZETIA) 10 MG tablet Take 1 tablet by mouth once daily 90 tablet 1   fluticasone (FLONASE) 50 MCG/ACT nasal  spray Place 1 spray into both nostrils daily as needed for rhinitis.     fluticasone furoate-vilanterol (BREO ELLIPTA) 100-25 MCG/ACT AEPB INHALE 1 PUFF BY MOUTH ONCE DAILY AS DIRECTED 60 each 03   gabapentin (NEURONTIN) 400 MG capsule Take 400 mg by mouth 3 (three) times daily.     HUMIRA PEN 40 MG/0.4ML PNKT Inject 40 mg into the skin every 14 (fourteen) days.     isosorbide mononitrate (IMDUR) 60 MG 24 hr tablet Take 1 tablet by mouth once daily 90 tablet 0   JARDIANCE 25 MG TABS tablet TAKE 1 TABLET BY MOUTH ONCE DAILY IN THE MORNING 90 tablet 0   methotrexate (RHEUMATREX) 2.5 MG tablet Take 10 mg by mouth once a week.     metoprolol succinate (TOPROL-XL) 25 MG 24 hr tablet Take 1 tablet by mouth once daily 90 tablet 0   mupirocin ointment (BACTROBAN) 2 % Apply to right great toe once daily until healed. 66 g 1   pantoprazole (PROTONIX) 20 MG tablet Take 1 tablet by mouth once daily 90 tablet 0   tamsulosin (FLOMAX) 0.4 MG CAPS capsule Take 1 capsule (0.4 mg total) by mouth daily. 90 capsule 0   traMADol-acetaminophen (ULTRACET) 37.5-325 MG tablet TAKE 1 TABLET BY MOUTH EVERY 6 HOURS AS NEEDED FOR PAIN 30 tablet 0   triamcinolone cream (KENALOG) 0.1 % Apply 1 Application topically 2 (two) times daily. to affected area     VITAMIN D3 1.25 MG (50000 UT) capsule Take 1 capsule by mouth once a week 12 capsule 0   allopurinol (ZYLOPRIM) 100 MG tablet Take 1 tablet by mouth once daily (Patient not taking: Reported on 12/29/2022) 90 tablet 3   No current facility-administered medications for this visit.      Marland Kitchen  PHYSICAL EXAMINATION: ECOG PERFORMANCE STATUS: 1 - Symptomatic but completely ambulatory Vitals:   12/29/22 0911  BP: 112/70  Pulse: 80  Resp: 18  Temp: (!) 97.1 F (36.2 C)   Filed Weights   12/29/22 0911  Weight: 208 lb 3.2 oz (94.4 kg)   Physical Exam Constitutional:      General: He is not in acute distress.    Appearance: He is not diaphoretic.  HENT:     Head:  Normocephalic and atraumatic.     Mouth/Throat:     Pharynx: No oropharyngeal exudate.  Eyes:     General: No scleral icterus.       Right eye: No discharge.        Left eye: No discharge.     Pupils: Pupils are equal, round, and reactive to light.  Neck:     Vascular: No JVD.  Cardiovascular:     Rate and Rhythm: Normal rate and regular rhythm.     Heart sounds: Normal heart sounds. No murmur heard. Pulmonary:     Effort: Pulmonary effort is normal. No respiratory distress.     Breath sounds: Normal breath sounds.  Abdominal:     General: Bowel sounds are normal. There is no distension.     Palpations: Abdomen is soft. There is no mass.     Tenderness: There is no abdominal  tenderness.  Musculoskeletal:        General: No tenderness. Normal range of motion.     Cervical back: Normal range of motion and neck supple.  Lymphadenopathy:     Cervical: No cervical adenopathy.  Skin:    General: Skin is warm and dry.     Findings: No erythema.     Comments: Hypopigmentation of skin  Neurological:     Mental Status: He is alert and oriented to person, place, and time.     Cranial Nerves: No cranial nerve deficit.  Psychiatric:        Mood and Affect: Affect normal.     LABORATORY DATA:  I have reviewed the data as listed    Latest Ref Rng & Units 12/16/2022    2:05 PM 08/05/2022    1:22 PM 04/05/2022   12:51 PM  CBC  WBC 4.0 - 10.5 K/uL 8.1  8.5  8.6   Hemoglobin 13.0 - 17.0 g/dL 72.5  36.6  44.0   Hematocrit 39.0 - 52.0 % 46.3  51.9  47.5   Platelets 150 - 400 K/uL 189  238  238       Latest Ref Rng & Units 12/16/2022    2:05 PM 10/07/2022    1:53 PM 08/05/2022    1:22 PM  CMP  Glucose 70 - 99 mg/dL 97  347  425   BUN 8 - 23 mg/dL 31  28  23    Creatinine 0.61 - 1.24 mg/dL 9.56  3.87  5.64   Sodium 135 - 145 mmol/L 140  141  137   Potassium 3.5 - 5.1 mmol/L 4.4  4.5  4.7   Chloride 98 - 111 mmol/L 110  106  109   CO2 22 - 32 mmol/L 20  21  20    Calcium 8.9 - 10.3  mg/dL 9.0  9.7  9.1   Total Protein 6.5 - 8.1 g/dL 7.5  7.5  8.4   Total Bilirubin 0.3 - 1.2 mg/dL 0.5  0.4  0.5   Alkaline Phos 38 - 126 U/L 65  82  70   AST 15 - 41 U/L 15  16  17    ALT 0 - 44 U/L 14  14  13       Bone marrow biopsy 03/21/2017  Bone Marrow, Aspirate,Biopsy, and Clot, left iliac - HYPERCELLULAR BONE MARROW FOR AGE WITH TRILINEAGE HEMATOPOIESIS. - PLASMACYTOSIS (PLASMA CELLS 8%)  Karyotype: loss of Y chromosome Cytogenetic MDS FISH Panel negative.   Bone marrow 04/06/2017  Bone Marrow, Aspirate,Biopsy, and Clot, right iliac and core - HYPERCELLULAR BONE MARROW FOR AGE WITH PLASMA CELL NEOPLASM - TRILINEAGE HEMATOPOIESIS. - SEE COMMENT. PERIPHERAL BLOOD: - MICROCYTIC-HYPOCHROMIC ANEMIA. Diagnosis Note The bone marrow is hypercellular with trilineage hematopoiesis but with relative abundance of erythroid precursors and increased number of megakaryocytes with nonspecific changes. Significant dyspoiesis is not seen. Iron stores are present with no ring sideroblasts. The plasma cells are increased in number representing 8% of all cells in the aspirate although focal areas in the core biopsy show 10 to 20% as primarily seen by CD138 stain. In situ hybridization for kappa and lambda light chains show kappa light chain restriction consistent with plasma cell neoplasm. Correlation with cytogenetic and FISH studies is recommended. (BNS:ecj/gt 04/07/2017)  IMAGE STUDIES I have personally reviewed below image results.  03/15/2017 DG bone survey Met: Nonspecific lucent lesion in the distal right ulna measuring 15-16 mm, but otherwise normal bone mineralization for age throughout the visible  skeleton. A solitary lytic lesion in a distal extremity would be an unusual presentation of multiple myeloma, and I favor the distal right ulna lesion is benign. Recommend correlation with serum and urine protein electrophoresis.  04/14/2017 PET scan: 1. 2.1 cm hypermetabolic soft tissue  lesion in the left heel, adjacent to the calcaneal tuberosity. 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 2. Mottled FDG accumulation diffusely in the marrow space without other frankly overt hypermetabolic bony lesion. 3. Coronary artery and thoracoabdominal aortic atherosclerosis with abdominal aortic stent graft visualized in situ. 4. Bilateral renal cysts and bilateral nonobstructing renal stones.   08/24/2017 PET scan 1. No hypermetabolic osseous lesions. No definite signs of multiple myeloma on CT imaging. 2. Interval improvement in soft tissue activity along the medial aspect of the left calcaneal tuberosity, likely plantar fasciitis. 3. New small left pleural effusion. 4. Bilateral inguinal hernias containing small bowel on the right and sigmoid colon on the left. No evidence of incarceration or obstruction. 5. Otherwise stable incidental findings including diffuse atherosclerosis, bilateral renal cysts, emphysema and sigmoid diverticulosis.

## 2022-12-29 NOTE — Assessment & Plan Note (Signed)
PSA is stable

## 2022-12-29 NOTE — Assessment & Plan Note (Signed)
Encourage his smoke cessation effort.  Erythrocytosis has resolved.

## 2022-12-29 NOTE — Progress Notes (Signed)
Pt here for follow up. No new concerns voiced.   

## 2022-12-29 NOTE — Assessment & Plan Note (Signed)
Repeat CT chest in Sept 2024

## 2023-01-03 DIAGNOSIS — Z79899 Other long term (current) drug therapy: Secondary | ICD-10-CM | POA: Diagnosis not present

## 2023-01-03 DIAGNOSIS — M5441 Lumbago with sciatica, right side: Secondary | ICD-10-CM | POA: Diagnosis not present

## 2023-01-03 DIAGNOSIS — G8929 Other chronic pain: Secondary | ICD-10-CM | POA: Diagnosis not present

## 2023-01-03 DIAGNOSIS — M5442 Lumbago with sciatica, left side: Secondary | ICD-10-CM | POA: Diagnosis not present

## 2023-01-03 DIAGNOSIS — C9001 Multiple myeloma in remission: Secondary | ICD-10-CM | POA: Diagnosis not present

## 2023-01-03 DIAGNOSIS — M0579 Rheumatoid arthritis with rheumatoid factor of multiple sites without organ or systems involvement: Secondary | ICD-10-CM | POA: Diagnosis not present

## 2023-01-12 ENCOUNTER — Encounter: Payer: Self-pay | Admitting: Internal Medicine

## 2023-01-12 ENCOUNTER — Ambulatory Visit (INDEPENDENT_AMBULATORY_CARE_PROVIDER_SITE_OTHER): Payer: Medicare PPO | Admitting: Internal Medicine

## 2023-01-12 VITALS — BP 129/90 | HR 78 | Ht 71.0 in | Wt 210.0 lb

## 2023-01-12 DIAGNOSIS — E119 Type 2 diabetes mellitus without complications: Secondary | ICD-10-CM

## 2023-01-12 DIAGNOSIS — Z1331 Encounter for screening for depression: Secondary | ICD-10-CM | POA: Diagnosis not present

## 2023-01-12 DIAGNOSIS — K760 Fatty (change of) liver, not elsewhere classified: Secondary | ICD-10-CM

## 2023-01-12 DIAGNOSIS — I10 Essential (primary) hypertension: Secondary | ICD-10-CM | POA: Diagnosis not present

## 2023-01-12 DIAGNOSIS — K219 Gastro-esophageal reflux disease without esophagitis: Secondary | ICD-10-CM | POA: Diagnosis not present

## 2023-01-12 DIAGNOSIS — E782 Mixed hyperlipidemia: Secondary | ICD-10-CM

## 2023-01-12 DIAGNOSIS — J301 Allergic rhinitis due to pollen: Secondary | ICD-10-CM | POA: Diagnosis not present

## 2023-01-12 LAB — POCT CBG (FASTING - GLUCOSE)-MANUAL ENTRY: Glucose Fasting, POC: 92 mg/dL (ref 70–99)

## 2023-01-12 MED ORDER — BENAZEPRIL HCL 10 MG PO TABS
10.0000 mg | ORAL_TABLET | Freq: Every day | ORAL | 0 refills | Status: DC
Start: 2023-01-12 — End: 2023-04-13

## 2023-01-12 MED ORDER — AZELASTINE HCL 137 MCG/SPRAY NA SOLN
1.0000 | Freq: Every day | NASAL | 2 refills | Status: DC
Start: 2023-01-12 — End: 2023-04-13

## 2023-01-12 MED ORDER — EMPAGLIFLOZIN 25 MG PO TABS
25.0000 mg | ORAL_TABLET | Freq: Every morning | ORAL | 0 refills | Status: DC
Start: 2023-01-12 — End: 2023-04-13

## 2023-01-12 MED ORDER — PANTOPRAZOLE SODIUM 20 MG PO TBEC
20.0000 mg | DELAYED_RELEASE_TABLET | Freq: Every day | ORAL | 0 refills | Status: DC
Start: 2023-01-12 — End: 2023-04-13

## 2023-01-12 NOTE — Progress Notes (Signed)
**Note De-Identified via Obfucation** Etablihed Patient Office Viit  ubjective:  Patient ID: Omar [name], male    DOB: 1941-04-11  Age: 82 y.o. MRN: [id number]  Chief Complaint  Patient preent with   Annual Exam    3 mo AWV with lab    No new complaint, here for AWV refer to quality metric and canned document.  Methotrexate recently added for hi RA.   No other concern at thi time.   Pat Medical Hitory:  Diagnoi Date   AAA (abdominal aortic aneurym) (HCC)    a.) /p EVAR 11/05/2013.   Anxiety    a.) on BZO (alprazolam) PRN   Aortic atherocleroi (HCC)    Atrial fibrillation (HCC)    a.) CHA2D2-VAc = 6 (age x2, CHF, HTN, PVD/aortic plaque, T2DM). b.) rate/rhythm maintained without ue of pharmacological intervention; chronically anticoagulated uing doe reduced apixaban.   BPH (benign protatic hyperplaia)    CHF (congetive heart failure) (HCC)    a.)  TTE 03/16/2021: EF 63%; moderate LVH; mild BAE mild to moderate pan valvular regurgitation; G1DD.   CKD (chronic kidney dieae), tage III (HCC)    Colon polyp 07/07/2015   TUBULAR ADENOMA WITH AT LEAT HIGH-GRADE / Dr helle Iron   COPD (chronic obtructive pulmonary dieae) (HCC)    Coronary artery dieae    a.) coronary CTA 01/10/2015 CA core 722.3; mod-ev LAD dieae; mild LCx dieae; 50-60% RCA dieae.   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/14   Gleaon 3+4=7, volume 46.6 cc   PVD (peripheral vacular dieae) (HCC)    Renal cyt    Rheumatoid arthriti (HCC)    /P radiation therapy  04/03/2014 through  06/04/2014                                                      Protate 7800 cGy in 40 eion                            (ick inu yndrome) (HCC)    a.) /p Medtronic Azure PPM placement 08/26/2016   T2DM (type 2 diabete mellitu) (HCC)    Third degree heart block (HCC) 08/26/2016   a.) /p PPM placement (Medtronic Azure XT DR MRI [id number] /N: [id number]) on 08/26/2016   Vitamin D deficiency     Pat urgical Hitory:  Procedure Laterality Date   ABDOMINAL AORTIC ANEURYM REPAIR  11/05/2013   COLON URGERY  08/06/2015   Right hemicolectomy for tubulovillou adenoma with high-grade dyplaia.   COLONOCOPY WITH PROPOFOL N/A 07/07/2015   Procedure: COLONOCOPY WITH PROPOFOL;  urgeon: Elnita Maxwell, MD;  Location: [facility] ENDOC[facility];  ervice: Endocopy;  Laterality: N/A;   COLONOCOPY WITH PROPOFOL N/A 10/28/2016   Procedure: COLONOCOPY WITH PROPOFOL;  urgeon: Wyline Mood, MD; **Note De-Identified via Obfucation** Location: [facility] ENDOCOPY;  ervice: Endocopy;  Laterality: N/A;   CYTOCOPY W/ RETROGRADE Bilateral 11/16/2021   Procedure: CYTOCOPY WITH RETROGRADE PYELOGRAM;  urgeon: Vanna cotland, MD;  Location: [facility] OR;  ervice: Urology;  Laterality: Bilateral;   CYTOCOPY WITH BIOPY N/A 11/16/2021   Procedure: CYTOCOPY WITH BLADDER BIOPY;  urgeon: Vanna cotland, MD;  Location: [facility] OR;  ervice: Urology;  Laterality: N/A;   EOPHAGOGATRODUODENOCOPY (EGD) WITH PROPOFOL N/A 10/28/2016   Procedure: EOPHAGOGATRODUODENOCOPY (EGD) WITH PROPOFOL;  urgeon: Wyline Mood, MD;  Location: [facility] ENDOCOPY;  ervice: Endocopy;  Laterality: N/A;   GIVEN CAPULE TUDY N/A 07/25/2017   Procedure: GIVEN CAPULE TUDY;  urgeon: Wyline Mood, MD;  Location: [facility] ENDOCOP[facility];  ervice: Gatroenterology;  Laterality: N/A;   GIVEN CAPULE TUDY N/A 01/29/2019   Procedure: GIVEN CAPULE TUDY;  urgeon: Wyline Mood, MD;  [facility];  ervice: Gatroenterology;  Laterality: N/A;   LAPAROCOPIC RIGHT  COLECTOMY Right 08/08/2015   Procedure: LAPAROCOPIC RIGHT COLECTOMY;  urgeon: Earline Mayotte, MD;  Location: [facility] OR;  ervice: General;  Laterality: Right;   NAAL INU URGERY     PACEMAKER INERTION Left 08/26/2016   Procedure: INERTION PACEMAKER;  urgeon: Marcina Millard, MD;  Location: [facility] OR;  ervice: Cardiovacular;  Laterality: Left;   PROTATE BIOPY  01/01/13, 01/30/14   Gleaon 3+3=6, vol 46.6 cc   TONILLECTOMY     uvula urgery     for leep apnea    ocial Hitory   ocioeconomic Hitory   Marital tatu: Divorced    poue name: Not on file   Number of children: Not on file   Year of education: Not on file   Highet education level: Not on file  Occupational Hitory   Not on file  Tobacco Ue   moking tatu: Every Day    Current pack/day: 1.00    Average pack/day: 1 pack/day for 60.0 year (60.0 ttl pk-yr)    Type: Cigarette   mokele tobacco: Never   Tobacco comment:    DOWN TO 1/2 PPD  Vaping Ue   Vaping tatu: Never Ued  ubtance and exual Activity   Alcohol ue: Ye    Alcohol/week: 1.0 tandard drink of alcohol    Type: 1 Can of beer per week    Comment: occaionally   Drug ue: No   exual activity: Not Currently  Other Topic Concern   Not on file  ocial Hitory Narrative   Take care of iter with dementia   ocial Determinant of Health   Financial Reource train: Not on file  Food Inecurity: No Food Inecurity (09/14/2022)   Hunger Vital ign    Worried About Running Out of Food in the Lat Year: Never true    Ran Out of Food in the Lat Year: Never true  Tranportation Need: No Tranportation Need (09/14/2022)   PRAPARE - Adminitrator, Civil ervice (Medical): No    Lack of Tranportation (Non-Medical): No  Phyical Activity: Not on file  tre: Not on file  ocial Connection: Not on file  Intimate Partner Violence: Not on file    Family Hitory  Problem Relation Age of Onet    Heart attack Mother    Cirrhoi Father    Alzheimer' dieae iter    Pancreatic cancer Brother    Diabete Brother    Diabete Daughter        medication induced for cancer treatment   Cancer Daughter        breat, brain **Note De-Identified via Obfucation** No Known Allergie  Review of Sytem  Contitutional: Negative.   HENT:         Naal dicharge  Eye: Negative.   Repiratory:  Poitive for cough.   Cardiovacular: Negative.   Gatrointetinal: Negative.   Genitourinary: Negative.   Muculokeletal:  Poitive for back pain.  Skin: Negative.   Neurological: Negative.   Endo/Heme/Allergie:  Poitive for environmental allergie.  Pychiatric/Behavioral: Negative.         Objective:   BP (!) 129/90   Pule 78   Ht 5\' 11"  (1.803 m)   Wt 210 lb (95.3 kg)   SpO2 98%   BMI 29.29 kg/m   Vital:   01/12/23 1012  BP: (!) 129/90  Pule: 78  Height: 5\' 11"  (1.803 m)  Weight: 210 lb (95.3 kg)  SpO2: 98%  BMI (Calculated): 29.3    Phyical Exam Vital reviewed.  Contitutional:      Appearance: Normal appearance.  HENT:     Head: Normocephalic.     Left Ear: There i no impacted cerumen.     Noe: Noe normal.     Mouth/Throat:     Mouth: Mucou membrane are moit.     Pharynx: No poterior oropharyngeal erythema.  Eye:     Extraocular Movement: Extraocular movement intact.     Pupil: Pupil are equal, round, and reactive to light.  Cardiovacular:     Rate and Rhythm: Regular rhythm.     Chet Wall: PMI i not diplaced.     Pule: Normal pule.     Heart ound: Normal heart ound. No murmur heard. Pulmonary:     Effort: Pulmonary effort i normal.     Breath ound: Normal air entry. Examination of the right-lower field reveal rale. Examination of the left-lower field reveal rale. Rale preent. No rhonchi.  Abdominal:     General: Abdomen i flat. Bowel ound are normal. There i no ditenion.     Palpation: Abdomen i oft. There i no hepatomegaly,  plenomegaly or ma.     Tenderne: There i no abdominal tenderne.  Muculokeletal:        General: Normal range of motion.     Cervical back: Normal range of motion and neck upple.     Right lower leg: No edema.     Left lower leg: No edema.  Skin:    General: Skin i warm and dry.  Neurological:     General: No focal deficit preent.     Mental Statu: He i alert and oriented to peron, place, and time.     Cranial Nerve: No cranial nerve deficit.     Motor: No weakne.  Pychiatric:        Mood and Affect: Mood normal.        Behavior: Behavior normal.      Reult for order placed or performed in viit on 01/12/23  POCT CBG (Fating - Glucoe)  Reult Value Ref Range   Glucoe Fating, POC 92 70 - 99 mg/dL    Recent Reult (from the pat 2160 hour())  Urinalyi, Complete     Statu: Abnormal   Collection Time: 10/20/22 10:15 AM  Reult Value Ref Range   Specific Gravity, UA 1.010 1.005 - 1.030   pH, UA 5.5 5.0 - 7.5   Color, UA Yellow Yellow   Appearance Ur Clear Clear   Leukocyte,UA Negative Negative   Protein,UA Negative Negative/Trace   Glucoe, UA 3+ (A) Negative   Ketone, UA Negative Negative  RBC, UA Negative Negative   Bilirubin, UA Negative Negative   Urobilinogen, Ur 0.2 0.2 - 1.0 mg/dL   Nitrite, UA Negative Negative   Microscopic Examination See below:   Microscopic Examination     Status: None   Collection Time: 10/20/22 10:15 AM   Urine  Result Value Ref Range   WBC, UA 0-5 0 - 5 /hpf   RBC, Urine 0-2 0 - 2 /hpf   Epithelial Cells (non renal) 0-10 0 - 10 /hpf   Bacteria, UA Few None seen/Few  BLADDER SCAN AMB NON-IMAGING     Status: None   Collection Time: 10/20/22 10:22 AM  Result Value Ref Range   Scan Result 15 ml   Kappa/lambda light chains     Status: Abnormal   Collection Time: 12/16/22  2:05 PM  Result Value Ref Range   Kappa free light chain 163.3 (H) 3.3 - 19.4 mg/L   Lambda free light chains 31.7 (H) 5.7 - 26.3  mg/L   Kappa, lambda light chain ratio 5.15 (H) 0.26 - 1.65    Comment: (NOTE) Performed At: [facility] [address] [place], [place] [id number] Jolene Schimke MD WU:[id number]   Multiple Myeloma Panel (SPEP&IFE w/QIG)     Status: Abnormal   Collection Time: 12/16/22  2:05 PM  Result Value Ref Range   IgG (Immunoglobin G), Serum 1,716 (H) 603 - 1,613 mg/dL   IgA 956 61 - 213 mg/dL   IgM (Immunoglobulin M), Srm 107 15 - 143 mg/dL   Total Protein ELP 7.1 6.0 - 8.5 g/dL   Albumin SerPl Elph-Mcnc 3.4 2.9 - 4.4 g/dL   Alpha 1 0.3 0.0 - 0.4 g/dL   Alpha2 Glob SerPl Elph-Mcnc 0.7 0.4 - 1.0 g/dL   B-Globulin SerPl Elph-Mcnc 1.1 0.7 - 1.3 g/dL   Gamma Glob SerPl Elph-Mcnc 1.7 0.4 - 1.8 g/dL   M Protein SerPl Elph-Mcnc Not Observed Not Observed g/dL   Globulin, Total 3.7 2.2 - 3.9 g/dL   Albumin/Glob SerPl 1.0 0.7 - 1.7   IFE 1 Comment (A)     Comment: Polyclonal increase detected in one or more immunoglobulins.   Please Note Comment     Comment: (NOTE) Protein electrophoresis scan will follow via computer, mail, or courier delivery. Performed At: [facility] [address] [place], [place] [id number] Jolene Schimke MD GE:[id number]   CMP (Cancer Center only)     Status: Abnormal   Collection Time: 12/16/22  2:05 PM  Result Value Ref Range   Sodium 140 135 - 145 mmol/L   Potassium 4.4 3.5 - 5.1 mmol/L   Chloride 110 98 - 111 mmol/L   CO2 20 (L) 22 - 32 mmol/L   Glucose, Bld 97 70 - 99 mg/dL    Comment: Glucose reference range applies only to samples taken after fasting for at least 8 hours.   BUN 31 (H) 8 - 23 mg/dL   Creatinine 0.10 (H) 2.72 - 1.24 mg/dL   Calcium 9.0 8.9 - 53.6 mg/dL   Total Protein 7.5 6.5 - 8.1 g/dL   Albumin 3.6 3.5 - 5.0 g/dL   AST 15 15 - 41 U/L   ALT 14 0 - 44 U/L   Alkaline Phosphatase 65 38 - 126 U/L   Total Bilirubin 0.5 0.3 - 1.2 mg/dL   GFR, Estimated 44 (L) >60 mL/min    Comment: (NOTE) Calculated using the CKD-EPI  Creatinine Equation (2021)    Anion gap 10 5 - 15  Comment: Performed at [facility], [address] [place], [place] [address]  CBC with Differential (Cancer Center Only)     Status: Abnormal   Collection Time: 12/16/22  2:05 PM  Result Value Ref Range   WBC Count 8.1 4.0 - 10.5 K/uL   RBC 5.32 4.22 - 5.81 MIL/uL   Hemoglobin 14.7 13.0 - 17.0 g/dL   HCT 64.3 32.9 - 51.8 %   MCV 87.0 80.0 - 100.0 fL   MCH 27.6 26.0 - 34.0 pg   MCHC 31.7 30.0 - 36.0 g/dL   RDW 84.1 (H) 66.0 - 63.0 %   Platelet Count 189 150 - 400 K/uL   nRBC 0.0 0.0 - 0.2 %   Neutrophils Relative % 74 %   Neutro Abs 6.0 1.7 - 7.7 K/uL   Lymphocytes Relative 14 %   Lymphs Abs 1.1 0.7 - 4.0 K/uL   Monocytes Relative 7 %   Monocytes Absolute 0.6 0.1 - 1.0 K/uL   Eosinophils Relative 3 %   Eosinophils Absolute 0.3 0.0 - 0.5 K/uL   Basophils Relative 1 %   Basophils Absolute 0.0 0.0 - 0.1 K/uL   Immature Granulocytes 1 %   Abs Immature Granulocytes 0.06 0.00 - 0.07 K/uL    Comment: Performed at [facility], [address] [place], [place] [address]  POCT CBG (Fasting - Glucose)     Status: None   Collection Time: 01/12/23 10:18 AM  Result Value Ref Range   Glucose Fasting, POC 92 70 - 99 mg/dL      Assessment & Plan:  As per problem list  Problem List Items Addressed This Visit       Cardiovascular and Mediastinum   BP (high blood pressure)   Relevant Medications   benazepril (LOTENSIN) 10 MG tablet     Digestive   Fatty liver disease, nonalcoholic   GERD (gastroesophageal reflux disease)   Relevant Medications   pantoprazole (PROTONIX) 20 MG tablet     Endocrine   Diabetes mellitus (HCC) - Primary   Relevant Medications   benazepril (LOTENSIN) 10 MG tablet   empagliflozin (JARDIANCE) 25 MG TABS tablet   Other Relevant Orders   POCT CBG (Fasting - Glucose) (Completed)   POC CREATINE & ALBUMIN,URINE     Other   HLD (hyperlipidemia)   Relevant Medications   benazepril  (LOTENSIN) 10 MG tablet   Other Visit Diagnoses     Seasonal allergic rhinitis due to pollen       Relevant Medications   Azelastine HCl 137 MCG/SPRAY SOLN       Return in about 2 weeks (around 01/26/2023) for fu with labs prior.   Total time spent: 40 minutes  Luna Fuse, MD  01/12/2023   This document may have been prepared by [facility] Voice Recognition software and as such may include unintentional dictation errors.

## 2023-01-14 ENCOUNTER — Other Ambulatory Visit: Payer: Medicare PPO

## 2023-01-14 DIAGNOSIS — E119 Type 2 diabetes mellitus without complications: Secondary | ICD-10-CM | POA: Diagnosis not present

## 2023-01-14 DIAGNOSIS — K760 Fatty (change of) liver, not elsewhere classified: Secondary | ICD-10-CM | POA: Diagnosis not present

## 2023-01-14 DIAGNOSIS — E782 Mixed hyperlipidemia: Secondary | ICD-10-CM | POA: Diagnosis not present

## 2023-01-16 ENCOUNTER — Other Ambulatory Visit: Payer: Self-pay | Admitting: Cardiovascular Disease

## 2023-01-21 ENCOUNTER — Other Ambulatory Visit: Payer: Self-pay | Admitting: Internal Medicine

## 2023-01-31 ENCOUNTER — Other Ambulatory Visit: Payer: Self-pay | Admitting: Internal Medicine

## 2023-02-02 ENCOUNTER — Ambulatory Visit: Payer: Medicare PPO | Admitting: Internal Medicine

## 2023-02-02 VITALS — BP 98/62 | HR 80 | Ht 71.0 in | Wt 208.0 lb

## 2023-02-02 DIAGNOSIS — E782 Mixed hyperlipidemia: Secondary | ICD-10-CM | POA: Diagnosis not present

## 2023-02-02 DIAGNOSIS — I10 Essential (primary) hypertension: Secondary | ICD-10-CM

## 2023-02-02 DIAGNOSIS — M1A40X0 Other secondary chronic gout, unspecified site, without tophus (tophi): Secondary | ICD-10-CM

## 2023-02-02 DIAGNOSIS — E119 Type 2 diabetes mellitus without complications: Secondary | ICD-10-CM | POA: Diagnosis not present

## 2023-02-02 LAB — POCT CBG (FASTING - GLUCOSE)-MANUAL ENTRY: Glucose Fasting, POC: 95 mg/dL (ref 70–99)

## 2023-02-02 MED ORDER — ALLOPURINOL 100 MG PO TABS
100.0000 mg | ORAL_TABLET | Freq: Every day | ORAL | 3 refills | Status: DC
Start: 2023-02-02 — End: 2024-04-04

## 2023-02-02 NOTE — Progress Notes (Signed)
Established Patient Office Visit  Subjective:  Patient ID: Omar [name], male    DOB: 11/03/1940  Age: 82 y.o. MRN: [id number]  No chief complaint on file.   No new complaints, here for lab review and medication refills. Labs reviewed and notable for well controlled diabetes, A1c at target with unremarkable cmp. Denies any hypoglycemic episodes and home bg readings have been at target.   No other concerns at this time.   Past Medical History:  Diagnosis Date   AAA (abdominal aortic aneurysm) (HCC)    a.) Cletus Mehlhoff/p EVAR 11/05/2013.   Anxiety    a.) on BZO (alprazolam) PRN   Aortic atherosclerosis (HCC)    Atrial fibrillation (HCC)    a.) CHA2DS2-VASc = 6 (age x2, CHF, HTN, PVD/aortic plaque, T2DM). b.) rate/rhythm maintained without use of pharmacological intervention; chronically anticoagulated using dose reduced apixaban.   BPH (benign prostatic hyperplasia)    CHF (congestive heart failure) (HCC)    a.)  TTE 03/16/2021: EF 63%; moderate LVH; mild BAE mild to moderate pan valvular regurgitation; G1DD.   CKD (chronic kidney disease), stage III (HCC)    Colon polyp 07/07/2015   TUBULAR ADENOMA WITH AT LEAST HIGH-GRADE / Dr Shelle Iron   COPD (chronic obstructive pulmonary disease) (HCC)    Coronary artery disease    a.) coronary CTA 01/10/2015 CA score 722.3; mod-sev LAD disease; mild LCx disease; 50-60% RCA disease.   Current use of long term anticoagulation    a.) dose reduced apixaban   Diverticulosis    Erectile dysfunction    GERD (gastroesophageal reflux disease)    Gout    Heart murmur    Hepatic cyst    Hepatic steatosis    Hepatitis    Hypercholesterolemia    Hypertension    Iron deficiency anemia    Migraines    Multiple myeloma (HCC)    Nephrolithiasis    OSA on CPAP    Presence of permanent cardiac pacemaker    Prostate cancer (HCC) 01/01/13, 01/30/14   Gleason 3+4=7, volume 46.6 cc   PVD (peripheral vascular disease) (HCC)    Renal cyst    Rheumatoid arthritis  (HCC)    Nejla Reasor/P radiation therapy  04/03/2014 through 06/04/2014                                                      Prostate 7800 cGy in 40 sessions                           SSS (sick sinus syndrome) (HCC)    a.) Nyra Anspaugh/p Medtronic Azure PPM placement 08/26/2016   T2DM (type 2 diabetes mellitus) (HCC)    Third degree heart block (HCC) 08/26/2016   a.) Jalik Gellatly/p PPM placement (Medtronic Azure XT DR MRI [id number] Tison Leibold/N: [id number] H) on 08/26/2016   Vitamin D deficiency     Past Surgical History:  Procedure Laterality Date   ABDOMINAL AORTIC ANEURYSM REPAIR  11/05/2013   COLON SURGERY  08/06/2015   Right hemicolectomy for tubulovillous adenoma with high-grade dysplasia.   COLONOSCOPY WITH PROPOFOL N/A 07/07/2015   Procedure: COLONOSCOPY WITH PROPOFOL;  Surgeon: Elnita Maxwell, MD;  Location: [facility] ENDOSCOPY;  Service: Endoscopy;  Laterality: N/A;   COLONOSCOPY WITH PROPOFOL N/A 10/28/2016   Procedure: COLONOSCOPY WITH PROPOFOL;  Surgeon: Wyline Mood,  MD;  Location: [facility] ENDOSCOPY;  Service: Endoscopy;  Laterality: N/A;   CYSTOSCOPY W/ RETROGRADES Bilateral 11/16/2021   Procedure: CYSTOSCOPY WITH RETROGRADE PYELOGRAM;  Surgeon: Vanna Scotland, MD;  Location: [facility] ORS;  Service: Urology;  Laterality: Bilateral;   CYSTOSCOPY WITH BIOPSY N/A 11/16/2021   Procedure: CYSTOSCOPY WITH BLADDER BIOPSY;  Surgeon: Vanna Scotland, MD;  Location: [facility] ORS;  Service: Urology;  Laterality: N/A;   ESOPHAGOGASTRODUODENOSCOPY (EGD) WITH PROPOFOL N/A 10/28/2016   Procedure: ESOPHAGOGASTRODUODENOSCOPY (EGD) WITH PROPOFOL;  Surgeon: Wyline Mood, MD;  Location: [facility] ENDOSCOPY;  Service: Endoscopy;  Laterality: N/A;   GIVENS CAPSULE STUDY N/A 07/25/2017   Procedure: GIVENS CAPSULE STUDY;  Surgeon: Wyline Mood, MD;  Location: [facility] ENDOSCOPY;  Service: Gastroenterology;  Laterality: N/A;   GIVENS CAPSULE STUDY N/A 01/29/2019   Procedure: GIVENS CAPSULE STUDY;  Surgeon: Wyline Mood, MD;  Location: [facility] ENDOSCOPY;  Service:  Gastroenterology;  Laterality: N/A;   LAPAROSCOPIC RIGHT COLECTOMY Right 08/08/2015   Procedure: LAPAROSCOPIC RIGHT COLECTOMY;  Surgeon: Earline Mayotte, MD;  Location: [facility] ORS;  Service: General;  Laterality: Right;   NASAL SINUS SURGERY     PACEMAKER INSERTION Left 08/26/2016   Procedure: INSERTION PACEMAKER;  Surgeon: Marcina Millard, MD;  Location: [facility] ORS;  Service: Cardiovascular;  Laterality: Left;   PROSTATE BIOPSY  01/01/13, 01/30/14   Gleason 3+3=6, vol 46.6 cc   TONSILLECTOMY     uvula surgery     for sleep apnea    Social History   Socioeconomic History   Marital status: Divorced    Spouse name: Not on file   Number of children: Not on file   Years of education: Not on file   Highest education level: Not on file  Occupational History   Not on file  Tobacco Use   Smoking status: Every Day    Current packs/day: 1.00    Average packs/day: 1 pack/day for 60.0 years (60.0 ttl pk-yrs)    Types: Cigarettes   Smokeless tobacco: Never   Tobacco comments:    DOWN TO 1/2 PPD  Vaping Use   Vaping status: Never Used  Substance and Sexual Activity   Alcohol use: Yes    Alcohol/week: 1.0 standard drink of alcohol    Types: 1 Cans of beer per week    Comment: occasionally   Drug use: No   Sexual activity: Not Currently  Other Topics Concern   Not on file  Social History Narrative   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   Social Determinants of Health   Financial Resource Strain: Not on file  Food Insecurity: No Food Insecurity (09/14/2022)   Hunger Vital Sign    Worried About Running Out of Food in the Last Year: Never true    Ran Out of Food in the Last Year: Never true  Transportation Needs: No Transportation Needs (09/14/2022)   PRAPARE - Administrator, Civil Service (Medical): No    Lack of Transportation (Non-Medical): No  Physical Activity: Not on file  Stress: Not on file  Social Connections: Not on file  Intimate Partner Violence: Not on file     Family History  Problem Relation Age of Onset   Heart attack Mother    Cirrhosis Father    Alzheimer'Juanisha Bautch disease Sister    Pancreatic cancer Brother    Diabetes Brother    Diabetes Daughter        medication induced for cancer treatments   Cancer Daughter        breast,  brain    No Known Allergies  Review of Systems  Constitutional: Negative.   HENT:         Nasal discharge  Eyes: Negative.   Respiratory:  Positive for cough.   Cardiovascular: Negative.   Gastrointestinal: Negative.   Genitourinary: Negative.   Musculoskeletal:  Positive for back pain.  Skin: Negative.   Neurological: Negative.   Endo/Heme/Allergies:  Positive for environmental allergies.  Psychiatric/Behavioral: Negative.         Objective:   BP 98/62   Pulse 80   Ht 5\' 11"  (1.803 m)   Wt 208 lb (94.3 kg)   SpO2 98%   BMI 29.01 kg/m   Vitals:   02/02/23 1104  BP: 98/62  Pulse: 80  Height: 5\' 11"  (1.803 m)  Weight: 208 lb (94.3 kg)  SpO2: 98%  BMI (Calculated): 29.02    Physical Exam Vitals reviewed.  Constitutional:      Appearance: Normal appearance.  HENT:     Head: Normocephalic.     Left Ear: There is no impacted cerumen.     Nose: Nose normal.     Mouth/Throat:     Mouth: Mucous membranes are moist.     Pharynx: No posterior oropharyngeal erythema.  Eyes:     Extraocular Movements: Extraocular movements intact.     Pupils: Pupils are equal, round, and reactive to light.  Cardiovascular:     Rate and Rhythm: Regular rhythm.     Chest Wall: PMI is not displaced.     Pulses: Normal pulses.     Heart sounds: Normal heart sounds. No murmur heard. Pulmonary:     Effort: Pulmonary effort is normal.     Breath sounds: Normal air entry. Examination of the right-lower field reveals rales. Examination of the left-lower field reveals rales. Rales present. No rhonchi.  Abdominal:     General: Abdomen is flat. Bowel sounds are normal. There is no distension.     Palpations:  Abdomen is soft. There is no hepatomegaly, splenomegaly or mass.     Tenderness: There is no abdominal tenderness.  Musculoskeletal:        General: Normal range of motion.     Cervical back: Normal range of motion and neck supple.     Right lower leg: No edema.     Left lower leg: No edema.  Skin:    General: Skin is warm and dry.  Neurological:     General: No focal deficit present.     Mental Status: He is alert and oriented to person, place, and time.     Cranial Nerves: No cranial nerve deficit.     Motor: No weakness.  Psychiatric:        Mood and Affect: Mood normal.        Behavior: Behavior normal.      Results for orders placed or performed in visit on 02/02/23  POCT CBG (Fasting - Glucose)  Result Value Ref Range   Glucose Fasting, POC 95 70 - 99 mg/dL    Recent Results (from the past 2160 hour(Genae Strine))  Kappa/lambda light chains     Status: Abnormal   Collection Time: 12/16/22  2:05 PM  Result Value Ref Range   Kappa free light chain 163.3 (H) 3.3 - 19.4 mg/L   Lambda free light chains 31.7 (H) 5.7 - 26.3 mg/L   Kappa, lambda light chain ratio 5.15 (H) 0.26 - 1.65    Comment: (NOTE) Performed At: [facility] [address]  [place], [place] [id number] Jolene Schimke MD QM:[id number]   Multiple Myeloma Panel (SPEP&IFE w/QIG)     Status: Abnormal   Collection Time: 12/16/22  2:05 PM  Result Value Ref Range   IgG (Immunoglobin G), Serum 1,716 (H) 603 - 1,613 mg/dL   IgA 284 61 - 132 mg/dL   IgM (Immunoglobulin M), Srm 107 15 - 143 mg/dL   Total Protein ELP 7.1 6.0 - 8.5 g/dL   Albumin SerPl Elph-Mcnc 3.4 2.9 - 4.4 g/dL   Alpha 1 0.3 0.0 - 0.4 g/dL   Alpha2 Glob SerPl Elph-Mcnc 0.7 0.4 - 1.0 g/dL   B-Globulin SerPl Elph-Mcnc 1.1 0.7 - 1.3 g/dL   Gamma Glob SerPl Elph-Mcnc 1.7 0.4 - 1.8 g/dL   M Protein SerPl Elph-Mcnc Not Observed Not Observed g/dL   Globulin, Total 3.7 2.2 - 3.9 g/dL   Albumin/Glob SerPl 1.0 0.7 - 1.7   IFE 1 Comment (A)     Comment:  Polyclonal increase detected in one or more immunoglobulins.   Please Note Comment     Comment: (NOTE) Protein electrophoresis scan will follow via computer, mail, or courier delivery. Performed At: [facility] [address] [place], [place] [id number] Jolene Schimke MD DG:[id number]   CMP (Cancer Center only)     Status: Abnormal   Collection Time: 12/16/22  2:05 PM  Result Value Ref Range   Sodium 140 135 - 145 mmol/L   Potassium 4.4 3.5 - 5.1 mmol/L   Chloride 110 98 - 111 mmol/L   CO2 20 (L) 22 - 32 mmol/L   Glucose, Bld 97 70 - 99 mg/dL    Comment: Glucose reference range applies only to samples taken after fasting for at least 8 hours.   BUN 31 (H) 8 - 23 mg/dL   Creatinine 9.56 (H) 3.87 - 1.24 mg/dL   Calcium 9.0 8.9 - 56.4 mg/dL   Total Protein 7.5 6.5 - 8.1 g/dL   Albumin 3.6 3.5 - 5.0 g/dL   AST 15 15 - 41 U/L   ALT 14 0 - 44 U/L   Alkaline Phosphatase 65 38 - 126 U/L   Total Bilirubin 0.5 0.3 - 1.2 mg/dL   GFR, Estimated 44 (L) >60 mL/min    Comment: (NOTE) Calculated using the CKD-EPI Creatinine Equation (2021)    Anion gap 10 5 - 15    Comment: Performed at [facility], [address] [place], [place] [address]  CBC with Differential (Cancer Center Only)     Status: Abnormal   Collection Time: 12/16/22  2:05 PM  Result Value Ref Range   WBC Count 8.1 4.0 - 10.5 K/uL   RBC 5.32 4.22 - 5.81 MIL/uL   Hemoglobin 14.7 13.0 - 17.0 g/dL   HCT 18.8 41.6 - 60.6 %   MCV 87.0 80.0 - 100.0 fL   MCH 27.6 26.0 - 34.0 pg   MCHC 31.7 30.0 - 36.0 g/dL   RDW 30.1 (H) 60.1 - 09.3 %   Platelet Count 189 150 - 400 K/uL   nRBC 0.0 0.0 - 0.2 %   Neutrophils Relative % 74 %   Neutro Abs 6.0 1.7 - 7.7 K/uL   Lymphocytes Relative 14 %   Lymphs Abs 1.1 0.7 - 4.0 K/uL   Monocytes Relative 7 %   Monocytes Absolute 0.6 0.1 - 1.0 K/uL   Eosinophils Relative 3 %   Eosinophils Absolute 0.3 0.0 - 0.5 K/uL   Basophils Relative 1 %   Basophils Absolute 0.0 0.0 -  0.1  K/uL   Immature Granulocytes 1 %   Abs Immature Granulocytes 0.06 0.00 - 0.07 K/uL    Comment: Performed at [facility], [address] [place], [place] [address]  POCT CBG (Fasting - Glucose)     Status: None   Collection Time: 01/12/23 10:18 AM  Result Value Ref Range   Glucose Fasting, POC 92 70 - 99 mg/dL  Comprehensive metabolic panel     Status: Abnormal   Collection Time: 01/14/23 10:39 AM  Result Value Ref Range   Glucose 91 70 - 99 mg/dL   BUN 16 8 - 27 mg/dL   Creatinine, Ser 2.84 (H) 0.76 - 1.27 mg/dL   eGFR 44 (L) >13 KG/MWN/0.27   BUN/Creatinine Ratio 10 10 - 24   Sodium 143 134 - 144 mmol/L   Potassium 4.6 3.5 - 5.2 mmol/L   Chloride 106 96 - 106 mmol/L   CO2 24 20 - 29 mmol/L   Calcium 9.6 8.6 - 10.2 mg/dL   Total Protein 7.2 6.0 - 8.5 g/dL   Albumin 3.8 3.7 - 4.7 g/dL   Globulin, Total 3.4 1.5 - 4.5 g/dL   Bilirubin Total 0.3 0.0 - 1.2 mg/dL   Alkaline Phosphatase 87 44 - 121 IU/L   AST 19 0 - 40 IU/L   ALT 16 0 - 44 IU/L  CK     Status: None   Collection Time: 01/14/23 10:41 AM  Result Value Ref Range   Total CK 85 30 - 208 U/L  Hemoglobin A1c     Status: Abnormal   Collection Time: 01/14/23 10:43 AM  Result Value Ref Range   Hgb A1c MFr Bld 6.4 (H) 4.8 - 5.6 %    Comment:          Prediabetes: 5.7 - 6.4          Diabetes: >6.4          Glycemic control for adults with diabetes: <7.0    Est. average glucose Bld gHb Est-mCnc 137 mg/dL  POCT CBG (Fasting - Glucose)     Status: None   Collection Time: 02/02/23 11:12 AM  Result Value Ref Range   Glucose Fasting, POC 95 70 - 99 mg/dL      Assessment & Plan:  As per problem list.  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   Total time spent: 20 minutes  Luna Fuse, MD  02/02/2023   This document may have been prepared by [facility] Voice Recognition software and as such may include unintentional dictation errors.

## 2023-02-07 ENCOUNTER — Other Ambulatory Visit: Payer: Self-pay | Admitting: Podiatry

## 2023-02-07 DIAGNOSIS — S90411A Abrasion, right great toe, initial encounter: Secondary | ICD-10-CM

## 2023-02-10 ENCOUNTER — Ambulatory Visit
Admission: RE | Admit: 2023-02-10 | Discharge: 2023-02-10 | Disposition: A | Discharge status: Home / Self Care | Payer: Medicare PPO | Source: Ambulatory Visit | Attending: Oncology | Admitting: Oncology

## 2023-02-10 DIAGNOSIS — J432 Centrilobular emphysema: Secondary | ICD-10-CM | POA: Diagnosis not present

## 2023-02-10 DIAGNOSIS — C9000 Multiple myeloma not having achieved remission: Secondary | ICD-10-CM | POA: Diagnosis not present

## 2023-02-10 DIAGNOSIS — J929 Pleural plaque without asbestos: Secondary | ICD-10-CM | POA: Diagnosis not present

## 2023-02-10 DIAGNOSIS — R918 Other nonspecific abnormal finding of lung field: Secondary | ICD-10-CM | POA: Insufficient documentation

## 2023-02-18 ENCOUNTER — Other Ambulatory Visit: Payer: Self-pay | Admitting: Internal Medicine

## 2023-02-18 ENCOUNTER — Other Ambulatory Visit: Payer: Self-pay | Admitting: Cardiovascular Disease

## 2023-03-16 ENCOUNTER — Other Ambulatory Visit: Payer: Self-pay | Admitting: Internal Medicine

## 2023-03-17 ENCOUNTER — Encounter: Payer: Self-pay | Admitting: Podiatry

## 2023-03-17 ENCOUNTER — Ambulatory Visit: Payer: Medicare PPO | Admitting: Podiatry

## 2023-03-17 DIAGNOSIS — M79609 Pain in unspecified limb: Secondary | ICD-10-CM | POA: Diagnosis not present

## 2023-03-17 DIAGNOSIS — Z79899 Other long term (current) drug therapy: Secondary | ICD-10-CM | POA: Diagnosis not present

## 2023-03-17 DIAGNOSIS — L84 Corns and callosities: Secondary | ICD-10-CM

## 2023-03-17 DIAGNOSIS — M0579 Rheumatoid arthritis with rheumatoid factor of multiple sites without organ or systems involvement: Secondary | ICD-10-CM | POA: Diagnosis not present

## 2023-03-17 DIAGNOSIS — B351 Tinea unguium: Secondary | ICD-10-CM | POA: Diagnosis not present

## 2023-03-17 DIAGNOSIS — I739 Peripheral vascular disease, unspecified: Secondary | ICD-10-CM

## 2023-03-17 DIAGNOSIS — E1140 Type 2 diabetes mellitus with diabetic neuropathy, unspecified: Secondary | ICD-10-CM

## 2023-03-21 ENCOUNTER — Other Ambulatory Visit: Payer: Self-pay | Admitting: Cardiovascular Disease

## 2023-03-21 NOTE — Progress Notes (Signed)
Subjective:  Patient ID: Omar [name], male    DOB: [birth year],  MRN: [id number]  82 y.o. male presents to clinic with  at risk footcare. Patient has h/o diabetes, neuropathy and PAD and is seen for  and painful elongated mycotic toenails 1-5 bilaterally which are tender when wearing enclosed shoe gear. Pain is relieved with periodic professional debridement. Patient states both great toes are bothering him. States right great toe abrasion healed uneventfully. Chief Complaint  Patient presents with   Diabetes    DIABETIC BLOOD THINNERS     PCP is Sherron Monday, MD.  No Known Allergies  Review of Systems: Negative except as noted in the HPI.   Objective:  Omar [name] is a pleasant 82 y.o. male in NAD.Marland Kitchen  Vascular Examination: CFT <4 seconds b/l. DP pulses diminished b/l. PT pulses diminished b/l. Digital hair absent. Skin temperature gradient warm to cool b/l. No ischemia or gangrene. No cyanosis or clubbing noted b/l.    Neurological Examination: Sensation grossly intact b/l with 10 gram monofilament. Vibratory sensation intact b/l. Pt has subjective symptoms of neuropathy.  Dermatological Examination: Pedal skin thin, shiny and atrophic b/l. No open wounds. No interdigital macerations.   Toenails 1-5 b/l thick, discolored, elongated with subungual debris and pain on dorsal palpation.   Incurvated nailplate both borders of left hallux and both borders of right hallux.  Nail border hypertrophy absent. There is tenderness to palpation. Sign(s) of infection: no clinical signs of infection noted on examination today.. Hyperkeratotic lesion(s) distal tip of right 2nd toe.  No erythema, no edema, no drainage, no fluctuance.  Musculoskeletal Examination: Muscle strength 5/5 to all lower extremity muscle groups bilaterally. Hammertoe right 2nd toe.  Radiographs: None  Last A1c:      Latest Ref Rng & Units 01/14/2023   10:43 AM 10/07/2022    1:53 PM  Hemoglobin A1C   Hemoglobin-A1c 4.8 - 5.6 % 6.4  6.3     Assessment:   1. Pain due to onychomycosis of nail   2. Corns   3. PAD (peripheral artery disease) (HCC)   4. Type 2 diabetes mellitus with diabetic neuropathy, unspecified whether long term insulin use (HCC)    Plan:  -Consent given for treatment as described below: -Examined patient. -Continue foot and shoe inspections daily. Monitor blood glucose per PCP/Endocrinologist's recommendations. -Continue supportive shoe gear daily. -Toenails 1-5 b/l were debrided in length and girth with sterile nail nippers and dremel without iatrogenic bleeding.  -No invasive procedure(s) performed. Offending nail border debrided and curretaged bilateral great toes utilizing sterile nail nipper and currette. Border cleansed with alcohol and triple antibiotic ointment applied. No further treatment required by patient/caregiver. Call office if there are any concerns. -Corn(s) R 2nd toe pared utilizing sterile scalpel blade without complication or incident. Total number debrided=1. -Patient/POA to call should there be question/concern in the interim.  Return in about 3 months (around 06/17/2023).  Freddie Breech, DPM

## 2023-04-01 ENCOUNTER — Other Ambulatory Visit: Payer: Self-pay | Admitting: Internal Medicine

## 2023-04-07 ENCOUNTER — Other Ambulatory Visit: Payer: Self-pay

## 2023-04-07 MED ORDER — VITAMIN D3 1.25 MG (50000 UT) PO CAPS: 50000.0000 [IU] | ORAL_CAPSULE | ORAL | 0 refills | Status: DC

## 2023-04-13 ENCOUNTER — Ambulatory Visit: Payer: Medicare PPO | Admitting: Internal Medicine

## 2023-04-13 ENCOUNTER — Encounter: Payer: Self-pay | Admitting: Internal Medicine

## 2023-04-13 ENCOUNTER — Other Ambulatory Visit: Payer: Self-pay

## 2023-04-13 VITALS — BP 129/74 | HR 75 | Ht 71.0 in | Wt 203.0 lb

## 2023-04-13 DIAGNOSIS — F17210 Nicotine dependence, cigarettes, uncomplicated: Secondary | ICD-10-CM

## 2023-04-13 DIAGNOSIS — E782 Mixed hyperlipidemia: Secondary | ICD-10-CM

## 2023-04-13 DIAGNOSIS — J301 Allergic rhinitis due to pollen: Secondary | ICD-10-CM | POA: Diagnosis not present

## 2023-04-13 DIAGNOSIS — E119 Type 2 diabetes mellitus without complications: Secondary | ICD-10-CM

## 2023-04-13 DIAGNOSIS — I10 Essential (primary) hypertension: Secondary | ICD-10-CM

## 2023-04-13 DIAGNOSIS — I739 Peripheral vascular disease, unspecified: Secondary | ICD-10-CM

## 2023-04-13 DIAGNOSIS — K219 Gastro-esophageal reflux disease without esophagitis: Secondary | ICD-10-CM

## 2023-04-13 LAB — POCT CBG (FASTING - GLUCOSE)-MANUAL ENTRY: Glucose Fasting, POC: 97 mg/dL (ref 70–99)

## 2023-04-13 MED ORDER — AZELASTINE HCL 137 MCG/SPRAY NA SOLN: 1.0000 | Freq: Every day | NASAL | 2 refills | Status: DC

## 2023-04-13 MED ORDER — PANTOPRAZOLE SODIUM 20 MG PO TBEC: 20.0000 mg | DELAYED_RELEASE_TABLET | Freq: Every day | ORAL | 0 refills | Status: DC

## 2023-04-13 MED ORDER — BENAZEPRIL HCL 10 MG PO TABS: 10.0000 mg | ORAL_TABLET | Freq: Every day | ORAL | 0 refills | Status: DC

## 2023-04-13 MED ORDER — CETIRIZINE HCL 10 MG PO TABS: 10.0000 mg | ORAL_TABLET | Freq: Every day | ORAL | 2 refills | Status: DC

## 2023-04-13 MED ORDER — EMPAGLIFLOZIN 25 MG PO TABS: 25.0000 mg | ORAL_TABLET | Freq: Every morning | ORAL | 0 refills | Status: DC

## 2023-04-13 NOTE — Progress Notes (Signed)
Established Patient Office Visit  Subjective:  Patient ID: Omar [name], male    DOB: June 11, 1940  Age: 82 y.o. MRN: [id number]  Chief Complaint  Patient presents with   Follow-up    No new complaints, here for lab review and medication refills. Failed to have previsit labs done.     No other concerns at this time.   Past Medical History:  Diagnosis Date   AAA (abdominal aortic aneurysm) (HCC)    a.) Omar [name]/p EVAR 11/05/2013.   Anxiety    a.) on BZO (alprazolam) PRN   Aortic atherosclerosis (HCC)    Atrial fibrillation (HCC)    a.) CHA2DS2-VASc = 6 (age x2, CHF, HTN, PVD/aortic plaque, T2DM). b.) rate/rhythm maintained without use of pharmacological intervention; chronically anticoagulated using dose reduced apixaban.   BPH (benign prostatic hyperplasia)    CHF (congestive heart failure) (HCC)    a.)  TTE 03/16/2021: EF 63%; moderate LVH; mild BAE mild to moderate pan valvular regurgitation; G1DD.   CKD (chronic kidney disease), stage III (HCC)    Colon polyp 07/07/2015   TUBULAR ADENOMA WITH AT LEAST HIGH-GRADE / Dr Shelle Iron   COPD (chronic obstructive pulmonary disease) (HCC)    Coronary artery disease    a.) coronary CTA 01/10/2015 CA score 722.3; mod-sev LAD disease; mild LCx disease; 50-60% RCA disease.   Current use of long term anticoagulation    a.) dose reduced apixaban   Diverticulosis    Erectile dysfunction    GERD (gastroesophageal reflux disease)    Gout    Heart murmur    Hepatic cyst    Hepatic steatosis    Hepatitis    Hypercholesterolemia    Hypertension    Iron deficiency anemia    Migraines    Multiple myeloma (HCC)    Nephrolithiasis    OSA on CPAP    Presence of permanent cardiac pacemaker    Prostate cancer (HCC) 01/01/13, 01/30/14   Gleason 3+4=7, volume 46.6 cc   PVD (peripheral vascular disease) (HCC)    Renal cyst    Rheumatoid arthritis (HCC)    Omar [name]/P radiation therapy  04/03/2014 through 06/04/2014                                                       Prostate 7800 cGy in 40 sessions                           SSS (sick sinus syndrome) (HCC)    a.) Omar [name]/p Medtronic Azure PPM placement 08/26/2016   T2DM (type 2 diabetes mellitus) (HCC)    Third degree heart block (HCC) 08/26/2016   a.) Omar [name]/p PPM placement (Medtronic Azure XT DR MRI [id number] Omar [name]/N: [id number]) on 08/26/2016   Vitamin D deficiency     Past Surgical History:  Procedure Laterality Date   ABDOMINAL AORTIC ANEURYSM REPAIR  11/05/2013   COLON SURGERY  08/06/2015   Right hemicolectomy for tubulovillous adenoma with high-grade dysplasia.   COLONOSCOPY WITH PROPOFOL N/A 07/07/2015   Procedure: COLONOSCOPY WITH PROPOFOL;  Surgeon: Elnita Maxwell, MD;  Location: [facility] ENDOSCOPY;  Service: Endoscopy;  Laterality: N/A;   COLONOSCOPY WITH PROPOFOL N/A 10/28/2016   Procedure: COLONOSCOPY WITH PROPOFOL;  Surgeon: Wyline Mood, MD;  Location: [facility] ENDOSCOPY;  Service: Endoscopy;  Laterality: N/A;  CYSTOSCOPY W/ RETROGRADES Bilateral 11/16/2021   Procedure: CYSTOSCOPY WITH RETROGRADE PYELOGRAM;  Surgeon: Vanna Scotland, MD;  Location: [facility] ORS;  Service: Urology;  Laterality: Bilateral;   CYSTOSCOPY WITH BIOPSY N/A 11/16/2021   Procedure: CYSTOSCOPY WITH BLADDER BIOPSY;  Surgeon: Vanna Scotland, MD;  Location: [facility] ORS;  Service: Urology;  Laterality: N/A;   ESOPHAGOGASTRODUODENOSCOPY (EGD) WITH PROPOFOL N/A 10/28/2016   Procedure: ESOPHAGOGASTRODUODENOSCOPY (EGD) WITH PROPOFOL;  Surgeon: Wyline Mood, MD;  Location: [facility] ENDOSCOPY;  Service: Endoscopy;  Laterality: N/A;   GIVENS CAPSULE STUDY N/A 07/25/2017   Procedure: GIVENS CAPSULE STUDY;  Surgeon: Wyline Mood, MD;  Location: [facility] ENDOSCOPY;  Service: Gastroenterology;  Laterality: N/A;   GIVENS CAPSULE STUDY N/A 01/29/2019   Procedure: GIVENS CAPSULE STUDY;  Surgeon: Wyline Mood, MD;  Location: [facility] ENDOSCOPY;  Service: Gastroenterology;  Laterality: N/A;   LAPAROSCOPIC RIGHT COLECTOMY Right 08/08/2015   Procedure:  LAPAROSCOPIC RIGHT COLECTOMY;  Surgeon: Earline Mayotte, MD;  Location: [facility] ORS;  Service: General;  Laterality: Right;   NASAL SINUS SURGERY     PACEMAKER INSERTION Left 08/26/2016   Procedure: INSERTION PACEMAKER;  Surgeon: Marcina Millard, MD;  Location: [facility] ORS;  Service: Cardiovascular;  Laterality: Left;   PROSTATE BIOPSY  01/01/13, 01/30/14   Gleason 3+3=6, vol 46.6 cc   TONSILLECTOMY     uvula surgery     for sleep apnea    Social History   Socioeconomic History   Marital status: Divorced    Spouse name: Not on file   Number of children: Not on file   Years of education: Not on file   Highest education level: Not on file  Occupational History   Not on file  Tobacco Use   Smoking status: Every Day    Current packs/day: 1.00    Average packs/day: 1 pack/day for 60.0 years (60.0 ttl pk-yrs)    Types: Cigarettes   Smokeless tobacco: Never   Tobacco comments:    DOWN TO 1/2 PPD  Vaping Use   Vaping status: Never Used  Substance and Sexual Activity   Alcohol use: Yes    Alcohol/week: 1.0 standard drink of alcohol    Types: 1 Cans of beer per week    Comment: occasionally   Drug use: No   Sexual activity: Not Currently  Other Topics Concern   Not on file  Social History Narrative   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   Social Determinants of Health   Financial Resource Strain: Not on file  Food Insecurity: No Food Insecurity (09/14/2022)   Hunger Vital Sign    Worried About Running Out of Food in the Last Year: Never true    Ran Out of Food in the Last Year: Never true  Transportation Needs: No Transportation Needs (09/14/2022)   PRAPARE - Administrator, Civil Service (Medical): No    Lack of Transportation (Non-Medical): No  Physical Activity: Not on file  Stress: Not on file  Social Connections: Not on file  Intimate Partner Violence: Not on file    Family History  Problem Relation Age of Onset   Heart attack Mother    Cirrhosis Father     Alzheimer'Omar [name] disease Sister    Pancreatic cancer Brother    Diabetes Brother    Diabetes Daughter        medication induced for cancer treatments   Cancer Daughter        breast, brain    No Known Allergies  Review of Systems  Constitutional:  Negative.   HENT:         Nasal discharge  Eyes: Negative.   Respiratory:  Positive for cough.   Cardiovascular: Negative.   Gastrointestinal: Negative.   Genitourinary: Negative.   Musculoskeletal:  Positive for back pain.  Skin: Negative.   Neurological: Negative.   Endo/Heme/Allergies:  Positive for environmental allergies.  Psychiatric/Behavioral: Negative.         Objective:   BP 129/74   Pulse 75   Ht 5\' 11"  (1.803 m)   Wt 203 lb (92.1 kg)   SpO2 95%   BMI 28.31 kg/m   Vitals:   04/13/23 1014  BP: 129/74  Pulse: 75  Height: 5\' 11"  (1.803 m)  Weight: 203 lb (92.1 kg)  SpO2: 95%  BMI (Calculated): 28.33    Physical Exam Vitals reviewed.  Constitutional:      Appearance: Normal appearance.  HENT:     Head: Normocephalic.     Left Ear: There is no impacted cerumen.     Nose: Nose normal.     Mouth/Throat:     Mouth: Mucous membranes are moist.     Pharynx: No posterior oropharyngeal erythema.  Eyes:     Extraocular Movements: Extraocular movements intact.     Pupils: Pupils are equal, round, and reactive to light.  Cardiovascular:     Rate and Rhythm: Regular rhythm.     Chest Wall: PMI is not displaced.     Pulses: Normal pulses.     Heart sounds: Normal heart sounds. No murmur heard. Pulmonary:     Effort: Pulmonary effort is normal.     Breath sounds: Normal air entry. No rhonchi.  Abdominal:     General: Abdomen is flat. Bowel sounds are normal. There is no distension.     Palpations: Abdomen is soft. There is no hepatomegaly, splenomegaly or mass.     Tenderness: There is no abdominal tenderness.  Musculoskeletal:        General: Normal range of motion.     Cervical back: Normal range of  motion and neck supple.     Right lower leg: No edema.     Left lower leg: No edema.  Skin:    General: Skin is warm and dry.  Neurological:     General: No focal deficit present.     Mental Status: He is alert and oriented to person, place, and time.     Cranial Nerves: No cranial nerve deficit.     Motor: No weakness.  Psychiatric:        Mood and Affect: Mood normal.        Behavior: Behavior normal.      Results for orders placed or performed in visit on 04/13/23  POCT CBG (Fasting - Glucose)  Result Value Ref Range   Glucose Fasting, POC 97 70 - 99 mg/dL    Recent Results (from the past 2160 hour(Jaclin Finks))  Comprehensive metabolic panel     Status: Abnormal   Collection Time: 01/14/23 10:39 AM  Result Value Ref Range   Glucose 91 70 - 99 mg/dL   BUN 16 8 - 27 mg/dL   Creatinine, Ser 9.62 (H) 0.76 - 1.27 mg/dL   eGFR 44 (L) >95 MW/UXL/2.44   BUN/Creatinine Ratio 10 10 - 24   Sodium 143 134 - 144 mmol/L   Potassium 4.6 3.5 - 5.2 mmol/L   Chloride 106 96 - 106 mmol/L   CO2 24 20 - 29 mmol/L   Calcium 9.6 8.6 -  10.2 mg/dL   Total Protein 7.2 6.0 - 8.5 g/dL   Albumin 3.8 3.7 - 4.7 g/dL   Globulin, Total 3.4 1.5 - 4.5 g/dL   Bilirubin Total 0.3 0.0 - 1.2 mg/dL   Alkaline Phosphatase 87 44 - 121 IU/L   AST 19 0 - 40 IU/L   ALT 16 0 - 44 IU/L  CK     Status: None   Collection Time: 01/14/23 10:41 AM  Result Value Ref Range   Total CK 85 30 - 208 U/L  Hemoglobin A1c     Status: Abnormal   Collection Time: 01/14/23 10:43 AM  Result Value Ref Range   Hgb A1c MFr Bld 6.4 (H) 4.8 - 5.6 %    Comment:          Prediabetes: 5.7 - 6.4          Diabetes: >6.4          Glycemic control for adults with diabetes: <7.0    Est. average glucose Bld gHb Est-mCnc 137 mg/dL  POCT CBG (Fasting - Glucose)     Status: None   Collection Time: 02/02/23 11:12 AM  Result Value Ref Range   Glucose Fasting, POC 95 70 - 99 mg/dL  POCT CBG (Fasting - Glucose)     Status: None   Collection  Time: 04/13/23 10:20 AM  Result Value Ref Range   Glucose Fasting, POC 97 70 - 99 mg/dL      Assessment & Plan:  As per problem list.  Problem List Items Addressed This Visit       Cardiovascular and Mediastinum   BP (high blood pressure)   Relevant Medications   benazepril (LOTENSIN) 10 MG tablet   PAD (peripheral artery disease) (HCC)   Relevant Medications   benazepril (LOTENSIN) 10 MG tablet     Digestive   GERD (gastroesophageal reflux disease)   Relevant Medications   pantoprazole (PROTONIX) 20 MG tablet     Endocrine   Diabetes mellitus (HCC) - Primary   Relevant Medications   benazepril (LOTENSIN) 10 MG tablet   empagliflozin (JARDIANCE) 25 MG TABS tablet   Other Relevant Orders   POCT CBG (Fasting - Glucose) (Completed)   POC CREATINE & ALBUMIN,URINE     Other   HLD (hyperlipidemia)   Relevant Medications   benazepril (LOTENSIN) 10 MG tablet   Other Visit Diagnoses     Seasonal allergic rhinitis due to pollen       Relevant Medications   Azelastine HCl 137 MCG/SPRAY SOLN   cetirizine (ZYRTEC ALLERGY) 10 MG tablet       Return in about 1 week (around 04/20/2023) for lab results.   Total time spent: 20 minutes  Luna Fuse, MD  04/13/2023   This document may have been prepared by [facility] Voice Recognition software and as such may include unintentional dictation errors.

## 2023-04-14 LAB — COMPREHENSIVE METABOLIC PANEL
ALT: 11 [IU]/L (ref 0–44)
AST: 14 [IU]/L (ref 0–40)
Albumin: 4 g/dL (ref 3.7–4.7)
Alkaline Phosphatase: 81 [IU]/L (ref 44–121)
BUN/Creatinine Ratio: 12 (ref 10–24)
BUN: 21 mg/dL (ref 8–27)
Bilirubin Total: 0.5 mg/dL (ref 0.0–1.2)
CO2: 22 mmol/L (ref 20–29)
Calcium: 9.3 mg/dL (ref 8.6–10.2)
Chloride: 106 mmol/L (ref 96–106)
Creatinine, Ser: 1.72 mg/dL — ABNORMAL HIGH (ref 0.76–1.27)
Globulin, Total: 3.3 g/dL (ref 1.5–4.5)
Glucose: 90 mg/dL (ref 70–99)
Potassium: 4.3 mmol/L (ref 3.5–5.2)
Sodium: 142 mmol/L (ref 134–144)
Total Protein: 7.3 g/dL (ref 6.0–8.5)
eGFR: 39 mL/min/{1.73_m2} — ABNORMAL LOW (ref >59)

## 2023-04-14 LAB — LIPID PANEL
Chol/HDL Ratio: 3.2 ratio (ref 0.0–5.0)
Cholesterol, Total: 112 mg/dL (ref 100–199)
HDL: 35 mg/dL — ABNORMAL LOW (ref >39)
LDL Chol Calc (NIH): 63 mg/dL (ref 0–99)
Triglycerides: 63 mg/dL (ref 0–149)
VLDL Cholesterol Cal: 14 mg/dL (ref 5–40)

## 2023-04-14 LAB — HEMOGLOBIN A1C
Est. average glucose Bld gHb Est-mCnc: 128 mg/dL
Hgb A1c MFr Bld: 6.1 % — ABNORMAL HIGH (ref 4.8–5.6)

## 2023-04-14 LAB — CK: Total CK: 78 U/L (ref 30–208)

## 2023-04-18 ENCOUNTER — Inpatient Hospital Stay: Payer: Medicare PPO | Attending: Oncology

## 2023-04-18 DIAGNOSIS — G4733 Obstructive sleep apnea (adult) (pediatric): Secondary | ICD-10-CM | POA: Insufficient documentation

## 2023-04-18 DIAGNOSIS — Z79631 Long term (current) use of antimetabolite agent: Secondary | ICD-10-CM | POA: Diagnosis not present

## 2023-04-18 DIAGNOSIS — N183 Chronic kidney disease, stage 3 unspecified: Secondary | ICD-10-CM | POA: Insufficient documentation

## 2023-04-18 DIAGNOSIS — C61 Malignant neoplasm of prostate: Secondary | ICD-10-CM | POA: Insufficient documentation

## 2023-04-18 DIAGNOSIS — C9000 Multiple myeloma not having achieved remission: Secondary | ICD-10-CM | POA: Insufficient documentation

## 2023-04-18 DIAGNOSIS — F17210 Nicotine dependence, cigarettes, uncomplicated: Secondary | ICD-10-CM | POA: Insufficient documentation

## 2023-04-18 DIAGNOSIS — D472 Monoclonal gammopathy: Secondary | ICD-10-CM

## 2023-04-18 DIAGNOSIS — Z79899 Other long term (current) drug therapy: Secondary | ICD-10-CM | POA: Insufficient documentation

## 2023-04-18 DIAGNOSIS — M069 Rheumatoid arthritis, unspecified: Secondary | ICD-10-CM | POA: Insufficient documentation

## 2023-04-18 DIAGNOSIS — R5383 Other fatigue: Secondary | ICD-10-CM | POA: Insufficient documentation

## 2023-04-18 DIAGNOSIS — R911 Solitary pulmonary nodule: Secondary | ICD-10-CM | POA: Insufficient documentation

## 2023-04-18 LAB — CBC WITH DIFFERENTIAL (CANCER CENTER ONLY)
Abs Immature Granulocytes: 0.05 10*3/uL (ref 0.00–0.07)
Basophils Absolute: 0 10*3/uL (ref 0.0–0.1)
Basophils Relative: 1 %
Eosinophils Absolute: 0.4 10*3/uL (ref 0.0–0.5)
Eosinophils Relative: 5 %
HCT: 47.4 % (ref 39.0–52.0)
Hemoglobin: 15.4 g/dL (ref 13.0–17.0)
Immature Granulocytes: 1 %
Lymphocytes Relative: 13 %
Lymphs Abs: 1.1 10*3/uL (ref 0.7–4.0)
MCH: 29.9 pg (ref 26.0–34.0)
MCHC: 32.5 g/dL (ref 30.0–36.0)
MCV: 92 fL (ref 80.0–100.0)
Monocytes Absolute: 0.7 10*3/uL (ref 0.1–1.0)
Monocytes Relative: 9 %
Neutro Abs: 5.8 10*3/uL (ref 1.7–7.7)
Neutrophils Relative %: 71 %
Platelet Count: 221 10*3/uL (ref 150–400)
RBC: 5.15 MIL/uL (ref 4.22–5.81)
RDW: 21.9 % — ABNORMAL HIGH (ref 11.5–15.5)
WBC Count: 8.1 10*3/uL (ref 4.0–10.5)
nRBC: 0 % (ref 0.0–0.2)

## 2023-04-18 LAB — CMP (CANCER CENTER ONLY)
ALT: 11 U/L (ref 0–44)
AST: 14 U/L — ABNORMAL LOW (ref 15–41)
Albumin: 3.7 g/dL (ref 3.5–5.0)
Alkaline Phosphatase: 66 U/L (ref 38–126)
Anion gap: 9 (ref 5–15)
BUN: 9 mg/dL (ref 8–23)
CO2: 22 mmol/L (ref 22–32)
Calcium: 8.9 mg/dL (ref 8.9–10.3)
Chloride: 109 mmol/L (ref 98–111)
Creatinine: 1.48 mg/dL — ABNORMAL HIGH (ref 0.61–1.24)
GFR, Estimated: 47 mL/min — ABNORMAL LOW (ref >60)
Glucose, Bld: 95 mg/dL (ref 70–99)
Potassium: 4.3 mmol/L (ref 3.5–5.1)
Sodium: 140 mmol/L (ref 135–145)
Total Bilirubin: 1 mg/dL (ref <1.2)
Total Protein: 7.6 g/dL (ref 6.5–8.1)

## 2023-04-19 LAB — KAPPA/LAMBDA LIGHT CHAINS
Kappa free light chain: 203 mg/L — ABNORMAL HIGH (ref 3.3–19.4)
Kappa, lambda light chain ratio: 6.66 — ABNORMAL HIGH (ref 0.26–1.65)
Lambda free light chains: 30.5 mg/L — ABNORMAL HIGH (ref 5.7–26.3)

## 2023-04-21 LAB — MULTIPLE MYELOMA PANEL, SERUM
Albumin SerPl Elph-Mcnc: 3.8 g/dL (ref 2.9–4.4)
Albumin/Glob SerPl: 1.2 (ref 0.7–1.7)
Alpha 1: 0.2 g/dL (ref 0.0–0.4)
Alpha2 Glob SerPl Elph-Mcnc: 0.6 g/dL (ref 0.4–1.0)
B-Globulin SerPl Elph-Mcnc: 0.9 g/dL (ref 0.7–1.3)
Gamma Glob SerPl Elph-Mcnc: 1.8 g/dL (ref 0.4–1.8)
Globulin, Total: 3.4 g/dL (ref 2.2–3.9)
IgA: 240 mg/dL (ref 61–437)
IgG (Immunoglobin G), Serum: 1780 mg/dL — ABNORMAL HIGH (ref 603–1613)
IgM (Immunoglobulin M), Srm: 115 mg/dL (ref 15–143)
Total Protein ELP: 7.2 g/dL (ref 6.0–8.5)

## 2023-04-26 ENCOUNTER — Ambulatory Visit: Payer: Medicare PPO | Admitting: Internal Medicine

## 2023-04-26 ENCOUNTER — Encounter: Payer: Self-pay | Admitting: Internal Medicine

## 2023-04-26 VITALS — BP 130/72 | HR 84 | Ht 71.0 in | Wt 203.2 lb

## 2023-04-26 DIAGNOSIS — E782 Mixed hyperlipidemia: Secondary | ICD-10-CM | POA: Diagnosis not present

## 2023-04-26 DIAGNOSIS — Z0130 Encounter for examination of blood pressure without abnormal findings: Secondary | ICD-10-CM

## 2023-04-26 DIAGNOSIS — E119 Type 2 diabetes mellitus without complications: Secondary | ICD-10-CM

## 2023-04-26 LAB — POCT CBG (FASTING - GLUCOSE)-MANUAL ENTRY: Glucose Fasting, POC: 95 mg/dL (ref 70–99)

## 2023-04-26 NOTE — Progress Notes (Signed)
Established Patient Office Visit  Subjective:  Patient ID: Omar [name], male    DOB: [birth year]  Age: 82 y.o. MRN: [id number]  Chief Complaint  Patient presents with   Follow-up    1 week follow up, discuss lab results.    No new complaints, here for lab review and medication refills. Labs reviewed and notable for well controlled/ iabetes, A1c at target, lipids at target with unremarkable cmp. Denies any symptomatic hypoglycemic episodes.   No other concerns at this time.   Past Medical History:  Diagnosis Date   AAA (abdominal aortic aneurysm) (HCC)    a.) Saad Buhl/p EVAR 11/05/2013.   Anxiety    a.) on BZO (alprazolam) PRN   Aortic atherosclerosis (HCC)    Atrial fibrillation (HCC)    a.) CHA2DS2-VASc = 6 (age x2, CHF, HTN, PVD/aortic plaque, T2DM). b.) rate/rhythm maintained without use of pharmacological intervention; chronically anticoagulated using dose reduced apixaban.   BPH (benign prostatic hyperplasia)    CHF (congestive heart failure) (HCC)    a.)  TTE 03/16/2021: EF 63%; moderate LVH; mild BAE mild to moderate pan valvular regurgitation; G1DD.   CKD (chronic kidney disease), stage III (HCC)    Colon polyp 07/07/2015   TUBULAR ADENOMA WITH AT LEAST HIGH-GRADE / Dr Shelle Iron   COPD (chronic obstructive pulmonary disease) (HCC)    Coronary artery disease    a.) coronary CTA 01/10/2015 CA score 722.3; mod-sev LAD disease; mild LCx disease; 50-60% RCA disease.   Current use of long term anticoagulation    a.) dose reduced apixaban   Diverticulosis    Erectile dysfunction    GERD (gastroesophageal reflux disease)    Gout    Heart murmur    Hepatic cyst    Hepatic steatosis    Hepatitis    Hypercholesterolemia    Hypertension    Iron deficiency anemia    Migraines    Multiple myeloma (HCC)    Nephrolithiasis    OSA on CPAP    Presence of permanent cardiac pacemaker    Prostate cancer (HCC) 01/01/13, 01/30/14   Gleason 3+4=7, volume 46.6 cc   PVD (peripheral  vascular disease) (HCC)    Renal cyst    Rheumatoid arthritis (HCC)    Ouida Abeyta/P radiation therapy  04/03/2014 through 06/04/2014                                                      Prostate 7800 cGy in 40 sessions                           SSS (sick sinus syndrome) (HCC)    a.) Dishawn Bhargava/p Medtronic Azure PPM placement 08/26/2016   T2DM (type 2 diabetes mellitus) (HCC)    Third degree heart block (HCC) 08/26/2016   a.) Anniece Bleiler/p PPM placement (Medtronic Azure XT DR MRI [id number] Lynanne Delgreco/N: [id number]) on 08/26/2016   Vitamin D deficiency     Past Surgical History:  Procedure Laterality Date   ABDOMINAL AORTIC ANEURYSM REPAIR  11/05/2013   COLON SURGERY  08/06/2015   Right hemicolectomy for tubulovillous adenoma with high-grade dysplasia.   COLONOSCOPY WITH PROPOFOL N/A 07/07/2015   Procedure: COLONOSCOPY WITH PROPOFOL;  Surgeon: Elnita Maxwell, MD;  Location: [facility] ENDOSCOPY;  Service: Endoscopy;  Laterality: N/A;   COLONOSCOPY WITH PROPOFOL N/A  10/28/2016   Procedure: COLONOSCOPY WITH PROPOFOL;  Surgeon: Wyline Mood, MD;  Location: [facility] ENDOSCOPY;  Service: Endoscopy;  Laterality: N/A;   CYSTOSCOPY W/ RETROGRADES Bilateral 11/16/2021   Procedure: CYSTOSCOPY WITH RETROGRADE PYELOGRAM;  Surgeon: Vanna Scotland, MD;  Location: [facility] ORS;  Service: Urology;  Laterality: Bilateral;   CYSTOSCOPY WITH BIOPSY N/A 11/16/2021   Procedure: CYSTOSCOPY WITH BLADDER BIOPSY;  Surgeon: Vanna Scotland, MD;  Location: [facility] ORS;  Service: Urology;  Laterality: N/A;   ESOPHAGOGASTRODUODENOSCOPY (EGD) WITH PROPOFOL N/A 10/28/2016   Procedure: ESOPHAGOGASTRODUODENOSCOPY (EGD) WITH PROPOFOL;  Surgeon: Wyline Mood, MD;  Location: [facility] ENDOSCOPY;  Service: Endoscopy;  Laterality: N/A;   GIVENS CAPSULE STUDY N/A 07/25/2017   Procedure: GIVENS CAPSULE STUDY;  Surgeon: Wyline Mood, MD;  Location: [facility] ENDOSCOPY;  Service: Gastroenterology;  Laterality: N/A;   GIVENS CAPSULE STUDY N/A 01/29/2019   Procedure: GIVENS CAPSULE STUDY;   Surgeon: Wyline Mood, MD;  Location: [facility] ENDOSCOPY;  Service: Gastroenterology;  Laterality: N/A;   LAPAROSCOPIC RIGHT COLECTOMY Right 08/08/2015   Procedure: LAPAROSCOPIC RIGHT COLECTOMY;  Surgeon: Earline Mayotte, MD;  Location: [facility] ORS;  Service: General;  Laterality: Right;   NASAL SINUS SURGERY     PACEMAKER INSERTION Left 08/26/2016   Procedure: INSERTION PACEMAKER;  Surgeon: Marcina Millard, MD;  Location: [facility] ORS;  Service: Cardiovascular;  Laterality: Left;   PROSTATE BIOPSY  01/01/13, 01/30/14   Gleason 3+3=6, vol 46.6 cc   TONSILLECTOMY     uvula surgery     for sleep apnea    Social History   Socioeconomic History   Marital status: Divorced    Spouse name: Not on file   Number of children: Not on file   Years of education: Not on file   Highest education level: Not on file  Occupational History   Not on file  Tobacco Use   Smoking status: Every Day    Current packs/day: 1.00    Average packs/day: 1 pack/day for 60.0 years (60.0 ttl pk-yrs)    Types: Cigarettes   Smokeless tobacco: Never   Tobacco comments:    DOWN TO 1/2 PPD  Vaping Use   Vaping status: Never Used  Substance and Sexual Activity   Alcohol use: Yes    Alcohol/week: 1.0 standard drink of alcohol    Types: 1 Cans of beer per week    Comment: occasionally   Drug use: No   Sexual activity: Not Currently  Other Topics Concern   Not on file  Social History Narrative   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   Social Determinants of Health   Financial Resource Strain: Not on file  Food Insecurity: No Food Insecurity (09/14/2022)   Hunger Vital Sign    Worried About Running Out of Food in the Last Year: Never true    Ran Out of Food in the Last Year: Never true  Transportation Needs: No Transportation Needs (09/14/2022)   PRAPARE - Administrator, Civil Service (Medical): No    Lack of Transportation (Non-Medical): No  Physical Activity: Not on file  Stress: Not on file  Social  Connections: Not on file  Intimate Partner Violence: Not on file    Family History  Problem Relation Age of Onset   Heart attack Mother    Cirrhosis Father    Alzheimer'Micki Cassel disease Sister    Pancreatic cancer Brother    Diabetes Brother    Diabetes Daughter        medication induced for cancer treatments  Cancer Daughter        breast, brain    No Known Allergies  Outpatient Medications Prior to Visit  Medication Sig   albuterol (PROVENTIL HFA;VENTOLIN HFA) 108 (90 Base) MCG/ACT inhaler Inhale 1 puff into the lungs every 6 (six) hours as needed.   allopurinol (ZYLOPRIM) 100 MG tablet Take 1 tablet (100 mg total) by mouth daily.   ALPRAZolam (XANAX) 0.25 MG tablet Take by mouth 2 (two) times daily as needed.   atorvastatin (LIPITOR) 80 MG tablet TAKE 1 TABLET BY MOUTH AT BEDTIME   Azelastine HCl 137 MCG/SPRAY SOLN Place 1 spray into the nose daily.   benazepril (LOTENSIN) 10 MG tablet Take 1 tablet (10 mg total) by mouth daily.   BREO ELLIPTA 100-25 MCG/ACT AEPB INHALE 1 PUFF BY MOUTH ONCE DAILY AS DIRECTED   cetirizine (ZYRTEC ALLERGY) 10 MG tablet Take 1 tablet (10 mg total) by mouth daily.   Cholecalciferol (VITAMIN D3) 1.25 MG (50000 UT) CAPS Take 1 capsule (50,000 Units total) by mouth once a week.   ELIQUIS 2.5 MG TABS tablet Take 1 tablet by mouth twice daily   empagliflozin (JARDIANCE) 25 MG TABS tablet Take 1 tablet (25 mg total) by mouth every morning.   ezetimibe (ZETIA) 10 MG tablet Take 1 tablet by mouth once daily   fluticasone (FLONASE) 50 MCG/ACT nasal spray Place 1 spray into both nostrils daily as needed for rhinitis.   folic acid (FOLVITE) 1 MG tablet Take 1 mg by mouth daily.   gabapentin (NEURONTIN) 400 MG capsule TAKE 1 CAPSULE BY MOUTH THREE TIMES DAILY   HUMIRA PEN 40 MG/0.4ML PNKT Inject 40 mg into the skin every 14 (fourteen) days.   isosorbide mononitrate (IMDUR) 60 MG 24 hr tablet Take 1 tablet by mouth once daily   methotrexate (RHEUMATREX) 2.5 MG  tablet Take 10 mg by mouth once a week.   metoprolol succinate (TOPROL-XL) 25 MG 24 hr tablet Take 1 tablet by mouth once daily   mupirocin ointment (BACTROBAN) 2 % APPLY TO RIGHT GREAT TOE ONCE DAILY UNTIL HEALED   pantoprazole (PROTONIX) 20 MG tablet Take 1 tablet (20 mg total) by mouth daily.   tamsulosin (FLOMAX) 0.4 MG CAPS capsule Take 1 capsule (0.4 mg total) by mouth daily.   traMADol-acetaminophen (ULTRACET) 37.5-325 MG tablet TAKE 1 TABLET BY MOUTH EVERY 6 HOURS AS NEEDED FOR PAIN   triamcinolone cream (KENALOG) 0.1 % Apply 1 Application topically 2 (two) times daily. to affected area   No facility-administered medications prior to visit.    Review of Systems  Constitutional: Negative.   HENT:  Positive for congestion.        Nasal discharge  Eyes: Negative.   Respiratory:  Positive for cough.   Cardiovascular: Negative.   Gastrointestinal: Negative.   Genitourinary: Negative.   Musculoskeletal:  Positive for back pain.  Skin: Negative.   Neurological: Negative.   Endo/Heme/Allergies:  Positive for environmental allergies.  Psychiatric/Behavioral: Negative.         Objective:   BP 130/72   Pulse 84   Ht 5\' 11"  (1.803 m)   Wt 203 lb 3.2 oz (92.2 kg)   SpO2 97%   BMI 28.34 kg/m   Vitals:   04/26/23 1527  BP: 130/72  Pulse: 84  Height: 5\' 11"  (1.803 m)  Weight: 203 lb 3.2 oz (92.2 kg)  SpO2: 97%  BMI (Calculated): 28.35    Physical Exam Vitals reviewed.  Constitutional:      Appearance: Normal appearance.  HENT:     Head: Normocephalic.     Left Ear: There is no impacted cerumen.     Nose: Nose normal.     Mouth/Throat:     Mouth: Mucous membranes are moist.     Pharynx: No posterior oropharyngeal erythema.  Eyes:     Extraocular Movements: Extraocular movements intact.     Pupils: Pupils are equal, round, and reactive to light.  Cardiovascular:     Rate and Rhythm: Regular rhythm.     Chest Wall: PMI is not displaced.     Pulses: Normal  pulses.     Heart sounds: Normal heart sounds. No murmur heard. Pulmonary:     Effort: Pulmonary effort is normal.     Breath sounds: Normal air entry. No rhonchi.  Abdominal:     General: Abdomen is flat. Bowel sounds are normal. There is no distension.     Palpations: Abdomen is soft. There is no hepatomegaly, splenomegaly or mass.     Tenderness: There is no abdominal tenderness.  Musculoskeletal:        General: Normal range of motion.     Cervical back: Normal range of motion and neck supple.     Right lower leg: No edema.     Left lower leg: No edema.  Skin:    General: Skin is warm and dry.  Neurological:     General: No focal deficit present.     Mental Status: He is alert and oriented to person, place, and time.     Cranial Nerves: No cranial nerve deficit.     Motor: No weakness.  Psychiatric:        Mood and Affect: Mood normal.        Behavior: Behavior normal.      Results for orders placed or performed in visit on 04/26/23  POCT CBG (Fasting - Glucose)  Result Value Ref Range   Glucose Fasting, POC 95 70 - 99 mg/dL    Recent Results (from the past 2160 hour(Daymon Hora))  POCT CBG (Fasting - Glucose)     Status: None   Collection Time: 02/02/23 11:12 AM  Result Value Ref Range   Glucose Fasting, POC 95 70 - 99 mg/dL  POCT CBG (Fasting - Glucose)     Status: None   Collection Time: 04/13/23 10:20 AM  Result Value Ref Range   Glucose Fasting, POC 97 70 - 99 mg/dL  Hemoglobin U1L     Status: Abnormal   Collection Time: 04/13/23 10:48 AM  Result Value Ref Range   Hgb A1c MFr Bld 6.1 (H) 4.8 - 5.6 %    Comment:          Prediabetes: 5.7 - 6.4          Diabetes: >6.4          Glycemic control for adults with diabetes: <7.0    Est. average glucose Bld gHb Est-mCnc 128 mg/dL  CK     Status: None   Collection Time: 04/13/23 10:48 AM  Result Value Ref Range   Total CK 78 30 - 208 U/L  Lipid panel     Status: Abnormal   Collection Time: 04/13/23 10:48 AM  Result  Value Ref Range   Cholesterol, Total 112 100 - 199 mg/dL   Triglycerides 63 0 - 149 mg/dL   HDL 35 (L) >24 mg/dL   VLDL Cholesterol Cal 14 5 - 40 mg/dL   LDL Chol Calc (NIH) 63 0 - 99 mg/dL  Chol/HDL Ratio 3.2 0.0 - 5.0 ratio    Comment:                                   T. Chol/HDL Ratio                                             Men  Women                               1/2 Avg.Risk  3.4    3.3                                   Avg.Risk  5.0    4.4                                2X Avg.Risk  9.6    7.1                                3X Avg.Risk 23.4   11.0   Comprehensive metabolic panel     Status: Abnormal   Collection Time: 04/13/23 10:48 AM  Result Value Ref Range   Glucose 90 70 - 99 mg/dL   BUN 21 8 - 27 mg/dL   Creatinine, Ser 8.18 (H) 0.76 - 1.27 mg/dL   eGFR 39 (L) >29 HB/ZJI/9.67   BUN/Creatinine Ratio 12 10 - 24   Sodium 142 134 - 144 mmol/L   Potassium 4.3 3.5 - 5.2 mmol/L   Chloride 106 96 - 106 mmol/L   CO2 22 20 - 29 mmol/L   Calcium 9.3 8.6 - 10.2 mg/dL   Total Protein 7.3 6.0 - 8.5 g/dL   Albumin 4.0 3.7 - 4.7 g/dL   Globulin, Total 3.3 1.5 - 4.5 g/dL   Bilirubin Total 0.5 0.0 - 1.2 mg/dL   Alkaline Phosphatase 81 44 - 121 IU/L   AST 14 0 - 40 IU/L   ALT 11 0 - 44 IU/L  Kappa/lambda light chains     Status: Abnormal   Collection Time: 04/18/23  2:04 PM  Result Value Ref Range   Kappa free light chain 203.0 (H) 3.3 - 19.4 mg/L   Lambda free light chains 30.5 (H) 5.7 - 26.3 mg/L   Kappa, lambda light chain ratio 6.66 (H) 0.26 - 1.65    Comment: (NOTE) Performed At: [facility] [address] [place], [place] [id number] Jolene Schimke MD [id number]   Multiple Myeloma Panel (SPEP&IFE w/QIG)     Status: Abnormal   Collection Time: 04/18/23  2:04 PM  Result Value Ref Range   IgG (Immunoglobin G), Serum 1,780 (H) 603 - 1,613 mg/dL   IgA 235 61 - 361 mg/dL   IgM (Immunoglobulin M), Srm 115 15 - 143 mg/dL   Total Protein ELP 7.2 6.0 - 8.5 g/dL    Albumin SerPl Elph-Mcnc 3.8 2.9 - 4.4 g/dL   Alpha 1 0.2 0.0 - 0.4 g/dL   Alpha2 Glob SerPl Elph-Mcnc 0.6 0.4 - 1.0 g/dL   B-Globulin SerPl Elph-Mcnc 0.9 0.7 - 1.3  g/dL   Gamma Glob SerPl Elph-Mcnc 1.8 0.4 - 1.8 g/dL   M Protein SerPl Elph-Mcnc Not Observed Not Observed g/dL   Globulin, Total 3.4 2.2 - 3.9 g/dL   Albumin/Glob SerPl 1.2 0.7 - 1.7   IFE 1 Comment (A)     Comment: Polyclonal increase detected in one or more immunoglobulins.   Please Note Comment     Comment: (NOTE) Protein electrophoresis scan will follow via computer, mail, or courier delivery. Performed At: [facility] [address] [place], [place] [id number] Jolene Schimke MD WU:[id number]   CMP (Cancer Center only)     Status: Abnormal   Collection Time: 04/18/23  2:04 PM  Result Value Ref Range   Sodium 140 135 - 145 mmol/L   Potassium 4.3 3.5 - 5.1 mmol/L   Chloride 109 98 - 111 mmol/L   CO2 22 22 - 32 mmol/L   Glucose, Bld 95 70 - 99 mg/dL    Comment: Glucose reference range applies only to samples taken after fasting for at least 8 hours.   BUN 9 8 - 23 mg/dL   Creatinine 9.56 (H) 2.13 - 1.24 mg/dL   Calcium 8.9 8.9 - 08.6 mg/dL   Total Protein 7.6 6.5 - 8.1 g/dL   Albumin 3.7 3.5 - 5.0 g/dL   AST 14 (L) 15 - 41 U/L   ALT 11 0 - 44 U/L   Alkaline Phosphatase 66 38 - 126 U/L   Total Bilirubin 1.0 <1.2 mg/dL   GFR, Estimated 47 (L) >60 mL/min    Comment: (NOTE) Calculated using the CKD-EPI Creatinine Equation (2021)    Anion gap 9 5 - 15    Comment: Performed at [facility], [address] [place], [place] [address]  CBC with Differential (Cancer Center Only)     Status: Abnormal   Collection Time: 04/18/23  2:04 PM  Result Value Ref Range   WBC Count 8.1 4.0 - 10.5 K/uL   RBC 5.15 4.22 - 5.81 MIL/uL   Hemoglobin 15.4 13.0 - 17.0 g/dL   HCT 96.2 95.2 - 84.1 %   MCV 92.0 80.0 - 100.0 fL   MCH 29.9 26.0 - 34.0 pg   MCHC 32.5 30.0 - 36.0 g/dL   RDW 32.4 (H) 40.1 - 02.7 %    Platelet Count 221 150 - 400 K/uL   nRBC 0.0 0.0 - 0.2 %   Neutrophils Relative % 71 %   Neutro Abs 5.8 1.7 - 7.7 K/uL   Lymphocytes Relative 13 %   Lymphs Abs 1.1 0.7 - 4.0 K/uL   Monocytes Relative 9 %   Monocytes Absolute 0.7 0.1 - 1.0 K/uL   Eosinophils Relative 5 %   Eosinophils Absolute 0.4 0.0 - 0.5 K/uL   Basophils Relative 1 %   Basophils Absolute 0.0 0.0 - 0.1 K/uL   Immature Granulocytes 1 %   Abs Immature Granulocytes 0.05 0.00 - 0.07 K/uL    Comment: Performed at [facility], [address] [place], [place] [address]  POCT CBG (Fasting - Glucose)     Status: None   Collection Time: 04/26/23  3:33 PM  Result Value Ref Range   Glucose Fasting, POC 95 70 - 99 mg/dL      Assessment & Plan:  As per problem list  Problem List Items Addressed This Visit       Endocrine   Diabetes mellitus (HCC) - Primary   Relevant Orders   POCT CBG (Fasting - Glucose) (Completed)  Hemoglobin A1c   Lipid panel     Other   HLD (hyperlipidemia)    Return in about 3 months (around 07/27/2023) for fu with labs prior.   Total time spent: 20 minutes  Luna Fuse, MD  04/26/2023   This document may have been prepared by [facility] Voice Recognition software and as such may include unintentional dictation errors.

## 2023-04-27 ENCOUNTER — Other Ambulatory Visit: Payer: Self-pay | Admitting: Internal Medicine

## 2023-04-28 ENCOUNTER — Inpatient Hospital Stay: Payer: Medicare PPO | Admitting: Oncology

## 2023-04-28 ENCOUNTER — Encounter: Payer: Self-pay | Admitting: Oncology

## 2023-04-28 VITALS — BP 118/74 | HR 51 | Temp 96.6°F | Resp 18 | Wt 202.1 lb

## 2023-04-28 DIAGNOSIS — N183 Chronic kidney disease, stage 3 unspecified: Secondary | ICD-10-CM | POA: Diagnosis not present

## 2023-04-28 DIAGNOSIS — R911 Solitary pulmonary nodule: Secondary | ICD-10-CM | POA: Diagnosis not present

## 2023-04-28 DIAGNOSIS — F17200 Nicotine dependence, unspecified, uncomplicated: Secondary | ICD-10-CM | POA: Diagnosis not present

## 2023-04-28 DIAGNOSIS — Z79631 Long term (current) use of antimetabolite agent: Secondary | ICD-10-CM | POA: Diagnosis not present

## 2023-04-28 DIAGNOSIS — G4733 Obstructive sleep apnea (adult) (pediatric): Secondary | ICD-10-CM | POA: Diagnosis not present

## 2023-04-28 DIAGNOSIS — D472 Monoclonal gammopathy: Secondary | ICD-10-CM

## 2023-04-28 DIAGNOSIS — C61 Malignant neoplasm of prostate: Secondary | ICD-10-CM | POA: Diagnosis not present

## 2023-04-28 DIAGNOSIS — M069 Rheumatoid arthritis, unspecified: Secondary | ICD-10-CM | POA: Diagnosis not present

## 2023-04-28 DIAGNOSIS — C9000 Multiple myeloma not having achieved remission: Secondary | ICD-10-CM | POA: Diagnosis not present

## 2023-04-28 DIAGNOSIS — R5383 Other fatigue: Secondary | ICD-10-CM | POA: Diagnosis not present

## 2023-04-28 DIAGNOSIS — F17210 Nicotine dependence, cigarettes, uncomplicated: Secondary | ICD-10-CM | POA: Diagnosis not present

## 2023-04-28 NOTE — Assessment & Plan Note (Signed)
Encourage his smoke cessation effort.  Erythrocytosis has resolved.

## 2023-04-28 NOTE — Progress Notes (Signed)
Pt here for follow up. No new concerns voiced.   

## 2023-04-28 NOTE — Assessment & Plan Note (Addendum)
Smoldering kappa light chain multiple myeloma/active myeloma if previous hypermetabolic lesion on PET scan was related to myeloma.Patient was treated with 4 cycles of RVD.  He is not currently on any maintenance therapy due to multiple comorbidities, borderline smoldering multiple myeloma status.    Lab Results  Component Value Date   MPROTEIN Not Observed 04/18/2023   KPAFRELGTCHN 203.0 (H) 04/18/2023   LAMBDASER 30.5 (H) 04/18/2023   KAPLAMBRATIO 6.66 (H) 04/18/2023    His kidney function is stable, no anemia.  No hypercalcemia. Stable/slow progressing light chain ratio. Obtain 24 hour UPEP, obtain skeletal survey Consider repeat bone marrow biopsy if he starts to show signs of organ damage

## 2023-04-28 NOTE — Assessment & Plan Note (Signed)
Repeat CT chest in Sept 2024 showed stable right upper lobe and right lower lobe lung nodules

## 2023-04-28 NOTE — Progress Notes (Signed)
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  He is not currently on any maintenance therapy due to multiple comorbidities, borderline smoldering multiple myeloma status.    Lab Results  Component Value Date   MPROTEIN Not Observed 04/18/2023   KPAFRELGTCHN 203.0 (H) 04/18/2023   LAMBDASER 30.5 (H) 04/18/2023   KAPLAMBRATIO 6.66 (H) 04/18/2023    His kidney function is stable, no anemia.  No hypercalcemia. Stable/slow progressing light chain ratio. Obtain 24 hour UPEP, obtain skeletal survey Consider repeat bone marrow biopsy if he starts to show signs of organ damage  Tobacco use disorder Encourage his smoke cessation effort.  Erythrocytosis has resolved.   Lung nodule Repeat CT chest in Sept 2024 showed stable right upper lobe and right lower lobe lung nodules  Orders Placed This Encounter  Procedures   DG Bone Survey Met    Standing Status:   Future    Standing Expiration Date:   04/27/2024    Order Specific Question:   Reason for Exam (SYMPTOM  OR DIAGNOSIS REQUIRED)    Answer:   smoldering multple myeloma    Order Specific Question:   Preferred imaging location?    Answer:   [facility]   IFE+PROTEIN ELECTRO, 24-HR UR    Standing Status:   Future    Standing Expiration Date:   04/27/2024   CMP (Cancer Center only)    Standing Status:   Future    Standing Expiration Date:   04/27/2024   CBC with Differential (Cancer Center Only)    Standing Status:   Future    Standing Expiration Date:   04/27/2024   Multiple Myeloma Panel (SPEP&IFE w/QIG)    Standing Status:   Future    Standing Expiration Date:   04/27/2024   Kappa/lambda light chains     Standing Status:   Future    Standing Expiration Date:   04/27/2024   Beta 2 microglobulin, serum    Standing Status:   Future    Standing Expiration Date:   04/27/2024   Follow up in 6 months.  All questions were answered. The patient knows to call the clinic with any problems, questions or concerns.  Rickard Patience, MD, PhD [facility] Hematology Oncology 04/28/2023     HISTORY OF PRESENTING ILLNESS:  82 y.o.  male with PMH listed below who presents to follow up on the evaluation and management of his abnormal urine protein electrophoresis results. I reviewed the records from [facility] and [facility] was performed and HemOnc related medical problems are listed below.  He lives with a friend. He has 3 adult children.   1 Chronic Kidney disease, Stage III: patient follows up with Dr.Lateef.  2 Proteinuria:  02/18/2017: Albumin/Creatinine ratio was 7, Urine protein electrophoresis,Random Urine revealed M Spike 43.2%, total protein of 43.7mg /dl,  09/09/4096 Albumin/Creatinine ratio was 455.2, Albumin 996.5, , 2.15.2018 Albumin/Creatinine ratio was 80.3, Albumin 73.3, Urine protein electrophoresis,Random Urine revealed M Spike 29.7%, total protein of 29.1mg /dl, 06/25/1476 Serum protein electrophoresis did not detect M spike.  Autoimmune disorder work up showed Negative anti dsDNA, RNP antibodies, smith antibody, Sjogren antibodies, anti Jo-1, positive antichromotin antibodies, positive ANA,  3 Anemia of chronic kidney disease:  4 Iron deficiency anemia: history of iron deficiency, ferritin was 7 on 08/11/2016. He had EGD and colonoscopy that were done this year which showed duodenitis/esphagitis/diverticulosis/benigh polyps. # small bowel AVMs was ablated at [facility].    Rheumatoid arthritis: he follows up with Rheumatology and is on chronic MTX and Humaria  reports joint pains are controlled, not quite symptomatic.   Patient denies any persistent  bone pain or any pain. He continue to feel lack of energy, persistent, not improved with resting or taking naps. He also has lost 25 pounds in the past year which was unintentional.    # It is ambiguous whether he has truly symptomatic multiple myeloma or a smoldering myeloma, given that anemia can be secondary to iron deficiency and CKD.  The hypermetabolic 2.1 cm hypermetabolic soft tissue lesion in the left heel, can reflect a Plasmacytoma,which should have been biopsied. However patient initially denies being on any blood thinner, later he was found to be on Plavix with pre biopsy questionnaire screening, and biopsy was held. Patient has to contact cardiologist to hold plavix for seven days before procedure. He feels frustrated about multiple workup and meanwhile he becomes more fatigues, with worsening of kidney function. Patient was reluctant about proceeding additional invasive procedures and wants to start treatment. Decision was made to start active MM treatment.   # He was evaluated by [facility] bone marrow transplant team for autologous bone marrow transplant. He is not considered to  good candidate for transplant. Patient cardiologist Dr. Park Breed had started patient on Lasix 20 mg daily. Lasix was discontinued by [facility] bone marrow transplant team as patient was having dizziness and borderline blood pressure during his clinic visit there.  10/02/2018 He had follow-up left foot/ankle x-ray as a follow-up of his left heel lesion which was initially presented on PET scan and not detectable on follow-up PET scan.  Images were reviewed by me.  No suspicious acute or subacute osseous abnormality  Current Treatment:  S/p RVD x 4 cycles tolerates well except cytopenia.  Revlimid 10 mg PO once per day on days 1 to 14 (dose reduced due to GFR) Revlimid renal dosing of 10mg ,  Velcade 1.3 mg/m2 IV once per day on days 1,  8, 15 Dexamethasone 20 mg PO once per day on days 1, 8, 15 given patient's diabetes Given  patient's multiple comorbidity and the side effects of cytopenia during his RVD treatment and the uncertainty of this is really a site of plasmacytoma or not, I recommend not to continue him on maintenance Revlimid.   # repeat small bowel capsule study on 02/16/2019 Which showed multiple AVMs seen in the mid jejunum proximal to the site of prior tattoo. # Patient was referred to [facility] and he is status post 06/19/2019 double-balloon enteroscopy with findings of 5 AVMs in the jejunum and 2 AVMs in the duodenum and AVMs were ablated with APC.  Tattoo was seen in the area. If further bleeding, recommend to consider treatment with octreotide. Patient takes Protonix daily.    INTERVAL HISTORY Patient presents for follow-up of management of multiple myeloma/smoldering multiple myeloma and iron deficiency, secondary erythrocytosis. Patient continues to smoke.  RA, he is on Humaria and methotrexate.  He recently took a course of tapering prednisone.  Patient has sleep apnea, he uses CPAP machine as much as he can, approximately 2-3 times per week. He has no new complaints.    Review of Systems  Constitutional:  Positive for fatigue. Negative for appetite change, chills, fever  and unexpected weight change.  HENT:   Negative for hearing loss and voice change.   Eyes:  Negative for eye problems and icterus.  Respiratory:  Negative for chest tightness, cough and shortness of breath.   Cardiovascular:  Negative for chest pain and leg swelling.  Gastrointestinal:  Negative for abdominal distention, abdominal pain and diarrhea.  Endocrine: Negative for hot flashes.  Genitourinary:  Negative for difficulty urinating, dysuria and frequency.   Musculoskeletal:  Positive for arthralgias.  Skin:  Negative for itching and rash.  Neurological:  Negative for light-headedness and numbness.  Hematological:  Negative for adenopathy. Does not bruise/bleed easily.  Psychiatric/Behavioral:  Negative for confusion.       MEDICAL HISTORY:  Past Medical History:  Diagnosis Date   AAA (abdominal aortic aneurysm) (HCC)    a.) s/p EVAR 11/05/2013.   Anxiety    a.) on BZO (alprazolam) PRN   Aortic atherosclerosis (HCC)    Atrial fibrillation (HCC)    a.) CHA2DS2-VASc = 6 (age x2, CHF, HTN, PVD/aortic plaque, T2DM). b.) rate/rhythm maintained without use of pharmacological intervention; chronically anticoagulated using dose reduced apixaban.   BPH (benign prostatic hyperplasia)    CHF (congestive heart failure) (HCC)    a.)  TTE 03/16/2021: EF 63%; moderate LVH; mild BAE mild to moderate pan valvular regurgitation; G1DD.   CKD (chronic kidney disease), stage III (HCC)    Colon polyp 07/07/2015   TUBULAR ADENOMA WITH AT LEAST HIGH-GRADE / Dr Shelle Iron   COPD (chronic obstructive pulmonary disease) (HCC)    Coronary artery disease    a.) coronary CTA 01/10/2015 CA score 722.3; mod-sev LAD disease; mild LCx disease; 50-60% RCA disease.   Current use of long term anticoagulation    a.) dose reduced apixaban   Diverticulosis    Erectile dysfunction    GERD (gastroesophageal reflux disease)    Gout    Heart murmur    Hepatic cyst    Hepatic steatosis    Hepatitis    Hypercholesterolemia    Hypertension    Iron deficiency anemia    Migraines    Multiple myeloma (HCC)    Nephrolithiasis    OSA on CPAP    Presence of permanent cardiac pacemaker    Prostate cancer (HCC) 01/01/13, 01/30/14   Gleason 3+4=7, volume 46.6 cc   PVD (peripheral vascular disease) (HCC)    Renal cyst    Rheumatoid arthritis (HCC)    S/P radiation therapy  04/03/2014 through 06/04/2014                                                      Prostate 7800 cGy in 40 sessions                           SSS (sick sinus syndrome) (HCC)    a.) s/p Medtronic Azure PPM placement 08/26/2016   T2DM (type 2 diabetes mellitus) (HCC)    Third degree heart block (HCC) 08/26/2016   a.) s/p PPM placement (Medtronic Azure XT DR MRI [id number] S/N:  [id number]) on 08/26/2016   Vitamin D deficiency     SURGICAL HISTORY: Past Surgical History:  Procedure Laterality Date   ABDOMINAL AORTIC ANEURYSM REPAIR  11/05/2013   COLON SURGERY  08/06/2015   Right hemicolectomy for tubulovillous adenoma with high-grade dysplasia.  COLONOSCOPY WITH PROPOFOL N/A 07/07/2015   Procedure: COLONOSCOPY WITH PROPOFOL;  Surgeon: Elnita Maxwell, MD;  Location: [facility] ENDOSCOPY;  Service: Endoscopy;  Laterality: N/A;   COLONOSCOPY WITH PROPOFOL N/A 10/28/2016   Procedure: COLONOSCOPY WITH PROPOFOL;  Surgeon: Wyline Mood, MD;  Location: [facility] ENDOSCOPY;  Service: Endoscopy;  Laterality: N/A;   CYSTOSCOPY W/ RETROGRADES Bilateral 11/16/2021   Procedure: CYSTOSCOPY WITH RETROGRADE PYELOGRAM;  Surgeon: Vanna Scotland, MD;  Location: [facility] ORS;  Service: Urology;  Laterality: Bilateral;   CYSTOSCOPY WITH BIOPSY N/A 11/16/2021   Procedure: CYSTOSCOPY WITH BLADDER BIOPSY;  Surgeon: Vanna Scotland, MD;  Location: [facility] ORS;  Service: Urology;  Laterality: N/A;   ESOPHAGOGASTRODUODENOSCOPY (EGD) WITH PROPOFOL N/A 10/28/2016   Procedure: ESOPHAGOGASTRODUODENOSCOPY (EGD) WITH PROPOFOL;  Surgeon: Wyline Mood, MD;  Location: [facility] ENDOSCOPY;  Service: Endoscopy;  Laterality: N/A;   GIVENS CAPSULE STUDY N/A 07/25/2017   Procedure: GIVENS CAPSULE STUDY;  Surgeon: Wyline Mood, MD;  Location: [facility] ENDOSCOPY;  Service: Gastroenterology;  Laterality: N/A;   GIVENS CAPSULE STUDY N/A 01/29/2019   Procedure: GIVENS CAPSULE STUDY;  Surgeon: Wyline Mood, MD;  Location: [facility] ENDOSCOPY;  Service: Gastroenterology;  Laterality: N/A;   LAPAROSCOPIC RIGHT COLECTOMY Right 08/08/2015   Procedure: LAPAROSCOPIC RIGHT COLECTOMY;  Surgeon: Earline Mayotte, MD;  Location: [facility] ORS;  Service: General;  Laterality: Right;   NASAL SINUS SURGERY     PACEMAKER INSERTION Left 08/26/2016   Procedure: INSERTION PACEMAKER;  Surgeon: Marcina Millard, MD;  Location: [facility] ORS;  Service:  Cardiovascular;  Laterality: Left;   PROSTATE BIOPSY  01/01/13, 01/30/14   Gleason 3+3=6, vol 46.6 cc   TONSILLECTOMY     uvula surgery     for sleep apnea    SOCIAL HISTORY: Social History   Socioeconomic History   Marital status: Divorced    Spouse name: Not on file   Number of children: Not on file   Years of education: Not on file   Highest education level: Not on file  Occupational History   Not on file  Tobacco Use   Smoking status: Every Day    Current packs/day: 1.00    Average packs/day: 1 pack/day for 60.0 years (60.0 ttl pk-yrs)    Types: Cigarettes   Smokeless tobacco: Never   Tobacco comments:    DOWN TO 1/2 PPD  Vaping Use   Vaping status: Never Used  Substance and Sexual Activity   Alcohol use: Yes    Alcohol/week: 1.0 standard drink of alcohol    Types: 1 Cans of beer per week    Comment: occasionally   Drug use: No   Sexual activity: Not Currently  Other Topics Concern   Not on file  Social History Narrative   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   Social Determinants of Health   Financial Resource Strain: Not on file  Food Insecurity: No Food Insecurity (09/14/2022)   Hunger Vital Sign    Worried About Running Out of Food in the Last Year: Never true    Ran Out of Food in the Last Year: Never true  Transportation Needs: No Transportation Needs (09/14/2022)   PRAPARE - Administrator, Civil Service (Medical): No    Lack of Transportation (Non-Medical): No  Physical Activity: Not on file  Stress: Not on file  Social Connections: Not on file  Intimate Partner Violence: Not on file    FAMILY HISTORY: Family History  Problem Relation Age of Onset   Heart attack Mother    Cirrhosis  Father    Alzheimer's disease Sister    Pancreatic cancer Brother    Diabetes Brother    Diabetes Daughter        medication induced for cancer treatments   Cancer Daughter        breast, brain    ALLERGIES:  has No Known Allergies.  MEDICATIONS:   Current Outpatient Medications  Medication Sig Dispense Refill   albuterol (PROVENTIL HFA;VENTOLIN HFA) 108 (90 Base) MCG/ACT inhaler Inhale 1 puff into the lungs every 6 (six) hours as needed.     allopurinol (ZYLOPRIM) 100 MG tablet Take 1 tablet (100 mg total) by mouth daily. 90 tablet 3   ALPRAZolam (XANAX) 0.25 MG tablet Take by mouth 2 (two) times daily as needed.     atorvastatin (LIPITOR) 80 MG tablet TAKE 1 TABLET BY MOUTH AT BEDTIME 90 tablet 1   Azelastine HCl 137 MCG/SPRAY SOLN Place 1 spray into the nose daily. 30 mL 2   benazepril (LOTENSIN) 10 MG tablet Take 1 tablet (10 mg total) by mouth daily. 90 tablet 0   BREO ELLIPTA 100-25 MCG/ACT AEPB INHALE 1 PUFF BY MOUTH ONCE DAILY AS DIRECTED 180 each 0   cetirizine (ZYRTEC ALLERGY) 10 MG tablet Take 1 tablet (10 mg total) by mouth daily. 30 tablet 2   Cholecalciferol (VITAMIN D3) 1.25 MG (50000 UT) CAPS Take 1 capsule (50,000 Units total) by mouth once a week. 12 capsule 0   ELIQUIS 2.5 MG TABS tablet Take 1 tablet by mouth twice daily 180 tablet 0   empagliflozin (JARDIANCE) 25 MG TABS tablet Take 1 tablet (25 mg total) by mouth every morning. 90 tablet 0   ezetimibe (ZETIA) 10 MG tablet Take 1 tablet by mouth once daily 90 tablet 1   fluticasone (FLONASE) 50 MCG/ACT nasal spray Place 1 spray into both nostrils daily as needed for rhinitis.     folic acid (FOLVITE) 1 MG tablet Take 1 mg by mouth daily.     gabapentin (NEURONTIN) 400 MG capsule TAKE 1 CAPSULE BY MOUTH THREE TIMES DAILY 90 capsule 3   HUMIRA PEN 40 MG/0.4ML PNKT Inject 40 mg into the skin every 14 (fourteen) days.     isosorbide mononitrate (IMDUR) 60 MG 24 hr tablet Take 1 tablet by mouth once daily 90 tablet 0   methotrexate (RHEUMATREX) 2.5 MG tablet Take 10 mg by mouth once a week.     metoprolol succinate (TOPROL-XL) 25 MG 24 hr tablet Take 1 tablet by mouth once daily 90 tablet 0   mupirocin ointment (BACTROBAN) 2 % APPLY TO RIGHT GREAT TOE ONCE DAILY UNTIL  HEALED 66 g 0   pantoprazole (PROTONIX) 20 MG tablet Take 1 tablet (20 mg total) by mouth daily. 90 tablet 0   tamsulosin (FLOMAX) 0.4 MG CAPS capsule Take 1 capsule (0.4 mg total) by mouth daily. 90 capsule 0   traMADol-acetaminophen (ULTRACET) 37.5-325 MG tablet TAKE 1 TABLET BY MOUTH EVERY 6 HOURS AS NEEDED FOR PAIN 30 tablet 0   triamcinolone cream (KENALOG) 0.1 % Apply 1 Application topically 2 (two) times daily. to affected area     No current facility-administered medications for this visit.      Marland Kitchen  PHYSICAL EXAMINATION: ECOG PERFORMANCE STATUS: 1 - Symptomatic but completely ambulatory Vitals:   04/28/23 1104  BP: 118/74  Pulse: (!) 51  Resp: 18  Temp: (!) 96.6 F (35.9 C)   Filed Weights   04/28/23 1104  Weight: 202 lb 1.6 oz (91.7 kg)  Physical Exam Constitutional:      General: He is not in acute distress.    Appearance: He is not diaphoretic.  HENT:     Head: Normocephalic and atraumatic.     Mouth/Throat:     Pharynx: No oropharyngeal exudate.  Eyes:     General: No scleral icterus.       Right eye: No discharge.        Left eye: No discharge.     Pupils: Pupils are equal, round, and reactive to light.  Neck:     Vascular: No JVD.  Cardiovascular:     Rate and Rhythm: Normal rate and regular rhythm.     Heart sounds: Normal heart sounds. No murmur heard. Pulmonary:     Effort: Pulmonary effort is normal. No respiratory distress.     Breath sounds: Normal breath sounds.  Abdominal:     General: Bowel sounds are normal. There is no distension.     Palpations: Abdomen is soft. There is no mass.     Tenderness: There is no abdominal tenderness.  Musculoskeletal:        General: No tenderness. Normal range of motion.     Cervical back: Normal range of motion and neck supple.  Lymphadenopathy:     Cervical: No cervical adenopathy.  Skin:    General: Skin is warm and dry.     Findings: No erythema.     Comments: Hypopigmentation of skin   Neurological:     Mental Status: He is alert and oriented to person, place, and time.     Cranial Nerves: No cranial nerve deficit.  Psychiatric:        Mood and Affect: Affect normal.     LABORATORY DATA:  I have reviewed the data as listed    Latest Ref Rng & Units 04/18/2023    2:04 PM 12/16/2022    2:05 PM 08/05/2022    1:22 PM  CBC  WBC 4.0 - 10.5 K/uL 8.1  8.1  8.5   Hemoglobin 13.0 - 17.0 g/dL 19.1  47.8  29.5   Hematocrit 39.0 - 52.0 % 47.4  46.3  51.9   Platelets 150 - 400 K/uL 221  189  238       Latest Ref Rng & Units 04/18/2023    2:04 PM 04/13/2023   10:48 AM 01/14/2023   10:39 AM  CMP  Glucose 70 - 99 mg/dL 95  90  91   BUN 8 - 23 mg/dL 9  21  16    Creatinine 0.61 - 1.24 mg/dL 6.21  3.08  6.57   Sodium 135 - 145 mmol/L 140  142  143   Potassium 3.5 - 5.1 mmol/L 4.3  4.3  4.6   Chloride 98 - 111 mmol/L 109  106  106   CO2 22 - 32 mmol/L 22  22  24    Calcium 8.9 - 10.3 mg/dL 8.9  9.3  9.6   Total Protein 6.5 - 8.1 g/dL 7.6  7.3  7.2   Total Bilirubin <1.2 mg/dL 1.0  0.5  0.3   Alkaline Phos 38 - 126 U/L 66  81  87   AST 15 - 41 U/L 14  14  19    ALT 0 - 44 U/L 11  11  16       Bone marrow biopsy 03/21/2017  Bone Marrow, Aspirate,Biopsy, and Clot, left iliac - HYPERCELLULAR BONE MARROW FOR AGE WITH TRILINEAGE HEMATOPOIESIS. - PLASMACYTOSIS (PLASMA CELLS 8%)  Karyotype: loss  of Y chromosome Cytogenetic MDS FISH Panel negative.   Bone marrow 04/06/2017  Bone Marrow, Aspirate,Biopsy, and Clot, right iliac and core - HYPERCELLULAR BONE MARROW FOR AGE WITH PLASMA CELL NEOPLASM - TRILINEAGE HEMATOPOIESIS. - SEE COMMENT. PERIPHERAL BLOOD: - MICROCYTIC-HYPOCHROMIC ANEMIA. Diagnosis Note The bone marrow is hypercellular with trilineage hematopoiesis but with relative abundance of erythroid precursors and increased number of megakaryocytes with nonspecific changes. Significant dyspoiesis is not seen. Iron stores are present with no ring sideroblasts. The  plasma cells are increased in number representing 8% of all cells in the aspirate although focal areas in the core biopsy show 10 to 20% as primarily seen by CD138 stain. In situ hybridization for kappa and lambda light chains show kappa light chain restriction consistent with plasma cell neoplasm. Correlation with cytogenetic and FISH studies is recommended. (BNS:ecj/gt 04/07/2017)  IMAGE STUDIES I have personally reviewed below image results.  03/15/2017 DG bone survey Met: Nonspecific lucent lesion in the distal right ulna measuring 15-16 mm, but otherwise normal bone mineralization for age throughout the visible skeleton. A solitary lytic lesion in a distal extremity would be an unusual presentation of multiple myeloma, and I favor the distal right ulna lesion is benign. Recommend correlation with serum and urine protein electrophoresis.  04/14/2017 PET scan: 1. 2.1 cm hypermetabolic soft tissue lesion in the left heel, adjacent to the calcaneal tuberosity. 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 2. Mottled FDG accumulation diffusely in the marrow space without other frankly overt hypermetabolic bony lesion. 3. Coronary artery and thoracoabdominal aortic atherosclerosis with abdominal aortic stent graft visualized in situ. 4. Bilateral renal cysts and bilateral nonobstructing renal stones.   08/24/2017 PET scan 1. No hypermetabolic osseous lesions. No definite signs of multiple myeloma on CT imaging. 2. Interval improvement in soft tissue activity along the medial aspect of the left calcaneal tuberosity, likely plantar fasciitis. 3. New small left pleural effusion. 4. Bilateral inguinal hernias containing small bowel on the right and sigmoid colon on the left. No evidence of incarceration or obstruction. 5. Otherwise stable incidental findings including diffuse atherosclerosis, bilateral renal cysts, emphysema and sigmoid diverticulosis.

## 2023-05-09 ENCOUNTER — Other Ambulatory Visit: Payer: Self-pay | Admitting: Internal Medicine

## 2023-05-11 ENCOUNTER — Ambulatory Visit
Admission: RE | Admit: 2023-05-11 | Discharge: 2023-05-11 | Disposition: A | Discharge status: Home / Self Care | Payer: Medicare PPO | Attending: Oncology | Admitting: Oncology

## 2023-05-11 ENCOUNTER — Ambulatory Visit
Admission: RE | Admit: 2023-05-11 | Discharge: 2023-05-11 | Disposition: A | Discharge status: Home / Self Care | Payer: Medicare PPO | Source: Ambulatory Visit | Attending: Oncology | Admitting: Oncology

## 2023-05-11 DIAGNOSIS — D472 Monoclonal gammopathy: Secondary | ICD-10-CM | POA: Insufficient documentation

## 2023-05-11 DIAGNOSIS — M19011 Primary osteoarthritis, right shoulder: Secondary | ICD-10-CM | POA: Diagnosis not present

## 2023-05-11 DIAGNOSIS — M19012 Primary osteoarthritis, left shoulder: Secondary | ICD-10-CM | POA: Diagnosis not present

## 2023-05-11 DIAGNOSIS — M47819 Spondylosis without myelopathy or radiculopathy, site unspecified: Secondary | ICD-10-CM | POA: Diagnosis not present

## 2023-05-11 DIAGNOSIS — C9000 Multiple myeloma not having achieved remission: Secondary | ICD-10-CM | POA: Diagnosis not present

## 2023-05-12 ENCOUNTER — Other Ambulatory Visit: Payer: Self-pay

## 2023-05-12 ENCOUNTER — Inpatient Hospital Stay: Payer: Medicare PPO | Attending: Oncology

## 2023-05-12 DIAGNOSIS — G4733 Obstructive sleep apnea (adult) (pediatric): Secondary | ICD-10-CM | POA: Diagnosis not present

## 2023-05-12 DIAGNOSIS — Z79631 Long term (current) use of antimetabolite agent: Secondary | ICD-10-CM | POA: Insufficient documentation

## 2023-05-12 DIAGNOSIS — Z79899 Other long term (current) drug therapy: Secondary | ICD-10-CM | POA: Diagnosis not present

## 2023-05-12 DIAGNOSIS — C9000 Multiple myeloma not having achieved remission: Secondary | ICD-10-CM | POA: Insufficient documentation

## 2023-05-12 DIAGNOSIS — M069 Rheumatoid arthritis, unspecified: Secondary | ICD-10-CM | POA: Diagnosis not present

## 2023-05-12 DIAGNOSIS — F17210 Nicotine dependence, cigarettes, uncomplicated: Secondary | ICD-10-CM | POA: Insufficient documentation

## 2023-05-12 DIAGNOSIS — R5383 Other fatigue: Secondary | ICD-10-CM | POA: Insufficient documentation

## 2023-05-12 DIAGNOSIS — R911 Solitary pulmonary nodule: Secondary | ICD-10-CM | POA: Diagnosis not present

## 2023-05-12 DIAGNOSIS — N183 Chronic kidney disease, stage 3 unspecified: Secondary | ICD-10-CM | POA: Insufficient documentation

## 2023-05-12 DIAGNOSIS — C61 Malignant neoplasm of prostate: Secondary | ICD-10-CM | POA: Diagnosis not present

## 2023-05-12 DIAGNOSIS — D472 Monoclonal gammopathy: Secondary | ICD-10-CM

## 2023-05-13 LAB — IFE+PROTEIN ELECTRO, 24-HR UR
% BETA, Urine: 23.3 %
ALPHA 1 URINE: 2.3 %
Albumin, U: 16.3 %
Alpha 2, Urine: 6.5 %
GAMMA GLOBULIN URINE: 51.6 %
M-SPIKE %, Urine: 33.9 % — ABNORMAL HIGH
M-Spike, Mg/24 Hr: 32 mg/(24.h) — ABNORMAL HIGH
Total Protein, Urine-Ur/day: 93 mg/(24.h) (ref 30–150)
Total Protein, Urine: 6.2 mg/dL
Total Volume: 1500

## 2023-05-18 ENCOUNTER — Other Ambulatory Visit: Payer: Self-pay | Admitting: Urology

## 2023-05-18 ENCOUNTER — Other Ambulatory Visit: Payer: Self-pay | Admitting: Cardiovascular Disease

## 2023-05-18 ENCOUNTER — Other Ambulatory Visit: Payer: Self-pay | Admitting: Internal Medicine

## 2023-05-18 DIAGNOSIS — N138 Other obstructive and reflux uropathy: Secondary | ICD-10-CM

## 2023-05-20 DIAGNOSIS — Z0100 Encounter for examination of eyes and vision without abnormal findings: Secondary | ICD-10-CM | POA: Diagnosis not present

## 2023-05-20 DIAGNOSIS — H353131 Nonexudative age-related macular degeneration, bilateral, early dry stage: Secondary | ICD-10-CM | POA: Diagnosis not present

## 2023-05-20 DIAGNOSIS — H2513 Age-related nuclear cataract, bilateral: Secondary | ICD-10-CM | POA: Diagnosis not present

## 2023-05-20 DIAGNOSIS — E119 Type 2 diabetes mellitus without complications: Secondary | ICD-10-CM | POA: Diagnosis not present

## 2023-05-20 DIAGNOSIS — H04123 Dry eye syndrome of bilateral lacrimal glands: Secondary | ICD-10-CM | POA: Diagnosis not present

## 2023-05-26 ENCOUNTER — Other Ambulatory Visit: Payer: Self-pay | Admitting: Internal Medicine

## 2023-06-06 DIAGNOSIS — H2512 Age-related nuclear cataract, left eye: Secondary | ICD-10-CM | POA: Diagnosis not present

## 2023-06-11 ENCOUNTER — Other Ambulatory Visit: Payer: Self-pay | Admitting: Internal Medicine

## 2023-06-14 ENCOUNTER — Other Ambulatory Visit: Payer: Self-pay | Admitting: Cardiovascular Disease

## 2023-06-16 ENCOUNTER — Encounter: Payer: Self-pay | Admitting: Ophthalmology

## 2023-06-20 ENCOUNTER — Encounter: Payer: Self-pay | Admitting: Podiatry

## 2023-06-20 ENCOUNTER — Ambulatory Visit: Payer: Medicare PPO | Admitting: Podiatry

## 2023-06-20 VITALS — Ht 71.0 in | Wt 210.0 lb

## 2023-06-20 DIAGNOSIS — I739 Peripheral vascular disease, unspecified: Secondary | ICD-10-CM

## 2023-06-20 DIAGNOSIS — M79609 Pain in unspecified limb: Secondary | ICD-10-CM

## 2023-06-20 DIAGNOSIS — E1140 Type 2 diabetes mellitus with diabetic neuropathy, unspecified: Secondary | ICD-10-CM | POA: Diagnosis not present

## 2023-06-20 DIAGNOSIS — B351 Tinea unguium: Secondary | ICD-10-CM | POA: Diagnosis not present

## 2023-06-22 ENCOUNTER — Ambulatory Visit
Admission: RE | Admit: 2023-06-22 | Discharge: 2023-06-22 | Disposition: A | Discharge status: Home / Self Care | Payer: Medicare PPO | Attending: Ophthalmology | Admitting: Ophthalmology

## 2023-06-22 ENCOUNTER — Ambulatory Visit: Payer: Self-pay | Admitting: General Practice

## 2023-06-22 ENCOUNTER — Encounter
Admission: RE | Disposition: A | Discharge status: Home / Self Care | Payer: Self-pay | Source: Home / Self Care | Attending: Ophthalmology

## 2023-06-22 ENCOUNTER — Encounter: Payer: Self-pay | Admitting: Ophthalmology

## 2023-06-22 ENCOUNTER — Other Ambulatory Visit: Payer: Self-pay

## 2023-06-22 DIAGNOSIS — E1122 Type 2 diabetes mellitus with diabetic chronic kidney disease: Secondary | ICD-10-CM | POA: Insufficient documentation

## 2023-06-22 DIAGNOSIS — E1136 Type 2 diabetes mellitus with diabetic cataract: Secondary | ICD-10-CM | POA: Diagnosis not present

## 2023-06-22 DIAGNOSIS — Z8249 Family history of ischemic heart disease and other diseases of the circulatory system: Secondary | ICD-10-CM | POA: Insufficient documentation

## 2023-06-22 DIAGNOSIS — F17210 Nicotine dependence, cigarettes, uncomplicated: Secondary | ICD-10-CM | POA: Insufficient documentation

## 2023-06-22 DIAGNOSIS — I509 Heart failure, unspecified: Secondary | ICD-10-CM | POA: Diagnosis not present

## 2023-06-22 DIAGNOSIS — I130 Hypertensive heart and chronic kidney disease with heart failure and stage 1 through stage 4 chronic kidney disease, or unspecified chronic kidney disease: Secondary | ICD-10-CM | POA: Insufficient documentation

## 2023-06-22 DIAGNOSIS — Z7984 Long term (current) use of oral hypoglycemic drugs: Secondary | ICD-10-CM | POA: Diagnosis not present

## 2023-06-22 DIAGNOSIS — I4891 Unspecified atrial fibrillation: Secondary | ICD-10-CM | POA: Diagnosis not present

## 2023-06-22 DIAGNOSIS — N183 Chronic kidney disease, stage 3 unspecified: Secondary | ICD-10-CM | POA: Diagnosis not present

## 2023-06-22 DIAGNOSIS — H2512 Age-related nuclear cataract, left eye: Secondary | ICD-10-CM | POA: Insufficient documentation

## 2023-06-22 HISTORY — DX: Rheumatic tricuspid insufficiency: I07.1

## 2023-06-22 HISTORY — PX: CATARACT EXTRACTION W/PHACO: SHX586

## 2023-06-22 HISTORY — DX: Other ill-defined heart diseases: I51.89

## 2023-06-22 LAB — GLUCOSE, CAPILLARY: Glucose-Capillary: 101 mg/dL — ABNORMAL HIGH (ref 70–99)

## 2023-06-22 SURGERY — PHACOEMULSIFICATION, CATARACT, WITH IOL INSERTION
Anesthesia: Monitor Anesthesia Care | Site: Eye | Laterality: Left

## 2023-06-22 MED ORDER — MIDAZOLAM HCL 2 MG/2ML IJ SOLN
INTRAMUSCULAR | Status: AC
Start: 2023-06-22 — End: ?
  Filled 2023-06-22: qty 2

## 2023-06-22 MED ORDER — ARMC OPHTHALMIC DILATING DROPS
1.0000 | OPHTHALMIC | Status: DC | PRN
  Administered 2023-06-22 (×3): 1 via OPHTHALMIC

## 2023-06-22 MED ORDER — TETRACAINE HCL 0.5 % OP SOLN
1.0000 [drp] | OPHTHALMIC | Status: DC | PRN
Start: 2023-06-22 — End: 2023-06-22
  Administered 2023-06-22 (×3): 1 [drp] via OPHTHALMIC

## 2023-06-22 MED ORDER — FENTANYL CITRATE (PF) 100 MCG/2ML IJ SOLN
INTRAMUSCULAR | Status: AC
  Filled 2023-06-22: qty 2

## 2023-06-22 MED ORDER — SIGHTPATH DOSE#1 BSS IO SOLN
INTRAOCULAR | Status: DC | PRN
  Administered 2023-06-22: 67 mL via OPHTHALMIC

## 2023-06-22 MED ORDER — MIDAZOLAM HCL 5 MG/5ML IJ SOLN
INTRAMUSCULAR | Status: DC | PRN
  Administered 2023-06-22: 1 mg via INTRAVENOUS

## 2023-06-22 MED ORDER — FENTANYL CITRATE (PF) 100 MCG/2ML IJ SOLN
INTRAMUSCULAR | Status: DC | PRN
  Administered 2023-06-22: 50 ug via INTRAVENOUS

## 2023-06-22 MED ORDER — CEFUROXIME OPHTHALMIC INJECTION 1 MG/0.1 ML
INJECTION | OPHTHALMIC | Status: DC | PRN
  Administered 2023-06-22: .1 mL via INTRACAMERAL

## 2023-06-22 MED ORDER — SIGHTPATH DOSE#1 BSS IO SOLN
INTRAOCULAR | Status: DC | PRN
  Administered 2023-06-22: 1 mL via INTRAMUSCULAR

## 2023-06-22 MED ORDER — BRIMONIDINE TARTRATE-TIMOLOL 0.2-0.5 % OP SOLN
OPHTHALMIC | Status: DC | PRN
  Administered 2023-06-22: 1 [drp] via OPHTHALMIC

## 2023-06-22 MED ORDER — SIGHTPATH DOSE#1 NA HYALUR & NA CHOND-NA HYALUR IO KIT
PACK | INTRAOCULAR | Status: DC | PRN
  Administered 2023-06-22: 1 via OPHTHALMIC

## 2023-06-22 MED ORDER — TETRACAINE HCL 0.5 % OP SOLN
OPHTHALMIC | Status: AC
  Filled 2023-06-22: qty 4

## 2023-06-22 SURGICAL SUPPLY — 9 items
CATARACT SUITE SIGHTPATH (MISCELLANEOUS) ×1
FEE CATARACT SUITE SIGHTPATH (MISCELLANEOUS) ×1 IMPLANT
GLOVE SRG 8 PF TXTR STRL LF DI (GLOVE) ×1 IMPLANT
GLOVE SURG ENC TEXT LTX SZ7.5 (GLOVE) ×1 IMPLANT
LENS CLRN VIVITY TORIC 4 20.5 ×1 IMPLANT
LENS IOL CLRN VT TRC 4 20.5 IMPLANT
NDL FILTER BLUNT 18X1 1/2 (NEEDLE) ×1 IMPLANT
NEEDLE FILTER BLUNT 18X1 1/2 (NEEDLE) ×1
SYR 3ML LL SCALE MARK (SYRINGE) ×1 IMPLANT

## 2023-06-22 NOTE — Transfer of Care (Signed)
 Immediate Anesthesia Transfer of Care Note  Patient: Omar [name]  Procedure(s) Performed: CATARACT EXTRACTION PHACO AND INTRAOCULAR LENS PLACEMENT (IOC) LEFT  CLAREON VIVITY TORIC  17.82  01:15.5 (Left: Eye)  Patient Location: PACU  Anesthesia Type: MAC  Level of Consciousness: awake, alert  and patient cooperative  Airway and Oxygen Therapy: Patient Spontanous Breathing and Patient connected to supplemental oxygen  Post-op Assessment: Post-op Vital signs reviewed, Patient's Cardiovascular Status Stable, Respiratory Function Stable, Patent Airway and No signs of Nausea or vomiting  Post-op Vital Signs: Reviewed and stable  Complications: No notable events documented.

## 2023-06-22 NOTE — Op Note (Signed)
 LOCATION:  [facility]   PREOPERATIVE DIAGNOSIS:  Nuclear sclerotic cataract of the left eye.  H25.12  POSTOPERATIVE DIAGNOSIS:  Nuclear sclerotic cataract of the left eye.   PROCEDURE:  Phacoemulsification with Toric posterior chamber intraocular lens placement of the left eye.  Ultrasound time: Procedure(s): CATARACT EXTRACTION PHACO AND INTRAOCULAR LENS PLACEMENT (IOC) LEFT  CLAREON VIVITY TORIC  17.82  01:15.5 (Left)  LENS:   Implant Name Type Inv. Item Serial No. Manufacturer Lot No. LRB No. Used Action  LENS CLRN VIVITY TORIC 4 20.5 - [id number]  LENS CLRN VIVITY TORIC 4 20.5 [id number] SIGHT[employer]  Left 1 Implanted     [id number] Vivity Toric intraocular lens with 2.25 diopters of cylindrical power with axis orientation at 3 degrees.     SURGEON:  Berline Brenner, MD   ANESTHESIA:  Topical with tetracaine  drops and 2% Xylocaine  jelly, augmented with 1% preservative-free intracameral lidocaine .  COMPLICATIONS:  None.   DESCRIPTION OF PROCEDURE:  The patient was identified in the holding room and transported to the operating suite and placed in the supine position under the operating microscope.  The left eye was identified as the operative eye, and it was prepped and draped in the usual sterile ophthalmic fashion.    A clear-corneal paracentesis incision was made at the 1:30 position.  0.5 ml of preservative-free 1% lidocaine  was injected into the anterior chamber. The anterior chamber was filled with Viscoat.  A 2.4 millimeter near clear corneal incision was then made at the 10:30 position.  A cystotome and capsulorrhexis forceps were then used to make a curvilinear capsulorrhexis.  Hydrodissection and hydrodelineation were then performed using balanced salt solution.   Phacoemulsification was then used in stop and chop fashion to remove the lens, nucleus and epinucleus.  The remaining cortex was aspirated using the irrigation and aspiration handpiece.  Provisc  viscoelastic was then placed into the capsular bag to distend it for lens placement.  The Verion digital marker was used to align the implant at the intended axis.   A Toric lens was then injected into the capsular bag.  It was rotated clockwise until the axis marks on the lens were approximately 15 degrees in the counterclockwise direction to the intended alignment.  The viscoelastic was aspirated from the eye using the irrigation aspiration handpiece.  Then, a Koch spatula through the sideport incision was used to rotate the lens in a clockwise direction until the axis markings of the intraocular lens were lined up with the Verion alignment.  Balanced salt solution was then used to hydrate the wounds. Cefuroxime  0.1 ml of a 10mg /ml solution was injected into the anterior chamber for a dose of 1 mg of intracameral antibiotic at the completion of the case.    The eye was noted to have a physiologic pressure and there was no wound leak noted.   Timolol  and Brimonidine  drops were applied to the eye.  The patient was taken to the recovery room in stable condition having had no complications of anesthesia or surgery.  Maryrose Colvin 06/22/2023, 11:48 AM

## 2023-06-22 NOTE — H&P (Signed)
 [facility]   Primary Care Physician:  Shari Daughters, MD Ophthalmologist: Dr. Annell Kidney  Pre-Procedure History & Physical: HPI:  Omar [name] is a 83 y.o. male here for ophthalmic surgery.   Past Medical History:  Diagnosis Date   AAA (abdominal aortic aneurysm) (HCC)    a.) s/p EVAR 11/05/2013.   Anxiety    a.) on BZO (alprazolam ) PRN   Aortic atherosclerosis (HCC)    Atrial fibrillation (HCC)    a.) CHA2DS2-VASc = 6 (age x2, CHF, HTN, PVD/aortic plaque, T2DM). b.) rate/rhythm maintained without use of pharmacological intervention; chronically anticoagulated using dose reduced apixaban.   BPH (benign prostatic hyperplasia)    CHF (congestive heart failure) (HCC)    a.)  TTE 03/16/2021: EF 63%; moderate LVH; mild BAE mild to moderate pan valvular regurgitation; G1DD.   CKD (chronic kidney disease), stage III (HCC)    Colon polyp 07/07/2015   TUBULAR ADENOMA WITH AT LEAST HIGH-GRADE / Dr Leatrice Provost   COPD (chronic obstructive pulmonary disease) (HCC)    Coronary artery disease    a.) coronary CTA 01/10/2015 CA score 722.3; mod-sev LAD disease; mild LCx disease; 50-60% RCA disease.   Current use of long term anticoagulation    a.) dose reduced apixaban   Diverticulosis    Erectile dysfunction    GERD (gastroesophageal reflux disease)    Gout    Heart murmur    Hepatic cyst    Hepatic steatosis    Hepatitis    Hypercholesterolemia    Hypertension    Iron  deficiency anemia    Migraines    Multiple myeloma (HCC)    Nephrolithiasis    OSA on CPAP    Presence of permanent cardiac pacemaker    Prostate cancer (HCC) 01/01/13, 01/30/14   Gleason 3+4=7, volume 46.6 cc   PVD (peripheral vascular disease) (HCC)    Renal cyst    Rheumatoid arthritis (HCC)    S/P radiation therapy  04/03/2014 through 06/04/2014                                                      Prostate 7800 cGy in 40 sessions                           SSS (sick sinus syndrome) (HCC)     a.) s/p Medtronic Azure PPM placement 08/26/2016   T2DM (type 2 diabetes mellitus) (HCC)    Third degree heart block (HCC) 08/26/2016   a.) s/p PPM placement (Medtronic Azure XT DR MRI [id number] S/N: [id number]) on 08/26/2016   Vitamin D deficiency     Past Surgical History:  Procedure Laterality Date   ABDOMINAL AORTIC ANEURYSM REPAIR  11/05/2013   COLON SURGERY  08/06/2015   Right hemicolectomy for tubulovillous adenoma with high-grade dysplasia.   COLONOSCOPY WITH PROPOFOL  N/A 07/07/2015   Procedure: COLONOSCOPY WITH PROPOFOL ;  Surgeon: Luella Sager, MD;  Location: [facility] ENDOSCOPY;  Service: Endoscopy;  Laterality: N/A;   COLONOSCOPY WITH PROPOFOL  N/A 10/28/2016   Procedure: COLONOSCOPY WITH PROPOFOL ;  Surgeon: Luke Salaam, MD;  Location: [facility] ENDOSCOPY;  Service: Endoscopy;  Laterality: N/A;   CYSTOSCOPY W/ RETROGRADES Bilateral 11/16/2021   Procedure: CYSTOSCOPY WITH RETROGRADE PYELOGRAM;  Surgeon: Dustin Gimenez, MD;  Location: [facility] ORS;  Service: Urology;  Laterality: Bilateral;  CYSTOSCOPY WITH BIOPSY N/A 11/16/2021   Procedure: CYSTOSCOPY WITH BLADDER BIOPSY;  Surgeon: Dustin Gimenez, MD;  Location: [facility] ORS;  Service: Urology;  Laterality: N/A;   ESOPHAGOGASTRODUODENOSCOPY (EGD) WITH PROPOFOL  N/A 10/28/2016   Procedure: ESOPHAGOGASTRODUODENOSCOPY (EGD) WITH PROPOFOL ;  Surgeon: Luke Salaam, MD;  Location: [facility] ENDOSCOPY;  Service: Endoscopy;  Laterality: N/A;   GIVENS CAPSULE STUDY N/A 07/25/2017   Procedure: GIVENS CAPSULE STUDY;  Surgeon: Luke Salaam, MD;  Location: [facility] ENDOSCOPY;  Service: Gastroenterology;  Laterality: N/A;   GIVENS CAPSULE STUDY N/A 01/29/2019   Procedure: GIVENS CAPSULE STUDY;  Surgeon: Luke Salaam, MD;  Location: [facility] ENDOSCOPY;  Service: Gastroenterology;  Laterality: N/A;   LAPAROSCOPIC RIGHT COLECTOMY Right 08/08/2015   Procedure: LAPAROSCOPIC RIGHT COLECTOMY;  Surgeon: Marshall Skeeter, MD;  Location: [facility] ORS;  Service: General;  Laterality:  Right;   NASAL SINUS SURGERY     PACEMAKER INSERTION Left 08/26/2016   Procedure: INSERTION PACEMAKER;  Surgeon: Percival Brace, MD;  Location: [facility] ORS;  Service: Cardiovascular;  Laterality: Left;   PROSTATE BIOPSY  01/01/13, 01/30/14   Gleason 3+3=6, vol 46.6 cc   TONSILLECTOMY     uvula surgery     for sleep apnea    Prior to Admission medications   Medication Sig Start Date End Date Taking? Authorizing Provider  albuterol  (PROVENTIL  HFA;VENTOLIN  HFA) 108 (90 Base) MCG/ACT inhaler Inhale 1 puff into the lungs every 6 (six) hours as needed. 06/17/18  Yes [provider]  allopurinol  (ZYLOPRIM ) 100 MG tablet Take 1 tablet (100 mg total) by mouth daily. 02/02/23  Yes Shari Daughters, MD  ALPRAZolam  (XANAX ) 0.25 MG tablet Take by mouth 2 (two) times daily as needed.   Yes [provider]  atorvastatin  (LIPITOR) 80 MG tablet TAKE 1 TABLET BY MOUTH AT BEDTIME 05/27/23  Yes Tejan-Sie, S Ahmed, MD  Azelastine  HCl 137 MCG/SPRAY SOLN Place 1 spray into the nose daily. 04/13/23 07/12/23 Yes Shari Daughters, MD  benazepril  (LOTENSIN ) 10 MG tablet Take 1 tablet (10 mg total) by mouth daily. 04/13/23  Yes Shari Daughters, MD  BREO ELLIPTA  100-25 MCG/ACT AEPB INHALE 1 PUFF BY MOUTH ONCE DAILY AS DIRECTED 05/09/23  Yes Tejan-Sie, John Muzzy, MD  Cholecalciferol (VITAMIN D3) 1.25 MG (50000 UT) CAPS Take 1 capsule (50,000 Units total) by mouth once a week. 04/07/23  Yes Shari Daughters, MD  ELIQUIS 2.5 MG TABS tablet Take 1 tablet by mouth twice daily 05/18/23  Yes Cherrie Cornwall, MD  empagliflozin  (JARDIANCE ) 25 MG TABS tablet Take 1 tablet (25 mg total) by mouth every morning. 04/13/23  Yes Shari Daughters, MD  ezetimibe (ZETIA) 10 MG tablet Take 1 tablet by mouth once daily 04/01/23  Yes Tejan-Sie, S Ahmed, MD  fluticasone  (FLONASE ) 50 MCG/ACT nasal spray Place 1 spray into both nostrils daily as needed for rhinitis.   Yes [provider]  folic acid  (FOLVITE ) 1 MG  tablet Take 1 mg by mouth daily. 02/16/23  Yes [provider]  gabapentin  (NEURONTIN ) 400 MG capsule TAKE 1 CAPSULE BY MOUTH THREE TIMES DAILY 06/11/23  Yes Tejan-Sie, S Ahmed, MD  HUMIRA PEN 40 MG/0.4ML PNKT Inject 40 mg into the skin every 14 (fourteen) days. 03/27/20  Yes [provider]  isosorbide  mononitrate (IMDUR ) 60 MG 24 hr tablet Take 1 tablet by mouth once daily 06/14/23  Yes Debborah Fairly A, MD  methotrexate  (RHEUMATREX) 2.5 MG tablet Take 10 mg by mouth once a week.   Yes [provider]  metoprolol succinate (TOPROL-XL) 25 MG 24 hr tablet Take 1 tablet by mouth once daily 05/18/23  Yes Debborah Fairly A, MD  mupirocin  ointment (BACTROBAN ) 2 % APPLY TO RIGHT GREAT TOE ONCE DAILY UNTIL HEALED 02/07/23  Yes Standiford, Karlene Overcast, DPM  pantoprazole  (PROTONIX ) 20 MG tablet Take 1 tablet (20 mg total) by mouth daily. 04/13/23  Yes Shari Daughters, MD  tamsulosin  (FLOMAX ) 0.4 MG CAPS capsule Take 1 capsule by mouth once daily 05/18/23  Yes Dustin Gimenez, MD  traMADol -acetaminophen  (ULTRACET ) 37.5-325 MG tablet TAKE 1 TABLET BY MOUTH EVERY 6 HOURS AS NEEDED FOR PAIN 05/20/23  Yes Shari Daughters, MD  triamcinolone  cream (KENALOG ) 0.1 % Apply 1 Application topically 2 (two) times daily. to affected area 06/30/22  Yes [provider]    Allergies as of 05/24/2023   (No Known Allergies)    Family History  Problem Relation Age of Onset   Heart attack Mother    Cirrhosis Father    Alzheimer's disease Sister    Pancreatic cancer Brother    Diabetes Brother    Diabetes Daughter        medication induced for cancer treatments   Cancer Daughter        breast, brain    Social History   Socioeconomic History   Marital status: Divorced    Spouse name: Not on file   Number of children: Not on file   Years of education: Not on file   Highest education level: Not on file  Occupational History   Not on file  Tobacco Use   Smoking status: Every Day     Current packs/day: 1.00    Average packs/day: 1 pack/day for 61.0 years (61.0 ttl pk-yrs)    Types: Cigarettes    Start date: 1964   Smokeless tobacco: Never   Tobacco comments:    DOWN TO 1/2 PPD  Vaping Use   Vaping status: Never Used  Substance and Sexual Activity   Alcohol use: Yes    Alcohol/week: 1.0 standard drink of alcohol    Types: 1 Cans of beer per week    Comment: occasionally   Drug use: No   Sexual activity: Not Currently  Other Topics Concern   Not on file  Social History Narrative   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   Social Drivers of Health   Financial Resource Strain: Not on file  Food Insecurity: No Food Insecurity (09/14/2022)   Hunger Vital Sign    Worried About Running Out of Food in the Last Year: Never true    Ran Out of Food in the Last Year: Never true  Transportation Needs: No Transportation Needs (09/14/2022)   PRAPARE - Administrator, Civil Service (Medical): No    Lack of Transportation (Non-Medical): No  Physical Activity: Not on file  Stress: Not on file  Social Connections: Not on file  Intimate Partner Violence: Not on file    Review of Systems: See HPI, otherwise negative ROS  Physical Exam: BP 121/78   Pulse 91   Temp (!) 97.2 F (36.2 C) (Temporal)   Resp 20   Ht 5\' 11"  (1.803 m)   Wt 96.6 kg   SpO2 97%   BMI 29.71 kg/m  General:   Alert,  pleasant and cooperative in NAD Head:  Normocephalic and atraumatic. Lungs:  Clear to auscultation.    Heart:  Regular rate and rhythm.   Impression/Plan: Omar [name] is here for ophthalmic surgery.  Risks, benefits, limitations, and alternatives regarding ophthalmic surgery have been reviewed with the patient.  Questions have been answered.  All parties agreeable.   Annell Kidney, MD  06/22/2023, 10:46 AM

## 2023-06-22 NOTE — Anesthesia Postprocedure Evaluation (Signed)
 Anesthesia Post Note  Patient: Omar [name]  Procedure(s) Performed: CATARACT EXTRACTION PHACO AND INTRAOCULAR LENS PLACEMENT (IOC) LEFT  CLAREON VIVITY TORIC  17.82  01:15.5 (Left: Eye)  Patient location during evaluation: PACU Anesthesia Type: MAC Level of consciousness: awake and alert Pain management: pain level controlled Vital Signs Assessment: post-procedure vital signs reviewed and stable Respiratory status: spontaneous breathing, nonlabored ventilation, respiratory function stable and patient connected to nasal cannula oxygen Cardiovascular status: stable and blood pressure returned to baseline Postop Assessment: no apparent nausea or vomiting Anesthetic complications: no   No notable events documented.   Last Vitals:  Vitals:   06/22/23 1150 06/22/23 1155  BP: 117/73 119/79  Pulse: (!) 58 (!) 59  Resp: 14 18  Temp:  (!) 36.4 C  SpO2: 96% 96%    Last Pain:  Vitals:   06/22/23 1155  TempSrc:   PainSc: 0-No pain                 Ciro Tashiro C Charm Stenner

## 2023-06-22 NOTE — Anesthesia Preprocedure Evaluation (Addendum)
 Anesthesia Evaluation  Patient identified by MRN, date of birth, ID band Patient awake    Reviewed: Allergy & Precautions, H&P , NPO status , Patient's Chart, lab work & pertinent test results  Airway Mallampati: IV  TM Distance: <3 FB Neck ROM: Full  Mouth opening: Limited Mouth Opening  Dental no notable dental hx. (+) Poor Dentition, Missing None loose:   Pulmonary shortness of breath, sleep apnea , COPD, Current Smoker   breath sounds clear to auscultation + decreased breath sounds      Cardiovascular hypertension, + CAD, + Past MI, + Peripheral Vascular Disease and +CHF  Normal cardiovascular exam+ dysrhythmias + pacemaker + Valvular Problems/Murmurs  Rhythm:Regular Rate:Normal  08-17-16 Left ventricle: The cavity size was normal. Wall thickness was    normal. Systolic function was normal. The estimated ejection    fraction was in the range of 60% to 65%. Doppler parameters are    consistent with abnormal left ventricular relaxation (grade 1    diastolic dysfunction).  - Ventricular septum: The contour showed diastolic flattening.  - Right ventricle: The cavity size was severely dilated.  - Tricuspid valve: There was moderate regurgitation.   Impressions:   - normal overall left ventricular function    Ejection fraction greater than 60%    Septal flattening suggestive of increase pressure and/or volume    of the right side    dilated RV moderate to severe    Increase pressure on the right side suggestive pulmonary    hypertension.   Infrarenal AAA 4.7 x 4.4 cm 07-27-16 is supposed to go this month for aneurysm check. Thinks he had pacer checked "sometime last year," but isn't certain   Neuro/Psych  Headaches  Anxiety      Neuromuscular disease negative neurological ROS  negative psych ROS   GI/Hepatic negative GI ROS, Neg liver ROS,GERD  ,,(+) Hepatitis -  Endo/Other  diabetes    Renal/GU Renal diseasenegative Renal  ROS  negative genitourinary   Musculoskeletal negative musculoskeletal ROS (+) Arthritis ,    Abdominal   Peds negative pediatric ROS (+)  Hematology negative hematology ROS (+) Blood dyscrasia, anemia   Anesthesia Other Findings Anxiety GERD (gastroesophageal reflux disease) Hypertension Hypercholesterolemia Atrial fibrillation (HCC) Rheumatoid arthritis (HCC) S/P radiation therapy T2DM (type 2 diabetes mellitus) (HCC) Colon polyp Prostate cancer (HCC) Third degree heart block (HCC) Heart murmur OSA on CPAP Presence of permanent cardiac pacemaker CKD (chronic kidney disease), stage III (HCC) Gout Coronary artery disease  COPD (chronic obstructive pulmonary disease) moderate to severe Multiple myeloma (HCC) Iron  deficiency anemia CHF (congestive heart failure) (HCC) Hepatitis Erectile dysfunction Nephrolithiasis PVD (peripheral vascular disease) (HCC) Diverticulosis Hepatic steatosis Hepatic cyst Renal cyst Current use of long term anticoagulation Aortic atherosclerosis (HCC) BPH (benign prostatic hyperplasia) AAA (abdominal aortic aneurysm) (HCC) Migraines Vitamin D deficiency SSS (sick sinus syndrome) (HCC)    Reproductive/Obstetrics negative OB ROS                              Anesthesia Physical Anesthesia Plan  ASA: 4  Anesthesia Plan: MAC   Post-op Pain Management:    Induction: Intravenous  PONV Risk Score and Plan:   Airway Management Planned: Natural Airway and Nasal Cannula  Additional Equipment:   Intra-op Plan:   Post-operative Plan:   Informed Consent: I have reviewed the patients History and Physical, chart, labs and discussed the procedure including the risks, benefits and alternatives for the proposed anesthesia  with the patient or authorized representative who has indicated his/her understanding and acceptance.     Dental Advisory Given  Plan Discussed with: Anesthesiologist, CRNA and Surgeon  Anesthesia Plan  Comments: (Patient consented for risks of anesthesia including but not limited to:  - adverse reactions to medications - damage to eyes, teeth, lips or other oral mucosa - nerve damage due to positioning  - sore throat or hoarseness - Damage to heart, brain, nerves, lungs, other parts of body or loss of life  Patient voiced understanding and assent.)         Anesthesia Quick Evaluation

## 2023-06-22 NOTE — Discharge Instructions (Signed)

## 2023-06-23 ENCOUNTER — Encounter: Payer: Self-pay | Admitting: Ophthalmology

## 2023-06-23 DIAGNOSIS — H2511 Age-related nuclear cataract, right eye: Secondary | ICD-10-CM | POA: Diagnosis not present

## 2023-06-26 ENCOUNTER — Other Ambulatory Visit: Payer: Self-pay | Admitting: Internal Medicine

## 2023-06-27 NOTE — Progress Notes (Signed)
  Subjective:  Patient ID: Omar [name], male    DOB: 01-Dec-1940,  MRN: [id number]  Omar [name] presents to clinic today for at risk footcare. Patient has h/o diabetes, neuropathy and PAD and is seen for  and painful thick toenails that are difficult to trim. Pain interferes with ambulation. Aggravating factors include wearing enclosed shoe gear. Pain is relieved with periodic professional debridement.  Chief Complaint  Patient presents with   Nail Problem    Pt is here for [facility] unsure of last A1C PCP is Dr Ellsworth Lennox and LOV was in November.   New problem(s): None.   PCP is Sherron Monday, MD.  No Known Allergies  Review of Systems: Negative except as noted in the HPI.  Objective: No changes noted in today's physical examination. There were no vitals filed for this visit. Omar [name] is a pleasant 83 y.o. male WD, WN in NAD. AAO x 3.  Vascular Examination: CFT <4 seconds b/l. Pedal pulses diminished b/l. Digital hair absent. Skin temperature gradient warm to cool b/l. No ischemia or gangrene. No cyanosis or clubbing noted b/l.    Neurological Examination: Pt has subjective symptoms of neuropathy. Sensation grossly intact b/l with 10 gram monofilament. Vibratory sensation intact b/l.   Dermatological Examination: Pedal skin thin, shiny and atrophic b/l. No open wounds. No interdigital macerations.   Toenails 1-5 b/l thick, discolored, elongated with subungual debris and pain on dorsal palpation.   No corns, calluses nor porokeratotic lesions noted.  Musculoskeletal Examination: Muscle strength 5/5 to all lower extremity muscle groups bilaterally. Hammertoe right second toe.  Radiographs: None  Last A1c:      Latest Ref Rng & Units 04/13/2023   10:48 AM 01/14/2023   10:43 AM 10/07/2022    1:53 PM  Hemoglobin A1C  Hemoglobin-A1c 4.8 - 5.6 % 6.1  6.4  6.3    Assessment/Plan: 1. Pain due to onychomycosis of nail   2. PAD (peripheral artery disease) (HCC)    3. Type 2 diabetes mellitus with diabetic neuropathy, unspecified whether long term insulin use (HCC)     Patient was evaluated and treated. All patient's and/or POA's questions/concerns addressed on today's visit. Mycotic toenails 1-5 debrided in length and girth without incident. Continue soft, supportive shoe gear daily. Report any pedal injuries to medical professional. Call office if there are any quesitons/concerns. -Continue foot and shoe inspections daily. Monitor blood glucose per PCP/Endocrinologist's recommendations. -Patient/POA to call should there be question/concern in the interim.   Return in about 3 months (around 09/18/2023).  Freddie Breech, DPM      [facility] LOCATION: [address], [place] [address]                   Office [phone]   [facility] LOCATION: [address] [place], [place] [address] Office [phone]

## 2023-06-28 ENCOUNTER — Other Ambulatory Visit (INDEPENDENT_AMBULATORY_CARE_PROVIDER_SITE_OTHER): Payer: Self-pay | Admitting: Vascular Surgery

## 2023-06-28 ENCOUNTER — Other Ambulatory Visit: Payer: Self-pay | Admitting: Internal Medicine

## 2023-06-28 DIAGNOSIS — I7143 Infrarenal abdominal aortic aneurysm, without rupture: Secondary | ICD-10-CM

## 2023-07-04 ENCOUNTER — Ambulatory Visit (INDEPENDENT_AMBULATORY_CARE_PROVIDER_SITE_OTHER): Payer: Medicare PPO

## 2023-07-04 ENCOUNTER — Encounter (INDEPENDENT_AMBULATORY_CARE_PROVIDER_SITE_OTHER): Payer: Self-pay | Admitting: Nurse Practitioner

## 2023-07-04 ENCOUNTER — Ambulatory Visit (INDEPENDENT_AMBULATORY_CARE_PROVIDER_SITE_OTHER): Payer: Medicare PPO | Admitting: Nurse Practitioner

## 2023-07-04 VITALS — BP 107/72 | HR 86 | Resp 16 | Wt 211.0 lb

## 2023-07-04 DIAGNOSIS — I10 Essential (primary) hypertension: Secondary | ICD-10-CM | POA: Diagnosis not present

## 2023-07-04 DIAGNOSIS — I714 Abdominal aortic aneurysm, without rupture, unspecified: Secondary | ICD-10-CM | POA: Diagnosis not present

## 2023-07-04 DIAGNOSIS — I7143 Infrarenal abdominal aortic aneurysm, without rupture: Secondary | ICD-10-CM

## 2023-07-04 DIAGNOSIS — E1140 Type 2 diabetes mellitus with diabetic neuropathy, unspecified: Secondary | ICD-10-CM

## 2023-07-04 NOTE — Discharge Instructions (Signed)

## 2023-07-05 ENCOUNTER — Encounter (INDEPENDENT_AMBULATORY_CARE_PROVIDER_SITE_OTHER): Payer: Self-pay | Admitting: Nurse Practitioner

## 2023-07-05 NOTE — Progress Notes (Signed)
Subjective:    Patient ID: Omar [name], male    DOB: November 28, 1940, 83 y.o.   MRN: [id number] Chief Complaint  Patient presents with   Follow-up    49yr EVAR follow up    The patient returns to the office for surveillance of an abdominal aortic aneurysm status post stent graft placed about 5 to 6 years ago   Patient denies abdominal pain or back pain, no other abdominal complaints.  No symptoms consistent with distal embolization No changes in claudication distance or new rest pain symptoms. No interval development of new ulcers or wounds  There have been no significant interval changes in his overall healthcare since his last visit.   Patient denies amaurosis fugax or TIA symptoms.  The patient denies recent episodes of angina or shortness of breath.   Duplex US of the aorta and iliac arteries shows a 3.4 AAA sac with no endoleak, no leak in the sac compared to the previous study.  It was also incidentally noted that the patient had elevated velocities in the celiac artery region estimated at about 50%.  He has no postprandial symptoms.  He has no postprandial nausea or vomiting or pain.    Review of Systems  Gastrointestinal:  Negative for abdominal pain, nausea and vomiting.  All other systems reviewed and are negative.      Objective:   Physical Exam Vitals reviewed.  HENT:     Head: Normocephalic.  Cardiovascular:     Rate and Rhythm: Normal rate.  Pulmonary:     Effort: Pulmonary effort is normal.  Skin:    General: Skin is warm and dry.  Neurological:     Mental Status: He is alert and oriented to person, place, and time.  Psychiatric:        Mood and Affect: Mood normal.        Behavior: Behavior normal.        Thought Content: Thought content normal.        Judgment: Judgment normal.     BP 107/72   Pulse 86   Resp 16   Wt 211 lb (95.7 kg)   BMI 29.43 kg/m   Past Medical History:  Diagnosis Date   AAA (abdominal aortic aneurysm) (HCC)    a.) s/p  EVAR 11/05/2013.   Anxiety    a.) on BZO (alprazolam) PRN   Aortic atherosclerosis (HCC)    Atrial fibrillation (HCC)    a.) CHA2DS2-VASc = 6 (age x2, CHF, HTN, PVD/aortic plaque, T2DM). b.) rate/rhythm maintained without use of pharmacological intervention; chronically anticoagulated using dose reduced apixaban.   BPH (benign prostatic hyperplasia)    CHF (congestive heart failure) (HCC)    a.)  TTE 03/16/2021: EF 63%; moderate LVH; mild BAE mild to moderate pan valvular regurgitation; G1DD.   CKD (chronic kidney disease), stage III (HCC)    Colon polyp 07/07/2015   TUBULAR ADENOMA WITH AT LEAST HIGH-GRADE / Dr Shelle Iron   COPD (chronic obstructive pulmonary disease) (HCC)    Coronary artery disease    a.) coronary CTA 01/10/2015 CA score 722.3; mod-sev LAD disease; mild LCx disease; 50-60% RCA disease.   Current use of long term anticoagulation    a.) dose reduced apixaban   Diverticulosis    Erectile dysfunction    GERD (gastroesophageal reflux disease)    Gout    Grade I diastolic dysfunction    Heart murmur    Hepatic cyst    Hepatic steatosis    Hepatitis  Hypercholesterolemia    Hypertension    Iron deficiency anemia    Migraines    Moderate tricuspid regurgitation by prior echocardiogram    Multiple myeloma (HCC)    Nephrolithiasis    OSA on CPAP    Presence of permanent cardiac pacemaker    Prostate cancer (HCC) 01/01/13, 01/30/14   Gleason 3+4=7, volume 46.6 cc   PVD (peripheral vascular disease) (HCC)    Renal cyst    Rheumatoid arthritis (HCC)    S/P radiation therapy  04/03/2014 through 06/04/2014                                                      Prostate 7800 cGy in 40 sessions                           SSS (sick sinus syndrome) (HCC)    a.) s/p Medtronic Azure PPM placement 08/26/2016   T2DM (type 2 diabetes mellitus) (HCC)    Third degree heart block (HCC) 08/26/2016   a.) s/p PPM placement (Medtronic Azure XT DR MRI [id number] S/N: [id number]) on 08/26/2016    Vitamin D deficiency     Social History   Socioeconomic History   Marital status: Divorced    Spouse name: Not on file   Number of children: Not on file   Years of education: Not on file   Highest education level: Not on file  Occupational History   Not on file  Tobacco Use   Smoking status: Every Day    Current packs/day: 1.00    Average packs/day: 1 pack/day for 61.1 years (61.1 ttl pk-yrs)    Types: Cigarettes    Start date: 1964   Smokeless tobacco: Never   Tobacco comments:    DOWN TO 1/2 PPD  Vaping Use   Vaping status: Never Used  Substance and Sexual Activity   Alcohol use: Yes    Alcohol/week: 1.0 standard drink of alcohol    Types: 1 Cans of beer per week    Comment: occasionally   Drug use: No   Sexual activity: Not Currently  Other Topics Concern   Not on file  Social History Narrative   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   Social Drivers of Health   Financial Resource Strain: Not on file  Food Insecurity: No Food Insecurity (09/14/2022)   Hunger Vital Sign    Worried About Running Out of Food in the Last Year: Never true    Ran Out of Food in the Last Year: Never true  Transportation Needs: No Transportation Needs (09/14/2022)   PRAPARE - Administrator, Civil Service (Medical): No    Lack of Transportation (Non-Medical): No  Physical Activity: Not on file  Stress: Not on file  Social Connections: Not on file  Intimate Partner Violence: Not on file    Past Surgical History:  Procedure Laterality Date   ABDOMINAL AORTIC ANEURYSM REPAIR  11/05/2013   CATARACT EXTRACTION W/PHACO Left 06/22/2023   Procedure: CATARACT EXTRACTION PHACO AND INTRAOCULAR LENS PLACEMENT (IOC) LEFT  CLAREON VIVITY TORIC  17.82  01:15.5;  Surgeon: Lockie Mola, MD;  Location: [facility];  Service: Ophthalmology;  Laterality: Left;   COLON SURGERY  08/06/2015   Right hemicolectomy for tubulovillous adenoma with high-grade dysplasia.  COLONOSCOPY WITH  PROPOFOL N/A 07/07/2015   Procedure: COLONOSCOPY WITH PROPOFOL;  Surgeon: Elnita Maxwell, MD;  Location: [facility] ENDOSCOPY;  Service: Endoscopy;  Laterality: N/A;   COLONOSCOPY WITH PROPOFOL N/A 10/28/2016   Procedure: COLONOSCOPY WITH PROPOFOL;  Surgeon: Wyline Mood, MD;  Location: [facility] ENDOSCOPY;  Service: Endoscopy;  Laterality: N/A;   CYSTOSCOPY W/ RETROGRADES Bilateral 11/16/2021   Procedure: CYSTOSCOPY WITH RETROGRADE PYELOGRAM;  Surgeon: Vanna Scotland, MD;  Location: [facility] ORS;  Service: Urology;  Laterality: Bilateral;   CYSTOSCOPY WITH BIOPSY N/A 11/16/2021   Procedure: CYSTOSCOPY WITH BLADDER BIOPSY;  Surgeon: Vanna Scotland, MD;  Location: [facility];  Service: Urology;  Laterality: N/A;   ESOPHAGOGASTRODUODENOSCOPY (EGD) WITH PROPOFOL N/A 10/28/2016   Procedure: ESOPHAGOGASTRODUODENOSCOPY (EGD) WITH PROPOFOL;  Surgeon: Wyline Mood, MD;  Location: [facility] ENDOSCOPY;  Service: Endoscopy;  Laterality: N/A;   GIVENS CAPSULE STUDY N/A 07/25/2017   Procedure: GIVENS CAPSULE STUDY;  Surgeon: Wyline Mood, MD;  Location: [facility] ENDOSCOPY;  Service: Gastroenterology;  Laterality: N/A;   GIVENS CAPSULE STUDY N/A 01/29/2019   Procedure: GIVENS CAPSULE STUDY;  Surgeon: Wyline Mood, MD;  Location: [facility] ENDOSCOPY;  Service: Gastroenterology;  Laterality: N/A;   LAPAROSCOPIC RIGHT COLECTOMY Right 08/08/2015   Procedure: LAPAROSCOPIC RIGHT COLECTOMY;  Surgeon: Earline Mayotte, MD;  Location: [facility] ORS;  Service: General;  Laterality: Right;   NASAL SINUS SURGERY     PACEMAKER INSERTION Left 08/26/2016   Procedure: INSERTION PACEMAKER;  Surgeon: Marcina Millard, MD;  Location: [facility] OR[facility];  Service: Cardiovascular;  Laterality: Left;   PROSTATE BIOPSY  01/01/13, 01/30/14   Gleason 3+3=6, vol 46.6 cc   TONSILLECTOMY     uvula surgery     for sleep apnea    Family History  Problem Relation Age of Onset   Heart attack Mother    Cirrhosis Father    Alzheimer's disease Sister    Pancreatic cancer  Brother    Diabetes Brother    Diabetes Daughter        medication induced for cancer treatments   Cancer Daughter        breast, brain    No Known Allergies     Latest Ref Rng & Units 04/18/2023    2:04 PM 12/16/2022    2:05 PM 08/05/2022    1:22 PM  CBC  WBC 4.0 - 10.5 K/uL 8.1  8.1  8.5   Hemoglobin 13.0 - 17.0 g/dL 29.5  28.4  13.2   Hematocrit 39.0 - 52.0 % 47.4  46.3  51.9   Platelets 150 - 400 K/uL 221  189  238       CMP     Component Value Date/Time   NA 140 04/18/2023 1404   NA 142 04/13/2023 1048   NA 141 11/08/2013 0444   K 4.3 04/18/2023 1404   K 4.0 11/08/2013 0444   CL 109 04/18/2023 1404   CL 110 (H) 11/08/2013 0444   CO2 22 04/18/2023 1404   CO2 24 11/08/2013 0444   GLUCOSE 95 04/18/2023 1404   GLUCOSE 110 (H) 11/08/2013 0444   BUN 9 04/18/2023 1404   BUN 21 04/13/2023 1048   BUN 13 11/08/2013 0444   CREATININE 1.48 (H) 04/18/2023 1404   CREATININE 1.02 11/08/2013 0444   CALCIUM 8.9 04/18/2023 1404   CALCIUM 8.1 (L) 11/08/2013 0444   PROT 7.6 04/18/2023 1404   PROT 7.3 04/13/2023 1048   PROT 6.4 11/08/2013 0444   ALBUMIN 3.7 04/18/2023 1404   ALBUMIN 4.0 04/13/2023 1048  ALBUMIN 3.0 (L) 11/08/2013 0444   AST 14 (L) 04/18/2023 1404   ALT 11 04/18/2023 1404   ALT 27 11/08/2013 0444   ALKPHOS 66 04/18/2023 1404   ALKPHOS 71 11/08/2013 0444   BILITOT 1.0 04/18/2023 1404   EGFR 39 (L) 04/13/2023 1048   GFRNONAA 47 (L) 04/18/2023 1404   GFRNONAA >60 11/08/2013 0444     No results found.     Assessment & Plan:   1. Abdominal aortic aneurysm (AAA) without rupture, unspecified part (HCC) (Primary) Recommend:  Patient is status post successful endovascular repair of the AAA.   No further intervention is required at this time.   No endoleak is detected and the aneurysm sac is stable.  The patient will continue antiplatelet therapy as prescribed as well as aggressive management of hyperlipidemia. Exercise is encouraged.   However,  endografts require continued surveillance with ultrasound or CT scan. This is mandatory to detect any changes that allow repressurization of the aneurysm sac.  The patient is informed that this would be asymptomatic.  The patient is reminded that lifelong routine surveillance is a necessity with an endograft. Patient will continue to follow-up at the specified interval with ultrasound of the aorta.  2. Primary hypertension Continue antihypertensive medications as already ordered, these medications have been reviewed and there are no changes at this time.  3. Type 2 diabetes mellitus with diabetic neuropathy, unspecified whether long term insulin use (HCC) Continue hypoglycemic medications as already ordered, these medications have been reviewed and there are no changes at this time.  Hgb A1C to be monitored as already arranged by primary service   Current Outpatient Medications on File Prior to Visit  Medication Sig Dispense Refill   albuterol (PROVENTIL HFA;VENTOLIN HFA) 108 (90 Base) MCG/ACT inhaler Inhale 1 puff into the lungs every 6 (six) hours as needed.     allopurinol (ZYLOPRIM) 100 MG tablet Take 1 tablet (100 mg total) by mouth daily. 90 tablet 3   ALPRAZolam (XANAX) 0.25 MG tablet Take by mouth 2 (two) times daily as needed.     atorvastatin (LIPITOR) 80 MG tablet TAKE 1 TABLET BY MOUTH AT BEDTIME 90 tablet 0   Azelastine HCl 137 MCG/SPRAY SOLN Place 1 spray into the nose daily. 30 mL 2   benazepril (LOTENSIN) 10 MG tablet Take 1 tablet (10 mg total) by mouth daily. 90 tablet 0   BREO ELLIPTA 100-25 MCG/ACT AEPB INHALE 1 PUFF BY MOUTH ONCE DAILY AS DIRECTED 180 each 0   Cholecalciferol (VITAMIN D3) 1.25 MG (50000 UT) CAPS Take 1 capsule by mouth once a week 12 capsule 0   ELIQUIS 2.5 MG TABS tablet Take 1 tablet by mouth twice daily 180 tablet 0   empagliflozin (JARDIANCE) 25 MG TABS tablet Take 1 tablet (25 mg total) by mouth every morning. 90 tablet 0   ezetimibe (ZETIA) 10 MG  tablet Take 1 tablet by mouth once daily 90 tablet 1   fluticasone (FLONASE) 50 MCG/ACT nasal spray Place 1 spray into both nostrils daily as needed for rhinitis.     folic acid (FOLVITE) 1 MG tablet Take 1 mg by mouth daily.     gabapentin (NEURONTIN) 400 MG capsule TAKE 1 CAPSULE BY MOUTH THREE TIMES DAILY 90 capsule 0   HUMIRA PEN 40 MG/0.4ML PNKT Inject 40 mg into the skin every 14 (fourteen) days.     isosorbide mononitrate (IMDUR) 60 MG 24 hr tablet Take 1 tablet by mouth once daily 90 tablet 0  methotrexate (RHEUMATREX) 2.5 MG tablet Take 10 mg by mouth once a week.     metoprolol succinate (TOPROL-XL) 25 MG 24 hr tablet Take 1 tablet by mouth once daily 90 tablet 0   mupirocin ointment (BACTROBAN) 2 % APPLY TO RIGHT GREAT TOE ONCE DAILY UNTIL HEALED 66 g 0   pantoprazole (PROTONIX) 20 MG tablet Take 1 tablet (20 mg total) by mouth daily. 90 tablet 0   tamsulosin (FLOMAX) 0.4 MG CAPS capsule Take 1 capsule by mouth once daily 90 capsule 3   traMADol-acetaminophen (ULTRACET) 37.5-325 MG tablet TAKE 1 TABLET BY MOUTH EVERY 6 HOURS AS NEEDED FOR PAIN 30 tablet 0   triamcinolone cream (KENALOG) 0.1 % Apply 1 Application topically 2 (two) times daily. to affected area     No current facility-administered medications on file prior to visit.    There are no Patient Instructions on file for this visit. No follow-ups on file.   Georgiana Spinner, NP

## 2023-07-06 ENCOUNTER — Other Ambulatory Visit: Payer: Self-pay

## 2023-07-06 ENCOUNTER — Ambulatory Visit: Payer: Self-pay | Admitting: General Practice

## 2023-07-06 ENCOUNTER — Encounter
Admission: RE | Disposition: A | Discharge status: Home / Self Care | Payer: Self-pay | Source: Home / Self Care | Attending: Ophthalmology

## 2023-07-06 ENCOUNTER — Ambulatory Visit
Admission: RE | Admit: 2023-07-06 | Discharge: 2023-07-06 | Disposition: A | Discharge status: Home / Self Care | Payer: Medicare PPO | Attending: Ophthalmology | Admitting: Ophthalmology

## 2023-07-06 DIAGNOSIS — I130 Hypertensive heart and chronic kidney disease with heart failure and stage 1 through stage 4 chronic kidney disease, or unspecified chronic kidney disease: Secondary | ICD-10-CM | POA: Insufficient documentation

## 2023-07-06 DIAGNOSIS — E1122 Type 2 diabetes mellitus with diabetic chronic kidney disease: Secondary | ICD-10-CM | POA: Diagnosis not present

## 2023-07-06 DIAGNOSIS — H2512 Age-related nuclear cataract, left eye: Secondary | ICD-10-CM | POA: Insufficient documentation

## 2023-07-06 DIAGNOSIS — Z7984 Long term (current) use of oral hypoglycemic drugs: Secondary | ICD-10-CM | POA: Insufficient documentation

## 2023-07-06 DIAGNOSIS — F17210 Nicotine dependence, cigarettes, uncomplicated: Secondary | ICD-10-CM | POA: Diagnosis not present

## 2023-07-06 DIAGNOSIS — H2511 Age-related nuclear cataract, right eye: Secondary | ICD-10-CM | POA: Diagnosis not present

## 2023-07-06 DIAGNOSIS — I509 Heart failure, unspecified: Secondary | ICD-10-CM | POA: Insufficient documentation

## 2023-07-06 DIAGNOSIS — I4891 Unspecified atrial fibrillation: Secondary | ICD-10-CM | POA: Diagnosis not present

## 2023-07-06 DIAGNOSIS — E1136 Type 2 diabetes mellitus with diabetic cataract: Secondary | ICD-10-CM | POA: Diagnosis not present

## 2023-07-06 DIAGNOSIS — N183 Chronic kidney disease, stage 3 unspecified: Secondary | ICD-10-CM | POA: Insufficient documentation

## 2023-07-06 DIAGNOSIS — E1151 Type 2 diabetes mellitus with diabetic peripheral angiopathy without gangrene: Secondary | ICD-10-CM | POA: Diagnosis not present

## 2023-07-06 HISTORY — PX: CATARACT EXTRACTION W/PHACO: SHX586

## 2023-07-06 LAB — GLUCOSE, CAPILLARY: Glucose-Capillary: 86 mg/dL (ref 70–99)

## 2023-07-06 SURGERY — PHACOEMULSIFICATION, CATARACT, WITH IOL INSERTION
Anesthesia: Monitor Anesthesia Care | Site: Eye | Laterality: Right

## 2023-07-06 MED ORDER — TETRACAINE HCL 0.5 % OP SOLN
OPHTHALMIC | Status: AC
  Filled 2023-07-06: qty 4

## 2023-07-06 MED ORDER — SIGHTPATH DOSE#1 NA HYALUR & NA CHOND-NA HYALUR IO KIT
PACK | INTRAOCULAR | Status: DC | PRN
  Administered 2023-07-06: 1 via OPHTHALMIC

## 2023-07-06 MED ORDER — MIDAZOLAM HCL 2 MG/2ML IJ SOLN
INTRAMUSCULAR | Status: DC | PRN
  Administered 2023-07-06: 1 mg via INTRAVENOUS

## 2023-07-06 MED ORDER — BRIMONIDINE TARTRATE-TIMOLOL 0.2-0.5 % OP SOLN
OPHTHALMIC | Status: DC | PRN
  Administered 2023-07-06: 1 [drp] via OPHTHALMIC

## 2023-07-06 MED ORDER — FENTANYL CITRATE (PF) 100 MCG/2ML IJ SOLN
INTRAMUSCULAR | Status: DC | PRN
  Administered 2023-07-06: 25 ug via INTRAVENOUS

## 2023-07-06 MED ORDER — MIDAZOLAM HCL 2 MG/2ML IJ SOLN
INTRAMUSCULAR | Status: AC
  Filled 2023-07-06: qty 2

## 2023-07-06 MED ORDER — EPHEDRINE SULFATE (PRESSORS) 50 MG/ML IJ SOLN
INTRAMUSCULAR | Status: DC | PRN
  Administered 2023-07-06: 5 mg via INTRAVENOUS
  Administered 2023-07-06: 10 mg via INTRAVENOUS
  Administered 2023-07-06 (×2): 5 mg via INTRAVENOUS

## 2023-07-06 MED ORDER — PHENYLEPHRINE 80 MCG/ML (10ML) SYRINGE FOR IV PUSH (FOR BLOOD PRESSURE SUPPORT)
PREFILLED_SYRINGE | INTRAVENOUS | Status: AC
  Filled 2023-07-06: qty 10

## 2023-07-06 MED ORDER — CEFUROXIME OPHTHALMIC INJECTION 1 MG/0.1 ML
INJECTION | OPHTHALMIC | Status: DC | PRN
  Administered 2023-07-06: .1 mL via INTRACAMERAL

## 2023-07-06 MED ORDER — SIGHTPATH DOSE#1 BSS IO SOLN
INTRAOCULAR | Status: DC | PRN
  Administered 2023-07-06: 1 mL

## 2023-07-06 MED ORDER — SIGHTPATH DOSE#1 BSS IO SOLN
INTRAOCULAR | Status: DC | PRN
  Administered 2023-07-06: 96 mL via OPHTHALMIC

## 2023-07-06 MED ORDER — TETRACAINE HCL 0.5 % OP SOLN
1.0000 [drp] | OPHTHALMIC | Status: DC | PRN
  Administered 2023-07-06 (×3): 1 [drp] via OPHTHALMIC

## 2023-07-06 MED ORDER — EPHEDRINE 5 MG/ML INJ
INTRAVENOUS | Status: AC
  Filled 2023-07-06: qty 5

## 2023-07-06 MED ORDER — EPHEDRINE 5 MG/ML INJ
INTRAVENOUS | Status: AC
Start: 2023-07-06 — End: ?
  Filled 2023-07-06: qty 5

## 2023-07-06 MED ORDER — SIGHTPATH DOSE#1 BSS IO SOLN
INTRAOCULAR | Status: DC | PRN
  Administered 2023-07-06: 15 mL

## 2023-07-06 MED ORDER — ARMC OPHTHALMIC DILATING DROPS
1.0000 | OPHTHALMIC | Status: DC | PRN
Start: 2023-07-06 — End: 2023-07-06
  Administered 2023-07-06 (×3): 1 via OPHTHALMIC

## 2023-07-06 MED ORDER — PHENYLEPHRINE HCL (PRESSORS) 10 MG/ML IV SOLN
INTRAVENOUS | Status: DC | PRN
  Administered 2023-07-06: 80 ug via INTRAVENOUS

## 2023-07-06 MED ORDER — FENTANYL CITRATE (PF) 100 MCG/2ML IJ SOLN
INTRAMUSCULAR | Status: AC
  Filled 2023-07-06: qty 2

## 2023-07-06 MED ORDER — ARMC OPHTHALMIC DILATING DROPS
OPHTHALMIC | Status: AC
  Filled 2023-07-06: qty 0.5

## 2023-07-06 SURGICAL SUPPLY — 18 items
CANNULA ANT/CHMB 27G (MISCELLANEOUS) IMPLANT
CANNULA ANT/CHMB 27GA (MISCELLANEOUS)
CATARACT SUITE SIGHTPATH (MISCELLANEOUS) ×1
FEE CATARACT SUITE SIGHTPATH (MISCELLANEOUS) ×1 IMPLANT
GLOVE SRG 8 PF TXTR STRL LF DI (GLOVE) ×1 IMPLANT
GLOVE SURG ENC TEXT LTX SZ7.5 (GLOVE) ×1 IMPLANT
GLOVE SURG GAMMEX PI TX LF 7.5 (GLOVE) IMPLANT
LENS CLRN VIVITY TORIC 3 20.0 ×1 IMPLANT
LENS IOL CLRN VT TRC 3 20.0 IMPLANT
NDL FILTER BLUNT 18X1 1/2 (NEEDLE) ×1 IMPLANT
NDL RETROBULBAR .5 NSTRL (NEEDLE) IMPLANT
NEEDLE FILTER BLUNT 18X1 1/2 (NEEDLE) ×1
PACK VIT ANT 23G (MISCELLANEOUS) IMPLANT
RING MALYGIN 7.0 (MISCELLANEOUS) IMPLANT
SUT ETHILON 10-0 CS-B-6CS-B-6 (SUTURE)
SUT VICRYL 9 0 (SUTURE) IMPLANT
SUTURE EHLN 10-0 CS-B-6CS-B-6 (SUTURE) IMPLANT
SYR 3ML LL SCALE MARK (SYRINGE) ×1 IMPLANT

## 2023-07-06 NOTE — Anesthesia Preprocedure Evaluation (Signed)
Anesthesia Evaluation  Patient identified by MRN, date of birth, ID band Patient awake    Reviewed: Allergy & Precautions, H&P , NPO status , Patient's Chart, lab work & pertinent test results  Airway Mallampati: IV  TM Distance: <3 FB Neck ROM: Full    Dental no notable dental hx. (+) Poor Dentition, Missing None loose:   Pulmonary neg pulmonary ROS, shortness of breath, sleep apnea , COPD, Current Smoker   Pulmonary exam normal breath sounds clear to auscultation       Cardiovascular hypertension, + CAD, + Past MI, + Peripheral Vascular Disease and +CHF  negative cardio ROS Normal cardiovascular exam+ dysrhythmias + pacemaker + Valvular Problems/Murmurs  Rhythm:Regular Rate:Normal  08-17-16 Left ventricle: The cavity size was normal. Wall thickness was    normal. Systolic function was normal. The estimated ejection    fraction was in the range of 60% to 65%. Doppler parameters are    consistent with abnormal left ventricular relaxation (grade 1    diastolic dysfunction).  - Ventricular septum: The contour showed diastolic flattening.  - Right ventricle: The cavity size was severely dilated.  - Tricuspid valve: There was moderate regurgitation.    Impressions:    - normal overall left ventricular function    Ejection fraction greater than 60%    Septal flattening suggestive of increase pressure and/or volume    of the right side    dilated RV moderate to severe    Increase pressure on the right side suggestive pulmonary    hypertension.    Infrarenal AAA 4.7 x 4.4 cm 07-27-16 is supposed to go this month for aneurysm check. Thinks he had pacer checked "sometime last year," but isn't certain    Neuro/Psych  Headaches  Anxiety      Neuromuscular disease negative neurological ROS  negative psych ROS   GI/Hepatic negative GI ROS, Neg liver ROS,GERD  ,,(+) Hepatitis -  Endo/Other  negative endocrine ROSdiabetes     Renal/GU Renal diseasenegative Renal ROS  negative genitourinary   Musculoskeletal negative musculoskeletal ROS (+) Arthritis ,    Abdominal   Peds negative pediatric ROS (+)  Hematology negative hematology ROS (+) Blood dyscrasia, anemia   Anesthesia Other Findings Previous cataract surgery 06-22-23 Anxiety  GERD (gastroesophageal reflux disease) Hypertension Hypercholesterolemia Atrial fibrillation (HCC) Rheumatoid arthritis (HCC) S/P radiation therapy  T2DM (type 2 diabetes mellitus) (HCC) Colon polyp  Prostate cancer (HCC) Third degree heart block (HCC) Heart murmur OSA on CPAP Presence of permanent cardiac pacemaker CKD (chronic kidney disease), stage III (HCC) Gout Coronary artery disease  COPD (chronic obstructive pulmonary disease) (HCC) Multiple myeloma (HCC) Iron deficiency anemia CHF (congestive heart failure)  Hepatitis Erectile dysfunction  Nephrolithiasis PVD (peripheral vascular disease) (HCC) Diverticulosis Hepatic steatosis  Hepatic cyst Renal cyst  Current use of long term anticoagulation Aortic atherosclerosis  BPH (benign prostatic hyperplasia) AAA (abdominal aortic aneurysm)  Migraines Vitamin D deficiency  SSS (sick sinus syndrome) (HCC) Grade I diastolic dysfunction  Moderate tricuspid regurgitation by prior echocardiogram    Reproductive/Obstetrics negative OB ROS                              Anesthesia Physical Anesthesia Plan  ASA: 4  Anesthesia Plan: MAC   Post-op Pain Management:    Induction: Intravenous  PONV Risk Score and Plan:   Airway Management Planned: Natural Airway and Nasal Cannula  Additional Equipment:   Intra-op Plan:   Post-operative Plan:  Informed Consent: I have reviewed the patients History and Physical, chart, labs and discussed the procedure including the risks, benefits and alternatives for the proposed anesthesia with the patient or authorized representative who has  indicated his/her understanding and acceptance.     Dental Advisory Given  Plan Discussed with: Anesthesiologist, CRNA and Surgeon  Anesthesia Plan Comments: (Patient consented for risks of anesthesia including but not limited to:  - adverse reactions to medications - damage to eyes, teeth, lips or other oral mucosa - nerve damage due to positioning  - sore throat or hoarseness - Damage to heart, brain, nerves, lungs, other parts of body or loss of life  Patient voiced understanding and assent.)         Anesthesia Quick Evaluation

## 2023-07-06 NOTE — Transfer of Care (Signed)
Immediate Anesthesia Transfer of Care Note  Patient: Omar [name]  Procedure(s) Performed: CATARACT EXTRACTION PHACO AND INTRAOCULAR LENS PLACEMENT (IOC) RIGHT DIABETIC  CLAREON VIVITY TORIC (Right: Eye)  Patient Location: PACU  Anesthesia Type: MAC  Level of Consciousness: awake, alert  and patient cooperative  Airway and Oxygen Therapy: Patient Spontanous Breathing and Patient connected to supplemental oxygen  Post-op Assessment: Post-op Vital signs reviewed, Patient's Cardiovascular Status Stable, Respiratory Function Stable, Patent Airway and No signs of Nausea or vomiting  Post-op Vital Signs: Reviewed and stable  Complications: No notable events documented.

## 2023-07-06 NOTE — Op Note (Signed)
LOCATION:  [facility]   PREOPERATIVE DIAGNOSIS:  Nuclear sclerotic cataract of the left eye.  H25.12  POSTOPERATIVE DIAGNOSIS:  Nuclear sclerotic cataract of the left eye.   PROCEDURE:  Phacoemulsification with Toric posterior chamber intraocular lens placement of the left eye.  Ultrasound time: Procedure(s) with comments: CATARACT EXTRACTION PHACO AND INTRAOCULAR LENS PLACEMENT (IOC) RIGHT DIABETIC  CLAREON VIVITY TORIC (Right) - 17.01 1:01.9  LENS:   Implant Name Type Inv. Item Serial No. Manufacturer Lot No. LRB No. Used Action  LENS CLRN VIVITY TORIC 3 20.0 - [id number]  LENS CLRN VIVITY TORIC 3 20.0 [id number] [employer]  Right 1 Implanted     [id number] 20.0 VivityToric intraocular lens with 1.5 diopters of cylindrical power with axis orientation at 180 degrees.     SURGEON:  Deirdre Evener, MD   ANESTHESIA:  Topical with tetracaine drops and 2% Xylocaine jelly, augmented with 1% preservative-free intracameral lidocaine.  COMPLICATIONS:  None.   DESCRIPTION OF PROCEDURE:  The patient was identified in the holding room and transported to the operating suite and placed in the supine position under the operating microscope.  The left eye was identified as the operative eye, and it was prepped and draped in the usual sterile ophthalmic fashion.    A clear-corneal paracentesis incision was made at the 1:30 position.  0.5 ml of preservative-free 1% lidocaine was injected into the anterior chamber. The anterior chamber was filled with Viscoat.  A 2.4 millimeter near clear corneal incision was then made at the 10:30 position.  A cystotome and capsulorrhexis forceps were then used to make a curvilinear capsulorrhexis.  Hydrodissection and hydrodelineation were then performed using balanced salt solution.   Phacoemulsification was then used in stop and chop fashion to remove the lens, nucleus and epinucleus.  The remaining cortex was aspirated using the irrigation and  aspiration handpiece.  Provisc viscoelastic was then placed into the capsular bag to distend it for lens placement.  The Verion digital marker was used to align the implant at the intended axis.   A Toric lens was then injected into the capsular bag.  It was rotated clockwise until the axis marks on the lens were approximately 15 degrees in the counterclockwise direction to the intended alignment.  The viscoelastic was aspirated from the eye using the irrigation aspiration handpiece.  Then, a Koch spatula through the sideport incision was used to rotate the lens in a clockwise direction until the axis markings of the intraocular lens were lined up with the Verion alignment.  Balanced salt solution was then used to hydrate the wounds. Cefuroxime 0.1 ml of a 10mg /ml solution was injected into the anterior chamber for a dose of 1 mg of intracameral antibiotic at the completion of the case.    The eye was noted to have a physiologic pressure and there was no wound leak noted.   Timolol and Brimonidine drops were applied to the eye.  The patient was taken to the recovery room in stable condition having had no complications of anesthesia or surgery.  Omar [name] 07/06/2023, 11:06 AM

## 2023-07-06 NOTE — Anesthesia Postprocedure Evaluation (Signed)
Anesthesia Post Note  Patient: IMRAN NUON  Procedure(s) Performed: CATARACT EXTRACTION PHACO AND INTRAOCULAR LENS PLACEMENT (IOC) RIGHT DIABETIC  CLAREON VIVITY TORIC (Right: Eye)  Patient location during evaluation: PACU Anesthesia Type: MAC Level of consciousness: awake and alert Pain management: pain level controlled Vital Signs Assessment: post-procedure vital signs reviewed and stable Respiratory status: spontaneous breathing, nonlabored ventilation, respiratory function stable and patient connected to nasal cannula oxygen Cardiovascular status: stable and blood pressure returned to baseline Postop Assessment: no apparent nausea or vomiting Anesthetic complications: no   No notable events documented.   Last Vitals:  Vitals:   07/06/23 1108 07/06/23 1112  BP: 107/69 (!) 101/56  Pulse: 65 62  Resp: (!) 22 17  Temp: (!) 36.4 C   SpO2: 96% 96%    Last Pain:  Vitals:   07/06/23 1112  TempSrc:   PainSc: 0-No pain                 Daysy Santini C Kassidee Narciso

## 2023-07-06 NOTE — H&P (Signed)
[facility]   Primary Care Physician:  Sherron Monday, MD Ophthalmologist: Dr. Lockie Mola  Pre-Procedure History & Physical: HPI:  GLYNN YEPES is a 83 y.o. male here for ophthalmic surgery.   Past Medical History:  Diagnosis Date   AAA (abdominal aortic aneurysm) (HCC)    a.) s/p EVAR 11/05/2013.   Anxiety    a.) on BZO (alprazolam) PRN   Aortic atherosclerosis (HCC)    Atrial fibrillation (HCC)    a.) CHA2DS2-VASc = 6 (age x2, CHF, HTN, PVD/aortic plaque, T2DM). b.) rate/rhythm maintained without use of pharmacological intervention; chronically anticoagulated using dose reduced apixaban.   BPH (benign prostatic hyperplasia)    CHF (congestive heart failure) (HCC)    a.)  TTE 03/16/2021: EF 63%; moderate LVH; mild BAE mild to moderate pan valvular regurgitation; G1DD.   CKD (chronic kidney disease), stage III (HCC)    Colon polyp 07/07/2015   TUBULAR ADENOMA WITH AT LEAST HIGH-GRADE / Dr Shelle Iron   COPD (chronic obstructive pulmonary disease) (HCC)    Coronary artery disease    a.) coronary CTA 01/10/2015 CA score 722.3; mod-sev LAD disease; mild LCx disease; 50-60% RCA disease.   Current use of long term anticoagulation    a.) dose reduced apixaban   Diverticulosis    Erectile dysfunction    GERD (gastroesophageal reflux disease)    Gout    Grade I diastolic dysfunction    Heart murmur    Hepatic cyst    Hepatic steatosis    Hepatitis    Hypercholesterolemia    Hypertension    Iron deficiency anemia    Migraines    Moderate tricuspid regurgitation by prior echocardiogram    Multiple myeloma (HCC)    Nephrolithiasis    OSA on CPAP    Presence of permanent cardiac pacemaker    Prostate cancer (HCC) 01/01/13, 01/30/14   Gleason 3+4=7, volume 46.6 cc   PVD (peripheral vascular disease) (HCC)    Renal cyst    Rheumatoid arthritis (HCC)    S/P radiation therapy  04/03/2014 through 06/04/2014                                                       Prostate 7800 cGy in 40 sessions                           SSS (sick sinus syndrome) (HCC)    a.) s/p Medtronic Azure PPM placement 08/26/2016   T2DM (type 2 diabetes mellitus) (HCC)    Third degree heart block (HCC) 08/26/2016   a.) s/p PPM placement (Medtronic Azure XT DR MRI [id number] S/N: [id number]) on 08/26/2016   Vitamin D deficiency     Past Surgical History:  Procedure Laterality Date   ABDOMINAL AORTIC ANEURYSM REPAIR  11/05/2013   CATARACT EXTRACTION W/PHACO Left 06/22/2023   Procedure: CATARACT EXTRACTION PHACO AND INTRAOCULAR LENS PLACEMENT (IOC) LEFT  CLAREON VIVITY TORIC  17.82  01:15.5;  Surgeon: Lockie Mola, MD;  Location: [facility];  Service: Ophthalmology;  Laterality: Left;   COLON SURGERY  08/06/2015   Right hemicolectomy for tubulovillous adenoma with high-grade dysplasia.   COLONOSCOPY WITH PROPOFOL N/A 07/07/2015   Procedure: COLONOSCOPY WITH PROPOFOL;  Surgeon: Elnita Maxwell, MD;  Location: [facility] ENDOSCOPY;  Service: Endoscopy;  Laterality:  N/A;   COLONOSCOPY WITH PROPOFOL N/A 10/28/2016   Procedure: COLONOSCOPY WITH PROPOFOL;  Surgeon: Wyline Mood, MD;  Location: [facility] ENDOSCOPY;  Service: Endoscopy;  Laterality: N/A;   CYSTOSCOPY W/ RETROGRADES Bilateral 11/16/2021   Procedure: CYSTOSCOPY WITH RETROGRADE PYELOGRAM;  Surgeon: Vanna Scotland, MD;  Location: [facility] ORS;  Service: Urology;  Laterality: Bilateral;   CYSTOSCOPY WITH BIOPSY N/A 11/16/2021   Procedure: CYSTOSCOPY WITH BLADDER BIOPSY;  Surgeon: Vanna Scotland, MD;  Location: [facility] ORS;  Service: Urology;  Laterality: N/A;   ESOPHAGOGASTRODUODENOSCOPY (EGD) WITH PROPOFOL N/A 10/28/2016   Procedure: ESOPHAGOGASTRODUODENOSCOPY (EGD) WITH PROPOFOL;  Surgeon: Wyline Mood, MD;  Location: [facility] ENDOSCOPY;  Service: Endoscopy;  Laterality: N/A;   GIVENS CAPSULE STUDY N/A 07/25/2017   Procedure: GIVENS CAPSULE STUDY;  Surgeon: Wyline Mood, MD;  Location: [facility] ENDOSCOPY;  Service: Gastroenterology;   Laterality: N/A;   GIVENS CAPSULE STUDY N/A 01/29/2019   Procedure: GIVENS CAPSULE STUDY;  Surgeon: Wyline Mood, MD;  Location: [facility] ENDOSCOPY;  Service: Gastroenterology;  Laterality: N/A;   LAPAROSCOPIC RIGHT COLECTOMY Right 08/08/2015   Procedure: LAPAROSCOPIC RIGHT COLECTOMY;  Surgeon: Earline Mayotte, MD;  Location: [facility] ORS;  Service: General;  Laterality: Right;   NASAL SINUS SURGERY     PACEMAKER INSERTION Left 08/26/2016   Procedure: INSERTION PACEMAKER;  Surgeon: Marcina Millard, MD;  Location: [facility] ORS;  Service: Cardiovascular;  Laterality: Left;   PROSTATE BIOPSY  01/01/13, 01/30/14   Gleason 3+3=6, vol 46.6 cc   TONSILLECTOMY     uvula surgery     for sleep apnea    Prior to Admission medications   Medication Sig Start Date End Date Taking? Authorizing Provider  allopurinol (ZYLOPRIM) 100 MG tablet Take 1 tablet (100 mg total) by mouth daily. 02/02/23  Yes Sherron Monday, MD  ALPRAZolam Prudy Feeler) 0.25 MG tablet Take by mouth 2 (two) times daily as needed.   Yes [provider]  atorvastatin (LIPITOR) 80 MG tablet TAKE 1 TABLET BY MOUTH AT BEDTIME 05/27/23  Yes Tejan-Sie, S Ahmed, MD  Azelastine HCl 137 MCG/SPRAY SOLN Place 1 spray into the nose daily. 04/13/23 07/12/23 Yes Sherron Monday, MD  benazepril (LOTENSIN) 10 MG tablet Take 1 tablet (10 mg total) by mouth daily. 04/13/23  Yes Tejan-Sie, Marcelino Freestone, MD  BREO ELLIPTA 100-25 MCG/ACT AEPB INHALE 1 PUFF BY MOUTH ONCE DAILY AS DIRECTED 05/09/23  Yes Sherron Monday, MD  Cholecalciferol (VITAMIN D3) 1.25 MG (50000 UT) CAPS Take 1 capsule by mouth once a week 06/27/23  Yes Sherron Monday, MD  ELIQUIS 2.5 MG TABS tablet Take 1 tablet by mouth twice daily 05/18/23  Yes Laurier Nancy, MD  empagliflozin (JARDIANCE) 25 MG TABS tablet Take 1 tablet (25 mg total) by mouth every morning. 04/13/23  Yes Sherron Monday, MD  ezetimibe (ZETIA) 10 MG tablet Take 1 tablet by mouth once daily 04/01/23  Yes Tejan-Sie, S  Ahmed, MD  fluticasone (FLONASE) 50 MCG/ACT nasal spray Place 1 spray into both nostrils daily as needed for rhinitis.   Yes [provider]  folic acid (FOLVITE) 1 MG tablet Take 1 mg by mouth daily. 02/16/23  Yes [provider]  gabapentin (NEURONTIN) 400 MG capsule TAKE 1 CAPSULE BY MOUTH THREE TIMES DAILY 06/11/23  Yes Sherron Monday, MD  isosorbide mononitrate (IMDUR) 60 MG 24 hr tablet Take 1 tablet by mouth once daily 06/14/23  Yes Adrian Blackwater A, MD  metoprolol succinate (TOPROL-XL) 25 MG 24 hr tablet Take 1 tablet by  mouth once daily 05/18/23  Yes Adrian Blackwater A, MD  pantoprazole (PROTONIX) 20 MG tablet Take 1 tablet (20 mg total) by mouth daily. 04/13/23  Yes Sherron Monday, MD  tamsulosin (FLOMAX) 0.4 MG CAPS capsule Take 1 capsule by mouth once daily 05/18/23  Yes Vanna Scotland, MD  albuterol (PROVENTIL HFA;VENTOLIN HFA) 108 (90 Base) MCG/ACT inhaler Inhale 1 puff into the lungs every 6 (six) hours as needed. 06/17/18   [provider]  HUMIRA PEN 40 MG/0.4ML PNKT Inject 40 mg into the skin every 14 (fourteen) days. 03/27/20   [provider]  methotrexate (RHEUMATREX) 2.5 MG tablet Take 10 mg by mouth once a week.    [provider]  mupirocin ointment (BACTROBAN) 2 % APPLY TO RIGHT GREAT TOE ONCE DAILY UNTIL HEALED 02/07/23   Standiford, Jenelle Mages, DPM  traMADol-acetaminophen (ULTRACET) 37.5-325 MG tablet TAKE 1 TABLET BY MOUTH EVERY 6 HOURS AS NEEDED FOR PAIN 06/28/23   Sherron Monday, MD  triamcinolone cream (KENALOG) 0.1 % Apply 1 Application topically 2 (two) times daily. to affected area 06/30/22   [provider]    Allergies as of 05/24/2023   (No Known Allergies)    Family History  Problem Relation Age of Onset   Heart attack Mother    Cirrhosis Father    Alzheimer's disease Sister    Pancreatic cancer Brother    Diabetes Brother    Diabetes Daughter        medication induced for cancer treatments    Cancer Daughter        breast, brain    Social History   Socioeconomic History   Marital status: Divorced    Spouse name: Not on file   Number of children: Not on file   Years of education: Not on file   Highest education level: Not on file  Occupational History   Not on file  Tobacco Use   Smoking status: Every Day    Current packs/day: 1.00    Average packs/day: 1 pack/day for 61.1 years (61.1 ttl pk-yrs)    Types: Cigarettes    Start date: 1964   Smokeless tobacco: Never   Tobacco comments:    DOWN TO 1/2 PPD  Vaping Use   Vaping status: Never Used  Substance and Sexual Activity   Alcohol use: Yes    Alcohol/week: 1.0 standard drink of alcohol    Types: 1 Cans of beer per week    Comment: occasionally   Drug use: No   Sexual activity: Not Currently  Other Topics Concern   Not on file  Social History Narrative   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   Social Drivers of Health   Financial Resource Strain: Not on file  Food Insecurity: No Food Insecurity (09/14/2022)   Hunger Vital Sign    Worried About Running Out of Food in the Last Year: Never true    Ran Out of Food in the Last Year: Never true  Transportation Needs: No Transportation Needs (09/14/2022)   PRAPARE - Administrator, Civil Service (Medical): No    Lack of Transportation (Non-Medical): No  Physical Activity: Not on file  Stress: Not on file  Social Connections: Not on file  Intimate Partner Violence: Not on file    Review of Systems: See HPI, otherwise negative ROS  Physical Exam: BP 92/70   Pulse 65   Temp 98.1 F (36.7 C) (Temporal)   Resp 16   Ht 5' 10.98" (  1.803 m)   Wt 93.9 kg   SpO2 95%   BMI 28.88 kg/m  General:   Alert,  pleasant and cooperative in NAD Head:  Normocephalic and atraumatic. Lungs:  Clear to auscultation.    Heart:  Regular rate and rhythm.   Impression/Plan: SHAMAL STRACENER is here for ophthalmic surgery.  Risks, benefits, limitations, and  alternatives regarding ophthalmic surgery have been reviewed with the patient.  Questions have been answered.  All parties agreeable.   Lockie Mola, MD  07/06/2023, 10:02 AM

## 2023-07-07 ENCOUNTER — Encounter: Payer: Self-pay | Admitting: Ophthalmology

## 2023-07-12 ENCOUNTER — Other Ambulatory Visit: Payer: Self-pay | Admitting: Internal Medicine

## 2023-07-12 DIAGNOSIS — I10 Essential (primary) hypertension: Secondary | ICD-10-CM

## 2023-07-12 DIAGNOSIS — K219 Gastro-esophageal reflux disease without esophagitis: Secondary | ICD-10-CM

## 2023-07-15 ENCOUNTER — Other Ambulatory Visit: Payer: Self-pay | Admitting: Internal Medicine

## 2023-07-20 DIAGNOSIS — Z79899 Other long term (current) drug therapy: Secondary | ICD-10-CM | POA: Diagnosis not present

## 2023-07-20 DIAGNOSIS — C9001 Multiple myeloma in remission: Secondary | ICD-10-CM | POA: Diagnosis not present

## 2023-07-20 DIAGNOSIS — M0579 Rheumatoid arthritis with rheumatoid factor of multiple sites without organ or systems involvement: Secondary | ICD-10-CM | POA: Diagnosis not present

## 2023-07-27 ENCOUNTER — Encounter: Payer: Self-pay | Admitting: Internal Medicine

## 2023-07-27 ENCOUNTER — Ambulatory Visit: Payer: Medicare PPO | Admitting: Internal Medicine

## 2023-07-27 ENCOUNTER — Other Ambulatory Visit: Payer: Self-pay | Admitting: Internal Medicine

## 2023-07-27 VITALS — BP 130/78 | HR 48 | Ht 71.0 in | Wt 215.0 lb

## 2023-07-27 DIAGNOSIS — R809 Proteinuria, unspecified: Secondary | ICD-10-CM | POA: Diagnosis not present

## 2023-07-27 DIAGNOSIS — E782 Mixed hyperlipidemia: Secondary | ICD-10-CM | POA: Diagnosis not present

## 2023-07-27 DIAGNOSIS — E119 Type 2 diabetes mellitus without complications: Secondary | ICD-10-CM | POA: Diagnosis not present

## 2023-07-27 DIAGNOSIS — J301 Allergic rhinitis due to pollen: Secondary | ICD-10-CM

## 2023-07-27 DIAGNOSIS — N1832 Chronic kidney disease, stage 3b: Secondary | ICD-10-CM

## 2023-07-27 DIAGNOSIS — E1129 Type 2 diabetes mellitus with other diabetic kidney complication: Secondary | ICD-10-CM | POA: Diagnosis not present

## 2023-07-27 DIAGNOSIS — Z0130 Encounter for examination of blood pressure without abnormal findings: Secondary | ICD-10-CM

## 2023-07-27 LAB — POCT CBG (FASTING - GLUCOSE)-MANUAL ENTRY: Glucose Fasting, POC: 92 mg/dL (ref 70–99)

## 2023-07-27 LAB — POC CREATINE & ALBUMIN,URINE
Creatinine, POC: 200 mg/dL
Microalbumin Ur, POC: 30 mg/L

## 2023-07-27 MED ORDER — ATORVASTATIN CALCIUM 80 MG PO TABS: 80.0000 mg | ORAL_TABLET | Freq: Every day | ORAL | 0 refills | Status: DC

## 2023-07-27 MED ORDER — FLUTICASONE PROPIONATE 50 MCG/ACT NA SUSP: 1.0000 | Freq: Every day | NASAL | 0 refills | Status: DC | PRN

## 2023-07-27 MED ORDER — BREO ELLIPTA 100-25 MCG/ACT IN AEPB
1.0000 | INHALATION_SPRAY | Freq: Every day | RESPIRATORY_TRACT | 1 refills | Status: DC
Start: 2023-07-27 — End: 2023-09-26

## 2023-07-27 MED ORDER — AZELASTINE HCL 137 MCG/SPRAY NA SOLN
1.0000 | Freq: Every day | NASAL | 2 refills | Status: DC
Start: 2023-07-27 — End: 2023-10-25

## 2023-07-27 MED ORDER — KERENDIA 10 MG PO TABS: 10.0000 mg | ORAL_TABLET | Freq: Every day | ORAL | 0 refills | Status: DC

## 2023-07-27 MED ORDER — EMPAGLIFLOZIN 25 MG PO TABS: 25.0000 mg | ORAL_TABLET | Freq: Every morning | ORAL | 0 refills | Status: DC

## 2023-07-27 NOTE — Progress Notes (Signed)
Established Patient Office Visit  Subjective:  Patient ID: Omar [name], male    DOB: 1941/02/04  Age: 83 y.o. MRN: [id number]  Chief Complaint  Patient presents with   Follow-up    3 month follow up with lab results    No new complaints, here for lab review and medication refills. Failed to have previsit labs done.      No other concerns at this time.   Past Medical History:  Diagnosis Date   AAA (abdominal aortic aneurysm) (HCC)    a.) Glennice Marcos/p EVAR 11/05/2013.   Anxiety    a.) on BZO (alprazolam) PRN   Aortic atherosclerosis (HCC)    Atrial fibrillation (HCC)    a.) CHA2DS2-VASc = 6 (age x2, CHF, HTN, PVD/aortic plaque, T2DM). b.) rate/rhythm maintained without use of pharmacological intervention; chronically anticoagulated using dose reduced apixaban.   BPH (benign prostatic hyperplasia)    CHF (congestive heart failure) (HCC)    a.)  TTE 03/16/2021: EF 63%; moderate LVH; mild BAE mild to moderate pan valvular regurgitation; G1DD.   CKD (chronic kidney disease), stage III (HCC)    Colon polyp 07/07/2015   TUBULAR ADENOMA WITH AT LEAST HIGH-GRADE / Dr Shelle Iron   COPD (chronic obstructive pulmonary disease) (HCC)    Coronary artery disease    a.) coronary CTA 01/10/2015 CA score 722.3; mod-sev LAD disease; mild LCx disease; 50-60% RCA disease.   Current use of long term anticoagulation    a.) dose reduced apixaban   Diverticulosis    Erectile dysfunction    GERD (gastroesophageal reflux disease)    Gout    Grade I diastolic dysfunction    Heart murmur    Hepatic cyst    Hepatic steatosis    Hepatitis    Hypercholesterolemia    Hypertension    Iron deficiency anemia    Migraines    Moderate tricuspid regurgitation by prior echocardiogram    Multiple myeloma (HCC)    Nephrolithiasis    OSA on CPAP    Presence of permanent cardiac pacemaker    Prostate cancer (HCC) 01/01/13, 01/30/14   Gleason 3+4=7, volume 46.6 cc   PVD (peripheral vascular disease) (HCC)     Renal cyst    Rheumatoid arthritis (HCC)    Niko Penson/P radiation therapy  04/03/2014 through 06/04/2014                                                      Prostate 7800 cGy in 40 sessions                           SSS (sick sinus syndrome) (HCC)    a.) Eldene Plocher/p Medtronic Azure PPM placement 08/26/2016   T2DM (type 2 diabetes mellitus) (HCC)    Third degree heart block (HCC) 08/26/2016   a.) Harbor Paster/p PPM placement (Medtronic Azure XT DR MRI [id number] Whisper Kurka/N: [id number]) on 08/26/2016   Vitamin D deficiency     Past Surgical History:  Procedure Laterality Date   ABDOMINAL AORTIC ANEURYSM REPAIR  11/05/2013   CATARACT EXTRACTION W/PHACO Left 06/22/2023   Procedure: CATARACT EXTRACTION PHACO AND INTRAOCULAR LENS PLACEMENT (IOC) LEFT  CLAREON VIVITY TORIC  17.82  01:15.5;  Surgeon: Lockie Mola, MD;  Location: [facility];  Service: Ophthalmology;  Laterality: Left;   CATARACT EXTRACTION W/PHACO  Right 07/06/2023   Procedure: CATARACT EXTRACTION PHACO AND INTRAOCULAR LENS PLACEMENT (IOC) RIGHT DIABETIC  CLAREON VIVITY TORIC;  Surgeon: Lockie Mola, MD;  Location: [facility];  Service: Ophthalmology;  Laterality: Right;  17.01 1:01.9   COLON SURGERY  08/06/2015   Right hemicolectomy for tubulovillous adenoma with high-grade dysplasia.   COLONOSCOPY WITH PROPOFOL N/A 07/07/2015   Procedure: COLONOSCOPY WITH PROPOFOL;  Surgeon: Elnita Maxwell, MD;  Location: [facility] ENDOSCOPY;  Service: Endoscopy;  Laterality: N/A;   COLONOSCOPY WITH PROPOFOL N/A 10/28/2016   Procedure: COLONOSCOPY WITH PROPOFOL;  Surgeon: Wyline Mood, MD;  Location: [facility] ENDOSCOPY;  Service: Endoscopy;  Laterality: N/A;   CYSTOSCOPY W/ RETROGRADES Bilateral 11/16/2021   Procedure: CYSTOSCOPY WITH RETROGRADE PYELOGRAM;  Surgeon: Vanna Scotland, MD;  Location: [facility] ORS;  Service: Urology;  Laterality: Bilateral;   CYSTOSCOPY WITH BIOPSY N/A 11/16/2021   Procedure: CYSTOSCOPY WITH BLADDER BIOPSY;  Surgeon: Vanna Scotland, MD;  Location: [facility] ORS;  Service: Urology;  Laterality: N/A;   ESOPHAGOGASTRODUODENOSCOPY (EGD) WITH PROPOFOL N/A 10/28/2016   Procedure: ESOPHAGOGASTRODUODENOSCOPY (EGD) WITH PROPOFOL;  Surgeon: Wyline Mood, MD;  Location: [facility] ENDOSCOPY;  Service: Endoscopy;  Laterality: N/A;   GIVENS CAPSULE STUDY N/A 07/25/2017   Procedure: GIVENS CAPSULE STUDY;  Surgeon: Wyline Mood, MD;  Location: [facility] ENDOSCOPY;  Service: Gastroenterology;  Laterality: N/A;   GIVENS CAPSULE STUDY N/A 01/29/2019   Procedure: GIVENS CAPSULE STUDY;  Surgeon: Wyline Mood, MD;  Location: [facility] ENDOSCOPY;  Service: Gastroenterology;  Laterality: N/A;   LAPAROSCOPIC RIGHT COLECTOMY Right 08/08/2015   Procedure: LAPAROSCOPIC RIGHT COLECTOMY;  Surgeon: Earline Mayotte, MD;  Location: [facility] ORS;  Service: General;  Laterality: Right;   NASAL SINUS SURGERY     PACEMAKER INSERTION Left 08/26/2016   Procedure: INSERTION PACEMAKER;  Surgeon: Marcina Millard, MD;  Location: [facility] ORS;  Service: Cardiovascular;  Laterality: Left;   PROSTATE BIOPSY  01/01/13, 01/30/14   Gleason 3+3=6, vol 46.6 cc   TONSILLECTOMY     uvula surgery     for sleep apnea    Social History   Socioeconomic History   Marital status: Divorced    Spouse name: Not on file   Number of children: Not on file   Years of education: Not on file   Highest education level: Not on file  Occupational History   Not on file  Tobacco Use   Smoking status: Every Day    Current packs/day: 1.00    Average packs/day: 1 pack/day for 61.1 years (61.1 ttl pk-yrs)    Types: Cigarettes    Start date: 1964   Smokeless tobacco: Never   Tobacco comments:    DOWN TO 1/2 PPD  Vaping Use   Vaping status: Never Used  Substance and Sexual Activity   Alcohol use: Yes    Alcohol/week: 1.0 standard drink of alcohol    Types: 1 Cans of beer per week    Comment: occasionally   Drug use: No   Sexual activity: Not Currently  Other Topics Concern   Not on file   Social History Narrative   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   Social Drivers of Health   Financial Resource Strain: Not on file  Food Insecurity: No Food Insecurity (09/14/2022)   Hunger Vital Sign    Worried About Running Out of Food in the Last Year: Never true    Ran Out of Food in the Last Year: Never true  Transportation Needs: No Transportation Needs (09/14/2022)   PRAPARE - Transportation  Lack of Transportation (Medical): No    Lack of Transportation (Non-Medical): No  Physical Activity: Not on file  Stress: Not on file  Social Connections: Not on file  Intimate Partner Violence: Not on file    Family History  Problem Relation Age of Onset   Heart attack Mother    Cirrhosis Father    Alzheimer'Adriann Thau disease Sister    Pancreatic cancer Brother    Diabetes Brother    Diabetes Daughter        medication induced for cancer treatments   Cancer Daughter        breast, brain    No Known Allergies  Outpatient Medications Prior to Visit  Medication Sig   albuterol (PROVENTIL HFA;VENTOLIN HFA) 108 (90 Base) MCG/ACT inhaler Inhale 1 puff into the lungs every 6 (six) hours as needed.   allopurinol (ZYLOPRIM) 100 MG tablet Take 1 tablet (100 mg total) by mouth daily.   ALPRAZolam (XANAX) 0.25 MG tablet Take by mouth 2 (two) times daily as needed.   atorvastatin (LIPITOR) 80 MG tablet TAKE 1 TABLET BY MOUTH AT BEDTIME   Azelastine HCl 137 MCG/SPRAY SOLN Place 1 spray into the nose daily.   benazepril (LOTENSIN) 10 MG tablet Take 1 tablet by mouth once daily   BREO ELLIPTA 100-25 MCG/ACT AEPB INHALE 1 PUFF BY MOUTH ONCE DAILY AS DIRECTED   Cholecalciferol (VITAMIN D3) 1.25 MG (50000 UT) CAPS Take 1 capsule by mouth once a week   ELIQUIS 2.5 MG TABS tablet Take 1 tablet by mouth twice daily   empagliflozin (JARDIANCE) 25 MG TABS tablet Take 1 tablet (25 mg total) by mouth every morning.   ezetimibe (ZETIA) 10 MG tablet Take 1 tablet by mouth once daily   fluticasone (FLONASE) 50  MCG/ACT nasal spray Place 1 spray into both nostrils daily as needed for rhinitis.   folic acid (FOLVITE) 1 MG tablet Take 1 mg by mouth daily.   gabapentin (NEURONTIN) 400 MG capsule TAKE 1 CAPSULE BY MOUTH THREE TIMES DAILY   HUMIRA PEN 40 MG/0.4ML PNKT Inject 40 mg into the skin every 14 (fourteen) days.   isosorbide mononitrate (IMDUR) 60 MG 24 hr tablet Take 1 tablet by mouth once daily   methotrexate (RHEUMATREX) 2.5 MG tablet Take 10 mg by mouth once a week.   metoprolol succinate (TOPROL-XL) 25 MG 24 hr tablet Take 1 tablet by mouth once daily   mupirocin ointment (BACTROBAN) 2 % APPLY TO RIGHT GREAT TOE ONCE DAILY UNTIL HEALED   pantoprazole (PROTONIX) 20 MG tablet Take 1 tablet by mouth once daily   tamsulosin (FLOMAX) 0.4 MG CAPS capsule Take 1 capsule by mouth once daily   traMADol-acetaminophen (ULTRACET) 37.5-325 MG tablet TAKE 1 TABLET BY MOUTH EVERY 6 HOURS AS NEEDED FOR PAIN   triamcinolone cream (KENALOG) 0.1 % Apply 1 Application topically 2 (two) times daily. to affected area   No facility-administered medications prior to visit.    Review of Systems  Constitutional: Negative.   HENT:  Positive for congestion.        Nasal discharge  Eyes: Negative.   Cardiovascular: Negative.   Gastrointestinal: Negative.   Genitourinary: Negative.   Musculoskeletal:  Positive for back pain.  Skin: Negative.   Neurological: Negative.   Endo/Heme/Allergies:  Positive for environmental allergies.  Psychiatric/Behavioral: Negative.         Objective:   BP 130/78   Pulse (!) 48   Ht 5\' 11"  (1.803 m)   Wt 215 lb (97.5 kg)  SpO2 100%   BMI 29.99 kg/m   Vitals:   07/27/23 0854  BP: 130/78  Pulse: (!) 48  Height: 5\' 11"  (1.803 m)  Weight: 215 lb (97.5 kg)  SpO2: 100%  BMI (Calculated): 30    Physical Exam Vitals reviewed.  Constitutional:      Appearance: Normal appearance.  HENT:     Head: Normocephalic.     Left Ear: There is no impacted cerumen.     Nose:  Nose normal.     Mouth/Throat:     Mouth: Mucous membranes are moist.     Pharynx: No posterior oropharyngeal erythema.  Eyes:     Extraocular Movements: Extraocular movements intact.     Pupils: Pupils are equal, round, and reactive to light.  Cardiovascular:     Rate and Rhythm: Regular rhythm.     Chest Wall: PMI is not displaced.     Pulses: Normal pulses.     Heart sounds: Normal heart sounds. No murmur heard. Pulmonary:     Effort: Pulmonary effort is normal.     Breath sounds: Normal air entry. Rales (scanty basal) present. No rhonchi.  Abdominal:     General: Abdomen is flat. Bowel sounds are normal. There is no distension.     Palpations: Abdomen is soft. There is no hepatomegaly, splenomegaly or mass.     Tenderness: There is no abdominal tenderness.  Musculoskeletal:        General: Normal range of motion.     Cervical back: Normal range of motion and neck supple.     Right lower leg: No edema.     Left lower leg: No edema.  Skin:    General: Skin is warm and dry.  Neurological:     General: No focal deficit present.     Mental Status: He is alert and oriented to person, place, and time.     Cranial Nerves: No cranial nerve deficit.     Motor: No weakness.  Psychiatric:        Mood and Affect: Mood normal.        Behavior: Behavior normal.      Results for orders placed or performed in visit on 07/27/23  POCT CBG (Fasting - Glucose)  Result Value Ref Range   Glucose Fasting, POC 92 70 - 99 mg/dL    Recent Results (from the past 2160 hours)  IFE+PROTEIN ELECTRO, 24-HR UR     Status: Abnormal   Collection Time: 05/12/23  8:30 AM  Result Value Ref Range   Total Protein, Urine 6.2 Not Estab. mg/dL   Total Protein, Urine-Ur/day 93 30 - 150 mg/24 hr   Albumin, U 16.3 %   ALPHA 1 URINE 2.3 %   Alpha 2, Urine 6.5 %   % BETA, Urine 23.3 %   GAMMA GLOBULIN URINE 51.6 %   M-SPIKE %, Urine 33.9 (H) Not Observed %   M-Spike, Mg/24 Hr 32 (H) Not Observed mg/24 hr    Immunofixation Result, Urine Comment (A)     Comment: (NOTE) Bence Jones Protein positive; kappa type. Bence Jones Protein positive; lambda type.    Note: Comment     Comment: (NOTE) Protein electrophoresis scan will follow via computer, mail, or courier delivery. Performed At: [facility] [address] [place], [place] [id number] Jolene Schimke MD WG:[id number]    Total Volume 1,500     Comment: Performed at [facility], [address] [place], [place] [address]  Glucose, capillary  Status: Abnormal   Collection Time: 06/22/23 10:19 AM  Result Value Ref Range   Glucose-Capillary 101 (H) 70 - 99 mg/dL    Comment: Glucose reference range applies only to samples taken after fasting for at least 8 hours.  Glucose, capillary     Status: None   Collection Time: 07/06/23  9:58 AM  Result Value Ref Range   Glucose-Capillary 86 70 - 99 mg/dL    Comment: Glucose reference range applies only to samples taken after fasting for at least 8 hours.  POCT CBG (Fasting - Glucose)     Status: Normal   Collection Time: 07/27/23  9:06 AM  Result Value Ref Range   Glucose Fasting, POC 92 70 - 99 mg/dL      Assessment & Plan:   Problem List Items Addressed This Visit       Endocrine   Diabetes mellitus (HCC) - Primary   Relevant Orders   POCT CBG (Fasting - Glucose) (Completed)    No follow-ups on file.   Total time spent: 20 minutes  Luna Fuse, MD  07/27/2023   This document may have been prepared by [facility] Voice Recognition software and as such may include unintentional dictation errors.

## 2023-07-28 LAB — COMPREHENSIVE METABOLIC PANEL
ALT: 16 [IU]/L (ref 0–44)
AST: 15 [IU]/L (ref 0–40)
Albumin: 4.2 g/dL (ref 3.7–4.7)
Alkaline Phosphatase: 87 [IU]/L (ref 44–121)
BUN/Creatinine Ratio: 10 (ref 10–24)
BUN: 18 mg/dL (ref 8–27)
Bilirubin Total: 0.4 mg/dL (ref 0.0–1.2)
CO2: 20 mmol/L (ref 20–29)
Calcium: 9.4 mg/dL (ref 8.6–10.2)
Chloride: 104 mmol/L (ref 96–106)
Creatinine, Ser: 1.82 mg/dL — ABNORMAL HIGH (ref 0.76–1.27)
Globulin, Total: 3.8 g/dL (ref 1.5–4.5)
Glucose: 99 mg/dL (ref 70–99)
Potassium: 4.7 mmol/L (ref 3.5–5.2)
Sodium: 142 mmol/L (ref 134–144)
Total Protein: 8 g/dL (ref 6.0–8.5)
eGFR: 37 mL/min/{1.73_m2} — ABNORMAL LOW (ref >59)

## 2023-07-28 LAB — HEMOGLOBIN A1C
Est. average glucose Bld gHb Est-mCnc: 137 mg/dL
Hgb A1c MFr Bld: 6.4 % — ABNORMAL HIGH (ref 4.8–5.6)

## 2023-07-28 LAB — LIPID PANEL
Chol/HDL Ratio: 2.8 {ratio} (ref 0.0–5.0)
Cholesterol, Total: 144 mg/dL (ref 100–199)
HDL: 51 mg/dL (ref >39)
LDL Chol Calc (NIH): 79 mg/dL (ref 0–99)
Triglycerides: 70 mg/dL (ref 0–149)
VLDL Cholesterol Cal: 14 mg/dL (ref 5–40)

## 2023-08-10 ENCOUNTER — Other Ambulatory Visit: Payer: Self-pay | Admitting: Cardiovascular Disease

## 2023-08-13 ENCOUNTER — Other Ambulatory Visit: Payer: Self-pay | Admitting: Cardiovascular Disease

## 2023-08-15 ENCOUNTER — Other Ambulatory Visit: Payer: Self-pay | Admitting: Internal Medicine

## 2023-08-17 DIAGNOSIS — N1832 Chronic kidney disease, stage 3b: Secondary | ICD-10-CM | POA: Diagnosis not present

## 2023-08-17 DIAGNOSIS — R809 Proteinuria, unspecified: Secondary | ICD-10-CM | POA: Diagnosis not present

## 2023-08-17 DIAGNOSIS — E785 Hyperlipidemia, unspecified: Secondary | ICD-10-CM | POA: Diagnosis not present

## 2023-08-17 DIAGNOSIS — E1122 Type 2 diabetes mellitus with diabetic chronic kidney disease: Secondary | ICD-10-CM | POA: Diagnosis not present

## 2023-08-17 DIAGNOSIS — I129 Hypertensive chronic kidney disease with stage 1 through stage 4 chronic kidney disease, or unspecified chronic kidney disease: Secondary | ICD-10-CM | POA: Diagnosis not present

## 2023-08-17 DIAGNOSIS — R8290 Unspecified abnormal findings in urine: Secondary | ICD-10-CM | POA: Diagnosis not present

## 2023-08-18 ENCOUNTER — Other Ambulatory Visit: Payer: Self-pay | Admitting: Nephrology

## 2023-08-18 DIAGNOSIS — E1122 Type 2 diabetes mellitus with diabetic chronic kidney disease: Secondary | ICD-10-CM

## 2023-08-18 DIAGNOSIS — N1832 Chronic kidney disease, stage 3b: Secondary | ICD-10-CM

## 2023-08-18 DIAGNOSIS — R8290 Unspecified abnormal findings in urine: Secondary | ICD-10-CM

## 2023-08-23 ENCOUNTER — Encounter: Payer: Self-pay | Admitting: Oncology

## 2023-08-23 ENCOUNTER — Other Ambulatory Visit: Payer: Self-pay | Admitting: Internal Medicine

## 2023-08-23 DIAGNOSIS — R809 Proteinuria, unspecified: Secondary | ICD-10-CM

## 2023-08-24 ENCOUNTER — Ambulatory Visit
Admission: RE | Admit: 2023-08-24 | Discharge: 2023-08-24 | Disposition: A | Discharge status: Home / Self Care | Source: Ambulatory Visit | Attending: Nephrology | Admitting: Nephrology

## 2023-08-24 DIAGNOSIS — E1122 Type 2 diabetes mellitus with diabetic chronic kidney disease: Secondary | ICD-10-CM | POA: Diagnosis not present

## 2023-08-24 DIAGNOSIS — N1832 Chronic kidney disease, stage 3b: Secondary | ICD-10-CM | POA: Insufficient documentation

## 2023-08-24 DIAGNOSIS — R809 Proteinuria, unspecified: Secondary | ICD-10-CM | POA: Diagnosis not present

## 2023-08-24 DIAGNOSIS — R8290 Unspecified abnormal findings in urine: Secondary | ICD-10-CM | POA: Insufficient documentation

## 2023-09-07 DIAGNOSIS — N1832 Chronic kidney disease, stage 3b: Secondary | ICD-10-CM | POA: Diagnosis not present

## 2023-09-07 DIAGNOSIS — E785 Hyperlipidemia, unspecified: Secondary | ICD-10-CM | POA: Diagnosis not present

## 2023-09-07 DIAGNOSIS — E875 Hyperkalemia: Secondary | ICD-10-CM | POA: Diagnosis not present

## 2023-09-07 DIAGNOSIS — I129 Hypertensive chronic kidney disease with stage 1 through stage 4 chronic kidney disease, or unspecified chronic kidney disease: Secondary | ICD-10-CM | POA: Diagnosis not present

## 2023-09-07 DIAGNOSIS — R809 Proteinuria, unspecified: Secondary | ICD-10-CM | POA: Diagnosis not present

## 2023-09-07 DIAGNOSIS — E1122 Type 2 diabetes mellitus with diabetic chronic kidney disease: Secondary | ICD-10-CM | POA: Diagnosis not present

## 2023-09-11 ENCOUNTER — Other Ambulatory Visit: Payer: Self-pay | Admitting: Cardiovascular Disease

## 2023-09-14 ENCOUNTER — Other Ambulatory Visit: Payer: Self-pay | Admitting: Internal Medicine

## 2023-09-14 DIAGNOSIS — E1122 Type 2 diabetes mellitus with diabetic chronic kidney disease: Secondary | ICD-10-CM | POA: Diagnosis not present

## 2023-09-14 DIAGNOSIS — N281 Cyst of kidney, acquired: Secondary | ICD-10-CM | POA: Diagnosis not present

## 2023-09-14 DIAGNOSIS — N1832 Chronic kidney disease, stage 3b: Secondary | ICD-10-CM | POA: Diagnosis not present

## 2023-09-14 DIAGNOSIS — E785 Hyperlipidemia, unspecified: Secondary | ICD-10-CM | POA: Diagnosis not present

## 2023-09-14 DIAGNOSIS — I129 Hypertensive chronic kidney disease with stage 1 through stage 4 chronic kidney disease, or unspecified chronic kidney disease: Secondary | ICD-10-CM | POA: Diagnosis not present

## 2023-09-14 DIAGNOSIS — R809 Proteinuria, unspecified: Secondary | ICD-10-CM | POA: Diagnosis not present

## 2023-09-16 ENCOUNTER — Encounter: Payer: Self-pay | Admitting: Cardiovascular Disease

## 2023-09-16 ENCOUNTER — Ambulatory Visit: Admitting: Cardiovascular Disease

## 2023-09-16 VITALS — BP 99/60 | HR 67 | Ht 71.0 in | Wt 212.0 lb

## 2023-09-16 DIAGNOSIS — I739 Peripheral vascular disease, unspecified: Secondary | ICD-10-CM | POA: Diagnosis not present

## 2023-09-16 DIAGNOSIS — R0602 Shortness of breath: Secondary | ICD-10-CM | POA: Diagnosis not present

## 2023-09-16 DIAGNOSIS — E782 Mixed hyperlipidemia: Secondary | ICD-10-CM

## 2023-09-16 DIAGNOSIS — N1832 Chronic kidney disease, stage 3b: Secondary | ICD-10-CM | POA: Diagnosis not present

## 2023-09-16 DIAGNOSIS — I10 Essential (primary) hypertension: Secondary | ICD-10-CM | POA: Diagnosis not present

## 2023-09-16 DIAGNOSIS — Z0130 Encounter for examination of blood pressure without abnormal findings: Secondary | ICD-10-CM

## 2023-09-16 DIAGNOSIS — E119 Type 2 diabetes mellitus without complications: Secondary | ICD-10-CM | POA: Diagnosis not present

## 2023-09-16 DIAGNOSIS — I480 Paroxysmal atrial fibrillation: Secondary | ICD-10-CM | POA: Diagnosis not present

## 2023-09-16 DIAGNOSIS — I25118 Atherosclerotic heart disease of native coronary artery with other forms of angina pectoris: Secondary | ICD-10-CM

## 2023-09-16 NOTE — Assessment & Plan Note (Signed)
Remains in NSR 

## 2023-09-16 NOTE — Progress Notes (Signed)
 Cardiology Office Note   Date:  09/16/2023   ID:  Omar [name] 12-01-1940, MRN [id number]  PCP:  Sherron Monday, MD  Cardiologist:  Adrian Blackwater, MD      History of Present Illness: Omar [name] is a 83 y.o. male who presents for  Chief Complaint  Patient presents with   Follow-up    Follow up     Gets SOB, no c hest pains.      Past Medical History:  Diagnosis Date   AAA (abdominal aortic aneurysm) (HCC)    a.) s/p EVAR 11/05/2013.   Anxiety    a.) on BZO (alprazolam) PRN   Aortic atherosclerosis (HCC)    Atrial fibrillation (HCC)    a.) CHA2DS2-VASc = 6 (age x2, CHF, HTN, PVD/aortic plaque, T2DM). b.) rate/rhythm maintained without use of pharmacological intervention; chronically anticoagulated using dose reduced apixaban.   BPH (benign prostatic hyperplasia)    CHF (congestive heart failure) (HCC)    a.)  TTE 03/16/2021: EF 63%; moderate LVH; mild BAE mild to moderate pan valvular regurgitation; G1DD.   CKD (chronic kidney disease), stage III (HCC)    Colon polyp 07/07/2015   TUBULAR ADENOMA WITH AT LEAST HIGH-GRADE / Dr Shelle Iron   COPD (chronic obstructive pulmonary disease) (HCC)    Coronary artery disease    a.) coronary CTA 01/10/2015 CA score 722.3; mod-sev LAD disease; mild LCx disease; 50-60% RCA disease.   Current use of long term anticoagulation    a.) dose reduced apixaban   Diverticulosis    Erectile dysfunction    GERD (gastroesophageal reflux disease)    Gout    Grade I diastolic dysfunction    Heart murmur    Hepatic cyst    Hepatic steatosis    Hepatitis    Hypercholesterolemia    Hypertension    Iron deficiency anemia    Migraines    Moderate tricuspid regurgitation by prior echocardiogram    Multiple myeloma (HCC)    Nephrolithiasis    OSA on CPAP    Presence of permanent cardiac pacemaker    Prostate cancer (HCC) 01/01/13, 01/30/14   Gleason 3+4=7, volume 46.6 cc   PVD (peripheral vascular disease) (HCC)    Renal  cyst    Rheumatoid arthritis (HCC)    S/P radiation therapy  04/03/2014 through 06/04/2014                                                      Prostate 7800 cGy in 40 sessions                           SSS (sick sinus syndrome) (HCC)    a.) s/p Medtronic Azure PPM placement 08/26/2016   T2DM (type 2 diabetes mellitus) (HCC)    Third degree heart block (HCC) 08/26/2016   a.) s/p PPM placement (Medtronic Azure XT DR MRI [id number] S/N: [id number]) on 08/26/2016   Vitamin D deficiency      Past Surgical History:  Procedure Laterality Date   ABDOMINAL AORTIC ANEURYSM REPAIR  11/05/2013   CATARACT EXTRACTION W/PHACO Left 06/22/2023   Procedure: CATARACT EXTRACTION PHACO AND INTRAOCULAR LENS PLACEMENT (IOC) LEFT  CLAREON VIVITY TORIC  17.82  01:15.5;  Surgeon: Lockie Mola, MD;  Location: [facility];  Service: Ophthalmology;  Laterality: Left;   CATARACT EXTRACTION W/PHACO Right 07/06/2023   Procedure: CATARACT EXTRACTION PHACO AND INTRAOCULAR LENS PLACEMENT (IOC) RIGHT DIABETIC  CLAREON VIVITY TORIC;  Surgeon: Lockie Mola, MD;  Location: [facility];  Service: Ophthalmology;  Laterality: Right;  17.01 1:01.9   COLON SURGERY  08/06/2015   Right hemicolectomy for tubulovillous adenoma with high-grade dysplasia.   COLONOSCOPY WITH PROPOFOL N/A 07/07/2015   Procedure: COLONOSCOPY WITH PROPOFOL;  Surgeon: Elnita Maxwell, MD;  Location: [facility];  Service: Endoscopy;  Laterality: N/A;   COLONOSCOPY WITH PROPOFOL N/A 10/28/2016   Procedure: COLONOSCOPY WITH PROPOFOL;  Surgeon: Wyline Mood, MD;  Location: [facility] ENDOSCOPY;  Service: Endoscopy;  Laterality: N/A;   CYSTOSCOPY W/ RETROGRADES Bilateral 11/16/2021   Procedure: CYSTOSCOPY WITH RETROGRADE PYELOGRAM;  Surgeon: Vanna Scotland, MD;  Location: [facility];  Service: Urology;  Laterality: Bilateral;   CYSTOSCOPY WITH BIOPSY N/A 11/16/2021   Procedure: CYSTOSCOPY WITH BLADDER BIOPSY;  Surgeon: Vanna Scotland,  MD;  Location: [facility];  Service: Urology;  Laterality: N/A;   ESOPHAGOGASTRODUODENOSCOPY (EGD) WITH PROPOFOL N/A 10/28/2016   Procedure: ESOPHAGOGASTRODUODENOSCOPY (EGD) WITH PROPOFOL;  Surgeon: Wyline Mood, MD;  Location: [facility] ENDOSCOPY;  Service: Endoscopy;  Laterality: N/A;   GIVENS CAPSULE STUDY N/A 07/25/2017   Procedure: GIVENS CAPSULE STUDY;  Surgeon: Wyline Mood, MD;  Location: [facility];  Service: Gastroenterology;  Laterality: N/A;   GIVENS CAPSULE STUDY N/A 01/29/2019   Procedure: GIVENS CAPSULE STUDY;  Surgeon: Wyline Mood, MD;  Location: [facility] ENDOSCOPY;  Service: Gastroenterology;  Laterality: N/A;   LAPAROSCOPIC RIGHT COLECTOMY Right 08/08/2015   Procedure: LAPAROSCOPIC RIGHT COLECTOMY;  Surgeon: Earline Mayotte, MD;  Location: [facility];  Service: General;  Laterality: Right;   NASAL SINUS SURGERY     PACEMAKER INSERTION Left 08/26/2016   Procedure: INSERTION PACEMAKER;  Surgeon: Marcina Millard, MD;  Location: [facility];  Service: Cardiovascular;  Laterality: Left;   PROSTATE BIOPSY  01/01/13, 01/30/14   Gleason 3+3=6, vol 46.6 cc   TONSILLECTOMY     uvula surgery     for sleep apnea     Current Outpatient Medications  Medication Sig Dispense Refill   albuterol (PROVENTIL HFA;VENTOLIN HFA) 108 (90 Base) MCG/ACT inhaler Inhale 1 puff into the lungs every 6 (six) hours as needed.     allopurinol (ZYLOPRIM) 100 MG tablet Take 1 tablet (100 mg total) by mouth daily. 90 tablet 3   ALPRAZolam (XANAX) 0.25 MG tablet Take by mouth 2 (two) times daily as needed.     apixaban (ELIQUIS) 2.5 MG TABS tablet Take 1 tablet by mouth twice daily 60 tablet 0   atorvastatin (LIPITOR) 80 MG tablet Take 1 tablet (80 mg total) by mouth at bedtime. 90 tablet 0   Azelastine HCl 137 MCG/SPRAY SOLN Place 1 spray into the nose daily. 30 mL 2   BREO ELLIPTA 100-25 MCG/ACT AEPB Inhale 1 puff into the lungs daily. 90 each 1   Cholecalciferol (VITAMIN D3) 1.25 MG (50000 UT) CAPS Take 1 capsule  by mouth once a week 12 capsule 0   empagliflozin (JARDIANCE) 25 MG TABS tablet Take 1 tablet (25 mg total) by mouth every morning. 90 tablet 0   ezetimibe (ZETIA) 10 MG tablet Take 1 tablet by mouth once daily 90 tablet 1   Finerenone (KERENDIA) 10 MG TABS Take 1 tablet by mouth once daily (Patient not taking: Reported on 09/16/2023) 30 tablet 1   fluticasone (FLONASE) 50 MCG/ACT nasal spray Place 1 spray into both nostrils daily as  needed for rhinitis. 48 mL 0   folic acid (FOLVITE) 1 MG tablet Take 1 mg by mouth daily.     gabapentin (NEURONTIN) 400 MG capsule TAKE 1 CAPSULE BY MOUTH THREE TIMES DAILY 90 capsule 0   HUMIRA PEN 40 MG/0.4ML PNKT Inject 40 mg into the skin every 14 (fourteen) days.     isosorbide mononitrate (IMDUR) 60 MG 24 hr tablet Take 1 tablet by mouth once daily 90 tablet 0   methotrexate (RHEUMATREX) 2.5 MG tablet Take 10 mg by mouth once a week.     metoprolol succinate (TOPROL-XL) 25 MG 24 hr tablet Take 1 tablet by mouth once daily 90 tablet 0   mupirocin ointment (BACTROBAN) 2 % APPLY TO RIGHT GREAT TOE ONCE DAILY UNTIL HEALED 66 g 0   pantoprazole (PROTONIX) 20 MG tablet Take 1 tablet by mouth once daily 90 tablet 0   tamsulosin (FLOMAX) 0.4 MG CAPS capsule Take 1 capsule by mouth once daily 90 capsule 3   traMADol-acetaminophen (ULTRACET) 37.5-325 MG tablet TAKE 1 TABLET BY MOUTH EVERY 6 HOURS AS NEEDED FOR PAIN 30 tablet 0   triamcinolone cream (KENALOG) 0.1 % Apply 1 Application topically 2 (two) times daily. to affected area     No current facility-administered medications for this visit.    Allergies:   Patient has no known allergies.    Social History:   reports that he has been smoking cigarettes. He started smoking about 61 years ago. He has a 61.3 pack-year smoking history. He has never used smokeless tobacco. He reports current alcohol use of about 1.0 standard drink of alcohol per week. He reports that he does not use drugs.   Family History:  family  history includes Alzheimer's disease in his sister; Cancer in his daughter; Cirrhosis in his father; Diabetes in his brother and daughter; Heart attack in his mother; Pancreatic cancer in his brother.    ROS:     Review of Systems  Constitutional: Negative.   HENT: Negative.    Eyes: Negative.   Respiratory: Negative.    Gastrointestinal: Negative.   Genitourinary: Negative.   Musculoskeletal: Negative.   Skin: Negative.   Neurological: Negative.   Endo/Heme/Allergies: Negative.   Psychiatric/Behavioral: Negative.    All other systems reviewed and are negative.     All other systems are reviewed and negative.    PHYSICAL EXAM: VS:  BP 99/60   Pulse 67   Ht 5\' 11"  (1.803 m)   Wt 212 lb (96.2 kg)   SpO2 97%   BMI 29.57 kg/m  , BMI Body mass index is 29.57 kg/m. Last weight:  Wt Readings from Last 3 Encounters:  09/16/23 212 lb (96.2 kg)  07/27/23 215 lb (97.5 kg)  07/06/23 207 lb (93.9 kg)     Physical Exam Vitals reviewed.  Constitutional:      Appearance: Normal appearance. He is normal weight.  HENT:     Head: Normocephalic.     Nose: Nose normal.     Mouth/Throat:     Mouth: Mucous membranes are moist.  Eyes:     Pupils: Pupils are equal, round, and reactive to light.  Cardiovascular:     Rate and Rhythm: Normal rate and regular rhythm.     Pulses: Normal pulses.     Heart sounds: Normal heart sounds.  Pulmonary:     Effort: Pulmonary effort is normal.  Abdominal:     General: Abdomen is flat. Bowel sounds are normal.  Musculoskeletal:  General: Normal range of motion.     Cervical back: Normal range of motion.  Skin:    General: Skin is warm.  Neurological:     General: No focal deficit present.     Mental Status: He is alert.  Psychiatric:        Mood and Affect: Mood normal.       EKG:   Recent Labs: 04/18/2023: Hemoglobin 15.4; Platelet Count 221 07/27/2023: ALT 16; BUN 18; Creatinine, Ser 1.82; Potassium 4.7; Sodium 142     Lipid Panel    Component Value Date/Time   CHOL 144 07/27/2023 0940   TRIG 70 07/27/2023 0940   HDL 51 07/27/2023 0940   CHOLHDL 2.8 07/27/2023 0940   CHOLHDL 3.4 08/22/2016 0447   VLDL 13 08/22/2016 0447   LDLCALC 79 07/27/2023 0940      Other studies Reviewed: Additional studies/ records that were reviewed today include:  Review of the above records demonstrates:       No data to display            ASSESSMENT AND PLAN:    ICD-10-CM   1. SOB (shortness of breath)  R06.02 PCV ECHOCARDIOGRAM COMPLETE    MYOCARDIAL PERFUSION IMAGING   set echo, stress test    2. Mixed hyperlipidemia  E78.2 PCV ECHOCARDIOGRAM COMPLETE    MYOCARDIAL PERFUSION IMAGING    3. Type 2 diabetes mellitus without complication, without long-term current use of insulin (HCC)  E11.9 PCV ECHOCARDIOGRAM COMPLETE    MYOCARDIAL PERFUSION IMAGING    4. Primary hypertension  I10 PCV ECHOCARDIOGRAM COMPLETE    MYOCARDIAL PERFUSION IMAGING    5. PAD (peripheral artery disease) (HCC)  I73.9 PCV ECHOCARDIOGRAM COMPLETE    MYOCARDIAL PERFUSION IMAGING    6. CKD stage 3b, GFR 30-44 ml/min (HCC)  N18.32 PCV ECHOCARDIOGRAM COMPLETE    MYOCARDIAL PERFUSION IMAGING   has stopped benzapril 10 mg as crear 1.8, and low BP>    7. Paroxysmal atrial fibrillation (HCC)  I48.0 PCV ECHOCARDIOGRAM COMPLETE    MYOCARDIAL PERFUSION IMAGING   In NSR    8. Coronary artery disease of native artery of native heart with stable angina pectoris (HCC)  I25.118    had no echo, stress test last 3 years       Problem List Items Addressed This Visit       Cardiovascular and Mediastinum   Atrial fibrillation (HCC)   Remains in NSR      Relevant Orders   PCV ECHOCARDIOGRAM COMPLETE   MYOCARDIAL PERFUSION IMAGING   BP (high blood pressure)   Relevant Orders   PCV ECHOCARDIOGRAM COMPLETE   MYOCARDIAL PERFUSION IMAGING   PAD (peripheral artery disease) (HCC)   Relevant Orders   PCV ECHOCARDIOGRAM COMPLETE    MYOCARDIAL PERFUSION IMAGING     Endocrine   Diabetes mellitus (HCC)   Relevant Orders   PCV ECHOCARDIOGRAM COMPLETE   MYOCARDIAL PERFUSION IMAGING     Other   HLD (hyperlipidemia)   Relevant Orders   PCV ECHOCARDIOGRAM COMPLETE   MYOCARDIAL PERFUSION IMAGING   Other Visit Diagnoses       SOB (shortness of breath)    -  Primary   set echo, stress test   Relevant Orders   PCV ECHOCARDIOGRAM COMPLETE   MYOCARDIAL PERFUSION IMAGING     CKD stage 3b, GFR 30-44 ml/min (HCC)       has stopped benzapril 10 mg as crear 1.8, and low BP>   Relevant Orders   PCV  ECHOCARDIOGRAM COMPLETE   MYOCARDIAL PERFUSION IMAGING     Coronary artery disease of native artery of native heart with stable angina pectoris (HCC)       had no echo, stress test last 3 years          Disposition:   Return in about 4 weeks (around 10/14/2023) for echo, stress test and f/u.    Total time spent: 30 minutes  Signed,  Adrian Blackwater, MD  09/16/2023 2:43 PM    [facility]

## 2023-09-19 ENCOUNTER — Ambulatory Visit (INDEPENDENT_AMBULATORY_CARE_PROVIDER_SITE_OTHER): Payer: Medicare PPO | Admitting: Podiatry

## 2023-09-19 DIAGNOSIS — Z91198 Patient's noncompliance with other medical treatment and regimen for other reason: Secondary | ICD-10-CM

## 2023-09-19 NOTE — Progress Notes (Signed)
 1. Failure to attend appointment with reason given    Provider running behind. Patient opted to reschedule his and his sister's appointment.

## 2023-09-25 ENCOUNTER — Other Ambulatory Visit: Payer: Self-pay | Admitting: Internal Medicine

## 2023-09-28 ENCOUNTER — Ambulatory Visit (INDEPENDENT_AMBULATORY_CARE_PROVIDER_SITE_OTHER)

## 2023-09-28 DIAGNOSIS — I371 Nonrheumatic pulmonary valve insufficiency: Secondary | ICD-10-CM | POA: Diagnosis not present

## 2023-09-28 DIAGNOSIS — I361 Nonrheumatic tricuspid (valve) insufficiency: Secondary | ICD-10-CM | POA: Diagnosis not present

## 2023-09-28 DIAGNOSIS — I340 Nonrheumatic mitral (valve) insufficiency: Secondary | ICD-10-CM

## 2023-09-28 DIAGNOSIS — N1832 Chronic kidney disease, stage 3b: Secondary | ICD-10-CM

## 2023-09-28 DIAGNOSIS — I480 Paroxysmal atrial fibrillation: Secondary | ICD-10-CM

## 2023-09-28 DIAGNOSIS — E119 Type 2 diabetes mellitus without complications: Secondary | ICD-10-CM

## 2023-09-28 DIAGNOSIS — E782 Mixed hyperlipidemia: Secondary | ICD-10-CM

## 2023-09-28 DIAGNOSIS — I10 Essential (primary) hypertension: Secondary | ICD-10-CM

## 2023-09-28 DIAGNOSIS — I739 Peripheral vascular disease, unspecified: Secondary | ICD-10-CM

## 2023-09-28 DIAGNOSIS — R0602 Shortness of breath: Secondary | ICD-10-CM

## 2023-09-29 ENCOUNTER — Ambulatory Visit

## 2023-09-29 DIAGNOSIS — R0602 Shortness of breath: Secondary | ICD-10-CM | POA: Diagnosis not present

## 2023-09-29 DIAGNOSIS — E782 Mixed hyperlipidemia: Secondary | ICD-10-CM

## 2023-09-29 DIAGNOSIS — I480 Paroxysmal atrial fibrillation: Secondary | ICD-10-CM

## 2023-09-29 DIAGNOSIS — E119 Type 2 diabetes mellitus without complications: Secondary | ICD-10-CM

## 2023-09-29 DIAGNOSIS — I739 Peripheral vascular disease, unspecified: Secondary | ICD-10-CM

## 2023-09-29 DIAGNOSIS — I10 Essential (primary) hypertension: Secondary | ICD-10-CM

## 2023-09-29 DIAGNOSIS — N1832 Chronic kidney disease, stage 3b: Secondary | ICD-10-CM

## 2023-10-03 ENCOUNTER — Encounter: Payer: Self-pay | Admitting: Podiatry

## 2023-10-03 ENCOUNTER — Ambulatory Visit: Admitting: Podiatry

## 2023-10-03 VITALS — Ht 71.0 in | Wt 212.0 lb

## 2023-10-03 DIAGNOSIS — M79676 Pain in unspecified toe(s): Secondary | ICD-10-CM

## 2023-10-03 DIAGNOSIS — B351 Tinea unguium: Secondary | ICD-10-CM

## 2023-10-03 DIAGNOSIS — I739 Peripheral vascular disease, unspecified: Secondary | ICD-10-CM | POA: Diagnosis not present

## 2023-10-03 DIAGNOSIS — L84 Corns and callosities: Secondary | ICD-10-CM

## 2023-10-03 DIAGNOSIS — E1140 Type 2 diabetes mellitus with diabetic neuropathy, unspecified: Secondary | ICD-10-CM | POA: Diagnosis not present

## 2023-10-06 ENCOUNTER — Other Ambulatory Visit: Payer: Self-pay | Admitting: Internal Medicine

## 2023-10-06 DIAGNOSIS — J301 Allergic rhinitis due to pollen: Secondary | ICD-10-CM

## 2023-10-08 ENCOUNTER — Encounter: Payer: Self-pay | Admitting: Podiatry

## 2023-10-08 NOTE — Progress Notes (Unsigned)
  Subjective:  Patient ID: Omar [name], male    DOB: 1941/01/13,  MRN: [id number]  Omar [name] presents to clinic today for {jgcomplaint:23593}  Chief Complaint  Patient presents with   Nail Problem    Pt is here for [facility] unsure of last A1C PCP is Dr Gena Kemp and LOV was in February.   New problem(s): None. {jgcomplaint:23593}  PCP is Shari Daughters, MD.  No Known Allergies  Review of Systems: Negative except as noted in the HPI.  Objective: No changes noted in today's physical examination. There were no vitals filed for this visit. Omar [name] is a pleasant 83 y.o. male {jgbodyhabitus:24098} AAO x 3.  Vascular Examination: CFT <3 seconds b/l. DP/PT pulses faintly palpable b/l. Skin temperature gradient warm to warm b/l. No pain with calf compression. No ischemia or gangrene. No cyanosis or clubbing noted b/l. {jgvascular:23595}   Neurological Examination: Sensation grossly intact b/l with 10 gram monofilament. Vibratory sensation intact b/l. {jgneuro:23601::"Protective sensation intact 5/5 intact bilaterally with 10g monofilament b/l.","Vibratory sensation intact b/l.","Proprioception intact bilaterally."}  Dermatological Examination: Pedal skin warm and supple b/l.   No open wounds. No interdigital macerations.  Toenails 1-5 b/l thick, discolored, elongated with subungual debris and pain on dorsal palpation.    {jgderm:23598}  Musculoskeletal Examination: {jgmsk:23600}  Radiographs: None  Xrays {jgPodToeLocator:23637}: {jgxrayfindings:23683}  Last A1c:      Latest Ref Rng & Units 07/27/2023    9:40 AM 04/13/2023   10:48 AM 01/14/2023   10:43 AM  Hemoglobin A1C  Hemoglobin-A1c 4.8 - 5.6 % 6.4  6.1  6.4    Assessment/Plan: 1. Pain due to onychomycosis of nail   2. Corns   3. PAD (peripheral artery disease) (HCC)   4. Type 2 diabetes mellitus with diabetic neuropathy, unspecified whether long term insulin  use (HCC)     No orders of the defined  types were placed in this encounter.   None {Jgplan:23602::"-Patient/POA to call should there be question/concern in the interim."}   Return in about 3 months (around 01/02/2024).  Luella Sager, DPM      [facility] LOCATION: [address] [place] [address]                   Office [phone]   [facility] LOCATION: [address] [place], [place] [address] Office [phone]

## 2023-10-11 ENCOUNTER — Other Ambulatory Visit: Payer: Self-pay | Admitting: Internal Medicine

## 2023-10-11 DIAGNOSIS — K219 Gastro-esophageal reflux disease without esophagitis: Secondary | ICD-10-CM

## 2023-10-11 DIAGNOSIS — I10 Essential (primary) hypertension: Secondary | ICD-10-CM

## 2023-10-20 ENCOUNTER — Other Ambulatory Visit

## 2023-10-20 ENCOUNTER — Encounter: Payer: Self-pay | Admitting: Urology

## 2023-10-20 ENCOUNTER — Ambulatory Visit: Payer: Self-pay | Admitting: Urology

## 2023-10-20 ENCOUNTER — Other Ambulatory Visit: Payer: Self-pay | Admitting: Internal Medicine

## 2023-10-20 VITALS — BP 91/56 | HR 75 | Ht 71.0 in | Wt 215.0 lb

## 2023-10-20 DIAGNOSIS — R809 Proteinuria, unspecified: Secondary | ICD-10-CM | POA: Diagnosis not present

## 2023-10-20 DIAGNOSIS — R319 Hematuria, unspecified: Secondary | ICD-10-CM

## 2023-10-20 DIAGNOSIS — C61 Malignant neoplasm of prostate: Secondary | ICD-10-CM

## 2023-10-20 DIAGNOSIS — N401 Enlarged prostate with lower urinary tract symptoms: Secondary | ICD-10-CM | POA: Diagnosis not present

## 2023-10-20 DIAGNOSIS — E119 Type 2 diabetes mellitus without complications: Secondary | ICD-10-CM | POA: Diagnosis not present

## 2023-10-20 DIAGNOSIS — E782 Mixed hyperlipidemia: Secondary | ICD-10-CM | POA: Diagnosis not present

## 2023-10-20 DIAGNOSIS — N3941 Urge incontinence: Secondary | ICD-10-CM | POA: Diagnosis not present

## 2023-10-20 DIAGNOSIS — N3943 Post-void dribbling: Secondary | ICD-10-CM

## 2023-10-20 DIAGNOSIS — E1129 Type 2 diabetes mellitus with other diabetic kidney complication: Secondary | ICD-10-CM | POA: Diagnosis not present

## 2023-10-20 DIAGNOSIS — N138 Other obstructive and reflux uropathy: Secondary | ICD-10-CM

## 2023-10-20 LAB — BLADDER SCAN AMB NON-IMAGING

## 2023-10-20 MED ORDER — TAMSULOSIN HCL 0.4 MG PO CAPS: 0.4000 mg | ORAL_CAPSULE | Freq: Every day | ORAL | 3 refills | Status: AC

## 2023-10-20 NOTE — Progress Notes (Signed)
 10/20/2023 10:34 AM   [name] August 24, 1940 [id number]  Referring provider: Shari Daughters, MD [address] [place],  [place] [address]  Urological history: 1.  Prostate cancer - PSA (2024) 0.10 mL  - IMRT (2015)   2. High risk hematuria - smoker - RUS (2025) - NED - non-contrast CT (2023) -bilateral renal cysts and bilateral nonobstructing nephrolithiasis - cysto/bladder biopsies (2023) - pathology cystitis cystica with reactive atypia, but no malignancy  3. BPH with LU TS - Tamsulosin  0.4 mg daily  Chief Complaint  Patient presents with   Follow-up    HPI: Omar [name] is a 83 y.o. man who presents today for yearly follow up.   Previous records reviewed.   I PSS 11/3  Today's urinalysis is yellow clear, specific gravity 1.020, pH 6.0, 3+ glucose, 0-5 WBCs, 0-2 RBCs, greater than 10 epithelial cells, mucus present and a few bacteria.  PVR 0 mL   He is having issues with some mild urge incontinence and postvoid dribbling.  He is taking the tamsulosin  daily.  Patient denies any modifying or aggravating factors.  Patient denies any recent UTI's, gross hematuria, dysuria or suprapubic/flank pain.  Patient denies any fevers, chills, nausea or vomiting.     IPSS     Row Name 10/20/23 0900         International Prostate Symptom Score   How often have you had the sensation of not emptying your bladder? Less than half the time     How often have you had to urinate less than every two hours? About half the time     How often have you found you stopped and started again several times when you urinated? Less than half the time     How often have you found it difficult to postpone urination? Not at All     How often have you had a weak urinary stream? About half the time     How often have you had to strain to start urination? Not at All     How many times did you typically get up at night to urinate? 1 Time     Total IPSS Score 11       Quality of  Life due to urinary symptoms   If you were to spend the rest of your life with your urinary condition just the way it is now how would you feel about that? Mixed              Score:  1-7 Mild 8-19 Moderate 20-35 Severe   PMH: Past Medical History:  Diagnosis Date   AAA (abdominal aortic aneurysm) (HCC)    a.) s/p EVAR 11/05/2013.   Anxiety    a.) on BZO (alprazolam ) PRN   Aortic atherosclerosis (HCC)    Atrial fibrillation (HCC)    a.) CHA2DS2-VASc = 6 (age x2, CHF, HTN, PVD/aortic plaque, T2DM). b.) rate/rhythm maintained without use of pharmacological intervention; chronically anticoagulated using dose reduced apixaban.   BPH (benign prostatic hyperplasia)    CHF (congestive heart failure) (HCC)    a.)  TTE 03/16/2021: EF 63%; moderate LVH; mild BAE mild to moderate pan valvular regurgitation; G1DD.   CKD (chronic kidney disease), stage III (HCC)    Colon polyp 07/07/2015   TUBULAR ADENOMA WITH AT LEAST HIGH-GRADE / Dr Leatrice Provost   COPD (chronic obstructive pulmonary disease) [facility])    Coronary artery disease    a.) coronary CTA 01/10/2015 CA score 722.3; mod-sev LAD  disease; mild LCx disease; 50-60% RCA disease.   Current use of long term anticoagulation    a.) dose reduced apixaban   Diverticulosis    Erectile dysfunction    GERD (gastroesophageal reflux disease)    Gout    Grade I diastolic dysfunction    Heart murmur    Hepatic cyst    Hepatic steatosis    Hepatitis    Hypercholesterolemia    Hypertension    Iron  deficiency anemia    Migraines    Moderate tricuspid regurgitation by prior echocardiogram    Multiple myeloma (HCC)    Nephrolithiasis    OSA on CPAP    Presence of permanent cardiac pacemaker    Prostate cancer (HCC) 01/01/13, 01/30/14   Gleason 3+4=7, volume 46.6 cc   PVD (peripheral vascular disease) (HCC)    Renal cyst    Rheumatoid arthritis (HCC)    S/P radiation therapy  04/03/2014 through 06/04/2014                                                       Prostate 7800 cGy in 40 sessions                           SSS (sick sinus syndrome) (HCC)    a.) s/p Medtronic Azure PPM placement 08/26/2016   T2DM (type 2 diabetes mellitus) (HCC)    Third degree heart block (HCC) 08/26/2016   a.) s/p PPM placement (Medtronic Azure XT DR MRI [id number] S/N: [id number]) on 08/26/2016   Vitamin D deficiency     Surgical History: Past Surgical History:  Procedure Laterality Date   ABDOMINAL AORTIC ANEURYSM REPAIR  11/05/2013   CATARACT EXTRACTION W/PHACO Left 06/22/2023   Procedure: CATARACT EXTRACTION PHACO AND INTRAOCULAR LENS PLACEMENT (IOC) LEFT  CLAREON VIVITY TORIC  17.82  01:15.5;  Surgeon: Annell Kidney, MD;  Location: [facility];  Service: Ophthalmology;  Laterality: Left;   CATARACT EXTRACTION W/PHACO Right 07/06/2023   Procedure: CATARACT EXTRACTION PHACO AND INTRAOCULAR LENS PLACEMENT (IOC) RIGHT DIABETIC  CLAREON VIVITY TORIC;  Surgeon: Annell Kidney, MD;  Location: [facility];  Service: Ophthalmology;  Laterality: Right;  17.01 1:01.9   COLON SURGERY  08/06/2015   Right hemicolectomy for tubulovillous adenoma with high-grade dysplasia.   COLONOSCOPY WITH PROPOFOL  N/A 07/07/2015   Procedure: COLONOSCOPY WITH PROPOFOL ;  Surgeon: Luella Sager, MD;  Location: [facility]COPY;  Service: Endoscopy;  Laterality: N/A;   COLONOSCOPY WITH PROPOFOL  N/A 10/28/2016   Procedure: COLONOSCOPY WITH PROPOFOL ;  Surgeon: Luke Salaam, MD;  Location: [facility] ENDOSCOPY;  Service: Endoscopy;  Laterality: N/A;   CYSTOSCOPY W/ RETROGRADES Bilateral 11/16/2021   Procedure: CYSTOSCOPY WITH RETROGRADE PYELOGRAM;  Surgeon: Dustin Gimenez, MD;  Location: [facility];  Service: Urology;  Laterality: Bilateral;   CYSTOSCOPY WITH BIOPSY N/A 11/16/2021   Procedure: CYSTOSCOPY WITH BLADDER BIOPSY;  Surgeon: Dustin Gimenez, MD;  Location: [facility];  Service: Urology;  Laterality: N/A;   ESOPHAGOGASTRODUODENOSCOPY (EGD) WITH PROPOFOL  N/A  10/28/2016   Procedure: ESOPHAGOGASTRODUODENOSCOPY (EGD) WITH PROPOFOL ;  Surgeon: Luke Salaam, MD;  Location: [facility] ENDOSCOPY;  Service: Endoscopy;  Laterality: N/A;   GIVENS CAPSULE STUDY N/A 07/25/2017   Procedure: GIVENS CAPSULE STUDY;  Surgeon: Luke Salaam, MD;  Location: [facility] ENDOSCOPY;  Service: Gastroenterology;  Laterality: N/A;   GIVENS  CAPSULE STUDY N/A 01/29/2019   Procedure: GIVENS CAPSULE STUDY;  Surgeon: Luke Salaam, MD;  Location: [facility] ENDOSCOPY;  Service: Gastroenterology;  Laterality: N/A;   LAPAROSCOPIC RIGHT COLECTOMY Right 08/08/2015   Procedure: LAPAROSCOPIC RIGHT COLECTOMY;  Surgeon: Marshall Skeeter, MD;  Location: [facility];  Service: General;  Laterality: Right;   NASAL SINUS SURGERY     PACEMAKER INSERTION Left 08/26/2016   Procedure: INSERTION PACEMAKER;  Surgeon: Percival Brace, MD;  Location: [facility];  Service: Cardiovascular;  Laterality: Left;   PROSTATE BIOPSY  01/01/13, 01/30/14   Gleason 3+3=6, vol 46.6 cc   TONSILLECTOMY     uvula surgery     for sleep apnea    Home Medications:  Allergies as of 10/20/2023   No Known Allergies      Medication List        Accurate as of Oct 20, 2023 10:34 AM. If you have any questions, ask your nurse or doctor.          albuterol  108 (90 Base) MCG/ACT inhaler Commonly known as: VENTOLIN  HFA Inhale 1 puff into the lungs every 6 (six) hours as needed.   allopurinol  100 MG tablet Commonly known as: ZYLOPRIM  Take 1 tablet (100 mg total) by mouth daily.   ALPRAZolam  0.25 MG tablet Commonly known as: XANAX  Take by mouth 2 (two) times daily as needed.   atorvastatin  80 MG tablet Commonly known as: LIPITOR Take 1 tablet (80 mg total) by mouth at bedtime.   Azelastine  HCl 137 MCG/SPRAY Soln Place 1 spray into the nose daily.   Breo Ellipta  100-25 MCG/ACT Aepb Generic drug: fluticasone  furoate-vilanterol Inhale 1 puff by mouth once daily   cetirizine  10 MG tablet Commonly known as: ZYRTEC  Take 1  tablet by mouth once daily   Eliquis 2.5 MG Tabs tablet Generic drug: apixaban Take 1 tablet by mouth twice daily   empagliflozin  25 MG Tabs tablet Commonly known as: Jardiance  Take 1 tablet (25 mg total) by mouth every morning.   ezetimibe 10 MG tablet Commonly known as: ZETIA Take 1 tablet by mouth once daily   fluticasone  50 MCG/ACT nasal spray Commonly known as: FLONASE  Place 1 spray into both nostrils daily as needed for rhinitis.   folic acid  1 MG tablet Commonly known as: FOLVITE  Take 1 mg by mouth daily.   gabapentin  400 MG capsule Commonly known as: NEURONTIN  TAKE 1 CAPSULE BY MOUTH THREE TIMES DAILY   Humira (2 Pen) 40 MG/0.4ML pen Generic drug: adalimumab Inject 40 mg into the skin every 14 (fourteen) days.   isosorbide  mononitrate 60 MG 24 hr tablet Commonly known as: IMDUR  Take 1 tablet by mouth once daily   Kerendia  10 MG Tabs Generic drug: Finerenone  Take 1 tablet by mouth once daily   methotrexate  2.5 MG tablet Commonly known as: RHEUMATREX Take 10 mg by mouth once a week.   metoprolol succinate 25 MG 24 hr tablet Commonly known as: TOPROL-XL Take 1 tablet by mouth once daily   mupirocin  ointment 2 % Commonly known as: BACTROBAN  APPLY TO RIGHT GREAT TOE ONCE DAILY UNTIL HEALED   pantoprazole  20 MG tablet Commonly known as: PROTONIX  Take 1 tablet by mouth once daily   tamsulosin  0.4 MG Caps capsule Commonly known as: FLOMAX  Take 1 capsule (0.4 mg total) by mouth daily.   traMADol -acetaminophen  37.5-325 MG tablet Commonly known as: ULTRACET  TAKE 1 TABLET BY MOUTH EVERY 6 HOURS AS NEEDED FOR PAIN   triamcinolone  cream 0.1 % Commonly known as: KENALOG  Apply  1 Application topically 2 (two) times daily. to affected area   Vitamin D3 1.25 MG (50000 UT) Caps Take 1 capsule by mouth once a week        Allergies: No Known Allergies  Family History: Family History  Problem Relation Age of Onset   Heart attack Mother    Cirrhosis  Father    Alzheimer's disease Sister    Pancreatic cancer Brother    Diabetes Brother    Diabetes Daughter        medication induced for cancer treatments   Cancer Daughter        breast, brain    Social History:  reports that he has been smoking cigarettes. He started smoking about 61 years ago. He has a 61.4 pack-year smoking history. He has never used smokeless tobacco. He reports current alcohol use of about 1.0 standard drink of alcohol per week. He reports that he does not use drugs.  ROS: Pertinent ROS in HPI  Physical Exam: BP (!) 91/56   Pulse 75   Ht 5\' 11"  (1.803 m)   Wt 215 lb (97.5 kg)   BMI 29.99 kg/m   Constitutional:  Well nourished. Alert and oriented, No acute distress. HEENT: Central AT, moist mucus membranes.  Trachea midline Cardiovascular: No clubbing, cyanosis, or edema. Respiratory: Normal respiratory effort, no increased work of breathing. Neurologic: Grossly intact, no focal deficits, moving all 4 extremities. Psychiatric: Normal mood and affect.  Laboratory Data: ontains abnormal data Comprehensive metabolic panel Order: [id number] Component Ref Range & Units 1 mo ago  Glucose 65 - 99 mg/dL 89  Comment:                                                                                             Fasting reference interval                                           BUN 7 - 25 mg/dL 20  Creatinine 7.82 - 9.56 mg/dL 2.13 High   eGFR CKD-EPI CR 2021 > OR = 60 mL/min/1.6m2 39 Low   BUN/Creatinine Ratio 6 - 22 (calc) 11  Sodium 135 - 146 mmol/L 141  Potassium 3.5 - 5.3 mmol/L 4.8  Chloride 98 - 110 mmol/L 108  Bicarbonate (CO2) 20 - 32 mmol/L 24  Calcium  8.6 - 10.3 mg/dL 9.5  Total Protein 6.1 - 8.1 g/dL 7.3  Albumin  3.6 - 5.1 g/dL 3.9  Globulin, Total 1.9 - 3.7 g/dL (calc) 3.4  A/G Ratio 1.0 - 2.5 (calc) 1.1  Total Bilirubin 0.2 - 1.2 mg/dL 0.6  Alkaline Phosphatase 35 - 144 U/L 67  AST (SGOT) 10 - 35 U/L 15  ALT (SGPT) 9 - 46 U/L  14  Resulting Agency [facility]   Specimen Collected: 09/07/23 13:39   Performed by: Stacie Dys Last Resulted: 09/08/23 00:41  Received From: [facility]  Result Received: 09/16/23 13:47    CBC and Differential Order: [id number] Component Ref Range & Units 2 mo ago  WBC 3.8 -  10.8 Thousand/uL 8.8  RBC 4.20 - 5.80 Million/uL 5.41  Hemoglobin 13.2 - 17.1 g/dL 16.1  Hematocrit 09.6 - 50.0 % 50  MCV 80.0 - 100.0 fL 92.4  MCH 27.0 - 33.0 pg 29.4  MCHC 32.0 - 36.0 g/dL 04.5 Low   Comment:      For adults, a slight decrease in the calculated MCHC      value (in the range of 30 to 32 g/dL) is most likely      not clinically significant; however, it should be      interpreted with caution in correlation with other      red cell parameters and the patient's clinical      condition.  RDW 11.0 - 15.0 % 19.8 High   Platelets 140 - 400 Thousand/uL 221  MPV 7.5 - 12.5 fL 10  Neutrophils Absolute 1500 - 7800 cells/uL 6,318  Band Neutrophils Absolute, Manual Count 0 - 750 cells/uL CANCELED  Comment: Result canceled by the ancillary.  Metamyelocytes Absolute 0 cells/uL CANCELED  Comment: Result canceled by the ancillary.  Absolute Myelocytes 0 cells/uL CANCELED  Comment: Result canceled by the ancillary.  Absolute Promyelocytes 0 cells/uL CANCELED  Comment: Result canceled by the ancillary.  Lymphocytes Absolute 850 - 3900 cells/uL 1,250  Monocytes Absolute 200 - 950 cells/uL 827  Eosinophils Absolute 15 - 500 cells/uL 361  Basophils Absolute 0 - 200 cells/uL 44  Blasts Absolute 0 cells/uL CANCELED  Comment: Result canceled by the ancillary.  NRBC Absolute 0 cells/uL CANCELED  Comment: Result canceled by the ancillary.  Neutrophils Relative % 71.8  Bands Absolute % CANCELED  Comment: Result canceled by the ancillary.  Metamyelocytes Percent % CANCELED  Comment: Result canceled by the ancillary.  Myelocytes Relative % CANCELED   Comment: Result canceled by the ancillary.  Promyelocytes Relative % CANCELED  Comment: Result canceled by the ancillary.  Lymphocytes % 14.2  Variant lymphocytes/100 WBC (Bld) 0 - 10 % CANCELED  Comment: Result canceled by the ancillary.  Monocytes % 9.4  Eosinophils % 4.1  Basophils Relative % 0.5  Blasts % CANCELED  Comment: Result canceled by the ancillary.  nRBC 0 /100 WBC CANCELED  Comment: Result canceled by the ancillary.  Comment(s) CANCELED  Comment: Result canceled by the ancillary.  Resulting Agency See order comments   Specimen Collected: 08/17/23 14:58   Performed by: Stacie Dys Last Resulted: 08/19/23 21:51  Received From: [facility]  Result Received: 08/24/23 14:13   Lab Results  Component Value Date   HGBA1C 6.4 (H) 07/27/2023      Component Value Date/Time   CHOL 144 07/27/2023 0940   HDL 51 07/27/2023 0940   CHOLHDL 2.8 07/27/2023 0940   CHOLHDL 3.4 08/22/2016 0447   VLDL 13 08/22/2016 0447   LDLCALC 79 07/27/2023 0940    Lab Results  Component Value Date   AST 15 07/27/2023   Lab Results  Component Value Date   ALT 16 07/27/2023  Urinalysis See EPIC and HPI  I have reviewed the labs.   Pertinent Imaging:  10/20/23 09:58  Scan Result 0ml   Assessment & Plan:    1.Prostate cancer - PSA pending  2. High risk hematuria  - Urinalysis, Complete, negative for micro heme - no reports of gross heme - work up 2023 - no worrisome findings  3. Urge incontinence/Post void dribbling - BLADDER SCAN AMB NON-IMAGING, demonstrates adequate bladder emptying - We discussed starting an OAB medication versus Kegel exercises - He is not wanting  to start any further medications, so I gave him handout on Kegel exercises and the link to the video demonstrating how to perform Kegel exercises  4. BPH with LU TS - Continue tamsulosin  0.4 mg daily, refill sent to pharmacy   Return in about 1 year (around 10/19/2024) for PSA, UA, I PSS,  PVR .  These notes generated with voice recognition software. I apologize for typographical errors.  Briant Camper  [facility] [address]  [address] [place], [place] [address] [phone]

## 2023-10-21 ENCOUNTER — Ambulatory Visit: Payer: Self-pay | Admitting: Urology

## 2023-10-21 LAB — MICROSCOPIC EXAMINATION: Epithelial Cells (non renal): >10 /HPF — AB (ref 0–10)

## 2023-10-21 LAB — BMP8+ANION GAP
Anion Gap: 18 mmol/L (ref 10.0–18.0)
BUN/Creatinine Ratio: 11 (ref 10–24)
BUN: 20 mg/dL (ref 8–27)
CO2: 17 mmol/L — ABNORMAL LOW (ref 20–29)
Calcium: 9.6 mg/dL (ref 8.6–10.2)
Chloride: 104 mmol/L (ref 96–106)
Creatinine, Ser: 1.85 mg/dL — ABNORMAL HIGH (ref 0.76–1.27)
Glucose: 94 mg/dL (ref 70–99)
Potassium: 5.6 mmol/L — ABNORMAL HIGH (ref 3.5–5.2)
Sodium: 139 mmol/L (ref 134–144)
eGFR: 36 mL/min/{1.73_m2} — ABNORMAL LOW (ref >59)

## 2023-10-21 LAB — LIPID PANEL
Chol/HDL Ratio: 2.8 ratio (ref 0.0–5.0)
Cholesterol, Total: 119 mg/dL (ref 100–199)
HDL: 43 mg/dL (ref >39)
LDL Chol Calc (NIH): 63 mg/dL (ref 0–99)
Triglycerides: 60 mg/dL (ref 0–149)
VLDL Cholesterol Cal: 13 mg/dL (ref 5–40)

## 2023-10-21 LAB — URINALYSIS, COMPLETE
Bilirubin, UA: NEGATIVE
Ketones, UA: NEGATIVE
Leukocytes,UA: NEGATIVE
Nitrite, UA: NEGATIVE
Protein,UA: NEGATIVE
RBC, UA: NEGATIVE
Specific Gravity, UA: 1.02 (ref 1.005–1.030)
Urobilinogen, Ur: 0.2 mg/dL (ref 0.2–1.0)
pH, UA: 6 (ref 5.0–7.5)

## 2023-10-21 LAB — COMPREHENSIVE METABOLIC PANEL WITH GFR
ALT: 15 IU/L (ref 0–44)
AST: 18 IU/L (ref 0–40)
Albumin: 4 g/dL (ref 3.7–4.7)
Alkaline Phosphatase: 89 IU/L (ref 44–121)
Bilirubin Total: 0.5 mg/dL (ref 0.0–1.2)
Globulin, Total: 3.8 g/dL (ref 1.5–4.5)
Total Protein: 7.8 g/dL (ref 6.0–8.5)

## 2023-10-21 LAB — HEMOGLOBIN A1C
Est. average glucose Bld gHb Est-mCnc: 131 mg/dL
Hgb A1c MFr Bld: 6.2 % — ABNORMAL HIGH (ref 4.8–5.6)

## 2023-10-21 LAB — PSA: Prostate Specific Ag, Serum: 0.2 ng/mL (ref 0.0–4.0)

## 2023-10-24 ENCOUNTER — Encounter: Payer: Self-pay | Admitting: Cardiovascular Disease

## 2023-10-24 ENCOUNTER — Ambulatory Visit: Admitting: Cardiovascular Disease

## 2023-10-24 VITALS — BP 98/60 | HR 98 | Ht 71.0 in | Wt 214.0 lb

## 2023-10-24 DIAGNOSIS — I10 Essential (primary) hypertension: Secondary | ICD-10-CM

## 2023-10-24 DIAGNOSIS — E782 Mixed hyperlipidemia: Secondary | ICD-10-CM

## 2023-10-24 DIAGNOSIS — R0602 Shortness of breath: Secondary | ICD-10-CM

## 2023-10-24 DIAGNOSIS — I25118 Atherosclerotic heart disease of native coronary artery with other forms of angina pectoris: Secondary | ICD-10-CM

## 2023-10-24 DIAGNOSIS — I739 Peripheral vascular disease, unspecified: Secondary | ICD-10-CM

## 2023-10-24 DIAGNOSIS — I480 Paroxysmal atrial fibrillation: Secondary | ICD-10-CM

## 2023-10-24 DIAGNOSIS — N1832 Chronic kidney disease, stage 3b: Secondary | ICD-10-CM

## 2023-10-24 DIAGNOSIS — R0789 Other chest pain: Secondary | ICD-10-CM

## 2023-10-24 NOTE — Progress Notes (Signed)
 Cardiology Office Note   Date:  10/24/2023   ID:  Omar [name], Omar [name] Sep 17, 1940, MRN [id number]  PCP:  Shari Daughters, MD  Cardiologist:  Debborah Fairly, MD      History of Present Illness: Omar [name] is a 83 y.o. male who presents for  Chief Complaint  Patient presents with   Follow-up    4 week NST/echo results    Get SOB with pushing lawn mower      Past Medical History:  Diagnosis Date   AAA (abdominal aortic aneurysm) (HCC)    a.) s/p EVAR 11/05/2013.   Anxiety    a.) on BZO (alprazolam ) PRN   Aortic atherosclerosis (HCC)    Atrial fibrillation (HCC)    a.) CHA2DS2-VASc = 6 (age x2, CHF, HTN, PVD/aortic plaque, T2DM). b.) rate/rhythm maintained without use of pharmacological intervention; chronically anticoagulated using dose reduced apixaban.   BPH (benign prostatic hyperplasia)    CHF (congestive heart failure) (HCC)    a.)  TTE 03/16/2021: EF 63%; moderate LVH; mild BAE mild to moderate pan valvular regurgitation; G1DD.   CKD (chronic kidney disease), stage III (HCC)    Colon polyp 07/07/2015   TUBULAR ADENOMA WITH AT LEAST HIGH-GRADE / Dr Leatrice Provost   COPD (chronic obstructive pulmonary disease) (HCC)    Coronary artery disease    a.) coronary CTA 01/10/2015 CA score 722.3; mod-sev LAD disease; mild LCx disease; 50-60% RCA disease.   Current use of long term anticoagulation    a.) dose reduced apixaban   Diverticulosis    Erectile dysfunction    GERD (gastroesophageal reflux disease)    Gout    Grade I diastolic dysfunction    Heart murmur    Hepatic cyst    Hepatic steatosis    Hepatitis    Hypercholesterolemia    Hypertension    Iron  deficiency anemia    Migraines    Moderate tricuspid regurgitation by prior echocardiogram    Multiple myeloma (HCC)    Nephrolithiasis    OSA on CPAP    Presence of permanent cardiac pacemaker    Prostate cancer (HCC) 01/01/13, 01/30/14   Gleason 3+4=7, volume 46.6 cc   PVD (peripheral vascular disease)  (HCC)    Renal cyst    Rheumatoid arthritis (HCC)    S/P radiation therapy  04/03/2014 through 06/04/2014                                                      Prostate 7800 cGy in 40 sessions                           SSS (sick sinus syndrome) (HCC)    a.) s/p Medtronic Azure PPM placement 08/26/2016   T2DM (type 2 diabetes mellitus) (HCC)    Third degree heart block (HCC) 08/26/2016   a.) s/p PPM placement (Medtronic Azure XT DR MRI [id number] S/N: [id number]) on 08/26/2016   Vitamin D deficiency      Past Surgical History:  Procedure Laterality Date   ABDOMINAL AORTIC ANEURYSM REPAIR  11/05/2013   CATARACT EXTRACTION W/PHACO Left 06/22/2023   Procedure: CATARACT EXTRACTION PHACO AND INTRAOCULAR LENS PLACEMENT (IOC) LEFT  CLAREON VIVITY TORIC  17.82  01:15.5;  Surgeon: Annell Kidney, MD;  Location: [facility];  Service:  Ophthalmology;  Laterality: Left;   CATARACT EXTRACTION W/PHACO Right 07/06/2023   Procedure: CATARACT EXTRACTION PHACO AND INTRAOCULAR LENS PLACEMENT (IOC) RIGHT DIABETIC  CLAREON VIVITY TORIC;  Surgeon: Annell Kidney, MD;  Location: [facility];  Service: Ophthalmology;  Laterality: Right;  17.01 1:01.9   COLON SURGERY  08/06/2015   Right hemicolectomy for tubulovillous adenoma with high-grade dysplasia.   COLONOSCOPY WITH PROPOFOL  N/A 07/07/2015   Procedure: COLONOSCOPY WITH PROPOFOL ;  Surgeon: Luella Sager, MD;  Location: [facility] ENDOSCOPY;  Service: Endoscopy;  Laterality: N/A;   COLONOSCOPY WITH PROPOFOL  N/A 10/28/2016   Procedure: COLONOSCOPY WITH PROPOFOL ;  Surgeon: Luke Salaam, MD;  Location: [facility] ENDOSCOPY;  Service: Endoscopy;  Laterality: N/A;   CYSTOSCOPY W/ RETROGRADES Bilateral 11/16/2021   Procedure: CYSTOSCOPY WITH RETROGRADE PYELOGRAM;  Surgeon: Dustin Gimenez, MD;  Location: [facility];  Service: Urology;  Laterality: Bilateral;   CYSTOSCOPY WITH BIOPSY N/A 11/16/2021   Procedure: CYSTOSCOPY WITH BLADDER BIOPSY;  Surgeon:  Dustin Gimenez, MD;  Location: [facility];  Service: Urology;  Laterality: N/A;   ESOPHAGOGASTRODUODENOSCOPY (EGD) WITH PROPOFOL  N/A 10/28/2016   Procedure: ESOPHAGOGASTRODUODENOSCOPY (EGD) WITH PROPOFOL ;  Surgeon: Luke Salaam, MD;  Location: [facility] ENDOSCOPY;  Service: Endoscopy;  Laterality: N/A;   GIVENS CAPSULE STUDY N/A 07/25/2017   Procedure: GIVENS CAPSULE STUDY;  Surgeon: Luke Salaam, MD;  Location: [facility] ENDOSCOPY;  Service: Gastroenterology;  Laterality: N/A;   GIVENS CAPSULE STUDY N/A 01/29/2019   Procedure: GIVENS CAPSULE STUDY;  Surgeon: Luke Salaam, MD;  Location: [facility] ENDOSCOPY;  Service: Gastroenterology;  Laterality: N/A;   LAPAROSCOPIC RIGHT COLECTOMY Right 08/08/2015   Procedure: LAPAROSCOPIC RIGHT COLECTOMY;  Surgeon: Marshall Skeeter, MD;  Location: [facility];  Service: General;  Laterality: Right;   NASAL SINUS SURGERY     PACEMAKER INSERTION Left 08/26/2016   Procedure: INSERTION PACEMAKER;  Surgeon: Percival Brace, MD;  Location: [facility];  Service: Cardiovascular;  Laterality: Left;   PROSTATE BIOPSY  01/01/13, 01/30/14   Gleason 3+3=6, vol 46.6 cc   TONSILLECTOMY     uvula surgery     for sleep apnea     Current Outpatient Medications  Medication Sig Dispense Refill   albuterol  (PROVENTIL  HFA;VENTOLIN  HFA) 108 (90 Base) MCG/ACT inhaler Inhale 1 puff into the lungs every 6 (six) hours as needed.     allopurinol  (ZYLOPRIM ) 100 MG tablet Take 1 tablet (100 mg total) by mouth daily. 90 tablet 3   ALPRAZolam  (XANAX ) 0.25 MG tablet Take by mouth 2 (two) times daily as needed.     apixaban (ELIQUIS) 2.5 MG TABS tablet Take 1 tablet by mouth twice daily 60 tablet 0   atorvastatin  (LIPITOR) 80 MG tablet Take 1 tablet (80 mg total) by mouth at bedtime. 90 tablet 0   Azelastine  HCl 137 MCG/SPRAY SOLN Place 1 spray into the nose daily. 30 mL 2   BREO ELLIPTA  100-25 MCG/ACT AEPB Inhale 1 puff by mouth once daily 60 each 3   cetirizine  (ZYRTEC ) 10 MG tablet Take 1 tablet by  mouth once daily 30 tablet 0   Cholecalciferol (VITAMIN D3) 1.25 MG (50000 UT) CAPS Take 1 capsule by mouth once a week 12 capsule 0   empagliflozin  (JARDIANCE ) 25 MG TABS tablet Take 1 tablet (25 mg total) by mouth every morning. 90 tablet 0   ezetimibe (ZETIA) 10 MG tablet Take 1 tablet by mouth once daily 90 tablet 1   Finerenone  (KERENDIA ) 10 MG TABS Take 1 tablet by mouth once daily 30 tablet 1   fluticasone  (  FLONASE ) 50 MCG/ACT nasal spray Place 1 spray into both nostrils daily as needed for rhinitis. 48 mL 0   folic acid  (FOLVITE ) 1 MG tablet Take 1 mg by mouth daily.     gabapentin  (NEURONTIN ) 400 MG capsule TAKE 1 CAPSULE BY MOUTH THREE TIMES DAILY 90 capsule 0   HUMIRA PEN 40 MG/0.4ML PNKT Inject 40 mg into the skin every 14 (fourteen) days.     isosorbide  mononitrate (IMDUR ) 60 MG 24 hr tablet Take 1 tablet by mouth once daily 90 tablet 0   methotrexate  (RHEUMATREX) 2.5 MG tablet Take 10 mg by mouth once a week.     metoprolol succinate (TOPROL-XL) 25 MG 24 hr tablet Take 1 tablet by mouth once daily 90 tablet 0   mupirocin  ointment (BACTROBAN ) 2 % APPLY TO RIGHT GREAT TOE ONCE DAILY UNTIL HEALED 66 g 0   pantoprazole  (PROTONIX ) 20 MG tablet Take 1 tablet by mouth once daily 90 tablet 0   tamsulosin  (FLOMAX ) 0.4 MG CAPS capsule Take 1 capsule (0.4 mg total) by mouth daily. 90 capsule 3   traMADol -acetaminophen  (ULTRACET ) 37.5-325 MG tablet TAKE 1 TABLET BY MOUTH EVERY 6 HOURS AS NEEDED FOR PAIN 30 tablet 0   triamcinolone  cream (KENALOG ) 0.1 % Apply 1 Application topically 2 (two) times daily. to affected area     No current facility-administered medications for this visit.    Allergies:   Patient has no known allergies.    Social History:   reports that he has been smoking cigarettes. He started smoking about 61 years ago. He has a 61.4 pack-year smoking history. He has never used smokeless tobacco. He reports current alcohol use of about 1.0 standard drink of alcohol per week. He  reports that he does not use drugs.   Family History:  family history includes Alzheimer's disease in his sister; Cancer in his daughter; Cirrhosis in his father; Diabetes in his brother and daughter; Heart attack in his mother; Pancreatic cancer in his brother.    ROS:     Review of Systems  Constitutional: Negative.   HENT: Negative.    Eyes: Negative.   Respiratory: Negative.    Gastrointestinal: Negative.   Genitourinary: Negative.   Musculoskeletal: Negative.   Skin: Negative.   Neurological: Negative.   Endo/Heme/Allergies: Negative.   Psychiatric/Behavioral: Negative.    All other systems reviewed and are negative.     All other systems are reviewed and negative.    PHYSICAL EXAM: VS:  BP 98/60   Pulse 98   Ht 5\' 11"  (1.803 m)   Wt 214 lb (97.1 kg)   SpO2 93%   BMI 29.85 kg/m  , BMI Body mass index is 29.85 kg/m. Last weight:  Wt Readings from Last 3 Encounters:  10/24/23 214 lb (97.1 kg)  10/20/23 215 lb (97.5 kg)  10/03/23 212 lb (96.2 kg)     Physical Exam Vitals reviewed.  Constitutional:      Appearance: Normal appearance. He is normal weight.  HENT:     Head: Normocephalic.     Nose: Nose normal.     Mouth/Throat:     Mouth: Mucous membranes are moist.  Eyes:     Pupils: Pupils are equal, round, and reactive to light.  Cardiovascular:     Rate and Rhythm: Normal rate and regular rhythm.     Pulses: Normal pulses.     Heart sounds: Normal heart sounds.  Pulmonary:     Effort: Pulmonary effort is normal.  Abdominal:  General: Abdomen is flat. Bowel sounds are normal.  Musculoskeletal:        General: Normal range of motion.     Cervical back: Normal range of motion.  Skin:    General: Skin is warm.  Neurological:     General: No focal deficit present.     Mental Status: He is alert.  Psychiatric:        Mood and Affect: Mood normal.       EKG:   Recent Labs: 04/18/2023: Hemoglobin 15.4; Platelet Count 221 10/20/2023: ALT 15;  BUN 20; Creatinine, Ser 1.85; Potassium 5.6; Sodium 139    Lipid Panel    Component Value Date/Time   CHOL 119 10/20/2023 1112   TRIG 60 10/20/2023 1112   HDL 43 10/20/2023 1112   CHOLHDL 2.8 10/20/2023 1112   CHOLHDL 3.4 08/22/2016 0447   VLDL 13 08/22/2016 0447   LDLCALC 63 10/20/2023 1112      Other studies Reviewed: Additional studies/ records that were reviewed today include:  Review of the above records demonstrates:       No data to display            ASSESSMENT AND PLAN:    ICD-10-CM   1. Other chest pain  R07.89    has exertional chest [pians and SOB.    2. Mixed hyperlipidemia  E78.2     3. Primary hypertension  I10     4. PAD (peripheral artery disease) (HCC)  I73.9     5. CKD stage 3b, GFR 30-44 ml/min (HCC)  N18.32     6. SOB (shortness of breath)  R06.02    has normal stress test, but echo had diastolic dysfunction but on jaurdiance already    7. Paroxysmal atrial fibrillation (HCC)  I48.0     8. Coronary artery disease of native artery of native heart with stable angina pectoris (HCC)  I25.118        Problem List Items Addressed This Visit       Cardiovascular and Mediastinum   Atrial fibrillation (HCC)   BP (high blood pressure)   PAD (peripheral artery disease) (HCC)     Other   HLD (hyperlipidemia)   Other Visit Diagnoses       Other chest pain    -  Primary   has exertional chest [pians and SOB.     CKD stage 3b, GFR 30-44 ml/min (HCC)         SOB (shortness of breath)       has normal stress test, but echo had diastolic dysfunction but on jaurdiance already     Coronary artery disease of native artery of native heart with stable angina pectoris (HCC)              Disposition:   Return in about 3 months (around 01/24/2024).    Total time spent: 35 minutes  Signed,  Debborah Fairly, MD  10/24/2023 1:19 PM    [facility]

## 2023-10-24 NOTE — Progress Notes (Signed)
 Cardiology Office Note   Date:  10/24/2023   ID:  [name], [name] [birth year], MRN [id number]  PCP:  Shari Daughters, MD  Cardiologist:  Debborah Fairly, MD      History of Present Illness: Omar [name] is a 83 y.o. male who presents for  Chief Complaint  Patient presents with   Follow-up    4 week NST/echo results    No chest pain or SOB      Past Medical History:  Diagnosis Date   AAA (abdominal aortic aneurysm) (HCC)    a.) s/p EVAR 11/05/2013.   Anxiety    a.) on BZO (alprazolam ) PRN   Aortic atherosclerosis (HCC)    Atrial fibrillation (HCC)    a.) CHA2DS2-VASc = 6 (age x2, CHF, HTN, PVD/aortic plaque, T2DM). b.) rate/rhythm maintained without use of pharmacological intervention; chronically anticoagulated using dose reduced apixaban.   BPH (benign prostatic hyperplasia)    CHF (congestive heart failure) (HCC)    a.)  TTE 03/16/2021: EF 63%; moderate LVH; mild BAE mild to moderate pan valvular regurgitation; G1DD.   CKD (chronic kidney disease), stage III (HCC)    Colon polyp 07/07/2015   TUBULAR ADENOMA WITH AT LEAST HIGH-GRADE / Dr Leatrice Provost   COPD (chronic obstructive pulmonary disease) (HCC)    Coronary artery disease    a.) coronary CTA 01/10/2015 CA score 722.3; mod-sev LAD disease; mild LCx disease; 50-60% RCA disease.   Current use of long term anticoagulation    a.) dose reduced apixaban   Diverticulosis    Erectile dysfunction    GERD (gastroesophageal reflux disease)    Gout    Grade I diastolic dysfunction    Heart murmur    Hepatic cyst    Hepatic steatosis    Hepatitis    Hypercholesterolemia    Hypertension    Iron  deficiency anemia    Migraines    Moderate tricuspid regurgitation by prior echocardiogram    Multiple myeloma (HCC)    Nephrolithiasis    OSA on CPAP    Presence of permanent cardiac pacemaker    Prostate cancer (HCC) 01/01/13, 01/30/14   Gleason 3+4=7, volume 46.6 cc   PVD (peripheral vascular disease) (HCC)     Renal cyst    Rheumatoid arthritis (HCC)    S/P radiation therapy  04/03/2014 through 06/04/2014                                                      Prostate 7800 cGy in 40 sessions                           SSS (sick sinus syndrome) (HCC)    a.) s/p Medtronic Azure PPM placement 08/26/2016   T2DM (type 2 diabetes mellitus) (HCC)    Third degree heart block (HCC) 08/26/2016   a.) s/p PPM placement (Medtronic Azure XT DR MRI [id number] S/N: [id number]) on 08/26/2016   Vitamin D deficiency      Past Surgical History:  Procedure Laterality Date   ABDOMINAL AORTIC ANEURYSM REPAIR  11/05/2013   CATARACT EXTRACTION W/PHACO Left 06/22/2023   Procedure: CATARACT EXTRACTION PHACO AND INTRAOCULAR LENS PLACEMENT (IOC) LEFT  CLAREON VIVITY TORIC  17.82  01:15.5;  Surgeon: Annell Kidney, MD;  Location: [facility];  Service: Ophthalmology;  Laterality: Left;   CATARACT EXTRACTION W/PHACO Right 07/06/2023   Procedure: CATARACT EXTRACTION PHACO AND INTRAOCULAR LENS PLACEMENT (IOC) RIGHT DIABETIC  CLAREON VIVITY TORIC;  Surgeon: Annell Kidney, MD;  Location: [facility];  Service: Ophthalmology;  Laterality: Right;  17.01 1:01.9   COLON SURGERY  08/06/2015   Right hemicolectomy for tubulovillous adenoma with high-grade dysplasia.   COLONOSCOPY WITH PROPOFOL  N/A 07/07/2015   Procedure: COLONOSCOPY WITH PROPOFOL ;  Surgeon: Luella Sager, MD;  Location: [facility] END[facility]COPY;  Service: Endoscopy;  Laterality: N/A;   COLONOSCOPY WITH PROPOFOL  N/A 10/28/2016   Procedure: COLONOSCOPY WITH PROPOFOL ;  Surgeon: Luke Salaam, MD;  Location: [facility] ENDOSCOPY;  Service: Endoscopy;  Laterality: N/A;   CYSTOSCOPY W/ RETROGRADES Bilateral 11/16/2021   Procedure: CYSTOSCOPY WITH RETROGRADE PYELOGRAM;  Surgeon: Dustin Gimenez, MD;  Location: [facility];  Service: Urology;  Laterality: Bilateral;   CYSTOSCOPY WITH BIOPSY N/A 11/16/2021   Procedure: CYSTOSCOPY WITH BLADDER BIOPSY;  Surgeon: Dustin Gimenez, MD;  Location: [facility];  Service: Urology;  Laterality: N/A;   ESOPHAGOGASTRODUODENOSCOPY (EGD) WITH PROPOFOL  N/A 10/28/2016   Procedure: ESOPHAGOGASTRODUODENOSCOPY (EGD) WITH PROPOFOL ;  Surgeon: Luke Salaam, MD;  Location: [facility] ENDOSCOPY;  Service: Endoscopy;  Laterality: N/A;   GIVENS CAPSULE STUDY N/A 07/25/2017   Procedure: GIVENS CAPSULE STUDY;  Surgeon: Luke Salaam, MD;  Location: [facility] ENDOSCOPY;  Service: Gastroenterology;  Laterality: N/A;   GIVENS CAPSULE STUDY N/A 01/29/2019   Procedure: GIVENS CAPSULE STUDY;  Surgeon: Luke Salaam, MD;  Location: [facility] ENDOSCOPY;  Service: Gastroenterology;  Laterality: N/A;   LAPAROSCOPIC RIGHT COLECTOMY Right 08/08/2015   Procedure: LAPAROSCOPIC RIGHT COLECTOMY;  Surgeon: Marshall Skeeter, MD;  Location: [facility];  Service: General;  Laterality: Right;   NASAL SINUS SURGERY     PACEMAKER INSERTION Left 08/26/2016   Procedure: INSERTION PACEMAKER;  Surgeon: Percival Brace, MD;  Location: [facility];  Service: Cardiovascular;  Laterality: Left;   PROSTATE BIOPSY  01/01/13, 01/30/14   Gleason 3+3=6, vol 46.6 cc   TONSILLECTOMY     uvula surgery     for sleep apnea     Current Outpatient Medications  Medication Sig Dispense Refill   albuterol  (PROVENTIL  HFA;VENTOLIN  HFA) 108 (90 Base) MCG/ACT inhaler Inhale 1 puff into the lungs every 6 (six) hours as needed.     allopurinol  (ZYLOPRIM ) 100 MG tablet Take 1 tablet (100 mg total) by mouth daily. 90 tablet 3   ALPRAZolam  (XANAX ) 0.25 MG tablet Take by mouth 2 (two) times daily as needed.     apixaban (ELIQUIS) 2.5 MG TABS tablet Take 1 tablet by mouth twice daily 60 tablet 0   atorvastatin  (LIPITOR) 80 MG tablet Take 1 tablet (80 mg total) by mouth at bedtime. 90 tablet 0   Azelastine  HCl 137 MCG/SPRAY SOLN Place 1 spray into the nose daily. 30 mL 2   BREO ELLIPTA  100-25 MCG/ACT AEPB Inhale 1 puff by mouth once daily 60 each 3   cetirizine  (ZYRTEC ) 10 MG tablet Take 1 tablet by mouth once  daily 30 tablet 0   Cholecalciferol (VITAMIN D3) 1.25 MG (50000 UT) CAPS Take 1 capsule by mouth once a week 12 capsule 0   empagliflozin  (JARDIANCE ) 25 MG TABS tablet Take 1 tablet (25 mg total) by mouth every morning. 90 tablet 0   ezetimibe (ZETIA) 10 MG tablet Take 1 tablet by mouth once daily 90 tablet 1   Finerenone  (KERENDIA ) 10 MG TABS Take 1 tablet by mouth once daily 30 tablet 1   fluticasone  (FLONASE ) 50  MCG/ACT nasal spray Place 1 spray into both nostrils daily as needed for rhinitis. 48 mL 0   folic acid  (FOLVITE ) 1 MG tablet Take 1 mg by mouth daily.     gabapentin  (NEURONTIN ) 400 MG capsule TAKE 1 CAPSULE BY MOUTH THREE TIMES DAILY 90 capsule 0   HUMIRA PEN 40 MG/0.4ML PNKT Inject 40 mg into the skin every 14 (fourteen) days.     isosorbide  mononitrate (IMDUR ) 60 MG 24 hr tablet Take 1 tablet by mouth once daily 90 tablet 0   methotrexate  (RHEUMATREX) 2.5 MG tablet Take 10 mg by mouth once a week.     metoprolol succinate (TOPROL-XL) 25 MG 24 hr tablet Take 1 tablet by mouth once daily 90 tablet 0   mupirocin  ointment (BACTROBAN ) 2 % APPLY TO RIGHT GREAT TOE ONCE DAILY UNTIL HEALED 66 g 0   pantoprazole  (PROTONIX ) 20 MG tablet Take 1 tablet by mouth once daily 90 tablet 0   tamsulosin  (FLOMAX ) 0.4 MG CAPS capsule Take 1 capsule (0.4 mg total) by mouth daily. 90 capsule 3   traMADol -acetaminophen  (ULTRACET ) 37.5-325 MG tablet TAKE 1 TABLET BY MOUTH EVERY 6 HOURS AS NEEDED FOR PAIN 30 tablet 0   triamcinolone  cream (KENALOG ) 0.1 % Apply 1 Application topically 2 (two) times daily. to affected area     No current facility-administered medications for this visit.    Allergies:   Patient has no known allergies.    Social History:   reports that he has been smoking cigarettes. He started smoking about 61 years ago. He has a 61.4 pack-year smoking history. He has never used smokeless tobacco. He reports current alcohol use of about 1.0 standard drink of alcohol per week. He reports  that he does not use drugs.   Family History:  family history includes Alzheimer's disease in his sister; Cancer in his daughter; Cirrhosis in his father; Diabetes in his brother and daughter; Heart attack in his mother; Pancreatic cancer in his brother.    ROS:     ROS    All other systems are reviewed and negative.    PHYSICAL EXAM: VS:  BP 98/60   Pulse 98   Ht 5\' 11"  (1.803 m)   Wt 214 lb (97.1 kg)   SpO2 93%   BMI 29.85 kg/m  , BMI Body mass index is 29.85 kg/m. Last weight:  Wt Readings from Last 3 Encounters:  10/24/23 214 lb (97.1 kg)  10/20/23 215 lb (97.5 kg)  10/03/23 212 lb (96.2 kg)     Physical Exam    EKG:   Recent Labs: 04/18/2023: Hemoglobin 15.4; Platelet Count 221 10/20/2023: ALT 15; BUN 20; Creatinine, Ser 1.85; Potassium 5.6; Sodium 139    Lipid Panel    Component Value Date/Time   CHOL 119 10/20/2023 1112   TRIG 60 10/20/2023 1112   HDL 43 10/20/2023 1112   CHOLHDL 2.8 10/20/2023 1112   CHOLHDL 3.4 08/22/2016 0447   VLDL 13 08/22/2016 0447   LDLCALC 63 10/20/2023 1112      Other studies Reviewed: Additional studies/ records that were reviewed today include:  Review of the above records demonstrates:       No data to display            ASSESSMENT AND PLAN:    ICD-10-CM   1. Other chest pain  R07.89    feeling better    2. Mixed hyperlipidemia  E78.2     3. Primary hypertension  I10  4. PAD (peripheral artery disease) (HCC)  I73.9     5. CKD stage 3b, GFR 30-44 ml/min (HCC)  N18.32     6. SOB (shortness of breath)  R06.02    has normal stress test, but echo had diastolic dysfunction but on jaurdiance already    7. Paroxysmal atrial fibrillation (HCC)  I48.0     8. Coronary artery disease of native artery of native heart with stable angina pectoris (HCC)  I25.118        Problem List Items Addressed This Visit       Cardiovascular and Mediastinum   Atrial fibrillation (HCC)   BP (high blood pressure)    PAD (peripheral artery disease) (HCC)     Other   HLD (hyperlipidemia)   Other Visit Diagnoses       Other chest pain    -  Primary   feeling better     CKD stage 3b, GFR 30-44 ml/min (HCC)         SOB (shortness of breath)       has normal stress test, but echo had diastolic dysfunction but on jaurdiance already     Coronary artery disease of native artery of native heart with stable angina pectoris (HCC)              Disposition:   Return in about 3 months (around 01/24/2024).    Total time spent: 30 minutes  Signed,  Debborah Fairly, MD  10/24/2023 1:29 PM    [facility]

## 2023-10-25 ENCOUNTER — Ambulatory Visit: Payer: Self-pay | Admitting: Internal Medicine

## 2023-10-25 ENCOUNTER — Ambulatory Visit (INDEPENDENT_AMBULATORY_CARE_PROVIDER_SITE_OTHER): Payer: Medicare PPO | Admitting: Internal Medicine

## 2023-10-25 ENCOUNTER — Encounter (INDEPENDENT_AMBULATORY_CARE_PROVIDER_SITE_OTHER): Payer: Self-pay

## 2023-10-25 VITALS — BP 109/79 | HR 78 | Temp 96.8°F | Ht 71.0 in | Wt 213.8 lb

## 2023-10-25 DIAGNOSIS — Z0130 Encounter for examination of blood pressure without abnormal findings: Secondary | ICD-10-CM

## 2023-10-25 DIAGNOSIS — E1129 Type 2 diabetes mellitus with other diabetic kidney complication: Secondary | ICD-10-CM | POA: Diagnosis not present

## 2023-10-25 DIAGNOSIS — R809 Proteinuria, unspecified: Secondary | ICD-10-CM

## 2023-10-25 DIAGNOSIS — J301 Allergic rhinitis due to pollen: Secondary | ICD-10-CM | POA: Diagnosis not present

## 2023-10-25 DIAGNOSIS — E119 Type 2 diabetes mellitus without complications: Secondary | ICD-10-CM

## 2023-10-25 DIAGNOSIS — E782 Mixed hyperlipidemia: Secondary | ICD-10-CM

## 2023-10-25 DIAGNOSIS — E875 Hyperkalemia: Secondary | ICD-10-CM

## 2023-10-25 LAB — POCT CBG (FASTING - GLUCOSE)-MANUAL ENTRY: Glucose Fasting, POC: 109 mg/dL — AB (ref 70–99)

## 2023-10-25 MED ORDER — GABAPENTIN 400 MG PO CAPS: 400.0000 mg | ORAL_CAPSULE | Freq: Three times a day (TID) | ORAL | 0 refills | Status: DC

## 2023-10-25 MED ORDER — KERENDIA 10 MG PO TABS: 1.0000 | ORAL_TABLET | Freq: Every day | ORAL | 1 refills | Status: DC

## 2023-10-25 MED ORDER — FLUTICASONE PROPIONATE 50 MCG/ACT NA SUSP: 1.0000 | Freq: Every day | NASAL | 0 refills | Status: DC | PRN

## 2023-10-25 MED ORDER — AZELASTINE HCL 137 MCG/SPRAY NA SOLN: 1.0000 | Freq: Every day | NASAL | 2 refills | Status: DC

## 2023-10-25 MED ORDER — LOKELMA 10 G PO PACK: 10.0000 g | PACK | Freq: Three times a day (TID) | ORAL | Status: AC

## 2023-10-25 MED ORDER — ATORVASTATIN CALCIUM 80 MG PO TABS
80.0000 mg | ORAL_TABLET | Freq: Every day | ORAL | 0 refills | Status: DC
Start: 2023-10-25 — End: 2024-01-27

## 2023-10-25 NOTE — Progress Notes (Signed)
 Established Patient Office Visit  Subjective:  Patient ID: Omar [name], male    DOB: 29-Sep-1940  Age: 83 y.o. MRN: [id number]  Chief Complaint  Patient presents with   Follow-up    3 Months Follow Up    C/o postural dizziness and low bp readings at his recent outpatient appointments. Also here for lab review and medication refills. Labs reviewed and notable for well controlled diabetes, A1c improved and remains at target, lipids at target with cmp notable for hyperkalemia and stable CKD 3b.    No other concerns at this time.   Past Medical History:  Diagnosis Date   AAA (abdominal aortic aneurysm) (HCC)    a.) Norell Brisbin/p EVAR 11/05/2013.   Anxiety    a.) on BZO (alprazolam ) PRN   Aortic atherosclerosis (HCC)    Atrial fibrillation (HCC)    a.) CHA2DS2-VASc = 6 (age x2, CHF, HTN, PVD/aortic plaque, T2DM). b.) rate/rhythm maintained without use of pharmacological intervention; chronically anticoagulated using dose reduced apixaban.   BPH (benign prostatic hyperplasia)    CHF (congestive heart failure) (HCC)    a.)  TTE 03/16/2021: EF 63%; moderate LVH; mild BAE mild to moderate pan valvular regurgitation; G1DD.   CKD (chronic kidney disease), stage III (HCC)    Colon polyp 07/07/2015   TUBULAR ADENOMA WITH AT LEAST HIGH-GRADE / Dr Leatrice Provost   COPD (chronic obstructive pulmonary disease) (HCC)    Coronary artery disease    a.) coronary CTA 01/10/2015 CA score 722.3; mod-sev LAD disease; mild LCx disease; 50-60% RCA disease.   Current use of long term anticoagulation    a.) dose reduced apixaban   Diverticulosis    Erectile dysfunction    GERD (gastroesophageal reflux disease)    Gout    Grade I diastolic dysfunction    Heart murmur    Hepatic cyst    Hepatic steatosis    Hepatitis    Hypercholesterolemia    Hypertension    Iron  deficiency anemia    Migraines    Moderate tricuspid regurgitation by prior echocardiogram    Multiple myeloma (HCC)    Nephrolithiasis    OSA on  CPAP    Presence of permanent cardiac pacemaker    Prostate cancer (HCC) 01/01/13, 01/30/14   Gleason 3+4=7, volume 46.6 cc   PVD (peripheral vascular disease) (HCC)    Renal cyst    Rheumatoid arthritis (HCC)    Helma Argyle/P radiation therapy  04/03/2014 through 06/04/2014                                                      Prostate 7800 cGy in 40 sessions                           SSS (sick sinus syndrome) (HCC)    a.) Dempsy Damiano/p Medtronic Azure PPM placement 08/26/2016   T2DM (type 2 diabetes mellitus) (HCC)    Third degree heart block (HCC) 08/26/2016   a.) Gabriana Wilmott/p PPM placement (Medtronic Azure XT DR MRI [id number] [place] Outten/N: [id number]) on 08/26/2016   Vitamin D deficiency     Past Surgical History:  Procedure Laterality Date   ABDOMINAL AORTIC ANEURYSM REPAIR  11/05/2013   CATARACT EXTRACTION W/PHACO Left 06/22/2023   Procedure: CATARACT EXTRACTION PHACO AND INTRAOCULAR LENS PLACEMENT (IOC) LEFT  CLAREON VIVITY TORIC  17.82  01:15.5;  Surgeon: Annell Kidney, MD;  Location: [facility];  Service: Ophthalmology;  Laterality: Left;   CATARACT EXTRACTION W/PHACO Right 07/06/2023   Procedure: CATARACT EXTRACTION PHACO AND INTRAOCULAR LENS PLACEMENT (IOC) RIGHT DIABETIC  CLAREON VIVITY TORIC;  Surgeon: Annell Kidney, MD;  Location: [facility];  Service: Ophthalmology;  Laterality: Right;  17.01 1:01.9   COLON SURGERY  08/06/2015   Right hemicolectomy for tubulovillous adenoma with high-grade dysplasia.   COLONOSCOPY WITH PROPOFOL  N/A 07/07/2015   Procedure: COLONOSCOPY WITH PROPOFOL ;  Surgeon: Luella Sager, MD;  Location: [facility];  Service: Endoscopy;  Laterality: N/A;   COLONOSCOPY WITH PROPOFOL  N/A 10/28/2016   Procedure: COLONOSCOPY WITH PROPOFOL ;  Surgeon: Luke Salaam, MD;  Location: [facility] END[facility]COPY;  Service: Endoscopy;  Laterality: N/A;   CYSTOSCOPY W/ RETROGRADES Bilateral 11/16/2021   Procedure: CYSTOSCOPY WITH RETROGRADE PYELOGRAM;  Surgeon: Dustin Gimenez, MD;   Location: [facility];  Service: Urology;  Laterality: Bilateral;   CYSTOSCOPY WITH BIOPSY N/A 11/16/2021   Procedure: CYSTOSCOPY WITH BLADDER BIOPSY;  Surgeon: Dustin Gimenez, MD;  Location: [facility];  Service: Urology;  Laterality: N/A;   ESOPHAGOGASTRODUODENOSCOPY (EGD) WITH PROPOFOL  N/A 10/28/2016   Procedure: ESOPHAGOGASTRODUODENOSCOPY (EGD) WITH PROPOFOL ;  Surgeon: Luke Salaam, MD;  Location: [facility] ENDOSCOPY;  Service: Endoscopy;  Laterality: N/A;   GIVENS CAPSULE STUDY N/A 07/25/2017   Procedure: GIVENS CAPSULE STUDY;  Surgeon: Luke Salaam, MD;  Location: [facility] END[facility];  Service: Gastroenterology;  Laterality: N/A;   GIVENS CAPSULE STUDY N/A 01/29/2019   Procedure: GIVENS CAPSULE STUDY;  Surgeon: Luke Salaam, MD;  Location: [facility] END[facility]COPY;  Service: Gastroenterology;  Laterality: N/A;   LAPAROSCOPIC RIGHT COLECTOMY Right 08/08/2015   Procedure: LAPAROSCOPIC RIGHT COLECTOMY;  Surgeon: Marshall Skeeter, MD;  Location: [facility];  Service: General;  Laterality: Right;   NASAL SINUS SURGERY     PACEMAKER INSERTION Left 08/26/2016   Procedure: INSERTION PACEMAKER;  Surgeon: Percival Brace, MD;  Location: [facility];  Service: Cardiovascular;  Laterality: Left;   PROSTATE BIOPSY  01/01/13, 01/30/14   Gleason 3+3=6, vol 46.6 cc   TONSILLECTOMY     uvula surgery     for sleep apnea    Social History   Socioeconomic History   Marital status: Divorced    Spouse name: Not on file   Number of children: Not on file   Years of education: Not on file   Highest education level: Not on file  Occupational History   Not on file  Tobacco Use   Smoking status: Every Day    Current packs/day: 1.00    Average packs/day: 1 pack/day for 61.4 years (61.4 ttl pk-yrs)    Types: Cigarettes    Start date: 1964   Smokeless tobacco: Never   Tobacco comments:    DOWN TO 1/2 PPD  Vaping Use   Vaping status: Never Used  Substance and Sexual Activity   Alcohol use: Yes    Alcohol/week: 1.0 standard  drink of alcohol    Types: 1 Cans of beer per week    Comment: occasionally   Drug use: No   Sexual activity: Not Currently  Other Topics Concern   Not on file  Social History Narrative   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   Social Drivers of Health   Financial Resource Strain: Not on file  Food Insecurity: No Food Insecurity (09/14/2022)   Hunger Vital Sign    Worried About Running Out of Food in the Last Year: Never true  Ran Out of Food in the Last Year: Never true  Transportation Needs: No Transportation Needs (09/14/2022)   PRAPARE - Administrator, Civil Service (Medical): No    Lack of Transportation (Non-Medical): No  Physical Activity: Not on file  Stress: Not on file  Social Connections: Not on file  Intimate Partner Violence: Not on file    Family History  Problem Relation Age of Onset   Heart attack Mother    Cirrhosis Father    Alzheimer'Korayma Hagwood disease Sister    Pancreatic cancer Brother    Diabetes Brother    Diabetes Daughter        medication induced for cancer treatments   Cancer Daughter        breast, brain    No Known Allergies  Outpatient Medications Prior to Visit  Medication Sig Note   albuterol  (PROVENTIL  HFA;VENTOLIN  HFA) 108 (90 Base) MCG/ACT inhaler Inhale 1 puff into the lungs every 6 (six) hours as needed.    allopurinol  (ZYLOPRIM ) 100 MG tablet Take 1 tablet (100 mg total) by mouth daily.    ALPRAZolam  (XANAX ) 0.25 MG tablet Take by mouth 2 (two) times daily as needed.    BREO ELLIPTA  100-25 MCG/ACT AEPB Inhale 1 puff by mouth once daily    cetirizine  (ZYRTEC ) 10 MG tablet Take 1 tablet by mouth once daily    Cholecalciferol (VITAMIN D3) 1.25 MG (50000 UT) CAPS Take 1 capsule by mouth once a week    empagliflozin  (JARDIANCE ) 25 MG TABS tablet Take 1 tablet (25 mg total) by mouth every morning.    ezetimibe (ZETIA) 10 MG tablet Take 1 tablet by mouth once daily    folic acid  (FOLVITE ) 1 MG tablet Take 1 mg by mouth daily.    HUMIRA  PEN 40 MG/0.4ML PNKT Inject 40 mg into the skin every 14 (fourteen) days.    methotrexate  (RHEUMATREX) 2.5 MG tablet Take 10 mg by mouth once a week.    mupirocin  ointment (BACTROBAN ) 2 % APPLY TO RIGHT GREAT TOE ONCE DAILY UNTIL HEALED    pantoprazole  (PROTONIX ) 20 MG tablet Take 1 tablet by mouth once daily    tamsulosin  (FLOMAX ) 0.4 MG CAPS capsule Take 1 capsule (0.4 mg total) by mouth daily.    traMADol -acetaminophen  (ULTRACET ) 37.5-325 MG tablet TAKE 1 TABLET BY MOUTH EVERY 6 HOURS AS NEEDED FOR PAIN    triamcinolone  cream (KENALOG ) 0.1 % Apply 1 Application topically 2 (two) times daily. to affected area    [DISCONTINUED] atorvastatin  (LIPITOR) 80 MG tablet Take 1 tablet (80 mg total) by mouth at bedtime.    [DISCONTINUED] Azelastine  HCl 137 MCG/SPRAY SOLN Place 1 spray into the nose daily.    [DISCONTINUED] fluticasone  (FLONASE ) 50 MCG/ACT nasal spray Place 1 spray into both nostrils daily as needed for rhinitis.    [DISCONTINUED] gabapentin  (NEURONTIN ) 400 MG capsule TAKE 1 CAPSULE BY MOUTH THREE TIMES DAILY    [DISCONTINUED] isosorbide  mononitrate (IMDUR ) 60 MG 24 hr tablet Take 1 tablet by mouth once daily 10/25/2023: hypotension   apixaban (ELIQUIS) 2.5 MG TABS tablet Take 1 tablet by mouth twice daily    metoprolol succinate (TOPROL-XL) 25 MG 24 hr tablet Take 1 tablet by mouth once daily    [DISCONTINUED] Finerenone  (KERENDIA ) 10 MG TABS Take 1 tablet by mouth once daily    No facility-administered medications prior to visit.    Review of Systems  Constitutional:  Positive for weight loss.  HENT:  Positive for congestion.  Nasal discharge  Eyes: Negative.   Cardiovascular: Negative.   Gastrointestinal: Negative.   Genitourinary: Negative.   Musculoskeletal:  Positive for back pain.  Skin: Negative.   Neurological: Negative.   Endo/Heme/Allergies:  Positive for environmental allergies.  Psychiatric/Behavioral: Negative.         Objective:   BP 109/79   Pulse  78   Temp (!) 96.8 F (36 C) (Tympanic)   Ht 5\' 11"  (1.803 m)   Wt 213 lb 12.8 oz (97 kg)   SpO2 94%   BMI 29.82 kg/m   Vitals:   10/25/23 1047  BP: 109/79  Pulse: 78  Temp: (!) 96.8 F (36 C)  Height: 5\' 11"  (1.803 m)  Weight: 213 lb 12.8 oz (97 kg)  SpO2: 94%  TempSrc: Tympanic  BMI (Calculated): 29.83    Physical Exam Vitals reviewed.  Constitutional:      Appearance: Normal appearance.  HENT:     Head: Normocephalic.     Left Ear: There is no impacted cerumen.     Nose: Nose normal.     Mouth/Throat:     Mouth: Mucous membranes are moist.     Pharynx: No posterior oropharyngeal erythema.  Eyes:     Extraocular Movements: Extraocular movements intact.     Pupils: Pupils are equal, round, and reactive to light.  Cardiovascular:     Rate and Rhythm: Regular rhythm.     Chest Wall: PMI is not displaced.     Pulses: Normal pulses.     Heart sounds: Normal heart sounds. No murmur heard. Pulmonary:     Effort: Pulmonary effort is normal.     Breath sounds: Normal air entry. Rales (scanty basal) present. No rhonchi.  Abdominal:     General: Abdomen is flat. Bowel sounds are normal. There is no distension.     Palpations: Abdomen is soft. There is no hepatomegaly, splenomegaly or mass.     Tenderness: There is no abdominal tenderness.  Musculoskeletal:        General: Normal range of motion.     Cervical back: Normal range of motion and neck supple.     Right lower leg: No edema.     Left lower leg: No edema.  Skin:    General: Skin is warm and dry.  Neurological:     General: No focal deficit present.     Mental Status: He is alert and oriented to person, place, and time.     Cranial Nerves: No cranial nerve deficit.     Motor: No weakness.  Psychiatric:        Mood and Affect: Mood normal.        Behavior: Behavior normal.      Results for orders placed or performed in visit on 10/25/23  POCT CBG (Fasting - Glucose)  Result Value Ref Range   Glucose  Fasting, POC 109 (A) 70 - 99 mg/dL    Recent Results (from the past 2160 hours)  Urinalysis, Complete     Status: Abnormal   Collection Time: 10/20/23  9:47 AM  Result Value Ref Range   Specific Gravity, UA 1.020 1.005 - 1.030   pH, UA 6.0 5.0 - 7.5   Color, UA Yellow Yellow   Appearance Ur Clear Clear   Leukocytes,UA Negative Negative   Protein,UA Negative Negative/Trace   Glucose, UA 3+ (A) Negative   Ketones, UA Negative Negative   RBC, UA Negative Negative   Bilirubin, UA Negative Negative   Urobilinogen, Ur 0.2  0.2 - 1.0 mg/dL   Nitrite, UA Negative Negative   Microscopic Examination See below:   Microscopic Examination     Status: Abnormal   Collection Time: 10/20/23  9:47 AM   Urine  Result Value Ref Range   WBC, UA 0-5 0 - 5 /hpf   RBC, Urine 0-2 0 - 2 /hpf   Epithelial Cells (non renal) >10 (A) 0 - 10 /hpf   Mucus, UA Present (A) Not Estab.   Bacteria, UA Few None seen/Few  BLADDER SCAN AMB NON-IMAGING     Status: None   Collection Time: 10/20/23  9:58 AM  Result Value Ref Range   Scan Result 0ml   PSA     Status: None   Collection Time: 10/20/23 10:41 AM  Result Value Ref Range   Prostate Specific Ag, Serum 0.2 0.0 - 4.0 ng/mL    Comment: Roche ECLIA methodology. According to the American Urological Association, Serum PSA should decrease and remain at undetectable levels after radical prostatectomy. The AUA defines biochemical recurrence as an initial PSA value 0.2 ng/mL or greater followed by a subsequent confirmatory PSA value 0.2 ng/mL or greater. Values obtained with different assay methods or kits cannot be used interchangeably. Results cannot be interpreted as absolute evidence of the presence or absence of malignant disease.   BMP8+Anion Gap     Status: Abnormal   Collection Time: 10/20/23 11:12 AM  Result Value Ref Range   Glucose 94 70 - 99 mg/dL   BUN 20 8 - 27 mg/dL   Creatinine, Ser 6.21 (H) 0.76 - 1.27 mg/dL   eGFR 36 (L) >30 QM/VHQ/4.69    BUN/Creatinine Ratio 11 10 - 24   Sodium 139 134 - 144 mmol/L   Potassium 5.6 (H) 3.5 - 5.2 mmol/L   Chloride 104 96 - 106 mmol/L   CO2 17 (L) 20 - 29 mmol/L   Anion Gap 18.0 10.0 - 18.0 mmol/L   Calcium  9.6 8.6 - 10.2 mg/dL  Comprehensive metabolic panel with GFR     Status: None   Collection Time: 10/20/23 11:12 AM  Result Value Ref Range   Total Protein 7.8 6.0 - 8.5 g/dL   Albumin  4.0 3.7 - 4.7 g/dL   Globulin, Total 3.8 1.5 - 4.5 g/dL   Bilirubin Total 0.5 0.0 - 1.2 mg/dL   Alkaline Phosphatase 89 44 - 121 IU/L   AST 18 0 - 40 IU/L   ALT 15 0 - 44 IU/L  Lipid panel     Status: None   Collection Time: 10/20/23 11:12 AM  Result Value Ref Range   Cholesterol, Total 119 100 - 199 mg/dL   Triglycerides 60 0 - 149 mg/dL   HDL 43 >62 mg/dL   VLDL Cholesterol Cal 13 5 - 40 mg/dL   LDL Chol Calc (NIH) 63 0 - 99 mg/dL   Chol/HDL Ratio 2.8 0.0 - 5.0 ratio    Comment:                                   T. Chol/HDL Ratio                                             Men  Women  1/2 Avg.Risk  3.4    3.3                                   Avg.Risk  5.0    4.4                                2X Avg.Risk  9.6    7.1                                3X Avg.Risk 23.4   11.0   Hemoglobin A1c     Status: Abnormal   Collection Time: 10/20/23 11:12 AM  Result Value Ref Range   Hgb A1c MFr Bld 6.2 (H) 4.8 - 5.6 %    Comment:          Prediabetes: 5.7 - 6.4          Diabetes: >6.4          Glycemic control for adults with diabetes: <7.0    Est. average glucose Bld gHb Est-mCnc 131 mg/dL  POCT CBG (Fasting - Glucose)     Status: Abnormal   Collection Time: 10/25/23 10:51 AM  Result Value Ref Range   Glucose Fasting, POC 109 (A) 70 - 99 mg/dL      Assessment & Plan:  Instructed to hold Imdur . Reassess in two weeks and resume at a lower dose if appropriate. Problem List Items Addressed This Visit       Respiratory   Seasonal allergic rhinitis due to pollen    Relevant Medications   Azelastine  HCl 137 MCG/SPRAY SOLN   fluticasone  (FLONASE ) 50 MCG/ACT nasal spray     Endocrine   Diabetes mellitus (HCC) - Primary   Relevant Medications   atorvastatin  (LIPITOR) 80 MG tablet   Other Relevant Orders   POCT CBG (Fasting - Glucose) (Completed)   Microalbuminuria due to type 2 diabetes mellitus (HCC)   Relevant Medications   Finerenone  (KERENDIA ) 10 MG TABS   atorvastatin  (LIPITOR) 80 MG tablet     Other   HLD (hyperlipidemia)   Relevant Medications   atorvastatin  (LIPITOR) 80 MG tablet   Other Visit Diagnoses       Hyperkalemia       Relevant Medications   sodium zirconium cyclosilicate (LOKELMA) 10 g PACK packet       Return in about 2 weeks (around 11/08/2023) for BP followup.   Total time spent: 20 minutes  Arzella Bitters, MD  10/25/2023   This document may have been prepared by [facility] Voice Recognition software and as such may include unintentional dictation errors.

## 2023-10-26 ENCOUNTER — Inpatient Hospital Stay: Payer: Medicare PPO | Attending: Oncology

## 2023-10-26 DIAGNOSIS — C61 Malignant neoplasm of prostate: Secondary | ICD-10-CM | POA: Insufficient documentation

## 2023-10-26 DIAGNOSIS — F17210 Nicotine dependence, cigarettes, uncomplicated: Secondary | ICD-10-CM | POA: Insufficient documentation

## 2023-10-26 DIAGNOSIS — Z79899 Other long term (current) drug therapy: Secondary | ICD-10-CM | POA: Diagnosis not present

## 2023-10-26 DIAGNOSIS — G4733 Obstructive sleep apnea (adult) (pediatric): Secondary | ICD-10-CM | POA: Diagnosis not present

## 2023-10-26 DIAGNOSIS — R911 Solitary pulmonary nodule: Secondary | ICD-10-CM | POA: Diagnosis not present

## 2023-10-26 DIAGNOSIS — Z79631 Long term (current) use of antimetabolite agent: Secondary | ICD-10-CM | POA: Insufficient documentation

## 2023-10-26 DIAGNOSIS — R5383 Other fatigue: Secondary | ICD-10-CM | POA: Insufficient documentation

## 2023-10-26 DIAGNOSIS — C9000 Multiple myeloma not having achieved remission: Secondary | ICD-10-CM | POA: Diagnosis not present

## 2023-10-26 DIAGNOSIS — M069 Rheumatoid arthritis, unspecified: Secondary | ICD-10-CM | POA: Insufficient documentation

## 2023-10-26 DIAGNOSIS — N183 Chronic kidney disease, stage 3 unspecified: Secondary | ICD-10-CM | POA: Diagnosis not present

## 2023-10-26 DIAGNOSIS — D472 Monoclonal gammopathy: Secondary | ICD-10-CM

## 2023-10-26 LAB — CBC WITH DIFFERENTIAL (CANCER CENTER ONLY)
Abs Immature Granulocytes: 0.07 10*3/uL (ref 0.00–0.07)
Basophils Absolute: 0 10*3/uL (ref 0.0–0.1)
Basophils Relative: 1 %
Eosinophils Absolute: 0.3 10*3/uL (ref 0.0–0.5)
Eosinophils Relative: 5 %
HCT: 46.8 % (ref 39.0–52.0)
Hemoglobin: 14.9 g/dL (ref 13.0–17.0)
Immature Granulocytes: 1 %
Lymphocytes Relative: 16 %
Lymphs Abs: 1.1 10*3/uL (ref 0.7–4.0)
MCH: 29.6 pg (ref 26.0–34.0)
MCHC: 31.8 g/dL (ref 30.0–36.0)
MCV: 92.9 fL (ref 80.0–100.0)
Monocytes Absolute: 0.6 10*3/uL (ref 0.1–1.0)
Monocytes Relative: 9 %
Neutro Abs: 4.8 10*3/uL (ref 1.7–7.7)
Neutrophils Relative %: 68 %
Platelet Count: 165 10*3/uL (ref 150–400)
RBC: 5.04 MIL/uL (ref 4.22–5.81)
RDW: 21.6 % — ABNORMAL HIGH (ref 11.5–15.5)
WBC Count: 7 10*3/uL (ref 4.0–10.5)
nRBC: 0 % (ref 0.0–0.2)

## 2023-10-26 LAB — CMP (CANCER CENTER ONLY)
ALT: 14 U/L (ref 0–44)
AST: 18 U/L (ref 15–41)
Albumin: 3.7 g/dL (ref 3.5–5.0)
Alkaline Phosphatase: 65 U/L (ref 38–126)
Anion gap: 7 (ref 5–15)
BUN: 27 mg/dL — ABNORMAL HIGH (ref 8–23)
CO2: 21 mmol/L — ABNORMAL LOW (ref 22–32)
Calcium: 8.7 mg/dL — ABNORMAL LOW (ref 8.9–10.3)
Chloride: 106 mmol/L (ref 98–111)
Creatinine: 1.95 mg/dL — ABNORMAL HIGH (ref 0.61–1.24)
GFR, Estimated: 34 mL/min — ABNORMAL LOW (ref >60)
Glucose, Bld: 92 mg/dL (ref 70–99)
Potassium: 4.7 mmol/L (ref 3.5–5.1)
Sodium: 134 mmol/L — ABNORMAL LOW (ref 135–145)
Total Bilirubin: 0.8 mg/dL (ref 0.0–1.2)
Total Protein: 7.9 g/dL (ref 6.5–8.1)

## 2023-10-27 LAB — BETA 2 MICROGLOBULIN, SERUM: Beta-2 Microglobulin: 3.2 mg/L — ABNORMAL HIGH (ref 0.6–2.4)

## 2023-10-27 LAB — KAPPA/LAMBDA LIGHT CHAINS
Kappa free light chain: 224.2 mg/L — ABNORMAL HIGH (ref 3.3–19.4)
Kappa, lambda light chain ratio: 6.33 — ABNORMAL HIGH (ref 0.26–1.65)
Lambda free light chains: 35.4 mg/L — ABNORMAL HIGH (ref 5.7–26.3)

## 2023-10-28 LAB — MULTIPLE MYELOMA PANEL, SERUM
Albumin SerPl Elph-Mcnc: 3.4 g/dL (ref 2.9–4.4)
Albumin/Glob SerPl: 0.9 (ref 0.7–1.7)
Alpha 1: 0.2 g/dL (ref 0.0–0.4)
Alpha2 Glob SerPl Elph-Mcnc: 0.7 g/dL (ref 0.4–1.0)
B-Globulin SerPl Elph-Mcnc: 1 g/dL (ref 0.7–1.3)
Gamma Glob SerPl Elph-Mcnc: 1.9 g/dL — ABNORMAL HIGH (ref 0.4–1.8)
Globulin, Total: 3.8 g/dL (ref 2.2–3.9)
IgA: 244 mg/dL (ref 61–437)
IgG (Immunoglobin G), Serum: 1909 mg/dL — ABNORMAL HIGH (ref 603–1613)
IgM (Immunoglobulin M), Srm: 112 mg/dL (ref 15–143)
Total Protein ELP: 7.2 g/dL (ref 6.0–8.5)

## 2023-10-31 ENCOUNTER — Other Ambulatory Visit: Payer: Self-pay | Admitting: Internal Medicine

## 2023-10-31 DIAGNOSIS — J301 Allergic rhinitis due to pollen: Secondary | ICD-10-CM

## 2023-11-01 ENCOUNTER — Other Ambulatory Visit: Payer: Self-pay | Admitting: Cardiology

## 2023-11-03 ENCOUNTER — Encounter: Payer: Self-pay | Admitting: Oncology

## 2023-11-03 ENCOUNTER — Inpatient Hospital Stay (HOSPITAL_BASED_OUTPATIENT_CLINIC_OR_DEPARTMENT_OTHER): Payer: Medicare PPO | Admitting: Oncology

## 2023-11-03 ENCOUNTER — Telehealth: Payer: Self-pay

## 2023-11-03 VITALS — BP 117/83 | HR 89 | Temp 96.0°F | Resp 18 | Wt 216.0 lb

## 2023-11-03 DIAGNOSIS — D472 Monoclonal gammopathy: Secondary | ICD-10-CM | POA: Diagnosis not present

## 2023-11-03 DIAGNOSIS — F17200 Nicotine dependence, unspecified, uncomplicated: Secondary | ICD-10-CM | POA: Diagnosis not present

## 2023-11-03 DIAGNOSIS — M069 Rheumatoid arthritis, unspecified: Secondary | ICD-10-CM | POA: Diagnosis not present

## 2023-11-03 DIAGNOSIS — R918 Other nonspecific abnormal finding of lung field: Secondary | ICD-10-CM | POA: Diagnosis not present

## 2023-11-03 DIAGNOSIS — C9000 Multiple myeloma not having achieved remission: Secondary | ICD-10-CM | POA: Diagnosis not present

## 2023-11-03 DIAGNOSIS — F17210 Nicotine dependence, cigarettes, uncomplicated: Secondary | ICD-10-CM | POA: Diagnosis not present

## 2023-11-03 DIAGNOSIS — C61 Malignant neoplasm of prostate: Secondary | ICD-10-CM

## 2023-11-03 DIAGNOSIS — R911 Solitary pulmonary nodule: Secondary | ICD-10-CM | POA: Diagnosis not present

## 2023-11-03 DIAGNOSIS — Z79631 Long term (current) use of antimetabolite agent: Secondary | ICD-10-CM | POA: Diagnosis not present

## 2023-11-03 DIAGNOSIS — N183 Chronic kidney disease, stage 3 unspecified: Secondary | ICD-10-CM | POA: Diagnosis not present

## 2023-11-03 DIAGNOSIS — R5383 Other fatigue: Secondary | ICD-10-CM | POA: Diagnosis not present

## 2023-11-03 DIAGNOSIS — G4733 Obstructive sleep apnea (adult) (pediatric): Secondary | ICD-10-CM | POA: Diagnosis not present

## 2023-11-03 NOTE — Assessment & Plan Note (Signed)
 Repeat CT chest in Sept 2024 showed stable right upper lobe and right lower lobe lung nodules Repeat CT in Sept 2025

## 2023-11-03 NOTE — Assessment & Plan Note (Signed)
Encourage his smoke cessation effort.  Erythrocytosis has resolved.

## 2023-11-03 NOTE — Telephone Encounter (Signed)
 Request for bone marrow biopsy sent to IR.    MD 1-2 weeks after bone marrow biospy. Date pending.

## 2023-11-03 NOTE — Assessment & Plan Note (Signed)
 PSA is stable

## 2023-11-03 NOTE — Assessment & Plan Note (Addendum)
 Smoldering kappa light chain multiple myeloma/active myeloma if previous hypermetabolic lesion on PET scan was related to myeloma.Patient was treated with 4 cycles of RVD.  He is not currently on any maintenance therapy due to multiple comorbidities, borderline smoldering multiple myeloma status.    Lab Results  Component Value Date   MPROTEIN Not Observed 10/26/2023   KPAFRELGTCHN 224.2 (H) 10/26/2023   LAMBDASER 35.4 (H) 10/26/2023   KAPLAMBRATIO 6.33 (H) 10/26/2023    His kidney function is getting worse, no anemia.  No hypercalcemia. slow progressing light chain ratio. Obtain 24 hour UPEP, negative skeletal survey Repeat bone marrow biopsy

## 2023-11-03 NOTE — Progress Notes (Signed)
 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  He is not currently on any maintenance therapy due to multiple comorbidities, borderline smoldering multiple myeloma status.    Lab Results  Component Value Date   MPROTEIN Not Observed 10/26/2023   KPAFRELGTCHN 224.2 (H) 10/26/2023   LAMBDASER 35.4 (H) 10/26/2023   KAPLAMBRATIO 6.33 (H) 10/26/2023    His kidney function is getting worse, no anemia.  No hypercalcemia. slow progressing light chain ratio. Obtain 24 hour UPEP, negative skeletal survey Repeat bone marrow biopsy   Lung nodule Repeat CT chest in Sept 2024 showed stable right upper lobe and right lower lobe lung nodules Repeat CT in Sept 2025  Malignant neoplasm of prostate (HCC) PSA is stable.    Tobacco use disorder Encourage his smoke cessation effort.  Erythrocytosis has resolved.   Orders Placed This Encounter  Procedures   CT Chest Wo Contrast    Standing Status:   Future    Expected Date:   02/22/2024    Expiration Date:   11/02/2024    Preferred imaging location?:   [facility]   IR BONE MARROW BIOPSY & ASPIRATION    Standing Status:   Future    Expected Date:   11/10/2023    Expiration Date:   11/02/2024    Reason for Exam (SYMPTOM  OR DIAGNOSIS REQUIRED):   Smoldering kappa light chain multiple myeloma    Preferred Imaging Location?:   [facility]   IFE+PROTEIN ELECTRO, 24-HR UR    Standing Status:   Future    Expected Date:   11/10/2023    Expiration Date:   11/02/2024   Follow up after marrow biopsy  All questions were answered. The patient knows to call the clinic with any problems, questions or concerns.  Timmy Forbes, MD,  PhD [facility] Hematology Oncology 11/03/2023     HISTORY OF PRESENTING ILLNESS:  83 y.o.  male with PMH listed below who presents to follow up on the evaluation and management of his abnormal urine protein electrophoresis results. I reviewed the records from [facility], [facility] and [facility] was performed and HemOnc related medical problems are listed below.  He lives with a friend. He has 3 adult children.   1 Chronic Kidney disease, Stage III: patient follows up with Dr.Lateef.  2 Proteinuria:  02/18/2017: Albumin /Creatinine ratio was 7, Urine protein electrophoresis,Random Urine revealed M Spike 43.2%, total protein of 43.7mg /dl,  09/09/4096 Albumin /Creatinine ratio was 455.2, Albumin  996.5, , 2.15.2018 Albumin /Creatinine ratio was 80.3, Albumin  73.3, Urine protein electrophoresis,Random Urine revealed M Spike 29.7%, total protein of 29.1mg /dl, 06/25/1476 Serum protein electrophoresis did not detect M spike.  Autoimmune disorder work up showed Negative anti dsDNA, RNP antibodies, smith antibody, Sjogren antibodies, anti Jo-1, positive antichromotin antibodies, positive ANA,  3 Anemia of chronic kidney disease:  4 Iron  deficiency anemia: history of iron  deficiency, ferritin was 7 on 08/11/2016. He had EGD and colonoscopy that were done this year which showed duodenitis/esphagitis/diverticulosis/benigh polyps. # small bowel AVMs was ablated at [facility].    Rheumatoid arthritis: he follows up with Rheumatology and is on chronic MTX and Humaria  reports joint pains are controlled, not quite symptomatic.   Patient denies any persistent  bone pain or any pain. He continue to feel lack of energy, persistent, not improved with resting or taking naps. He also has lost 25 pounds in the past year which was unintentional.    # It is ambiguous whether he has truly symptomatic multiple myeloma or a smoldering myeloma, given that anemia can be secondary to iron   deficiency and CKD.  The hypermetabolic 2.1 cm hypermetabolic soft tissue lesion in the left heel, can reflect a Plasmacytoma,which should have been biopsied. However patient initially denies being on any blood thinner, later he was found to be on Plavix with pre biopsy questionnaire screening, and biopsy was held. Patient has to contact cardiologist to hold plavix for seven days before procedure. He feels frustrated about multiple workup and meanwhile he becomes more fatigues, with worsening of kidney function. Patient was reluctant about proceeding additional invasive procedures and wants to start treatment. Decision was made to start active MM treatment.   # He was evaluated by [facility] bone marrow transplant team for autologous bone marrow transplant. He is not considered to  good candidate for transplant. Patient cardiologist Dr. Phyllis Breeze had started patient on Lasix  20 mg daily. Lasix  was discontinued by [facility] bone marrow transplant team as patient was having dizziness and borderline blood pressure during his clinic visit there.  10/02/2018 He had follow-up left foot/ankle x-ray as a follow-up of his left heel lesion which was initially presented on PET scan and not detectable on follow-up PET scan.  Images were reviewed by me.  No suspicious acute or subacute osseous abnormality  Current Treatment:  S/p RVD x 4 cycles tolerates well except cytopenia.  Revlimid  10 mg PO once per day on days 1 to 14 (dose reduced due to GFR) Revlimid  renal dosing of 10mg ,  Velcade  1.3 mg/m2 IV once per day on days 1,  8, 15 Dexamethasone  20 mg PO once per day on days 1, 8, 15 given patient's diabetes Given patient's multiple comorbidity and the side effects of cytopenia during his RVD treatment and the uncertainty of this is really a site of plasmacytoma or not, I recommend not to continue him on maintenance Revlimid .   # repeat small bowel capsule study on 02/16/2019 Which showed multiple AVMs seen in the mid jejunum proximal  to the site of prior tattoo. # Patient was referred to [facility] and he is status post 06/19/2019 double-balloon enteroscopy with findings of 5 AVMs in the jejunum and 2 AVMs in the duodenum and AVMs were ablated with APC.  Tattoo was seen in the area. If further bleeding, recommend to consider treatment with octreotide. Patient takes Protonix  daily.    INTERVAL HISTORY Patient presents for follow-up of management of multiple myeloma/smoldering multiple myeloma and iron  deficiency, secondary erythrocytosis. Patient continues to smoke.  RA, he is on Humaria and methotrexate .  He recently took a course of tapering prednisone .  Patient has sleep apnea, he uses CPAP machine as much as he can, approximately 2-3 times per week. He has no new complaints.    Review of Systems  Constitutional:  Positive for fatigue. Negative for appetite change, chills, fever and unexpected weight change.  HENT:   Negative for hearing loss and voice change.   Eyes:  Negative for eye problems and icterus.  Respiratory:  Negative for chest tightness, cough and shortness of breath.   Cardiovascular:  Negative for chest pain and leg swelling.  Gastrointestinal:  Negative for abdominal distention, abdominal pain and diarrhea.  Endocrine: Negative for hot flashes.  Genitourinary:  Negative for difficulty urinating, dysuria and frequency.   Musculoskeletal:  Positive for arthralgias.  Skin:  Negative for itching and rash.  Neurological:  Negative for light-headedness and numbness.  Hematological:  Negative for adenopathy. Does not bruise/bleed easily.  Psychiatric/Behavioral:  Negative for confusion.      MEDICAL HISTORY:  Past Medical History:  Diagnosis Date   AAA (abdominal aortic aneurysm) (HCC)    a.) s/p EVAR 11/05/2013.   Anxiety    a.) on BZO (alprazolam ) PRN   Aortic atherosclerosis (HCC)    Atrial fibrillation (HCC)    a.) CHA2DS2-VASc = 6 (age x2, CHF, HTN, PVD/aortic plaque, T2DM). b.) rate/rhythm  maintained without use of pharmacological intervention; chronically anticoagulated using dose reduced apixaban.   BPH (benign prostatic hyperplasia)    CHF (congestive heart failure) (HCC)    a.)  TTE 03/16/2021: EF 63%; moderate LVH; mild BAE mild to moderate pan valvular regurgitation; G1DD.   CKD (chronic kidney disease), stage III (HCC)    Colon polyp 07/07/2015   TUBULAR ADENOMA WITH AT LEAST HIGH-GRADE / Dr Leatrice Provost   COPD (chronic obstructive pulmonary disease) (HCC)    Coronary artery disease    a.) coronary CTA 01/10/2015 CA score 722.3; mod-sev LAD disease; mild LCx disease; 50-60% RCA disease.   Current use of long term anticoagulation    a.) dose reduced apixaban   Diverticulosis    Erectile dysfunction    GERD (gastroesophageal reflux disease)    Gout    Grade I diastolic dysfunction    Heart murmur    Hepatic cyst    Hepatic steatosis    Hepatitis    Hypercholesterolemia    Hypertension    Iron  deficiency anemia    Migraines    Moderate tricuspid regurgitation by prior echocardiogram    Multiple myeloma (HCC)    Nephrolithiasis    OSA on CPAP    Presence of permanent cardiac pacemaker    Prostate cancer (HCC) 01/01/13, 01/30/14   Gleason 3+4=7, volume 46.6 cc   PVD (peripheral vascular disease) (HCC)    Renal cyst    Rheumatoid arthritis (HCC)    S/P radiation therapy  04/03/2014 through 06/04/2014                                                      Prostate 7800 cGy in 40 sessions                           SSS (sick sinus syndrome) (HCC)    a.) s/p Medtronic Azure PPM placement 08/26/2016   T2DM (type 2 diabetes mellitus) (HCC)    Third degree heart block (HCC) 08/26/2016   a.) s/p PPM placement (Medtronic Azure XT DR MRI [id number] S/N: [id number]) on 08/26/2016   Vitamin D deficiency     SURGICAL HISTORY: Past Surgical History:  Procedure Laterality Date   ABDOMINAL AORTIC ANEURYSM REPAIR  11/05/2013   CATARACT EXTRACTION W/PHACO Left 06/22/2023   Procedure:  CATARACT EXTRACTION PHACO AND INTRAOCULAR LENS PLACEMENT (IOC) LEFT  CLAREON VIVITY TORIC  17.82  01:15.5;  Surgeon: Annell Kidney, MD;  Location: [facility];  Service: Ophthalmology;  Laterality: Left;   CATARACT EXTRACTION W/PHACO Right 07/06/2023   Procedure: CATARACT EXTRACTION PHACO AND INTRAOCULAR LENS PLACEMENT (IOC) RIGHT DIABETIC  CLAREON VIVITY TORIC;  Surgeon: Annell Kidney, MD;  Location: [facility];  Service: Ophthalmology;  Laterality: Right;  17.01 1:01.9   COLON SURGERY  08/06/2015   Right hemicolectomy for tubulovillous adenoma with high-grade dysplasia.   COLONOSCOPY WITH PROPOFOL  N/A 07/07/2015   Procedure: COLONOSCOPY WITH PROPOFOL ;  Surgeon: Luella Sager, MD;  Location: [facility]COPY;  Service: Endoscopy;  Laterality: N/A;   COLONOSCOPY WITH PROPOFOL  N/A 10/28/2016   Procedure: COLONOSCOPY WITH PROPOFOL ;  Surgeon: Luke Salaam, MD;  Location: [facility]COPY;  Service: Endoscopy;  Laterality: N/A;   CYSTOSCOPY W/ RETROGRADES Bilateral 11/16/2021   Procedure: CYSTOSCOPY WITH RETROGRADE PYELOGRAM;  Surgeon: Dustin Gimenez, MD;  Location: [facility];  Service: Urology;  Laterality: Bilateral;   CYSTOSCOPY WITH BIOPSY N/A 11/16/2021   Procedure: CYSTOSCOPY WITH BLADDER BIOPSY;  Surgeon: Dustin Gimenez, MD;  Location: [facility];  Service: Urology;  Laterality: N/A;   ESOPHAGOGASTRODUODENOSCOPY (EGD) WITH PROPOFOL  N/A 10/28/2016   Procedure: ESOPHAGOGASTRODUODENOSCOPY (EGD) WITH PROPOFOL ;  Surgeon: Luke Salaam, MD;  Location: [facility] ENDOSCOPY;  Service: Endoscopy;  Laterality: N/A;   GIVENS CAPSULE STUDY N/A 07/25/2017   Procedure: GIVENS CAPSULE STUDY;  Surgeon: Luke Salaam, MD;  Location: [facility] ENDOSCOPY;  Service: Gastroenterology;  Laterality: N/A;   GIVENS CAPSULE STUDY N/A 01/29/2019   Procedure: GIVENS CAPSULE STUDY;  Surgeon: Luke Salaam, MD;  Location: [facility] END[facility]COPY;  Service: Gastroenterology;  Laterality: N/A;   LAPAROSCOPIC RIGHT  COLECTOMY Right 08/08/2015   Procedure: LAPAROSCOPIC RIGHT COLECTOMY;  Surgeon: Marshall Skeeter, MD;  Location: [facility];  Service: General;  Laterality: Right;   NASAL SINUS SURGERY     PACEMAKER INSERTION Left 08/26/2016   Procedure: INSERTION PACEMAKER;  Surgeon: Percival Brace, MD;  Location: [facility];  Service: Cardiovascular;  Laterality: Left;   PROSTATE BIOPSY  01/01/13, 01/30/14   Gleason 3+3=6, vol 46.6 cc   TONSILLECTOMY     uvula surgery     for sleep apnea    SOCIAL HISTORY: Social History   Socioeconomic History   Marital status: Divorced    Spouse name: Not on file   Number of children: Not on file   Years of education: Not on file   Highest education level: Not on file  Occupational History   Not on file  Tobacco Use   Smoking status: Every Day    Current packs/day: 1.00    Average packs/day: 1 pack/day for 61.4 years (61.4 ttl pk-yrs)    Types: Cigarettes    Start date: 1964   Smokeless tobacco: Never   Tobacco comments:    DOWN TO 1/2 PPD  Vaping Use   Vaping status: Never Used  Substance and Sexual Activity   Alcohol use: Yes    Alcohol/week: 1.0 standard drink of alcohol    Types: 1 Cans of beer per week    Comment: occasionally   Drug use: No   Sexual activity: Not Currently  Other Topics Concern   Not on file  Social History Narrative   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   Social Drivers of Health   Financial Resource Strain: Not on file  Food Insecurity: No Food Insecurity (09/14/2022)   Hunger Vital Sign    Worried About Running Out of Food in the Last Year: Never true    Ran Out of Food in the Last Year: Never true  Transportation Needs: No Transportation Needs (09/14/2022)   PRAPARE - Administrator, Civil Service (Medical): No    Lack of Transportation (Non-Medical): No  Physical Activity: Not  on file  Stress: Not on file  Social Connections: Not on file  Intimate Partner Violence: Not on file    FAMILY  HISTORY: Family History  Problem Relation Age of Onset   Heart attack Mother    Cirrhosis Father    Alzheimer's disease Sister    Pancreatic cancer Brother    Diabetes Brother    Diabetes Daughter        medication induced for cancer treatments   Cancer Daughter        breast, brain    ALLERGIES:  has no known allergies.  MEDICATIONS:  Current Outpatient Medications  Medication Sig Dispense Refill   albuterol  (PROVENTIL  HFA;VENTOLIN  HFA) 108 (90 Base) MCG/ACT inhaler Inhale 1 puff into the lungs every 6 (six) hours as needed.     allopurinol  (ZYLOPRIM ) 100 MG tablet Take 1 tablet (100 mg total) by mouth daily. 90 tablet 3   ALPRAZolam  (XANAX ) 0.25 MG tablet Take by mouth 2 (two) times daily as needed.     atorvastatin  (LIPITOR) 80 MG tablet Take 1 tablet (80 mg total) by mouth at bedtime. 90 tablet 0   Azelastine  HCl 137 MCG/SPRAY SOLN Place 1 spray into the nose daily. 30 mL 2   BREO ELLIPTA  100-25 MCG/ACT AEPB Inhale 1 puff by mouth once daily 60 each 3   cetirizine  (ZYRTEC ) 10 MG tablet Take 1 tablet by mouth once daily 30 tablet 5   Cholecalciferol (VITAMIN D3) 1.25 MG (50000 UT) CAPS Take 1 capsule by mouth once a week 12 capsule 0   ELIQUIS 2.5 MG TABS tablet Take 1 tablet by mouth twice daily 60 tablet 0   empagliflozin  (JARDIANCE ) 25 MG TABS tablet Take 1 tablet (25 mg total) by mouth every morning. 90 tablet 0   ezetimibe (ZETIA) 10 MG tablet Take 1 tablet by mouth once daily 90 tablet 1   Finerenone  (KERENDIA ) 10 MG TABS Take 1 tablet (10 mg total) by mouth daily. 30 tablet 1   fluticasone  (FLONASE ) 50 MCG/ACT nasal spray Place 1 spray into both nostrils daily as needed for rhinitis. 48 mL 0   folic acid  (FOLVITE ) 1 MG tablet Take 1 mg by mouth daily.     gabapentin  (NEURONTIN ) 400 MG capsule Take 1 capsule (400 mg total) by mouth 3 (three) times daily. 90 capsule 0   HUMIRA PEN 40 MG/0.4ML PNKT Inject 40 mg into the skin every 14 (fourteen) days.     methotrexate   (RHEUMATREX) 2.5 MG tablet Take 10 mg by mouth once a week.     metoprolol succinate (TOPROL-XL) 25 MG 24 hr tablet Take 1 tablet by mouth once daily 90 tablet 0   mupirocin  ointment (BACTROBAN ) 2 % APPLY TO RIGHT GREAT TOE ONCE DAILY UNTIL HEALED 66 g 0   pantoprazole  (PROTONIX ) 20 MG tablet Take 1 tablet by mouth once daily 90 tablet 0   tamsulosin  (FLOMAX ) 0.4 MG CAPS capsule Take 1 capsule (0.4 mg total) by mouth daily. 90 capsule 3   traMADol -acetaminophen  (ULTRACET ) 37.5-325 MG tablet TAKE 1 TABLET BY MOUTH EVERY 6 HOURS AS NEEDED FOR PAIN 30 tablet 0   triamcinolone  cream (KENALOG ) 0.1 % Apply 1 Application topically 2 (two) times daily. to affected area     No current facility-administered medications for this visit.      Aaron Aas  PHYSICAL EXAMINATION: ECOG PERFORMANCE STATUS: 1 - Symptomatic but completely ambulatory Vitals:   11/03/23 1100  BP: 117/83  Pulse: 89  Resp: 18  Temp: (!) 96 F (  35.6 C)  SpO2: 98%   Filed Weights   11/03/23 1100  Weight: 216 lb (98 kg)   Physical Exam Constitutional:      General: He is not in acute distress.    Appearance: He is not diaphoretic.  HENT:     Head: Normocephalic and atraumatic.     Mouth/Throat:     Pharynx: No oropharyngeal exudate.  Eyes:     General: No scleral icterus.       Right eye: No discharge.        Left eye: No discharge.     Pupils: Pupils are equal, round, and reactive to light.  Neck:     Vascular: No JVD.  Cardiovascular:     Rate and Rhythm: Normal rate and regular rhythm.     Heart sounds: Normal heart sounds. No murmur heard. Pulmonary:     Effort: Pulmonary effort is normal. No respiratory distress.     Breath sounds: Normal breath sounds.  Abdominal:     General: Bowel sounds are normal. There is no distension.     Palpations: Abdomen is soft. There is no mass.     Tenderness: There is no abdominal tenderness.  Musculoskeletal:        General: No tenderness. Normal range of motion.     Cervical  back: Normal range of motion and neck supple.  Lymphadenopathy:     Cervical: No cervical adenopathy.  Skin:    General: Skin is warm and dry.     Findings: No erythema.     Comments: Hypopigmentation of skin  Neurological:     Mental Status: He is alert and oriented to person, place, and time.     Cranial Nerves: No cranial nerve deficit.  Psychiatric:        Mood and Affect: Affect normal.     LABORATORY DATA:  I have reviewed the data as listed    Latest Ref Rng & Units 10/26/2023   10:37 AM 04/18/2023    2:04 PM 12/16/2022    2:05 PM  CBC  WBC 4.0 - 10.5 K/uL 7.0  8.1  8.1   Hemoglobin 13.0 - 17.0 g/dL 16.1  09.6  04.5   Hematocrit 39.0 - 52.0 % 46.8  47.4  46.3   Platelets 150 - 400 K/uL 165  221  189       Latest Ref Rng & Units 10/26/2023   10:37 AM 10/20/2023   11:12 AM 07/27/2023    9:40 AM  CMP  Glucose 70 - 99 mg/dL 92  94  99   BUN 8 - 23 mg/dL 27  20  18    Creatinine 0.61 - 1.24 mg/dL 4.09  8.11  9.14   Sodium 135 - 145 mmol/L 134  139  142   Potassium 3.5 - 5.1 mmol/L 4.7  5.6  4.7   Chloride 98 - 111 mmol/L 106  104  104   CO2 22 - 32 mmol/L 21  17  20    Calcium  8.9 - 10.3 mg/dL 8.7  9.6  9.4   Total Protein 6.5 - 8.1 g/dL 7.9  7.8  8.0   Total Bilirubin 0.0 - 1.2 mg/dL 0.8  0.5  0.4   Alkaline Phos 38 - 126 U/L 65  89  87   AST 15 - 41 U/L 18  18  15    ALT 0 - 44 U/L 14  15  16       Bone marrow biopsy 03/21/2017  Bone Marrow,  Aspirate,Biopsy, and Clot, left iliac - HYPERCELLULAR BONE MARROW FOR AGE WITH TRILINEAGE HEMATOPOIESIS. - PLASMACYTOSIS (PLASMA CELLS 8%)  Karyotype: loss of Y chromosome Cytogenetic MDS FISH Panel negative.   Bone marrow 04/06/2017  Bone Marrow, Aspirate,Biopsy, and Clot, right iliac and core - HYPERCELLULAR BONE MARROW FOR AGE WITH PLASMA CELL NEOPLASM - TRILINEAGE HEMATOPOIESIS. - SEE COMMENT. PERIPHERAL BLOOD: - MICROCYTIC-HYPOCHROMIC ANEMIA. Diagnosis Note The bone marrow is hypercellular with trilineage  hematopoiesis but with relative abundance of erythroid precursors and increased number of megakaryocytes with nonspecific changes. Significant dyspoiesis is not seen. Iron  stores are present with no ring sideroblasts. The plasma cells are increased in number representing 8% of all cells in the aspirate although focal areas in the core biopsy show 10 to 20% as primarily seen by CD138 stain. In situ hybridization for kappa and lambda light chains show kappa light chain restriction consistent with plasma cell neoplasm. Correlation with cytogenetic and FISH studies is recommended. (BNS:ecj/gt 04/07/2017)  IMAGE STUDIES I have personally reviewed below image results.  03/15/2017 DG bone survey Met: Nonspecific lucent lesion in the distal right ulna measuring 15-16 mm, but otherwise normal bone mineralization for age throughout the visible skeleton. A solitary lytic lesion in a distal extremity would be an unusual presentation of multiple myeloma, and I favor the distal right ulna lesion is benign. Recommend correlation with serum and urine protein electrophoresis.  04/14/2017 PET scan: 1. 2.1 cm hypermetabolic soft tissue lesion in the left heel, adjacent to the calcaneal tuberosity. 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 2. Mottled FDG accumulation diffusely in the marrow space without other frankly overt hypermetabolic bony lesion. 3. Coronary artery and thoracoabdominal aortic atherosclerosis with abdominal aortic stent graft visualized in situ. 4. Bilateral renal cysts and bilateral nonobstructing renal stones.   08/24/2017 PET scan 1. No hypermetabolic osseous lesions. No definite signs of multiple myeloma on CT imaging. 2. Interval improvement in soft tissue activity along the medial aspect of the left calcaneal tuberosity, likely plantar fasciitis. 3. New small left pleural effusion. 4. Bilateral inguinal hernias containing small bowel on the right and sigmoid colon on the left. No evidence of  incarceration or obstruction. 5. Otherwise stable incidental findings including diffuse atherosclerosis, bilateral renal cysts, emphysema and sigmoid diverticulosis.

## 2023-11-08 ENCOUNTER — Encounter: Payer: Self-pay | Admitting: Oncology

## 2023-11-08 ENCOUNTER — Telehealth: Payer: Self-pay | Admitting: *Deleted

## 2023-11-08 NOTE — Telephone Encounter (Signed)
 The patient states that he thinks it is about some kind of biopsy and he has been gotten calls about it.  He wants a call back to the nurse to go over the information

## 2023-11-08 NOTE — Telephone Encounter (Signed)
 Pt scheduled for Bm Bx on Wed 6/18 @ 8:30a, arrive 7:30a.   Please schedule MD (for results) on 7/1 @ 10:45. Pt aware of appt details and will also see them on Mychart.

## 2023-11-09 ENCOUNTER — Inpatient Hospital Stay: Attending: Oncology

## 2023-11-09 ENCOUNTER — Ambulatory Visit: Admitting: Internal Medicine

## 2023-11-09 ENCOUNTER — Encounter: Payer: Self-pay | Admitting: Internal Medicine

## 2023-11-09 ENCOUNTER — Other Ambulatory Visit: Payer: Self-pay

## 2023-11-09 VITALS — BP 92/62 | HR 75 | Ht 71.0 in | Wt 213.6 lb

## 2023-11-09 DIAGNOSIS — Z8546 Personal history of malignant neoplasm of prostate: Secondary | ICD-10-CM | POA: Insufficient documentation

## 2023-11-09 DIAGNOSIS — D472 Monoclonal gammopathy: Secondary | ICD-10-CM | POA: Diagnosis not present

## 2023-11-09 DIAGNOSIS — R911 Solitary pulmonary nodule: Secondary | ICD-10-CM | POA: Insufficient documentation

## 2023-11-09 DIAGNOSIS — E861 Hypovolemia: Secondary | ICD-10-CM | POA: Diagnosis not present

## 2023-11-09 DIAGNOSIS — E119 Type 2 diabetes mellitus without complications: Secondary | ICD-10-CM

## 2023-11-09 DIAGNOSIS — E860 Dehydration: Secondary | ICD-10-CM

## 2023-11-09 DIAGNOSIS — Z0130 Encounter for examination of blood pressure without abnormal findings: Secondary | ICD-10-CM

## 2023-11-09 LAB — GLUCOSE, POCT (MANUAL RESULT ENTRY): POC Glucose: 92 mg/dL (ref 70–99)

## 2023-11-09 MED ORDER — EMPAGLIFLOZIN 10 MG PO TABS: 10.0000 mg | ORAL_TABLET | Freq: Every day | ORAL | 0 refills | Status: DC

## 2023-11-09 NOTE — Progress Notes (Unsigned)
 Established Patient Office Visit  Subjective:  Patient ID: Omar [name], male    DOB: 12-Aug-1940  Age: 83 y.o. MRN: [id number]  Chief Complaint  Patient presents with  . Follow-up    2 week BP follow up    Here for BP f/u, bp still low despite discontinuing imdur  and admits to an episode of postural dizziness.    No other concerns at this time.   Past Medical History:  Diagnosis Date  . AAA (abdominal aortic aneurysm) (HCC)    a.) Ayuub Penley/p EVAR 11/05/2013.  Aaron Aas Anxiety    a.) on BZO (alprazolam ) PRN  . Aortic atherosclerosis (HCC)   . Atrial fibrillation (HCC)    a.) CHA2DS2-VASc = 6 (age x2, CHF, HTN, PVD/aortic plaque, T2DM). b.) rate/rhythm maintained without use of pharmacological intervention; chronically anticoagulated using dose reduced apixaban.  Aaron Aas BPH (benign prostatic hyperplasia)   . CHF (congestive heart failure) (HCC)    a.)  TTE 03/16/2021: EF 63%; moderate LVH; mild BAE mild to moderate pan valvular regurgitation; G1DD.  Aaron Aas CKD (chronic kidney disease), stage III (HCC)   . Colon polyp 07/07/2015   TUBULAR ADENOMA WITH AT LEAST HIGH-GRADE / Dr Leatrice Provost  . COPD (chronic obstructive pulmonary disease) (HCC)   . Coronary artery disease    a.) coronary CTA 01/10/2015 CA score 722.3; mod-sev LAD disease; mild LCx disease; 50-60% RCA disease.  . Current use of long term anticoagulation    a.) dose reduced apixaban  . Diverticulosis   . Erectile dysfunction   . GERD (gastroesophageal reflux disease)   . Gout   . Grade I diastolic dysfunction   . Heart murmur   . Hepatic cyst   . Hepatic steatosis   . Hepatitis   . Hypercholesterolemia   . Hypertension   . Iron  deficiency anemia   . Migraines   . Moderate tricuspid regurgitation by prior echocardiogram   . Multiple myeloma (HCC)   . Nephrolithiasis   . OSA on CPAP   . Presence of permanent cardiac pacemaker   . Prostate cancer (HCC) 01/01/13, 01/30/14   Gleason 3+4=7, volume 46.6 cc  . PVD (peripheral vascular  disease) (HCC)   . Renal cyst   . Rheumatoid arthritis (HCC)   . Shakayla Hickox/P radiation therapy  04/03/2014 through 06/04/2014                                                      Prostate 7800 cGy in 40 sessions                          . SSS (sick sinus syndrome) (HCC)    a.) Zayneb Baucum/p Medtronic Azure PPM placement 08/26/2016  . T2DM (type 2 diabetes mellitus) (HCC)   . Third degree heart block (HCC) 08/26/2016   a.) Terrill Alperin/p PPM placement (Medtronic Azure XT DR MRI [id number] Jobin Montelongo/N: [id number] H) on 08/26/2016  . Vitamin D deficiency     Past Surgical History:  Procedure Laterality Date  . ABDOMINAL AORTIC ANEURYSM REPAIR  11/05/2013  . CATARACT EXTRACTION W/PHACO Left 06/22/2023   Procedure: CATARACT EXTRACTION PHACO AND INTRAOCULAR LENS PLACEMENT (IOC) LEFT  CLAREON VIVITY TORIC  17.82  01:15.5;  Surgeon: Annell Kidney, MD;  Location: [facility];  Service: Ophthalmology;  Laterality: Left;  . CATARACT EXTRACTION W/PHACO Right 07/06/2023  Procedure: CATARACT EXTRACTION PHACO AND INTRAOCULAR LENS PLACEMENT (IOC) RIGHT DIABETIC  CLAREON VIVITY TORIC;  Surgeon: Annell Kidney, MD;  Location: [facility];  Service: Ophthalmology;  Laterality: Right;  17.01 1:01.9  . COLON SURGERY  08/06/2015   Right hemicolectomy for tubulovillous adenoma with high-grade dysplasia.  . COLONOSCOPY WITH PROPOFOL  N/A 07/07/2015   Procedure: COLONOSCOPY WITH PROPOFOL ;  Surgeon: Luella Sager, MD;  Location: [facility];  Service: Endoscopy;  Laterality: N/A;  . COLONOSCOPY WITH PROPOFOL  N/A 10/28/2016   Procedure: COLONOSCOPY WITH PROPOFOL ;  Surgeon: Luke Salaam, MD;  Location: [facility];  Service: Endoscopy;  Laterality: N/A;  . CYSTOSCOPY W/ RETROGRADES Bilateral 11/16/2021   Procedure: CYSTOSCOPY WITH RETROGRADE PYELOGRAM;  Surgeon: Dustin Gimenez, MD;  Location: [facility];  Service: Urology;  Laterality: Bilateral;  . CYSTOSCOPY WITH BIOPSY N/A 11/16/2021   Procedure: CYSTOSCOPY WITH  BLADDER BIOPSY;  Surgeon: Dustin Gimenez, MD;  Location: [facility];  Service: Urology;  Laterality: N/A;  . ESOPHAGOGASTRODUODENOSCOPY (EGD) WITH PROPOFOL  N/A 10/28/2016   Procedure: ESOPHAGOGASTRODUODENOSCOPY (EGD) WITH PROPOFOL ;  Surgeon: Luke Salaam, MD;  Location: [facility];  Service: Endoscopy;  Laterality: N/A;  . GIVENS CAPSULE STUDY N/A 07/25/2017   Procedure: GIVENS CAPSULE STUDY;  Surgeon: Luke Salaam, MD;  Location: [facility];  Service: Gastroenterology;  Laterality: N/A;  . GIVENS CAPSULE STUDY N/A 01/29/2019   Procedure: GIVENS CAPSULE STUDY;  Surgeon: Luke Salaam, MD;  Location: [facility];  Service: Gastroenterology;  Laterality: N/A;  . LAPAROSCOPIC RIGHT COLECTOMY Right 08/08/2015   Procedure: LAPAROSCOPIC RIGHT COLECTOMY;  Surgeon: Marshall Skeeter, MD;  Location: [facility];  Service: General;  Laterality: Right;  . NASAL SINUS SURGERY    . PACEMAKER INSERTION Left 08/26/2016   Procedure: INSERTION PACEMAKER;  Surgeon: Percival Brace, MD;  Location: [facility];  Service: Cardiovascular;  Laterality: Left;  . PROSTATE BIOPSY  01/01/13, 01/30/14   Gleason 3+3=6, vol 46.6 cc  . TONSILLECTOMY    . uvula surgery     for sleep apnea    Social History   Socioeconomic History  . Marital status: Divorced    Spouse name: Not on file  . Number of children: Not on file  . Years of education: Not on file  . Highest education level: Not on file  Occupational History  . Not on file  Tobacco Use  . Smoking status: Every Day    Current packs/day: 1.00    Average packs/day: 1 pack/day for 61.4 years (61.4 ttl pk-yrs)    Types: Cigarettes    Start date: 1964  . Smokeless tobacco: Never  . Tobacco comments:    DOWN TO 1/2 PPD  Vaping Use  . Vaping status: Never Used  Substance and Sexual Activity  . Alcohol use: Yes    Alcohol/week: 1.0 standard drink of alcohol    Types: 1 Cans of beer per week    Comment: occasionally  . Drug use: No  . Sexual activity:  Not Currently  Other Topics Concern  . 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 Worried About Programme researcher, broadcasting/film/video in the Last Year: Never true   . Ran Out of Food in the Last Year: Never true  Transportation Needs: No Transportation Needs (09/14/2022)   PRAPARE - Transportation   . Lack of  Transportation (Medical): No   . Lack of Transportation (Non-Medical): No  Physical Activity: Not on file  Stress: Not on file  Social Connections: Not on file  Intimate Partner Violence: Not on file    Family History  Problem Relation Age of Onset  . Heart attack Mother   . Cirrhosis Father   . Alzheimer'Tyreisha Ungar disease Sister   . Pancreatic cancer Brother   . Diabetes Brother   . Diabetes Daughter        medication induced for cancer treatments  . Cancer Daughter        breast, brain    No Known Allergies  Outpatient Medications Prior to Visit  Medication Sig  . albuterol  (PROVENTIL  HFA;VENTOLIN  HFA) 108 (90 Base) MCG/ACT inhaler Inhale 1 puff into the lungs every 6 (six) hours as needed.  . allopurinol  (ZYLOPRIM ) 100 MG tablet Take 1 tablet (100 mg total) by mouth daily.  . ALPRAZolam  (XANAX ) 0.25 MG tablet Take by mouth 2 (two) times daily as needed.  . atorvastatin  (LIPITOR) 80 MG tablet Take 1 tablet (80 mg total) by mouth at bedtime.  . Azelastine  HCl 137 MCG/SPRAY SOLN Place 1 spray into the nose daily.  . BREO ELLIPTA  100-25 MCG/ACT AEPB Inhale 1 puff by mouth once daily  . cetirizine  (ZYRTEC ) 10 MG tablet Take 1 tablet by mouth once daily  . Cholecalciferol (VITAMIN D3) 1.25 MG (50000 UT) CAPS Take 1 capsule by mouth once a week  . ELIQUIS 2.5 MG TABS tablet Take 1 tablet by mouth twice daily  . ezetimibe (ZETIA) 10 MG tablet Take 1 tablet by mouth once daily  . Finerenone  (KERENDIA ) 10 MG TABS Take 1 tablet  (10 mg total) by mouth daily.  . fluticasone  (FLONASE ) 50 MCG/ACT nasal spray Place 1 spray into both nostrils daily as needed for rhinitis.  . folic acid  (FOLVITE ) 1 MG tablet Take 1 mg by mouth daily.  . gabapentin  (NEURONTIN ) 400 MG capsule Take 1 capsule (400 mg total) by mouth 3 (three) times daily.  Aaron Aas HUMIRA PEN 40 MG/0.4ML PNKT Inject 40 mg into the skin every 14 (fourteen) days.  . methotrexate  (RHEUMATREX) 2.5 MG tablet Take 10 mg by mouth once a week.  . metoprolol succinate (TOPROL-XL) 25 MG 24 hr tablet Take 1 tablet by mouth once daily  . mupirocin  ointment (BACTROBAN ) 2 % APPLY TO RIGHT GREAT TOE ONCE DAILY UNTIL HEALED  . pantoprazole  (PROTONIX ) 20 MG tablet Take 1 tablet by mouth once daily  . tamsulosin  (FLOMAX ) 0.4 MG CAPS capsule Take 1 capsule (0.4 mg total) by mouth daily.  . traMADol -acetaminophen  (ULTRACET ) 37.5-325 MG tablet TAKE 1 TABLET BY MOUTH EVERY 6 HOURS AS NEEDED FOR PAIN  . triamcinolone  cream (KENALOG ) 0.1 % Apply 1 Application topically 2 (two) times daily. to affected area  . [DISCONTINUED] empagliflozin  (JARDIANCE ) 25 MG TABS tablet Take 1 tablet (25 mg total) by mouth every morning.   No facility-administered medications prior to visit.    Review of Systems  Neurological:  Positive for dizziness.  All other systems reviewed and are negative.      Objective:   BP 92/62 (Cuff Size: Normal)   Pulse 75   Ht 5\' 11"  (1.803 m)   Wt 213 lb 9.6 oz (96.9 kg)   SpO2 95%   BMI 29.79 kg/m   Vitals:   11/09/23 1128  BP: 92/62  Pulse: 75  Height: 5\' 11"  (1.803 m)  Weight: 213 lb 9.6 oz (96.9 kg)  SpO2: 95%  BMI (Calculated): 29.8    Physical Exam Vitals reviewed.  Constitutional:      Appearance: Normal appearance.  HENT:     Head: Normocephalic.     Left Ear: There is no impacted cerumen.     Nose: Nose normal.     Mouth/Throat:     Mouth: Mucous membranes are moist.     Pharynx: No posterior oropharyngeal erythema.  Eyes:      Extraocular Movements: Extraocular movements intact.     Pupils: Pupils are equal, round, and reactive to light.  Cardiovascular:     Rate and Rhythm: Regular rhythm.     Chest Wall: PMI is not displaced.     Pulses: Normal pulses.     Heart sounds: Normal heart sounds. No murmur heard. Pulmonary:     Effort: Pulmonary effort is normal.     Breath sounds: Normal air entry. Rales (scanty basal) present. No rhonchi.  Abdominal:     General: Abdomen is flat. Bowel sounds are normal. There is no distension.     Palpations: Abdomen is soft. There is no hepatomegaly, splenomegaly or mass.     Tenderness: There is no abdominal tenderness.  Musculoskeletal:        General: Normal range of motion.     Cervical back: Normal range of motion and neck supple.     Right lower leg: No edema.     Left lower leg: No edema.  Skin:    General: Skin is warm and dry.  Neurological:     General: No focal deficit present.     Mental Status: He is alert and oriented to person, place, and time.     Cranial Nerves: No cranial nerve deficit.     Motor: No weakness.  Psychiatric:        Mood and Affect: Mood normal.        Behavior: Behavior normal.     Results for orders placed or performed in visit on 11/09/23  POCT Glucose (CBG)  Result Value Ref Range   POC Glucose 92 70 - 99 mg/dl        Assessment & Plan:  . Reduce jardiance  to 10 mg and administer 500 ml NS today. Problem List Items Addressed This Visit       Endocrine   Diabetes mellitus (HCC)   Relevant Medications   empagliflozin  (JARDIANCE ) 10 MG TABS tablet   Other Relevant Orders   POCT Glucose (CBG) (Completed)   Other Visit Diagnoses       Hypotension due to hypovolemia    -  Primary     Dehydration           Return in about 2 weeks (around 11/23/2023) for BP followup.   Total time spent: 20 minutes  Arzella Bitters, MD  11/09/2023   This document may have been prepared by [facility] Voice Recognition  software and as such may include unintentional dictation errors.

## 2023-11-15 LAB — IFE+PROTEIN ELECTRO, 24-HR UR
% BETA, Urine: 22.7 %
ALPHA 1 URINE: 3 %
Albumin, U: 18 %
Alpha 2, Urine: 4.3 %
GAMMA GLOBULIN URINE: 52.1 %
M-SPIKE %, Urine: 28.8 % — ABNORMAL HIGH
M-Spike, Mg/24 Hr: 26 mg/(24.h) — ABNORMAL HIGH
Total Protein, Urine-Ur/day: 89 mg/(24.h) (ref 30–150)
Total Protein, Urine: 7.7 mg/dL
Total Volume: 1150

## 2023-11-16 ENCOUNTER — Other Ambulatory Visit: Payer: Self-pay | Admitting: Cardiovascular Disease

## 2023-11-17 ENCOUNTER — Other Ambulatory Visit: Payer: Self-pay | Admitting: Internal Medicine

## 2023-11-17 DIAGNOSIS — Z79899 Other long term (current) drug therapy: Secondary | ICD-10-CM | POA: Diagnosis not present

## 2023-11-17 DIAGNOSIS — M0579 Rheumatoid arthritis with rheumatoid factor of multiple sites without organ or systems involvement: Secondary | ICD-10-CM | POA: Diagnosis not present

## 2023-11-17 DIAGNOSIS — C9001 Multiple myeloma in remission: Secondary | ICD-10-CM | POA: Diagnosis not present

## 2023-11-22 ENCOUNTER — Other Ambulatory Visit: Payer: Self-pay | Admitting: Interventional Radiology

## 2023-11-22 DIAGNOSIS — Z01818 Encounter for other preprocedural examination: Secondary | ICD-10-CM

## 2023-11-22 NOTE — Progress Notes (Signed)
 Spoke with patient and provided pre-procedure instructions to arrive @ 7:30 for 8:30 procedure, NPO past midnight and have driver post-procedure. Patient verbalized understanding.

## 2023-11-23 ENCOUNTER — Ambulatory Visit
Admission: RE | Admit: 2023-11-23 | Discharge: 2023-11-23 | Disposition: A | Discharge status: Home / Self Care | Source: Ambulatory Visit | Attending: Oncology | Admitting: Oncology

## 2023-11-23 DIAGNOSIS — D472 Monoclonal gammopathy: Secondary | ICD-10-CM | POA: Diagnosis not present

## 2023-11-23 DIAGNOSIS — Z01818 Encounter for other preprocedural examination: Secondary | ICD-10-CM

## 2023-11-23 DIAGNOSIS — C9000 Multiple myeloma not having achieved remission: Secondary | ICD-10-CM | POA: Diagnosis not present

## 2023-11-23 DIAGNOSIS — Z1379 Encounter for other screening for genetic and chromosomal anomalies: Secondary | ICD-10-CM | POA: Diagnosis present

## 2023-11-23 HISTORY — PX: IR BONE MARROW BIOPSY & ASPIRATION: IMG5727

## 2023-11-23 LAB — CBC WITH DIFFERENTIAL/PLATELET
Abs Immature Granulocytes: 0.09 10*3/uL — ABNORMAL HIGH (ref 0.00–0.07)
Basophils Absolute: 0 10*3/uL (ref 0.0–0.1)
Basophils Relative: 0 %
Eosinophils Absolute: 0.4 10*3/uL (ref 0.0–0.5)
Eosinophils Relative: 4 %
HCT: 50.7 % (ref 39.0–52.0)
Hemoglobin: 16 g/dL (ref 13.0–17.0)
Immature Granulocytes: 1 %
Lymphocytes Relative: 9 %
Lymphs Abs: 0.8 10*3/uL (ref 0.7–4.0)
MCH: 29.6 pg (ref 26.0–34.0)
MCHC: 31.6 g/dL (ref 30.0–36.0)
MCV: 93.7 fL (ref 80.0–100.0)
Monocytes Absolute: 0.8 10*3/uL (ref 0.1–1.0)
Monocytes Relative: 9 %
Neutro Abs: 6.8 10*3/uL (ref 1.7–7.7)
Neutrophils Relative %: 77 %
Platelets: 243 10*3/uL (ref 150–400)
RBC: 5.41 MIL/uL (ref 4.22–5.81)
RDW: 21.6 % — ABNORMAL HIGH (ref 11.5–15.5)
WBC: 9 10*3/uL (ref 4.0–10.5)
nRBC: 0 % (ref 0.0–0.2)

## 2023-11-23 LAB — GLUCOSE, CAPILLARY: Glucose-Capillary: 118 mg/dL — ABNORMAL HIGH (ref 70–99)

## 2023-11-23 LAB — PROTIME-INR
INR: 1.1 (ref 0.8–1.2)
Prothrombin Time: 14.5 s (ref 11.4–15.2)

## 2023-11-23 MED ORDER — MIDAZOLAM HCL 2 MG/2ML IJ SOLN
INTRAMUSCULAR | Status: AC | PRN
  Administered 2023-11-23: 1 mg via INTRAVENOUS
  Administered 2023-11-23: .5 mg via INTRAVENOUS

## 2023-11-23 MED ORDER — FENTANYL CITRATE (PF) 100 MCG/2ML IJ SOLN
INTRAMUSCULAR | Status: AC
  Filled 2023-11-23: qty 2

## 2023-11-23 MED ORDER — HEPARIN SOD (PORK) LOCK FLUSH 100 UNIT/ML IV SOLN
INTRAVENOUS | Status: AC
  Filled 2023-11-23: qty 5

## 2023-11-23 MED ORDER — FENTANYL CITRATE (PF) 100 MCG/2ML IJ SOLN
INTRAMUSCULAR | Status: AC | PRN
  Administered 2023-11-23: 25 ug via INTRAVENOUS
  Administered 2023-11-23: 50 ug via INTRAVENOUS

## 2023-11-23 MED ORDER — MIDAZOLAM HCL 2 MG/2ML IJ SOLN
INTRAMUSCULAR | Status: AC
  Filled 2023-11-23: qty 2

## 2023-11-23 MED ORDER — SODIUM CHLORIDE 0.9 % IV SOLN: INTRAVENOUS | Status: DC

## 2023-11-23 MED ORDER — LIDOCAINE 1 % OPTIME INJ - NO CHARGE
10.0000 mL | Freq: Once | INTRAMUSCULAR | Status: AC
  Administered 2023-11-23: 10 mL via INTRADERMAL
  Filled 2023-11-23: qty 10

## 2023-11-23 NOTE — Procedures (Signed)
 Vascular and Interventional Radiology Procedure Note  Patient: Omar [name] DOB: Jul 27, 1940 Medical Record Number: [id number] Note Date/Time: 11/23/23 8:31 AM   Performing Physician: Art Largo, MD Assistant(s): None  Diagnosis: Hx MM   Procedure: BONE MARROW ASPIRATION and BIOPSY  Anesthesia: Conscious Sedation Complications: None Estimated Blood Loss: Minimal Specimens: Sent for Pathology  Findings:  Successful Fluoroscopy-guided bone marrow aspiration and biopsy A total of 1 cores were obtained. Hemostasis of the tract was achieved using Manual Pressure.  Plan: Bed rest for 1 hours.  See detailed procedure note with images in PACS. The patient tolerated the procedure well without incident or complication and was returned to Recovery in stable condition.    Art Largo, MD [facility]   Pager. [phone] Clinic. [phone]

## 2023-11-23 NOTE — Discharge Instructions (Signed)
 Bone Marrow Aspiration and Bone Marrow Biopsy, Adult, Care After This sheet gives you information about how to care for yourself after your procedure. If you have problems or questions, contact your health care provider.  What can I expect after the procedure?  After the procedure, it is common to have: Mild pain and tenderness. Swelling. Bruising.  Follow these instructions at home: Take over-the-counter or prescription medicines only as told by your health care provider. You may shower tomorrow Remove band aid tomorrow, replace with another bandaid if  site has any drainage from biopsy site. Wash your hands with soap and water before you touch your biopsy site  If soap and water are not available, use hand sanitizer. Change your dressing frequently for bleeding and/or drainage. Check your puncture site every day for signs of infection. Check for: More redness, swelling, or pain. More fluid or blood. Warmth. Pus or a bad smell. Return to your normal activities in 24hours.  Do not drive for 24 hours if you were given a medicine to help you relax (sedative). Keep all follow-up visits as told by your health care provider. This is important. Contact a health care provider if: You have more redness, swelling, or pain around the puncture site. You have more fluid or blood coming from the puncture site. Your puncture site feels warm to the touch. You have pus or a bad smell coming from the puncture site. You have a fever. Your pain is not controlled with medicine. This information is not intended to replace advice given to you by your health care provider. Make sure you discuss any questions you have with your health care provider. Document Released: 12/11/2004 Document Revised: 12/12/2015 Document Reviewed: 11/05/2015 Elsevier Interactive Patient Education  2018 [employer].

## 2023-11-23 NOTE — Progress Notes (Signed)
 Patient clinically stable post IR BMB per Dr Darylene Epley, tolerated well. Vitals stable pre and post procedure. Received Versed  1.5 mg along with Fentanyl  75 mcg IV for procedure. Report given to Vickye Grant RN ,Gwendlyn Lemmings

## 2023-11-23 NOTE — H&P (Signed)
 Chief Complaint: Patient was seen in consultation today for multiple myeloma   Procedure: Bone Marrow Biopsy with Aspiration  Referring Physician(s): Yu,Zhou  Supervising Physician: Art Largo  Patient Status: [facility] - Out-pt  History of Present Illness: Omar [name] is a 83 y.o. male with a history of smoldering kappa light chain multiple myeloma. He has undergone 4 cycles of RVD and is not currently on any maintenance therapy due to comorbidities. He is noted to have slightly worsening kidney function recently as well as slow progressing light chain ratio. Most recent skeletal survey was negative. IR consulted for repeat bone marrow biopsy.   Patient presents today with his daughter at the bedside. States that he has been doing well overall apart from some back pain from degenerative disc disease. NPO since midnight. All questions and concerns answered at the bedside.   Code Status: Full Code  Past Medical History:  Diagnosis Date   AAA (abdominal aortic aneurysm) (HCC)    a.) s/p EVAR 11/05/2013.   Anxiety    a.) on BZO (alprazolam ) PRN   Aortic atherosclerosis (HCC)    Atrial fibrillation (HCC)    a.) CHA2DS2-VASc = 6 (age x2, CHF, HTN, PVD/aortic plaque, T2DM). b.) rate/rhythm maintained without use of pharmacological intervention; chronically anticoagulated using dose reduced apixaban.   BPH (benign prostatic hyperplasia)    CHF (congestive heart failure) (HCC)    a.)  TTE 03/16/2021: EF 63%; moderate LVH; mild BAE mild to moderate pan valvular regurgitation; G1DD.   CKD (chronic kidney disease), stage III (HCC)    Colon polyp 07/07/2015   TUBULAR ADENOMA WITH AT LEAST HIGH-GRADE / Dr Leatrice Provost   COPD (chronic obstructive pulmonary disease) (HCC)    Coronary artery disease    a.) coronary CTA 01/10/2015 CA score 722.3; mod-sev LAD disease; mild LCx disease; 50-60% RCA disease.   Current use of long term anticoagulation    a.) dose reduced apixaban   Diverticulosis     Erectile dysfunction    GERD (gastroesophageal reflux disease)    Gout    Grade I diastolic dysfunction    Heart murmur    Hepatic cyst    Hepatic steatosis    Hepatitis    Hypercholesterolemia    Hypertension    Iron  deficiency anemia    Migraines    Moderate tricuspid regurgitation by prior echocardiogram    Multiple myeloma (HCC)    Nephrolithiasis    OSA on CPAP    Presence of permanent cardiac pacemaker    Prostate cancer (HCC) 01/01/13, 01/30/14   Gleason 3+4=7, volume 46.6 cc   PVD (peripheral vascular disease) (HCC)    Renal cyst    Rheumatoid arthritis (HCC)    S/P radiation therapy  04/03/2014 through 06/04/2014                                                      Prostate 7800 cGy in 40 sessions                           SSS (sick sinus syndrome) (HCC)    a.) s/p Medtronic Azure PPM placement 08/26/2016   T2DM (type 2 diabetes mellitus) (HCC)    Third degree heart block (HCC) 08/26/2016   a.) s/p PPM placement (Medtronic Azure XT DR MRI [id number]  S/N: [id number]) on 08/26/2016   Vitamin D deficiency     Past Surgical History:  Procedure Laterality Date   ABDOMINAL AORTIC ANEURYSM REPAIR  11/05/2013   CATARACT EXTRACTION W/PHACO Left 06/22/2023   Procedure: CATARACT EXTRACTION PHACO AND INTRAOCULAR LENS PLACEMENT (IOC) LEFT  CLAREON VIVITY TORIC  17.82  01:15.5;  Surgeon: Annell Kidney, MD;  Location: [facility];  Service: Ophthalmology;  Laterality: Left;   CATARACT EXTRACTION W/PHACO Right 07/06/2023   Procedure: CATARACT EXTRACTION PHACO AND INTRAOCULAR LENS PLACEMENT (IOC) RIGHT DIABETIC  CLAREON VIVITY TORIC;  Surgeon: Annell Kidney, MD;  Location: [facility];  Service: Ophthalmology;  Laterality: Right;  17.01 1:01.9   COLON SURGERY  08/06/2015   Right hemicolectomy for tubulovillous adenoma with high-grade dysplasia.   COLONOSCOPY WITH PROPOFOL  N/A 07/07/2015   Procedure: COLONOSCOPY WITH PROPOFOL ;  Surgeon: Luella Sager,  MD;  Location: [facility] ENDOSCOPY;  Service: Endoscopy;  Laterality: N/A;   COLONOSCOPY WITH PROPOFOL  N/A 10/28/2016   Procedure: COLONOSCOPY WITH PROPOFOL ;  Surgeon: Luke Salaam, MD;  Location: [facility] ENDOSCOPY;  Service: Endoscopy;  Laterality: N/A;   CYSTOSCOPY W/ RETROGRADES Bilateral 11/16/2021   Procedure: CYSTOSCOPY WITH RETROGRADE PYELOGRAM;  Surgeon: Dustin Gimenez, MD;  Location: [facility] ORS;  Service: Urology;  Laterality: Bilateral;   CYSTOSCOPY WITH BIOPSY N/A 11/16/2021   Procedure: CYSTOSCOPY WITH BLADDER BIOPSY;  Surgeon: Dustin Gimenez, MD;  Location: [facility] ORS;  Service: Urology;  Laterality: N/A;   ESOPHAGOGASTRODUODENOSCOPY (EGD) WITH PROPOFOL  N/A 10/28/2016   Procedure: ESOPHAGOGASTRODUODENOSCOPY (EGD) WITH PROPOFOL ;  Surgeon: Luke Salaam, MD;  Location: [facility] ENDOSCOPY;  Service: Endoscopy;  Laterality: N/A;   GIVENS CAPSULE STUDY N/A 07/25/2017   Procedure: GIVENS CAPSULE STUDY;  Surgeon: Luke Salaam, MD;  Location: [facility] ENDOSCOPY;  Service: Gastroenterology;  Laterality: N/A;   GIVENS CAPSULE STUDY N/A 01/29/2019   Procedure: GIVENS CAPSULE STUDY;  Surgeon: Luke Salaam, MD;  Location: [facility] ENDOSCOPY;  Service: Gastroenterology;  Laterality: N/A;   LAPAROSCOPIC RIGHT COLECTOMY Right 08/08/2015   Procedure: LAPAROSCOPIC RIGHT COLECTOMY;  Surgeon: Marshall Skeeter, MD;  Location: [facility] ORS;  Service: General;  Laterality: Right;   NASAL SINUS SURGERY     PACEMAKER INSERTION Left 08/26/2016   Procedure: INSERTION PACEMAKER;  Surgeon: Percival Brace, MD;  Location: [facility] ORS;  Service: Cardiovascular;  Laterality: Left;   PROSTATE BIOPSY  01/01/13, 01/30/14   Gleason 3+3=6, vol 46.6 cc   TONSILLECTOMY     uvula surgery     for sleep apnea    Allergies: Patient has no known allergies.  Medications: Prior to Admission medications   Medication Sig Start Date End Date Taking? Authorizing Provider  albuterol  (PROVENTIL  HFA;VENTOLIN  HFA) 108 (90 Base) MCG/ACT inhaler Inhale 1 puff  into the lungs every 6 (six) hours as needed. 06/17/18  Yes [provider]  allopurinol  (ZYLOPRIM ) 100 MG tablet Take 1 tablet (100 mg total) by mouth daily. 02/02/23  Yes Shari Daughters, MD  ALPRAZolam  (XANAX ) 0.25 MG tablet Take by mouth 2 (two) times daily as needed.   Yes [provider]  atorvastatin  (LIPITOR) 80 MG tablet Take 1 tablet (80 mg total) by mouth at bedtime. 10/25/23  Yes Shari Daughters, MD  Azelastine  HCl 137 MCG/SPRAY SOLN Place 1 spray into the nose daily. 10/25/23 01/23/24 Yes Tejan-Sie, S Ahmed, MD  BREO ELLIPTA  100-25 MCG/ACT AEPB Inhale 1 puff by mouth once daily 09/26/23  Yes Tejan-Sie, S Ahmed, MD  cetirizine  (ZYRTEC ) 10 MG tablet Take 1 tablet by mouth once daily 11/01/23  Yes Shari Daughters, MD  Cholecalciferol (VITAMIN D3) 1.25 MG (50000 UT) CAPS Take 1 capsule by mouth once a week 09/15/23  Yes Shari Daughters, MD  ELIQUIS 2.5 MG TABS tablet Take 1 tablet by mouth twice daily 11/03/23  Yes Cherrie Cornwall, MD  empagliflozin  (JARDIANCE ) 10 MG TABS tablet Take 1 tablet (10 mg total) by mouth daily before breakfast. 11/09/23 02/07/24 Yes Shari Daughters, MD  ezetimibe (ZETIA) 10 MG tablet Take 1 tablet by mouth once daily 09/26/23  Yes Tejan-Sie, John Muzzy, MD  Finerenone  (KERENDIA ) 10 MG TABS Take 1 tablet (10 mg total) by mouth daily. 10/25/23  Yes Shari Daughters, MD  fluticasone  (FLONASE ) 50 MCG/ACT nasal spray Place 1 spray into both nostrils daily as needed for rhinitis. 10/25/23 01/23/24 Yes Tejan-Sie, John Muzzy, MD  folic acid  (FOLVITE ) 1 MG tablet Take 1 mg by mouth daily. 02/16/23  Yes [provider]  gabapentin  (NEURONTIN ) 400 MG capsule Take 1 capsule (400 mg total) by mouth 3 (three) times daily. 10/25/23  Yes Tejan-Sie, John Muzzy, MD  HUMIRA PEN 40 MG/0.4ML PNKT Inject 40 mg into the skin every 14 (fourteen) days. 03/27/20  Yes [provider]  methotrexate  (RHEUMATREX) 2.5 MG tablet Take 10 mg by mouth once a week.   Yes  [provider]  metoprolol succinate (TOPROL-XL) 25 MG 24 hr tablet Take 1 tablet by mouth once daily 11/17/23  Yes Debborah Fairly A, MD  mupirocin  ointment (BACTROBAN ) 2 % APPLY TO RIGHT GREAT TOE ONCE DAILY UNTIL HEALED 02/07/23  Yes Standiford, Alexander F, DPM  pantoprazole  (PROTONIX ) 20 MG tablet Take 1 tablet by mouth once daily 10/11/23  Yes Tejan-Sie, John Muzzy, MD  tamsulosin  (FLOMAX ) 0.4 MG CAPS capsule Take 1 capsule (0.4 mg total) by mouth daily. 10/20/23  Yes McGowan, Cathleen Coach A, PA-C  traMADol -acetaminophen  (ULTRACET ) 37.5-325 MG tablet TAKE 1 TABLET BY MOUTH EVERY 6 HOURS AS NEEDED FOR PAIN 11/18/23   Shari Daughters, MD  triamcinolone  cream (KENALOG ) 0.1 % Apply 1 Application topically 2 (two) times daily. to affected area 06/30/22   [provider]     Family History  Problem Relation Age of Onset   Heart attack Mother    Cirrhosis Father    Alzheimer's disease Sister    Pancreatic cancer Brother    Diabetes Brother    Diabetes Daughter        medication induced for cancer treatments   Cancer Daughter        breast, brain    Social History   Socioeconomic History   Marital status: Divorced    Spouse name: Not on file   Number of children: Not on file   Years of education: Not on file   Highest education level: Not on file  Occupational History   Not on file  Tobacco Use   Smoking status: Every Day    Current packs/day: 1.00    Average packs/day: 1 pack/day for 61.5 years (61.5 ttl pk-yrs)    Types: Cigarettes    Start date: 1964   Smokeless tobacco: Never   Tobacco comments:    DOWN TO 1/2 PPD  Vaping Use   Vaping status: Never Used  Substance and Sexual Activity   Alcohol use: Yes    Alcohol/week: 1.0 standard drink of alcohol    Types: 1 Cans of beer per week    Comment: occasionally   Drug use: No   Sexual activity: Not Currently  Other Topics Concern  Not on file  Social History Narrative   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  Denies any N/V, chest pain, shortness of breath, fevers/chills. All other ROS negative.  Vital Signs: BP 97/71   Pulse 88   Temp 98.5 F (36.9 C) (Oral)   Resp (!) 24   Ht 5' 11 (1.803 m)   Wt 213 lb 10 oz (96.9 kg)   SpO2 94%   BMI 29.79 kg/m    Physical Exam Vitals reviewed.  Constitutional:      Appearance: Normal appearance.  HENT:     Head: Normocephalic and atraumatic.     Mouth/Throat:     Mouth: Mucous membranes are moist.     Pharynx: Oropharynx is clear.   Cardiovascular:     Rate and Rhythm: Normal rate and regular rhythm.     Heart sounds: Normal heart sounds.  Pulmonary:     Effort: Pulmonary effort is normal.     Breath sounds: Normal breath sounds.  Abdominal:     General: Abdomen is flat.     Palpations: Abdomen is soft.     Tenderness: There is no abdominal tenderness.   Musculoskeletal:        General: Normal range of motion.     Cervical back: Normal range of motion.   Skin:    General: Skin is warm and dry.   Neurological:     General: No focal deficit present.     Mental Status: He is alert and oriented to person, place, and time. Mental status is at baseline.   Psychiatric:        Mood and Affect: Mood normal.        Behavior: Behavior normal.        Judgment: Judgment normal.     Imaging: No results found.  Labs:  CBC: Recent Labs     12/16/22 1405 04/18/23 1404 10/26/23 1037 11/23/23 0755  WBC 8.1 8.1 7.0 9.0  HGB 14.7 15.4 14.9 16.0  HCT 46.3 47.4 46.8 50.7  PLT 189 221 165 243    COAGS: Recent Labs    11/23/23 0755  INR 1.1    BMP: Recent Labs    12/16/22 1405 01/14/23 1039 04/18/23 1404 07/27/23 0940 10/20/23 1112 10/26/23 1037  NA 140   < > 140 142 139 134*  K 4.4   < > 4.3 4.7 5.6* 4.7  CL 110   < > 109 104 104 106  CO2 20*   < > 22 20 17* 21*  GLUCOSE 97   < > 95 99 94 92  BUN 31*   < > 9 18 20  27*  CALCIUM  9.0   < > 8.9 9.4 9.6 8.7*  CREATININE 1.59*   < > 1.48* 1.82* 1.85* 1.95*  GFRNONAA 44*  --  47*  --   --  34*   < > = values in this interval not displayed.    LIVER FUNCTION TESTS: Recent Labs    04/18/23 1404 07/27/23 0940 10/20/23 1112 10/26/23 1037  BILITOT 1.0 0.4 0.5 0.8  AST 14* 15 18 18   ALT 11 16 15 14   ALKPHOS 66 87 89 65  PROT 7.6 8.0 7.8 7.9  ALBUMIN  3.7 4.2 4.0 3.7    TUMOR MARKERS: No results for input(s): AFPTM, CEA, CA199, CHROMGRNA in the last 8760 hours.  Assessment and Plan:  Multiple Myeloma Omar [name] is a 83 y.o. male with a history of smoldering myeloma who presents to [facility] Interventional Radiology department for an image-guided bone marrow biopsy with aspiration with Dr. Enos Harts on 11/23/23. Procedure to be performed under moderate sedation.  Risks and benefits of bone marrow biopsy was discussed with the patient and/or patient's family including, but not limited to bleeding, infection, damage to adjacent structures or low yield requiring additional tests.  All of the questions were answered and there is agreement to proceed.  Consent signed and in chart.   Thank you for this interesting consult. I greatly enjoyed meeting CANDY ZIEGLER and look forward to participating in their care. A copy of this report was sent to the requesting provider on this date.  Electronically Signed: Yahira Timberman M Criselda Starke, PA-C 11/23/2023, 8:20  AM   I spent a total of 30 Minutes in face to face clinical consultation, greater than 50% of which was counseling/coordinating care for bone marrow biopsy with aspiration.

## 2023-11-25 ENCOUNTER — Encounter: Payer: Self-pay | Admitting: Internal Medicine

## 2023-11-25 ENCOUNTER — Ambulatory Visit: Admitting: Internal Medicine

## 2023-11-25 VITALS — BP 99/64 | HR 57 | Temp 98.2°F | Ht 71.0 in | Wt 213.8 lb

## 2023-11-25 DIAGNOSIS — N1832 Chronic kidney disease, stage 3b: Secondary | ICD-10-CM

## 2023-11-25 DIAGNOSIS — E119 Type 2 diabetes mellitus without complications: Secondary | ICD-10-CM | POA: Diagnosis not present

## 2023-11-25 DIAGNOSIS — E782 Mixed hyperlipidemia: Secondary | ICD-10-CM

## 2023-11-25 DIAGNOSIS — Z0130 Encounter for examination of blood pressure without abnormal findings: Secondary | ICD-10-CM

## 2023-11-25 LAB — SURGICAL PATHOLOGY

## 2023-11-25 LAB — POCT CBG (FASTING - GLUCOSE)-MANUAL ENTRY: Glucose Fasting, POC: 101 mg/dL — AB (ref 70–99)

## 2023-11-25 NOTE — Progress Notes (Signed)
 Established Patient Office Visit  Subjective:  Patient ID: Omar [name], male    DOB: [birth year]  Age: 83 y.o. MRN: [id number]  No chief complaint on file.   No new complaints and states dizziness has improved but still admits to poor fluid intake.    No other concerns at this time.   Past Medical History:  Diagnosis Date   AAA (abdominal aortic aneurysm) (HCC)    a.) Stephenson Cichy/p EVAR 11/05/2013.   Anxiety    a.) on BZO (alprazolam ) PRN   Aortic atherosclerosis (HCC)    Atrial fibrillation (HCC)    a.) CHA2DS2-VASc = 6 (age x2, CHF, HTN, PVD/aortic plaque, T2DM). b.) rate/rhythm maintained without use of pharmacological intervention; chronically anticoagulated using dose reduced apixaban.   BPH (benign prostatic hyperplasia)    CHF (congestive heart failure) (HCC)    a.)  TTE 03/16/2021: EF 63%; moderate LVH; mild BAE mild to moderate pan valvular regurgitation; G1DD.   CKD (chronic kidney disease), stage III (HCC)    Colon polyp 07/07/2015   TUBULAR ADENOMA WITH AT LEAST HIGH-GRADE / Dr Leatrice Provost   COPD (chronic obstructive pulmonary disease) (HCC)    Coronary artery disease    a.) coronary CTA 01/10/2015 CA score 722.3; mod-sev LAD disease; mild LCx disease; 50-60% RCA disease.   Current use of long term anticoagulation    a.) dose reduced apixaban   Diverticulosis    Erectile dysfunction    GERD (gastroesophageal reflux disease)    Gout    Grade I diastolic dysfunction    Heart murmur    Hepatic cyst    Hepatic steatosis    Hepatitis    Hypercholesterolemia    Hypertension    Iron  deficiency anemia    Migraines    Moderate tricuspid regurgitation by prior echocardiogram    Multiple myeloma (HCC)    Nephrolithiasis    OSA on CPAP    Presence of permanent cardiac pacemaker    Prostate cancer (HCC) 01/01/13, 01/30/14   Gleason 3+4=7, volume 46.6 cc   PVD (peripheral vascular disease) (HCC)    Renal cyst    Rheumatoid arthritis (HCC)    Alilah Mcmeans/P radiation therapy  04/03/2014  through 06/04/2014                                                      Prostate 7800 cGy in 40 sessions                           SSS (sick sinus syndrome) (HCC)    a.) Fredrik Mogel/p Medtronic Azure PPM placement 08/26/2016   T2DM (type 2 diabetes mellitus) (HCC)    Third degree heart block (HCC) 08/26/2016   a.) Brigetta Beckstrom/p PPM placement (Medtronic Azure XT DR MRI [id number] Niki Payment/N: [id number]) on 08/26/2016   Vitamin D deficiency     Past Surgical History:  Procedure Laterality Date   ABDOMINAL AORTIC ANEURYSM REPAIR  11/05/2013   CATARACT EXTRACTION W/PHACO Left 06/22/2023   Procedure: CATARACT EXTRACTION PHACO AND INTRAOCULAR LENS PLACEMENT (IOC) LEFT  CLAREON VIVITY TORIC  17.82  01:15.5;  Surgeon: Annell Kidney, MD;  Location: [facility];  Service: Ophthalmology;  Laterality: Left;   CATARACT EXTRACTION W/PHACO Right 07/06/2023   Procedure: CATARACT EXTRACTION PHACO AND INTRAOCULAR LENS PLACEMENT (IOC) RIGHT DIABETIC  CLAREON VIVITY  TORIC;  Surgeon: Annell Kidney, MD;  Location: [facility];  Service: Ophthalmology;  Laterality: Right;  17.01 1:01.9   COLON SURGERY  08/06/2015   Right hemicolectomy for tubulovillous adenoma with high-grade dysplasia.   COLONOSCOPY WITH PROPOFOL  N/A 07/07/2015   Procedure: COLONOSCOPY WITH PROPOFOL ;  Surgeon: Luella Sager, MD;  Location: [facility];  Service: Endoscopy;  Laterality: N/A;   COLONOSCOPY WITH PROPOFOL  N/A 10/28/2016   Procedure: COLONOSCOPY WITH PROPOFOL ;  Surgeon: Luke Salaam, MD;  Location: [facility] ENDOSCOPY;  Service: Endoscopy;  Laterality: N/A;   CYSTOSCOPY W/ RETROGRADES Bilateral 11/16/2021   Procedure: CYSTOSCOPY WITH RETROGRADE PYELOGRAM;  Surgeon: Dustin Gimenez, MD;  Location: [facility];  Service: Urology;  Laterality: Bilateral;   CYSTOSCOPY WITH BIOPSY N/A 11/16/2021   Procedure: CYSTOSCOPY WITH BLADDER BIOPSY;  Surgeon: Dustin Gimenez, MD;  Location: [facility];  Service: Urology;  Laterality: N/A;    ESOPHAGOGASTRODUODENOSCOPY (EGD) WITH PROPOFOL  N/A 10/28/2016   Procedure: ESOPHAGOGASTRODUODENOSCOPY (EGD) WITH PROPOFOL ;  Surgeon: Luke Salaam, MD;  Location: [facility] ENDOSCOPY;  Service: Endoscopy;  Laterality: N/A;   GIVENS CAPSULE STUDY N/A 07/25/2017   Procedure: GIVENS CAPSULE STUDY;  Surgeon: Luke Salaam, MD;  Location: [facility] ENDOSCOPY;  Service: Gastroenterology;  Laterality: N/A;   GIVENS CAPSULE STUDY N/A 01/29/2019   Procedure: GIVENS CAPSULE STUDY;  Surgeon: Luke Salaam, MD;  Location: [facility] ENDOSCOPY;  Service: Gastroenterology;  Laterality: N/A;   IR BONE MARROW BIOPSY & ASPIRATION  11/23/2023   LAPAROSCOPIC RIGHT COLECTOMY Right 08/08/2015   Procedure: LAPAROSCOPIC RIGHT COLECTOMY;  Surgeon: Marshall Skeeter, MD;  Location: [facility];  Service: General;  Laterality: Right;   NASAL SINUS SURGERY     PACEMAKER INSERTION Left 08/26/2016   Procedure: INSERTION PACEMAKER;  Surgeon: Percival Brace, MD;  Location: [facility];  Service: Cardiovascular;  Laterality: Left;   PROSTATE BIOPSY  01/01/13, 01/30/14   Gleason 3+3=6, vol 46.6 cc   TONSILLECTOMY     uvula surgery     for sleep apnea    Social History   Socioeconomic History   Marital status: Divorced    Spouse name: Not on file   Number of children: Not on file   Years of education: Not on file   Highest education level: Not on file  Occupational History   Not on file  Tobacco Use   Smoking status: Every Day    Current packs/day: 1.00    Average packs/day: 1 pack/day for 61.5 years (61.5 ttl pk-yrs)    Types: Cigarettes    Start date: 1964   Smokeless tobacco: Never   Tobacco comments:    DOWN TO 1/2 PPD  Vaping Use   Vaping status: Never Used  Substance and Sexual Activity   Alcohol use: Yes    Alcohol/week: 1.0 standard drink of alcohol    Types: 1 Cans of beer per week    Comment: occasionally   Drug use: No   Sexual activity: Not Currently  Other Topics Concern   Not on file  Social History Narrative    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   Social Drivers of Health   Financial Resource Strain: Not on file  Food Insecurity: No Food Insecurity (09/14/2022)   Hunger Vital Sign    Worried About Running Out of Food in the Last Year: Never true    Ran Out of Food in the Last Year: Never true  Transportation Needs: No Transportation Needs (09/14/2022)   PRAPARE - Administrator, Civil Service (Medical): No  Lack of Transportation (Non-Medical): No  Physical Activity: Not on file  Stress: Not on file  Social Connections: Not on file  Intimate Partner Violence: Not on file    Family History  Problem Relation Age of Onset   Heart attack Mother    Cirrhosis Father    Alzheimer'Emberlin Verner disease Sister    Pancreatic cancer Brother    Diabetes Brother    Diabetes Daughter        medication induced for cancer treatments   Cancer Daughter        breast, brain    No Known Allergies  Outpatient Medications Prior to Visit  Medication Sig   albuterol  (PROVENTIL  HFA;VENTOLIN  HFA) 108 (90 Base) MCG/ACT inhaler Inhale 1 puff into the lungs every 6 (six) hours as needed.   allopurinol  (ZYLOPRIM ) 100 MG tablet Take 1 tablet (100 mg total) by mouth daily.   ALPRAZolam  (XANAX ) 0.25 MG tablet Take by mouth 2 (two) times daily as needed.   atorvastatin  (LIPITOR) 80 MG tablet Take 1 tablet (80 mg total) by mouth at bedtime.   Azelastine  HCl 137 MCG/SPRAY SOLN Place 1 spray into the nose daily.   BREO ELLIPTA  100-25 MCG/ACT AEPB Inhale 1 puff by mouth once daily   cetirizine  (ZYRTEC ) 10 MG tablet Take 1 tablet by mouth once daily   Cholecalciferol (VITAMIN D3) 1.25 MG (50000 UT) CAPS Take 1 capsule by mouth once a week   ELIQUIS 2.5 MG TABS tablet Take 1 tablet by mouth twice daily   empagliflozin  (JARDIANCE ) 10 MG TABS tablet Take 1 tablet (10 mg total) by mouth daily before breakfast.   ezetimibe (ZETIA) 10 MG tablet Take 1 tablet by mouth once daily   Finerenone  (KERENDIA ) 10 MG TABS Take 1  tablet (10 mg total) by mouth daily.   fluticasone  (FLONASE ) 50 MCG/ACT nasal spray Place 1 spray into both nostrils daily as needed for rhinitis.   folic acid  (FOLVITE ) 1 MG tablet Take 1 mg by mouth daily.   gabapentin  (NEURONTIN ) 400 MG capsule Take 1 capsule (400 mg total) by mouth 3 (three) times daily.   HUMIRA PEN 40 MG/0.4ML PNKT Inject 40 mg into the skin every 14 (fourteen) days.   methotrexate  (RHEUMATREX) 2.5 MG tablet Take 10 mg by mouth once a week.   metoprolol succinate (TOPROL-XL) 25 MG 24 hr tablet Take 1 tablet by mouth once daily   mupirocin  ointment (BACTROBAN ) 2 % APPLY TO RIGHT GREAT TOE ONCE DAILY UNTIL HEALED   pantoprazole  (PROTONIX ) 20 MG tablet Take 1 tablet by mouth once daily   tamsulosin  (FLOMAX ) 0.4 MG CAPS capsule Take 1 capsule (0.4 mg total) by mouth daily.   traMADol -acetaminophen  (ULTRACET ) 37.5-325 MG tablet TAKE 1 TABLET BY MOUTH EVERY 6 HOURS AS NEEDED FOR PAIN   triamcinolone  cream (KENALOG ) 0.1 % Apply 1 Application topically 2 (two) times daily. to affected area   No facility-administered medications prior to visit.    Review of Systems  HENT:         Nasal discharge  Eyes: Negative.   Cardiovascular: Negative.   Gastrointestinal: Negative.   Genitourinary: Negative.   Musculoskeletal:  Positive for back pain.  Skin: Negative.   Neurological: Negative.   Endo/Heme/Allergies:  Positive for environmental allergies.  Psychiatric/Behavioral: Negative.         Objective:   BP 99/64   Pulse (!) 57   Temp 98.2 F (36.8 C)   Ht 5' 11 (1.803 m)   Wt 213 lb 12.8 oz (97  kg)   SpO2 96%   BMI 29.82 kg/m   Vitals:   11/25/23 1121  BP: 99/64  Pulse: (!) 57  Temp: 98.2 F (36.8 C)  Height: 5' 11 (1.803 m)  Weight: 213 lb 12.8 oz (97 kg)  SpO2: 96%  BMI (Calculated): 29.83    Physical Exam Vitals reviewed.  Constitutional:      Appearance: Normal appearance.  HENT:     Head: Normocephalic.     Left Ear: There is no impacted  cerumen.     Nose: Nose normal.     Mouth/Throat:     Mouth: Mucous membranes are moist.     Pharynx: No posterior oropharyngeal erythema.   Eyes:     Extraocular Movements: Extraocular movements intact.     Pupils: Pupils are equal, round, and reactive to light.    Cardiovascular:     Rate and Rhythm: Regular rhythm.     Chest Wall: PMI is not displaced.     Pulses: Normal pulses.     Heart sounds: Normal heart sounds. No murmur heard. Pulmonary:     Effort: Pulmonary effort is normal.     Breath sounds: Normal air entry. Rales (scanty basal) present. No rhonchi.  Abdominal:     General: Abdomen is flat. Bowel sounds are normal. There is no distension.     Palpations: Abdomen is soft. There is no hepatomegaly, splenomegaly or mass.     Tenderness: There is no abdominal tenderness.   Musculoskeletal:        General: Normal range of motion.     Cervical back: Normal range of motion and neck supple.     Right lower leg: No edema.     Left lower leg: No edema.   Skin:    General: Skin is warm and dry.   Neurological:     General: No focal deficit present.     Mental Status: He is alert and oriented to person, place, and time.     Cranial Nerves: No cranial nerve deficit.     Motor: No weakness.   Psychiatric:        Mood and Affect: Mood normal.        Behavior: Behavior normal.      Results for orders placed or performed in visit on 11/25/23  POCT CBG (Fasting - Glucose)  Result Value Ref Range   Glucose Fasting, POC 101 (A) 70 - 99 mg/dL        Assessment & Plan:  As per problem list. Increase fluid intake. Problem List Items Addressed This Visit       Endocrine   Diabetes mellitus (HCC) - Primary   Relevant Orders   POCT CBG (Fasting - Glucose) (Completed)     Other   HLD (hyperlipidemia)   Other Visit Diagnoses       CKD stage 3b, GFR 30-44 ml/min (HCC)       Relevant Orders   Comprehensive metabolic panel with GFR       Return in about 2  months (around 01/25/2024) for fu with labs prior.   Total time spent: 20 minutes  Arzella Bitters, MD  11/25/2023   This document may have been prepared by [facility] Voice Recognition software and as such may include unintentional dictation errors.

## 2023-11-27 ENCOUNTER — Other Ambulatory Visit: Payer: Self-pay | Admitting: Cardiovascular Disease

## 2023-12-05 ENCOUNTER — Encounter (HOSPITAL_COMMUNITY): Payer: Self-pay | Admitting: Oncology

## 2023-12-05 ENCOUNTER — Other Ambulatory Visit: Payer: Self-pay | Admitting: Internal Medicine

## 2023-12-06 ENCOUNTER — Encounter: Payer: Self-pay | Admitting: Oncology

## 2023-12-06 ENCOUNTER — Inpatient Hospital Stay: Attending: Oncology | Admitting: Oncology

## 2023-12-06 VITALS — BP 99/71 | HR 100 | Temp 95.2°F | Resp 18 | Wt 214.3 lb

## 2023-12-06 DIAGNOSIS — R911 Solitary pulmonary nodule: Secondary | ICD-10-CM | POA: Insufficient documentation

## 2023-12-06 DIAGNOSIS — N183 Chronic kidney disease, stage 3 unspecified: Secondary | ICD-10-CM | POA: Insufficient documentation

## 2023-12-06 DIAGNOSIS — F17210 Nicotine dependence, cigarettes, uncomplicated: Secondary | ICD-10-CM | POA: Diagnosis not present

## 2023-12-06 DIAGNOSIS — Z800 Family history of malignant neoplasm of digestive organs: Secondary | ICD-10-CM | POA: Diagnosis not present

## 2023-12-06 DIAGNOSIS — C61 Malignant neoplasm of prostate: Secondary | ICD-10-CM

## 2023-12-06 DIAGNOSIS — F17200 Nicotine dependence, unspecified, uncomplicated: Secondary | ICD-10-CM

## 2023-12-06 DIAGNOSIS — Z8546 Personal history of malignant neoplasm of prostate: Secondary | ICD-10-CM | POA: Diagnosis not present

## 2023-12-06 DIAGNOSIS — D509 Iron deficiency anemia, unspecified: Secondary | ICD-10-CM | POA: Diagnosis not present

## 2023-12-06 DIAGNOSIS — M069 Rheumatoid arthritis, unspecified: Secondary | ICD-10-CM | POA: Insufficient documentation

## 2023-12-06 DIAGNOSIS — R5383 Other fatigue: Secondary | ICD-10-CM | POA: Insufficient documentation

## 2023-12-06 DIAGNOSIS — D472 Monoclonal gammopathy: Secondary | ICD-10-CM | POA: Insufficient documentation

## 2023-12-06 DIAGNOSIS — G4733 Obstructive sleep apnea (adult) (pediatric): Secondary | ICD-10-CM | POA: Insufficient documentation

## 2023-12-06 NOTE — Assessment & Plan Note (Addendum)
 Smoldering kappa light chain multiple myeloma/active myeloma if previous hypermetabolic lesion on PET scan was related to myeloma.Patient was treated with 4 cycles of RVD.  He is not currently on any maintenance therapy due to multiple comorbidities, borderline smoldering multiple myeloma status.    Lab Results  Component Value Date   MPROTEIN Not Observed 10/26/2023   KPAFRELGTCHN 224.2 (H) 10/26/2023   LAMBDASER 35.4 (H) 10/26/2023   KAPLAMBRATIO 6.33 (H) 10/26/2023    His kidney function is getting worse, no anemia.  No hypercalcemia. slow progressing light chain ratio. 24 hour UPEP M protein 26mg , negative skeletal survey 11/23/2023 bone marrow biopsy showed 30% cellularity with no increase of plasma cells. Kappa/lambda ISH is polyclonal.  Cytogenetics normal. Continue observation.

## 2023-12-06 NOTE — Assessment & Plan Note (Signed)
 Repeat CT chest in Sept 2024 showed stable right upper lobe and right lower lobe lung nodules Repeat CT in Sept 2025

## 2023-12-06 NOTE — Progress Notes (Signed)
 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  He is not currently on any maintenance therapy due to multiple comorbidities, borderline smoldering multiple myeloma status.    Lab Results  Component Value Date   MPROTEIN Not Observed 10/26/2023   KPAFRELGTCHN 224.2 (H) 10/26/2023   LAMBDASER 35.4 (H) 10/26/2023   KAPLAMBRATIO 6.33 (H) 10/26/2023    His kidney function is getting worse, no anemia.  No hypercalcemia. slow progressing light chain ratio. 24 hour UPEP M protein 26mg , negative skeletal survey 11/23/2023 bone marrow biopsy showed 30% cellularity with no increase of plasma cells. Kappa/lambda ISH is polyclonal.  Cytogenetics normal. Continue observation.  Tobacco use disorder Encourage his smoke cessation effort.  Erythrocytosis has resolved.   Malignant neoplasm of prostate (HCC) PSA is stable.    Lung nodule Repeat CT chest in Sept 2024 showed stable right upper lobe and right lower lobe lung nodules Repeat CT in Sept 2025  Orders Placed This Encounter  Procedures   CBC with Differential (Cancer Center Only)    Standing Status:   Future    Expected Date:   06/07/2024    Expiration Date:   09/05/2024   CMP (Cancer Center only)    Standing Status:   Future    Expected Date:   06/07/2024    Expiration Date:   09/05/2024   Multiple Myeloma Panel (SPEP&IFE w/QIG)    Standing Status:   Future    Expected Date:   06/07/2024    Expiration Date:   09/05/2024   Kappa/lambda light chains    Standing Status:   Future    Expected Date:   06/07/2024    Expiration Date:   09/05/2024   Beta 2 microglobulin, serum    Standing Status:   Future    Expected Date:   06/07/2024     Expiration Date:   09/05/2024   IFE+PROTEIN ELECTRO, 24-HR UR    Standing Status:   Future    Expected Date:   06/07/2024    Expiration Date:   09/05/2024   Follow up 6 months.  All questions were answered. The patient knows to call the clinic with any problems, questions or concerns.  Zelphia Cap, MD, PhD [facility] Hematology Oncology 12/06/2023     HISTORY OF PRESENTING ILLNESS:  83 y.o.  male with PMH listed below who presents to follow up on the evaluation and management of his abnormal urine protein electrophoresis results. I reviewed the records from [facility], [facility] and [facility] was performed and HemOnc related medical problems are listed below.  He lives with a friend. He has 3 adult children.   1 Chronic Kidney disease, Stage III: patient follows up with Dr.Lateef.  2 Proteinuria:  02/18/2017: Albumin /Creatinine ratio was 7, Urine protein electrophoresis,Random Urine revealed M Spike 43.2%, total protein of 43.7mg /dl,  06/15/7979 Albumin /Creatinine ratio was 455.2, Albumin  996.5, , 2.15.2018 Albumin /Creatinine ratio was 80.3, Albumin  73.3, Urine protein electrophoresis,Random Urine revealed M Spike 29.7%, total protein of 29.1mg /dl, 8/75/7981 Serum protein electrophoresis did not detect M spike.  Autoimmune disorder work up showed Negative anti dsDNA, RNP antibodies, smith antibody, Sjogren antibodies, anti Jo-1, positive antichromotin antibodies, positive ANA,  3 Anemia of chronic kidney disease:  4 Iron  deficiency anemia: history of iron  deficiency, ferritin was 7 on 08/11/2016. He had EGD and colonoscopy that were done this year which showed duodenitis/esphagitis/diverticulosis/benigh polyps. # small bowel AVMs was ablated at [facility].    Rheumatoid arthritis: he follows up with Rheumatology and is on chronic MTX and Humaria  reports joint pains are controlled, not quite symptomatic.   Patient denies any persistent bone pain or any pain. He  continue to feel lack of energy, persistent, not improved with resting or taking naps. He also has lost 25 pounds in the past year which was unintentional.    # It is ambiguous whether he has truly symptomatic multiple myeloma or a smoldering myeloma, given that anemia can be secondary to iron  deficiency and CKD.  The hypermetabolic 2.1 cm hypermetabolic soft tissue lesion in the left heel, can reflect a Plasmacytoma,which should have been biopsied. However patient initially denies being on any blood thinner, later he was found to be on Plavix with pre biopsy questionnaire screening, and biopsy was held. Patient has to contact cardiologist to hold plavix for seven days before procedure. He feels frustrated about multiple workup and meanwhile he becomes more fatigues, with worsening of kidney function. Patient was reluctant about proceeding additional invasive procedures and wants to start treatment. Decision was made to start active MM treatment.   # He was evaluated by [facility] bone marrow transplant team for autologous bone marrow transplant. He is not considered to  good candidate for transplant. Patient cardiologist Dr. Deretha had started patient on Lasix  20 mg daily. Lasix  was discontinued by [facility] bone marrow transplant team as patient was having dizziness and borderline blood pressure during his clinic visit there.  10/02/2018 He had follow-up left foot/ankle x-ray as a follow-up of his left heel lesion which was initially presented on PET scan and not detectable on follow-up PET scan.  Images were reviewed by me.  No suspicious acute or subacute osseous abnormality  Current Treatment:  S/p RVD x 4 cycles tolerates well except cytopenia.  Revlimid  10 mg PO once per day on days 1 to 14 (dose reduced due to GFR) Revlimid  renal dosing of 10mg ,  Velcade  1.3 mg/m2 IV once per day on days 1,  8, 15 Dexamethasone  20 mg PO once per day on days 1, 8, 15 given patient's diabetes Given patient's multiple  comorbidity and the side effects of cytopenia during his RVD treatment and the uncertainty of this is really a site of plasmacytoma or not, I recommend not to continue him on maintenance Revlimid .   # repeat small bowel capsule study on 02/16/2019 Which showed multiple AVMs seen in the mid jejunum proximal to the site of prior tattoo. # Patient was referred to [facility] and he is status post 06/19/2019 double-balloon enteroscopy with findings of 5 AVMs in the jejunum and 2 AVMs in the duodenum and AVMs were ablated with APC.  Tattoo was seen in the area. If further bleeding, recommend to consider treatment with octreotide. Patient takes Protonix  daily.    INTERVAL HISTORY Patient presents for follow-up of management of multiple myeloma/smoldering multiple myeloma and iron  deficiency, secondary erythrocytosis. Patient continues to smoke.  RA, he is on Humaria and methotrexate .  Patient has sleep apnea, he uses CPAP machine as much as he can, approximately 2-3 times per week. He has no new complaints.  11/23/2023, pulmonary biopsy showed normocellular bone marrow 30% with trilineage hematopoiesis and no increase in plasma cells.  Cytogenetics normal.  Review of Systems  Constitutional:  Positive for fatigue. Negative for appetite change, chills, fever and unexpected weight change.  HENT:   Negative for hearing loss and voice change.   Eyes:  Negative for eye problems and icterus.  Respiratory:  Negative for chest tightness, cough and shortness of breath.   Cardiovascular:  Negative for chest pain and leg swelling.  Gastrointestinal:  Negative for abdominal distention, abdominal pain and diarrhea.  Endocrine: Negative for hot flashes.  Genitourinary:  Negative for difficulty urinating, dysuria and frequency.   Musculoskeletal:  Positive for arthralgias.  Skin:  Negative for itching and rash.  Neurological:  Negative for light-headedness and numbness.  Hematological:  Negative for adenopathy. Does not  bruise/bleed easily.  Psychiatric/Behavioral:  Negative for confusion.      MEDICAL HISTORY:  Past Medical History:  Diagnosis Date   AAA (abdominal aortic aneurysm) (HCC)    a.) s/p EVAR 11/05/2013.   Anxiety    a.) on BZO (alprazolam ) PRN   Aortic atherosclerosis (HCC)    Atrial fibrillation (HCC)    a.) CHA2DS2-VASc = 6 (age x2, CHF, HTN, PVD/aortic plaque, T2DM). b.) rate/rhythm maintained without use of pharmacological intervention; chronically anticoagulated using dose reduced apixaban.   BPH (benign prostatic hyperplasia)    CHF (congestive heart failure) (HCC)    a.)  TTE 03/16/2021: EF 63%; moderate LVH; mild BAE mild to moderate pan valvular regurgitation; G1DD.   CKD (chronic kidney disease), stage III (HCC)    Colon polyp 07/07/2015   TUBULAR ADENOMA WITH AT LEAST HIGH-GRADE / Dr Jeri   COPD (chronic obstructive pulmonary disease) (HCC)    Coronary artery disease    a.) coronary CTA 01/10/2015 CA score 722.3; mod-sev LAD disease; mild LCx disease; 50-60% RCA disease.   Current use of long term anticoagulation    a.) dose reduced apixaban   Diverticulosis    Erectile dysfunction    GERD (gastroesophageal reflux disease)    Gout    Grade I diastolic dysfunction    Heart murmur    Hepatic cyst    Hepatic steatosis    Hepatitis    Hypercholesterolemia    Hypertension    Iron  deficiency anemia    Migraines    Moderate tricuspid regurgitation by prior echocardiogram    Multiple myeloma (HCC)    Nephrolithiasis    OSA on CPAP    Presence of permanent cardiac pacemaker    Prostate cancer (HCC) 01/01/13, 01/30/14   Gleason 3+4=7, volume 46.6 cc   PVD (peripheral vascular disease) (HCC)    Renal cyst    Rheumatoid arthritis (HCC)    S/P radiation therapy  04/03/2014 through 06/04/2014                                                      Prostate 7800 cGy in 40 sessions                           SSS (sick sinus syndrome) (HCC)    a.) s/p Medtronic Azure PPM placement  08/26/2016   T2DM (type 2 diabetes mellitus) (HCC)    Third degree heart block (HCC) 08/26/2016   a.) s/p PPM placement (Medtronic Azure XT DR MRI [id number] S/N: [id number]) on 08/26/2016   Vitamin D deficiency     SURGICAL HISTORY: Past Surgical History:  Procedure Laterality  Date   ABDOMINAL AORTIC ANEURYSM REPAIR  11/05/2013   CATARACT EXTRACTION W/PHACO Left 06/22/2023   Procedure: CATARACT EXTRACTION PHACO AND INTRAOCULAR LENS PLACEMENT (IOC) LEFT  CLAREON VIVITY TORIC  17.82  01:15.5;  Surgeon: Mittie Gaskin, MD;  Location: [facility] SUR[facility]ERY [facility];  Service: Ophthalmology;  Laterality: Left;   CATARACT EXTRACTION W/PHACO Right 07/06/2023   Procedure: CATARACT EXTRACTION PHACO AND INTRAOCULAR LENS PLACEMENT (IOC) RIGHT DIABETIC  CLAREON VIVITY TORIC;  Surgeon: Mittie Gaskin, MD;  Location: [facility]ERY [facility];  Service: Ophthalmology;  Laterality: Right;  17.01 1:01.9   COLON SURGERY  08/06/2015   Right hemicolectomy for tubulovillous adenoma with high-grade dysplasia.   COLONOSCOPY WITH PROPOFOL  N/A 07/07/2015   Procedure: COLONOSCOPY WITH PROPOFOL ;  Surgeon: Donnice Vaughn Manes, MD;  Location: [facility] ENDOSCOPY;  Service: Endoscopy;  Laterality: N/A;   COLONOSCOPY WITH PROPOFOL  N/A 10/28/2016   Procedure: COLONOSCOPY WITH PROPOFOL ;  Surgeon: Therisa Bi, MD;  Location[facility] ENDOSCOPY;  Service: Endoscopy;  Laterality: N/A;   CYSTOSCOPY W/ RETROGRADES Bilateral 11/16/2021   Procedure: CYSTOSCOPY WITH RETROGRADE PYELOGRAM;  Surgeon: Penne Knee, MD;  Location: [facility] ORS;  Service: Urology;  Laterality: Bilateral;   CYSTOSCOPY WITH BIOPSY N/A 11/16/2021   Procedure: CYSTOSCOPY WITH BLADDER BIOPSY;  Surgeon: Penne Knee, MD;  Location: [facility] ORS;  Service: Urology;  Laterality: N/A;   ESOPHAGOGASTRODUODENOSCOPY (EGD) WITH PROPOFOL  N/A 10/28/2016   Procedure: ESOPHAGOGASTRODUODENOSCOPY (EGD) WITH PROPOFOL ;  Surgeon: Therisa Bi, MD;  Location: [facility] ENDOSCOPY;  Service: Endoscopy;   Laterality: N/A;   GIVENS CAPSULE STUDY N/A 07/25/2017   Procedure: GIVENS CAPSULE STUDY;  Surgeon: Therisa Bi, MD;  Location: [facility] ENDOSCOPY;  Service: Gastroenterology;  Laterality: N/A;   GIVENS CAPSULE STUDY N/A 01/29/2019   Procedure: GIVENS CAPSULE STUDY;  Surgeon: Therisa Bi, MD;  Location: [facility] ENDOSCOPY;  Service: Gastroenterology;  Laterality: N/A;   IR BONE MARROW BIOPSY & ASPIRATION  11/23/2023   LAPAROSCOPIC RIGHT COLECTOMY Right 08/08/2015   Procedure: LAPAROSCOPIC RIGHT COLECTOMY;  Surgeon: Reyes LELON Cota, MD;  Location: [facility] ORS;  Service: General;  Laterality: Right;   NASAL SINUS SURGERY     PACEMAKER INSERTION Left 08/26/2016   Procedure: INSERTION PACEMAKER;  Surgeon: Marsa Dooms, MD;  Location: [facility] ORS;  Service: Cardiovascular;  Laterality: Left;   PROSTATE BIOPSY  01/01/13, 01/30/14   Gleason 3+3=6, vol 46.6 cc   TONSILLECTOMY     uvula surgery     for sleep apnea    SOCIAL HISTORY: Social History   Socioeconomic History   Marital status: Divorced    Spouse name: Not on file   Number of children: Not on file   Years of education: Not on file   Highest education level: Not on file  Occupational History   Not on file  Tobacco Use   Smoking status: Every Day    Current packs/day: 1.00    Average packs/day: 1 pack/day for 61.5 years (61.5 ttl pk-yrs)    Types: Cigarettes    Start date: 1964   Smokeless tobacco: Never   Tobacco comments:    DOWN TO 1/2 PPD  Vaping Use   Vaping status: Never Used  Substance and Sexual Activity   Alcohol use: Yes    Alcohol/week: 1.0 standard drink of alcohol    Types: 1 Cans of beer per week    Comment: occasionally   Drug use: No   Sexual activity: Not Currently  Other Topics Concern   Not on file  Social History Narrative   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  dementia   Social Drivers of Corporate investment banker Strain: Not on file  Food Insecurity: No Food Insecurity (09/14/2022)   Hunger Vital Sign     Worried About Running Out of Food in the Last Year: Never true    Ran Out of Food in the Last Year: Never true  Transportation Needs: No Transportation Needs (09/14/2022)   PRAPARE - Administrator, Civil Service (Medical): No    Lack of Transportation (Non-Medical): No  Physical Activity: Not on file  Stress: Not on file  Social Connections: Not on file  Intimate Partner Violence: Not on file    FAMILY HISTORY: Family History  Problem Relation Age of Onset   Heart attack Mother    Cirrhosis Father    Alzheimer's disease Sister    Pancreatic cancer Brother    Diabetes Brother    Diabetes Daughter        medication induced for cancer treatments   Cancer Daughter        breast, brain    ALLERGIES:  has no known allergies.  MEDICATIONS:  Current Outpatient Medications  Medication Sig Dispense Refill   albuterol  (PROVENTIL  HFA;VENTOLIN  HFA) 108 (90 Base) MCG/ACT inhaler Inhale 1 puff into the lungs every 6 (six) hours as needed.     allopurinol  (ZYLOPRIM ) 100 MG tablet Take 1 tablet (100 mg total) by mouth daily. 90 tablet 3   ALPRAZolam  (XANAX ) 0.25 MG tablet Take by mouth 2 (two) times daily as needed.     atorvastatin  (LIPITOR) 80 MG tablet Take 1 tablet (80 mg total) by mouth at bedtime. 90 tablet 0   Azelastine  HCl 137 MCG/SPRAY SOLN Place 1 spray into the nose daily. 30 mL 2   BREO ELLIPTA  100-25 MCG/ACT AEPB Inhale 1 puff by mouth once daily 60 each 3   cetirizine  (ZYRTEC ) 10 MG tablet Take 1 tablet by mouth once daily 30 tablet 5   Cholecalciferol (VITAMIN D3) 1.25 MG (50000 UT) CAPS Take 1 capsule by mouth once a week 12 capsule 0   ELIQUIS 2.5 MG TABS tablet Take 1 tablet by mouth twice daily 60 tablet 0   empagliflozin  (JARDIANCE ) 10 MG TABS tablet Take 1 tablet (10 mg total) by mouth daily before breakfast. 90 tablet 0   ezetimibe (ZETIA) 10 MG tablet Take 1 tablet by mouth once daily 90 tablet 1   Finerenone  (KERENDIA ) 10 MG TABS Take 1 tablet (10 mg total)  by mouth daily. 30 tablet 1   fluticasone  (FLONASE ) 50 MCG/ACT nasal spray Place 1 spray into both nostrils daily as needed for rhinitis. 48 mL 0   folic acid  (FOLVITE ) 1 MG tablet Take 1 mg by mouth daily.     gabapentin  (NEURONTIN ) 400 MG capsule Take 1 capsule (400 mg total) by mouth 3 (three) times daily. 90 capsule 0   HUMIRA PEN 40 MG/0.4ML PNKT Inject 40 mg into the skin every 14 (fourteen) days.     methotrexate  (RHEUMATREX) 2.5 MG tablet Take 10 mg by mouth once a week.     metoprolol succinate (TOPROL-XL) 25 MG 24 hr tablet Take 1 tablet by mouth once daily 90 tablet 0   mupirocin  ointment (BACTROBAN ) 2 % APPLY TO RIGHT GREAT TOE ONCE DAILY UNTIL HEALED 66 g 0   pantoprazole  (PROTONIX ) 20 MG tablet Take 1 tablet by mouth once daily 90 tablet 0   tamsulosin  (FLOMAX ) 0.4 MG CAPS capsule Take 1 capsule (0.4 mg total) by mouth daily. 90 capsule 3  traMADol -acetaminophen  (ULTRACET ) 37.5-325 MG tablet TAKE 1 TABLET BY MOUTH EVERY 6 HOURS AS NEEDED FOR PAIN 30 tablet 0   triamcinolone  cream (KENALOG ) 0.1 % Apply 1 Application topically 2 (two) times daily. to affected area     No current facility-administered medications for this visit.      SABRA  PHYSICAL EXAMINATION: ECOG PERFORMANCE STATUS: 1 - Symptomatic but completely ambulatory Vitals:   12/06/23 1046  BP: 99/71  Pulse: 100  Resp: 18  Temp: (!) 95.2 F (35.1 C)  SpO2: 96%   Filed Weights   12/06/23 1046  Weight: 214 lb 4.8 oz (97.2 kg)   Physical Exam Constitutional:      General: He is not in acute distress.    Appearance: He is not diaphoretic.  HENT:     Head: Normocephalic and atraumatic.     Mouth/Throat:     Pharynx: No oropharyngeal exudate.   Eyes:     General: No scleral icterus.       Right eye: No discharge.        Left eye: No discharge.     Pupils: Pupils are equal, round, and reactive to light.   Neck:     Vascular: No JVD.   Cardiovascular:     Rate and Rhythm: Normal rate and regular  rhythm.     Heart sounds: Normal heart sounds. No murmur heard. Pulmonary:     Effort: Pulmonary effort is normal. No respiratory distress.     Breath sounds: Normal breath sounds.  Abdominal:     General: Bowel sounds are normal. There is no distension.     Palpations: Abdomen is soft. There is no mass.     Tenderness: There is no abdominal tenderness.   Musculoskeletal:        General: No tenderness. Normal range of motion.     Cervical back: Normal range of motion and neck supple.  Lymphadenopathy:     Cervical: No cervical adenopathy.   Skin:    General: Skin is warm and dry.     Findings: No erythema.     Comments: Hypopigmentation of skin   Neurological:     Mental Status: He is alert and oriented to person, place, and time.     Cranial Nerves: No cranial nerve deficit.   Psychiatric:        Mood and Affect: Affect normal.     LABORATORY DATA:  I have reviewed the data as listed    Latest Ref Rng & Units 11/23/2023    7:55 AM 10/26/2023   10:37 AM 04/18/2023    2:04 PM  CBC  WBC 4.0 - 10.5 K/uL 9.0  7.0  8.1   Hemoglobin 13.0 - 17.0 g/dL 83.9  85.0  84.5   Hematocrit 39.0 - 52.0 % 50.7  46.8  47.4   Platelets 150 - 400 K/uL 243  165  221       Latest Ref Rng & Units 10/26/2023   10:37 AM 10/20/2023   11:12 AM 07/27/2023    9:40 AM  CMP  Glucose 70 - 99 mg/dL 92  94  99   BUN 8 - 23 mg/dL 27  20  18    Creatinine 0.61 - 1.24 mg/dL 8.04  8.14  8.17   Sodium 135 - 145 mmol/L 134  139  142   Potassium 3.5 - 5.1 mmol/L 4.7  5.6  4.7   Chloride 98 - 111 mmol/L 106  104  104   CO2 22 -  32 mmol/L 21  17  20    Calcium  8.9 - 10.3 mg/dL 8.7  9.6  9.4   Total Protein 6.5 - 8.1 g/dL 7.9  7.8  8.0   Total Bilirubin 0.0 - 1.2 mg/dL 0.8  0.5  0.4   Alkaline Phos 38 - 126 U/L 65  89  87   AST 15 - 41 U/L 18  18  15    ALT 0 - 44 U/L 14  15  16       Bone marrow biopsy 03/21/2017  Bone Marrow, Aspirate,Biopsy, and Clot, left iliac - HYPERCELLULAR BONE MARROW FOR AGE  WITH TRILINEAGE HEMATOPOIESIS. - PLASMACYTOSIS (PLASMA CELLS 8%)  Karyotype: loss of Y chromosome Cytogenetic MDS FISH Panel negative.   Bone marrow 04/06/2017  Bone Marrow, Aspirate,Biopsy, and Clot, right iliac and core - HYPERCELLULAR BONE MARROW FOR AGE WITH PLASMA CELL NEOPLASM - TRILINEAGE HEMATOPOIESIS. - SEE COMMENT. PERIPHERAL BLOOD: - MICROCYTIC-HYPOCHROMIC ANEMIA. Diagnosis Note The bone marrow is hypercellular with trilineage hematopoiesis but with relative abundance of erythroid precursors and increased number of megakaryocytes with nonspecific changes. Significant dyspoiesis is not seen. Iron  stores are present with no ring sideroblasts. The plasma cells are increased in number representing 8% of all cells in the aspirate although focal areas in the core biopsy show 10 to 20% as primarily seen by CD138 stain. In situ hybridization for kappa and lambda light chains show kappa light chain restriction consistent with plasma cell neoplasm. Correlation with cytogenetic and FISH studies is recommended. (BNS:ecj/gt 04/07/2017)  IMAGE STUDIES I have personally reviewed below image results.  03/15/2017 DG bone survey Met: Nonspecific lucent lesion in the distal right ulna measuring 15-16 mm, but otherwise normal bone mineralization for age throughout the visible skeleton. A solitary lytic lesion in a distal extremity would be an unusual presentation of multiple myeloma, and I favor the distal right ulna lesion is benign. Recommend correlation with serum and urine protein electrophoresis.  04/14/2017 PET scan: 1. 2.1 cm hypermetabolic soft tissue lesion in the left heel, adjacent to the calcaneal tuberosity. 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 2. Mottled FDG accumulation diffusely in the marrow space without other frankly overt hypermetabolic bony lesion. 3. Coronary artery and thoracoabdominal aortic atherosclerosis with abdominal aortic stent graft visualized in situ. 4.  Bilateral renal cysts and bilateral nonobstructing renal stones.   08/24/2017 PET scan 1. No hypermetabolic osseous lesions. No definite signs of multiple myeloma on CT imaging. 2. Interval improvement in soft tissue activity along the medial aspect of the left calcaneal tuberosity, likely plantar fasciitis. 3. New small left pleural effusion. 4. Bilateral inguinal hernias containing small bowel on the right and sigmoid colon on the left. No evidence of incarceration or obstruction. 5. Otherwise stable incidental findings including diffuse atherosclerosis, bilateral renal cysts, emphysema and sigmoid diverticulosis.

## 2023-12-06 NOTE — Assessment & Plan Note (Signed)
Encourage his smoke cessation effort.  Erythrocytosis has resolved.

## 2023-12-06 NOTE — Assessment & Plan Note (Signed)
 PSA is stable

## 2023-12-08 ENCOUNTER — Other Ambulatory Visit: Payer: Self-pay | Admitting: Internal Medicine

## 2023-12-08 DIAGNOSIS — E119 Type 2 diabetes mellitus without complications: Secondary | ICD-10-CM

## 2023-12-14 DIAGNOSIS — E1122 Type 2 diabetes mellitus with diabetic chronic kidney disease: Secondary | ICD-10-CM | POA: Diagnosis not present

## 2023-12-14 DIAGNOSIS — E785 Hyperlipidemia, unspecified: Secondary | ICD-10-CM | POA: Diagnosis not present

## 2023-12-14 DIAGNOSIS — I129 Hypertensive chronic kidney disease with stage 1 through stage 4 chronic kidney disease, or unspecified chronic kidney disease: Secondary | ICD-10-CM | POA: Diagnosis not present

## 2023-12-14 DIAGNOSIS — N281 Cyst of kidney, acquired: Secondary | ICD-10-CM | POA: Diagnosis not present

## 2023-12-14 DIAGNOSIS — R808 Other proteinuria: Secondary | ICD-10-CM | POA: Diagnosis not present

## 2023-12-14 DIAGNOSIS — N1832 Chronic kidney disease, stage 3b: Secondary | ICD-10-CM | POA: Diagnosis not present

## 2023-12-16 ENCOUNTER — Other Ambulatory Visit: Payer: Self-pay | Admitting: Internal Medicine

## 2023-12-16 DIAGNOSIS — E1129 Type 2 diabetes mellitus with other diabetic kidney complication: Secondary | ICD-10-CM

## 2023-12-22 ENCOUNTER — Other Ambulatory Visit: Payer: Self-pay | Admitting: Cardiovascular Disease

## 2023-12-22 NOTE — Progress Notes (Signed)
   12/22/2023  Patient ID: Omar [name], male   DOB: 12/09/1940, 83 y.o.   MRN: [id number]  Pharmacy Quality Measure Review  This patient is appearing on a report for being at risk of failing the adherence measure for hypertension (ACEi/ARB) medications this calendar year.   Medication: Benazepril   Last fill date: 07/29/23 for 90 day supply  Medication has been discontinued. No further action needed at this time.  Jon VEAR Lindau, PharmD Clinical Pharmacist [phone]

## 2024-01-01 ENCOUNTER — Other Ambulatory Visit: Payer: Self-pay | Admitting: Internal Medicine

## 2024-01-05 ENCOUNTER — Encounter: Payer: Self-pay | Admitting: Podiatry

## 2024-01-05 ENCOUNTER — Ambulatory Visit (INDEPENDENT_AMBULATORY_CARE_PROVIDER_SITE_OTHER): Admitting: Podiatry

## 2024-01-05 DIAGNOSIS — M2042 Other hammer toe(s) (acquired), left foot: Secondary | ICD-10-CM | POA: Diagnosis not present

## 2024-01-05 DIAGNOSIS — E1151 Type 2 diabetes mellitus with diabetic peripheral angiopathy without gangrene: Secondary | ICD-10-CM

## 2024-01-05 DIAGNOSIS — M2041 Other hammer toe(s) (acquired), right foot: Secondary | ICD-10-CM | POA: Diagnosis not present

## 2024-01-05 DIAGNOSIS — M79609 Pain in unspecified limb: Secondary | ICD-10-CM | POA: Diagnosis not present

## 2024-01-05 DIAGNOSIS — M2012 Hallux valgus (acquired), left foot: Secondary | ICD-10-CM

## 2024-01-05 DIAGNOSIS — E119 Type 2 diabetes mellitus without complications: Secondary | ICD-10-CM | POA: Diagnosis not present

## 2024-01-05 DIAGNOSIS — B351 Tinea unguium: Secondary | ICD-10-CM | POA: Diagnosis not present

## 2024-01-05 DIAGNOSIS — M2011 Hallux valgus (acquired), right foot: Secondary | ICD-10-CM

## 2024-01-05 NOTE — Progress Notes (Signed)
 ANNUAL DIABETIC FOOT EXAM  Subjective: Omar [name] presents today for annual diabetic foot exam.  Chief Complaint  Patient presents with   [facility]    Rm3 Diabetic foot care /Dr Albina last visit June 2025/ A1c 6    Patient confirms h/o diabetes.  Patient denies any h/o foot wounds.  Patient has been diagnosed with neuropathy.  Tejan-Sie, S Ahmed, MD is patient's PCP.  Past Medical History:  Diagnosis Date   AAA (abdominal aortic aneurysm) (HCC)    a.) s/p EVAR 11/05/2013.   Anxiety    a.) on BZO (alprazolam ) PRN   Aortic atherosclerosis (HCC)    Atrial fibrillation (HCC)    a.) CHA2DS2-VASc = 6 (age x2, CHF, HTN, PVD/aortic plaque, T2DM). b.) rate/rhythm maintained without use of pharmacological intervention; chronically anticoagulated using dose reduced apixaban.   BPH (benign prostatic hyperplasia)    CHF (congestive heart failure) (HCC)    a.)  TTE 03/16/2021: EF 63%; moderate LVH; mild BAE mild to moderate pan valvular regurgitation; G1DD.   CKD (chronic kidney disease), stage III (HCC)    Colon polyp 07/07/2015   TUBULAR ADENOMA WITH AT LEAST HIGH-GRADE / Dr Jeri   COPD (chronic obstructive pulmonary disease) (HCC)    Coronary artery disease    a.) coronary CTA 01/10/2015 CA score 722.3; mod-sev LAD disease; mild LCx disease; 50-60% RCA disease.   Current use of long term anticoagulation    a.) dose reduced apixaban   Diverticulosis    Erectile dysfunction    GERD (gastroesophageal reflux disease)    Gout    Grade I diastolic dysfunction    Heart murmur    Hepatic cyst    Hepatic steatosis    Hepatitis    Hypercholesterolemia    Hypertension    Iron  deficiency anemia    Migraines    Moderate tricuspid regurgitation by prior echocardiogram    Multiple myeloma (HCC)    Nephrolithiasis    OSA on CPAP    Presence of permanent cardiac pacemaker    Prostate cancer (HCC) 01/01/13, 01/30/14   Gleason 3+4=7, volume 46.6 cc   PVD (peripheral vascular  disease) (HCC)    Renal cyst    Rheumatoid arthritis (HCC)    S/P radiation therapy  04/03/2014 through 06/04/2014                                                      Prostate 7800 cGy in 40 sessions                           SSS (sick sinus syndrome) (HCC)    a.) s/p Medtronic Azure PPM placement 08/26/2016   T2DM (type 2 diabetes mellitus) (HCC)    Third degree heart block (HCC) 08/26/2016   a.) s/p PPM placement (Medtronic Azure XT DR MRI [id number] S/N: [id number]) on 08/26/2016   Vitamin D deficiency    Patient Active Problem List   Diagnosis Date Noted   Seasonal allergic rhinitis due to pollen 10/25/2023   Microalbuminuria due to type 2 diabetes mellitus (HCC) 07/27/2023   Lung nodule 12/29/2022   Rheumatoid arthritis involving multiple sites with positive rheumatoid factor (HCC) 04/21/2022   Unintentional weight loss 04/05/2022   Secondary erythrocytosis 08/06/2021   Anemia 04/29/2020   Hepatitis 04/29/2020   Calculus  of kidney 10/15/2019   Pleural effusion, bilateral 03/10/2018   Mitral valve disorder 02/08/2018   Congestive heart failure (CHF) (HCC) 08/31/2017   GERD (gastroesophageal reflux disease) 08/31/2017   Cervical radiculopathy 06/20/2017   Episodic cluster headache 06/20/2017   Male erectile disorder 06/20/2017   Polyneuropathy 06/20/2017   Goals of care, counseling/discussion 05/02/2017   Smoldering multiple myeloma 04/19/2017   Iron  deficiency anemia 03/14/2017   PAD (peripheral artery disease) (HCC) 11/23/2016   Tobacco use disorder 11/23/2016   AAA (abdominal aortic aneurysm) without rupture (HCC) 11/23/2016   Acute diastolic CHF (congestive heart failure) (HCC) 08/27/2016   AVB (atrioventricular block) 08/27/2016   Cardiac pacemaker 08/27/2016   Leukocytosis 08/27/2016   Acute posthemorrhagic anemia 08/27/2016   Iron  deficiency anemia due to chronic blood loss 08/27/2016   Acute on chronic renal insufficiency 08/27/2016   Joint pain 08/27/2016   Gout  attack 08/27/2016   Disorder of kidney and ureter 08/27/2016   Upper GI bleed 08/21/2016   Acute subendocardial infarction, subsequent episode of care (HCC) 04/08/2016   Bradyarrhythmia 02/06/2016   Allergy 12/16/2015   Anxiety 12/16/2015   Atrial fibrillation (HCC) 12/16/2015   Diabetes mellitus (HCC) 12/16/2015   HLD (hyperlipidemia) 12/16/2015   BP (high blood pressure) 12/16/2015   Fatty liver disease, nonalcoholic 12/16/2015   Apnea, sleep 12/16/2015   Other chronic nonalcoholic liver disease 12/16/2015   Tubulovillous adenoma polyp of colon 08/19/2015   Benign neoplasm of colon 08/19/2015   Shortness of breath 08/19/2015   Hernia of abdominal cavity 07/31/2015   Coronary atherosclerosis of native coronary artery 03/31/2015   Malignant neoplasm of prostate (HCC) 02/20/2014   Rheumatoid arthritis, unspecified (HCC) 01/15/2014   History of rheumatoid arthritis 01/15/2014   Degeneration of cervical intervertebral disc 02/11/2011   Polyarthropathy or polyarthritis 02/11/2011   Past Surgical History:  Procedure Laterality Date   ABDOMINAL AORTIC ANEURYSM REPAIR  11/05/2013   CATARACT EXTRACTION W/PHACO Left 06/22/2023   Procedure: CATARACT EXTRACTION PHACO AND INTRAOCULAR LENS PLACEMENT (IOC) LEFT  CLAREON VIVITY TORIC  17.82  01:15.5;  Surgeon: Mittie Gaskin, MD;  Location: [facility];  Service: Ophthalmology;  Laterality: Left;   CATARACT EXTRACTION W/PHACO Right 07/06/2023   Procedure: CATARACT EXTRACTION PHACO AND INTRAOCULAR LENS PLACEMENT (IOC) RIGHT DIABETIC  CLAREON VIVITY TORIC;  Surgeon: Mittie Gaskin, MD;  Location: [facility];  Service: Ophthalmology;  Laterality: Right;  17.01 1:01.9   COLON SURGERY  08/06/2015   Right hemicolectomy for tubulovillous adenoma with high-grade dysplasia.   COLONOSCOPY WITH PROPOFOL  N/A 07/07/2015   Procedure: COLONOSCOPY WITH PROPOFOL ;  Surgeon: Donnice Vaughn Manes, MD;  Location: [facility] ENDOSCOPY;  Service:  Endoscopy;  Laterality: N/A;   COLONOSCOPY WITH PROPOFOL  N/A 10/28/2016   Procedure: COLONOSCOPY WITH PROPOFOL ;  Surgeon: Therisa Bi, MD;  Location: [facility] ENDOSCOPY;  Service: Endoscopy;  Laterality: N/A;   CYSTOSCOPY W/ RETROGRADES Bilateral 11/16/2021   Procedure: CYSTOSCOPY WITH RETROGRADE PYELOGRAM;  Surgeon: Penne Knee, MD;  Location: [facility] ORS;  Service: Urology;  Laterality: Bilateral;   CYSTOSCOPY WITH BIOPSY N/A 11/16/2021   Procedure: CYSTOSCOPY WITH BLADDER BIOPSY;  Surgeon: Penne Knee, MD;  Location: [facility] OR[facility];  Service: Urology;  Laterality: N/A;   ESOPHAGOGASTRODUODENOSCOPY (EGD) WITH PROPOFOL  N/A 10/28/2016   Procedure: ESOPHAGOGASTRODUODENOSCOPY (EGD) WITH PROPOFOL ;  Surgeon: Therisa Bi, MD;  Location: [facility] ENDOSCOPY;  Service: Endoscopy;  Laterality: N/A;   GIVENS CAPSULE STUDY N/A 07/25/2017   Procedure: GIVENS CAPSULE STUDY;  Surgeon: Therisa Bi, MD;  Location: [facility] ENDOSCOPY;  Service: Gastroenterology;  Laterality: N/A;   GIVENS CAPSULE STUDY N/A 01/29/2019   Procedure: GIVENS CAPSULE STUDY;  Surgeon: Therisa Bi, MD;  Location: [facility] ENDOSCOPY;  Service: Gastroenterology;  Laterality: N/A;   IR BONE MARROW BIOPSY & ASPIRATION  11/23/2023   LAPAROSCOPIC RIGHT COLECTOMY Right 08/08/2015   Procedure: LAPAROSCOPIC RIGHT COLECTOMY;  Surgeon: Reyes LELON Cota, MD;  Location: [facility] ORS;  Service: General;  Laterality: Right;   NASAL SINUS SURGERY     PACEMAKER INSERTION Left 08/26/2016   Procedure: INSERTION PACEMAKER;  Surgeon: Marsa Dooms, MD;  Location: [facility] ORS;  Service: Cardiovascular;  Laterality: Left;   PROSTATE BIOPSY  01/01/13, 01/30/14   Gleason 3+3=6, vol 46.6 cc   TONSILLECTOMY     uvula surgery     for sleep apnea   Current Outpatient Medications on File Prior to Visit  Medication Sig Dispense Refill   albuterol  (PROVENTIL  HFA;VENTOLIN  HFA) 108 (90 Base) MCG/ACT inhaler Inhale 1 puff into the lungs every 6 (six) hours as needed.     allopurinol   (ZYLOPRIM ) 100 MG tablet Take 1 tablet (100 mg total) by mouth daily. 90 tablet 3   ALPRAZolam  (XANAX ) 0.25 MG tablet Take by mouth 2 (two) times daily as needed.     atorvastatin  (LIPITOR) 80 MG tablet Take 1 tablet (80 mg total) by mouth at bedtime. 90 tablet 0   Azelastine  HCl 137 MCG/SPRAY SOLN Place 1 spray into the nose daily. 30 mL 2   BREO ELLIPTA  100-25 MCG/ACT AEPB Inhale 1 puff by mouth once daily 60 each 3   cetirizine  (ZYRTEC ) 10 MG tablet Take 1 tablet by mouth once daily 30 tablet 5   Cholecalciferol (VITAMIN D3) 1.25 MG (50000 UT) CAPS Take 1 capsule by mouth once a week 12 capsule 0   ELIQUIS 2.5 MG TABS tablet Take 1 tablet by mouth twice daily 60 tablet 0   empagliflozin  (JARDIANCE ) 10 MG TABS tablet Take 1 tablet (10 mg total) by mouth daily before breakfast. 90 tablet 0   ezetimibe (ZETIA) 10 MG tablet Take 1 tablet by mouth once daily 90 tablet 1   fluticasone  (FLONASE ) 50 MCG/ACT nasal spray Place 1 spray into both nostrils daily as needed for rhinitis. 48 mL 0   folic acid  (FOLVITE ) 1 MG tablet Take 1 mg by mouth daily.     gabapentin  (NEURONTIN ) 400 MG capsule Take 1 capsule (400 mg total) by mouth 3 (three) times daily. 90 capsule 0   HUMIRA PEN 40 MG/0.4ML PNKT Inject 40 mg into the skin every 14 (fourteen) days.     KERENDIA  10 MG TABS Take 1 tablet by mouth once daily 30 tablet 0   methotrexate  (RHEUMATREX) 2.5 MG tablet Take 10 mg by mouth once a week.     metoprolol succinate (TOPROL-XL) 25 MG 24 hr tablet Take 1 tablet by mouth once daily 90 tablet 0   mupirocin  ointment (BACTROBAN ) 2 % APPLY TO RIGHT GREAT TOE ONCE DAILY UNTIL HEALED 66 g 0   pantoprazole  (PROTONIX ) 20 MG tablet Take 1 tablet by mouth once daily 90 tablet 0   tamsulosin  (FLOMAX ) 0.4 MG CAPS capsule Take 1 capsule (0.4 mg total) by mouth daily. 90 capsule 3   traMADol -acetaminophen  (ULTRACET ) 37.5-325 MG tablet TAKE 1 TABLET BY MOUTH EVERY 6 HOURS AS NEEDED FOR PAIN 30 tablet 0   triamcinolone   cream (KENALOG ) 0.1 % Apply 1 Application topically 2 (two) times daily. to affected area     No current facility-administered medications on file prior to visit.  No Known Allergies Social History   Occupational History   Not on file  Tobacco Use   Smoking status: Every Day    Current packs/day: 1.00    Average packs/day: 1 pack/day for 61.6 years (61.6 ttl pk-yrs)    Types: Cigarettes    Start date: 1964   Smokeless tobacco: Never   Tobacco comments:    DOWN TO 1/2 PPD  Vaping Use   Vaping status: Never Used  Substance and Sexual Activity   Alcohol use: Yes    Alcohol/week: 1.0 standard drink of alcohol    Types: 1 Cans of beer per week    Comment: occasionally   Drug use: No   Sexual activity: Not Currently   Family History  Problem Relation Age of Onset   Heart attack Mother    Cirrhosis Father    Alzheimer's disease Sister    Pancreatic cancer Brother    Diabetes Brother    Diabetes Daughter        medication induced for cancer treatments   Cancer Daughter        breast, brain   Immunization History  Administered Date(s) Administered   Fluad Quad(high Dose 65+) 03/14/2023   Influenza, High Dose Seasonal PF 02/02/2018, 02/08/2019   Influenza-Unspecified 02/24/2018   PFIZER(Purple Top)SARS-COV-2 Vaccination 06/25/2019, 07/16/2019   Tdap 10/03/2017   Unspecified SARS-COV-2 Vaccination 03/14/2023   Zoster Recombinant(Shingrix) 03/06/2018     Review of Systems: Negative except as noted in the HPI.   Objective: There were no vitals filed for this visit.  LARK RUNK is a pleasant 83 y.o. male in NAD. AAO X 3.  Diabetic foot exam was performed with the following findings:   Vascular Examination: CFT <4 seconds b/l. Nonpalpable pedal pulses b/l.  Digital hair absent. Skin temperature gradient warm to cool b/l. No ischemia or gangrene. No cyanosis or clubbing noted b/l. No edema noted b/l LE.   Neurological Examination: Sensation grossly intact b/l  with 10 gram monofilament. Vibratory sensation intact b/l. Pt has subjective symptoms of neuropathy.  Dermatological Examination: Pedal skin thin, shiny and atrophic b/l. No open wounds. No interdigital macerations.   Toenails 1-5 b/l thick, discolored, elongated with subungual debris and pain on dorsal palpation.   No hyperkeratotic nor porokeratotic lesions.  Musculoskeletal Examination: Muscle strength 5/5 to all lower extremity muscle groups bilaterally. HAV with bunion b/l. Hammertoe(s) right second digit.SABRA No pain, crepitus or joint limitation noted with ROM b/l LE.  Patient ambulates independently without assistive aids.  Radiographs: None     Lab Results  Component Value Date   HGBA1C 6.2 (H) 10/20/2023   ADA Risk Categorization: High Risk  Patient has one or more of the following: Loss of protective sensation Absent pedal pulses Severe Foot deformity History of foot ulcer  Assessment: 1. Pain due to onychomycosis of nail   2. Hallux valgus, acquired, bilateral   3. Acquired hammertoes of both feet   4. Type II diabetes mellitus with peripheral circulatory disorder (HCC)   5. Encounter for diabetic foot exam (HCC)     Plan: Diabetic foot examination performed today.  All patient's and/or POA's questions/concerns addressed on today's visit. Mycotic toenails 1-5 debrided in length and girth without incident. Continue daily foot inspections and monitor blood glucose per PCP/Endocrinologist's recommendations. Continue soft, supportive shoe gear daily. Report any pedal injuries to medical professional. Call office if there are any questions/concerns. -Patient/POA to call should there be question/concern in the interim. Return in about 3 months (around  04/06/2024).  Delon LITTIE Merlin, DPM      [place] LOCATION: [address], [place] [address]                   Office [phone]   [facility]  LOCATION: [address] [place], [place] [address] Office [phone]

## 2024-01-10 ENCOUNTER — Other Ambulatory Visit: Payer: Self-pay | Admitting: Internal Medicine

## 2024-01-10 DIAGNOSIS — K219 Gastro-esophageal reflux disease without esophagitis: Secondary | ICD-10-CM

## 2024-01-11 ENCOUNTER — Other Ambulatory Visit: Payer: Self-pay | Admitting: Internal Medicine

## 2024-01-11 DIAGNOSIS — R809 Proteinuria, unspecified: Secondary | ICD-10-CM

## 2024-01-12 DIAGNOSIS — E785 Hyperlipidemia, unspecified: Secondary | ICD-10-CM | POA: Diagnosis not present

## 2024-01-12 DIAGNOSIS — I129 Hypertensive chronic kidney disease with stage 1 through stage 4 chronic kidney disease, or unspecified chronic kidney disease: Secondary | ICD-10-CM | POA: Diagnosis not present

## 2024-01-12 DIAGNOSIS — N1832 Chronic kidney disease, stage 3b: Secondary | ICD-10-CM | POA: Diagnosis not present

## 2024-01-12 DIAGNOSIS — E1122 Type 2 diabetes mellitus with diabetic chronic kidney disease: Secondary | ICD-10-CM | POA: Diagnosis not present

## 2024-01-12 DIAGNOSIS — R809 Proteinuria, unspecified: Secondary | ICD-10-CM | POA: Diagnosis not present

## 2024-01-19 ENCOUNTER — Other Ambulatory Visit

## 2024-01-19 DIAGNOSIS — N1832 Chronic kidney disease, stage 3b: Secondary | ICD-10-CM | POA: Diagnosis not present

## 2024-01-19 DIAGNOSIS — E782 Mixed hyperlipidemia: Secondary | ICD-10-CM | POA: Diagnosis not present

## 2024-01-19 DIAGNOSIS — E119 Type 2 diabetes mellitus without complications: Secondary | ICD-10-CM | POA: Diagnosis not present

## 2024-01-19 LAB — HEMOGLOBIN A1C
Est. average glucose Bld gHb Est-mCnc: 137 mg/dL
Hgb A1c MFr Bld: 6.4 % — ABNORMAL HIGH (ref 4.8–5.6)

## 2024-01-20 ENCOUNTER — Other Ambulatory Visit: Payer: Self-pay | Admitting: Cardiovascular Disease

## 2024-01-20 LAB — COMPREHENSIVE METABOLIC PANEL WITH GFR
ALT: 14 IU/L (ref 0–44)
AST: 17 IU/L (ref 0–40)
Albumin: 3.7 g/dL (ref 3.7–4.7)
Alkaline Phosphatase: 81 IU/L (ref 44–121)
BUN/Creatinine Ratio: 9 — ABNORMAL LOW (ref 10–24)
BUN: 20 mg/dL (ref 8–27)
Bilirubin Total: 0.4 mg/dL (ref 0.0–1.2)
CO2: 19 mmol/L — ABNORMAL LOW (ref 20–29)
Calcium: 9.5 mg/dL (ref 8.6–10.2)
Chloride: 105 mmol/L (ref 96–106)
Creatinine, Ser: 2.21 mg/dL — ABNORMAL HIGH (ref 0.76–1.27)
Globulin, Total: 3.4 g/dL (ref 1.5–4.5)
Glucose: 120 mg/dL — ABNORMAL HIGH (ref 70–99)
Potassium: 4.7 mmol/L (ref 3.5–5.2)
Sodium: 139 mmol/L (ref 134–144)
Total Protein: 7.1 g/dL (ref 6.0–8.5)
eGFR: 29 mL/min/1.73 — ABNORMAL LOW (ref >59)

## 2024-01-20 LAB — LIPID PANEL
Chol/HDL Ratio: 2.8 ratio (ref 0.0–5.0)
Cholesterol, Total: 108 mg/dL (ref 100–199)
HDL: 39 mg/dL — ABNORMAL LOW (ref >39)
LDL Chol Calc (NIH): 52 mg/dL (ref 0–99)
Triglycerides: 86 mg/dL (ref 0–149)
VLDL Cholesterol Cal: 17 mg/dL (ref 5–40)

## 2024-01-24 ENCOUNTER — Encounter: Payer: Self-pay | Admitting: Cardiovascular Disease

## 2024-01-24 ENCOUNTER — Ambulatory Visit: Admitting: Cardiovascular Disease

## 2024-01-24 VITALS — BP 110/72 | HR 72 | Ht 71.0 in | Wt 216.2 lb

## 2024-01-24 DIAGNOSIS — I739 Peripheral vascular disease, unspecified: Secondary | ICD-10-CM | POA: Diagnosis not present

## 2024-01-24 DIAGNOSIS — R0602 Shortness of breath: Secondary | ICD-10-CM | POA: Diagnosis not present

## 2024-01-24 DIAGNOSIS — I480 Paroxysmal atrial fibrillation: Secondary | ICD-10-CM | POA: Diagnosis not present

## 2024-01-24 DIAGNOSIS — F17200 Nicotine dependence, unspecified, uncomplicated: Secondary | ICD-10-CM | POA: Diagnosis not present

## 2024-01-24 DIAGNOSIS — I7143 Infrarenal abdominal aortic aneurysm, without rupture: Secondary | ICD-10-CM | POA: Diagnosis not present

## 2024-01-24 DIAGNOSIS — E861 Hypovolemia: Secondary | ICD-10-CM | POA: Diagnosis not present

## 2024-01-24 DIAGNOSIS — Z0130 Encounter for examination of blood pressure without abnormal findings: Secondary | ICD-10-CM

## 2024-01-24 NOTE — Progress Notes (Signed)
 Cardiology Office Note   Date:  01/24/2024   ID:  Omar [name], Omar [name] 23-Jul-1940, MRN [id number]  PCP:  Albina GORMAN Dine, MD  Cardiologist:  Denyse Bathe, MD      History of Present Illness: Omar [name] is a 83 y.o. male who presents for  Chief Complaint  Patient presents with   Follow-up    3 month follow up     Has DOE, walking in parking lot.      Past Medical History:  Diagnosis Date   AAA (abdominal aortic aneurysm) (HCC)    a.) s/p EVAR 11/05/2013.   Anxiety    a.) on BZO (alprazolam ) PRN   Aortic atherosclerosis (HCC)    Atrial fibrillation (HCC)    a.) CHA2DS2-VASc = 6 (age x2, CHF, HTN, PVD/aortic plaque, T2DM). b.) rate/rhythm maintained without use of pharmacological intervention; chronically anticoagulated using dose reduced apixaban.   BPH (benign prostatic hyperplasia)    CHF (congestive heart failure) (HCC)    a.)  TTE 03/16/2021: EF 63%; moderate LVH; mild BAE mild to moderate pan valvular regurgitation; G1DD.   CKD (chronic kidney disease), stage III (HCC)    Colon polyp 07/07/2015   TUBULAR ADENOMA WITH AT LEAST HIGH-GRADE / Dr Jeri   COPD (chronic obstructive pulmonary disease) (HCC)    Coronary artery disease    a.) coronary CTA 01/10/2015 CA score 722.3; mod-sev LAD disease; mild LCx disease; 50-60% RCA disease.   Current use of long term anticoagulation    a.) dose reduced apixaban   Diverticulosis    Erectile dysfunction    GERD (gastroesophageal reflux disease)    Gout    Grade I diastolic dysfunction    Heart murmur    Hepatic cyst    Hepatic steatosis    Hepatitis    Hypercholesterolemia    Hypertension    Iron  deficiency anemia    Migraines    Moderate tricuspid regurgitation by prior echocardiogram    Multiple myeloma (HCC)    Nephrolithiasis    OSA on CPAP    Presence of permanent cardiac pacemaker    Prostate cancer (HCC) 01/01/13, 01/30/14   Gleason 3+4=7, volume 46.6 cc   PVD (peripheral vascular disease)  (HCC)    Renal cyst    Rheumatoid arthritis (HCC)    S/P radiation therapy  04/03/2014 through 06/04/2014                                                      Prostate 7800 cGy in 40 sessions                           SSS (sick sinus syndrome) (HCC)    a.) s/p Medtronic Azure PPM placement 08/26/2016   T2DM (type 2 diabetes mellitus) (HCC)    Third degree heart block (HCC) 08/26/2016   a.) s/p PPM placement (Medtronic Azure XT DR MRI [id number] S/N: [id number]) on 08/26/2016   Vitamin D deficiency      Past Surgical History:  Procedure Laterality Date   ABDOMINAL AORTIC ANEURYSM REPAIR  11/05/2013   CATARACT EXTRACTION W/PHACO Left 06/22/2023   Procedure: CATARACT EXTRACTION PHACO AND INTRAOCULAR LENS PLACEMENT (IOC) LEFT  CLAREON VIVITY TORIC  17.82  01:15.5;  Surgeon: Mittie Gaskin, MD;  Location: [facility];  Service: Ophthalmology;  Laterality: Left;   CATARACT EXTRACTION W/PHACO Right 07/06/2023   Procedure: CATARACT EXTRACTION PHACO AND INTRAOCULAR LENS PLACEMENT (IOC) RIGHT DIABETIC  CLAREON VIVITY TORIC;  Surgeon: Mittie Gaskin, MD;  Location: [facility];  Service: Ophthalmology;  Laterality: Right;  17.01 1:01.9   COLON SURGERY  08/06/2015   Right hemicolectomy for tubulovillous adenoma with high-grade dysplasia.   COLONOSCOPY WITH PROPOFOL  N/A 07/07/2015   Procedure: COLONOSCOPY WITH PROPOFOL ;  Surgeon: Donnice Vaughn Manes, MD;  Location: [facility] END[facility]COPY;  Service: Endoscopy;  Laterality: N/A;   COLONOSCOPY WITH PROPOFOL  N/A 10/28/2016   Procedure: COLONOSCOPY WITH PROPOFOL ;  Surgeon: Therisa Bi, MD;  Location: [facility] ENDOSCOPY;  Service: Endoscopy;  Laterality: N/A;   CYSTOSCOPY W/ RETROGRADES Bilateral 11/16/2021   Procedure: CYSTOSCOPY WITH RETROGRADE PYELOGRAM;  Surgeon: Penne Knee, MD;  Location: [facility];  Service: Urology;  Laterality: Bilateral;   CYSTOSCOPY WITH BIOPSY N/A 11/16/2021   Procedure: CYSTOSCOPY WITH BLADDER BIOPSY;  Surgeon:  Penne Knee, MD;  Location: [facility];  Service: Urology;  Laterality: N/A;   ESOPHAGOGASTRODUODENOSCOPY (EGD) WITH PROPOFOL  N/A 10/28/2016   Procedure: ESOPHAGOGASTRODUODENOSCOPY (EGD) WITH PROPOFOL ;  Surgeon: Therisa Bi, MD;  Location: [facility];  Service: Endoscopy;  Laterality: N/A;   GIVENS CAPSULE STUDY N/A 07/25/2017   Procedure: GIVENS CAPSULE STUDY;  Surgeon: Therisa Bi, MD;  Location: [facility] ENDOSCOPY;  Service: Gastroenterology;  Laterality: N/A;   GIVENS CAPSULE STUDY N/A 01/29/2019   Procedure: GIVENS CAPSULE STUDY;  Surgeon: Therisa Bi, MD;  Location: [facility] ENDOSCOPY;  Service: Gastroenterology;  Laterality: N/A;   IR BONE MARROW BIOPSY & ASPIRATION  11/23/2023   LAPAROSCOPIC RIGHT COLECTOMY Right 08/08/2015   Procedure: LAPAROSCOPIC RIGHT COLECTOMY;  Surgeon: Reyes LELON Cota, MD;  Location: [facility];  Service: General;  Laterality: Right;   NASAL SINUS SURGERY     PACEMAKER INSERTION Left 08/26/2016   Procedure: INSERTION PACEMAKER;  Surgeon: Marsa Dooms, MD;  Location: [facility];  Service: Cardiovascular;  Laterality: Left;   PROSTATE BIOPSY  01/01/13, 01/30/14   Gleason 3+3=6, vol 46.6 cc   TONSILLECTOMY     uvula surgery     for sleep apnea     Current Outpatient Medications  Medication Sig Dispense Refill   albuterol  (PROVENTIL  HFA;VENTOLIN  HFA) 108 (90 Base) MCG/ACT inhaler Inhale 1 puff into the lungs every 6 (six) hours as needed.     allopurinol  (ZYLOPRIM ) 100 MG tablet Take 1 tablet (100 mg total) by mouth daily. 90 tablet 3   ALPRAZolam  (XANAX ) 0.25 MG tablet Take by mouth 2 (two) times daily as needed.     atorvastatin  (LIPITOR) 80 MG tablet Take 1 tablet (80 mg total) by mouth at bedtime. 90 tablet 0   Azelastine  HCl 137 MCG/SPRAY SOLN Place 1 spray into the nose daily. 30 mL 2   BREO ELLIPTA  100-25 MCG/ACT AEPB Inhale 1 puff by mouth once daily 60 each 3   cetirizine  (ZYRTEC ) 10 MG tablet Take 1 tablet by mouth once daily 30 tablet 5    Cholecalciferol (VITAMIN D3) 1.25 MG (50000 UT) CAPS Take 1 capsule by mouth once a week 12 capsule 0   ELIQUIS 2.5 MG TABS tablet Take 1 tablet by mouth twice daily 60 tablet 0   empagliflozin  (JARDIANCE ) 10 MG TABS tablet Take 1 tablet (10 mg total) by mouth daily before breakfast. 90 tablet 0   fluticasone  (FLONASE ) 50 MCG/ACT nasal spray Place 1 spray into both nostrils daily as needed for rhinitis. 48 mL 0   folic acid  (  FOLVITE ) 1 MG tablet Take 1 mg by mouth daily.     gabapentin  (NEURONTIN ) 400 MG capsule Take 1 capsule (400 mg total) by mouth 3 (three) times daily. 90 capsule 0   HUMIRA PEN 40 MG/0.4ML PNKT Inject 40 mg into the skin every 14 (fourteen) days.     metoprolol succinate (TOPROL-XL) 25 MG 24 hr tablet Take 1 tablet by mouth once daily 90 tablet 0   mupirocin  ointment (BACTROBAN ) 2 % APPLY TO RIGHT GREAT TOE ONCE DAILY UNTIL HEALED 66 g 0   pantoprazole  (PROTONIX ) 20 MG tablet Take 1 tablet by mouth once daily 90 tablet 0   tamsulosin  (FLOMAX ) 0.4 MG CAPS capsule Take 1 capsule (0.4 mg total) by mouth daily. 90 capsule 3   traMADol -acetaminophen  (ULTRACET ) 37.5-325 MG tablet TAKE 1 TABLET BY MOUTH EVERY 6 HOURS AS NEEDED FOR PAIN 30 tablet 0   triamcinolone  cream (KENALOG ) 0.1 % Apply 1 Application topically 2 (two) times daily. to affected area     ezetimibe (ZETIA) 10 MG tablet Take 1 tablet by mouth once daily 90 tablet 1   KERENDIA  10 MG TABS Take 1 tablet by mouth once daily 30 tablet 0   methotrexate  (RHEUMATREX) 2.5 MG tablet Take 10 mg by mouth once a week.     No current facility-administered medications for this visit.    Allergies:   Patient has no known allergies.    Social History:   reports that he has been smoking cigarettes. He started smoking about 61 years ago. He has a 61.6 pack-year smoking history. He has never used smokeless tobacco. He reports current alcohol use of about 1.0 standard drink of alcohol per week. He reports that he does not use drugs.    Family History:  family history includes Alzheimer's disease in his sister; Cancer in his daughter; Cirrhosis in his father; Diabetes in his brother and daughter; Heart attack in his mother; Pancreatic cancer in his brother.    ROS:     Review of Systems  Constitutional: Negative.   HENT: Negative.    Eyes: Negative.   Respiratory: Negative.    Gastrointestinal: Negative.   Genitourinary: Negative.   Musculoskeletal: Negative.   Skin: Negative.   Neurological: Negative.   Endo/Heme/Allergies: Negative.   Psychiatric/Behavioral: Negative.    All other systems reviewed and are negative.     All other systems are reviewed and negative.    PHYSICAL EXAM: VS:  BP 110/72   Pulse 72   Ht 5' 11 (1.803 m)   Wt 216 lb 3.2 oz (98.1 kg)   SpO2 93%   BMI 30.15 kg/m  , BMI Body mass index is 30.15 kg/m. Last weight:  Wt Readings from Last 3 Encounters:  01/24/24 216 lb 3.2 oz (98.1 kg)  12/06/23 214 lb 4.8 oz (97.2 kg)  11/25/23 213 lb 12.8 oz (97 kg)     Physical Exam Vitals reviewed.  Constitutional:      Appearance: Normal appearance. He is normal weight.  HENT:     Head: Normocephalic.     Nose: Nose normal.     Mouth/Throat:     Mouth: Mucous membranes are moist.  Eyes:     Pupils: Pupils are equal, round, and reactive to light.  Cardiovascular:     Rate and Rhythm: Normal rate and regular rhythm.     Pulses: Normal pulses.     Heart sounds: Normal heart sounds.  Pulmonary:     Effort: Pulmonary effort is normal.  Abdominal:     General: Abdomen is flat. Bowel sounds are normal.  Musculoskeletal:        General: Normal range of motion.     Cervical back: Normal range of motion.  Skin:    General: Skin is warm.  Neurological:     General: No focal deficit present.     Mental Status: He is alert.  Psychiatric:        Mood and Affect: Mood normal.       EKG:   Recent Labs: 11/23/2023: Hemoglobin 16.0; Platelets 243 01/19/2024: ALT 14; BUN 20;  Creatinine, Ser 2.21; Potassium 4.7; Sodium 139    Lipid Panel    Component Value Date/Time   CHOL 108 01/19/2024 1334   TRIG 86 01/19/2024 1334   HDL 39 (L) 01/19/2024 1334   CHOLHDL 2.8 01/19/2024 1334   CHOLHDL 3.4 08/22/2016 0447   VLDL 13 08/22/2016 0447   LDLCALC 52 01/19/2024 1334      Other studies Reviewed: Additional studies/ records that were reviewed today include:  Review of the above records demonstrates:       No data to display            ASSESSMENT AND PLAN:    ICD-10-CM   1. Infrarenal abdominal aortic aneurysm (AAA) without rupture (HCC)  I71.43     2. Paroxysmal atrial fibrillation (HCC)  I48.0     3. PAD (peripheral artery disease) (HCC)  I73.9     4. Tobacco use disorder  F17.200     5. SOB (shortness of breath)  R06.02    LVEF 45-50 %, unable to take lisinoppril as BP lklow and creat over 2.    6. Hypotension due to hypovolemia  E86.1    Creat was over 2.1 and was stopped by renal. He is still taking , so will stop it today. He is also taking jaurdiance.       Problem List Items Addressed This Visit       Cardiovascular and Mediastinum   Atrial fibrillation (HCC)   PAD (peripheral artery disease) (HCC)   AAA (abdominal aortic aneurysm) without rupture (HCC) - Primary     Other   Tobacco use disorder (Chronic)   Other Visit Diagnoses       SOB (shortness of breath)       LVEF 45-50 %, unable to take lisinoppril as BP lklow and creat over 2.     Hypotension due to hypovolemia       Creat was over 2.1 and was stopped by renal. He is still taking , so will stop it today. He is also taking jaurdiance.          Disposition:   Return in about 3 months (around 04/25/2024).    Total time spent: 30 minutes  Signed,  Denyse Bathe, MD  01/24/2024 9:32 AM    [facility]

## 2024-01-24 NOTE — Patient Instructions (Signed)
Stop taking Lisinopril

## 2024-01-26 DIAGNOSIS — H353131 Nonexudative age-related macular degeneration, bilateral, early dry stage: Secondary | ICD-10-CM | POA: Diagnosis not present

## 2024-01-26 DIAGNOSIS — E119 Type 2 diabetes mellitus without complications: Secondary | ICD-10-CM | POA: Diagnosis not present

## 2024-01-26 DIAGNOSIS — H43813 Vitreous degeneration, bilateral: Secondary | ICD-10-CM | POA: Diagnosis not present

## 2024-01-26 DIAGNOSIS — H04123 Dry eye syndrome of bilateral lacrimal glands: Secondary | ICD-10-CM | POA: Diagnosis not present

## 2024-01-27 ENCOUNTER — Ambulatory Visit: Admitting: Internal Medicine

## 2024-01-27 VITALS — BP 124/76 | HR 81 | Temp 97.9°F | Ht 71.0 in | Wt 215.2 lb

## 2024-01-27 DIAGNOSIS — Z0130 Encounter for examination of blood pressure without abnormal findings: Secondary | ICD-10-CM

## 2024-01-27 DIAGNOSIS — R0602 Shortness of breath: Secondary | ICD-10-CM | POA: Diagnosis not present

## 2024-01-27 DIAGNOSIS — E782 Mixed hyperlipidemia: Secondary | ICD-10-CM

## 2024-01-27 DIAGNOSIS — J301 Allergic rhinitis due to pollen: Secondary | ICD-10-CM | POA: Diagnosis not present

## 2024-01-27 DIAGNOSIS — E119 Type 2 diabetes mellitus without complications: Secondary | ICD-10-CM

## 2024-01-27 LAB — POCT CBG (FASTING - GLUCOSE)-MANUAL ENTRY: Glucose Fasting, POC: 161 mg/dL — AB (ref 70–99)

## 2024-01-27 MED ORDER — AZELASTINE HCL 137 MCG/SPRAY NA SOLN: 1.0000 | Freq: Every day | NASAL | 2 refills | Status: AC

## 2024-01-27 MED ORDER — FLUTICASONE PROPIONATE 50 MCG/ACT NA SUSP: 1.0000 | Freq: Every day | NASAL | 0 refills | Status: AC | PRN

## 2024-01-27 MED ORDER — BREO ELLIPTA 100-25 MCG/ACT IN AEPB
1.0000 | INHALATION_SPRAY | Freq: Every day | RESPIRATORY_TRACT | 2 refills | Status: DC
Start: 2024-01-27 — End: 2024-04-23

## 2024-01-27 MED ORDER — GABAPENTIN 400 MG PO CAPS: 400.0000 mg | ORAL_CAPSULE | Freq: Three times a day (TID) | ORAL | 0 refills | Status: DC

## 2024-01-27 MED ORDER — ATORVASTATIN CALCIUM 80 MG PO TABS: 80.0000 mg | ORAL_TABLET | Freq: Every day | ORAL | 0 refills | Status: DC

## 2024-01-27 NOTE — Progress Notes (Signed)
 Established Patient Office Visit  Subjective:  Patient ID: Omar [name], male    DOB: Apr 27, 1941  Age: 82 y.o. MRN: [id number]  Chief Complaint  Patient presents with   Follow-up    2 month lab results     No new complaints, here for lab review and medication refills. Last cmp notable for rising Cr but re-established f/u with nephrology.  notable for well controlled diabetes, A1c at target and lipids at target. Denies any hypoglycemic episodes and home bg readings have been at target.      No other concerns at this time.   Past Medical History:  Diagnosis Date   AAA (abdominal aortic aneurysm) (HCC)    a.) Keirston Saephanh/p EVAR 11/05/2013.   Anxiety    a.) on BZO (alprazolam ) PRN   Aortic atherosclerosis (HCC)    Atrial fibrillation (HCC)    a.) CHA2DS2-VASc = 6 (age x2, CHF, HTN, PVD/aortic plaque, T2DM). b.) rate/rhythm maintained without use of pharmacological intervention; chronically anticoagulated using dose reduced apixaban.   BPH (benign prostatic hyperplasia)    CHF (congestive heart failure) (HCC)    a.)  TTE 03/16/2021: EF 63%; moderate LVH; mild BAE mild to moderate pan valvular regurgitation; G1DD.   CKD (chronic kidney disease), stage III (HCC)    Colon polyp 07/07/2015   TUBULAR ADENOMA WITH AT LEAST HIGH-GRADE / Dr Jeri   COPD (chronic obstructive pulmonary disease) (HCC)    Coronary artery disease    a.) coronary CTA 01/10/2015 CA score 722.3; mod-sev LAD disease; mild LCx disease; 50-60% RCA disease.   Current use of long term anticoagulation    a.) dose reduced apixaban   Diverticulosis    Erectile dysfunction    GERD (gastroesophageal reflux disease)    Gout    Grade I diastolic dysfunction    Heart murmur    Hepatic cyst    Hepatic steatosis    Hepatitis    Hypercholesterolemia    Hypertension    Iron  deficiency anemia    Migraines    Moderate tricuspid regurgitation by prior echocardiogram    Multiple myeloma (HCC)    Nephrolithiasis    OSA on  CPAP    Presence of permanent cardiac pacemaker    Prostate cancer (HCC) 01/01/13, 01/30/14   Gleason 3+4=7, volume 46.6 cc   PVD (peripheral vascular disease) (HCC)    Renal cyst    Rheumatoid arthritis (HCC)    Tannia Contino/P radiation therapy  04/03/2014 through 06/04/2014                                                      Prostate 7800 cGy in 40 sessions                           SSS (sick sinus syndrome) (HCC)    a.) Aziah Brostrom/p Medtronic Azure PPM placement 08/26/2016   T2DM (type 2 diabetes mellitus) (HCC)    Third degree heart block (HCC) 08/26/2016   a.) Allicia Culley/p PPM placement (Medtronic Azure XT DR MRI [id number] Jernee Murtaugh/N: [id number]) on 08/26/2016   Vitamin D deficiency     Past Surgical History:  Procedure Laterality Date   ABDOMINAL AORTIC ANEURYSM REPAIR  11/05/2013   CATARACT EXTRACTION W/PHACO Left 06/22/2023   Procedure: CATARACT EXTRACTION PHACO AND INTRAOCULAR LENS PLACEMENT (IOC) LEFT  CLAREON VIVITY TORIC  17.82  01:15.5;  Surgeon: Mittie Gaskin, MD;  Location: [facility];  Service: Ophthalmology;  Laterality: Left;   CATARACT EXTRACTION W/PHACO Right 07/06/2023   Procedure: CATARACT EXTRACTION PHACO AND INTRAOCULAR LENS PLACEMENT (IOC) RIGHT DIABETIC  CLAREON VIVITY TORIC;  Surgeon: Mittie Gaskin, MD;  Location: [facility];  Service: Ophthalmology;  Laterality: Right;  17.01 1:01.9   COLON SURGERY  08/06/2015   Right hemicolectomy for tubulovillous adenoma with high-grade dysplasia.   COLONOSCOPY WITH PROPOFOL  N/A 07/07/2015   Procedure: COLONOSCOPY WITH PROPOFOL ;  Surgeon: Donnice Vaughn Manes, MD;  Location[facility];  Service: Endoscopy;  Laterality: N/A;   COLONOSCOPY WITH PROPOFOL  N/A 10/28/2016   Procedure: COLONOSCOPY WITH PROPOFOL ;  Surgeon: Therisa Bi, MD;  Location: [facility] END[facility]COPY;  Service: Endoscopy;  Laterality: N/A;   CYSTOSCOPY W/ RETROGRADES Bilateral 11/16/2021   Procedure: CYSTOSCOPY WITH RETROGRADE PYELOGRAM;  Surgeon: Penne Knee, MD;   Location: [facility] ORS;  Service: Urology;  Laterality: Bilateral;   CYSTOSCOPY WITH BIOPSY N/A 11/16/2021   Procedure: CYSTOSCOPY WITH BLADDER BIOPSY;  Surgeon: Penne Knee, MD;  Location: [facility];  Service: Urology;  Laterality: N/A;   ESOPHAGOGASTRODUODENOSCOPY (EGD) WITH PROPOFOL  N/A 10/28/2016   Procedure: ESOPHAGOGASTRODUODENOSCOPY (EGD) WITH PROPOFOL ;  Surgeon: Therisa Bi, MD;  Location: [facility] ENDOSCOPY;  Service: Endoscopy;  Laterality: N/A;   GIVENS CAPSULE STUDY N/A 07/25/2017   Procedure: GIVENS CAPSULE STUDY;  Surgeon: Therisa Bi, MD;  Location: [facility] ENDOSCOPY;  Service: Gastroenterology;  Laterality: N/A;   GIVENS CAPSULE STUDY N/A 01/29/2019   Procedure: GIVENS CAPSULE STUDY;  Surgeon: Therisa Bi, MD;  Location[facility];  Service: Gastroenterology;  Laterality: N/A;   IR BONE MARROW BIOPSY & ASPIRATION  11/23/2023   LAPAROSCOPIC RIGHT COLECTOMY Right 08/08/2015   Procedure: LAPAROSCOPIC RIGHT COLECTOMY;  Surgeon: Reyes LELON Cota, MD;  Location: [facility] OR[facility];  Service: General;  Laterality: Right;   NASAL SINUS SURGERY     PACEMAKER INSERTION Left 08/26/2016   Procedure: INSERTION PACEMAKER;  Surgeon: Marsa Dooms, MD;  Location: [facility] OR[facility];  Service: Cardiovascular;  Laterality: Left;   PROSTATE BIOPSY  01/01/13, 01/30/14   Gleason 3+3=6, vol 46.6 cc   TONSILLECTOMY     uvula surgery     for sleep apnea    Social History   Socioeconomic History   Marital status: Divorced    Spouse name: Not on file   Number of children: Not on file   Years of education: Not on file   Highest education level: Not on file  Occupational History   Not on file  Tobacco Use   Smoking status: Every Day    Current packs/day: 1.00    Average packs/day: 1 pack/day for 61.6 years (61.6 ttl pk-yrs)    Types: Cigarettes    Start date: 1964   Smokeless tobacco: Never   Tobacco comments:    DOWN TO 1/2 PPD  Vaping Use   Vaping status: Never Used  Substance and Sexual Activity    Alcohol use: Yes    Alcohol/week: 1.0 standard drink of alcohol    Types: 1 Cans of beer per week    Comment: occasionally   Drug use: No   Sexual activity: Not Currently  Other Topics Concern   Not on file  Social History Narrative   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   Social Drivers of Health   Financial Resource Strain: Not on file  Food Insecurity: No Food Insecurity (09/14/2022)   Hunger Vital Sign  Worried About Programme researcher, broadcasting/film/video in the Last Year: Never true    Ran Out of Food in the Last Year: Never true  Transportation Needs: No Transportation Needs (09/14/2022)   PRAPARE - Administrator, Civil Service (Medical): No    Lack of Transportation (Non-Medical): No  Physical Activity: Not on file  Stress: Not on file  Social Connections: Not on file  Intimate Partner Violence: Not on file    Family History  Problem Relation Age of Onset   Heart attack Mother    Cirrhosis Father    Alzheimer'Taeja Debellis disease Sister    Pancreatic cancer Brother    Diabetes Brother    Diabetes Daughter        medication induced for cancer treatments   Cancer Daughter        breast, brain    No Known Allergies  Outpatient Medications Prior to Visit  Medication Sig   albuterol  (PROVENTIL  HFA;VENTOLIN  HFA) 108 (90 Base) MCG/ACT inhaler Inhale 1 puff into the lungs every 6 (six) hours as needed.   allopurinol  (ZYLOPRIM ) 100 MG tablet Take 1 tablet (100 mg total) by mouth daily.   ALPRAZolam  (XANAX ) 0.25 MG tablet Take by mouth 2 (two) times daily as needed.   Cholecalciferol (VITAMIN D3) 1.25 MG (50000 UT) CAPS Take 1 capsule by mouth once a week   ELIQUIS 2.5 MG TABS tablet Take 1 tablet by mouth twice daily   empagliflozin  (JARDIANCE ) 10 MG TABS tablet Take 1 tablet (10 mg total) by mouth daily before breakfast.   ezetimibe (ZETIA) 10 MG tablet Take 1 tablet by mouth once daily   folic acid  (FOLVITE ) 1 MG tablet Take 1 mg by mouth daily.   HUMIRA PEN 40 MG/0.4ML PNKT Inject 40  mg into the skin every 14 (fourteen) days.   KERENDIA  10 MG TABS Take 1 tablet by mouth once daily   methotrexate  (RHEUMATREX) 2.5 MG tablet Take 10 mg by mouth once a week.   metoprolol succinate (TOPROL-XL) 25 MG 24 hr tablet Take 1 tablet by mouth once daily   mupirocin  ointment (BACTROBAN ) 2 % APPLY TO RIGHT GREAT TOE ONCE DAILY UNTIL HEALED   pantoprazole  (PROTONIX ) 20 MG tablet Take 1 tablet by mouth once daily   tamsulosin  (FLOMAX ) 0.4 MG CAPS capsule Take 1 capsule (0.4 mg total) by mouth daily.   traMADol -acetaminophen  (ULTRACET ) 37.5-325 MG tablet TAKE 1 TABLET BY MOUTH EVERY 6 HOURS AS NEEDED FOR PAIN   triamcinolone  cream (KENALOG ) 0.1 % Apply 1 Application topically 2 (two) times daily. to affected area   [DISCONTINUED] atorvastatin  (LIPITOR) 80 MG tablet Take 1 tablet (80 mg total) by mouth at bedtime.   [DISCONTINUED] Azelastine  HCl 137 MCG/SPRAY SOLN Place 1 spray into the nose daily.   [DISCONTINUED] BREO ELLIPTA  100-25 MCG/ACT AEPB Inhale 1 puff by mouth once daily   [DISCONTINUED] fluticasone  (FLONASE ) 50 MCG/ACT nasal spray Place 1 spray into both nostrils daily as needed for rhinitis.   [DISCONTINUED] gabapentin  (NEURONTIN ) 400 MG capsule Take 1 capsule (400 mg total) by mouth 3 (three) times daily.   cetirizine  (ZYRTEC ) 10 MG tablet Take 1 tablet by mouth once daily (Patient not taking: Reported on 01/27/2024)   No facility-administered medications prior to visit.    Review of Systems  Constitutional:  Positive for weight loss (1 lb).  HENT:         Nasal discharge  Eyes: Negative.   Cardiovascular: Negative.   Gastrointestinal: Negative.   Genitourinary: Negative.  Musculoskeletal:  Positive for back pain.  Skin: Negative.   Neurological: Negative.   Endo/Heme/Allergies:  Positive for environmental allergies.  Psychiatric/Behavioral: Negative.         Objective:   BP 124/76   Pulse 81   Temp 97.9 F (36.6 C)   Ht 5' 11 (1.803 m)   Wt 215 lb 3.2 oz  (97.6 kg)   SpO2 94%   BMI 30.01 kg/m   Vitals:   01/27/24 1125  BP: 124/76  Pulse: 81  Temp: 97.9 F (36.6 C)  Height: 5' 11 (1.803 m)  Weight: 215 lb 3.2 oz (97.6 kg)  SpO2: 94%  BMI (Calculated): 30.03    Physical Exam Vitals reviewed.  Constitutional:      Appearance: Normal appearance.  HENT:     Head: Normocephalic.     Left Ear: There is no impacted cerumen.     Nose: Nose normal.     Mouth/Throat:     Mouth: Mucous membranes are moist.     Pharynx: No posterior oropharyngeal erythema.  Eyes:     Extraocular Movements: Extraocular movements intact.     Pupils: Pupils are equal, round, and reactive to light.  Cardiovascular:     Rate and Rhythm: Regular rhythm.     Chest Wall: PMI is not displaced.     Pulses: Normal pulses.     Heart sounds: Normal heart sounds. No murmur heard. Pulmonary:     Effort: Pulmonary effort is normal.     Breath sounds: Normal air entry. Rales (scanty basal) present. No rhonchi.  Abdominal:     General: Abdomen is flat. Bowel sounds are normal. There is no distension.     Palpations: Abdomen is soft. There is no hepatomegaly, splenomegaly or mass.     Tenderness: There is no abdominal tenderness.  Musculoskeletal:        General: Normal range of motion.     Cervical back: Normal range of motion and neck supple.     Right lower leg: No edema.     Left lower leg: No edema.  Skin:    General: Skin is warm and dry.  Neurological:     General: No focal deficit present.     Mental Status: He is alert and oriented to person, place, and time.     Cranial Nerves: No cranial nerve deficit.     Motor: No weakness.  Psychiatric:        Mood and Affect: Mood normal.        Behavior: Behavior normal.      Results for orders placed or performed in visit on 01/27/24  POCT CBG (Fasting - Glucose)  Result Value Ref Range   Glucose Fasting, POC 161 (A) 70 - 99 mg/dL        Assessment & Plan:  Omar [name] was seen today for  follow-up.  Type 2 diabetes mellitus without complication, without long-term current use of insulin  (HCC) -     POCT CBG (Fasting - Glucose) -     Lipid panel -     Hemoglobin A1c -     Comprehensive metabolic panel with GFR  Mixed hyperlipidemia -     Lipid panel -     Atorvastatin  Calcium ; Take 1 tablet (80 mg total) by mouth at bedtime.  Dispense: 90 tablet; Refill: 0  Seasonal allergic rhinitis due to pollen -     Azelastine  HCl; Place 1 spray into the nose daily.  Dispense: 30 mL; Refill: 2 -  Fluticasone  Propionate; Place 1 spray into both nostrils daily as needed for rhinitis.  Dispense: 48 mL; Refill: 0  Shortness of breath Overview: Note: Unchanged  Orders: -     Breo Ellipta ; Inhale 1 puff into the lungs daily.  Dispense: 30 each; Refill: 2  Other orders -     Gabapentin ; Take 1 capsule (400 mg total) by mouth 3 (three) times daily.  Dispense: 90 capsule; Refill: 0    Problem List Items Addressed This Visit       Respiratory   Seasonal allergic rhinitis due to pollen   Relevant Medications   Azelastine  HCl 137 MCG/SPRAY SOLN   fluticasone  (FLONASE ) 50 MCG/ACT nasal spray     Endocrine   Diabetes mellitus (HCC) - Primary   Relevant Medications   atorvastatin  (LIPITOR) 80 MG tablet   Other Relevant Orders   POCT CBG (Fasting - Glucose) (Completed)   Comprehensive metabolic panel with GFR     Other   HLD (hyperlipidemia)   Relevant Medications   atorvastatin  (LIPITOR) 80 MG tablet   Shortness of breath   Relevant Medications   BREO ELLIPTA  100-25 MCG/ACT AEPB    Return in about 3 months (around 04/28/2024) for awv with labs prior.   Total time spent: 20 minutes  Sherrill Cinderella Perry, MD  01/27/2024   This document may have been prepared by [facility] Voice Recognition software and as such may include unintentional dictation errors.

## 2024-01-31 ENCOUNTER — Encounter: Payer: Self-pay | Admitting: Internal Medicine

## 2024-02-01 ENCOUNTER — Other Ambulatory Visit: Payer: Self-pay | Admitting: Internal Medicine

## 2024-02-01 DIAGNOSIS — E119 Type 2 diabetes mellitus without complications: Secondary | ICD-10-CM

## 2024-02-09 ENCOUNTER — Other Ambulatory Visit: Payer: Self-pay | Admitting: Cardiovascular Disease

## 2024-02-12 ENCOUNTER — Other Ambulatory Visit: Payer: Self-pay | Admitting: Internal Medicine

## 2024-02-12 DIAGNOSIS — E1129 Type 2 diabetes mellitus with other diabetic kidney complication: Secondary | ICD-10-CM

## 2024-02-15 ENCOUNTER — Other Ambulatory Visit: Payer: Self-pay | Admitting: Cardiovascular Disease

## 2024-02-16 DIAGNOSIS — E1122 Type 2 diabetes mellitus with diabetic chronic kidney disease: Secondary | ICD-10-CM | POA: Diagnosis not present

## 2024-02-16 DIAGNOSIS — R809 Proteinuria, unspecified: Secondary | ICD-10-CM | POA: Diagnosis not present

## 2024-02-16 DIAGNOSIS — E785 Hyperlipidemia, unspecified: Secondary | ICD-10-CM | POA: Diagnosis not present

## 2024-02-16 DIAGNOSIS — N281 Cyst of kidney, acquired: Secondary | ICD-10-CM | POA: Diagnosis not present

## 2024-02-16 DIAGNOSIS — I129 Hypertensive chronic kidney disease with stage 1 through stage 4 chronic kidney disease, or unspecified chronic kidney disease: Secondary | ICD-10-CM | POA: Diagnosis not present

## 2024-02-16 DIAGNOSIS — N1832 Chronic kidney disease, stage 3b: Secondary | ICD-10-CM | POA: Diagnosis not present

## 2024-02-17 ENCOUNTER — Other Ambulatory Visit: Payer: Self-pay | Admitting: Internal Medicine

## 2024-02-17 ENCOUNTER — Telehealth: Payer: Self-pay

## 2024-02-17 MED ORDER — COVID-19 MRNA VAC-TRIS(PFIZER) 30 MCG/0.3ML IM SUSY
0.3000 mL | PREFILLED_SYRINGE | Freq: Once | INTRAMUSCULAR | 0 refills | Status: AC
Start: 2024-02-17 — End: 2024-02-17

## 2024-02-17 NOTE — Telephone Encounter (Signed)
 Pharmacy sent fax asking for rx to be sent to them for the covid vaccine for the patient

## 2024-02-22 ENCOUNTER — Ambulatory Visit
Admission: RE | Admit: 2024-02-22 | Discharge: 2024-02-22 | Disposition: A | Discharge status: Home / Self Care | Source: Ambulatory Visit | Attending: Oncology | Admitting: Oncology

## 2024-02-22 DIAGNOSIS — R918 Other nonspecific abnormal finding of lung field: Secondary | ICD-10-CM | POA: Diagnosis not present

## 2024-02-22 DIAGNOSIS — R911 Solitary pulmonary nodule: Secondary | ICD-10-CM | POA: Diagnosis not present

## 2024-02-23 ENCOUNTER — Other Ambulatory Visit: Payer: Self-pay | Admitting: Internal Medicine

## 2024-03-06 ENCOUNTER — Other Ambulatory Visit: Payer: Self-pay | Admitting: Internal Medicine

## 2024-03-12 ENCOUNTER — Other Ambulatory Visit: Payer: Self-pay | Admitting: Cardiovascular Disease

## 2024-03-15 DIAGNOSIS — E1122 Type 2 diabetes mellitus with diabetic chronic kidney disease: Secondary | ICD-10-CM | POA: Diagnosis not present

## 2024-03-15 DIAGNOSIS — E785 Hyperlipidemia, unspecified: Secondary | ICD-10-CM | POA: Diagnosis not present

## 2024-03-15 DIAGNOSIS — N1832 Chronic kidney disease, stage 3b: Secondary | ICD-10-CM | POA: Diagnosis not present

## 2024-03-15 DIAGNOSIS — R809 Proteinuria, unspecified: Secondary | ICD-10-CM | POA: Diagnosis not present

## 2024-03-15 DIAGNOSIS — I129 Hypertensive chronic kidney disease with stage 1 through stage 4 chronic kidney disease, or unspecified chronic kidney disease: Secondary | ICD-10-CM | POA: Diagnosis not present

## 2024-03-19 DIAGNOSIS — M0579 Rheumatoid arthritis with rheumatoid factor of multiple sites without organ or systems involvement: Secondary | ICD-10-CM | POA: Diagnosis not present

## 2024-03-19 DIAGNOSIS — Z79899 Other long term (current) drug therapy: Secondary | ICD-10-CM | POA: Diagnosis not present

## 2024-03-19 DIAGNOSIS — C9001 Multiple myeloma in remission: Secondary | ICD-10-CM | POA: Diagnosis not present

## 2024-04-04 ENCOUNTER — Other Ambulatory Visit: Payer: Self-pay | Admitting: Internal Medicine

## 2024-04-04 DIAGNOSIS — M1A40X0 Other secondary chronic gout, unspecified site, without tophus (tophi): Secondary | ICD-10-CM

## 2024-04-04 DIAGNOSIS — K219 Gastro-esophageal reflux disease without esophagitis: Secondary | ICD-10-CM

## 2024-04-06 ENCOUNTER — Other Ambulatory Visit: Payer: Self-pay | Admitting: Internal Medicine

## 2024-04-06 ENCOUNTER — Encounter: Payer: Self-pay | Admitting: Oncology

## 2024-04-08 ENCOUNTER — Other Ambulatory Visit: Payer: Self-pay | Admitting: Cardiovascular Disease

## 2024-04-10 ENCOUNTER — Encounter: Payer: Self-pay | Admitting: Podiatry

## 2024-04-10 ENCOUNTER — Ambulatory Visit: Admitting: Podiatry

## 2024-04-10 DIAGNOSIS — B351 Tinea unguium: Secondary | ICD-10-CM

## 2024-04-10 DIAGNOSIS — M79609 Pain in unspecified limb: Secondary | ICD-10-CM | POA: Diagnosis not present

## 2024-04-10 DIAGNOSIS — E1151 Type 2 diabetes mellitus with diabetic peripheral angiopathy without gangrene: Secondary | ICD-10-CM

## 2024-04-15 ENCOUNTER — Encounter: Payer: Self-pay | Admitting: Podiatry

## 2024-04-15 NOTE — Progress Notes (Signed)
  Subjective:  Patient ID: Omar [name], male    DOB: [birth year],  MRN: [id number]  Omar [name] presents to clinic today for at risk footcare. Patient has h/o diabetes, neuropathy and PAD and is seen for  and painful elongated mycotic toenails 1-5 bilaterally which are tender when wearing enclosed shoe gear. Pain is relieved with periodic professional debridement.  Chief Complaint  Patient presents with   RFC     Diabetic A1c 6.4  PCP Albina GORMAN Dine, MD 01/27/24   New problem(s): None.   PCP is Albina GORMAN Dine, MD.  No Known Allergies  Review of Systems: Negative except as noted in the HPI.  Objective: No changes noted in today's physical examination. There were no vitals filed for this visit. [name] is a pleasant 83 y.o. male in NAD. AAO x 3.  Vascular Examination: CFT <4 seconds b/l. Nonpalpable pedal pulses b/l.  Digital hair absent. Skin temperature gradient warm to cool b/l. No ischemia or gangrene. No cyanosis or clubbing noted b/l. No edema noted b/l LE.   Neurological Examination: Sensation grossly intact b/l with 10 gram monofilament. Vibratory sensation intact b/l. Pt has subjective symptoms of neuropathy.  Dermatological Examination: Pedal skin thin, shiny and atrophic b/l. No open wounds. No interdigital macerations.   Toenails 1-5 b/l thick, discolored, elongated with subungual debris and pain on dorsal palpation.   No hyperkeratotic nor porokeratotic lesions.  Musculoskeletal Examination: Muscle strength 5/5 to all lower extremity muscle groups bilaterally. HAV with bunion b/l. Hammertoe(s) right second digit.SABRA No pain, crepitus or joint limitation noted with ROM b/l LE.  Patient ambulates independently without assistive aids.  Radiographs: None  Assessment/Plan: 1. Pain due to onychomycosis of nail   2. Type II diabetes mellitus with peripheral circulatory disorder [facility])   Consent given for treatment. Patient examined. All patient's  and/or POA's questions/concerns addressed on today's visit. Toenails 1-5 b/l debrided in length and girth without incident. Continue foot and shoe inspections daily. Monitor blood glucose per PCP/Endocrinologist's recommendations. Continue soft, supportive shoe gear daily. Report any pedal injuries to medical professional. Call office if there are any questions/concerns. -Patient/POA to call should there be question/concern in the interim.   Return in about 3 months (around 07/11/2024).  Delon LITTIE Merlin, DPM      [place] LOCATION: [address] [place] [address]                   Office [phone]   [facility] LOCATION: [address] [place], [place] [address] Office [phone]

## 2024-04-19 ENCOUNTER — Other Ambulatory Visit: Payer: Self-pay | Admitting: Internal Medicine

## 2024-04-21 ENCOUNTER — Other Ambulatory Visit: Payer: Self-pay | Admitting: Internal Medicine

## 2024-04-23 ENCOUNTER — Other Ambulatory Visit: Payer: Self-pay

## 2024-04-23 DIAGNOSIS — R0602 Shortness of breath: Secondary | ICD-10-CM

## 2024-04-23 MED ORDER — BREO ELLIPTA 100-25 MCG/ACT IN AEPB
1.0000 | INHALATION_SPRAY | Freq: Every day | RESPIRATORY_TRACT | 3 refills | Status: AC
Start: 2024-04-23 — End: 2024-08-21

## 2024-04-26 ENCOUNTER — Ambulatory Visit: Admitting: Cardiovascular Disease

## 2024-04-26 ENCOUNTER — Encounter: Payer: Self-pay | Admitting: Cardiovascular Disease

## 2024-04-26 VITALS — BP 113/82 | HR 52 | Ht 71.0 in | Wt 219.0 lb

## 2024-04-26 DIAGNOSIS — I480 Paroxysmal atrial fibrillation: Secondary | ICD-10-CM

## 2024-04-26 DIAGNOSIS — I498 Other specified cardiac arrhythmias: Secondary | ICD-10-CM | POA: Diagnosis not present

## 2024-04-26 DIAGNOSIS — I7143 Infrarenal abdominal aortic aneurysm, without rupture: Secondary | ICD-10-CM | POA: Diagnosis not present

## 2024-04-26 DIAGNOSIS — I214 Non-ST elevation (NSTEMI) myocardial infarction: Secondary | ICD-10-CM | POA: Diagnosis not present

## 2024-04-26 DIAGNOSIS — E782 Mixed hyperlipidemia: Secondary | ICD-10-CM | POA: Diagnosis not present

## 2024-04-26 DIAGNOSIS — I4430 Unspecified atrioventricular block: Secondary | ICD-10-CM

## 2024-04-26 DIAGNOSIS — F17210 Nicotine dependence, cigarettes, uncomplicated: Secondary | ICD-10-CM | POA: Diagnosis not present

## 2024-04-26 DIAGNOSIS — I5031 Acute diastolic (congestive) heart failure: Secondary | ICD-10-CM | POA: Diagnosis not present

## 2024-04-26 DIAGNOSIS — R0602 Shortness of breath: Secondary | ICD-10-CM

## 2024-04-26 DIAGNOSIS — I25118 Atherosclerotic heart disease of native coronary artery with other forms of angina pectoris: Secondary | ICD-10-CM

## 2024-04-26 DIAGNOSIS — F17200 Nicotine dependence, unspecified, uncomplicated: Secondary | ICD-10-CM

## 2024-04-26 DIAGNOSIS — Z0130 Encounter for examination of blood pressure without abnormal findings: Secondary | ICD-10-CM

## 2024-04-26 DIAGNOSIS — N1832 Chronic kidney disease, stage 3b: Secondary | ICD-10-CM

## 2024-04-26 NOTE — Progress Notes (Signed)
 Cardiology Office Note   Date:  04/26/2024   ID:  Omar, [name] August 23, 1940, MRN [id number]  PCP:  Albina GORMAN Dine, MD  Cardiologist:  Denyse Bathe, MD      History of Present Illness: Omar [name] is a 83 y.o. male who presents for  Chief Complaint  Patient presents with   Follow-up    3 month follow up. SOB    Feels SOB, no chest pain.      Past Medical History:  Diagnosis Date   AAA (abdominal aortic aneurysm)    a.) s/p EVAR 11/05/2013.   Anxiety    a.) on BZO (alprazolam ) PRN   Aortic atherosclerosis    Atrial fibrillation (HCC)    a.) CHA2DS2-VASc = 6 (age x2, CHF, HTN, PVD/aortic plaque, T2DM). b.) rate/rhythm maintained without use of pharmacological intervention; chronically anticoagulated using dose reduced apixaban.   BPH (benign prostatic hyperplasia)    CHF (congestive heart failure) (HCC)    a.)  TTE 03/16/2021: EF 63%; moderate LVH; mild BAE mild to moderate pan valvular regurgitation; G1DD.   CKD (chronic kidney disease), stage III (HCC)    Colon polyp 07/07/2015   TUBULAR ADENOMA WITH AT LEAST HIGH-GRADE / Dr Jeri   COPD (chronic obstructive pulmonary disease) (HCC)    Coronary artery disease    a.) coronary CTA 01/10/2015 CA score 722.3; mod-sev LAD disease; mild LCx disease; 50-60% RCA disease.   Current use of long term anticoagulation    a.) dose reduced apixaban   Diverticulosis    Erectile dysfunction    GERD (gastroesophageal reflux disease)    Gout    Grade I diastolic dysfunction    Heart murmur    Hepatic cyst    Hepatic steatosis    Hepatitis    Hypercholesterolemia    Hypertension    Iron  deficiency anemia    Migraines    Moderate tricuspid regurgitation by prior echocardiogram    Multiple myeloma (HCC)    Nephrolithiasis    OSA on CPAP    Presence of permanent cardiac pacemaker    Prostate cancer (HCC) 01/01/13, 01/30/14   Gleason 3+4=7, volume 46.6 cc   PVD (peripheral vascular disease)    Renal cyst     Rheumatoid arthritis (HCC)    S/P radiation therapy  04/03/2014 through 06/04/2014                                                      Prostate 7800 cGy in 40 sessions                           SSS (sick sinus syndrome) (HCC)    a.) s/p Medtronic Azure PPM placement 08/26/2016   T2DM (type 2 diabetes mellitus) (HCC)    Third degree heart block (HCC) 08/26/2016   a.) s/p PPM placement (Medtronic Azure XT DR MRI [id number] S/N: [id number]) on 08/26/2016   Vitamin D deficiency      Past Surgical History:  Procedure Laterality Date   ABDOMINAL AORTIC ANEURYSM REPAIR  11/05/2013   CATARACT EXTRACTION W/PHACO Left 06/22/2023   Procedure: CATARACT EXTRACTION PHACO AND INTRAOCULAR LENS PLACEMENT (IOC) LEFT  CLAREON VIVITY TORIC  17.82  01:15.5;  Surgeon: Mittie Gaskin, MD;  Location: [facility];  Service: Ophthalmology;  Laterality:  Left;   CATARACT EXTRACTION W/PHACO Right 07/06/2023   Procedure: CATARACT EXTRACTION PHACO AND INTRAOCULAR LENS PLACEMENT (IOC) RIGHT DIABETIC  CLAREON VIVITY TORIC;  Surgeon: Mittie Gaskin, MD;  Location: [facility];  Service: Ophthalmology;  Laterality: Right;  17.01 1:01.9   COLON SURGERY  08/06/2015   Right hemicolectomy for tubulovillous adenoma with high-grade dysplasia.   COLONOSCOPY WITH PROPOFOL  N/A 07/07/2015   Procedure: COLONOSCOPY WITH PROPOFOL ;  Surgeon: Donnice Vaughn Manes, MD;  Location: [facility] END[facility];  Service: Endoscopy;  Laterality: N/A;   COLONOSCOPY WITH PROPOFOL  N/A 10/28/2016   Procedure: COLONOSCOPY WITH PROPOFOL ;  Surgeon: Therisa Bi, MD;  Location: [facility]COPY;  Service: Endoscopy;  Laterality: N/A;   CYSTOSCOPY W/ RETROGRADES Bilateral 11/16/2021   Procedure: CYSTOSCOPY WITH RETROGRADE PYELOGRAM;  Surgeon: Penne Knee, MD;  Location: [facility];  Service: Urology;  Laterality: Bilateral;   CYSTOSCOPY WITH BIOPSY N/A 11/16/2021   Procedure: CYSTOSCOPY WITH BLADDER BIOPSY;  Surgeon: Penne Knee, MD;   Location: [facility];  Service: Urology;  Laterality: N/A;   ESOPHAGOGASTRODUODENOSCOPY (EGD) WITH PROPOFOL  N/A 10/28/2016   Procedure: ESOPHAGOGASTRODUODENOSCOPY (EGD) WITH PROPOFOL ;  Surgeon: Therisa Bi, MD;  Location: [facility] ENDOSCOPY;  Service: Endoscopy;  Laterality: N/A;   GIVENS CAPSULE STUDY N/A 07/25/2017   Procedure: GIVENS CAPSULE STUDY;  Surgeon: Therisa Bi, MD;  Location: [facility] ENDOSCOPY;  Service: Gastroenterology;  Laterality: N/A;   GIVENS CAPSULE STUDY N/A 01/29/2019   Procedure: GIVENS CAPSULE STUDY;  Surgeon: Therisa Bi, MD;  Location: [facility] ENDOSCOPY;  Service: Gastroenterology;  Laterality: N/A;   IR BONE MARROW BIOPSY & ASPIRATION  11/23/2023   LAPAROSCOPIC RIGHT COLECTOMY Right 08/08/2015   Procedure: LAPAROSCOPIC RIGHT COLECTOMY;  Surgeon: Reyes LELON Cota, MD;  Location: [facility];  Service: General;  Laterality: Right;   NASAL SINUS SURGERY     PACEMAKER INSERTION Left 08/26/2016   Procedure: INSERTION PACEMAKER;  Surgeon: Marsa Dooms, MD;  Location: [facility];  Service: Cardiovascular;  Laterality: Left;   PROSTATE BIOPSY  01/01/13, 01/30/14   Gleason 3+3=6, vol 46.6 cc   TONSILLECTOMY     uvula surgery     for sleep apnea     Current Outpatient Medications  Medication Sig Dispense Refill   albuterol  (VENTOLIN  HFA) 108 (90 Base) MCG/ACT inhaler INHALE 1 PUFF BY MOUTH EVERY 6 HOURS AS NEEDED 9 g 0   allopurinol  (ZYLOPRIM ) 100 MG tablet Take 1 tablet by mouth once daily 90 tablet 0   ALPRAZolam  (XANAX ) 0.25 MG tablet Take by mouth 2 (two) times daily as needed.     atorvastatin  (LIPITOR) 80 MG tablet Take 1 tablet (80 mg total) by mouth at bedtime. 90 tablet 0   Azelastine  HCl 137 MCG/SPRAY SOLN Place 1 spray into the nose daily. 30 mL 2   BREO ELLIPTA  100-25 MCG/ACT AEPB Inhale 1 puff into the lungs daily. 30 each 3   Cholecalciferol (VITAMIN D3) 1.25 MG (50000 UT) CAPS Take 1 capsule by mouth once a week 12 capsule 3   ELIQUIS 2.5 MG TABS tablet Take 1 tablet  by mouth twice daily 60 tablet 0   empagliflozin  (JARDIANCE ) 10 MG TABS tablet TAKE 1 TABLET BY MOUTH ONCE DAILY BEFORE BREAKFAST 90 tablet 1   ezetimibe (ZETIA) 10 MG tablet Take 1 tablet by mouth once daily 90 tablet 0   Finerenone  (KERENDIA ) 10 MG TABS Take 1 tablet by mouth once daily 30 tablet 2   fluticasone  (FLONASE ) 50 MCG/ACT nasal spray Place 1 spray into both nostrils daily as needed for rhinitis.  48 mL 0   folic acid  (FOLVITE ) 1 MG tablet Take 1 mg by mouth daily.     gabapentin  (NEURONTIN ) 400 MG capsule Take 1 capsule (400 mg total) by mouth 3 (three) times daily. 270 capsule 1   HUMIRA PEN 40 MG/0.4ML PNKT Inject 40 mg into the skin every 14 (fourteen) days.     methotrexate  (RHEUMATREX) 2.5 MG tablet Take 10 mg by mouth once a week.     metoprolol succinate (TOPROL-XL) 25 MG 24 hr tablet Take 1 tablet by mouth once daily 90 tablet 0   mupirocin  ointment (BACTROBAN ) 2 % APPLY TO RIGHT GREAT TOE ONCE DAILY UNTIL HEALED 66 g 0   pantoprazole  (PROTONIX ) 20 MG tablet Take 1 tablet by mouth once daily 90 tablet 0   tamsulosin  (FLOMAX ) 0.4 MG CAPS capsule Take 1 capsule (0.4 mg total) by mouth daily. 90 capsule 3   traMADol -acetaminophen  (ULTRACET ) 37.5-325 MG tablet TAKE 1 TABLET BY MOUTH EVERY 6 HOURS AS NEEDED FOR PAIN 30 tablet 0   triamcinolone  cream (KENALOG ) 0.1 % Apply 1 Application topically 2 (two) times daily. to affected area     No current facility-administered medications for this visit.    Allergies:   Patient has no known allergies.    Social History:   reports that he has been smoking cigarettes. He started smoking about 61 years ago. He has a 61.9 pack-year smoking history. He has never used smokeless tobacco. He reports current alcohol use of about 1.0 standard drink of alcohol per week. He reports that he does not use drugs.   Family History:  family history includes Alzheimer's disease in his sister; Cancer in his daughter; Cirrhosis in his father; Diabetes in  his brother and daughter; Heart attack in his mother; Pancreatic cancer in his brother.    ROS:     Review of Systems  Constitutional: Negative.   HENT: Negative.    Eyes: Negative.   Respiratory: Negative.    Gastrointestinal: Negative.   Genitourinary: Negative.   Musculoskeletal: Negative.   Skin: Negative.   Neurological: Negative.   Endo/Heme/Allergies: Negative.   Psychiatric/Behavioral: Negative.    All other systems reviewed and are negative.     All other systems are reviewed and negative.    PHYSICAL EXAM: VS:  BP 113/82   Pulse (!) 52   Ht 5' 11 (1.803 m)   Wt 219 lb (99.3 kg)   SpO2 99%   BMI 30.54 kg/m  , BMI Body mass index is 30.54 kg/m. Last weight:  Wt Readings from Last 3 Encounters:  04/26/24 219 lb (99.3 kg)  01/27/24 215 lb 3.2 oz (97.6 kg)  01/24/24 216 lb 3.2 oz (98.1 kg)     Physical Exam Vitals reviewed.  Constitutional:      Appearance: Normal appearance. He is normal weight.  HENT:     Head: Normocephalic.     Nose: Nose normal.     Mouth/Throat:     Mouth: Mucous membranes are moist.  Eyes:     Pupils: Pupils are equal, round, and reactive to light.  Cardiovascular:     Rate and Rhythm: Normal rate and regular rhythm.     Pulses: Normal pulses.     Heart sounds: Normal heart sounds.  Pulmonary:     Effort: Pulmonary effort is normal.  Abdominal:     General: Abdomen is flat. Bowel sounds are normal.  Musculoskeletal:        General: Normal range of motion.  Cervical back: Normal range of motion.  Skin:    General: Skin is warm.  Neurological:     General: No focal deficit present.     Mental Status: He is alert.  Psychiatric:        Mood and Affect: Mood normal.       EKG:   Recent Labs: 11/23/2023: Hemoglobin 16.0; Platelets 243 01/19/2024: ALT 14; BUN 20; Creatinine, Ser 2.21; Potassium 4.7; Sodium 139    Lipid Panel    Component Value Date/Time   CHOL 108 01/19/2024 1334   TRIG 86 01/19/2024 1334    HDL 39 (L) 01/19/2024 1334   CHOLHDL 2.8 01/19/2024 1334   CHOLHDL 3.4 08/22/2016 0447   VLDL 13 08/22/2016 0447   LDLCALC 52 01/19/2024 1334      Other studies Reviewed: Additional studies/ records that were reviewed today include:  Review of the above records demonstrates:       No data to display            ASSESSMENT AND PLAN:    ICD-10-CM   1. Infrarenal abdominal aortic aneurysm (AAA) without rupture  I71.43 PCV ECHOCARDIOGRAM COMPLETE    MYOCARDIAL PERFUSION IMAGING    2. Acute diastolic CHF (congestive heart failure) (HCC)  I50.31 PCV ECHOCARDIOGRAM COMPLETE    MYOCARDIAL PERFUSION IMAGING    3. Acute subendocardial infarction, subsequent episode of care (HCC)  I21.4 PCV ECHOCARDIOGRAM COMPLETE    MYOCARDIAL PERFUSION IMAGING    4. Paroxysmal atrial fibrillation (HCC)  I48.0 PCV ECHOCARDIOGRAM COMPLETE    MYOCARDIAL PERFUSION IMAGING    5. AVB (atrioventricular block)  I44.30 PCV ECHOCARDIOGRAM COMPLETE    MYOCARDIAL PERFUSION IMAGING    6. Bradyarrhythmia  I49.8 PCV ECHOCARDIOGRAM COMPLETE    MYOCARDIAL PERFUSION IMAGING    7. Tobacco use disorder  F17.200 PCV ECHOCARDIOGRAM COMPLETE    MYOCARDIAL PERFUSION IMAGING    8. Mixed hyperlipidemia  E78.2 PCV ECHOCARDIOGRAM COMPLETE    MYOCARDIAL PERFUSION IMAGING    9. Shortness of breath  R06.02 PCV ECHOCARDIOGRAM COMPLETE    MYOCARDIAL PERFUSION IMAGING   LVEF 45%, not ARBS, check echo    10. CKD stage 3b, GFR 30-44 ml/min (HCC)  N18.32 PCV ECHOCARDIOGRAM COMPLETE    MYOCARDIAL PERFUSION IMAGING   creat was 2.2 thus renal stopped ARBS.    11. Coronary artery disease of native artery of native heart with stable angina pectoris  I25.118 PCV ECHOCARDIOGRAM COMPLETE    MYOCARDIAL PERFUSION IMAGING   Had Moderate to severe LAD, will do stress test. and echo       Problem List Items Addressed This Visit       Cardiovascular and Mediastinum   Atrial fibrillation (HCC)   Relevant Orders   PCV  ECHOCARDIOGRAM COMPLETE   MYOCARDIAL PERFUSION IMAGING   Acute diastolic CHF (congestive heart failure) (HCC)   Relevant Orders   PCV ECHOCARDIOGRAM COMPLETE   MYOCARDIAL PERFUSION IMAGING   AVB (atrioventricular block)   Relevant Orders   PCV ECHOCARDIOGRAM COMPLETE   MYOCARDIAL PERFUSION IMAGING   AAA (abdominal aortic aneurysm) without rupture - Primary   Relevant Orders   PCV ECHOCARDIOGRAM COMPLETE   MYOCARDIAL PERFUSION IMAGING   Acute subendocardial infarction, subsequent episode of care (HCC)   Relevant Orders   PCV ECHOCARDIOGRAM COMPLETE   MYOCARDIAL PERFUSION IMAGING   Bradyarrhythmia   Relevant Orders   PCV ECHOCARDIOGRAM COMPLETE   MYOCARDIAL PERFUSION IMAGING     Other   Tobacco use disorder (Chronic)   Relevant Orders  PCV ECHOCARDIOGRAM COMPLETE   MYOCARDIAL PERFUSION IMAGING   HLD (hyperlipidemia)   Relevant Orders   PCV ECHOCARDIOGRAM COMPLETE   MYOCARDIAL PERFUSION IMAGING   Shortness of breath   Relevant Orders   PCV ECHOCARDIOGRAM COMPLETE   MYOCARDIAL PERFUSION IMAGING   Other Visit Diagnoses       CKD stage 3b, GFR 30-44 ml/min (HCC)       creat was 2.2 thus renal stopped ARBS.   Relevant Orders   PCV ECHOCARDIOGRAM COMPLETE   MYOCARDIAL PERFUSION IMAGING     Coronary artery disease of native artery of native heart with stable angina pectoris       Had Moderate to severe LAD, will do stress test. and echo   Relevant Orders   PCV ECHOCARDIOGRAM COMPLETE   MYOCARDIAL PERFUSION IMAGING          Disposition:   Return in about 5 weeks (around 05/31/2024) for echo, stress test and f/u.    Total time spent: 35 minutes  Signed,  Denyse Bathe, MD  04/26/2024 10:07 AM    [facility]

## 2024-05-05 ENCOUNTER — Other Ambulatory Visit: Payer: Self-pay | Admitting: Cardiovascular Disease

## 2024-05-05 ENCOUNTER — Other Ambulatory Visit: Payer: Self-pay | Admitting: Internal Medicine

## 2024-05-05 DIAGNOSIS — E782 Mixed hyperlipidemia: Secondary | ICD-10-CM

## 2024-05-06 ENCOUNTER — Other Ambulatory Visit: Payer: Self-pay | Admitting: Cardiovascular Disease

## 2024-05-07 ENCOUNTER — Ambulatory Visit: Admitting: Internal Medicine

## 2024-05-07 ENCOUNTER — Encounter: Payer: Self-pay | Admitting: Internal Medicine

## 2024-05-07 VITALS — BP 130/90 | HR 69 | Ht 71.0 in | Wt 217.0 lb

## 2024-05-07 DIAGNOSIS — Z0130 Encounter for examination of blood pressure without abnormal findings: Secondary | ICD-10-CM

## 2024-05-07 DIAGNOSIS — R0789 Other chest pain: Secondary | ICD-10-CM | POA: Diagnosis not present

## 2024-05-07 DIAGNOSIS — E119 Type 2 diabetes mellitus without complications: Secondary | ICD-10-CM

## 2024-05-07 DIAGNOSIS — Z1389 Encounter for screening for other disorder: Secondary | ICD-10-CM

## 2024-05-07 DIAGNOSIS — Z739 Problem related to life management difficulty, unspecified: Secondary | ICD-10-CM

## 2024-05-07 DIAGNOSIS — Z1331 Encounter for screening for depression: Secondary | ICD-10-CM | POA: Diagnosis not present

## 2024-05-07 DIAGNOSIS — E782 Mixed hyperlipidemia: Secondary | ICD-10-CM | POA: Diagnosis not present

## 2024-05-07 DIAGNOSIS — Z0001 Encounter for general adult medical examination with abnormal findings: Secondary | ICD-10-CM

## 2024-05-07 LAB — HEMOGLOBIN A1C
Est. average glucose Bld gHb Est-mCnc: 137 mg/dL
Hgb A1c MFr Bld: 6.4 % — ABNORMAL HIGH (ref 4.8–5.6)

## 2024-05-07 LAB — POCT CBG (FASTING - GLUCOSE)-MANUAL ENTRY: Glucose Fasting, POC: 86 mg/dL (ref 70–99)

## 2024-05-07 MED ORDER — ATORVASTATIN CALCIUM 80 MG PO TABS: 80.0000 mg | ORAL_TABLET | Freq: Every day | ORAL | 0 refills | Status: AC

## 2024-05-07 MED ORDER — TRAMADOL-ACETAMINOPHEN 37.5-325 MG PO TABS: 1.0000 | ORAL_TABLET | Freq: Four times a day (QID) | ORAL | 0 refills | Status: AC | PRN

## 2024-05-07 NOTE — Progress Notes (Signed)
 Established Patient Office Visit  Subjective:  Patient ID: Omar [name], male    DOB: 10-29-1940  Age: 83 y.o. MRN: [id number]  Chief Complaint  Patient presents with   Results    AWV lab results     No new complaints, here for AWV refer to quality metrics and scanned documents. Fasting glucose normal.    No other concerns at this time.   Past Medical History:  Diagnosis Date   AAA (abdominal aortic aneurysm)    a.) Darryon Bastin/p EVAR 11/05/2013.   Anxiety    a.) on BZO (alprazolam ) PRN   Aortic atherosclerosis    Atrial fibrillation (HCC)    a.) CHA2DS2-VASc = 6 (age x2, CHF, HTN, PVD/aortic plaque, T2DM). b.) rate/rhythm maintained without use of pharmacological intervention; chronically anticoagulated using dose reduced apixaban.   BPH (benign prostatic hyperplasia)    CHF (congestive heart failure) (HCC)    a.)  TTE 03/16/2021: EF 63%; moderate LVH; mild BAE mild to moderate pan valvular regurgitation; G1DD.   CKD (chronic kidney disease), stage III (HCC)    Colon polyp 07/07/2015   TUBULAR ADENOMA WITH AT LEAST HIGH-GRADE / Dr Jeri   COPD (chronic obstructive pulmonary disease) (HCC)    Coronary artery disease    a.) coronary CTA 01/10/2015 CA score 722.3; mod-sev LAD disease; mild LCx disease; 50-60% RCA disease.   Current use of long term anticoagulation    a.) dose reduced apixaban   Diverticulosis    Erectile dysfunction    GERD (gastroesophageal reflux disease)    Gout    Grade I diastolic dysfunction    Heart murmur    Hepatic cyst    Hepatic steatosis    Hepatitis    Hypercholesterolemia    Hypertension    Iron  deficiency anemia    Migraines    Moderate tricuspid regurgitation by prior echocardiogram    Multiple myeloma (HCC)    Nephrolithiasis    OSA on CPAP    Presence of permanent cardiac pacemaker    Prostate cancer (HCC) 01/01/13, 01/30/14   Gleason 3+4=7, volume 46.6 cc   PVD (peripheral vascular disease)    Renal cyst    Rheumatoid arthritis  (HCC)    Arliss Hepburn/P radiation therapy  04/03/2014 through 06/04/2014                                                      Prostate 7800 cGy in 40 sessions                           SSS (sick sinus syndrome) (HCC)    a.) Sonja Manseau/p Medtronic Azure PPM placement 08/26/2016   T2DM (type 2 diabetes mellitus) (HCC)    Third degree heart block (HCC) 08/26/2016   a.) Terrea Bruster/p PPM placement (Medtronic Azure XT DR MRI [id number] Marieke Lubke/N: [id number]) on 08/26/2016   Vitamin D deficiency     Past Surgical History:  Procedure Laterality Date   ABDOMINAL AORTIC ANEURYSM REPAIR  11/05/2013   CATARACT EXTRACTION W/PHACO Left 06/22/2023   Procedure: CATARACT EXTRACTION PHACO AND INTRAOCULAR LENS PLACEMENT (IOC) LEFT  CLAREON VIVITY TORIC  17.82  01:15.5;  Surgeon: Mittie Gaskin, MD;  Location: [facility];  Service: Ophthalmology;  Laterality: Left;   CATARACT EXTRACTION W/PHACO Right 07/06/2023   Procedure: CATARACT EXTRACTION PHACO  AND INTRAOCULAR LENS PLACEMENT (IOC) RIGHT DIABETIC  CLAREON [place] GREGO;  Surgeon: Mittie Gaskin, MD;  Location: [facility];  Service: Ophthalmology;  Laterality: Right;  17.01 1:01.9   COLON SURGERY  08/06/2015   Right hemicolectomy for tubulovillous adenoma with high-grade dysplasia.   COLONOSCOPY WITH PROPOFOL  N/A 07/07/2015   Procedure: COLONOSCOPY WITH PROPOFOL ;  Surgeon: Donnice Vaughn Manes, MD;  Location[facility];  Service: Endoscopy;  Laterality: N/A;   COLONOSCOPY WITH PROPOFOL  N/A 10/28/2016   Procedure: COLONOSCOPY WITH PROPOFOL ;  Surgeon: Therisa Bi, MD;  Location: [facility] ENDOSCOPY;  Service: Endoscopy;  Laterality: N/A;   CYSTOSCOPY W/ RETROGRADES Bilateral 11/16/2021   Procedure: CYSTOSCOPY WITH RETROGRADE PYELOGRAM;  Surgeon: Penne Knee, MD;  Location: [facility];  Service: Urology;  Laterality: Bilateral;   CYSTOSCOPY WITH BIOPSY N/A 11/16/2021   Procedure: CYSTOSCOPY WITH BLADDER BIOPSY;  Surgeon: Penne Knee, MD;  Location: [facility];   Service: Urology;  Laterality: N/A;   ESOPHAGOGASTRODUODENOSCOPY (EGD) WITH PROPOFOL  N/A 10/28/2016   Procedure: ESOPHAGOGASTRODUODENOSCOPY (EGD) WITH PROPOFOL ;  Surgeon: Therisa Bi, MD;  Location: [facility] ENDOSCOPY;  Service: Endoscopy;  Laterality: N/A;   GIVENS CAPSULE STUDY N/A 07/25/2017   Procedure: GIVENS CAPSULE STUDY;  Surgeon: Therisa Bi, MD;  Location: [facility] ENDOSCOPY;  Service: Gastroenterology;  Laterality: N/A;   GIVENS CAPSULE STUDY N/A 01/29/2019   Procedure: GIVENS CAPSULE STUDY;  Surgeon: Therisa Bi, MD;  Location: [facility] ENDOSCOPY;  Service: Gastroenterology;  Laterality: N/A;   IR BONE MARROW BIOPSY & ASPIRATION  11/23/2023   LAPAROSCOPIC RIGHT COLECTOMY Right 08/08/2015   Procedure: LAPAROSCOPIC RIGHT COLECTOMY;  Surgeon: Reyes LELON Cota, MD;  Location: [facility];  Service: General;  Laterality: Right;   NASAL SINUS SURGERY     PACEMAKER INSERTION Left 08/26/2016   Procedure: INSERTION PACEMAKER;  Surgeon: Marsa Dooms, MD;  Location: [facility];  Service: Cardiovascular;  Laterality: Left;   PROSTATE BIOPSY  01/01/13, 01/30/14   Gleason 3+3=6, vol 46.6 cc   TONSILLECTOMY     uvula surgery     for sleep apnea    Social History   Socioeconomic History   Marital status: Divorced    Spouse name: Not on file   Number of children: Not on file   Years of education: Not on file   Highest education level: Not on file  Occupational History   Not on file  Tobacco Use   Smoking status: Every Day    Current packs/day: 1.00    Average packs/day: 1 pack/day for 61.9 years (61.9 ttl pk-yrs)    Types: Cigarettes    Start date: 1964   Smokeless tobacco: Never   Tobacco comments:    DOWN TO 1/2 PPD  Vaping Use   Vaping status: Never Used  Substance and Sexual Activity   Alcohol use: Yes    Alcohol/week: 1.0 standard drink of alcohol    Types: 1 Cans of beer per week    Comment: occasionally   Drug use: No   Sexual activity: Not Currently  Other Topics Concern   Not  on file  Social History Narrative   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   Social Drivers of Health   Financial Resource Strain: Not on file  Food Insecurity: No Food Insecurity (09/14/2022)   Hunger Vital Sign    Worried About Running Out of Food in the Last Year: Never true    Ran Out of Food in the Last Year: Never true  Transportation Needs: No Transportation Needs (09/14/2022)   PRAPARE -  Administrator, Civil Service (Medical): No    Lack of Transportation (Non-Medical): No  Physical Activity: Not on file  Stress: Not on file  Social Connections: Not on file  Intimate Partner Violence: Not on file    Family History  Problem Relation Age of Onset   Heart attack Mother    Cirrhosis Father    Alzheimer'Arfa Lamarca disease Sister    Pancreatic cancer Brother    Diabetes Brother    Diabetes Daughter        medication induced for cancer treatments   Cancer Daughter        breast, brain    No Known Allergies  Outpatient Medications Prior to Visit  Medication Sig   albuterol  (VENTOLIN  HFA) 108 (90 Base) MCG/ACT inhaler INHALE 1 PUFF BY MOUTH EVERY 6 HOURS AS NEEDED   allopurinol  (ZYLOPRIM ) 100 MG tablet Take 1 tablet by mouth once daily   ALPRAZolam  (XANAX ) 0.25 MG tablet Take by mouth 2 (two) times daily as needed.   Azelastine  HCl 137 MCG/SPRAY SOLN Place 1 spray into the nose daily.   BREO ELLIPTA  100-25 MCG/ACT AEPB Inhale 1 puff into the lungs daily.   Cholecalciferol (VITAMIN D3) 1.25 MG (50000 UT) CAPS Take 1 capsule by mouth once a week   ELIQUIS 2.5 MG TABS tablet Take 1 tablet by mouth twice daily   empagliflozin  (JARDIANCE ) 10 MG TABS tablet TAKE 1 TABLET BY MOUTH ONCE DAILY BEFORE BREAKFAST   ezetimibe (ZETIA) 10 MG tablet Take 1 tablet by mouth once daily   Finerenone  (KERENDIA ) 10 MG TABS Take 1 tablet by mouth once daily   fluticasone  (FLONASE ) 50 MCG/ACT nasal spray Place 1 spray into both nostrils daily as needed for rhinitis.   folic acid  (FOLVITE ) 1 MG  tablet Take 1 mg by mouth daily.   gabapentin  (NEURONTIN ) 400 MG capsule Take 1 capsule (400 mg total) by mouth 3 (three) times daily.   HUMIRA PEN 40 MG/0.4ML PNKT Inject 40 mg into the skin every 14 (fourteen) days.   methotrexate  (RHEUMATREX) 2.5 MG tablet Take 10 mg by mouth once a week.   metoprolol succinate (TOPROL-XL) 25 MG 24 hr tablet Take 1 tablet by mouth once daily   mupirocin  ointment (BACTROBAN ) 2 % APPLY TO RIGHT GREAT TOE ONCE DAILY UNTIL HEALED   pantoprazole  (PROTONIX ) 20 MG tablet Take 1 tablet by mouth once daily   tamsulosin  (FLOMAX ) 0.4 MG CAPS capsule Take 1 capsule (0.4 mg total) by mouth daily.   triamcinolone  cream (KENALOG ) 0.1 % Apply 1 Application topically 2 (two) times daily. to affected area   [DISCONTINUED] atorvastatin  (LIPITOR) 80 MG tablet Take 1 tablet (80 mg total) by mouth at bedtime.   [DISCONTINUED] traMADol -acetaminophen  (ULTRACET ) 37.5-325 MG tablet TAKE 1 TABLET BY MOUTH EVERY 6 HOURS AS NEEDED FOR PAIN   No facility-administered medications prior to visit.    Review of Systems  Constitutional:  Negative for weight loss (3 lbs).  HENT:         Nasal discharge  Eyes: Negative.   Cardiovascular: Negative.   Gastrointestinal: Negative.   Genitourinary: Negative.   Musculoskeletal:  Positive for back pain.  Skin: Negative.   Neurological: Negative.   Endo/Heme/Allergies:  Positive for environmental allergies.  Psychiatric/Behavioral: Negative.         Objective:   BP (!) 130/90   Pulse 69   Ht 5' 11 (1.803 m)   Wt 217 lb (98.4 kg)   SpO2 99%   BMI 30.27  kg/m   Vitals:   05/07/24 1131  BP: (!) 130/90  Pulse: 69  Height: 5' 11 (1.803 m)  Weight: 217 lb (98.4 kg)  SpO2: 99%  BMI (Calculated): 30.28    Physical Exam Vitals reviewed.  Constitutional:      Appearance: Normal appearance.  HENT:     Head: Normocephalic.     Left Ear: There is no impacted cerumen.     Nose: Nose normal.     Mouth/Throat:     Mouth: Mucous  membranes are moist.     Pharynx: No posterior oropharyngeal erythema.  Eyes:     Extraocular Movements: Extraocular movements intact.     Pupils: Pupils are equal, round, and reactive to light.  Cardiovascular:     Rate and Rhythm: Regular rhythm.     Chest Wall: PMI is not displaced.     Pulses: Normal pulses.     Heart sounds: Normal heart sounds. No murmur heard. Pulmonary:     Effort: Pulmonary effort is normal.     Breath sounds: Normal air entry. Rales (scanty basal) present. No rhonchi.  Abdominal:     General: Abdomen is flat. Bowel sounds are normal. There is no distension.     Palpations: Abdomen is soft. There is no hepatomegaly, splenomegaly or mass.     Tenderness: There is no abdominal tenderness.  Musculoskeletal:        General: Normal range of motion.     Cervical back: Normal range of motion and neck supple.     Right lower leg: No edema.     Left lower leg: No edema.  Skin:    General: Skin is warm and dry.  Neurological:     General: No focal deficit present.     Mental Status: He is alert and oriented to person, place, and time.     Cranial Nerves: No cranial nerve deficit.     Motor: No weakness.  Psychiatric:        Mood and Affect: Mood normal.        Behavior: Behavior normal.      Results for orders placed or performed in visit on 05/07/24  POCT CBG (Fasting - Glucose)  Result Value Ref Range   Glucose Fasting, POC 86 70 - 99 mg/dL    Recent Results (from the past 2160 hours)  POCT CBG (Fasting - Glucose)     Status: None   Collection Time: 05/07/24 11:35 AM  Result Value Ref Range   Glucose Fasting, POC 86 70 - 99 mg/dL      Assessment & Plan:   Problem List Items Addressed This Visit       Endocrine   Diabetes mellitus (HCC) - Primary   Relevant Medications   atorvastatin  (LIPITOR) 80 MG tablet   Other Relevant Orders   POCT CBG (Fasting - Glucose) (Completed)     Other   HLD (hyperlipidemia)   Relevant Medications    atorvastatin  (LIPITOR) 80 MG tablet   Other Visit Diagnoses       Other chest pain       Relevant Medications   traMADol -acetaminophen  (ULTRACET ) 37.5-325 MG tablet       Return in about 2 weeks (around 05/21/2024) for memory eval.   Total time spent: 30 minutes. This time includes review of previous notes and results and patient face to face interaction during today'Edgardo Petrenko visit.    Sherrill Cinderella Perry, MD  05/07/2024   This document may have been prepared by  Conservation Officer, [employer] and as such may include unintentional dictation errors.

## 2024-05-08 LAB — LIPID PANEL
Chol/HDL Ratio: 2.5 ratio (ref 0.0–5.0)
Cholesterol, Total: 121 mg/dL (ref 100–199)
HDL: 48 mg/dL (ref >39)
LDL Chol Calc (NIH): 59 mg/dL (ref 0–99)
Triglycerides: 68 mg/dL (ref 0–149)
VLDL Cholesterol Cal: 14 mg/dL (ref 5–40)

## 2024-05-09 ENCOUNTER — Other Ambulatory Visit: Payer: Self-pay

## 2024-05-10 ENCOUNTER — Other Ambulatory Visit: Payer: Self-pay

## 2024-05-11 MED ORDER — COLCHICINE 0.6 MG PO TABS: 0.6000 mg | ORAL_TABLET | Freq: Three times a day (TID) | ORAL | 0 refills | Status: DC

## 2024-05-12 ENCOUNTER — Other Ambulatory Visit: Payer: Self-pay | Admitting: Internal Medicine

## 2024-05-12 DIAGNOSIS — E1129 Type 2 diabetes mellitus with other diabetic kidney complication: Secondary | ICD-10-CM

## 2024-05-21 ENCOUNTER — Ambulatory Visit

## 2024-05-21 DIAGNOSIS — N1832 Chronic kidney disease, stage 3b: Secondary | ICD-10-CM

## 2024-05-21 DIAGNOSIS — I5031 Acute diastolic (congestive) heart failure: Secondary | ICD-10-CM

## 2024-05-21 DIAGNOSIS — I4430 Unspecified atrioventricular block: Secondary | ICD-10-CM

## 2024-05-21 DIAGNOSIS — I498 Other specified cardiac arrhythmias: Secondary | ICD-10-CM

## 2024-05-21 DIAGNOSIS — F17200 Nicotine dependence, unspecified, uncomplicated: Secondary | ICD-10-CM

## 2024-05-21 DIAGNOSIS — I25118 Atherosclerotic heart disease of native coronary artery with other forms of angina pectoris: Secondary | ICD-10-CM

## 2024-05-21 DIAGNOSIS — R0602 Shortness of breath: Secondary | ICD-10-CM

## 2024-05-21 DIAGNOSIS — E782 Mixed hyperlipidemia: Secondary | ICD-10-CM

## 2024-05-21 DIAGNOSIS — I7143 Infrarenal abdominal aortic aneurysm, without rupture: Secondary | ICD-10-CM

## 2024-05-21 DIAGNOSIS — I214 Non-ST elevation (NSTEMI) myocardial infarction: Secondary | ICD-10-CM

## 2024-05-21 DIAGNOSIS — I480 Paroxysmal atrial fibrillation: Secondary | ICD-10-CM

## 2024-05-21 MED ORDER — TECHNETIUM TC 99M SESTAMIBI GENERIC - CARDIOLITE
30.4000 | Freq: Once | INTRAVENOUS | Status: AC | PRN
  Administered 2024-05-21: 14:00:00 30.4 via INTRAVENOUS

## 2024-05-21 MED ORDER — TECHNETIUM TC 99M SESTAMIBI GENERIC - CARDIOLITE
10.7000 | Freq: Once | INTRAVENOUS | Status: AC | PRN
  Administered 2024-05-21: 12:00:00 10.7 via INTRAVENOUS

## 2024-05-22 ENCOUNTER — Ambulatory Visit

## 2024-05-22 ENCOUNTER — Other Ambulatory Visit: Payer: Self-pay

## 2024-05-22 DIAGNOSIS — R0602 Shortness of breath: Secondary | ICD-10-CM

## 2024-05-22 DIAGNOSIS — I498 Other specified cardiac arrhythmias: Secondary | ICD-10-CM

## 2024-05-22 DIAGNOSIS — I7143 Infrarenal abdominal aortic aneurysm, without rupture: Secondary | ICD-10-CM

## 2024-05-22 DIAGNOSIS — I214 Non-ST elevation (NSTEMI) myocardial infarction: Secondary | ICD-10-CM

## 2024-05-22 DIAGNOSIS — I25118 Atherosclerotic heart disease of native coronary artery with other forms of angina pectoris: Secondary | ICD-10-CM

## 2024-05-22 DIAGNOSIS — F17200 Nicotine dependence, unspecified, uncomplicated: Secondary | ICD-10-CM

## 2024-05-22 DIAGNOSIS — I480 Paroxysmal atrial fibrillation: Secondary | ICD-10-CM

## 2024-05-22 DIAGNOSIS — I5031 Acute diastolic (congestive) heart failure: Secondary | ICD-10-CM

## 2024-05-22 DIAGNOSIS — N1832 Chronic kidney disease, stage 3b: Secondary | ICD-10-CM

## 2024-05-22 DIAGNOSIS — E782 Mixed hyperlipidemia: Secondary | ICD-10-CM

## 2024-05-22 DIAGNOSIS — I4430 Unspecified atrioventricular block: Secondary | ICD-10-CM

## 2024-05-23 ENCOUNTER — Ambulatory Visit: Admitting: Internal Medicine

## 2024-05-23 ENCOUNTER — Other Ambulatory Visit: Payer: Self-pay

## 2024-05-23 ENCOUNTER — Telehealth: Payer: Self-pay

## 2024-05-23 NOTE — Telephone Encounter (Signed)
 [facility] called regarding a refill for the pt. I tried calling back but could not get ahold of anyone at this time.

## 2024-05-24 ENCOUNTER — Other Ambulatory Visit: Payer: Self-pay

## 2024-05-24 ENCOUNTER — Telehealth: Payer: Self-pay

## 2024-05-24 MED ORDER — COLCHICINE 0.6 MG PO TABS: 0.6000 mg | ORAL_TABLET | Freq: Three times a day (TID) | ORAL | 0 refills | Status: DC

## 2024-05-24 NOTE — Telephone Encounter (Signed)
 Pt's pharmacy called to inform us  that pt's colchicine  refill has two different sets of instructions. Pharmacy needs a new refill sent in with correct instructions.

## 2024-05-24 NOTE — Telephone Encounter (Signed)
 Rx was sent on 12/5 but I have reissued the rx

## 2024-05-25 MED ORDER — COLCHICINE 0.6 MG PO TABS: ORAL_TABLET | ORAL | 0 refills | Status: AC

## 2024-05-29 ENCOUNTER — Ambulatory Visit: Admitting: Cardiovascular Disease

## 2024-05-29 ENCOUNTER — Encounter: Payer: Self-pay | Admitting: Cardiovascular Disease

## 2024-05-29 VITALS — BP 127/81 | HR 54 | Ht 71.0 in | Wt 222.8 lb

## 2024-05-29 DIAGNOSIS — R0602 Shortness of breath: Secondary | ICD-10-CM

## 2024-05-29 DIAGNOSIS — I5043 Acute on chronic combined systolic (congestive) and diastolic (congestive) heart failure: Secondary | ICD-10-CM

## 2024-05-29 DIAGNOSIS — I480 Paroxysmal atrial fibrillation: Secondary | ICD-10-CM

## 2024-05-29 DIAGNOSIS — I25118 Atherosclerotic heart disease of native coronary artery with other forms of angina pectoris: Secondary | ICD-10-CM | POA: Diagnosis not present

## 2024-05-29 DIAGNOSIS — F17200 Nicotine dependence, unspecified, uncomplicated: Secondary | ICD-10-CM | POA: Diagnosis not present

## 2024-05-29 DIAGNOSIS — Z0130 Encounter for examination of blood pressure without abnormal findings: Secondary | ICD-10-CM

## 2024-05-29 DIAGNOSIS — N1832 Chronic kidney disease, stage 3b: Secondary | ICD-10-CM

## 2024-05-29 DIAGNOSIS — E782 Mixed hyperlipidemia: Secondary | ICD-10-CM

## 2024-05-29 MED ORDER — FUROSEMIDE 20 MG PO TABS: 20.0000 mg | ORAL_TABLET | Freq: Every day | ORAL | 11 refills | Status: AC

## 2024-05-29 NOTE — Progress Notes (Signed)
 "     Cardiology Office Note   Date:  05/29/2024   ID:  Keedan, Sample 1940/12/29, MRN [id number]  PCP:  Albina GORMAN Dine, MD  Cardiologist:  Denyse Bathe, MD      History of Present Illness: Omar [name] is a 83 y.o. male who presents for  Chief Complaint  Patient presents with   Follow-up    NST & Echo follow up    Still SOB.      Past Medical History:  Diagnosis Date   AAA (abdominal aortic aneurysm)    a.) s/p EVAR 11/05/2013.   Anxiety    a.) on BZO (alprazolam ) PRN   Aortic atherosclerosis    Atrial fibrillation (HCC)    a.) CHA2DS2-VASc = 6 (age x2, CHF, HTN, PVD/aortic plaque, T2DM). b.) rate/rhythm maintained without use of pharmacological intervention; chronically anticoagulated using dose reduced apixaban.   BPH (benign prostatic hyperplasia)    CHF (congestive heart failure) (HCC)    a.)  TTE 03/16/2021: EF 63%; moderate LVH; mild BAE mild to moderate pan valvular regurgitation; G1DD.   CKD (chronic kidney disease), stage III (HCC)    Colon polyp 07/07/2015   TUBULAR ADENOMA WITH AT LEAST HIGH-GRADE / Dr Jeri   COPD (chronic obstructive pulmonary disease) (HCC)    Coronary artery disease    a.) coronary CTA 01/10/2015 CA score 722.3; mod-sev LAD disease; mild LCx disease; 50-60% RCA disease.   Current use of long term anticoagulation    a.) dose reduced apixaban   Diverticulosis    Erectile dysfunction    GERD (gastroesophageal reflux disease)    Gout    Grade I diastolic dysfunction    Heart murmur    Hepatic cyst    Hepatic steatosis    Hepatitis    Hypercholesterolemia    Hypertension    Iron  deficiency anemia    Migraines    Moderate tricuspid regurgitation by prior echocardiogram    Multiple myeloma (HCC)    Nephrolithiasis    OSA on CPAP    Presence of permanent cardiac pacemaker    Prostate cancer (HCC) 01/01/13, 01/30/14   Gleason 3+4=7, volume 46.6 cc   PVD (peripheral vascular disease)    Renal cyst    Rheumatoid  arthritis (HCC)    S/P radiation therapy  04/03/2014 through 06/04/2014                                                      Prostate 7800 cGy in 40 sessions                           SSS (sick sinus syndrome) (HCC)    a.) s/p Medtronic Azure PPM placement 08/26/2016   T2DM (type 2 diabetes mellitus) (HCC)    Third degree heart block (HCC) 08/26/2016   a.) s/p PPM placement (Medtronic Azure XT DR MRI [id number] S/N: [id number]) on 08/26/2016   Vitamin D deficiency      Past Surgical History:  Procedure Laterality Date   ABDOMINAL AORTIC ANEURYSM REPAIR  11/05/2013   CATARACT EXTRACTION W/PHACO Left 06/22/2023   Procedure: CATARACT EXTRACTION PHACO AND INTRAOCULAR LENS PLACEMENT (IOC) LEFT  CLAREON VIVITY TORIC  17.82  01:15.5;  Surgeon: Mittie Gaskin, MD;  Location: [facility];  Service: Ophthalmology;  Laterality:  Left;   CATARACT EXTRACTION W/PHACO Right 07/06/2023   Procedure: CATARACT EXTRACTION PHACO AND INTRAOCULAR LENS PLACEMENT (IOC) RIGHT DIABETIC  CLAREON VIVITY TORIC;  Surgeon: Mittie Gaskin, MD;  Location: [facility];  Service: Ophthalmology;  Laterality: Right;  17.01 1:01.9   COLON SURGERY  08/06/2015   Right hemicolectomy for tubulovillous adenoma with high-grade dysplasia.   COLONOSCOPY WITH PROPOFOL  N/A 07/07/2015   Procedure: COLONOSCOPY WITH PROPOFOL ;  Surgeon: Donnice Vaughn Manes, MD;  Location: [facility] END[facility]COPY;  Service: Endoscopy;  Laterality: N/A;   COLONOSCOPY WITH PROPOFOL  N/A 10/28/2016   Procedure: COLONOSCOPY WITH PROPOFOL ;  Surgeon: Therisa Bi, MD;  Location: [facility] ENDOSCOPY;  Service: Endoscopy;  Laterality: N/A;   CYSTOSCOPY W/ RETROGRADES Bilateral 11/16/2021   Procedure: CYSTOSCOPY WITH RETROGRADE PYELOGRAM;  Surgeon: Penne Knee, MD;  Location: [facility] ORS;  Service: Urology;  Laterality: Bilateral;   CYSTOSCOPY WITH BIOPSY N/A 11/16/2021   Procedure: CYSTOSCOPY WITH BLADDER BIOPSY;  Surgeon: Penne Knee, MD;  Location: [facility]  ORS;  Service: Urology;  Laterality: N/A;   ESOPHAGOGASTRODUODENOSCOPY (EGD) WITH PROPOFOL  N/A 10/28/2016   Procedure: ESOPHAGOGASTRODUODENOSCOPY (EGD) WITH PROPOFOL ;  Surgeon: Therisa Bi, MD;  Location: [facility] ENDOSCOPY;  Service: Endoscopy;  Laterality: N/A;   GIVENS CAPSULE STUDY N/A 07/25/2017   Procedure: GIVENS CAPSULE STUDY;  Surgeon: Therisa Bi, MD;  Location: [facility] ENDOSCOPY;  Service: Gastroenterology;  Laterality: N/A;   GIVENS CAPSULE STUDY N/A 01/29/2019   Procedure: GIVENS CAPSULE STUDY;  Surgeon: Therisa Bi, MD;  Location: [facility] ENDOSCOPY;  Service: Gastroenterology;  Laterality: N/A;   IR BONE MARROW BIOPSY & ASPIRATION  11/23/2023   LAPAROSCOPIC RIGHT COLECTOMY Right 08/08/2015   Procedure: LAPAROSCOPIC RIGHT COLECTOMY;  Surgeon: Reyes LELON Cota, MD;  Location: [facility];  Service: General;  Laterality: Right;   NASAL SINUS SURGERY     PACEMAKER INSERTION Left 08/26/2016   Procedure: INSERTION PACEMAKER;  Surgeon: Marsa Dooms, MD;  Location: [facility];  Service: Cardiovascular;  Laterality: Left;   PROSTATE BIOPSY  01/01/13, 01/30/14   Gleason 3+3=6, vol 46.6 cc   TONSILLECTOMY     uvula surgery     for sleep apnea     Current Outpatient Medications  Medication Sig Dispense Refill   albuterol  (VENTOLIN  HFA) 108 (90 Base) MCG/ACT inhaler INHALE 1 PUFF BY MOUTH EVERY 6 HOURS AS NEEDED 9 g 0   allopurinol  (ZYLOPRIM ) 100 MG tablet Take 1 tablet by mouth once daily 90 tablet 0   ALPRAZolam  (XANAX ) 0.25 MG tablet Take by mouth 2 (two) times daily as needed.     atorvastatin  (LIPITOR) 80 MG tablet Take 1 tablet (80 mg total) by mouth at bedtime. 90 tablet 0   Azelastine  HCl 137 MCG/SPRAY SOLN Place 1 spray into the nose daily. 30 mL 2   BREO ELLIPTA  100-25 MCG/ACT AEPB Inhale 1 puff into the lungs daily. 30 each 3   Cholecalciferol (VITAMIN D3) 1.25 MG (50000 UT) CAPS Take 1 capsule by mouth once a week 12 capsule 3   colchicine  0.6 MG tablet Take 2 tablets by mouth for  the first dose then take one tablet by mouth an hour later then take one tablet by mouth daily until pain free 90 tablet 0   ELIQUIS 2.5 MG TABS tablet Take 1 tablet by mouth twice daily 60 tablet 0   empagliflozin  (JARDIANCE ) 10 MG TABS tablet TAKE 1 TABLET BY MOUTH ONCE DAILY BEFORE BREAKFAST 90 tablet 1   ezetimibe (ZETIA) 10 MG tablet Take 1 tablet by mouth once daily 90 tablet  0   fluticasone  (FLONASE ) 50 MCG/ACT nasal spray Place 1 spray into both nostrils daily as needed for rhinitis. 48 mL 0   folic acid  (FOLVITE ) 1 MG tablet Take 1 mg by mouth daily.     furosemide  (LASIX ) 20 MG tablet Take 1 tablet (20 mg total) by mouth daily. 30 tablet 11   gabapentin  (NEURONTIN ) 400 MG capsule Take 1 capsule (400 mg total) by mouth 3 (three) times daily. 270 capsule 1   HUMIRA PEN 40 MG/0.4ML PNKT Inject 40 mg into the skin every 14 (fourteen) days.     KERENDIA  10 MG TABS Take 1 tablet by mouth once daily 90 tablet 0   methotrexate  (RHEUMATREX) 2.5 MG tablet Take 10 mg by mouth once a week.     metoprolol succinate (TOPROL-XL) 25 MG 24 hr tablet Take 1 tablet by mouth once daily 90 tablet 0   mupirocin  ointment (BACTROBAN ) 2 % APPLY TO RIGHT GREAT TOE ONCE DAILY UNTIL HEALED 66 g 0   pantoprazole  (PROTONIX ) 20 MG tablet Take 1 tablet by mouth once daily 90 tablet 0   tamsulosin  (FLOMAX ) 0.4 MG CAPS capsule Take 1 capsule (0.4 mg total) by mouth daily. 90 capsule 3   traMADol -acetaminophen  (ULTRACET ) 37.5-325 MG tablet Take 1 tablet by mouth every 6 (six) hours as needed. for pain 30 tablet 0   triamcinolone  cream (KENALOG ) 0.1 % Apply 1 Application topically 2 (two) times daily. to affected area     No current facility-administered medications for this visit.    Allergies:   Patient has no known allergies.    Social History:   reports that he has been smoking cigarettes. He started smoking about 62 years ago. He has a 62 pack-year smoking history. He has never used smokeless tobacco. He reports  current alcohol use of about 1.0 standard drink of alcohol per week. He reports that he does not use drugs.   Family History:  family history includes Alzheimer's disease in his sister; Cancer in his daughter; Cirrhosis in his father; Diabetes in his brother and daughter; Heart attack in his mother; Pancreatic cancer in his brother.    ROS:     Review of Systems  Constitutional: Negative.   HENT: Negative.    Eyes: Negative.   Respiratory: Negative.    Gastrointestinal: Negative.   Genitourinary: Negative.   Musculoskeletal: Negative.   Skin: Negative.   Neurological: Negative.   Endo/Heme/Allergies: Negative.   Psychiatric/Behavioral: Negative.    All other systems reviewed and are negative.     All other systems are reviewed and negative.    PHYSICAL EXAM: VS:  BP 127/81   Pulse (!) 54   Ht 5' 11 (1.803 m)   Wt 222 lb 12.8 oz (101.1 kg)   SpO2 90%   BMI 31.07 kg/m  , BMI Body mass index is 31.07 kg/m. Last weight:  Wt Readings from Last 3 Encounters:  05/29/24 222 lb 12.8 oz (101.1 kg)  05/07/24 217 lb (98.4 kg)  04/26/24 219 lb (99.3 kg)     Physical Exam Vitals reviewed.  Constitutional:      Appearance: Normal appearance. He is normal weight.  HENT:     Head: Normocephalic.     Nose: Nose normal.     Mouth/Throat:     Mouth: Mucous membranes are moist.  Eyes:     Pupils: Pupils are equal, round, and reactive to light.  Cardiovascular:     Rate and Rhythm: Normal rate and regular rhythm.  Pulses: Normal pulses.     Heart sounds: Normal heart sounds.  Pulmonary:     Effort: Pulmonary effort is normal.  Abdominal:     General: Abdomen is flat. Bowel sounds are normal.  Musculoskeletal:        General: Normal range of motion.     Cervical back: Normal range of motion.  Skin:    General: Skin is warm.  Neurological:     General: No focal deficit present.     Mental Status: He is alert.  Psychiatric:        Mood and Affect: Mood normal.        EKG:   Recent Labs: 11/23/2023: Hemoglobin 16.0; Platelets 243 01/19/2024: ALT 14; BUN 20; Creatinine, Ser 2.21; Potassium 4.7; Sodium 139    Lipid Panel    Component Value Date/Time   CHOL 121 05/07/2024 1219   TRIG 68 05/07/2024 1219   HDL 48 05/07/2024 1219   CHOLHDL 2.5 05/07/2024 1219   CHOLHDL 3.4 08/22/2016 0447   VLDL 13 08/22/2016 0447   LDLCALC 59 05/07/2024 1219      Other studies Reviewed: Additional studies/ records that were reviewed today include:  Review of the above records demonstrates:       No data to display            ASSESSMENT AND PLAN:    ICD-10-CM   1. Paroxysmal atrial fibrillation (HCC)  I48.0 furosemide  (LASIX ) 20 MG tablet    2. Tobacco use disorder  F17.200 furosemide  (LASIX ) 20 MG tablet    3. Mixed hyperlipidemia  E78.2 furosemide  (LASIX ) 20 MG tablet    4. Shortness of breath  R06.02 furosemide  (LASIX ) 20 MG tablet   STRESS TEST NORMAL    5. CKD stage 3b, GFR 30-44 ml/min (HCC)  N18.32 furosemide  (LASIX ) 20 MG tablet    6. Coronary artery disease of native artery of native heart with stable angina pectoris  I25.118 furosemide  (LASIX ) 20 MG tablet    7. Acute on chronic combined systolic and diastolic congestive heart failure (HCC)  I50.43 furosemide  (LASIX ) 20 MG tablet   ECHO has LVEF 46%, grade 3 diastolic dysfunction, creat 2.1, on jardiance , cannor get aRBS. Add laSIX        Problem List Items Addressed This Visit       Cardiovascular and Mediastinum   Atrial fibrillation (HCC) - Primary   Relevant Medications   furosemide  (LASIX ) 20 MG tablet   Congestive heart failure (CHF) (HCC)   Relevant Medications   furosemide  (LASIX ) 20 MG tablet     Other   Tobacco use disorder (Chronic)   Relevant Medications   furosemide  (LASIX ) 20 MG tablet   HLD (hyperlipidemia)   Relevant Medications   furosemide  (LASIX ) 20 MG tablet   Shortness of breath   Relevant Medications   furosemide  (LASIX ) 20 MG tablet   Other  Visit Diagnoses       CKD stage 3b, GFR 30-44 ml/min (HCC)       Relevant Medications   furosemide  (LASIX ) 20 MG tablet     Coronary artery disease of native artery of native heart with stable angina pectoris       Relevant Medications   furosemide  (LASIX ) 20 MG tablet          Disposition:   Return in about 4 weeks (around 06/26/2024).    Total time spent: 35 minutes  Signed,  Denyse Bathe, MD  05/29/2024 9:49 AM    [facility] "

## 2024-06-02 ENCOUNTER — Other Ambulatory Visit: Payer: Self-pay | Admitting: Cardiovascular Disease

## 2024-06-06 ENCOUNTER — Ambulatory Visit: Admitting: Internal Medicine

## 2024-06-06 ENCOUNTER — Ambulatory Visit: Payer: Self-pay | Admitting: Internal Medicine

## 2024-06-06 ENCOUNTER — Encounter: Payer: Self-pay | Admitting: Internal Medicine

## 2024-06-06 VITALS — BP 123/84 | HR 55 | Temp 98.0°F | Ht 71.0 in | Wt 223.0 lb

## 2024-06-06 DIAGNOSIS — E1140 Type 2 diabetes mellitus with diabetic neuropathy, unspecified: Secondary | ICD-10-CM

## 2024-06-06 DIAGNOSIS — G3184 Mild cognitive impairment, so stated: Secondary | ICD-10-CM | POA: Diagnosis not present

## 2024-06-06 LAB — POCT CBG (FASTING - GLUCOSE)-MANUAL ENTRY: Glucose Fasting, POC: 107 mg/dL — AB (ref 70–99)

## 2024-06-06 MED ORDER — CETIRIZINE HCL 10 MG PO TABS: 10.0000 mg | ORAL_TABLET | Freq: Every day | ORAL | 2 refills | Status: AC

## 2024-06-06 NOTE — Progress Notes (Signed)
 "  Established Patient Office Visit  Subjective:  Patient ID: Omar [name], male    DOB: 1941/02/24  Age: 83 y.o. MRN: [id number]  Chief Complaint  Patient presents with   Follow-up    2 week follow up. Memory eval.     Here for memory eval, refer to scanned documents. Labs reviewed and notable for well controlled diabetes, A1c at target, lipids at target with unremarkable cmp. Denies any hypoglycemic episodes and home bg readings have been at target.Also c/o excessive nasal drainage.     No other concerns at this time.   Past Medical History:  Diagnosis Date   AAA (abdominal aortic aneurysm)    a.) Abbrielle Batts/p EVAR 11/05/2013.   Anxiety    a.) on BZO (alprazolam ) PRN   Aortic atherosclerosis    Atrial fibrillation (HCC)    a.) CHA2DS2-VASc = 6 (age x2, CHF, HTN, PVD/aortic plaque, T2DM). b.) rate/rhythm maintained without use of pharmacological intervention; chronically anticoagulated using dose reduced apixaban.   BPH (benign prostatic hyperplasia)    CHF (congestive heart failure) (HCC)    a.)  TTE 03/16/2021: EF 63%; moderate LVH; mild BAE mild to moderate pan valvular regurgitation; G1DD.   CKD (chronic kidney disease), stage III (HCC)    Colon polyp 07/07/2015   TUBULAR ADENOMA WITH AT LEAST HIGH-GRADE / Dr Jeri   COPD (chronic obstructive pulmonary disease) (HCC)    Coronary artery disease    a.) coronary CTA 01/10/2015 CA score 722.3; mod-sev LAD disease; mild LCx disease; 50-60% RCA disease.   Current use of long term anticoagulation    a.) dose reduced apixaban   Diverticulosis    Erectile dysfunction    GERD (gastroesophageal reflux disease)    Gout    Grade I diastolic dysfunction    Heart murmur    Hepatic cyst    Hepatic steatosis    Hepatitis    Hypercholesterolemia    Hypertension    Iron  deficiency anemia    Migraines    Moderate tricuspid regurgitation by prior echocardiogram    Multiple myeloma (HCC)    Nephrolithiasis    OSA on CPAP    Presence of  permanent cardiac pacemaker    Prostate cancer (HCC) 01/01/13, 01/30/14   Gleason 3+4=7, volume 46.6 cc   PVD (peripheral vascular disease)    Renal cyst    Rheumatoid arthritis (HCC)    Verneda Hollopeter/P radiation therapy  04/03/2014 through 06/04/2014                                                      Prostate 7800 cGy in 40 sessions                           SSS (sick sinus syndrome) (HCC)    a.) Bryonna Sundby/p Medtronic Azure PPM placement 08/26/2016   T2DM (type 2 diabetes mellitus) (HCC)    Third degree heart block (HCC) 08/26/2016   a.) Omega Durante/p PPM placement (Medtronic Azure XT DR MRI [id number] Shenna Brissette/N: [id number]) on 08/26/2016   Vitamin D deficiency     Past Surgical History:  Procedure Laterality Date   ABDOMINAL AORTIC ANEURYSM REPAIR  11/05/2013   CATARACT EXTRACTION W/PHACO Left 06/22/2023   Procedure: CATARACT EXTRACTION PHACO AND INTRAOCULAR LENS PLACEMENT (IOC) LEFT  CLAREON VIVITY TORIC  17.82  01:15.5;  Surgeon: Mittie Gaskin, MD;  Location: [facility];  Service: Ophthalmology;  Laterality: Left;   CATARACT EXTRACTION W/PHACO Right 07/06/2023   Procedure: CATARACT EXTRACTION PHACO AND INTRAOCULAR LENS PLACEMENT (IOC) RIGHT DIABETIC  CLAREON VIVITY TORIC;  Surgeon: Mittie Gaskin, MD;  Location: [facility];  Service: Ophthalmology;  Laterality: Right;  17.01 1:01.9   COLON SURGERY  08/06/2015   Right hemicolectomy for tubulovillous adenoma with high-grade dysplasia.   COLONOSCOPY WITH PROPOFOL  N/A 07/07/2015   Procedure: COLONOSCOPY WITH PROPOFOL ;  Surgeon: Donnice Vaughn Manes, MD;  Location: [facility];  Service: Endoscopy;  Laterality: N/A;   COLONOSCOPY WITH PROPOFOL  N/A 10/28/2016   Procedure: COLONOSCOPY WITH PROPOFOL ;  Surgeon: Therisa Bi, MD;  Location: [facility] ENDOSCOPY;  Service: Endoscopy;  Laterality: N/A;   CYSTOSCOPY W/ RETROGRADES Bilateral 11/16/2021   Procedure: CYSTOSCOPY WITH RETROGRADE PYELOGRAM;  Surgeon: Penne Knee, MD;  Location: [facility] OR[facility];   Service: Urology;  Laterality: Bilateral;   CYSTOSCOPY WITH BIOPSY N/A 11/16/2021   Procedure: CYSTOSCOPY WITH BLADDER BIOPSY;  Surgeon: Penne Knee, MD;  Location: [facility];  Service: Urology;  Laterality: N/A;   ESOPHAGOGASTRODUODENOSCOPY (EGD) WITH PROPOFOL  N/A 10/28/2016   Procedure: ESOPHAGOGASTRODUODENOSCOPY (EGD) WITH PROPOFOL ;  Surgeon: Therisa Bi, MD;  Location: [facility] ENDOSCOPY;  Service: Endoscopy;  Laterality: N/A;   GIVENS CAPSULE STUDY N/A 07/25/2017   Procedure: GIVENS CAPSULE STUDY;  Surgeon: Therisa Bi, MD;  Location: [facility] END[facility]COPY;  Service: Gastroenterology;  Laterality: N/A;   GIVENS CAPSULE STUDY N/A 01/29/2019   Procedure: GIVENS CAPSULE STUDY;  Surgeon: Therisa Bi, MD;  Location: [facility] ENDOSCOPY;  Service: Gastroenterology;  Laterality: N/A;   IR BONE MARROW BIOPSY & ASPIRATION  11/23/2023   LAPAROSCOPIC RIGHT COLECTOMY Right 08/08/2015   Procedure: LAPAROSCOPIC RIGHT COLECTOMY;  Surgeon: Reyes LELON Cota, MD;  Location: [facility];  Service: General;  Laterality: Right;   NASAL SINUS SURGERY     PACEMAKER INSERTION Left 08/26/2016   Procedure: INSERTION PACEMAKER;  Surgeon: Marsa Dooms, MD;  Location: [facility];  Service: Cardiovascular;  Laterality: Left;   PROSTATE BIOPSY  01/01/13, 01/30/14   Gleason 3+3=6, vol 46.6 cc   TONSILLECTOMY     uvula surgery     for sleep apnea    Social History   Socioeconomic History   Marital status: Divorced    Spouse name: Not on file   Number of children: Not on file   Years of education: Not on file   Highest education level: Not on file  Occupational History   Not on file  Tobacco Use   Smoking status: Every Day    Current packs/day: 1.00    Average packs/day: 1 pack/day for 62.0 years (62.0 ttl pk-yrs)    Types: Cigarettes    Start date: 1964   Smokeless tobacco: Never   Tobacco comments:    DOWN TO 1/2 PPD  Vaping Use   Vaping status: Never Used  Substance and Sexual Activity   Alcohol use: Yes     Alcohol/week: 1.0 standard drink of alcohol    Types: 1 Cans of beer per week    Comment: occasionally   Drug use: No   Sexual activity: Not Currently  Other Topics Concern   Not on file  Social History Narrative   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   Social Drivers of Health   Tobacco Use: High Risk (06/06/2024)   Patient History    Smoking Tobacco Use: Every Day    Smokeless Tobacco Use: Never  Passive Exposure: Not on file  Financial Resource Strain: Not on file  Food Insecurity: No Food Insecurity (09/14/2022)   Hunger Vital Sign    Worried About Running Out of Food in the Last Year: Never true    Ran Out of Food in the Last Year: Never true  Transportation Needs: No Transportation Needs (09/14/2022)   PRAPARE - Administrator, Civil Service (Medical): No    Lack of Transportation (Non-Medical): No  Physical Activity: Not on file  Stress: Not on file  Social Connections: Not on file  Intimate Partner Violence: Not on file  Depression (PHQ2-9): Low Risk (05/07/2024)   Depression (PHQ2-9)    PHQ-2 Score: 0  Alcohol Screen: Not on file  Housing: Unknown (03/19/2024)   Received from [facility]   Epic    Unable to Pay for Housing in the Last Year: Not on file    Number of Times Moved in the Last Year: Not on file    At any time in the past 12 months, were you homeless or living in a shelter (including now)?: No  Utilities: Not on file  Health Literacy: Not on file    Family History  Problem Relation Age of Onset   Heart attack Mother    Cirrhosis Father    Alzheimer'Vearl Allbaugh disease Sister    Pancreatic cancer Brother    Diabetes Brother    Diabetes Daughter        medication induced for cancer treatments   Cancer Daughter        breast, brain    Allergies[1]  Show/hide medication list[2]  Review of Systems  Constitutional:  Negative for weight loss (3 lbs).  HENT:         Nasal discharge  Eyes: Negative.   Cardiovascular: Negative.    Gastrointestinal: Negative.   Genitourinary: Negative.   Musculoskeletal:  Positive for back pain.  Skin: Negative.   Neurological: Negative.   Endo/Heme/Allergies:  Positive for environmental allergies.  Psychiatric/Behavioral:  Positive for memory loss.        Objective:   BP 123/84   Pulse (!) 55   Temp 98 F (36.7 C)   Ht 5' 11 (1.803 m)   Wt 223 lb (101.2 kg)   SpO2 94%   BMI 31.10 kg/m   Vitals:   06/06/24 1014  BP: 123/84  Pulse: (!) 55  Temp: 98 F (36.7 C)  Height: 5' 11 (1.803 m)  Weight: 223 lb (101.2 kg)  SpO2: 94%  BMI (Calculated): 31.12    Physical Exam Vitals reviewed.  Constitutional:      Appearance: Normal appearance.  HENT:     Head: Normocephalic.     Left Ear: There is no impacted cerumen.     Nose: Nose normal.     Mouth/Throat:     Mouth: Mucous membranes are moist.     Pharynx: No posterior oropharyngeal erythema.  Eyes:     Extraocular Movements: Extraocular movements intact.     Pupils: Pupils are equal, round, and reactive to light.  Cardiovascular:     Rate and Rhythm: Regular rhythm.     Chest Wall: PMI is not displaced.     Pulses: Normal pulses.     Heart sounds: Normal heart sounds. No murmur heard. Pulmonary:     Effort: Pulmonary effort is normal.     Breath sounds: Normal air entry. Rales (scanty basal) present. No rhonchi.  Abdominal:     General: Abdomen is  flat. Bowel sounds are normal. There is no distension.     Palpations: Abdomen is soft. There is no hepatomegaly, splenomegaly or mass.     Tenderness: There is no abdominal tenderness.  Musculoskeletal:        General: Normal range of motion.     Cervical back: Normal range of motion and neck supple.     Right lower leg: No edema.     Left lower leg: No edema.  Skin:    General: Skin is warm and dry.  Neurological:     General: No focal deficit present.     Mental Status: He is alert and oriented to person, place, and time.     Cranial Nerves: No  cranial nerve deficit.     Motor: No weakness.  Psychiatric:        Mood and Affect: Mood normal.        Behavior: Behavior normal.      Results for orders placed or performed in visit on 06/06/24  POCT CBG (Fasting - Glucose)  Result Value Ref Range   Glucose Fasting, POC 107 (A) 70 - 99 mg/dL    Recent Results (from the past 2160 hours)  POCT CBG (Fasting - Glucose)     Status: None   Collection Time: 05/07/24 11:35 AM  Result Value Ref Range   Glucose Fasting, POC 86 70 - 99 mg/dL  Lipid panel     Status: None   Collection Time: 05/07/24 12:19 PM  Result Value Ref Range   Cholesterol, Total 121 100 - 199 mg/dL   Triglycerides 68 0 - 149 mg/dL   HDL 48 >60 mg/dL   VLDL Cholesterol Cal 14 5 - 40 mg/dL   LDL Chol Calc (NIH) 59 0 - 99 mg/dL   Chol/HDL Ratio 2.5 0.0 - 5.0 ratio    Comment:                                   T. Chol/HDL Ratio                                             Men  Women                               1/2 Avg.Risk  3.4    3.3                                   Avg.Risk  5.0    4.4                                2X Avg.Risk  9.6    7.1                                3X Avg.Risk 23.4   11.0   Hemoglobin A1c     Status: Abnormal   Collection Time: 05/07/24 12:19 PM  Result Value Ref Range   Hgb A1c MFr Bld 6.4 (H) 4.8 - 5.6 %    Comment:  Prediabetes: 5.7 - 6.4          Diabetes: >6.4          Glycemic control for adults with diabetes: <7.0    Est. average glucose Bld gHb Est-mCnc 137 mg/dL  POCT CBG (Fasting - Glucose)     Status: Abnormal   Collection Time: 06/06/24 10:21 AM  Result Value Ref Range   Glucose Fasting, POC 107 (A) 70 - 99 mg/dL      Assessment & Plan:  Avyukt was seen today for follow-up.  Mild cognitive impairment  Type 2 diabetes mellitus with diabetic neuropathy, unspecified whether long term insulin  use (HCC) -     POCT CBG (Fasting - Glucose)  Other orders -     Cetirizine  HCl; Take 1 tablet (10 mg total) by  mouth daily.  Dispense: 30 tablet; Refill: 2  Advised to perform memory training exercises.  Problem List Items Addressed This Visit       Endocrine   Diabetes mellitus (HCC)   Relevant Orders   POCT CBG (Fasting - Glucose) (Completed)   Other Visit Diagnoses       Mild cognitive impairment    -  Primary       Return in about 9 weeks (around 08/08/2024) for fu with labs prior.   Total time spent: 30 minutes. This time includes review of previous notes and results and patient face to face interaction during today'Marcianne Ozbun visit.    Sherrill Cinderella Perry, MD  06/06/2024   This document may have been prepared by [facility] Voice Recognition software and as such may include unintentional dictation errors.      [1] No Known Allergies [2]  Outpatient Medications Prior to Visit  Medication Sig   albuterol  (VENTOLIN  HFA) 108 (90 Base) MCG/ACT inhaler INHALE 1 PUFF BY MOUTH EVERY 6 HOURS AS NEEDED   allopurinol  (ZYLOPRIM ) 100 MG tablet Take 1 tablet by mouth once daily   ALPRAZolam  (XANAX ) 0.25 MG tablet Take by mouth 2 (two) times daily as needed.   atorvastatin  (LIPITOR) 80 MG tablet Take 1 tablet (80 mg total) by mouth at bedtime.   Azelastine  HCl 137 MCG/SPRAY SOLN Place 1 spray into the nose daily.   BREO ELLIPTA  100-25 MCG/ACT AEPB Inhale 1 puff into the lungs daily.   Cholecalciferol (VITAMIN D3) 1.25 MG (50000 UT) CAPS Take 1 capsule by mouth once a week   colchicine  0.6 MG tablet Take 2 tablets by mouth for the first dose then take one tablet by mouth an hour later then take one tablet by mouth daily until pain free   ELIQUIS 2.5 MG TABS tablet Take 1 tablet by mouth twice daily   empagliflozin  (JARDIANCE ) 10 MG TABS tablet TAKE 1 TABLET BY MOUTH ONCE DAILY BEFORE BREAKFAST   ezetimibe (ZETIA) 10 MG tablet Take 1 tablet by mouth once daily   fluticasone  (FLONASE ) 50 MCG/ACT nasal spray Place 1 spray into both nostrils daily as needed for rhinitis.   folic acid  (FOLVITE ) 1 MG  tablet Take 1 mg by mouth daily.   furosemide  (LASIX ) 20 MG tablet Take 1 tablet (20 mg total) by mouth daily.   gabapentin  (NEURONTIN ) 400 MG capsule Take 1 capsule (400 mg total) by mouth 3 (three) times daily.   HUMIRA PEN 40 MG/0.4ML PNKT Inject 40 mg into the skin every 14 (fourteen) days.   KERENDIA  10 MG TABS Take 1 tablet by mouth once daily   methotrexate  (RHEUMATREX) 2.5 MG tablet Take 10 mg by mouth  once a week.   metoprolol succinate (TOPROL-XL) 25 MG 24 hr tablet Take 1 tablet by mouth once daily   mupirocin  ointment (BACTROBAN ) 2 % APPLY TO RIGHT GREAT TOE ONCE DAILY UNTIL HEALED   pantoprazole  (PROTONIX ) 20 MG tablet Take 1 tablet by mouth once daily   tamsulosin  (FLOMAX ) 0.4 MG CAPS capsule Take 1 capsule (0.4 mg total) by mouth daily.   traMADol -acetaminophen  (ULTRACET ) 37.5-325 MG tablet Take 1 tablet by mouth every 6 (six) hours as needed. for pain   triamcinolone  cream (KENALOG ) 0.1 % Apply 1 Application topically 2 (two) times daily. to affected area   No facility-administered medications prior to visit.   "

## 2024-06-07 ENCOUNTER — Other Ambulatory Visit: Payer: Self-pay | Admitting: Internal Medicine

## 2024-06-12 ENCOUNTER — Inpatient Hospital Stay: Attending: Oncology

## 2024-06-12 DIAGNOSIS — R911 Solitary pulmonary nodule: Secondary | ICD-10-CM | POA: Diagnosis not present

## 2024-06-12 DIAGNOSIS — R5383 Other fatigue: Secondary | ICD-10-CM | POA: Insufficient documentation

## 2024-06-12 DIAGNOSIS — Z79631 Long term (current) use of antimetabolite agent: Secondary | ICD-10-CM | POA: Insufficient documentation

## 2024-06-12 DIAGNOSIS — G4733 Obstructive sleep apnea (adult) (pediatric): Secondary | ICD-10-CM | POA: Diagnosis not present

## 2024-06-12 DIAGNOSIS — D509 Iron deficiency anemia, unspecified: Secondary | ICD-10-CM | POA: Insufficient documentation

## 2024-06-12 DIAGNOSIS — F17210 Nicotine dependence, cigarettes, uncomplicated: Secondary | ICD-10-CM | POA: Diagnosis not present

## 2024-06-12 DIAGNOSIS — M069 Rheumatoid arthritis, unspecified: Secondary | ICD-10-CM | POA: Diagnosis not present

## 2024-06-12 DIAGNOSIS — Z8546 Personal history of malignant neoplasm of prostate: Secondary | ICD-10-CM | POA: Insufficient documentation

## 2024-06-12 DIAGNOSIS — Z800 Family history of malignant neoplasm of digestive organs: Secondary | ICD-10-CM | POA: Diagnosis not present

## 2024-06-12 DIAGNOSIS — D472 Monoclonal gammopathy: Secondary | ICD-10-CM | POA: Diagnosis present

## 2024-06-12 DIAGNOSIS — Z79899 Other long term (current) drug therapy: Secondary | ICD-10-CM | POA: Diagnosis not present

## 2024-06-12 DIAGNOSIS — N183 Chronic kidney disease, stage 3 unspecified: Secondary | ICD-10-CM | POA: Insufficient documentation

## 2024-06-12 LAB — CBC WITH DIFFERENTIAL (CANCER CENTER ONLY)
Abs Immature Granulocytes: 0.08 K/uL — ABNORMAL HIGH (ref 0.00–0.07)
Basophils Absolute: 0 K/uL (ref 0.0–0.1)
Basophils Relative: 1 %
Eosinophils Absolute: 0.3 K/uL (ref 0.0–0.5)
Eosinophils Relative: 4 %
HCT: 48.2 % (ref 39.0–52.0)
Hemoglobin: 15 g/dL (ref 13.0–17.0)
Immature Granulocytes: 1 %
Lymphocytes Relative: 14 %
Lymphs Abs: 1.1 K/uL (ref 0.7–4.0)
MCH: 27.5 pg (ref 26.0–34.0)
MCHC: 31.1 g/dL (ref 30.0–36.0)
MCV: 88.4 fL (ref 80.0–100.0)
Monocytes Absolute: 0.8 K/uL (ref 0.1–1.0)
Monocytes Relative: 10 %
Neutro Abs: 5.8 K/uL (ref 1.7–7.7)
Neutrophils Relative %: 70 %
Platelet Count: 226 K/uL (ref 150–400)
RBC: 5.45 MIL/uL (ref 4.22–5.81)
RDW: 23.9 % — ABNORMAL HIGH (ref 11.5–15.5)
WBC Count: 8.2 K/uL (ref 4.0–10.5)
nRBC: 0 % (ref 0.0–0.2)

## 2024-06-12 LAB — CMP (CANCER CENTER ONLY)
ALT: 22 U/L (ref 0–44)
AST: 21 U/L (ref 15–41)
Albumin: 4.1 g/dL (ref 3.5–5.0)
Alkaline Phosphatase: 103 U/L (ref 38–126)
Anion gap: 11 (ref 5–15)
BUN: 19 mg/dL (ref 8–23)
CO2: 23 mmol/L (ref 22–32)
Calcium: 9.8 mg/dL (ref 8.9–10.3)
Chloride: 105 mmol/L (ref 98–111)
Creatinine: 1.93 mg/dL — ABNORMAL HIGH (ref 0.61–1.24)
GFR, Estimated: 34 mL/min — ABNORMAL LOW
Glucose, Bld: 137 mg/dL — ABNORMAL HIGH (ref 70–99)
Potassium: 4.4 mmol/L (ref 3.5–5.1)
Sodium: 139 mmol/L (ref 135–145)
Total Bilirubin: 0.3 mg/dL (ref 0.0–1.2)
Total Protein: 8 g/dL (ref 6.5–8.1)

## 2024-06-13 ENCOUNTER — Other Ambulatory Visit: Payer: Self-pay | Admitting: Internal Medicine

## 2024-06-13 LAB — BETA 2 MICROGLOBULIN, SERUM: Beta-2 Microglobulin: 4 mg/L — ABNORMAL HIGH (ref 0.6–2.4)

## 2024-06-13 LAB — KAPPA/LAMBDA LIGHT CHAINS
Kappa free light chain: 223.7 mg/L — ABNORMAL HIGH (ref 3.3–19.4)
Kappa, lambda light chain ratio: 5.61 — ABNORMAL HIGH (ref 0.26–1.65)
Lambda free light chains: 39.9 mg/L — ABNORMAL HIGH (ref 5.7–26.3)

## 2024-06-14 LAB — MULTIPLE MYELOMA PANEL, SERUM
Albumin SerPl Elph-Mcnc: 3.4 g/dL (ref 2.9–4.4)
Albumin/Glob SerPl: 0.9 (ref 0.7–1.7)
Alpha 1: 0.2 g/dL (ref 0.0–0.4)
Alpha2 Glob SerPl Elph-Mcnc: 0.7 g/dL (ref 0.4–1.0)
B-Globulin SerPl Elph-Mcnc: 1.1 g/dL (ref 0.7–1.3)
Gamma Glob SerPl Elph-Mcnc: 2 g/dL — ABNORMAL HIGH (ref 0.4–1.8)
Globulin, Total: 4 g/dL — ABNORMAL HIGH (ref 2.2–3.9)
IgA: 245 mg/dL (ref 61–437)
IgG (Immunoglobin G), Serum: 1932 mg/dL — ABNORMAL HIGH (ref 603–1613)
IgM (Immunoglobulin M), Srm: 121 mg/dL (ref 15–143)
Total Protein ELP: 7.4 g/dL (ref 6.0–8.5)

## 2024-06-15 ENCOUNTER — Other Ambulatory Visit: Payer: Self-pay

## 2024-06-15 DIAGNOSIS — D472 Monoclonal gammopathy: Secondary | ICD-10-CM

## 2024-06-20 LAB — IFE+PROTEIN ELECTRO, 24-HR UR
% BETA, Urine: 55.7 %
ALPHA 1 URINE: 2.1 %
Albumin, U: 19.7 %
Alpha 2, Urine: 7 %
GAMMA GLOBULIN URINE: 15.5 %
M-SPIKE %, Urine: 34.5 % — ABNORMAL HIGH
M-Spike, Mg/24 Hr: 26 mg/(24.h) — ABNORMAL HIGH
Total Protein, Urine-Ur/day: 75 mg/(24.h) (ref 30–150)
Total Protein, Urine: 6 mg/dL
Total Volume: 1250

## 2024-06-21 ENCOUNTER — Encounter: Payer: Self-pay | Admitting: Oncology

## 2024-06-21 ENCOUNTER — Inpatient Hospital Stay: Admitting: Oncology

## 2024-06-21 VITALS — BP 119/80 | HR 93 | Temp 96.0°F | Resp 18 | Wt 221.6 lb

## 2024-06-21 DIAGNOSIS — C61 Malignant neoplasm of prostate: Secondary | ICD-10-CM

## 2024-06-21 DIAGNOSIS — R911 Solitary pulmonary nodule: Secondary | ICD-10-CM

## 2024-06-21 DIAGNOSIS — D472 Monoclonal gammopathy: Secondary | ICD-10-CM

## 2024-06-21 DIAGNOSIS — F17200 Nicotine dependence, unspecified, uncomplicated: Secondary | ICD-10-CM

## 2024-06-21 NOTE — Assessment & Plan Note (Signed)
Encourage his smoke cessation effort.  Erythrocytosis has resolved.

## 2024-06-21 NOTE — Progress Notes (Signed)
 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  He is not currently on any maintenance therapy due to multiple comorbidities, borderline smoldering multiple myeloma status.    Lab Results  Component Value Date   MPROTEIN Not Observed 06/12/2024   KPAFRELGTCHN 223.7 (H) 06/12/2024   LAMBDASER 39.9 (H) 06/12/2024   KAPLAMBRATIO 5.61 (H) 06/12/2024    CKD, creatinine relatively stable.  No anemia.  No hypercalcemia. slow progressing light chain ratio. 24 hour UPEP M protein 26mg , negative skeletal survey 11/23/2023 bone marrow biopsy showed 30% cellularity with no increase of plasma cells. Kappa/lambda ISH is polyclonal.  Cytogenetics normal. Continue observation.  Follow-up in 4 months  Lung nodule Repeat CT chest in Sept 2024 showed stable right upper lobe and right lower lobe lung nodules Repeat CT in Sept 2025  Malignant neoplasm of prostate (HCC) status post IMRT in 2015.  PSA is stable.  He follows up with urology annually   Tobacco use disorder Encourage his smoke cessation effort.  Erythrocytosis has resolved.   Orders Placed This Encounter  Procedures   DG Bone Survey Met    Standing Status:   Future    Expected Date:   06/21/2024    Expiration Date:   06/21/2025    Reason for Exam (SYMPTOM  OR DIAGNOSIS REQUIRED):   smodering Myeloma    Preferred imaging location?:   [facility]   CBC with Differential (Cancer Center Only)    Standing Status:   Future    Expected Date:   10/19/2024    Expiration Date:   01/17/2025   CMP (Cancer Center only)    Standing Status:   Future    Expected Date:   10/19/2024    Expiration Date:   01/17/2025   Multiple  Myeloma Panel (SPEP&IFE w/QIG)    Standing Status:   Future    Expected Date:   10/19/2024    Expiration Date:   01/17/2025   Kappa/lambda light chains    Standing Status:   Future    Expected Date:   10/19/2024    Expiration Date:   01/17/2025   Beta 2 microglobulin, serum    Standing Status:   Future    Expected Date:   10/19/2024    Expiration Date:   01/17/2025   IFE+PROTEIN ELECTRO, 24-HR UR    Standing Status:   Future    Expected Date:   10/19/2024    Expiration Date:   01/17/2025   Follow up 6 months.  All questions were answered. The patient knows to call the clinic with any problems, questions or concerns.  Zelphia Cap, MD, PhD [facility] Hematology Oncology 06/21/2024     HISTORY OF PRESENTING ILLNESS:  84 y.o.  male with PMH listed below who presents to follow up on the evaluation and management of his abnormal urine protein electrophoresis results. I reviewed the records from [facility], [facility] and [facility] was performed and HemOnc related medical problems are listed below.  He lives with a friend. He has 3 adult children.   1 Chronic Kidney disease, Stage III: patient follows up with Dr.Lateef.  2 Proteinuria:  02/18/2017: Albumin /Creatinine ratio was 7, Urine protein electrophoresis,Random Urine revealed M  Spike 43.2%, total protein of 43.7mg /dl,  06/15/7979 Albumin /Creatinine ratio was 455.2, Albumin  996.5, , 2.15.2018 Albumin /Creatinine ratio was 80.3, Albumin  73.3, Urine protein electrophoresis,Random Urine revealed M Spike 29.7%, total protein of 29.1mg /dl, 8/75/7981 Serum protein electrophoresis did not detect M spike.  Autoimmune disorder work up showed Negative anti dsDNA, RNP antibodies, smith antibody, Sjogren antibodies, anti Jo-1, positive antichromotin antibodies, positive ANA,  3 Anemia of chronic kidney disease:  4 Iron  deficiency anemia: history of iron  deficiency, ferritin was 7 on 08/11/2016. He had EGD and  colonoscopy that were done this year which showed duodenitis/esphagitis/diverticulosis/benigh polyps. # small bowel AVMs was ablated at [facility].    Rheumatoid arthritis: he follows up with Rheumatology and is on chronic MTX and Humaria  reports joint pains are controlled, not quite symptomatic.   Patient denies any persistent bone pain or any pain. He continue to feel lack of energy, persistent, not improved with resting or taking naps. He also has lost 25 pounds in the past year which was unintentional.    # It is ambiguous whether he has truly symptomatic multiple myeloma or a smoldering myeloma, given that anemia can be secondary to iron  deficiency and CKD.  The hypermetabolic 2.1 cm hypermetabolic soft tissue lesion in the left heel, can reflect a Plasmacytoma,which should have been biopsied. However patient initially denies being on any blood thinner, later he was found to be on Plavix with pre biopsy questionnaire screening, and biopsy was held. Patient has to contact cardiologist to hold plavix for seven days before procedure. He feels frustrated about multiple workup and meanwhile he becomes more fatigues, with worsening of kidney function. Patient was reluctant about proceeding additional invasive procedures and wants to start treatment. Decision was made to start active MM treatment.   # He was evaluated by [facility] bone marrow transplant team for autologous bone marrow transplant. He is not considered to  good candidate for transplant. Patient cardiologist Dr. Deretha had started patient on Lasix  20 mg daily. Lasix  was discontinued by [facility] bone marrow transplant team as patient was having dizziness and borderline blood pressure during his clinic visit there.  10/02/2018 He had follow-up left foot/ankle x-ray as a follow-up of his left heel lesion which was initially presented on PET scan and not detectable on follow-up PET scan.  Images were reviewed by me.  No suspicious acute or subacute osseous  abnormality  Current Treatment:  S/p RVD x 4 cycles tolerates well except cytopenia.  Revlimid  10 mg PO once per day on days 1 to 14 (dose reduced due to GFR) Revlimid  renal dosing of 10mg ,  Velcade  1.3 mg/m2 IV once per day on days 1,  8, 15 Dexamethasone  20 mg PO once per day on days 1, 8, 15 given patient's diabetes Given patient's multiple comorbidity and the side effects of cytopenia during his RVD treatment and the uncertainty of this is really a site of plasmacytoma or not, I recommend not to continue him on maintenance Revlimid .   # repeat small bowel capsule study on 02/16/2019 Which showed multiple AVMs seen in the mid jejunum proximal to the site of prior tattoo. # Patient was referred to [facility] and he is status post 06/19/2019 double-balloon enteroscopy with findings of 5 AVMs in the jejunum and 2 AVMs in the duodenum and AVMs were ablated with APC.  Tattoo was seen in the area. If further bleeding, recommend to consider treatment with octreotide. Patient takes Protonix  daily.    11/23/2023, pulmonary biopsy showed normocellular bone marrow 30%  with trilineage hematopoiesis and no increase in plasma cells.  Cytogenetics normal.  Patient has sleep apnea, he uses CPAP machine as much as he can, approximately 2-3 times per week.  INTERVAL HISTORY Patient presents for follow-up of management of multiple myeloma/smoldering multiple myeloma and iron  deficiency, secondary erythrocytosis. Patient continues to smoke daily RA, he is on Humaria and methotrexate .   He has no new complaints.    Review of Systems  Constitutional:  Positive for fatigue. Negative for appetite change, chills, fever and unexpected weight change.  HENT:   Negative for hearing loss and voice change.   Eyes:  Negative for eye problems and icterus.  Respiratory:  Negative for chest tightness, cough and shortness of breath.   Cardiovascular:  Negative for chest pain and leg swelling.  Gastrointestinal:  Negative for  abdominal distention, abdominal pain and diarrhea.  Endocrine: Negative for hot flashes.  Genitourinary:  Negative for difficulty urinating, dysuria and frequency.   Musculoskeletal:  Positive for arthralgias.  Skin:  Negative for itching and rash.  Neurological:  Negative for light-headedness and numbness.  Hematological:  Negative for adenopathy. Does not bruise/bleed easily.  Psychiatric/Behavioral:  Negative for confusion.      MEDICAL HISTORY:  Past Medical History:  Diagnosis Date   AAA (abdominal aortic aneurysm)    a.) s/p EVAR 11/05/2013.   Anxiety    a.) on BZO (alprazolam ) PRN   Aortic atherosclerosis    Atrial fibrillation (HCC)    a.) CHA2DS2-VASc = 6 (age x2, CHF, HTN, PVD/aortic plaque, T2DM). b.) rate/rhythm maintained without use of pharmacological intervention; chronically anticoagulated using dose reduced apixaban.   BPH (benign prostatic hyperplasia)    CHF (congestive heart failure) (HCC)    a.)  TTE 03/16/2021: EF 63%; moderate LVH; mild BAE mild to moderate pan valvular regurgitation; G1DD.   CKD (chronic kidney disease), stage III (HCC)    Colon polyp 07/07/2015   TUBULAR ADENOMA WITH AT LEAST HIGH-GRADE / Dr Jeri   COPD (chronic obstructive pulmonary disease) (HCC)    Coronary artery disease    a.) coronary CTA 01/10/2015 CA score 722.3; mod-sev LAD disease; mild LCx disease; 50-60% RCA disease.   Current use of long term anticoagulation    a.) dose reduced apixaban   Diverticulosis    Erectile dysfunction    GERD (gastroesophageal reflux disease)    Gout    Grade I diastolic dysfunction    Heart murmur    Hepatic cyst    Hepatic steatosis    Hepatitis    Hypercholesterolemia    Hypertension    Iron  deficiency anemia    Migraines    Moderate tricuspid regurgitation by prior echocardiogram    Multiple myeloma (HCC)    Nephrolithiasis    OSA on CPAP    Presence of permanent cardiac pacemaker    Prostate cancer (HCC) 01/01/13, 01/30/14   Gleason  3+4=7, volume 46.6 cc   PVD (peripheral vascular disease)    Renal cyst    Rheumatoid arthritis (HCC)    S/P radiation therapy  04/03/2014 through 06/04/2014                                                      Prostate 7800 cGy in 40 sessions  SSS (sick sinus syndrome) (HCC)    a.) s/p Medtronic Azure PPM placement 08/26/2016   T2DM (type 2 diabetes mellitus) (HCC)    Third degree heart block (HCC) 08/26/2016   a.) s/p PPM placement (Medtronic Azure XT DR MRI [id number] S/N: [id number]) on 08/26/2016   Vitamin D deficiency     SURGICAL HISTORY: Past Surgical History:  Procedure Laterality Date   ABDOMINAL AORTIC ANEURYSM REPAIR  11/05/2013   CATARACT EXTRACTION W/PHACO Left 06/22/2023   Procedure: CATARACT EXTRACTION PHACO AND INTRAOCULAR LENS PLACEMENT (IOC) LEFT  CLAREON VIVITY TORIC  17.82  01:15.5;  Surgeon: Mittie Gaskin, MD;  Location: [facility];  Service: Ophthalmology;  Laterality: Left;   CATARACT EXTRACTION W/PHACO Right 07/06/2023   Procedure: CATARACT EXTRACTION PHACO AND INTRAOCULAR LENS PLACEMENT (IOC) RIGHT DIABETIC  CLAREON VIVITY TORIC;  Surgeon: Mittie Gaskin, MD;  Location: [facility];  Service: Ophthalmology;  Laterality: Right;  17.01 1:01.9   COLON SURGERY  08/06/2015   Right hemicolectomy for tubulovillous adenoma with high-grade dysplasia.   COLONOSCOPY WITH PROPOFOL  N/A 07/07/2015   Procedure: COLONOSCOPY WITH PROPOFOL ;  Surgeon: Donnice Vaughn Manes, MD;  Location[facility];  Service: Endoscopy;  Laterality: N/A;   COLONOSCOPY WITH PROPOFOL  N/A 10/28/2016   Procedure: COLONOSCOPY WITH PROPOFOL ;  Surgeon: Therisa Bi, MD;  Location: [facility] ENDOSCOPY;  Service: Endoscopy;  Laterality: N/A;   CYSTOSCOPY W/ RETROGRADES Bilateral 11/16/2021   Procedure: CYSTOSCOPY WITH RETROGRADE PYELOGRAM;  Surgeon: Penne Knee, MD;  Location: [facility] ORS;  Service: Urology;  Laterality: Bilateral;   CYSTOSCOPY WITH BIOPSY  N/A 11/16/2021   Procedure: CYSTOSCOPY WITH BLADDER BIOPSY;  Surgeon: Penne Knee, MD;  Location: [facility];  Service: Urology;  Laterality: N/A;   ESOPHAGOGASTRODUODENOSCOPY (EGD) WITH PROPOFOL  N/A 10/28/2016   Procedure: ESOPHAGOGASTRODUODENOSCOPY (EGD) WITH PROPOFOL ;  Surgeon: Therisa Bi, MD;  Location: [facility] ENDOSCOPY;  Service: Endoscopy;  Laterality: N/A;   GIVENS CAPSULE STUDY N/A 07/25/2017   Procedure: GIVENS CAPSULE STUDY;  Surgeon: Therisa Bi, MD;  Location: [facility] ENDOSCOPY;  Service: Gastroenterology;  Laterality: N/A;   GIVENS CAPSULE STUDY N/A 01/29/2019   Procedure: GIVENS CAPSULE STUDY;  Surgeon: Therisa Bi, MD;  Location: [facility] ENDOSCOPY;  Service: Gastroenterology;  Laterality: N/A;   IR BONE MARROW BIOPSY & ASPIRATION  11/23/2023   LAPAROSCOPIC RIGHT COLECTOMY Right 08/08/2015   Procedure: LAPAROSCOPIC RIGHT COLECTOMY;  Surgeon: Reyes LELON Cota, MD;  Location: [facility];  Service: General;  Laterality: Right;   NASAL SINUS SURGERY     PACEMAKER INSERTION Left 08/26/2016   Procedure: INSERTION PACEMAKER;  Surgeon: Marsa Dooms, MD;  Location: [facility];  Service: Cardiovascular;  Laterality: Left;   PROSTATE BIOPSY  01/01/13, 01/30/14   Gleason 3+3=6, vol 46.6 cc   TONSILLECTOMY     uvula surgery     for sleep apnea    SOCIAL HISTORY: Social History   Socioeconomic History   Marital status: Divorced    Spouse name: Not on file   Number of children: Not on file   Years of education: Not on file   Highest education level: Not on file  Occupational History   Not on file  Tobacco Use   Smoking status: Every Day    Current packs/day: 1.00    Average packs/day: 1 pack/day for 62.0 years (62.0 ttl pk-yrs)    Types: Cigarettes    Start date: 1964   Smokeless tobacco: Never   Tobacco comments:    DOWN TO 1/2 PPD  Vaping Use   Vaping status: Never Used  Substance and Sexual Activity   Alcohol use: Yes    Alcohol/week: 1.0 standard drink of alcohol    Types:  1 Cans of beer per week    Comment: occasionally   Drug use: No   Sexual activity: Not Currently  Other Topics Concern   Not on file  Social History Narrative   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   Social Drivers of Health   Tobacco Use: High Risk (06/21/2024)   Patient History    Smoking Tobacco Use: Every Day    Smokeless Tobacco Use: Never    Passive Exposure: Not on file  Financial Resource Strain: Not on file  Food Insecurity: No Food Insecurity (09/14/2022)   Hunger Vital Sign    Worried About Running Out of Food in the Last Year: Never true    Ran Out of Food in the Last Year: Never true  Transportation Needs: No Transportation Needs (09/14/2022)   PRAPARE - Administrator, Civil Service (Medical): No    Lack of Transportation (Non-Medical): No  Physical Activity: Not on file  Stress: Not on file  Social Connections: Not on file  Intimate Partner Violence: Not on file  Depression (PHQ2-9): Low Risk (05/07/2024)   Depression (PHQ2-9)    PHQ-2 Score: 0  Alcohol Screen: Not on file  Housing: Unknown (03/19/2024)   Received from [facility]   Epic    Unable to Pay for Housing in the Last Year: Not on file    Number of Times Moved in the Last Year: Not on file    At any time in the past 12 months, were you homeless or living in a shelter (including now)?: No  Utilities: Not on file  Health Literacy: Not on file    FAMILY HISTORY: Family History  Problem Relation Age of Onset   Heart attack Mother    Cirrhosis Father    Alzheimer's disease Sister    Pancreatic cancer Brother    Diabetes Brother    Diabetes Daughter        medication induced for cancer treatments   Cancer Daughter        breast, brain    ALLERGIES:  has no known allergies.  MEDICATIONS:  Current Outpatient Medications  Medication Sig Dispense Refill   albuterol  (VENTOLIN  HFA) 108 (90 Base) MCG/ACT inhaler INHALE 1 PUFF BY MOUTH EVERY 6 HOURS AS NEEDED 9 g 2    allopurinol  (ZYLOPRIM ) 100 MG tablet Take 1 tablet by mouth once daily 90 tablet 0   ALPRAZolam  (XANAX ) 0.25 MG tablet Take by mouth 2 (two) times daily as needed.     atorvastatin  (LIPITOR) 80 MG tablet Take 1 tablet (80 mg total) by mouth at bedtime. 90 tablet 0   Azelastine  HCl 137 MCG/SPRAY SOLN Place 1 spray into the nose daily. 30 mL 2   BREO ELLIPTA  100-25 MCG/ACT AEPB Inhale 1 puff into the lungs daily. 30 each 3   cetirizine  (ZYRTEC  ALLERGY) 10 MG tablet Take 1 tablet (10 mg total) by mouth daily. 30 tablet 2   Cholecalciferol (VITAMIN D3) 1.25 MG (50000 UT) CAPS Take 1 capsule by mouth once a week 12 capsule 3   colchicine  0.6 MG tablet Take 2 tablets by mouth for the first dose then take one tablet by mouth an hour later then take one tablet by mouth daily until pain free 90 tablet 0   ELIQUIS 2.5 MG TABS tablet Take 1 tablet by mouth twice daily 60  tablet 0   empagliflozin  (JARDIANCE ) 10 MG TABS tablet TAKE 1 TABLET BY MOUTH ONCE DAILY BEFORE BREAKFAST 90 tablet 1   ezetimibe (ZETIA) 10 MG tablet Take 1 tablet by mouth once daily 90 tablet 1   fluticasone  (FLONASE ) 50 MCG/ACT nasal spray Place 1 spray into both nostrils daily as needed for rhinitis. 48 mL 0   folic acid  (FOLVITE ) 1 MG tablet Take 1 mg by mouth daily.     furosemide  (LASIX ) 20 MG tablet Take 1 tablet (20 mg total) by mouth daily. 30 tablet 11   gabapentin  (NEURONTIN ) 400 MG capsule Take 1 capsule (400 mg total) by mouth 3 (three) times daily. 270 capsule 1   HUMIRA PEN 40 MG/0.4ML PNKT Inject 40 mg into the skin every 14 (fourteen) days.     KERENDIA  10 MG TABS Take 1 tablet by mouth once daily 90 tablet 0   methotrexate  (RHEUMATREX) 2.5 MG tablet Take 10 mg by mouth once a week.     metoprolol succinate (TOPROL-XL) 25 MG 24 hr tablet Take 1 tablet by mouth once daily 90 tablet 0   mupirocin  ointment (BACTROBAN ) 2 % APPLY TO RIGHT GREAT TOE ONCE DAILY UNTIL HEALED 66 g 0   pantoprazole  (PROTONIX ) 20 MG tablet Take 1  tablet by mouth once daily 90 tablet 0   tamsulosin  (FLOMAX ) 0.4 MG CAPS capsule Take 1 capsule (0.4 mg total) by mouth daily. 90 capsule 3   traMADol -acetaminophen  (ULTRACET ) 37.5-325 MG tablet Take 1 tablet by mouth every 6 (six) hours as needed. for pain 30 tablet 0   triamcinolone  cream (KENALOG ) 0.1 % Apply 1 Application topically 2 (two) times daily. to affected area     No current facility-administered medications for this visit.      SABRA  PHYSICAL EXAMINATION: ECOG PERFORMANCE STATUS: 1 - Symptomatic but completely ambulatory Vitals:   06/21/24 1046  BP: 119/80  Pulse: 93  Resp: 18  Temp: (!) 96 F (35.6 C)  SpO2: 99%   Filed Weights   06/21/24 1046  Weight: 221 lb 9.6 oz (100.5 kg)   Physical Exam Constitutional:      General: He is not in acute distress.    Appearance: He is not diaphoretic.  HENT:     Head: Normocephalic and atraumatic.     Mouth/Throat:     Pharynx: No oropharyngeal exudate.  Eyes:     General: No scleral icterus.       Right eye: No discharge.        Left eye: No discharge.     Pupils: Pupils are equal, round, and reactive to light.  Neck:     Vascular: No JVD.  Cardiovascular:     Rate and Rhythm: Normal rate and regular rhythm.     Heart sounds: Normal heart sounds. No murmur heard. Pulmonary:     Effort: Pulmonary effort is normal. No respiratory distress.     Breath sounds: Normal breath sounds.  Abdominal:     General: Bowel sounds are normal. There is no distension.     Palpations: Abdomen is soft. There is no mass.     Tenderness: There is no abdominal tenderness.  Musculoskeletal:        General: No tenderness. Normal range of motion.     Cervical back: Normal range of motion and neck supple.  Lymphadenopathy:     Cervical: No cervical adenopathy.  Skin:    General: Skin is warm and dry.     Findings: No erythema.  Comments: Hypopigmentation of skin  Neurological:     Mental Status: He is alert and oriented to person,  place, and time.     Cranial Nerves: No cranial nerve deficit.  Psychiatric:        Mood and Affect: Affect normal.     LABORATORY DATA:  I have reviewed the data as listed    Latest Ref Rng & Units 06/12/2024   10:56 AM 11/23/2023    7:55 AM 10/26/2023   10:37 AM  CBC  WBC 4.0 - 10.5 K/uL 8.2  9.0  7.0   Hemoglobin 13.0 - 17.0 g/dL 84.9  83.9  85.0   Hematocrit 39.0 - 52.0 % 48.2  50.7  46.8   Platelets 150 - 400 K/uL 226  243  165       Latest Ref Rng & Units 06/12/2024   10:57 AM 01/19/2024    1:34 PM 10/26/2023   10:37 AM  CMP  Glucose 70 - 99 mg/dL 862  879  92   BUN 8 - 23 mg/dL 19  20  27    Creatinine 0.61 - 1.24 mg/dL 8.06  7.78  8.04   Sodium 135 - 145 mmol/L 139  139  134   Potassium 3.5 - 5.1 mmol/L 4.4  4.7  4.7   Chloride 98 - 111 mmol/L 105  105  106   CO2 22 - 32 mmol/L 23  19  21    Calcium  8.9 - 10.3 mg/dL 9.8  9.5  8.7   Total Protein 6.5 - 8.1 g/dL 8.0  7.1  7.9   Total Bilirubin 0.0 - 1.2 mg/dL 0.3  0.4  0.8   Alkaline Phos 38 - 126 U/L 103  81  65   AST 15 - 41 U/L 21  17  18    ALT 0 - 44 U/L 22  14  14       Bone marrow biopsy 03/21/2017  Bone Marrow, Aspirate,Biopsy, and Clot, left iliac - HYPERCELLULAR BONE MARROW FOR AGE WITH TRILINEAGE HEMATOPOIESIS. - PLASMACYTOSIS (PLASMA CELLS 8%)  Karyotype: loss of Y chromosome Cytogenetic MDS FISH Panel negative.   Bone marrow 04/06/2017  Bone Marrow, Aspirate,Biopsy, and Clot, right iliac and core - HYPERCELLULAR BONE MARROW FOR AGE WITH PLASMA CELL NEOPLASM - TRILINEAGE HEMATOPOIESIS. - SEE COMMENT. PERIPHERAL BLOOD: - MICROCYTIC-HYPOCHROMIC ANEMIA. Diagnosis Note The bone marrow is hypercellular with trilineage hematopoiesis but with relative abundance of erythroid precursors and increased number of megakaryocytes with nonspecific changes. Significant dyspoiesis is not seen. Iron  stores are present with no ring sideroblasts. The plasma cells are increased in number representing 8% of all cells in  the aspirate although focal areas in the core biopsy show 10 to 20% as primarily seen by CD138 stain. In situ hybridization for kappa and lambda light chains show kappa light chain restriction consistent with plasma cell neoplasm. Correlation with cytogenetic and FISH studies is recommended. (BNS:ecj/gt 04/07/2017)  IMAGE STUDIES I have personally reviewed below image results.  03/15/2017 DG bone survey Met: Nonspecific lucent lesion in the distal right ulna measuring 15-16 mm, but otherwise normal bone mineralization for age throughout the visible skeleton. A solitary lytic lesion in a distal extremity would be an unusual presentation of multiple myeloma, and I favor the distal right ulna lesion is benign. Recommend correlation with serum and urine protein electrophoresis.  04/14/2017 PET scan: 1. 2.1 cm hypermetabolic soft tissue lesion in the left heel, adjacent to the calcaneal tuberosity. 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 2. Mottled FDG  accumulation diffusely in the marrow space without other frankly overt hypermetabolic bony lesion. 3. Coronary artery and thoracoabdominal aortic atherosclerosis with abdominal aortic stent graft visualized in situ. 4. Bilateral renal cysts and bilateral nonobstructing renal stones.   08/24/2017 PET scan 1. No hypermetabolic osseous lesions. No definite signs of multiple myeloma on CT imaging. 2. Interval improvement in soft tissue activity along the medial aspect of the left calcaneal tuberosity, likely plantar fasciitis. 3. New small left pleural effusion. 4. Bilateral inguinal hernias containing small bowel on the right and sigmoid colon on the left. No evidence of incarceration or obstruction. 5. Otherwise stable incidental findings including diffuse atherosclerosis, bilateral renal cysts, emphysema and sigmoid diverticulosis. "

## 2024-06-21 NOTE — Assessment & Plan Note (Signed)
 Repeat CT chest in Sept 2024 showed stable right upper lobe and right lower lobe lung nodules Repeat CT in Sept 2025

## 2024-06-21 NOTE — Assessment & Plan Note (Addendum)
 Smoldering kappa light chain multiple myeloma/active myeloma if previous hypermetabolic lesion on PET scan was related to myeloma.Patient was treated with 4 cycles of RVD.  He is not currently on any maintenance therapy due to multiple comorbidities, borderline smoldering multiple myeloma status.    Lab Results  Component Value Date   MPROTEIN Not Observed 06/12/2024   KPAFRELGTCHN 223.7 (H) 06/12/2024   LAMBDASER 39.9 (H) 06/12/2024   KAPLAMBRATIO 5.61 (H) 06/12/2024    CKD, creatinine relatively stable.  No anemia.  No hypercalcemia. slow progressing light chain ratio. 24 hour UPEP M protein 26mg , negative skeletal survey 11/23/2023 bone marrow biopsy showed 30% cellularity with no increase of plasma cells. Kappa/lambda ISH is polyclonal.  Cytogenetics normal. Continue observation.  Follow-up in 4 months

## 2024-06-21 NOTE — Assessment & Plan Note (Addendum)
 status post IMRT in 2015.  PSA is stable.  He follows up with urology annually

## 2024-06-25 ENCOUNTER — Encounter: Payer: Self-pay | Admitting: Cardiovascular Disease

## 2024-06-25 ENCOUNTER — Ambulatory Visit: Admitting: Cardiovascular Disease

## 2024-06-25 VITALS — BP 120/82 | HR 62 | Ht 71.0 in | Wt 222.6 lb

## 2024-06-25 DIAGNOSIS — I480 Paroxysmal atrial fibrillation: Secondary | ICD-10-CM | POA: Diagnosis not present

## 2024-06-25 DIAGNOSIS — I5031 Acute diastolic (congestive) heart failure: Secondary | ICD-10-CM

## 2024-06-25 DIAGNOSIS — F17210 Nicotine dependence, cigarettes, uncomplicated: Secondary | ICD-10-CM | POA: Diagnosis not present

## 2024-06-25 DIAGNOSIS — N1832 Chronic kidney disease, stage 3b: Secondary | ICD-10-CM

## 2024-06-25 DIAGNOSIS — E782 Mixed hyperlipidemia: Secondary | ICD-10-CM

## 2024-06-25 DIAGNOSIS — I25118 Atherosclerotic heart disease of native coronary artery with other forms of angina pectoris: Secondary | ICD-10-CM

## 2024-06-25 DIAGNOSIS — R0602 Shortness of breath: Secondary | ICD-10-CM | POA: Diagnosis not present

## 2024-06-25 NOTE — Progress Notes (Signed)
 "     Cardiology Office Note   Date:  06/25/2024   ID:  Carrol, Bondar 11-16-1940, MRN [id number]  PCP:  Albina GORMAN Dine, MD  Cardiologist:  Denyse Bathe, MD      History of Present Illness: Omar [name] is a 84 y.o. male who presents for  Chief Complaint  Patient presents with   Follow-up    4 week follow up    No chest pain, doing better.      Past Medical History:  Diagnosis Date   AAA (abdominal aortic aneurysm)    a.) s/p EVAR 11/05/2013.   Anxiety    a.) on BZO (alprazolam ) PRN   Aortic atherosclerosis    Atrial fibrillation (HCC)    a.) CHA2DS2-VASc = 6 (age x2, CHF, HTN, PVD/aortic plaque, T2DM). b.) rate/rhythm maintained without use of pharmacological intervention; chronically anticoagulated using dose reduced apixaban.   BPH (benign prostatic hyperplasia)    CHF (congestive heart failure) (HCC)    a.)  TTE 03/16/2021: EF 63%; moderate LVH; mild BAE mild to moderate pan valvular regurgitation; G1DD.   CKD (chronic kidney disease), stage III (HCC)    Colon polyp 07/07/2015   TUBULAR ADENOMA WITH AT LEAST HIGH-GRADE / Dr Jeri   COPD (chronic obstructive pulmonary disease) (HCC)    Coronary artery disease    a.) coronary CTA 01/10/2015 CA score 722.3; mod-sev LAD disease; mild LCx disease; 50-60% RCA disease.   Current use of long term anticoagulation    a.) dose reduced apixaban   Diverticulosis    Erectile dysfunction    GERD (gastroesophageal reflux disease)    Gout    Grade I diastolic dysfunction    Heart murmur    Hepatic cyst    Hepatic steatosis    Hepatitis    Hypercholesterolemia    Hypertension    Iron  deficiency anemia    Migraines    Moderate tricuspid regurgitation by prior echocardiogram    Multiple myeloma (HCC)    Nephrolithiasis    OSA on CPAP    Presence of permanent cardiac pacemaker    Prostate cancer (HCC) 01/01/13, 01/30/14   Gleason 3+4=7, volume 46.6 cc   PVD (peripheral vascular disease)    Renal cyst     Rheumatoid arthritis (HCC)    S/P radiation therapy  04/03/2014 through 06/04/2014                                                      Prostate 7800 cGy in 40 sessions                           SSS (sick sinus syndrome) (HCC)    a.) s/p Medtronic Azure PPM placement 08/26/2016   T2DM (type 2 diabetes mellitus) (HCC)    Third degree heart block (HCC) 08/26/2016   a.) s/p PPM placement (Medtronic Azure XT DR MRI [id number] S/N: [id number]) on 08/26/2016   Vitamin D deficiency      Past Surgical History:  Procedure Laterality Date   ABDOMINAL AORTIC ANEURYSM REPAIR  11/05/2013   CATARACT EXTRACTION W/PHACO Left 06/22/2023   Procedure: CATARACT EXTRACTION PHACO AND INTRAOCULAR LENS PLACEMENT (IOC) LEFT  CLAREON VIVITY TORIC  17.82  01:15.5;  Surgeon: Mittie Gaskin, MD;  Location: [facility];  Service: Ophthalmology;  Laterality: Left;   CATARACT EXTRACTION W/PHACO Right 07/06/2023   Procedure: CATARACT EXTRACTION PHACO AND INTRAOCULAR LENS PLACEMENT (IOC) RIGHT DIABETIC  CLAREON VIVITY TORIC;  Surgeon: Mittie Gaskin, MD;  Location: [facility];  Service: Ophthalmology;  Laterality: Right;  17.01 1:01.9   COLON SURGERY  08/06/2015   Right hemicolectomy for tubulovillous adenoma with high-grade dysplasia.   COLONOSCOPY WITH PROPOFOL  N/A 07/07/2015   Procedure: COLONOSCOPY WITH PROPOFOL ;  Surgeon: Donnice Vaughn Manes, MD;  Location: [facility] ENDOSCOPY;  Service: Endoscopy;  Laterality: N/A;   COLONOSCOPY WITH PROPOFOL  N/A 10/28/2016   Procedure: COLONOSCOPY WITH PROPOFOL ;  Surgeon: Therisa Bi, MD;  Location: [facility] ENDOSCOPY;  Service: Endoscopy;  Laterality: N/A;   CYSTOSCOPY W/ RETROGRADES Bilateral 11/16/2021   Procedure: CYSTOSCOPY WITH RETROGRADE PYELOGRAM;  Surgeon: Penne Knee, MD;  Location: [facility];  Service: Urology;  Laterality: Bilateral;   CYSTOSCOPY WITH BIOPSY N/A 11/16/2021   Procedure: CYSTOSCOPY WITH BLADDER BIOPSY;  Surgeon: Penne Knee, MD;   Location: [facility];  Service: Urology;  Laterality: N/A;   ESOPHAGOGASTRODUODENOSCOPY (EGD) WITH PROPOFOL  N/A 10/28/2016   Procedure: ESOPHAGOGASTRODUODENOSCOPY (EGD) WITH PROPOFOL ;  Surgeon: Therisa Bi, MD;  Location: [facility] ENDOSCOPY;  Service: Endoscopy;  Laterality: N/A;   GIVENS CAPSULE STUDY N/A 07/25/2017   Procedure: GIVENS CAPSULE STUDY;  Surgeon: Therisa Bi, MD;  Location: [facility] ENDOSCOPY;  Service: Gastroenterology;  Laterality: N/A;   GIVENS CAPSULE STUDY N/A 01/29/2019   Procedure: GIVENS CAPSULE STUDY;  Surgeon: Therisa Bi, MD;  Location: [facility] ENDOSCOPY;  Service: Gastroenterology;  Laterality: N/A;   IR BONE MARROW BIOPSY & ASPIRATION  11/23/2023   LAPAROSCOPIC RIGHT COLECTOMY Right 08/08/2015   Procedure: LAPAROSCOPIC RIGHT COLECTOMY;  Surgeon: Reyes LELON Cota, MD;  Location: [facility];  Service: General;  Laterality: Right;   NASAL SINUS SURGERY     PACEMAKER INSERTION Left 08/26/2016   Procedure: INSERTION PACEMAKER;  Surgeon: Marsa Dooms, MD;  Location: [facility];  Service: Cardiovascular;  Laterality: Left;   PROSTATE BIOPSY  01/01/13, 01/30/14   Gleason 3+3=6, vol 46.6 cc   TONSILLECTOMY     uvula surgery     for sleep apnea     Current Outpatient Medications  Medication Sig Dispense Refill   albuterol  (VENTOLIN  HFA) 108 (90 Base) MCG/ACT inhaler INHALE 1 PUFF BY MOUTH EVERY 6 HOURS AS NEEDED 9 g 2   allopurinol  (ZYLOPRIM ) 100 MG tablet Take 1 tablet by mouth once daily 90 tablet 0   ALPRAZolam  (XANAX ) 0.25 MG tablet Take by mouth 2 (two) times daily as needed.     atorvastatin  (LIPITOR) 80 MG tablet Take 1 tablet (80 mg total) by mouth at bedtime. 90 tablet 0   Azelastine  HCl 137 MCG/SPRAY SOLN Place 1 spray into the nose daily. 30 mL 2   BREO ELLIPTA  100-25 MCG/ACT AEPB Inhale 1 puff into the lungs daily. 30 each 3   cetirizine  (ZYRTEC  ALLERGY) 10 MG tablet Take 1 tablet (10 mg total) by mouth daily. 30 tablet 2   Cholecalciferol (VITAMIN D3) 1.25 MG (50000  UT) CAPS Take 1 capsule by mouth once a week 12 capsule 3   colchicine  0.6 MG tablet Take 2 tablets by mouth for the first dose then take one tablet by mouth an hour later then take one tablet by mouth daily until pain free 90 tablet 0   ELIQUIS 2.5 MG TABS tablet Take 1 tablet by mouth twice daily 60 tablet 0   empagliflozin  (JARDIANCE ) 10 MG TABS tablet TAKE 1 TABLET BY MOUTH ONCE DAILY  BEFORE BREAKFAST 90 tablet 1   ezetimibe (ZETIA) 10 MG tablet Take 1 tablet by mouth once daily 90 tablet 1   fluticasone  (FLONASE ) 50 MCG/ACT nasal spray Place 1 spray into both nostrils daily as needed for rhinitis. 48 mL 0   folic acid  (FOLVITE ) 1 MG tablet Take 1 mg by mouth daily.     furosemide  (LASIX ) 20 MG tablet Take 1 tablet (20 mg total) by mouth daily. 30 tablet 11   gabapentin  (NEURONTIN ) 400 MG capsule Take 1 capsule (400 mg total) by mouth 3 (three) times daily. 270 capsule 1   HUMIRA PEN 40 MG/0.4ML PNKT Inject 40 mg into the skin every 14 (fourteen) days.     KERENDIA  10 MG TABS Take 1 tablet by mouth once daily 90 tablet 0   methotrexate  (RHEUMATREX) 2.5 MG tablet Take 10 mg by mouth once a week.     metoprolol succinate (TOPROL-XL) 25 MG 24 hr tablet Take 1 tablet by mouth once daily 90 tablet 0   mupirocin  ointment (BACTROBAN ) 2 % APPLY TO RIGHT GREAT TOE ONCE DAILY UNTIL HEALED 66 g 0   pantoprazole  (PROTONIX ) 20 MG tablet Take 1 tablet by mouth once daily 90 tablet 0   tamsulosin  (FLOMAX ) 0.4 MG CAPS capsule Take 1 capsule (0.4 mg total) by mouth daily. 90 capsule 3   traMADol -acetaminophen  (ULTRACET ) 37.5-325 MG tablet Take 1 tablet by mouth every 6 (six) hours as needed. for pain 30 tablet 0   triamcinolone  cream (KENALOG ) 0.1 % Apply 1 Application topically 2 (two) times daily. to affected area     No current facility-administered medications for this visit.    Allergies:   Patient has no known allergies.    Social History:   reports that he has been smoking cigarettes. He started  smoking about 62 years ago. He has a 62 pack-year smoking history. He has never used smokeless tobacco. He reports current alcohol use of about 1.0 standard drink of alcohol per week. He reports that he does not use drugs.   Family History:  family history includes Alzheimer's disease in his sister; Cancer in his daughter; Cirrhosis in his father; Diabetes in his brother and daughter; Heart attack in his mother; Pancreatic cancer in his brother.    ROS:     Review of Systems  Constitutional: Negative.   HENT: Negative.    Eyes: Negative.   Respiratory: Negative.    Gastrointestinal: Negative.   Genitourinary: Negative.   Musculoskeletal: Negative.   Skin: Negative.   Neurological: Negative.   Endo/Heme/Allergies: Negative.   Psychiatric/Behavioral: Negative.    All other systems reviewed and are negative.     All other systems are reviewed and negative.    PHYSICAL EXAM: VS:  BP 120/82   Pulse 62   Ht 5' 11 (1.803 m)   Wt 222 lb 9.6 oz (101 kg)   SpO2 93%   BMI 31.05 kg/m  , BMI Body mass index is 31.05 kg/m. Last weight:  Wt Readings from Last 3 Encounters:  06/25/24 222 lb 9.6 oz (101 kg)  06/21/24 221 lb 9.6 oz (100.5 kg)  06/06/24 223 lb (101.2 kg)     Physical Exam Vitals reviewed.  Constitutional:      Appearance: Normal appearance. He is normal weight.  HENT:     Head: Normocephalic.     Nose: Nose normal.     Mouth/Throat:     Mouth: Mucous membranes are moist.  Eyes:     Pupils: Pupils  are equal, round, and reactive to light.  Cardiovascular:     Rate and Rhythm: Normal rate and regular rhythm.     Pulses: Normal pulses.     Heart sounds: Normal heart sounds.  Pulmonary:     Effort: Pulmonary effort is normal.  Abdominal:     General: Abdomen is flat. Bowel sounds are normal.  Musculoskeletal:        General: Normal range of motion.     Cervical back: Normal range of motion.  Skin:    General: Skin is warm.  Neurological:     General: No  focal deficit present.     Mental Status: He is alert.  Psychiatric:        Mood and Affect: Mood normal.       EKG:   Recent Labs: 06/12/2024: ALT 22; BUN 19; Creatinine 1.93; Hemoglobin 15.0; Platelet Count 226; Potassium 4.4; Sodium 139    Lipid Panel    Component Value Date/Time   CHOL 121 05/07/2024 1219   TRIG 68 05/07/2024 1219   HDL 48 05/07/2024 1219   CHOLHDL 2.5 05/07/2024 1219   CHOLHDL 3.4 08/22/2016 0447   VLDL 13 08/22/2016 0447   LDLCALC 59 05/07/2024 1219      Other studies Reviewed: Additional studies/ records that were reviewed today include:  Review of the above records demonstrates:       No data to display            ASSESSMENT AND PLAN:    ICD-10-CM   1. Acute diastolic CHF (congestive heart failure) (HCC)  I50.31    Taking jaudiance, doing better.    2. Coronary artery disease of native artery of native heart with stable angina pectoris  I25.118     3. CKD stage 3b, GFR 30-44 ml/min (HCC)  N18.32     4. Shortness of breath  R06.02     5. Mixed hyperlipidemia  E78.2     6. Paroxysmal atrial fibrillation (HCC)  I48.0        Problem List Items Addressed This Visit       Cardiovascular and Mediastinum   Atrial fibrillation (HCC)   Acute diastolic CHF (congestive heart failure) (HCC) - Primary     Other   HLD (hyperlipidemia)   Shortness of breath   Other Visit Diagnoses       Coronary artery disease of native artery of native heart with stable angina pectoris         CKD stage 3b, GFR 30-44 ml/min (HCC)              Disposition:   Return in about 3 months (around 09/23/2024).    Total time spent: 30 minutes  Signed,  Denyse Bathe, MD  06/25/2024 11:33 AM    [facility] "

## 2024-06-28 ENCOUNTER — Other Ambulatory Visit: Payer: Self-pay | Admitting: Cardiovascular Disease

## 2024-06-29 ENCOUNTER — Other Ambulatory Visit (INDEPENDENT_AMBULATORY_CARE_PROVIDER_SITE_OTHER): Payer: Self-pay | Admitting: Nurse Practitioner

## 2024-06-29 DIAGNOSIS — N186 End stage renal disease: Secondary | ICD-10-CM

## 2024-06-30 ENCOUNTER — Other Ambulatory Visit: Payer: Self-pay | Admitting: Internal Medicine

## 2024-06-30 DIAGNOSIS — K219 Gastro-esophageal reflux disease without esophagitis: Secondary | ICD-10-CM

## 2024-06-30 DIAGNOSIS — M1A40X0 Other secondary chronic gout, unspecified site, without tophus (tophi): Secondary | ICD-10-CM

## 2024-07-03 ENCOUNTER — Ambulatory Visit (INDEPENDENT_AMBULATORY_CARE_PROVIDER_SITE_OTHER): Payer: Medicare PPO | Admitting: Vascular Surgery

## 2024-07-03 ENCOUNTER — Other Ambulatory Visit (INDEPENDENT_AMBULATORY_CARE_PROVIDER_SITE_OTHER): Payer: Medicare PPO

## 2024-07-23 ENCOUNTER — Ambulatory Visit: Admitting: Podiatry

## 2024-08-08 ENCOUNTER — Ambulatory Visit: Admitting: Internal Medicine

## 2024-09-25 ENCOUNTER — Ambulatory Visit: Admitting: Cardiovascular Disease

## 2024-10-12 ENCOUNTER — Other Ambulatory Visit

## 2024-10-19 ENCOUNTER — Ambulatory Visit: Admitting: Urology

## 2024-10-22 ENCOUNTER — Inpatient Hospital Stay

## 2024-11-05 ENCOUNTER — Inpatient Hospital Stay: Admitting: Oncology
